# Patient Record
Sex: Female | Born: 1960 | Race: Black or African American | Hispanic: No | Marital: Single | Smoking: Former smoker
Health system: Southern US, Community
[De-identification: ages and names within clinical notes are randomized; demographics above are authoritative.]

## PROBLEM LIST (undated history)

## (undated) DIAGNOSIS — F410 Panic disorder [episodic paroxysmal anxiety] without agoraphobia: Secondary | ICD-10-CM

## (undated) DIAGNOSIS — Z923 Personal history of irradiation: Secondary | ICD-10-CM

## (undated) DIAGNOSIS — E119 Type 2 diabetes mellitus without complications: Secondary | ICD-10-CM

## (undated) DIAGNOSIS — M1990 Unspecified osteoarthritis, unspecified site: Secondary | ICD-10-CM

## (undated) DIAGNOSIS — F419 Anxiety disorder, unspecified: Secondary | ICD-10-CM

## (undated) DIAGNOSIS — D649 Anemia, unspecified: Secondary | ICD-10-CM

## (undated) DIAGNOSIS — K9089 Other intestinal malabsorption: Secondary | ICD-10-CM

## (undated) DIAGNOSIS — Z87442 Personal history of urinary calculi: Secondary | ICD-10-CM

## (undated) DIAGNOSIS — R001 Bradycardia, unspecified: Secondary | ICD-10-CM

## (undated) DIAGNOSIS — M5416 Radiculopathy, lumbar region: Secondary | ICD-10-CM

## (undated) DIAGNOSIS — M170 Bilateral primary osteoarthritis of knee: Secondary | ICD-10-CM

## (undated) DIAGNOSIS — J45909 Unspecified asthma, uncomplicated: Secondary | ICD-10-CM

## (undated) DIAGNOSIS — G5601 Carpal tunnel syndrome, right upper limb: Secondary | ICD-10-CM

## (undated) DIAGNOSIS — F32A Depression, unspecified: Secondary | ICD-10-CM

## (undated) DIAGNOSIS — K219 Gastro-esophageal reflux disease without esophagitis: Secondary | ICD-10-CM

## (undated) DIAGNOSIS — R51 Headache: Secondary | ICD-10-CM

## (undated) DIAGNOSIS — Z9221 Personal history of antineoplastic chemotherapy: Secondary | ICD-10-CM

## (undated) DIAGNOSIS — G8929 Other chronic pain: Secondary | ICD-10-CM

## (undated) DIAGNOSIS — F329 Major depressive disorder, single episode, unspecified: Secondary | ICD-10-CM

## (undated) DIAGNOSIS — K5792 Diverticulitis of intestine, part unspecified, without perforation or abscess without bleeding: Secondary | ICD-10-CM

## (undated) DIAGNOSIS — R519 Headache, unspecified: Secondary | ICD-10-CM

## (undated) DIAGNOSIS — M542 Cervicalgia: Secondary | ICD-10-CM

## (undated) DIAGNOSIS — K909 Intestinal malabsorption, unspecified: Secondary | ICD-10-CM

## (undated) DIAGNOSIS — M79643 Pain in unspecified hand: Secondary | ICD-10-CM

## (undated) HISTORY — DX: Depression, unspecified: F32.A

## (undated) HISTORY — DX: Pain in unspecified hand: M79.643

## (undated) HISTORY — DX: Major depressive disorder, single episode, unspecified: F32.9

## (undated) HISTORY — DX: Type 2 diabetes mellitus without complications: E11.9

## (undated) HISTORY — DX: Unspecified asthma, uncomplicated: J45.909

## (undated) HISTORY — DX: Diverticulitis of intestine, part unspecified, without perforation or abscess without bleeding: K57.92

## (undated) HISTORY — DX: Other intestinal malabsorption: K90.89

## (undated) HISTORY — PX: PARTIAL HYSTERECTOMY: SHX80

## (undated) HISTORY — DX: Gastro-esophageal reflux disease without esophagitis: K21.9

## (undated) HISTORY — PX: TUBAL LIGATION: SHX77

## (undated) HISTORY — DX: Intestinal malabsorption, unspecified: K90.9

---

## 2003-11-03 ENCOUNTER — Emergency Department: Payer: Self-pay | Admitting: Unknown Physician Specialty

## 2003-11-04 ENCOUNTER — Emergency Department: Payer: Self-pay | Admitting: Emergency Medicine

## 2004-03-13 ENCOUNTER — Emergency Department: Payer: Self-pay | Admitting: Internal Medicine

## 2004-04-09 ENCOUNTER — Emergency Department: Payer: Self-pay | Admitting: Emergency Medicine

## 2004-10-14 ENCOUNTER — Emergency Department: Payer: Self-pay | Admitting: Emergency Medicine

## 2004-11-18 ENCOUNTER — Emergency Department: Payer: Self-pay | Admitting: Unknown Physician Specialty

## 2004-12-06 ENCOUNTER — Emergency Department: Payer: Self-pay | Admitting: Emergency Medicine

## 2005-02-23 ENCOUNTER — Emergency Department: Payer: Self-pay | Admitting: Emergency Medicine

## 2005-04-21 ENCOUNTER — Emergency Department: Payer: Self-pay | Admitting: Emergency Medicine

## 2005-07-14 ENCOUNTER — Emergency Department: Payer: Self-pay | Admitting: Emergency Medicine

## 2005-07-20 ENCOUNTER — Ambulatory Visit: Payer: Self-pay | Admitting: Nurse Practitioner

## 2005-09-24 ENCOUNTER — Emergency Department: Payer: Self-pay

## 2005-12-14 ENCOUNTER — Emergency Department: Payer: Self-pay | Admitting: Internal Medicine

## 2005-12-14 ENCOUNTER — Other Ambulatory Visit: Payer: Self-pay

## 2005-12-16 ENCOUNTER — Ambulatory Visit: Payer: Self-pay | Admitting: Internal Medicine

## 2005-12-17 ENCOUNTER — Other Ambulatory Visit: Payer: Self-pay

## 2005-12-17 ENCOUNTER — Emergency Department: Payer: Self-pay | Admitting: Emergency Medicine

## 2006-11-26 ENCOUNTER — Ambulatory Visit: Payer: Self-pay | Admitting: Gastroenterology

## 2006-12-10 ENCOUNTER — Ambulatory Visit: Payer: Self-pay | Admitting: Unknown Physician Specialty

## 2007-01-18 ENCOUNTER — Ambulatory Visit: Payer: Self-pay | Admitting: Obstetrics and Gynecology

## 2007-01-31 ENCOUNTER — Inpatient Hospital Stay: Payer: Self-pay | Admitting: Obstetrics and Gynecology

## 2007-07-02 ENCOUNTER — Emergency Department: Payer: Self-pay | Admitting: Emergency Medicine

## 2007-08-20 ENCOUNTER — Emergency Department: Payer: Self-pay | Admitting: Emergency Medicine

## 2007-08-23 ENCOUNTER — Emergency Department: Payer: Self-pay | Admitting: Internal Medicine

## 2007-10-06 ENCOUNTER — Ambulatory Visit: Payer: Self-pay | Admitting: Internal Medicine

## 2007-10-06 ENCOUNTER — Emergency Department: Payer: Self-pay | Admitting: Emergency Medicine

## 2007-10-12 ENCOUNTER — Emergency Department: Payer: Self-pay | Admitting: Internal Medicine

## 2008-06-24 ENCOUNTER — Emergency Department: Payer: Self-pay | Admitting: Emergency Medicine

## 2008-07-19 ENCOUNTER — Emergency Department: Payer: Self-pay | Admitting: Emergency Medicine

## 2008-07-21 ENCOUNTER — Emergency Department: Payer: Self-pay | Admitting: Emergency Medicine

## 2009-01-07 ENCOUNTER — Emergency Department: Payer: Self-pay | Admitting: Emergency Medicine

## 2009-03-01 ENCOUNTER — Emergency Department: Payer: Self-pay | Admitting: Emergency Medicine

## 2009-07-27 ENCOUNTER — Emergency Department: Payer: Self-pay | Admitting: Emergency Medicine

## 2009-08-08 ENCOUNTER — Emergency Department: Payer: Self-pay | Admitting: Emergency Medicine

## 2009-11-06 ENCOUNTER — Emergency Department: Payer: Self-pay | Admitting: Emergency Medicine

## 2010-01-27 ENCOUNTER — Emergency Department: Payer: Self-pay | Admitting: Emergency Medicine

## 2010-03-26 ENCOUNTER — Emergency Department: Payer: Self-pay | Admitting: Emergency Medicine

## 2010-04-05 ENCOUNTER — Emergency Department: Payer: Self-pay | Admitting: Emergency Medicine

## 2010-04-19 ENCOUNTER — Emergency Department: Payer: Self-pay | Admitting: Unknown Physician Specialty

## 2010-08-10 ENCOUNTER — Emergency Department: Payer: Self-pay | Admitting: Unknown Physician Specialty

## 2010-10-06 ENCOUNTER — Emergency Department: Payer: Self-pay | Admitting: Unknown Physician Specialty

## 2010-11-16 ENCOUNTER — Emergency Department: Payer: Self-pay | Admitting: Unknown Physician Specialty

## 2011-04-27 ENCOUNTER — Emergency Department: Payer: Self-pay | Admitting: Emergency Medicine

## 2011-04-27 LAB — COMPREHENSIVE METABOLIC PANEL
Albumin: 3.4 g/dL (ref 3.4–5.0)
Alkaline Phosphatase: 87 U/L (ref 50–136)
Anion Gap: 9 (ref 7–16)
BUN: 11 mg/dL (ref 7–18)
Co2: 26 mmol/L (ref 21–32)
Creatinine: 0.78 mg/dL (ref 0.60–1.30)
EGFR (African American): >60
EGFR (Non-African Amer.): >60
Potassium: 4.4 mmol/L (ref 3.5–5.1)
SGPT (ALT): 18 U/L
Sodium: 142 mmol/L (ref 136–145)
Total Protein: 7.7 g/dL (ref 6.4–8.2)

## 2011-04-27 LAB — CBC
HCT: 37.5 % (ref 35.0–47.0)
HGB: 12.2 g/dL (ref 12.0–16.0)
MCH: 25.7 pg — ABNORMAL LOW (ref 26.0–34.0)
MCV: 79 fL — ABNORMAL LOW (ref 80–100)
RBC: 4.73 10*6/uL (ref 3.80–5.20)
RDW: 13.8 % (ref 11.5–14.5)
WBC: 5.5 10*3/uL (ref 3.6–11.0)

## 2011-04-27 LAB — URINALYSIS, COMPLETE
Bilirubin,UR: NEGATIVE
Nitrite: NEGATIVE
RBC,UR: 1 /HPF (ref 0–5)
Specific Gravity: 1.011 (ref 1.003–1.030)
Squamous Epithelial: 21
WBC UR: 7 /HPF (ref 0–5)

## 2011-04-27 LAB — WET PREP, GENITAL

## 2011-04-28 ENCOUNTER — Emergency Department: Payer: Self-pay | Admitting: Emergency Medicine

## 2011-04-28 LAB — URINALYSIS, COMPLETE
Bacteria: NONE SEEN
Bilirubin,UR: NEGATIVE
Blood: NEGATIVE
Glucose,UR: NEGATIVE mg/dL (ref 0–75)
Nitrite: NEGATIVE
Ph: 5 (ref 4.5–8.0)
Protein: NEGATIVE
RBC,UR: 7 /HPF (ref 0–5)
Specific Gravity: 1.026 (ref 1.003–1.030)
WBC UR: 1 /HPF (ref 0–5)

## 2011-04-28 LAB — CBC
HCT: 36.2 % (ref 35.0–47.0)
HGB: 12.1 g/dL (ref 12.0–16.0)
MCH: 26.6 pg (ref 26.0–34.0)
MCHC: 33.5 g/dL (ref 32.0–36.0)
MCV: 80 fL (ref 80–100)
Platelet: 262 10*3/uL (ref 150–440)
RBC: 4.55 10*6/uL (ref 3.80–5.20)

## 2011-04-28 LAB — COMPREHENSIVE METABOLIC PANEL
Alkaline Phosphatase: 84 U/L (ref 50–136)
Calcium, Total: 8.2 mg/dL — ABNORMAL LOW (ref 8.5–10.1)
Chloride: 108 mmol/L — ABNORMAL HIGH (ref 98–107)
Creatinine: 0.69 mg/dL (ref 0.60–1.30)
Osmolality: 282 (ref 275–301)
Potassium: 3.9 mmol/L (ref 3.5–5.1)
SGOT(AST): 37 U/L (ref 15–37)
SGPT (ALT): 27 U/L

## 2011-05-01 ENCOUNTER — Emergency Department: Payer: Self-pay | Admitting: Emergency Medicine

## 2011-05-01 LAB — CBC
HCT: 33.7 % — ABNORMAL LOW (ref 35.0–47.0)
HGB: 11.2 g/dL — ABNORMAL LOW (ref 12.0–16.0)
MCH: 26.5 pg (ref 26.0–34.0)
MCHC: 33.3 g/dL (ref 32.0–36.0)
MCV: 79 fL — ABNORMAL LOW (ref 80–100)
RBC: 4.24 10*6/uL (ref 3.80–5.20)
RDW: 14.9 % — ABNORMAL HIGH (ref 11.5–14.5)
WBC: 6.6 10*3/uL (ref 3.6–11.0)

## 2011-05-01 LAB — BASIC METABOLIC PANEL
Anion Gap: 10 (ref 7–16)
BUN: 10 mg/dL (ref 7–18)
Calcium, Total: 8.4 mg/dL — ABNORMAL LOW (ref 8.5–10.1)
Chloride: 107 mmol/L (ref 98–107)
Co2: 25 mmol/L (ref 21–32)
Creatinine: 0.82 mg/dL (ref 0.60–1.30)
EGFR (Non-African Amer.): >60
Osmolality: 282 (ref 275–301)

## 2011-05-01 LAB — CK TOTAL AND CKMB (NOT AT ARMC)
CK, Total: 242 U/L — ABNORMAL HIGH (ref 21–215)
CK-MB: 1.5 ng/mL (ref 0.5–3.6)

## 2011-05-01 LAB — PRO B NATRIURETIC PEPTIDE: B-Type Natriuretic Peptide: 34 pg/mL (ref 0–125)

## 2011-08-07 ENCOUNTER — Emergency Department: Payer: Self-pay | Admitting: Emergency Medicine

## 2012-08-13 ENCOUNTER — Emergency Department: Payer: Self-pay | Admitting: Emergency Medicine

## 2012-09-12 ENCOUNTER — Emergency Department: Payer: Self-pay | Admitting: Emergency Medicine

## 2012-09-12 LAB — URINALYSIS, COMPLETE
Blood: NEGATIVE
Glucose,UR: NEGATIVE mg/dL (ref 0–75)
Ketone: NEGATIVE
Specific Gravity: 1.035 (ref 1.003–1.030)
WBC UR: 2 /HPF (ref 0–5)

## 2012-09-12 LAB — COMPREHENSIVE METABOLIC PANEL
BUN: 9 mg/dL (ref 7–18)
Bilirubin,Total: 0.2 mg/dL (ref 0.2–1.0)
Chloride: 108 mmol/L — ABNORMAL HIGH (ref 98–107)
EGFR (African American): >60
Glucose: 112 mg/dL — ABNORMAL HIGH (ref 65–99)
Potassium: 3.3 mmol/L — ABNORMAL LOW (ref 3.5–5.1)
SGOT(AST): 23 U/L (ref 15–37)
Sodium: 140 mmol/L (ref 136–145)

## 2012-09-12 LAB — CBC
HCT: 34.6 % — ABNORMAL LOW (ref 35.0–47.0)
HGB: 11.7 g/dL — ABNORMAL LOW (ref 12.0–16.0)
MCHC: 34 g/dL (ref 32.0–36.0)
MCV: 78 fL — ABNORMAL LOW (ref 80–100)
RBC: 4.42 10*6/uL (ref 3.80–5.20)
RDW: 14.4 % (ref 11.5–14.5)

## 2012-09-12 LAB — LIPASE, BLOOD: Lipase: 208 U/L (ref 73–393)

## 2012-11-09 ENCOUNTER — Ambulatory Visit: Payer: Self-pay | Admitting: Family Medicine

## 2012-12-01 ENCOUNTER — Emergency Department: Payer: Self-pay | Admitting: Emergency Medicine

## 2012-12-01 LAB — CBC
HGB: 12.2 g/dL (ref 12.0–16.0)
Platelet: 241 10*3/uL (ref 150–440)
RBC: 4.57 10*6/uL (ref 3.80–5.20)
RDW: 14.4 % (ref 11.5–14.5)
WBC: 6.1 10*3/uL (ref 3.6–11.0)

## 2012-12-01 LAB — BASIC METABOLIC PANEL
Anion Gap: 5 — ABNORMAL LOW (ref 7–16)
BUN: 11 mg/dL (ref 7–18)
Calcium, Total: 8.9 mg/dL (ref 8.5–10.1)
Chloride: 109 mmol/L — ABNORMAL HIGH (ref 98–107)
Co2: 26 mmol/L (ref 21–32)
EGFR (African American): >60
Osmolality: 279 (ref 275–301)
Potassium: 3.8 mmol/L (ref 3.5–5.1)
Sodium: 140 mmol/L (ref 136–145)

## 2012-12-01 LAB — CK TOTAL AND CKMB (NOT AT ARMC)
CK, Total: 176 U/L (ref 21–215)
CK-MB: 1.4 ng/mL (ref 0.5–3.6)

## 2012-12-01 LAB — TROPONIN I
Troponin-I: <0.02 ng/mL
Troponin-I: <0.02 ng/mL

## 2013-03-18 ENCOUNTER — Emergency Department: Payer: Self-pay | Admitting: Emergency Medicine

## 2013-03-20 LAB — BETA STREP CULTURE(ARMC)

## 2013-04-01 ENCOUNTER — Emergency Department: Payer: Self-pay | Admitting: Emergency Medicine

## 2013-08-05 ENCOUNTER — Emergency Department: Payer: Self-pay | Admitting: Emergency Medicine

## 2013-08-05 LAB — COMPREHENSIVE METABOLIC PANEL
ALBUMIN: 3.4 g/dL (ref 3.4–5.0)
ALK PHOS: 97 U/L
ALT: 24 U/L (ref 12–78)
Anion Gap: 7 (ref 7–16)
BUN: 7 mg/dL (ref 7–18)
Bilirubin,Total: 0.4 mg/dL (ref 0.2–1.0)
CALCIUM: 9 mg/dL (ref 8.5–10.1)
CHLORIDE: 107 mmol/L (ref 98–107)
CREATININE: 0.68 mg/dL (ref 0.60–1.30)
Co2: 26 mmol/L (ref 21–32)
EGFR (African American): >60
GLUCOSE: 110 mg/dL — AB (ref 65–99)
Osmolality: 278 (ref 275–301)
Potassium: 3.9 mmol/L (ref 3.5–5.1)
SGOT(AST): <5 U/L — ABNORMAL LOW (ref 15–37)
Sodium: 140 mmol/L (ref 136–145)
Total Protein: 6.8 g/dL (ref 6.4–8.2)

## 2013-08-05 LAB — URINALYSIS, COMPLETE
Bilirubin,UR: NEGATIVE
Blood: NEGATIVE
Glucose,UR: NEGATIVE mg/dL (ref 0–75)
Ketone: NEGATIVE
Leukocyte Esterase: NEGATIVE
NITRITE: NEGATIVE
PH: 6 (ref 4.5–8.0)
Protein: NEGATIVE
Specific Gravity: 1.027 (ref 1.003–1.030)

## 2013-08-05 LAB — LIPASE, BLOOD: Lipase: 197 U/L (ref 73–393)

## 2013-08-05 LAB — CBC
HCT: 35.6 % (ref 35.0–47.0)
HGB: 11.7 g/dL — ABNORMAL LOW (ref 12.0–16.0)
MCH: 25.9 pg — AB (ref 26.0–34.0)
MCHC: 33 g/dL (ref 32.0–36.0)
MCV: 78 fL — AB (ref 80–100)
PLATELETS: 275 10*3/uL (ref 150–440)
RBC: 4.53 10*6/uL (ref 3.80–5.20)
RDW: 14.4 % (ref 11.5–14.5)
WBC: 7.3 10*3/uL (ref 3.6–11.0)

## 2013-08-06 LAB — URINE CULTURE

## 2014-01-09 ENCOUNTER — Ambulatory Visit: Payer: Self-pay | Admitting: Pain Medicine

## 2014-01-17 ENCOUNTER — Ambulatory Visit: Payer: Self-pay | Admitting: Pain Medicine

## 2014-02-20 ENCOUNTER — Ambulatory Visit: Payer: Self-pay | Admitting: Pain Medicine

## 2014-02-28 ENCOUNTER — Ambulatory Visit: Payer: Self-pay | Admitting: Pain Medicine

## 2014-03-29 ENCOUNTER — Ambulatory Visit: Payer: Self-pay | Admitting: Pain Medicine

## 2014-04-11 ENCOUNTER — Ambulatory Visit: Payer: Self-pay | Admitting: Pain Medicine

## 2014-04-23 ENCOUNTER — Emergency Department: Payer: Self-pay | Admitting: Emergency Medicine

## 2014-05-01 ENCOUNTER — Ambulatory Visit: Payer: Self-pay | Admitting: Pain Medicine

## 2014-05-07 ENCOUNTER — Ambulatory Visit
Admit: 2014-05-07 | Disposition: A | Discharge status: Home / Self Care | Payer: Self-pay | Attending: Pain Medicine | Admitting: Pain Medicine

## 2014-05-10 ENCOUNTER — Ambulatory Visit
Admit: 2014-05-10 | Disposition: A | Discharge status: Home / Self Care | Payer: Self-pay | Attending: Pain Medicine | Admitting: Pain Medicine

## 2014-05-24 ENCOUNTER — Emergency Department: Admit: 2014-05-24 | Disposition: A | Discharge status: Home / Self Care | Payer: Self-pay | Admitting: Internal Medicine

## 2014-05-27 ENCOUNTER — Emergency Department
Admit: 2014-05-27 | Disposition: A | Discharge status: Home / Self Care | Payer: Self-pay | Admitting: Emergency Medicine

## 2014-05-27 LAB — CBC WITH DIFFERENTIAL/PLATELET
BASOS ABS: 0 10*3/uL (ref 0.0–0.1)
Basophil %: 0.5 %
Eosinophil #: 0 10*3/uL (ref 0.0–0.7)
Eosinophil %: 0.4 %
HCT: 36.4 % (ref 35.0–47.0)
HGB: 11.9 g/dL — ABNORMAL LOW (ref 12.0–16.0)
LYMPHS ABS: 3 10*3/uL (ref 1.0–3.6)
Lymphocyte %: 33.2 %
MCH: 26.6 pg (ref 26.0–34.0)
MCHC: 32.8 g/dL (ref 32.0–36.0)
MCV: 81 fL (ref 80–100)
MONO ABS: 0.6 x10 3/mm (ref 0.2–0.9)
MONOS PCT: 6.5 %
NEUTROS ABS: 5.4 10*3/uL (ref 1.4–6.5)
Neutrophil %: 59.4 %
Platelet: 293 10*3/uL (ref 150–440)
RBC: 4.49 10*6/uL (ref 3.80–5.20)
RDW: 14.4 % (ref 11.5–14.5)
WBC: 9.2 10*3/uL (ref 3.6–11.0)

## 2014-05-27 LAB — BASIC METABOLIC PANEL
ANION GAP: 8 (ref 7–16)
BUN: 14 mg/dL
CALCIUM: 9 mg/dL
CHLORIDE: 105 mmol/L
Co2: 26 mmol/L
Creatinine: 1.09 mg/dL — ABNORMAL HIGH
EGFR (Non-African Amer.): 58 — ABNORMAL LOW
GLUCOSE: 104 mg/dL — AB
Potassium: 3.5 mmol/L
Sodium: 139 mmol/L

## 2014-06-06 ENCOUNTER — Other Ambulatory Visit: Payer: Self-pay | Admitting: Pain Medicine

## 2014-06-06 DIAGNOSIS — M545 Low back pain: Secondary | ICD-10-CM

## 2014-06-06 DIAGNOSIS — M5416 Radiculopathy, lumbar region: Secondary | ICD-10-CM

## 2014-06-07 ENCOUNTER — Encounter: Payer: Self-pay | Admitting: Pain Medicine

## 2014-06-13 ENCOUNTER — Ambulatory Visit: Admission: RE | Admit: 2014-06-13 | Payer: Medicare Other | Source: Ambulatory Visit

## 2015-11-05 ENCOUNTER — Encounter: Payer: Self-pay | Admitting: *Deleted

## 2015-11-05 ENCOUNTER — Emergency Department
Admission: EM | Admit: 2015-11-05 | Discharge: 2015-11-05 | Disposition: A | Discharge status: Home / Self Care | Payer: Medicare Other | Attending: Emergency Medicine | Admitting: Emergency Medicine

## 2015-11-05 DIAGNOSIS — Z7982 Long term (current) use of aspirin: Secondary | ICD-10-CM | POA: Insufficient documentation

## 2015-11-05 DIAGNOSIS — J069 Acute upper respiratory infection, unspecified: Secondary | ICD-10-CM | POA: Diagnosis not present

## 2015-11-05 DIAGNOSIS — N189 Chronic kidney disease, unspecified: Secondary | ICD-10-CM | POA: Diagnosis not present

## 2015-11-05 DIAGNOSIS — E1122 Type 2 diabetes mellitus with diabetic chronic kidney disease: Secondary | ICD-10-CM | POA: Diagnosis not present

## 2015-11-05 DIAGNOSIS — Z79899 Other long term (current) drug therapy: Secondary | ICD-10-CM | POA: Insufficient documentation

## 2015-11-05 DIAGNOSIS — I129 Hypertensive chronic kidney disease with stage 1 through stage 4 chronic kidney disease, or unspecified chronic kidney disease: Secondary | ICD-10-CM | POA: Insufficient documentation

## 2015-11-05 DIAGNOSIS — Z7984 Long term (current) use of oral hypoglycemic drugs: Secondary | ICD-10-CM | POA: Insufficient documentation

## 2015-11-05 DIAGNOSIS — J45909 Unspecified asthma, uncomplicated: Secondary | ICD-10-CM | POA: Diagnosis not present

## 2015-11-05 DIAGNOSIS — J029 Acute pharyngitis, unspecified: Secondary | ICD-10-CM | POA: Diagnosis present

## 2015-11-05 MED ORDER — PSEUDOEPH-BROMPHEN-DM 30-2-10 MG/5ML PO SYRP
5.0000 mL | ORAL_SOLUTION | Freq: Four times a day (QID) | ORAL | 0 refills | Status: DC | PRN
Start: 2015-11-05 — End: 2016-07-08

## 2015-11-05 MED ORDER — IBUPROFEN 600 MG PO TABS: 600.0000 mg | ORAL_TABLET | Freq: Three times a day (TID) | ORAL | 0 refills | Status: DC | PRN

## 2015-11-05 NOTE — ED Triage Notes (Signed)
Pt complains of sore throat, chills and aches for two days

## 2015-11-05 NOTE — ED Provider Notes (Signed)
[facility] Emergency Department Provider Note   ____________________________________________   First MD Initiated Contact with Patient 11/05/15 1243     (approximate)  I have reviewed the triage vital signs and the nursing notes.   HISTORY  Chief Complaint URI    HPI Vanessa Fisher is a 55 y.o. female patient complaining of sore throat chills and body aches for 2 days. Patient denies any fever at this complaint. Patient states she has a nonproductive cough. Patient believe for sore throat secondary to cough. Patient denies any nausea vomiting diarrhea. No palliative measures taken for this complaint.Patient rates the pain as a 5/10. Patient described her pain as "achy".   Past Medical History:  Diagnosis Date  . Asthma   . Bile acid malabsorption syndrome   . Chronic kidney disease   . Depression   . Diabetes mellitus without complication ([place])   . Diverticulitis   . Gastric reflux     There are no active problems to display for this patient.   Past Surgical History:  Procedure Laterality Date  . PARTIAL HYSTERECTOMY    . TUBAL LIGATION      Prior to Admission medications   Medication Sig Start Date End Date Taking? Authorizing Provider  furosemide (LASIX) 20 MG tablet Take 20 mg by mouth.   Yes Historical Provider, MD  albuterol (PROVENTIL HFA;VENTOLIN HFA) 108 (90 BASE) MCG/ACT inhaler Inhale 2 puffs into the lungs every 6 (six) hours as needed for wheezing or shortness of breath.    Historical Provider, MD  aspirin 81 MG tablet Take 81 mg by mouth daily.    Historical Provider, MD  brompheniramine-pseudoephedrine-DM 30-2-10 MG/5ML syrup Take 5 mLs by mouth 4 (four) times daily as needed. 11/05/15   Sable Feil, PA-C  cetirizine (ZYRTEC) 10 MG tablet Take 10 mg by mouth daily.    Historical Provider, MD  fluticasone (FLONASE) 50 MCG/ACT nasal spray Place 1 spray into both nostrils daily.    Historical Provider, MD  gabapentin  (NEURONTIN) 300 MG capsule Take 300 mg by mouth. Limit 1 tab po @@ bedtime for 3 days if tolerated then 1 tab po bid if tolerated    Historical Provider, MD  glimepiride (AMARYL) 4 MG tablet Take 4 mg by mouth daily with breakfast.    Historical Provider, MD  ibuprofen (ADVIL,MOTRIN) 600 MG tablet Take 1 tablet (600 mg total) by mouth every 8 (eight) hours as needed. 11/05/15   Sable Feil, PA-C  oxycodone (OXY-IR) 5 MG capsule Take 5 mg by mouth every 4 (four) hours as needed. Limit 1 tab po/bid-tid if tolerated    Historical Provider, MD  pantoprazole (PROTONIX) 40 MG tablet Take 40 mg by mouth daily.    Historical Provider, MD    Allergies Bc powder  [aspirin-salicylamide-caffeine]; Buspirone; Hydrocodone-acetaminophen; and Penicillin g  No family history on file.  Social History Social History  Substance Use Topics  . Smoking status: Never Smoker  . Smokeless tobacco: Not on file  . Alcohol use No    Review of Systems Constitutional: No fever/chills Eyes: No visual changes. ENT: Sore throat. Cardiovascular: Denies chest pain. Respiratory: Denies shortness of breath. Nonproductive cough Gastrointestinal: No abdominal pain.  No nausea, no vomiting.  No diarrhea.  No constipation. Genitourinary: Negative for dysuria. Musculoskeletal: Negative for back pain. Skin: Negative for rash. Neurological: Negative for headaches, focal weakness or numbness. Endocrine:Hypertension and diabetes Allergic/Immunilogical: See medication list  ____________________________________________   PHYSICAL EXAM:  VITAL SIGNS: ED  Triage Vitals [11/05/15 1236]  Enc Vitals Group     BP (!) 135/56     Pulse Rate 71     Resp 20     Temp 98.1 F (36.7 C)     Temp Source Oral     SpO2 99 %     Weight 210 lb (95.3 kg)     Height 5\' 7"  (1.702 m)     Head Circumference      Peak Flow      Pain Score      Pain Loc      Pain Edu?      Excl. in [place]?     Constitutional: Alert and oriented. Well  appearing and in no acute distress. Eyes: Conjunctivae are normal. PERRL. EOMI. Head: Atraumatic. Nose: No congestion/rhinnorhea. Mouth/Throat: Mucous membranes are moist.  Oropharynx non-erythematous. Neck: No stridor. No cervical spine tenderness to palpation. Hematological/Lymphatic/Immunilogical: No cervical lymphadenopathy. Cardiovascular: Normal rate, regular rhythm. Grossly normal heart sounds.  Good peripheral circulation. Respiratory: Normal respiratory effort.  No retractions. Lungs CTAB. Gastrointestinal: Soft and nontender. No distention. No abdominal bruits. No CVA tenderness. Musculoskeletal: No lower extremity tenderness nor edema.  No joint effusions. Neurologic:  Normal speech and language. No gross focal neurologic deficits are appreciated. No gait instability. Skin:  Skin is warm, dry and intact. No rash noted. Psychiatric: Mood and affect are normal. Speech and behavior are normal.  ____________________________________________   LABS (all labs ordered are listed, but only abnormal results are displayed)  Labs Reviewed - No data to display ____________________________________________  EKG   ____________________________________________  RADIOLOGY   ____________________________________________   PROCEDURES  Procedure(s) performed: None  Procedures  Critical Care performed: No  ____________________________________________   INITIAL IMPRESSION / ASSESSMENT AND PLAN / ED COURSE  Pertinent labs & imaging results that were available during my care of the patient were reviewed by me and considered in my medical decision making (see chart for details).  Upper respiratory infection. Patient given discharge care instructions. Patient given a prescription for [facility] felt DM and ibuprofen. Patient given a work note. Patient advised follow-up family doctor condition persists.  Clinical Course     ____________________________________________   FINAL  CLINICAL IMPRESSION(S) / ED DIAGNOSES  Final diagnoses:  Acute upper respiratory infection      NEW MEDICATIONS STARTED DURING THIS VISIT:  New Prescriptions   BROMPHENIRAMINE-PSEUDOEPHEDRINE-DM 30-2-10 MG/5ML SYRUP    Take 5 mLs by mouth 4 (four) times daily as needed.   IBUPROFEN (ADVIL,MOTRIN) 600 MG TABLET    Take 1 tablet (600 mg total) by mouth every 8 (eight) hours as needed.     Note:  This document was prepared using Dragon voice recognition software and may include unintentional dictation errors.    Sable Feil, PA-C 11/05/15 [place], MD 11/06/15 [phone]

## 2015-11-05 NOTE — ED Notes (Signed)
States she developed non prod cough  Sore throat and chills for about 2 -3 days   Afebrile on arrival

## 2016-01-07 ENCOUNTER — Emergency Department
Admission: EM | Admit: 2016-01-07 | Discharge: 2016-01-07 | Disposition: A | Discharge status: Home / Self Care | Payer: Medicare Other | Attending: Emergency Medicine | Admitting: Emergency Medicine

## 2016-01-07 ENCOUNTER — Encounter: Payer: Self-pay | Admitting: Emergency Medicine

## 2016-01-07 DIAGNOSIS — Z79899 Other long term (current) drug therapy: Secondary | ICD-10-CM | POA: Diagnosis not present

## 2016-01-07 DIAGNOSIS — Z7982 Long term (current) use of aspirin: Secondary | ICD-10-CM | POA: Insufficient documentation

## 2016-01-07 DIAGNOSIS — M545 Low back pain, unspecified: Secondary | ICD-10-CM

## 2016-01-07 DIAGNOSIS — J45909 Unspecified asthma, uncomplicated: Secondary | ICD-10-CM | POA: Insufficient documentation

## 2016-01-07 DIAGNOSIS — G8929 Other chronic pain: Secondary | ICD-10-CM | POA: Diagnosis not present

## 2016-01-07 DIAGNOSIS — N189 Chronic kidney disease, unspecified: Secondary | ICD-10-CM | POA: Diagnosis not present

## 2016-01-07 DIAGNOSIS — E1122 Type 2 diabetes mellitus with diabetic chronic kidney disease: Secondary | ICD-10-CM | POA: Insufficient documentation

## 2016-01-07 DIAGNOSIS — Z7984 Long term (current) use of oral hypoglycemic drugs: Secondary | ICD-10-CM | POA: Diagnosis not present

## 2016-01-07 MED ORDER — TRAMADOL HCL 50 MG PO TABS
50.0000 mg | ORAL_TABLET | Freq: Once | ORAL | Status: AC
  Administered 2016-01-07: 50 mg via ORAL
  Filled 2016-01-07: qty 1

## 2016-01-07 MED ORDER — CYCLOBENZAPRINE HCL 10 MG PO TABS: 10.0000 mg | ORAL_TABLET | Freq: Three times a day (TID) | ORAL | 0 refills | Status: DC | PRN

## 2016-01-07 MED ORDER — IBUPROFEN 600 MG PO TABS
600.0000 mg | ORAL_TABLET | Freq: Once | ORAL | Status: AC
  Administered 2016-01-07: 600 mg via ORAL
  Filled 2016-01-07: qty 1

## 2016-01-07 MED ORDER — OXYCODONE-ACETAMINOPHEN 7.5-325 MG PO TABS: 1.0000 | ORAL_TABLET | Freq: Four times a day (QID) | ORAL | 0 refills | Status: DC | PRN

## 2016-01-07 MED ORDER — CYCLOBENZAPRINE HCL 10 MG PO TABS
10.0000 mg | ORAL_TABLET | Freq: Once | ORAL | Status: AC
  Administered 2016-01-07: 10 mg via ORAL
  Filled 2016-01-07: qty 1

## 2016-01-07 NOTE — ED Triage Notes (Signed)
Pt with c/o low back pain and cannot get into pain clinic until January.

## 2016-01-07 NOTE — ED Provider Notes (Signed)
[facility] Emergency Department Provider Note   ____________________________________________   First MD Initiated Contact with Patient 01/07/16 1203     (approximate)  I have reviewed the triage vital signs and the nursing notes.   HISTORY  Chief Complaint Back Pain    HPI Vanessa Fisher is a 55 y.o. female patient complaining of low back pain which is chronic in nature. Patient states she was followed by Dr. Gerald Stabs at the pain clinic until violated her parole and had to spend 6 months in jail. Patient recently released but cannot see pain management clinic until 02/19/2016. Patient state her family doctor is not authorized to prescribe pain medications. Patient states she is in the process of switching family doctors.  Past Medical History:  Diagnosis Date  . Asthma   . Bile acid malabsorption syndrome   . Chronic kidney disease   . Depression   . Diabetes mellitus without complication ([place])   . Diverticulitis   . Gastric reflux     There are no active problems to display for this patient.   Past Surgical History:  Procedure Laterality Date  . PARTIAL HYSTERECTOMY    . TUBAL LIGATION      Prior to Admission medications   Medication Sig Start Date End Date Taking? Authorizing Provider  albuterol (PROVENTIL HFA;VENTOLIN HFA) 108 (90 BASE) MCG/ACT inhaler Inhale 2 puffs into the lungs every 6 (six) hours as needed for wheezing or shortness of breath.    Historical Provider, MD  aspirin 81 MG tablet Take 81 mg by mouth daily.    Historical Provider, MD  brompheniramine-pseudoephedrine-DM 30-2-10 MG/5ML syrup Take 5 mLs by mouth 4 (four) times daily as needed. 11/05/15   Sable Feil, PA-C  cetirizine (ZYRTEC) 10 MG tablet Take 10 mg by mouth daily.    Historical Provider, MD  cyclobenzaprine (FLEXERIL) 10 MG tablet Take 1 tablet (10 mg total) by mouth 3 (three) times daily as needed. 01/07/16   Sable Feil, PA-C  fluticasone (FLONASE) 50  MCG/ACT nasal spray Place 1 spray into both nostrils daily.    Historical Provider, MD  furosemide (LASIX) 20 MG tablet Take 20 mg by mouth.    Historical Provider, MD  gabapentin (NEURONTIN) 300 MG capsule Take 300 mg by mouth. Limit 1 tab po @@ bedtime for 3 days if tolerated then 1 tab po bid if tolerated    Historical Provider, MD  glimepiride (AMARYL) 4 MG tablet Take 4 mg by mouth daily with breakfast.    Historical Provider, MD  ibuprofen (ADVIL,MOTRIN) 600 MG tablet Take 1 tablet (600 mg total) by mouth every 8 (eight) hours as needed. 11/05/15   Sable Feil, PA-C  oxycodone (OXY-IR) 5 MG capsule Take 5 mg by mouth every 4 (four) hours as needed. Limit 1 tab po/bid-tid if tolerated    Historical Provider, MD  oxyCODONE-acetaminophen (PERCOCET) 7.5-325 MG tablet Take 1 tablet by mouth every 6 (six) hours as needed for severe pain. 01/07/16   Sable Feil, PA-C  pantoprazole (PROTONIX) 40 MG tablet Take 40 mg by mouth daily.    Historical Provider, MD    Allergies   No family history on file.  Social History Social History  Substance Use Topics  . Smoking status: Never Smoker  . Smokeless tobacco: Never Used  . Alcohol use No    Review of Systems Constitutional: No fever/chills Eyes: No visual changes. ENT: No sore throat. Cardiovascular: Denies chest pain. Respiratory: Denies shortness of  breath. Gastrointestinal: No abdominal pain.  No nausea, no vomiting.  No diarrhea.  No constipation. Genitourinary: Negative for dysuria. Musculoskeletal:Chronic back pain Skin: Negative for rash. Neurological: Negative for headaches, focal weakness or numbness. Psychiatric:Depression Endocrine:Diabetes ____________________________________________   PHYSICAL EXAM:  VITAL SIGNS: ED Triage Vitals [01/07/16 1126]  Enc Vitals Group     BP 99/78     Pulse Rate (!) 58     Resp 18     Temp 97.9 F (36.6 C)     Temp Source Oral     SpO2 97 %     Weight 240 lb (108.9 kg)      Height 5\' 7"  (1.702 m)     Head Circumference      Peak Flow      Pain Score 10     Pain Loc      Pain Edu?      Excl. in [place]?     Constitutional: Alert and oriented. Well appearing and in no acute distress. Eyes: Conjunctivae are normal. PERRL. EOMI. Head: Atraumatic. Nose: No congestion/rhinnorhea. Mouth/Throat: Mucous membranes are moist.  Oropharynx non-erythematous. Neck: No stridor.  No cervical spine tenderness to palpation. Hematological/Lymphatic/Immunilogical: No cervical lymphadenopathy. Cardiovascular: Normal rate, regular rhythm. Grossly normal heart sounds.  Good peripheral circulation. Respiratory: Normal respiratory effort.  No retractions. Lungs CTAB. Gastrointestinal: Soft and nontender. No distention. No abdominal bruits. No CVA tenderness. Musculoskeletal: No obvious deformity to the lumbar spine. Patient had moderate guarding with palpation L3-L5. Patient decreased range of motion with flexion. Patient has bilateral negative straight leg test. No lower extremity tenderness nor edema.  No joint effusions. Neurologic:  Normal speech and language. No gross focal neurologic deficits are appreciated. No gait instability. Skin:  Skin is warm, dry and intact. No rash noted. Psychiatric: Mood and affect are normal. Speech and behavior are normal.  ____________________________________________   LABS (all labs ordered are listed, but only abnormal results are displayed)  Labs Reviewed - No data to display ____________________________________________  EKG   ____________________________________________  RADIOLOGY  Review of patient's MRI 3 years ago showed moderate degenerative changes of the spine. ____________________________________________   PROCEDURES  Procedure(s) performed: None  Procedures  Critical Care performed: No  ____________________________________________   INITIAL IMPRESSION / ASSESSMENT AND PLAN / ED COURSE  Pertinent labs & imaging  results that were available during my care of the patient were reviewed by me and considered in my medical decision making (see chart for details).  Chronic back pain. Patient given discharge care instructions. Patient given prescription for Percocets for 3 days.  Clinical Course    Discussed with patient the emergency room will not be able to prescribe her chronic narcotic pain medication for this condition. Patient advised to seek family doctor prescribed her medication until she is evaluated by pain management clinic.  ____________________________________________   FINAL CLINICAL IMPRESSION(S) / ED DIAGNOSES  Final diagnoses:  Chronic bilateral low back pain without sciatica      NEW MEDICATIONS STARTED DURING THIS VISIT:  New Prescriptions   CYCLOBENZAPRINE (FLEXERIL) 10 MG TABLET    Take 1 tablet (10 mg total) by mouth 3 (three) times daily as needed.   OXYCODONE-ACETAMINOPHEN (PERCOCET) 7.5-325 MG TABLET    Take 1 tablet by mouth every 6 (six) hours as needed for severe pain.     Note:  This document was prepared using Dragon voice recognition software and may include unintentional dictation errors.    Sable Feil, PA-C 01/07/16 1223    Shanon Brow  Talmadge Chad, MD 01/08/16 1352

## 2016-01-07 NOTE — ED Notes (Signed)
See triage note  States she is here for some pain meds d/t chronic back pain   Scheduled to see pain clinic in [place]

## 2016-03-14 ENCOUNTER — Emergency Department
Admission: EM | Admit: 2016-03-14 | Discharge: 2016-03-14 | Disposition: A | Discharge status: Home / Self Care | Payer: Medicare Other | Attending: Emergency Medicine | Admitting: Emergency Medicine

## 2016-03-14 ENCOUNTER — Encounter: Payer: Self-pay | Admitting: Emergency Medicine

## 2016-03-14 DIAGNOSIS — N189 Chronic kidney disease, unspecified: Secondary | ICD-10-CM | POA: Diagnosis not present

## 2016-03-14 DIAGNOSIS — Z7982 Long term (current) use of aspirin: Secondary | ICD-10-CM | POA: Insufficient documentation

## 2016-03-14 DIAGNOSIS — M25561 Pain in right knee: Secondary | ICD-10-CM | POA: Diagnosis present

## 2016-03-14 DIAGNOSIS — J45909 Unspecified asthma, uncomplicated: Secondary | ICD-10-CM | POA: Insufficient documentation

## 2016-03-14 DIAGNOSIS — E1122 Type 2 diabetes mellitus with diabetic chronic kidney disease: Secondary | ICD-10-CM | POA: Insufficient documentation

## 2016-03-14 DIAGNOSIS — Z7984 Long term (current) use of oral hypoglycemic drugs: Secondary | ICD-10-CM | POA: Insufficient documentation

## 2016-03-14 MED ORDER — ETODOLAC 500 MG PO TABS: 500.0000 mg | ORAL_TABLET | Freq: Two times a day (BID) | ORAL | 0 refills | Status: DC

## 2016-03-14 NOTE — ED Notes (Signed)
Pt reports pain to right knee for 2 days; denies injury; reports chronic arthritis;

## 2016-03-14 NOTE — ED Provider Notes (Signed)
[facility] Provider Note   CSN: [id number] Arrival date & time: 03/14/16  1714     History   Chief Complaint Chief Complaint  Patient presents with  . Knee Pain    HPI Vanessa Fisher is a 56 y.o. female resents to the emergency department for evaluation of nontraumatic right knee pain. Patient has had a history of knee pain in the past, responded well to a cortisone injection one year ago. She denies any recent trauma or injury. Patient states since Friday she's had a sharp pain that is 10 out of 10 on the medial joint line of her right knee that is present at nighttime when she is trying to sleep as well as when she first gets out of the bed in the morning. Pain improves with ambulation. She took 1 Aleve tablet earlier today with no improvement. She denies any kidney disease has tolerated NSAIDs well the past. She does not have an orthopedic doctor. She denies any giving away sensation. No back pain hip pain or leg swelling.  HPI  Past Medical History:  Diagnosis Date  . Asthma   . Bile acid malabsorption syndrome   . Chronic kidney disease   . Depression   . Diabetes mellitus without complication ([place])   . Diverticulitis   . Gastric reflux     There are no active problems to display for this patient.   Past Surgical History:  Procedure Laterality Date  . PARTIAL HYSTERECTOMY    . TUBAL LIGATION      OB History    No data available       Home Medications    Prior to Admission medications   Medication Sig Start Date End Date Taking? Authorizing Provider  albuterol (PROVENTIL HFA;VENTOLIN HFA) 108 (90 BASE) MCG/ACT inhaler Inhale 2 puffs into the lungs every 6 (six) hours as needed for wheezing or shortness of breath.    Historical Provider, MD  aspirin 81 MG tablet Take 81 mg by mouth daily.    Historical Provider, MD  brompheniramine-pseudoephedrine-DM 30-2-10 MG/5ML syrup Take 5 mLs by mouth 4 (four) times daily as needed. 11/05/15   Sable Feil, PA-C  cetirizine (ZYRTEC) 10 MG tablet Take 10 mg by mouth daily.    Historical Provider, MD  cyclobenzaprine (FLEXERIL) 10 MG tablet Take 1 tablet (10 mg total) by mouth 3 (three) times daily as needed. 01/07/16   Sable Feil, PA-C  etodolac (LODINE) 500 MG tablet Take 1 tablet (500 mg total) by mouth 2 (two) times daily. 03/14/16   Duanne Guess, PA-C  fluticasone (FLONASE) 50 MCG/ACT nasal spray Place 1 spray into both nostrils daily.    Historical Provider, MD  furosemide (LASIX) 20 MG tablet Take 20 mg by mouth.    Historical Provider, MD  gabapentin (NEURONTIN) 300 MG capsule Take 300 mg by mouth. Limit 1 tab po @@ bedtime for 3 days if tolerated then 1 tab po bid if tolerated    Historical Provider, MD  glimepiride (AMARYL) 4 MG tablet Take 4 mg by mouth daily with breakfast.    Historical Provider, MD  oxycodone (OXY-IR) 5 MG capsule Take 5 mg by mouth every 4 (four) hours as needed. Limit 1 tab po/bid-tid if tolerated    Historical Provider, MD  oxyCODONE-acetaminophen (PERCOCET) 7.5-325 MG tablet Take 1 tablet by mouth every 6 (six) hours as needed for severe pain. 01/07/16   Sable Feil, PA-C  pantoprazole (PROTONIX) 40 MG tablet Take 40 mg  by mouth daily.    Historical Provider, MD    Family History History reviewed. No pertinent family history.  Social History Social History  Substance Use Topics  . Smoking status: Never Smoker  . Smokeless tobacco: Never Used  . Alcohol use No     Allergies   Bc powder  [aspirin-salicylamide-caffeine]; Buspirone; Hydrocodone-acetaminophen; and Penicillin g   Review of Systems Review of Systems  Constitutional: Negative for activity change, chills, fatigue and fever.  HENT: Negative for congestion, sinus pressure and sore throat.   Eyes: Negative for visual disturbance.  Respiratory: Negative for cough, chest tightness and shortness of breath.   Cardiovascular: Negative for chest pain and leg swelling.  Gastrointestinal:  Negative for abdominal pain, diarrhea, nausea and vomiting.  Genitourinary: Negative for dysuria.  Musculoskeletal: Positive for arthralgias. Negative for gait problem.  Skin: Negative for rash.  Neurological: Negative for weakness, numbness and headaches.  Hematological: Negative for adenopathy.  Psychiatric/Behavioral: Negative for agitation, behavioral problems and confusion.     Physical Exam Updated Vital Signs BP 134/68 (BP Location: Right Arm)   Pulse 77   Temp 97.8 F (36.6 C) (Oral)   Resp 18   Ht 5\' 7"  (1.702 m)   Wt 108.9 kg   SpO2 99%   BMI 37.59 kg/m   Physical Exam  Constitutional: She appears well-developed and well-nourished. No distress.  HENT:  Head: Normocephalic and atraumatic.  Eyes: Conjunctivae are normal.  Neck: Normal range of motion. Neck supple.  Cardiovascular: Normal rate and regular rhythm.   No murmur heard. Pulmonary/Chest: Effort normal and breath sounds normal. No respiratory distress.  Abdominal: Soft. There is no tenderness.  Musculoskeletal:  Right Lower Extremities: Examination of the right lower extremity reveals no bony abnormality, no edema, no effusion and no ecchymosis.  There is mild valgus abnormality.  The patient is non-tender along the lateral joint line, and is point along the medial joint line.  The patient has 0-95 range of motion, slightly decreased when compared to the left knee.  The patient has a painful rotational Mcmurray test.  There is no retropatellar discomfort.  The patient has a negative patella stretch test.  The patient has a negative varus stress test and a negative valgus stress test, in looking for stability.  The patient has a negative Lachman's test.  Vascular: The patient has a negative Bevelyn Buckles' test bilaterally.  The patient had a normal dorsalis pedis and posterior tibial pulse.  There is normal skin warmth.  There is normal capillary refill bilaterally.    Neurologic: The patient has a negative straight  leg raise.  The patient has normal muscle strength testing for the quadriceps, calves, ankle dorsiflexion, ankle plantarflexion, and extensor hallicus longus.  The patient has sensation that is intact to light touch.    Neurological: She is alert.  Skin: Skin is warm and dry.  Psychiatric: She has a normal mood and affect.  Nursing note and vitals reviewed.    ED Treatments / Results  Labs (all labs ordered are listed, but only abnormal results are displayed) Labs Reviewed - No data to display  EKG  EKG Interpretation None       Radiology No results found.  Procedures Procedures (including critical care time)  Medications Ordered in ED Medications - No data to display   Initial Impression / Assessment and Plan / ED Course  I have reviewed the triage vital signs and the nursing notes.  Pertinent labs & imaging results that were available during  my care of the patient were reviewed by me and considered in my medical decision making (see chart for details).     55 year old female with 24 hour history of right knee pain along the medial joint line. No trauma or injury. Pain appears to be arthritic and non-mechanical. She gets relief with ambulation. No swelling throughout both lower extremities. We'll start etodolac 500 mg twice a day and follow-up with orthopedics Monday morning via telephone call. She is educated on signs and symptoms return to the emergency department for  Final Clinical Impressions(s) / ED Diagnoses   Final diagnoses:  Acute pain of right knee    New Prescriptions New Prescriptions   ETODOLAC (LODINE) 500 MG TABLET    Take 1 tablet (500 mg total) by mouth 2 (two) times daily.     Duanne Guess, PA-C 03/14/16 1921    Earleen Newport, MD 03/14/16 2030

## 2016-03-14 NOTE — ED Triage Notes (Signed)
Pt arrived via POV with right knee pain that started yesterday. Pt states the pain feels better when she is bearing weight. Describes pain as sharp.  Denies any fall or injury to knee. Has hx of knee joint effusion and has had to be drained in the past. Was going to see Dr. Primus Bravo for drainage, but has not gone in over a year. Tried wearing a knee brace but no relief.

## 2016-03-14 NOTE — Discharge Instructions (Signed)
Please rest ice and elevate the knee. Return to the ER for any worsening pain, any swelling that develops in the leg, fevers, worsening symptoms urgent changes in her health. Call orthopedics Monday morning to schedule follow-up appointment.

## 2016-05-10 ENCOUNTER — Encounter: Payer: Self-pay | Admitting: Emergency Medicine

## 2016-05-10 ENCOUNTER — Emergency Department
Admission: EM | Admit: 2016-05-10 | Discharge: 2016-05-10 | Disposition: A | Discharge status: Home / Self Care | Payer: Medicare Other | Attending: Emergency Medicine | Admitting: Emergency Medicine

## 2016-05-10 DIAGNOSIS — N189 Chronic kidney disease, unspecified: Secondary | ICD-10-CM | POA: Diagnosis not present

## 2016-05-10 DIAGNOSIS — E1122 Type 2 diabetes mellitus with diabetic chronic kidney disease: Secondary | ICD-10-CM | POA: Insufficient documentation

## 2016-05-10 DIAGNOSIS — J45909 Unspecified asthma, uncomplicated: Secondary | ICD-10-CM | POA: Insufficient documentation

## 2016-05-10 DIAGNOSIS — G8929 Other chronic pain: Secondary | ICD-10-CM | POA: Diagnosis not present

## 2016-05-10 DIAGNOSIS — M25561 Pain in right knee: Secondary | ICD-10-CM

## 2016-05-10 DIAGNOSIS — M1711 Unilateral primary osteoarthritis, right knee: Secondary | ICD-10-CM | POA: Diagnosis not present

## 2016-05-10 MED ORDER — ETODOLAC 400 MG PO TABS: 400.0000 mg | ORAL_TABLET | Freq: Two times a day (BID) | ORAL | 0 refills | Status: DC

## 2016-05-10 NOTE — ED Triage Notes (Signed)
Pt c/o R sided knee pain, states was seen here "approx 3 months ago and they gave [her] steroids", pt states the pain has started back and would like an X-ray.

## 2016-05-10 NOTE — Discharge Instructions (Signed)
Your symptoms are consistent with arthritis in the knee. Take the prescription anti-inflammatory daily, as directed. Schedule an appointment for follow-up with Dr. Mack Guise for further treatment, including possible steroid injection.

## 2016-05-10 NOTE — ED Provider Notes (Signed)
[facility] Emergency Department Provider Note ____________________________________________  Time seen: 1205  I have reviewed the triage vital signs and the nursing notes.  HISTORY  Chief Complaint  Knee Pain  HPI Vanessa Fisher is a 56 y.o. female presents to the ED for evaluation of ongoing right knee pain. She describes she was here about 3 months prior for similar complaint in week gave her "steroids.". She describes that the medicine as prescribed and when the pills ran out, her pain returned. She admits that she did not follow up with her primary care provider related to the knee pain would she follow-up with orthopedics as referred. She denies any interim injury, trauma, or accident. 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 Brynda Greathouse, her PCP, that she has arthritis in her knees.  Past Medical History:  Diagnosis Date  . Asthma   . Bile acid malabsorption syndrome   . Chronic kidney disease   . Depression   . Diabetes mellitus without complication ([place])   . Diverticulitis   . Gastric reflux     There are no active problems to display for this patient.   Past Surgical History:  Procedure Laterality Date  . PARTIAL HYSTERECTOMY    . TUBAL LIGATION      Prior to Admission medications   Medication Sig Start Date End Date Taking? Authorizing Provider  albuterol (PROVENTIL HFA;VENTOLIN HFA) 108 (90 BASE) MCG/ACT inhaler Inhale 2 puffs into the lungs every 6 (six) hours as needed for wheezing or shortness of breath.    Historical Provider, MD  aspirin 81 MG tablet Take 81 mg by mouth daily.    Historical Provider, MD  brompheniramine-pseudoephedrine-DM 30-2-10 MG/5ML syrup Take 5 mLs by mouth 4 (four) times daily as needed. 11/05/15   Sable Feil, PA-C  cetirizine (ZYRTEC) 10 MG tablet Take 10 mg by mouth daily.    Historical Provider, MD  cyclobenzaprine (FLEXERIL) 10 MG tablet Take 1 tablet (10  mg total) by mouth 3 (three) times daily as needed. 01/07/16   Sable Feil, PA-C  etodolac (LODINE) 400 MG tablet Take 1 tablet (400 mg total) by mouth 2 (two) times daily. 05/10/16 05/10/17  Rikki Smestad V Bacon Damyra Luscher, PA-C  etodolac (LODINE) 500 MG tablet Take 1 tablet (500 mg total) by mouth 2 (two) times daily. 03/14/16   Duanne Guess, PA-C  fluticasone (FLONASE) 50 MCG/ACT nasal spray Place 1 spray into both nostrils daily.    Historical Provider, MD  furosemide (LASIX) 20 MG tablet Take 20 mg by mouth.    Historical Provider, MD  gabapentin (NEURONTIN) 300 MG capsule Take 300 mg by mouth. Limit 1 tab po @@ bedtime for 3 days if tolerated then 1 tab po bid if tolerated    Historical Provider, MD  glimepiride (AMARYL) 4 MG tablet Take 4 mg by mouth daily with breakfast.    Historical Provider, MD  oxycodone (OXY-IR) 5 MG capsule Take 5 mg by mouth every 4 (four) hours as needed. Limit 1 tab po/bid-tid if tolerated    Historical Provider, MD  oxyCODONE-acetaminophen (PERCOCET) 7.5-325 MG tablet Take 1 tablet by mouth every 6 (six) hours as needed for severe pain. 01/07/16   Sable Feil, PA-C  pantoprazole (PROTONIX) 40 MG tablet Take 40 mg by mouth daily.    Historical Provider, MD    Allergies Bc powder  [aspirin-salicylamide-caffeine]; Buspirone; Hydrocodone-acetaminophen; and Penicillin g  No family history on file.  Social History Social History  Substance Use Topics  . Smoking status: Never Smoker  . Smokeless tobacco: Never Used  . Alcohol use No    Review of Systems  Constitutional: Negative for fever. Cardiovascular: Negative for chest pain. Respiratory: Negative for shortness of breath. Musculoskeletal: Negative for back pain. Right knee pain as above. Skin: Negative for rash. Neurological: Negative for headaches, focal weakness or numbness. ____________________________________________  PHYSICAL EXAM:  VITAL SIGNS: ED Triage Vitals  Enc Vitals Group     BP 05/10/16  1116 119/77     Pulse Rate 05/10/16 1116 75     Resp 05/10/16 1116 18     Temp 05/10/16 1116 97.8 F (36.6 C)     Temp Source 05/10/16 1116 Oral     SpO2 05/10/16 1116 100 %     Weight 05/10/16 1116 240 lb (108.9 kg)     Height 05/10/16 1116 5\' 7"  (1.702 m)     Head Circumference --      Peak Flow --      Pain Score 05/10/16 1115 10     Pain Loc --      Pain Edu? --      Excl. in [place]? --     Constitutional: Alert and oriented. Well appearing and in no distress. Head: Normocephalic and atraumatic. Cardiovascular: Normal rate, regular rhythm. Normal distal pulses. Respiratory: Normal respiratory effort.  Musculoskeletal: Right knee without any obvious deformity, dislocation, or effusion. Patient with normal flexion and extension range of motion on the exam. No significant valgus or varus stress stresses appreciated. Normal patellar tracking without ballottement or laxity. No popliteal space fullness is noted. Nontender with normal range of motion in all extremities.  Neurologic:  Normal gait without ataxia. Normal speech and language. No gross focal neurologic deficits are appreciated. Skin:  Skin is warm, dry and intact. No rash noted. Psychiatric: Mood and affect are normal. Patient exhibits appropriate insight and judgment. ____________________________________________   RADIOLOGY  Deferred ____________________________________________  INITIAL IMPRESSION / ASSESSMENT AND PLAN / ED COURSE  Patient with a presentation consistent with chronic knee pain secondary to underlying primary osteoarthritis. She responded well to Lodine in the past. She will be discharged at this time with a prescription for Lodine dose at the rectum. She is further advised to follow with Dr. Brynda Greathouse for ongoing symptom management. She may also follow-up with Dr. Mack Guise, for further evaluation of any pain including potential evaluation for steroid injection. A work note is provided for 1 day as  requested. ____________________________________________  FINAL CLINICAL IMPRESSION(S) / ED DIAGNOSES  Final diagnoses:  Chronic pain of right knee  Primary osteoarthritis of right knee      Melvenia Needles, PA-C 05/10/16 1830    Harvest Dark, MD 05/11/16 2314

## 2016-05-12 DIAGNOSIS — M79643 Pain in unspecified hand: Secondary | ICD-10-CM

## 2016-05-12 DIAGNOSIS — M25561 Pain in right knee: Secondary | ICD-10-CM

## 2016-05-12 DIAGNOSIS — G8929 Other chronic pain: Secondary | ICD-10-CM | POA: Insufficient documentation

## 2016-05-12 DIAGNOSIS — M25562 Pain in left knee: Secondary | ICD-10-CM

## 2016-05-12 HISTORY — DX: Pain in unspecified hand: M79.643

## 2016-06-03 ENCOUNTER — Emergency Department
Admission: EM | Admit: 2016-06-03 | Discharge: 2016-06-03 | Disposition: A | Discharge status: Home / Self Care | Payer: Medicare Other | Attending: Emergency Medicine | Admitting: Emergency Medicine

## 2016-06-03 ENCOUNTER — Encounter: Payer: Self-pay | Admitting: Emergency Medicine

## 2016-06-03 DIAGNOSIS — J45909 Unspecified asthma, uncomplicated: Secondary | ICD-10-CM | POA: Diagnosis not present

## 2016-06-03 DIAGNOSIS — Z7984 Long term (current) use of oral hypoglycemic drugs: Secondary | ICD-10-CM | POA: Insufficient documentation

## 2016-06-03 DIAGNOSIS — G8929 Other chronic pain: Secondary | ICD-10-CM | POA: Diagnosis not present

## 2016-06-03 DIAGNOSIS — M545 Low back pain: Secondary | ICD-10-CM | POA: Diagnosis not present

## 2016-06-03 DIAGNOSIS — R300 Dysuria: Secondary | ICD-10-CM | POA: Diagnosis not present

## 2016-06-03 DIAGNOSIS — N189 Chronic kidney disease, unspecified: Secondary | ICD-10-CM | POA: Diagnosis not present

## 2016-06-03 DIAGNOSIS — Z79899 Other long term (current) drug therapy: Secondary | ICD-10-CM | POA: Insufficient documentation

## 2016-06-03 DIAGNOSIS — Z7982 Long term (current) use of aspirin: Secondary | ICD-10-CM | POA: Diagnosis not present

## 2016-06-03 DIAGNOSIS — E1122 Type 2 diabetes mellitus with diabetic chronic kidney disease: Secondary | ICD-10-CM | POA: Insufficient documentation

## 2016-06-03 LAB — URINALYSIS, COMPLETE (UACMP) WITH MICROSCOPIC
Bacteria, UA: NONE SEEN
Bilirubin Urine: NEGATIVE
GLUCOSE, UA: NEGATIVE mg/dL
Hgb urine dipstick: NEGATIVE
Ketones, ur: NEGATIVE mg/dL
Leukocytes, UA: NEGATIVE
NITRITE: NEGATIVE
PH: 7 (ref 5.0–8.0)
Protein, ur: NEGATIVE mg/dL
Specific Gravity, Urine: 1.026 (ref 1.005–1.030)

## 2016-06-03 MED ORDER — PHENAZOPYRIDINE HCL 100 MG PO TABS: 100.0000 mg | ORAL_TABLET | Freq: Three times a day (TID) | ORAL | 0 refills | Status: AC | PRN

## 2016-06-03 MED ORDER — LIDOCAINE 5 % EX PTCH
1.0000 | MEDICATED_PATCH | CUTANEOUS | Status: DC
  Administered 2016-06-03: 1 via TRANSDERMAL
  Filled 2016-06-03: qty 1

## 2016-06-03 MED ORDER — LIDOCAINE 5 % EX PTCH: 1.0000 | MEDICATED_PATCH | CUTANEOUS | 0 refills | Status: DC

## 2016-06-03 NOTE — ED Triage Notes (Signed)
Patient presents to the ED with dysuria x 1 week with urgency and frequency and chronic back pain.  Patient is in no obvious distress at this time.  Patient states she has been out of pain medication x 2 months.  Patient states, "I'm supposed to go to the pain clinic next month."  Patient reports she has been trying to drink water and cranberry juice but this has not relieved her dysuria.

## 2016-06-03 NOTE — ED Notes (Signed)
See triage note  States she developed some dysuria a few days ago  But also having increased lower back  States pain is moving into both hips  Ambulates with slight limp d/t increased pain  Scheduled to see pain clinic next month

## 2016-06-03 NOTE — ED Provider Notes (Signed)
[facility] Emergency Department Provider Note  ____________________________________________  Time seen: Approximately 8:32 AM  I have reviewed the triage vital signs and the nursing notes.   HISTORY  Chief Complaint Back Pain and Dysuria    HPI Vanessa Fisher is a 56 y.o. female that presents to the emergency department with chronic lower back pain that has worsened over the last week. Pain does not radiate from lower back. She is having some pain in the front of her thighs. Patient states that she has had a couple episodes of dysuria last couple days but this is not every time. Patient is urinating frequently and she estimates that she drinks over 1 gallon of water a day. She moved houses last week and was moving several boxes and heavy items. She thinks this is what has aggravated her back pain. She was taking oxycodone for pain but that was discontinued one month ago. She sees a doctor in [place] and gets injections in her back. She has appointment with pain management on June 6. Patient has diabetes and blood sugars are controlled. Her blood sugar this morning was 120. She has never had problems with her kidneys. She denies fever, shortness of breath, chest pain, nausea, vomiting, abdominal pain, vaginal discharge.   Past Medical History:  Diagnosis Date  . Asthma   . Bile acid malabsorption syndrome   . Chronic kidney disease   . Depression   . Diabetes mellitus without complication ([place])   . Diverticulitis   . Gastric reflux     There are no active problems to display for this patient.   Past Surgical History:  Procedure Laterality Date  . PARTIAL HYSTERECTOMY    . TUBAL LIGATION      Prior to Admission medications   Medication Sig Start Date End Date Taking? Authorizing Provider  albuterol (PROVENTIL HFA;VENTOLIN HFA) 108 (90 BASE) MCG/ACT inhaler Inhale 2 puffs into the lungs every 6 (six) hours as needed for wheezing or shortness of  breath.    Historical Provider, MD  aspirin 81 MG tablet Take 81 mg by mouth daily.    Historical Provider, MD  brompheniramine-pseudoephedrine-DM 30-2-10 MG/5ML syrup Take 5 mLs by mouth 4 (four) times daily as needed. 11/05/15   Sable Feil, PA-C  cetirizine (ZYRTEC) 10 MG tablet Take 10 mg by mouth daily.    Historical Provider, MD  cyclobenzaprine (FLEXERIL) 10 MG tablet Take 1 tablet (10 mg total) by mouth 3 (three) times daily as needed. 01/07/16   Sable Feil, PA-C  etodolac (LODINE) 400 MG tablet Take 1 tablet (400 mg total) by mouth 2 (two) times daily. 05/10/16 05/10/17  Jenise V Bacon Menshew, PA-C  etodolac (LODINE) 500 MG tablet Take 1 tablet (500 mg total) by mouth 2 (two) times daily. 03/14/16   Duanne Guess, PA-C  fluticasone (FLONASE) 50 MCG/ACT nasal spray Place 1 spray into both nostrils daily.    Historical Provider, MD  furosemide (LASIX) 20 MG tablet Take 20 mg by mouth.    Historical Provider, MD  gabapentin (NEURONTIN) 300 MG capsule Take 300 mg by mouth. Limit 1 tab po @@ bedtime for 3 days if tolerated then 1 tab po bid if tolerated    Historical Provider, MD  glimepiride (AMARYL) 4 MG tablet Take 4 mg by mouth daily with breakfast.    Historical Provider, MD  lidocaine (LIDODERM) 5 % Place 1 patch onto the skin daily. Remove & Discard patch within 12 hours or as directed  by MD 06/03/16   Laban Emperor, PA-C  oxycodone (OXY-IR) 5 MG capsule Take 5 mg by mouth every 4 (four) hours as needed. Limit 1 tab po/bid-tid if tolerated    Historical Provider, MD  oxyCODONE-acetaminophen (PERCOCET) 7.5-325 MG tablet Take 1 tablet by mouth every 6 (six) hours as needed for severe pain. 01/07/16   Sable Feil, PA-C  pantoprazole (PROTONIX) 40 MG tablet Take 40 mg by mouth daily.    Historical Provider, MD  phenazopyridine (PYRIDIUM) 100 MG tablet Take 1 tablet (100 mg total) by mouth 3 (three) times daily as needed for pain. 06/03/16 06/05/16  Laban Emperor, PA-C    Allergies Bc powder   [aspirin-salicylamide-caffeine]; Buspirone; Hydrocodone-acetaminophen; and Penicillin g  No family history on file.  Social History Social History  Substance Use Topics  . Smoking status: Never Smoker  . Smokeless tobacco: Never Used  . Alcohol use No     Review of Systems  Constitutional: No fever/chills ENT: No upper respiratory complaints. Cardiovascular: No chest pain. Respiratory: No SOB. Gastrointestinal: No abdominal pain.  No nausea, no vomiting.  Musculoskeletal: Positive for back pain. Skin: Negative for rash, abrasions, lacerations, ecchymosis. Neurological: Negative for headaches, numbness or tingling   ____________________________________________   PHYSICAL EXAM:  VITAL SIGNS: ED Triage Vitals  Enc Vitals Group     BP 06/03/16 0722 134/68     Pulse Rate 06/03/16 0722 74     Resp 06/03/16 0722 18     Temp 06/03/16 0722 98.1 F (36.7 C)     Temp Source 06/03/16 0722 Oral     SpO2 06/03/16 0722 99 %     Weight 06/03/16 0718 240 lb (108.9 kg)     Height 06/03/16 0718 5\' 7"  (1.702 m)     Head Circumference --      Peak Flow --      Pain Score 06/03/16 0718 10     Pain Loc --      Pain Edu? --      Excl. in [place]? --      Constitutional: Alert and oriented. Well appearing and in no acute distress. Eyes: Conjunctivae are normal. PERRL. EOMI. Head: Atraumatic. ENT:      Ears:      Nose: No congestion/rhinnorhea.      Mouth/Throat: Mucous membranes are moist.  Neck: No stridor.  Cardiovascular: Normal rate, regular rhythm.  Good peripheral circulation. Respiratory: Normal respiratory effort without tachypnea or retractions. Lungs CTAB. Good air entry to the bases with no decreased or absent breath sounds. Gastrointestinal: Bowel sounds 4 quadrants. Soft and nontender to palpation. No guarding or rigidity. No palpable masses. No distention. No CVA tenderness. Musculoskeletal: Full range of motion to all extremities. No gross deformities  appreciated. Neurologic:  Normal speech and language. No gross focal neurologic deficits are appreciated.  Skin:  Skin is warm, dry and intact. No rash noted.   ____________________________________________   LABS (all labs ordered are listed, but only abnormal results are displayed)  Labs Reviewed  URINALYSIS, COMPLETE (UACMP) WITH MICROSCOPIC - Abnormal; Notable for the following:       Result Value   Color, Urine YELLOW (*)    APPearance HAZY (*)    Squamous Epithelial / LPF 6-30 (*)    All other components within normal limits  URINE CULTURE   ____________________________________________  EKG   ____________________________________________  RADIOLOGY  No results found.  ____________________________________________    PROCEDURES  Procedure(s) performed:    Procedures    Medications  lidocaine (LIDODERM) 5 % 1 patch (1 patch Transdermal Patch Applied 06/03/16 0853)     ____________________________________________   INITIAL IMPRESSION / ASSESSMENT AND PLAN / ED COURSE  Pertinent labs & imaging results that were available during my care of the patient were reviewed by me and considered in my medical decision making (see chart for details).  Review of the [place] CSRS was performed in accordance of the [place] prior to dispensing any controlled drugs.   Patient's diagnosis is consistent with chronic low back pain. Vital signs and exam are reassuring. Patient has had a couple episodes of dysuria over the last couple of days but no indication of urinary tract infection on urinalysis. Urine will be sent for culture. Patient is going to continue increased fluid intake. Patient has chronic back pain and is being seen by ortho and has an appointment with pain management in one month. Back pain was aggravated by moving several heavy boxes one week ago. No indication for imaging at this time. Patient will follow up with orthopedics. Patient will be discharged home with  prescriptions for pyridium. and lidocaine patch. Patient is given ED precautions to return to the ED for any worsening or new symptoms.     ____________________________________________  FINAL CLINICAL IMPRESSION(S) / ED DIAGNOSES  Final diagnoses:  Chronic midline low back pain without sciatica  Dysuria      NEW MEDICATIONS STARTED DURING THIS VISIT:  Discharge Medication List as of 06/03/2016  8:43 AM    START taking these medications   Details  lidocaine (LIDODERM) 5 % Place 1 patch onto the skin daily. Remove & Discard patch within 12 hours or as directed by MD, Starting Wed 06/03/2016, Print    phenazopyridine (PYRIDIUM) 100 MG tablet Take 1 tablet (100 mg total) by mouth 3 (three) times daily as needed for pain., Starting Wed 06/03/2016, Until Fri 06/05/2016, Print            This chart was dictated using voice recognition software/Dragon. Despite best efforts to proofread, errors can occur which can change the meaning. Any change was purely unintentional.    Laban Emperor, PA-C 06/03/16 1006    Eula Listen, MD 06/03/16 [phone]

## 2016-06-04 LAB — URINE CULTURE

## 2016-06-11 ENCOUNTER — Emergency Department
Admission: EM | Admit: 2016-06-11 | Discharge: 2016-06-11 | Disposition: A | Discharge status: Home / Self Care | Payer: Medicare Other | Attending: Emergency Medicine | Admitting: Emergency Medicine

## 2016-06-11 ENCOUNTER — Emergency Department: Payer: Medicare Other

## 2016-06-11 DIAGNOSIS — J45909 Unspecified asthma, uncomplicated: Secondary | ICD-10-CM | POA: Insufficient documentation

## 2016-06-11 DIAGNOSIS — S161XXA Strain of muscle, fascia and tendon at neck level, initial encounter: Secondary | ICD-10-CM | POA: Insufficient documentation

## 2016-06-11 DIAGNOSIS — Z7984 Long term (current) use of oral hypoglycemic drugs: Secondary | ICD-10-CM | POA: Diagnosis not present

## 2016-06-11 DIAGNOSIS — Z79899 Other long term (current) drug therapy: Secondary | ICD-10-CM | POA: Diagnosis not present

## 2016-06-11 DIAGNOSIS — Z7982 Long term (current) use of aspirin: Secondary | ICD-10-CM | POA: Insufficient documentation

## 2016-06-11 DIAGNOSIS — Y92410 Unspecified street and highway as the place of occurrence of the external cause: Secondary | ICD-10-CM | POA: Diagnosis not present

## 2016-06-11 DIAGNOSIS — Y9389 Activity, other specified: Secondary | ICD-10-CM | POA: Insufficient documentation

## 2016-06-11 DIAGNOSIS — Y999 Unspecified external cause status: Secondary | ICD-10-CM | POA: Insufficient documentation

## 2016-06-11 DIAGNOSIS — S199XXA Unspecified injury of neck, initial encounter: Secondary | ICD-10-CM | POA: Diagnosis present

## 2016-06-11 DIAGNOSIS — S39012A Strain of muscle, fascia and tendon of lower back, initial encounter: Secondary | ICD-10-CM | POA: Insufficient documentation

## 2016-06-11 DIAGNOSIS — E1122 Type 2 diabetes mellitus with diabetic chronic kidney disease: Secondary | ICD-10-CM | POA: Diagnosis not present

## 2016-06-11 DIAGNOSIS — N189 Chronic kidney disease, unspecified: Secondary | ICD-10-CM | POA: Diagnosis not present

## 2016-06-11 MED ORDER — ETODOLAC 500 MG PO TABS: 500.0000 mg | ORAL_TABLET | Freq: Two times a day (BID) | ORAL | 0 refills | Status: DC

## 2016-06-11 MED ORDER — CYCLOBENZAPRINE HCL 10 MG PO TABS: 10.0000 mg | ORAL_TABLET | Freq: Three times a day (TID) | ORAL | 0 refills | Status: DC | PRN

## 2016-06-11 MED ORDER — ONDANSETRON 8 MG PO TBDP
8.0000 mg | ORAL_TABLET | Freq: Once | ORAL | Status: AC
  Administered 2016-06-11: 8 mg via ORAL
  Filled 2016-06-11: qty 1

## 2016-06-11 MED ORDER — ORPHENADRINE CITRATE 30 MG/ML IJ SOLN
60.0000 mg | Freq: Once | INTRAMUSCULAR | Status: AC
  Administered 2016-06-11: 60 mg via INTRAMUSCULAR
  Filled 2016-06-11: qty 2

## 2016-06-11 MED ORDER — KETOROLAC TROMETHAMINE 30 MG/ML IJ SOLN
30.0000 mg | Freq: Once | INTRAMUSCULAR | Status: AC
  Administered 2016-06-11: 30 mg via INTRAMUSCULAR
  Filled 2016-06-11: qty 1
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
## 2016-07-08 ENCOUNTER — Encounter: Payer: Self-pay | Admitting: Pain Medicine

## 2016-07-08 ENCOUNTER — Other Ambulatory Visit: Payer: Self-pay | Admitting: Pain Medicine

## 2016-07-08 ENCOUNTER — Ambulatory Visit (HOSPITAL_BASED_OUTPATIENT_CLINIC_OR_DEPARTMENT_OTHER): Payer: Medicare Other | Admitting: Pain Medicine

## 2016-07-08 ENCOUNTER — Ambulatory Visit
Admission: RE | Admit: 2016-07-08 | Discharge: 2016-07-08 | Disposition: A | Discharge status: Home / Self Care | Payer: Medicare Other | Source: Ambulatory Visit | Attending: Pain Medicine | Admitting: Pain Medicine

## 2016-07-08 VITALS — BP 112/50 | HR 72 | Temp 98.0°F | Resp 18 | Ht 67.0 in | Wt 240.0 lb

## 2016-07-08 DIAGNOSIS — M5412 Radiculopathy, cervical region: Secondary | ICD-10-CM

## 2016-07-08 DIAGNOSIS — M4722 Other spondylosis with radiculopathy, cervical region: Secondary | ICD-10-CM | POA: Insufficient documentation

## 2016-07-08 DIAGNOSIS — G894 Chronic pain syndrome: Secondary | ICD-10-CM

## 2016-07-08 DIAGNOSIS — M4692 Unspecified inflammatory spondylopathy, cervical region: Secondary | ICD-10-CM

## 2016-07-08 DIAGNOSIS — M545 Low back pain, unspecified: Secondary | ICD-10-CM | POA: Insufficient documentation

## 2016-07-08 DIAGNOSIS — M4317 Spondylolisthesis, lumbosacral region: Secondary | ICD-10-CM | POA: Insufficient documentation

## 2016-07-08 DIAGNOSIS — G8929 Other chronic pain: Secondary | ICD-10-CM | POA: Diagnosis not present

## 2016-07-08 DIAGNOSIS — M5010 Cervical disc disorder with radiculopathy, unspecified cervical region: Secondary | ICD-10-CM | POA: Diagnosis not present

## 2016-07-08 DIAGNOSIS — M4310 Spondylolisthesis, site unspecified: Secondary | ICD-10-CM

## 2016-07-08 DIAGNOSIS — M79604 Pain in right leg: Secondary | ICD-10-CM | POA: Diagnosis not present

## 2016-07-08 DIAGNOSIS — M5030 Other cervical disc degeneration, unspecified cervical region: Secondary | ICD-10-CM

## 2016-07-08 DIAGNOSIS — M47812 Spondylosis without myelopathy or radiculopathy, cervical region: Secondary | ICD-10-CM

## 2016-07-08 DIAGNOSIS — M47816 Spondylosis without myelopathy or radiculopathy, lumbar region: Secondary | ICD-10-CM | POA: Insufficient documentation

## 2016-07-08 DIAGNOSIS — M488X2 Other specified spondylopathies, cervical region: Secondary | ICD-10-CM | POA: Insufficient documentation

## 2016-07-08 DIAGNOSIS — M25561 Pain in right knee: Secondary | ICD-10-CM

## 2016-07-08 DIAGNOSIS — M170 Bilateral primary osteoarthritis of knee: Secondary | ICD-10-CM

## 2016-07-08 DIAGNOSIS — M4696 Unspecified inflammatory spondylopathy, lumbar region: Secondary | ICD-10-CM | POA: Diagnosis not present

## 2016-07-08 DIAGNOSIS — M5136 Other intervertebral disc degeneration, lumbar region: Secondary | ICD-10-CM

## 2016-07-08 DIAGNOSIS — R200 Anesthesia of skin: Secondary | ICD-10-CM

## 2016-07-08 DIAGNOSIS — M25562 Pain in left knee: Secondary | ICD-10-CM

## 2016-07-08 DIAGNOSIS — M542 Cervicalgia: Secondary | ICD-10-CM

## 2016-07-08 DIAGNOSIS — R202 Paresthesia of skin: Secondary | ICD-10-CM

## 2016-07-08 DIAGNOSIS — R209 Unspecified disturbances of skin sensation: Secondary | ICD-10-CM | POA: Insufficient documentation

## 2016-07-08 DIAGNOSIS — M51369 Other intervertebral disc degeneration, lumbar region without mention of lumbar back pain or lower extremity pain: Secondary | ICD-10-CM

## 2016-07-08 DIAGNOSIS — F1190 Opioid use, unspecified, uncomplicated: Secondary | ICD-10-CM

## 2016-07-08 DIAGNOSIS — Z79891 Long term (current) use of opiate analgesic: Secondary | ICD-10-CM

## 2016-07-08 NOTE — Progress Notes (Signed)
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 Spine Complete  . DG Lumbar Spine Complete W/Bend  . DG Knee 1-2 Views Left  . DG Knee 1-2 Views Right  . Compliance Drug Analysis, Ur  . Comprehensive metabolic panel  . C-reactive protein  . Magnesium  . Sedimentation rate  . Vitamin B12  . 25-Hydroxyvitamin D Lcms D2+D3  . Ambulatory referral to Psychology  . Ambulatory referral to Physical Therapy   Pharmacotherapy: Medications ordered:  No orders of the defined types were placed in this encounter.  Medications administered during this visit: Vanessa Fisher had no medications administered during this visit.   Pharmacotherapy under consideration:  Opioid Analgesics: The patient was informed that there is no guarantee that she would be a candidate for opioid analgesics. The decision will be made following CDC guidelines. This decision will be based on the results of diagnostic studies, as well as Vanessa Fisher risk profile.  Membrane  stabilizer: To be determined at a later time Muscle relaxant: To be determined at a later time NSAID: To be determined at a later time Other analgesic(s): To be determined at a later time   Interventional therapies under consideration: Vanessa Fisher was informed that there is no guarantee that she would be a candidate for interventional therapies. The decision will be based on the results of diagnostic studies, as well as Vanessa Fisher risk profile.  Possible procedure(s): Diagnostic bilateral lumbar facet block  Possible bilateral lumbar facet RFA  Diagnostic right L3-4 translaminar lumbar epidural steroid injection  Diagnostic right L3-4 transforaminal epidural steroid injection  Diagnostic right L4-5 transforaminal epidural steroid injection  Diagnostic right cervical epidural steroid injection  Diagnostic bilateral cervical facet block  Possible bilateral cervical facet RFA  Diagnostic bilateral intra-articular knee joint injection  Possible bilateral intra-articular Hyalgan knee injection series  Diagnostic bilateral Genicular nerve block  Possible bilateral Genicular nerve RFA    Provider-requested follow-up: Return for 2nd Visit, after [facility] eval.  No future appointments.  Primary Care Physician: Lorelee Market, MD Location: [facility] Note by: Kathlen Brunswick. Dossie Arbour, M.D, DABA, DABAPM, DABPM, DABIPP, FIPP Date: 07/08/2016; Time: 4:12 PM  Patient instructions provided during this appointment: Patient Instructions    ____________________________________________________________________________________________  Appointment Policy  It is our goal and responsibility to provide the medical community with assistance in the evaluation and management of patients with chronic pain. Unfortunately our resources are limited. Because we do not have an unlimited amount of time, or available appointments, we are required to closely monitor and  manage their use. The following rules exist to maximize their use:  Patient's responsibilities: 1. Punctuality: You are required to be physically present and registered in our facility at least 30 minutes before your appointment. 2. Tardiness: The cutoff is your appointment time. If you have an appointment scheduled for 10:00 AM and you arrive at 10:01, you will be required to reschedule your appointment.  3. Plan ahead: Always assume that you will encounter traffic on your way in. Plan for it. If you are dependent on a driver, make sure they understand these rules and the need to arrive early. 4. Other appointments and responsibilities: Avoid scheduling any other appointments before or after your pain clinic appointments.  5. Be prepared: Write down everything that you need to discuss with your healthcare provider and give this information to the admitting nurse. Write down the medications that you will need refilled. Bring your pills and bottles (even the empty ones), to all of your appointments, except for those where a procedure is scheduled. 6. No children or pets: Find someone to take care of them. It is not appropriate to bring them in. 7. Scheduling changes: We request "advanced notification" of any changes or cancellations. 8. Advanced notification: Defined as a time period of more than 24 hours prior to the originally scheduled appointment. This allows for the appointment to be offered to other patients. 9. Rescheduling: When a visit is rescheduled, it will require the cancellation of the original appointment. For this reason they both fall within the category of "Cancellations".  10. Cancellations: They require advanced notification. Any cancellation less than 24 hours before the  appointment will be recorded as a "No Show". 11. No Show: Defined as an unkept appointment where the patient failed to notify or declare to the practice their intention or inability to keep the  appointment.  Corrective process for repeat offenders:  1. Tardiness: Three (3)  episodes of rescheduling due to late arrivals will be recorded as one (1) "No Show". 2. Cancellation or reschedule: Three (3) cancellations or rescheduling will be recorded as one (1) "No Show". 3. "No Shows": Three (3) "No Shows" within a 12 month period will result in discharge from the practice.  ____________________________________________________________________________________________  ____________________________________________________________________________________________  Pain Scale  Introduction: The pain score used by this practice is the Verbal Numerical Rating Scale (VNRS-11). This is an 11-point scale. It is for adults and children 10 years or older. There are significant differences in how the pain score is reported, used, and applied. Forget everything you learned in the past and learn this scoring system.  General Information: The scale should reflect your current level of pain. Unless you are specifically asked for the level of your worst pain, or your average pain. If you are asked for one of these two, then it should be understood that it is over the past 24 hours.  Basic Activities of Daily Living (ADL): Personal hygiene, dressing, eating, transferring, and using restroom.  Instructions: Most patients tend to report their level of pain as a combination of two factors, their physical pain and their psychosocial pain. This last one is also known as "suffering" and it is reflection of how physical pain affects you socially and psychologically. From now on, report them separately. From this point on, when asked to report your pain level, report only your physical pain. Use the following table for reference.  Pain Clinic Pain Levels (0-5/10)  Pain Level Score  Description  No Pain 0   Mild pain 1 Nagging, annoying, but does not interfere with basic activities of daily living (ADL). Patients are  able to eat, bathe, get dressed, toileting (being able to get on and off the toilet and perform personal hygiene functions), transfer (move in and out of bed or a chair without assistance), and maintain continence (able to control bladder and bowel functions). Blood pressure and heart rate are unaffected. A normal heart rate for a healthy adult ranges from 60 to 100 bpm (beats per minute).   Mild to moderate pain 2 Noticeable and distracting. Impossible to hide from other people. More frequent flare-ups. Still possible to adapt and function close to normal. It can be very annoying and may have occasional stronger flare-ups. With discipline, patients may get used to it and adapt.   Moderate pain 3 Interferes significantly with activities of daily living (ADL). It becomes difficult to feed, bathe, get dressed, get on and off the toilet or to perform personal hygiene functions. Difficult to get in and out of bed or a chair without assistance. Very distracting. With effort, it can be ignored when deeply involved in activities.   Moderately severe pain 4 Impossible to ignore for more than a few minutes. With effort, patients may still be able to manage work or participate in some social activities. Very difficult to concentrate. Signs of autonomic nervous system discharge are evident: dilated pupils (mydriasis); mild sweating (diaphoresis); sleep interference. Heart rate becomes elevated (>115 bpm). Diastolic blood pressure (lower number) rises above 100 mmHg. Patients find relief in laying down and not moving.   Severe pain 5 Intense and extremely unpleasant. Associated with frowning face and frequent crying. Pain overwhelms the senses.  Ability to do any activity or maintain social relationships becomes significantly limited. Conversation becomes difficult. Pacing back and forth is common, as getting into a comfortable position is nearly impossible. Pain wakes you up from deep sleep. Physical signs  will be  obvious: pupillary dilation; increased sweating; goosebumps; brisk reflexes; cold, clammy hands and feet; nausea, vomiting or dry heaves; loss of appetite; significant sleep disturbance with inability to fall asleep or to remain asleep. When persistent, significant weight loss is observed due to the complete loss of appetite and sleep deprivation.  Blood pressure and heart rate becomes significantly elevated. Caution: If elevated blood pressure triggers a pounding headache associated with blurred vision, then the patient should immediately seek attention at an urgent or emergency care unit, as these may be signs of an impending stroke.    Emergency Department Pain Levels (6-10/10)  Emergency Room Pain 6 Severely limiting. Requires emergency care and should not be seen or managed at an outpatient pain management facility. Communication becomes difficult and requires great effort. Assistance to reach the emergency department may be required. Facial flushing and profuse sweating along with potentially dangerous increases in heart rate and blood pressure will be evident.   Distressing pain 7 Self-care is very difficult. Assistance is required to transport, or use restroom. Assistance to reach the emergency department will be required. Tasks requiring coordination, such as bathing and getting dressed become very difficult.   Disabling pain 8 Self-care is no longer possible. At this level, pain is disabling. The individual is unable to do even the most "basic" activities such as walking, eating, bathing, dressing, transferring to a bed, or toileting. Fine motor skills are lost. It is difficult to think clearly.   Incapacitating pain 9 Pain becomes incapacitating. Thought processing is no longer possible. Difficult to remember your own name. Control of movement and coordination are lost.   The worst pain imaginable 10 At this level, most patients pass out from pain. When this level is reached, collapse of the  autonomic nervous system occurs, leading to a sudden drop in blood pressure and heart rate. This in turn results in a temporary and dramatic drop in blood flow to the brain, leading to a loss of consciousness. Fainting is one of the body's self defense mechanisms. Passing out puts the brain in a calmed state and causes it to shut down for a while, in order to begin the healing process.    Summary: 1. Refer to this scale when providing [place] with your pain level. 2. Be accurate and careful when reporting your pain level. This will help with your care. 3. Over-reporting your pain level will lead to loss of credibility. 4. Even a level of 1/10 means that there is pain and will be treated at our facility. 5. High, inaccurate reporting will be documented as "Symptom Exaggeration", leading to loss of credibility and suspicions of possible secondary gains such as obtaining more narcotics, or wanting to appear disabled, for fraudulent reasons. 6. Only pain levels of 5 or below will be seen at our facility. 7. Pain levels of 6 and above will be sent to the Emergency Department and the appointment cancelled. ____________________________________________________________________________________________

## 2016-07-08 NOTE — Progress Notes (Signed)
Safety precautions to be maintained throughout the outpatient stay will include: orient to surroundings, keep bed in low position, maintain call bell within reach at all times, provide assistance with transfer out of bed and ambulation.  

## 2016-07-08 NOTE — Patient Instructions (Signed)
____________________________________________________________________________________________  Appointment Policy  It is our goal and responsibility to provide the medical community with assistance in the evaluation and management of patients with chronic pain. Unfortunately our resources are limited. Because we do not have an unlimited amount of time, or available appointments, we are required to closely monitor and manage their use. The following rules exist to maximize their use:  Patient's responsibilities: 1. Punctuality: You are required to be physically present and registered in our facility at least 30 minutes before your appointment. 2. Tardiness: The cutoff is your appointment time. If you have an appointment scheduled for 10:00 AM and you arrive at 10:01, you will be required to reschedule your appointment.  3. Plan ahead: Always assume that you will encounter traffic on your way in. Plan for it. If you are dependent on a driver, make sure they understand these rules and the need to arrive early. 4. Other appointments and responsibilities: Avoid scheduling any other appointments before or after your pain clinic appointments.  5. Be prepared: Write down everything that you need to discuss with your healthcare provider and give this information to the admitting nurse. Write down the medications that you will need refilled. Bring your pills and bottles (even the empty ones), to all of your appointments, except for those where a procedure is scheduled. 6. No children or pets: Find someone to take care of them. It is not appropriate to bring them in. 7. Scheduling changes: We request "advanced notification" of any changes or cancellations. 8. Advanced notification: Defined as a time period of more than 24 hours prior to the originally scheduled appointment. This allows for the appointment to be offered to other patients. 9. Rescheduling: When a visit is rescheduled, it will require the  cancellation of the original appointment. For this reason they both fall within the category of "Cancellations".  10. Cancellations: They require advanced notification. Any cancellation less than 24 hours before the  appointment will be recorded as a "No Show". 11. No Show: Defined as an unkept appointment where the patient failed to notify or declare to the practice their intention or inability to keep the appointment.  Corrective process for repeat offenders:  1. Tardiness: Three (3) episodes of rescheduling due to late arrivals will be recorded as one (1) "No Show". 2. Cancellation or reschedule: Three (3) cancellations or rescheduling will be recorded as one (1) "No Show". 3. "No Shows": Three (3) "No Shows" within a 12 month period will result in discharge from the practice.  ____________________________________________________________________________________________  ____________________________________________________________________________________________  Pain Scale  Introduction: The pain score used by this practice is the Verbal Numerical Rating Scale (VNRS-11). This is an 11-point scale. It is for adults and children 10 years or older. There are significant differences in how the pain score is reported, used, and applied. Forget everything you learned in the past and learn this scoring system.  General Information: The scale should reflect your current level of pain. Unless you are specifically asked for the level of your worst pain, or your average pain. If you are asked for one of these two, then it should be understood that it is over the past 24 hours.  Basic Activities of Daily Living (ADL): Personal hygiene, dressing, eating, transferring, and using restroom.  Instructions: Most patients tend to report their level of pain as a combination of two factors, their physical pain and their psychosocial pain. This last one is also known as "suffering" and it is reflection of how  physical pain   affects you socially and psychologically. From now on, report them separately. From this point on, when asked to report your pain level, report only your physical pain. Use the following table for reference.  Pain Clinic Pain Levels (0-5/10)  Pain Level Score  Description  No Pain 0   Mild pain 1 Nagging, annoying, but does not interfere with basic activities of daily living (ADL). Patients are able to eat, bathe, get dressed, toileting (being able to get on and off the toilet and perform personal hygiene functions), transfer (move in and out of bed or a chair without assistance), and maintain continence (able to control bladder and bowel functions). Blood pressure and heart rate are unaffected. A normal heart rate for a healthy adult ranges from 60 to 100 bpm (beats per minute).   Mild to moderate pain 2 Noticeable and distracting. Impossible to hide from other people. More frequent flare-ups. Still possible to adapt and function close to normal. It can be very annoying and may have occasional stronger flare-ups. With discipline, patients may get used to it and adapt.   Moderate pain 3 Interferes significantly with activities of daily living (ADL). It becomes difficult to feed, bathe, get dressed, get on and off the toilet or to perform personal hygiene functions. Difficult to get in and out of bed or a chair without assistance. Very distracting. With effort, it can be ignored when deeply involved in activities.   Moderately severe pain 4 Impossible to ignore for more than a few minutes. With effort, patients may still be able to manage work or participate in some social activities. Very difficult to concentrate. Signs of autonomic nervous system discharge are evident: dilated pupils (mydriasis); mild sweating (diaphoresis); sleep interference. Heart rate becomes elevated (>115 bpm). Diastolic blood pressure (lower number) rises above 100 mmHg. Patients find relief in laying down and not  moving.   Severe pain 5 Intense and extremely unpleasant. Associated with frowning face and frequent crying. Pain overwhelms the senses.  Ability to do any activity or maintain social relationships becomes significantly limited. Conversation becomes difficult. Pacing back and forth is common, as getting into a comfortable position is nearly impossible. Pain wakes you up from deep sleep. Physical signs will be obvious: pupillary dilation; increased sweating; goosebumps; brisk reflexes; cold, clammy hands and feet; nausea, vomiting or dry heaves; loss of appetite; significant sleep disturbance with inability to fall asleep or to remain asleep. When persistent, significant weight loss is observed due to the complete loss of appetite and sleep deprivation.  Blood pressure and heart rate becomes significantly elevated. Caution: If elevated blood pressure triggers a pounding headache associated with blurred vision, then the patient should immediately seek attention at an urgent or emergency care unit, as these may be signs of an impending stroke.    Emergency Department Pain Levels (6-10/10)  Emergency Room Pain 6 Severely limiting. Requires emergency care and should not be seen or managed at an outpatient pain management facility. Communication becomes difficult and requires great effort. Assistance to reach the emergency department may be required. Facial flushing and profuse sweating along with potentially dangerous increases in heart rate and blood pressure will be evident.   Distressing pain 7 Self-care is very difficult. Assistance is required to transport, or use restroom. Assistance to reach the emergency department will be required. Tasks requiring coordination, such as bathing and getting dressed become very difficult.   Disabling pain 8 Self-care is no longer possible. At this level, pain is disabling. The individual is   unable to do even the most "basic" activities such as walking, eating, bathing,  dressing, transferring to a bed, or toileting. Fine motor skills are lost. It is difficult to think clearly.   Incapacitating pain 9 Pain becomes incapacitating. Thought processing is no longer possible. Difficult to remember your own name. Control of movement and coordination are lost.   The worst pain imaginable 10 At this level, most patients pass out from pain. When this level is reached, collapse of the autonomic nervous system occurs, leading to a sudden drop in blood pressure and heart rate. This in turn results in a temporary and dramatic drop in blood flow to the brain, leading to a loss of consciousness. Fainting is one of the body's self defense mechanisms. Passing out puts the brain in a calmed state and causes it to shut down for a while, in order to begin the healing process.    Summary: 1. Refer to this scale when providing us with your pain level. 2. Be accurate and careful when reporting your pain level. This will help with your care. 3. Over-reporting your pain level will lead to loss of credibility. 4. Even a level of 1/10 means that there is pain and will be treated at our facility. 5. High, inaccurate reporting will be documented as "Symptom Exaggeration", leading to loss of credibility and suspicions of possible secondary gains such as obtaining more narcotics, or wanting to appear disabled, for fraudulent reasons. 6. Only pain levels of 5 or below will be seen at our facility. 7. Pain levels of 6 and above will be sent to the Emergency Department and the appointment cancelled. ____________________________________________________________________________________________   

## 2016-07-13 LAB — 25-HYDROXYVITAMIN D LCMS D2+D3: 25-HYDROXY, VITAMIN D-3: 20 ng/mL

## 2016-07-13 LAB — COMPREHENSIVE METABOLIC PANEL
A/G RATIO: 1.3 (ref 1.2–2.2)
ALBUMIN: 4 g/dL (ref 3.5–5.5)
ALT: 17 IU/L (ref 0–32)
AST: 18 IU/L (ref 0–40)
Alkaline Phosphatase: 121 IU/L — ABNORMAL HIGH (ref 39–117)
BUN / CREAT RATIO: 10 (ref 9–23)
BUN: 10 mg/dL (ref 6–24)
Bilirubin Total: 0.4 mg/dL (ref 0.0–1.2)
CALCIUM: 9.8 mg/dL (ref 8.7–10.2)
CO2: 24 mmol/L (ref 18–29)
Chloride: 101 mmol/L (ref 96–106)
Creatinine, Ser: 1 mg/dL (ref 0.57–1.00)
GFR, EST AFRICAN AMERICAN: 74 mL/min/{1.73_m2} (ref >59)
GFR, EST NON AFRICAN AMERICAN: 64 mL/min/{1.73_m2} (ref >59)
GLOBULIN, TOTAL: 3 g/dL (ref 1.5–4.5)
Glucose: 113 mg/dL — ABNORMAL HIGH (ref 65–99)
Potassium: 4.4 mmol/L (ref 3.5–5.2)
Sodium: 143 mmol/L (ref 134–144)
TOTAL PROTEIN: 7 g/dL (ref 6.0–8.5)

## 2016-07-13 LAB — SEDIMENTATION RATE: Sed Rate: 9 mm/hr (ref 0–40)

## 2016-07-13 LAB — MAGNESIUM: MAGNESIUM: 2 mg/dL (ref 1.6–2.3)

## 2016-07-13 LAB — C-REACTIVE PROTEIN: CRP: 28.3 mg/L — AB (ref 0.0–4.9)

## 2016-07-13 LAB — 25-HYDROXY VITAMIN D LCMS D2+D3
25-Hydroxy, Vitamin D-2: 1.4 ng/mL
25-Hydroxy, Vitamin D: 21 ng/mL — ABNORMAL LOW

## 2016-07-13 LAB — VITAMIN B12: Vitamin B-12: 775 pg/mL (ref 232–1245)

## 2016-07-14 LAB — COMPLIANCE DRUG ANALYSIS, UR

## 2016-07-21 ENCOUNTER — Ambulatory Visit: Payer: Medicare Other | Attending: Pain Medicine | Admitting: Physical Therapy

## 2016-07-30 ENCOUNTER — Encounter: Payer: Self-pay | Admitting: Nurse Practitioner

## 2016-07-30 DIAGNOSIS — E559 Vitamin D deficiency, unspecified: Secondary | ICD-10-CM | POA: Insufficient documentation

## 2016-07-30 NOTE — Progress Notes (Signed)
Results were reviewed and found to be: mildly abnormal  No acute injury or pathology identified  Review would suggest interventional pain management techniques may be of benefit 

## 2016-07-30 NOTE — Progress Notes (Signed)
Results were reviewed and found to be: abnormal  Further testing may be useful  Review would suggest interventional pain management techniques may be of benefit

## 2016-08-25 ENCOUNTER — Ambulatory Visit: Payer: Medicare Other | Attending: Pain Medicine | Admitting: Physical Therapy

## 2016-09-28 ENCOUNTER — Encounter: Payer: Self-pay | Admitting: Pain Medicine

## 2016-09-28 ENCOUNTER — Ambulatory Visit: Payer: Medicare Other | Attending: Pain Medicine | Admitting: Pain Medicine

## 2016-09-28 VITALS — BP 105/76 | HR 75 | Temp 98.5°F | Resp 16 | Ht 67.0 in | Wt 240.0 lb

## 2016-09-28 DIAGNOSIS — M488X6 Other specified spondylopathies, lumbar region: Secondary | ICD-10-CM | POA: Insufficient documentation

## 2016-09-28 DIAGNOSIS — Z7951 Long term (current) use of inhaled steroids: Secondary | ICD-10-CM | POA: Insufficient documentation

## 2016-09-28 DIAGNOSIS — E119 Type 2 diabetes mellitus without complications: Secondary | ICD-10-CM | POA: Diagnosis not present

## 2016-09-28 DIAGNOSIS — R209 Unspecified disturbances of skin sensation: Secondary | ICD-10-CM | POA: Diagnosis not present

## 2016-09-28 DIAGNOSIS — K909 Intestinal malabsorption, unspecified: Secondary | ICD-10-CM | POA: Diagnosis not present

## 2016-09-28 DIAGNOSIS — M542 Cervicalgia: Secondary | ICD-10-CM

## 2016-09-28 DIAGNOSIS — M4316 Spondylolisthesis, lumbar region: Secondary | ICD-10-CM | POA: Insufficient documentation

## 2016-09-28 DIAGNOSIS — M545 Low back pain, unspecified: Secondary | ICD-10-CM

## 2016-09-28 DIAGNOSIS — Z7984 Long term (current) use of oral hypoglycemic drugs: Secondary | ICD-10-CM | POA: Insufficient documentation

## 2016-09-28 DIAGNOSIS — J45909 Unspecified asthma, uncomplicated: Secondary | ICD-10-CM | POA: Diagnosis not present

## 2016-09-28 DIAGNOSIS — M5010 Cervical disc disorder with radiculopathy, unspecified cervical region: Secondary | ICD-10-CM | POA: Diagnosis not present

## 2016-09-28 DIAGNOSIS — E559 Vitamin D deficiency, unspecified: Secondary | ICD-10-CM | POA: Diagnosis not present

## 2016-09-28 DIAGNOSIS — M25561 Pain in right knee: Secondary | ICD-10-CM

## 2016-09-28 DIAGNOSIS — M79604 Pain in right leg: Secondary | ICD-10-CM

## 2016-09-28 DIAGNOSIS — Z79899 Other long term (current) drug therapy: Secondary | ICD-10-CM | POA: Insufficient documentation

## 2016-09-28 DIAGNOSIS — Z885 Allergy status to narcotic agent status: Secondary | ICD-10-CM | POA: Insufficient documentation

## 2016-09-28 DIAGNOSIS — Z79891 Long term (current) use of opiate analgesic: Secondary | ICD-10-CM | POA: Insufficient documentation

## 2016-09-28 DIAGNOSIS — M4722 Other spondylosis with radiculopathy, cervical region: Secondary | ICD-10-CM | POA: Insufficient documentation

## 2016-09-28 DIAGNOSIS — G8929 Other chronic pain: Secondary | ICD-10-CM

## 2016-09-28 DIAGNOSIS — M4310 Spondylolisthesis, site unspecified: Secondary | ICD-10-CM | POA: Diagnosis not present

## 2016-09-28 DIAGNOSIS — M5116 Intervertebral disc disorders with radiculopathy, lumbar region: Secondary | ICD-10-CM | POA: Diagnosis not present

## 2016-09-28 DIAGNOSIS — K219 Gastro-esophageal reflux disease without esophagitis: Secondary | ICD-10-CM | POA: Insufficient documentation

## 2016-09-28 DIAGNOSIS — M5416 Radiculopathy, lumbar region: Secondary | ICD-10-CM

## 2016-09-28 DIAGNOSIS — M47816 Spondylosis without myelopathy or radiculopathy, lumbar region: Secondary | ICD-10-CM

## 2016-09-28 DIAGNOSIS — G894 Chronic pain syndrome: Secondary | ICD-10-CM | POA: Diagnosis not present

## 2016-09-28 DIAGNOSIS — Z880 Allergy status to penicillin: Secondary | ICD-10-CM | POA: Insufficient documentation

## 2016-09-28 DIAGNOSIS — M4696 Unspecified inflammatory spondylopathy, lumbar region: Secondary | ICD-10-CM | POA: Diagnosis not present

## 2016-09-28 DIAGNOSIS — M170 Bilateral primary osteoarthritis of knee: Secondary | ICD-10-CM

## 2016-09-28 DIAGNOSIS — F329 Major depressive disorder, single episode, unspecified: Secondary | ICD-10-CM | POA: Diagnosis not present

## 2016-09-28 DIAGNOSIS — M25562 Pain in left knee: Secondary | ICD-10-CM

## 2016-09-28 DIAGNOSIS — Z7982 Long term (current) use of aspirin: Secondary | ICD-10-CM | POA: Insufficient documentation

## 2016-09-28 DIAGNOSIS — F1190 Opioid use, unspecified, uncomplicated: Secondary | ICD-10-CM | POA: Diagnosis not present

## 2016-09-28 MED ORDER — TRAMADOL HCL 50 MG PO TABS: 50.0000 mg | ORAL_TABLET | Freq: Four times a day (QID) | ORAL | 2 refills | Status: DC | PRN

## 2016-09-28 NOTE — Progress Notes (Signed)
Safety precautions to be maintained throughout the outpatient stay will include: orient to surroundings, keep bed in low position, maintain call bell within reach at all times, provide assistance with transfer out of bed and ambulation.  

## 2016-09-28 NOTE — Progress Notes (Signed)
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 intact. No obvious neuro-muscular anomalies detected. Sensory (Neurological): Movement-associated pain Palpation: Complains of area being tender to palpation       Provocative Tests: Lumbar Hyperextension and rotation test: Positive bilaterally for facet joint pain. Lumbar Lateral bending test: evaluation deferred today       Patrick's Maneuver: evaluation deferred today                    Gait & Posture Assessment  Ambulation: Unassisted Gait: Relatively normal for age and body habitus Posture: WNL   Lower Extremity Exam    Side: Right lower extremity  Side: Left lower extremity  Skin & Extremity Inspection: Skin color, temperature, and hair growth are WNL. No peripheral edema or cyanosis. No masses, redness, swelling, asymmetry, or associated skin lesions. No  contractures. She is using a brace for her right knee, however she is not using it appropriately. The brace is too low for the knee joint. She is pending to see the orthopedic surgeon after seeing me.   Skin & Extremity Inspection: Skin color, temperature, and hair growth are WNL. No peripheral edema or cyanosis. No masses, redness, swelling, asymmetry, or associated skin lesions. No contractures.  Functional ROM: Limited ROM for hip and knee joints  Functional ROM: Decreased ROM          Muscle Tone/Strength: Functionally intact. No obvious neuro-muscular anomalies detected.  Muscle Tone/Strength: Functionally intact. No obvious neuro-muscular anomalies detected.  Sensory (Neurological): Unimpaired  Sensory (Neurological): Unimpaired  Palpation: No palpable anomalies  Palpation: No palpable anomalies   Assessment & Plan  Primary Diagnosis & Pertinent Problem List: The primary encounter diagnosis was Chronic low back pain (Primary Source of Pain) (midline) ( (Bilateral) (R>L). Diagnoses of Chronic neck pain (Secondary source of pain) (midline) (Bilateral) (L>R), Chronic knee pain (Tertiary source of pain) (Bilateral) (R>L), Opiate use (30 MME/day), Vitamin D insufficiency, Chronic pain syndrome, Chronic lower extremity pain (Right), Lumbar radiculitis (Right) (L4), Osteoarthritis of knee (Bilateral) (R>L), Lumbar facet syndrome (Bilateral) (R>L), Grade 1 Anterolisthesis of L4 over L5 (5 mm), and Grade 1 Retrolisthesis of L5 over S1 (3 mm) were also pertinent to this visit.  Visit Diagnosis: 1. Chronic low back pain (Primary Source of Pain) (midline) ( (Bilateral) (R>L)   2. Chronic neck pain (Secondary source of pain) (midline) (Bilateral) (L>R)   3. Chronic knee pain [facility] source of pain) (Bilateral) (R>L)   4. Opiate use (30 MME/day)   5. Vitamin D insufficiency   6. Chronic pain syndrome   7. Chronic lower extremity pain (Right)   8. Lumbar radiculitis (Right) (L4)   9. Osteoarthritis of  knee (Bilateral) (R>L)   10. Lumbar facet syndrome (Bilateral) (R>L)   11. Grade 1 Anterolisthesis of L4 over L5 (5 mm)   12. Grade 1 Retrolisthesis of L5 over S1 (3 mm)    Problems updated and reviewed during this visit: Problem  Lumbar radiculitis (Right) (L4)  Chronic low back pain (Primary Source of Pain) (midline) ( (Bilateral) (R>L)  Chronic neck pain (Secondary source of pain) (midline) (Bilateral) (L>R)  Osteoarthritis of knee (Bilateral) (R>L)   Left: Medial compartment osteoarthritis Right: Patellofemoral osteoarthritis   Chronic knee pain (Tertiary source of pain) (Bilateral) (R>L)  Vitamin D Insufficiency  Opiate use (30 MME/day)    Plan  of Care  Pharmacotherapy (Medications Ordered): Meds ordered this encounter  Medications  . traMADol (ULTRAM) 50 MG tablet    Sig: Take 1 tablet (50 mg total) by mouth every 6 (six) hours as needed for severe pain.    Dispense:  120 tablet    Refill:  2    Do not place this medication, or any other prescription from our practice, on "Automatic Refill". Patient may have prescription filled one day early if pharmacy is closed on scheduled refill date. Do not fill until: 09/28/16 To last until: 12/27/16    Procedure Orders     Lumbar Epidural Injection     Lumbar Transforaminal Epidural     LUMBAR FACET(MEDIAL BRANCH NERVE BLOCK) MBNB     KNEE INJECTION Lab Orders  No laboratory test(s) ordered today   Imaging Orders  No imaging studies ordered today   Referral Orders  No referral(s) requested today    Pharmacological management options:  Opioid Analgesics: We'll take over management today. See above orders Membrane stabilizer: We have discussed the possibility of optimizing this mode of therapy, if tolerated Muscle relaxant: We have discussed the possibility of a trial NSAID: We have discussed the possibility of a trial Other analgesic(s): To be determined at a later time   Interventional management options: Planned,  scheduled, and/or pending:    Diagnostic right-sided L4-5 interlaminar lumbar epidural steroid injection + bilateral L4-5 transforaminal epidural steroid injection under fluoroscopic guidance and IV sedation    Considering:   Diagnostic bilateral lumbar facet block  Possible bilateral lumbar facet RFA  Diagnostic right L3-4 translaminar lumbar epidural steroid injection  Diagnostic right L3-4 transforaminal epidural steroid injection  Diagnostic right L4-5 transforaminal epidural steroid injection  Diagnostic right cervical epidural steroid injection  Diagnostic bilateral cervical facet block  Possible bilateral cervical facet RFA  Diagnostic bilateral intra-articular knee joint injection  Possible bilateral intra-articular Hyalgan knee injection series  Diagnostic bilateral Genicular nerve block  Possible bilateral Genicular nerve RFA    PRN Procedures:   Diagnostic bilateral intra-articular knee joint injection  Diagnostic bilateral lumbar facet block    Provider-requested follow-up: Return for Procedure (with sedation): L4-5 ESI.  Future Appointments Date Time Provider [place]  10/13/2016 12:45 PM Milinda Pointer, MD [facility] None  10/28/2016 8:15 AM Milinda Pointer, MD [facility] None  12/15/2016 9:30 AM Vevelyn Francois, NP [facility] None    Primary Care Physician: Vanessa Market, MD Location: [facility] Note by: Vanessa Cola, MD Date: 09/28/2016; Time: 9:44 AM

## 2016-09-28 NOTE — Patient Instructions (Addendum)
____________________________________________________________________________________________  Pain Scale  Introduction: The pain score used by this practice is the Verbal Numerical Rating Scale (VNRS-11). This is an 11-point scale. It is for adults and children 10 years or older. There are significant differences in how the pain score is reported, used, and applied. Forget everything you learned in the past and learn this scoring system.  General Information: The scale should reflect your current level of pain. Unless you are specifically asked for the level of your worst pain, or your average pain. If you are asked for one of these two, then it should be understood that it is over the past 24 hours.  Basic Activities of Daily Living (ADL): Personal hygiene, dressing, eating, transferring, and using restroom.  Instructions: Most patients tend to report their level of pain as a combination of two factors, their physical pain and their psychosocial pain. This last one is also known as "suffering" and it is reflection of how physical pain affects you socially and psychologically. From now on, report them separately. From this point on, when asked to report your pain level, report only your physical pain. Use the following table for reference.  Pain Clinic Pain Levels (0-5/10)  Pain Level Score  Description  No Pain 0   Mild pain 1 Nagging, annoying, but does not interfere with basic activities of daily living (ADL). Patients are able to eat, bathe, get dressed, toileting (being able to get on and off the toilet and perform personal hygiene functions), transfer (move in and out of bed or a chair without assistance), and maintain continence (able to control bladder and bowel functions). Blood pressure and heart rate are unaffected. A normal heart rate for a healthy adult ranges from 60 to 100 bpm (beats per minute).   Mild to moderate pain 2 Noticeable and distracting. Impossible to hide from other  people. More frequent flare-ups. Still possible to adapt and function close to normal. It can be very annoying and may have occasional stronger flare-ups. With discipline, patients may get used to it and adapt.   Moderate pain 3 Interferes significantly with activities of daily living (ADL). It becomes difficult to feed, bathe, get dressed, get on and off the toilet or to perform personal hygiene functions. Difficult to get in and out of bed or a chair without assistance. Very distracting. With effort, it can be ignored when deeply involved in activities.   Moderately severe pain 4 Impossible to ignore for more than a few minutes. With effort, patients may still be able to manage work or participate in some social activities. Very difficult to concentrate. Signs of autonomic nervous system discharge are evident: dilated pupils (mydriasis); mild sweating (diaphoresis); sleep interference. Heart rate becomes elevated (>115 bpm). Diastolic blood pressure (lower number) rises above 100 mmHg. Patients find relief in laying down and not moving.   Severe pain 5 Intense and extremely unpleasant. Associated with frowning face and frequent crying. Pain overwhelms the senses.  Ability to do any activity or maintain social relationships becomes significantly limited. Conversation becomes difficult. Pacing back and forth is common, as getting into a comfortable position is nearly impossible. Pain wakes you up from deep sleep. Physical signs will be obvious: pupillary dilation; increased sweating; goosebumps; brisk reflexes; cold, clammy hands and feet; nausea, vomiting or dry heaves; loss of appetite; significant sleep disturbance with inability to fall asleep or to remain asleep. When persistent, significant weight loss is observed due to the complete loss of appetite and sleep deprivation.  Blood   pressure and heart rate becomes significantly elevated. Caution: If elevated blood pressure triggers a pounding headache  associated with blurred vision, then the patient should immediately seek attention at an urgent or emergency care unit, as these may be signs of an impending stroke.    Emergency Department Pain Levels (6-10/10)  Emergency Room Pain 6 Severely limiting. Requires emergency care and should not be seen or managed at an outpatient pain management facility. Communication becomes difficult and requires great effort. Assistance to reach the emergency department may be required. Facial flushing and profuse sweating along with potentially dangerous increases in heart rate and blood pressure will be evident.   Distressing pain 7 Self-care is very difficult. Assistance is required to transport, or use restroom. Assistance to reach the emergency department will be required. Tasks requiring coordination, such as bathing and getting dressed become very difficult.   Disabling pain 8 Self-care is no longer possible. At this level, pain is disabling. The individual is unable to do even the most "basic" activities such as walking, eating, bathing, dressing, transferring to a bed, or toileting. Fine motor skills are lost. It is difficult to think clearly.   Incapacitating pain 9 Pain becomes incapacitating. Thought processing is no longer possible. Difficult to remember your own name. Control of movement and coordination are lost.   The worst pain imaginable 10 At this level, most patients pass out from pain. When this level is reached, collapse of the autonomic nervous system occurs, leading to a sudden drop in blood pressure and heart rate. This in turn results in a temporary and dramatic drop in blood flow to the brain, leading to a loss of consciousness. Fainting is one of the body's self defense mechanisms. Passing out puts the brain in a calmed state and causes it to shut down for a while, in order to begin the healing process.    Summary: 1. Refer to this scale when providing [place] with your pain level. 2. Be  accurate and careful when reporting your pain level. This will help with your care. 3. Over-reporting your pain level will lead to loss of credibility. 4. Even a level of 1/10 means that there is pain and will be treated at our facility. 5. High, inaccurate reporting will be documented as "Symptom Exaggeration", leading to loss of credibility and suspicions of possible secondary gains such as obtaining more narcotics, or wanting to appear disabled, for fraudulent reasons. 6. Only pain levels of 5 or below will be seen at our facility. 7. Pain levels of 6 and above will be sent to the Emergency Department and the appointment cancelled. ____________________________________________________________________________________________  ____________________________________________________________________________________________  Preparing for Procedure with Sedation Instructions: . Oral Intake: Do not eat or drink anything for at least 8 hours prior to your procedure. . Transportation: Public transportation is not allowed. Bring an adult driver. The driver must be physically present in our waiting room before any procedure can be started. Marland Kitchen Physical Assistance: Bring an adult physically capable of assisting you, in the event you need help. This adult should keep you company at home for at least 6 hours after the procedure. . Blood Pressure Medicine: Take your blood pressure medicine with a sip of water the morning of the procedure. . Blood thinners:  . Diabetics on insulin: Notify the staff so that you can be scheduled 1st case in the morning. If your diabetes requires high dose insulin, take only  of your normal insulin dose the morning of the procedure and notify the  staff that you have done so. . Preventing infections: Shower with an antibacterial soap the morning of your procedure. . Build-up your immune system: Take 1000 mg of Vitamin C with every meal (3 times a day) the day prior to your  procedure. Marland Kitchen Antibiotics: Inform the staff if you have a condition or reason that requires you to take antibiotics before dental procedures. . Pregnancy: If you are pregnant, call and cancel the procedure. . Sickness: If you have a cold, fever, or any active infections, call and cancel the procedure. . Arrival: You must be in the facility at least 30 minutes prior to your scheduled procedure. . Children: Do not bring children with you. . Dress appropriately: Bring dark clothing that you would not mind if they get stained. . Valuables: Do not bring any jewelry or valuables. Procedure appointments are reserved for interventional treatments only. Marland Kitchen No Prescription Refills. . No medication changes will be discussed during procedure appointments. . No disability issues will be discussed. ____________________________________________________________________________________________  GENERAL RISKS AND COMPLICATIONS  What are the risk, side effects and possible complications? Generally speaking, most procedures are safe.  However, with any procedure there are risks, side effects, and the possibility of complications.  The risks and complications are dependent upon the sites that are lesioned, or the type of nerve block to be performed.  The closer the procedure is to the spine, the more serious the risks are.  Great care is taken when placing the radio frequency needles, block needles or lesioning probes, but sometimes complications can occur. 1. Infection: Any time there is an injection through the skin, there is a risk of infection.  This is why sterile conditions are used for these blocks.  There are four possible types of infection. 1. Localized skin infection. 2. Central Nervous System Infection-This can be in the form of Meningitis, which can be deadly. 3. Epidural Infections-This can be in the form of an epidural abscess, which can cause pressure inside of the spine, causing compression of the  spinal cord with subsequent paralysis. This would require an emergency surgery to decompress, and there are no guarantees that the patient would recover from the paralysis. 4. Discitis-This is an infection of the intervertebral discs.  It occurs in about 1% of discography procedures.  It is difficult to treat and it may lead to surgery.        2. Pain: the needles have to go through skin and soft tissues, will cause soreness.       3. Damage to internal structures:  The nerves to be lesioned may be near blood vessels or    other nerves which can be potentially damaged.       4. Bleeding: Bleeding is more common if the patient is taking blood thinners such as  aspirin, Coumadin, Ticiid, Plavix, etc., or if he/she have some genetic predisposition  such as hemophilia. Bleeding into the spinal canal can cause compression of the spinal  cord with subsequent paralysis.  This would require an emergency surgery to  decompress and there are no guarantees that the patient would recover from the  paralysis.       5. Pneumothorax:  Puncturing of a lung is a possibility, every time a needle is introduced in  the area of the chest or upper back.  Pneumothorax refers to free air around the  collapsed lung(s), inside of the thoracic cavity (chest cavity).  Another two possible  complications related to a similar event would include: Hemothorax  and Chylothorax.   These are variations of the Pneumothorax, where instead of air around the collapsed  lung(s), you may have blood or chyle, respectively.       6. Spinal headaches: They may occur with any procedures in the area of the spine.       7. Persistent CSF (Cerebro-Spinal Fluid) leakage: This is a rare problem, but may occur  with prolonged intrathecal or epidural catheters either due to the formation of a fistulous  track or a dural tear.       8. Nerve damage: By working so close to the spinal cord, there is always a possibility of  nerve damage, which could be as  serious as a permanent spinal cord injury with  paralysis.       9. Death:  Although rare, severe deadly allergic reactions known as "Anaphylactic  reaction" can occur to any of the medications used.      10. Worsening of the symptoms:  We can always make thing worse.  What are the chances of something like this happening? Chances of any of this occuring are extremely low.  By statistics, you have more of a chance of getting killed in a motor vehicle accident: while driving to the hospital than any of the above occurring .  Nevertheless, you should be aware that they are possibilities.  In general, it is similar to taking a shower.  Everybody knows that you can slip, hit your head and get killed.  Does that mean that you should not shower again?  Nevertheless always keep in mind that statistics do not mean anything if you happen to be on the wrong side of them.  Even if a procedure has a 1 (one) in a 1,000,000 (million) chance of going wrong, it you happen to be that one..Also, keep in mind that by statistics, you have more of a chance of having something go wrong when taking medications.  Who should not have this procedure? If you are on a blood thinning medication (e.g. Coumadin, Plavix, see list of "Blood Thinners"), or if you have an active infection going on, you should not have the procedure.  If you are taking any blood thinners, please inform your physician.  How should I prepare for this procedure?  Do not eat or drink anything at least six hours prior to the procedure.  Bring a driver with you .  It cannot be a taxi.  Come accompanied by an adult that can drive you back, and that is strong enough to help you if your legs get weak or numb from the local anesthetic.  Take all of your medicines the morning of the procedure with just enough water to swallow them.  If you have diabetes, make sure that you are scheduled to have your procedure done first thing in the morning, whenever  possible.  If you have diabetes, take only half of your insulin dose and notify our nurse that you have done so as soon as you arrive at the clinic.  If you are diabetic, but only take blood sugar pills (oral hypoglycemic), then do not take them on the morning of your procedure.  You may take them after you have had the procedure.  Do not take aspirin or any aspirin-containing medications, at least eleven (11) days prior to the procedure.  They may prolong bleeding.  Wear loose fitting clothing that may be easy to take off and that you would not mind if it got stained with  Betadine or blood.  Do not wear any jewelry or perfume  Remove any nail coloring.  It will interfere with some of our monitoring equipment.  NOTE: Remember that this is not meant to be interpreted as a complete list of all possible complications.  Unforeseen problems may occur.  BLOOD THINNERS The following drugs contain aspirin or other products, which can cause increased bleeding during surgery and should not be taken for 2 weeks prior to and 1 week after surgery.  If you should need take something for relief of minor pain, you may take acetaminophen which is found in Tylenol,m Datril, Anacin-3 and Panadol. It is not blood thinner. The products listed below are.  Do not take any of the products listed below in addition to any listed on your instruction sheet.  A.P.C or A.P.C with Codeine Codeine Phosphate Capsules #3 Ibuprofen Ridaura  ABC compound Congesprin Imuran rimadil  Advil Cope Indocin Robaxisal  Alka-Seltzer Effervescent Pain Reliever and Antacid Coricidin or Coricidin-D  Indomethacin Rufen  Alka-Seltzer plus Cold Medicine Cosprin Ketoprofen S-A-C Tablets  Anacin Analgesic Tablets or Capsules Coumadin Korlgesic Salflex  Anacin Extra Strength Analgesic tablets or capsules CP-2 Tablets Lanoril Salicylate  Anaprox Cuprimine Capsules Levenox Salocol  Anexsia-D Dalteparin Magan Salsalate  Anodynos Darvon compound  Magnesium Salicylate Sine-off  Ansaid Dasin Capsules Magsal Sodium Salicylate  Anturane Depen Capsules Marnal Soma  APF Arthritis pain formula Dewitt's Pills Measurin Stanback  Argesic Dia-Gesic Meclofenamic Sulfinpyrazone  Arthritis Bayer Timed Release Aspirin Diclofenac Meclomen Sulindac  Arthritis pain formula Anacin Dicumarol Medipren Supac  Analgesic (Safety coated) Arthralgen Diffunasal Mefanamic Suprofen  Arthritis Strength Bufferin Dihydrocodeine Mepro Compound Suprol  Arthropan liquid Dopirydamole Methcarbomol with Aspirin Synalgos  ASA tablets/Enseals Disalcid Micrainin Tagament  Ascriptin Doan's Midol Talwin  Ascriptin A/D Dolene Mobidin Tanderil  Ascriptin Extra Strength Dolobid Moblgesic Ticlid  Ascriptin with Codeine Doloprin or Doloprin with Codeine Momentum Tolectin  Asperbuf Duoprin Mono-gesic Trendar  Aspergum Duradyne Motrin or Motrin IB Triminicin  Aspirin plain, buffered or enteric coated Durasal Myochrisine Trigesic  Aspirin Suppositories Easprin Nalfon Trillsate  Aspirin with Codeine Ecotrin Regular or Extra Strength Naprosyn Uracel  Atromid-S Efficin Naproxen Ursinus  Auranofin Capsules Elmiron Neocylate Vanquish  Axotal Emagrin Norgesic Verin  Azathioprine Empirin or Empirin with Codeine Normiflo Vitamin E  Azolid Emprazil Nuprin Voltaren  Bayer Aspirin plain, buffered or children's or timed BC Tablets or powders Encaprin Orgaran Warfarin Sodium  Buff-a-Comp Enoxaparin Orudis Zorpin  Buff-a-Comp with Codeine Equegesic Os-Cal-Gesic   Buffaprin Excedrin plain, buffered or Extra Strength Oxalid   Bufferin Arthritis Strength Feldene Oxphenbutazone   Bufferin plain or Extra Strength Feldene Capsules Oxycodone with Aspirin   Bufferin with Codeine Fenoprofen Fenoprofen Pabalate or Pabalate-SF   Buffets II Flogesic Panagesic   Buffinol plain or Extra Strength Florinal or Florinal with Codeine Panwarfarin   Buf-Tabs Flurbiprofen Penicillamine   Butalbital Compound  Four-way cold tablets Penicillin   Butazolidin Fragmin Pepto-Bismol   Carbenicillin Geminisyn Percodan   Carna Arthritis Reliever Geopen Persantine   Carprofen Gold's salt Persistin   Chloramphenicol Goody's Phenylbutazone   Chloromycetin Haltrain Piroxlcam   Clmetidine heparin Plaquenil   Cllnoril Hyco-pap Ponstel   Clofibrate Hydroxy chloroquine Propoxyphen         Before stopping any of these medications, be sure to consult the physician who ordered them.  Some, such as Coumadin (Warfarin) are ordered to prevent or treat serious conditions such as "deep thrombosis", "pumonary embolisms", and other heart problems.  The amount of time that you may  need off of the medication may also vary with the medication and the reason for which you were taking it.  If you are taking any of these medications, please make sure you notify your pain physician before you undergo any procedures.          Facet Joint Block The facet joints connect the bones of the spine (vertebrae). They make it possible for you to bend, twist, and make other movements with your spine. They also keep you from bending too far, twisting too far, and making other excessive movements. A facet joint block is a procedure where a numbing medicine (anesthetic) is injected into a facet joint. Often, a type of anti-inflammatory medicine called a steroid is also injected. A facet joint block may be done to diagnose neck or back pain. If the pain gets better after a facet joint block, it means the pain is probably coming from the facet joint. If the pain does not get better, it means the pain is probably not coming from the facet joint. A facet joint block may also be done to relieve neck or back pain caused by an inflamed facet joint. A facet joint block is only done to relieve pain if the pain does not improve with other methods, such as medicine, exercise programs, and physical therapy. Tell a health care provider about:  Any  allergies you have.  All medicines you are taking, including vitamins, herbs, eye drops, creams, and over-the-counter medicines.  Any problems you or family members have had with anesthetic medicines.  Any blood disorders you have.  Any surgeries you have had.  Any medical conditions you have.  Whether you are pregnant or may be pregnant. What are the risks? Generally, this is a safe procedure. However, problems may occur, including:  Bleeding.  Injury to a nerve near the injection site.  Pain at the injection site.  Weakness or numbness in areas controlled by nerves near the injection site.  Infection.  Temporary fluid retention.  Allergic reactions to medicines or dyes.  Injury to other structures or organs near the injection site.  What happens before the procedure?  Follow instructions from your health care provider about eating or drinking restrictions.  Ask your health care provider about: ? Changing or stopping your regular medicines. This is especially important if you are taking diabetes medicines or blood thinners. ? Taking medicines such as aspirin and ibuprofen. These medicines can thin your blood. Do not take these medicines before your procedure if your health care provider instructs you not to.  Do not take any new dietary supplements or medicines without asking your health care provider first.  Plan to have someone take you home after the procedure. What happens during the procedure?  You may need to remove your clothing and dress in an open-back gown.  The procedure will be done while you are lying on an X-ray table. You will most likely be asked to lie on your stomach, but you may be asked to lie in a different position if an injection will be made in your neck.  Machines will be used to monitor your oxygen levels, heart rate, and blood pressure.  If an injection will be made in your neck, an IV tube will be inserted into one of your veins. Fluids  and medicine will flow directly into your body through the IV tube.  The area over the facet joint where the injection will be made will be cleaned with soap. The surrounding  skin will be covered with clean drapes.  A numbing medicine (local anesthetic) will be applied to your skin. Your skin may sting or burn for a moment.  A video X-ray machine (fluoroscopy) will be used to locate the joint. In some cases, a CT scan may be used.  A contrast dye may be injected into the facet joint area to help locate the joint.  When the joint is located, an anesthetic will be injected into the joint through the needle.  Your health care provider will ask you whether you feel pain relief. If you do feel relief, a steroid may be injected to provide pain relief for a longer period of time. If you do not feel relief or feel only partial relief, additional injections of an anesthetic may be made in other facet joints.  The needle will be removed.  Your skin will be cleaned.  A bandage (dressing) will be applied over each injection site. The procedure may vary among health care providers and hospitals. What happens after the procedure?  You will be observed for 15-30 minutes before being allowed to go home. This information is not intended to replace advice given to you by your health care provider. Make sure you discuss any questions you have with your health care provider. Document Released: 06/10/2006 Document Revised: 02/20/2015 Document Reviewed: 10/15/2014 [employer] Interactive Patient Education  2018 [place]. Epidural Steroid Injection Patient Information  Description: The epidural space surrounds the nerves as they exit the spinal cord.  In some patients, the nerves can be compressed and inflamed by a bulging disc or a tight spinal canal (spinal stenosis).  By injecting steroids into the epidural space, we can bring irritated nerves into direct contact with a potentially helpful medication.   These steroids act directly on the irritated nerves and can reduce swelling and inflammation which often leads to decreased pain.  Epidural steroids may be injected anywhere along the spine and from the neck to the low back depending upon the location of your pain.   After numbing the skin with local anesthetic (like Novocaine), a small needle is passed into the epidural space slowly.  You may experience a sensation of pressure while this is being done.  The entire block usually last less than 10 minutes.  Conditions which may be treated by epidural steroids:   Low back and leg pain  Neck and arm pain  Spinal stenosis  Post-laminectomy syndrome  Herpes zoster (shingles) pain  Pain from compression fractures  Preparation for the injection:  1. Do not eat any solid food or dairy products within 8 hours of your appointment.  2. You may drink clear liquids up to 3 hours before appointment.  Clear liquids include water, black coffee, juice or soda.  No milk or cream please. 3. You may take your regular medication, including pain medications, with a sip of water before your appointment  Diabetics should hold regular insulin (if taken separately) and take 1/2 normal NPH dos the morning of the procedure.  Carry some sugar containing items with you to your appointment. 4. A driver must accompany you and be prepared to drive you home after your procedure.  5. Bring all your current medications with your. 6. An IV may be inserted and sedation may be given at the discretion of the physician.   7. A blood pressure cuff, EKG and other monitors will often be applied during the procedure.  Some patients may need to have extra oxygen administered for a  short period. 8. You will be asked to provide medical information, including your allergies, prior to the procedure.  We must know immediately if you are taking blood thinners (like Coumadin/Warfarin)  Or if you are allergic to IV iodine contrast (dye). We  must know if you could possible be pregnant.  Possible side-effects:  Bleeding from needle site  Infection (rare, may require surgery)  Nerve injury (rare)  Numbness & tingling (temporary)  Difficulty urinating (rare, temporary)  Spinal headache ( a headache worse with upright posture)  Light -headedness (temporary)  Pain at injection site (several days)  Decreased blood pressure (temporary)  Weakness in arm/leg (temporary)  Pressure sensation in back/neck (temporary)  Call if you experience:  Fever/chills associated with headache or increased back/neck pain.  Headache worsened by an upright position.  New onset weakness or numbness of an extremity below the injection site  Hives or difficulty breathing (go to the emergency room)  Inflammation or drainage at the infection site  Severe back/neck pain  Any new symptoms which are concerning to you  Please note:  Although the local anesthetic injected can often make your back or neck feel good for several hours after the injection, the pain will likely return.  It takes 3-7 days for steroids to work in the epidural space.  You may not notice any pain relief for at least that one week.  If effective, we will often do a series of three injections spaced 3-6 weeks apart to maximally decrease your pain.  After the initial series, we generally will wait several months before considering a repeat injection of the same type.  If you have any questions, please call [phone] [facility]

## 2016-10-13 ENCOUNTER — Ambulatory Visit (HOSPITAL_BASED_OUTPATIENT_CLINIC_OR_DEPARTMENT_OTHER): Payer: Medicare Other | Admitting: Pain Medicine

## 2016-10-13 ENCOUNTER — Ambulatory Visit
Admission: RE | Admit: 2016-10-13 | Discharge: 2016-10-13 | Disposition: A | Discharge status: Home / Self Care | Payer: Medicare Other | Source: Ambulatory Visit | Attending: Pain Medicine | Admitting: Pain Medicine

## 2016-10-13 ENCOUNTER — Encounter: Payer: Self-pay | Admitting: Pain Medicine

## 2016-10-13 VITALS — BP 106/56 | HR 66 | Temp 96.6°F | Resp 18 | Ht 67.0 in | Wt 240.0 lb

## 2016-10-13 DIAGNOSIS — M79643 Pain in unspecified hand: Secondary | ICD-10-CM | POA: Diagnosis not present

## 2016-10-13 DIAGNOSIS — M549 Dorsalgia, unspecified: Secondary | ICD-10-CM | POA: Diagnosis present

## 2016-10-13 DIAGNOSIS — M5416 Radiculopathy, lumbar region: Secondary | ICD-10-CM | POA: Diagnosis not present

## 2016-10-13 DIAGNOSIS — Z880 Allergy status to penicillin: Secondary | ICD-10-CM | POA: Insufficient documentation

## 2016-10-13 DIAGNOSIS — G8929 Other chronic pain: Secondary | ICD-10-CM | POA: Insufficient documentation

## 2016-10-13 DIAGNOSIS — M5136 Other intervertebral disc degeneration, lumbar region: Secondary | ICD-10-CM

## 2016-10-13 DIAGNOSIS — Z90710 Acquired absence of both cervix and uterus: Secondary | ICD-10-CM | POA: Insufficient documentation

## 2016-10-13 DIAGNOSIS — M542 Cervicalgia: Secondary | ICD-10-CM | POA: Diagnosis present

## 2016-10-13 DIAGNOSIS — M5116 Intervertebral disc disorders with radiculopathy, lumbar region: Secondary | ICD-10-CM | POA: Diagnosis not present

## 2016-10-13 DIAGNOSIS — M79604 Pain in right leg: Secondary | ICD-10-CM | POA: Diagnosis not present

## 2016-10-13 DIAGNOSIS — Z9851 Tubal ligation status: Secondary | ICD-10-CM | POA: Diagnosis not present

## 2016-10-13 MED ORDER — FENTANYL CITRATE (PF) 100 MCG/2ML IJ SOLN
25.0000 ug | INTRAMUSCULAR | Status: DC | PRN
  Administered 2016-10-13: 50 ug via INTRAVENOUS

## 2016-10-13 MED ORDER — TRIAMCINOLONE ACETONIDE 40 MG/ML IJ SUSP
INTRAMUSCULAR | Status: AC
  Filled 2016-10-13: qty 1

## 2016-10-13 MED ORDER — IOPAMIDOL (ISOVUE-M 200) INJECTION 41%
10.0000 mL | Freq: Once | INTRAMUSCULAR | Status: AC
  Administered 2016-10-13: 10 mL via EPIDURAL

## 2016-10-13 MED ORDER — MIDAZOLAM HCL 5 MG/5ML IJ SOLN
INTRAMUSCULAR | Status: AC
  Filled 2016-10-13: qty 5

## 2016-10-13 MED ORDER — FENTANYL CITRATE (PF) 100 MCG/2ML IJ SOLN
INTRAMUSCULAR | Status: AC
  Filled 2016-10-13: qty 2

## 2016-10-13 MED ORDER — SODIUM CHLORIDE 0.9 % IJ SOLN
INTRAMUSCULAR | Status: AC
  Filled 2016-10-13: qty 10

## 2016-10-13 MED ORDER — SODIUM CHLORIDE 0.9% FLUSH
2.0000 mL | Freq: Once | INTRAVENOUS | Status: AC
  Administered 2016-10-13: 2 mL

## 2016-10-13 MED ORDER — LIDOCAINE HCL 2 % IJ SOLN
10.0000 mL | Freq: Once | INTRAMUSCULAR | Status: AC
  Administered 2016-10-13: 400 mg

## 2016-10-13 MED ORDER — TRIAMCINOLONE ACETONIDE 40 MG/ML IJ SUSP
40.0000 mg | Freq: Once | INTRAMUSCULAR | Status: AC
  Administered 2016-10-13: 40 mg

## 2016-10-13 MED ORDER — ROPIVACAINE HCL 2 MG/ML IJ SOLN
2.0000 mL | Freq: Once | INTRAMUSCULAR | Status: AC
  Administered 2016-10-13: 2 mL via EPIDURAL

## 2016-10-13 MED ORDER — LACTATED RINGERS IV SOLN
1000.0000 mL | Freq: Once | INTRAVENOUS | Status: AC
  Administered 2016-10-13: 1000 mL via INTRAVENOUS

## 2016-10-13 MED ORDER — MIDAZOLAM HCL 5 MG/5ML IJ SOLN
1.0000 mg | INTRAMUSCULAR | Status: DC | PRN
  Administered 2016-10-13: 2 mg via INTRAVENOUS

## 2016-10-13 MED ORDER — LIDOCAINE HCL 2 % IJ SOLN
INTRAMUSCULAR | Status: AC
  Filled 2016-10-13: qty 20

## 2016-10-13 NOTE — Progress Notes (Signed)
Safety precautions to be maintained throughout the outpatient stay will include: orient to surroundings, keep bed in low position, maintain call bell within reach at all times, provide assistance with transfer out of bed and ambulation.  

## 2016-10-13 NOTE — Patient Instructions (Addendum)
____________________________________________________________________________________________  Post-Procedure instructions Instructions:  Apply ice: Fill a plastic sandwich bag with crushed ice. Cover it with a small towel and apply to injection site. Apply for 15 minutes then remove x 15 minutes. Repeat sequence on day of procedure, until you go to bed. The purpose is to minimize swelling and discomfort after procedure.  Apply heat: Apply heat to procedure site starting the day following the procedure. The purpose is to treat any soreness and discomfort from the procedure.  Food intake: Start with clear liquids (like water) and advance to regular food, as tolerated.   Physical activities: Keep activities to a minimum for the first 8 hours after the procedure.   Driving: If you have received any sedation, you are not allowed to drive for 24 hours after your procedure.  Blood thinner: Restart your blood thinner 6 hours after your procedure. (Only for those taking blood thinners)  Insulin: As soon as you can eat, you may resume your normal dosing schedule. (Only for those taking insulin)  Infection prevention: Keep procedure site clean and dry.  Post-procedure Pain Diary: Extremely important that this be done correctly and accurately. Recorded information will be used to determine the next step in treatment.  Pain evaluated is that of treated area only. Do not include pain from an untreated area.  Complete every hour, on the hour, for the initial 8 hours. Set an alarm to help you do this part accurately.  Do not go to sleep and have it completed later. It will not be accurate.  Follow-up appointment: Keep your follow-up appointment after the procedure. Usually 2 weeks for most procedures. (6 weeks in the case of radiofrequency.) Bring you pain diary.  Expect:  From numbing medicine (AKA: Local Anesthetics): Numbness or decrease in pain.  Onset: Full effect within 15 minutes of  injected.  Duration: It will depend on the type of local anesthetic used. On the average, 1 to 8 hours.   From steroids: Decrease in swelling or inflammation. Once inflammation is improved, relief of the pain will follow.  Onset of benefits: Depends on the amount of swelling present. The more swelling, the longer it will take for the benefits to be seen. In some cases, up to 10 days.  Duration: Steroids will stay in the system x 2 weeks. Duration of benefits will depend on multiple posibilities including persistent irritating factors.  From procedure: Some discomfort is to be expected once the numbing medicine wears off. This should be minimal if ice and heat are applied as instructed. Call if:  You experience numbness and weakness that gets worse with time, as opposed to wearing off.  New onset bowel or bladder incontinence. (Spinal procedures only)  Emergency Numbers:  Durning business hours (Monday - Thursday, 8:00 AM - 4:00 PM) (Friday, 9:00 AM - 12:00 Noon): [phone]  After hours: [phone] ____________________________________________________________________________________________  Pain Management Discharge Instructions  General Discharge Instructions :  If you need to reach your doctor call: Monday-Friday 8:00 am - 4:00 pm at [phone] or toll free [phone].  After clinic hours [phone] to have operator reach doctor.  Bring all of your medication bottles to all your appointments in the pain clinic.  To cancel or reschedule your appointment with Pain Management please remember to call 24 hours in advance to avoid a fee.  Refer to the educational materials which you have been given on: General Risks, I had my Procedure. Discharge Instructions, Post Sedation.  Post Procedure Instructions:  The drugs you   were given will stay in your system until tomorrow, so for the next 24 hours you should not drive, make any legal decisions or drink any alcoholic  beverages.  You may eat anything you prefer, but it is better to start with liquids then soups and crackers, and gradually work up to solid foods.  Please notify your doctor immediately if you have any unusual bleeding, trouble breathing or pain that is not related to your normal pain.  Depending on the type of procedure that was done, some parts of your body may feel week and/or numb.  This usually clears up by tonight or the next day.  Walk with the use of an assistive device or accompanied by an adult for the 24 hours.  You may use ice on the affected area for the first 24 hours.  Put ice in a Ziploc bag and cover with a towel and place against area 15 minutes on 15 minutes off.  You may switch to heat after 24 hours. Epidural Steroid Injection An epidural steroid injection is a shot of steroid medicine and numbing medicine that is given into the space between the spinal cord and the bones in your back (epidural space). The shot helps relieve pain caused by an irritated or swollen nerve root. The amount of pain relief you get from the injection depends on what is causing the nerve to be swollen and irritated, and how long your pain lasts. You are more likely to benefit from this injection if your pain is strong and comes on suddenly rather than if you have had pain for a long time. Tell a health care provider about:  Any allergies you have.  All medicines you are taking, including vitamins, herbs, eye drops, creams, and over-the-counter medicines.  Any problems you or family members have had with anesthetic medicines.  Any blood disorders you have.  Any surgeries you have had.  Any medical conditions you have.  Whether you are pregnant or may be pregnant. What are the risks? Generally, this is a safe procedure. However, problems may occur, including:  Headache.  Bleeding.  Infection.  Allergic reaction to medicines.  Damage to your nerves.  What happens before the  procedure? Staying hydrated Follow instructions from your health care provider about hydration, which may include:  Up to 2 hours before the procedure - you may continue to drink clear liquids, such as water, clear fruit juice, black coffee, and plain tea.  Eating and drinking restrictions Follow instructions from your health care provider about eating and drinking, which may include:  8 hours before the procedure - stop eating heavy meals or foods such as meat, fried foods, or fatty foods.  6 hours before the procedure - stop eating light meals or foods, such as toast or cereal.  6 hours before the procedure - stop drinking milk or drinks that contain milk.  2 hours before the procedure - stop drinking clear liquids.  Medicine  You may be given medicines to lower anxiety.  Ask your health care provider about: ? Changing or stopping your regular medicines. This is especially important if you are taking diabetes medicines or blood thinners. ? Taking medicines such as aspirin and ibuprofen. These medicines can thin your blood. Do not take these medicines before your procedure if your health care provider instructs you not to. General instructions  Plan to have someone take you home from the hospital or clinic. What happens during the procedure?  You may receive a medicine   to help you relax (sedative).  You will be asked to lie on your abdomen.  The injection site will be cleaned.  A numbing medicine (local anesthetic) will be used to numb the injection site.  A needle will be inserted through your skin into the epidural space. You may feel some discomfort when this happens. An X-ray machine will be used to make sure the needle is put as close as possible to the affected nerve.  A steroid medicine and a local anesthetic will be injected into the epidural space.  The needle will be removed.  A bandage (dressing) will be put over the injection site. What happens after the  procedure?  Your blood pressure, heart rate, breathing rate, and blood oxygen level will be monitored until the medicines you were given have worn off.  Your arm or leg may feel weak or numb for a few hours.  The injection site may feel sore.  Do not drive for 24 hours if you received a sedative. This information is not intended to replace advice given to you by your health care provider. Make sure you discuss any questions you have with your health care provider. Document Released: 04/28/2007 Document Revised: 07/03/2015 Document Reviewed: 05/07/2015 [employer] Interactive Patient Education  2017 [employer]  

## 2016-10-13 NOTE — Progress Notes (Signed)
Patient's Name: Vanessa Fisher  MRN: [id number]  Referring Provider: Lorelee Market, MD  DOB: [birth year]  PCP: Lorelee Market, MD  DOS: 10/13/2016  Note by: Gaspar Cola, MD  Service setting: Ambulatory outpatient  Specialty: Interventional Pain Management  Patient type: Established  Location: [facility] (AMB) Pain Management Facility  Visit type: Interventional Procedure   Primary Reason for Visit: Interventional Pain Management Treatment. CC: Back Pain (lower and upper); Neck Pain; and Hand Pain  Procedure:  Anesthesia, Analgesia, Anxiolysis:  Type: Therapeutic Inter-Laminar Epidural Steroid Injection Region: Lumbar Level: L4-5 Level. Laterality: Right-Sided Paramedial  Type: Local Anesthesia with Moderate (Conscious) Sedation Local Anesthetic: Lidocaine 1% Route: Intravenous (IV) IV Access: Secured Sedation: Meaningful verbal contact was maintained at all times during the procedure  Indication(s): Analgesia and Anxiety  Indications: 1. Lumbar radiculitis (Right) (L4)   2. Chronic lower extremity pain (Right)   3. DDD (degenerative disc disease), lumbar    Pain Score: Pre-procedure: 8 /10 Post-procedure: 0-No pain/10  Pre-op Assessment:  Vanessa Fisher is a 56 y.o. (year old), female patient, seen today for interventional treatment. She  has a past surgical history that includes Partial hysterectomy and Tubal ligation. Vanessa Fisher has a current medication list which includes the following prescription(s): albuterol, aspirin, cetirizine, fluticasone, glimepiride, lisinopril, pantoprazole, and tramadol, and the following Facility-Administered Medications: fentanyl and midazolam. Her primarily concern today is the Back Pain (lower and upper); Neck Pain; and Hand Pain  Initial Vital Signs: Blood pressure (!) 106/44, pulse 66, temperature 98.4 F (36.9 C), resp. rate 18, height 5\' 7"  (1.702 m), weight 240 lb (108.9 kg), SpO2 100 %. BMI: Estimated body mass index is  37.59 kg/m as calculated from the following:   Height as of this encounter: 5\' 7"  (1.702 m).   Weight as of this encounter: 240 lb (108.9 kg).  Risk Assessment: Allergies: Reviewed. She is allergic to bc powder  [aspirin-salicylamide-caffeine]; hydrocodone-acetaminophen; penicillin g; and penicillins.  Allergy Precautions: None required Coagulopathies: Reviewed. None identified.  Blood-thinner therapy: None at this time Active Infection(s): Reviewed. None identified. Vanessa Fisher is afebrile  Site Confirmation: [name] was asked to confirm the procedure and laterality before marking the site Procedure checklist: Completed Consent: Before the procedure and under the influence of no sedative(s), amnesic(s), or anxiolytics, the patient was informed of the treatment options, risks and possible complications. To fulfill our ethical and legal obligations, as recommended by the American Medical Association's Code of Ethics, I have informed the patient of my clinical impression; the nature and purpose of the treatment or procedure; the risks, benefits, and possible complications of the intervention; the alternatives, including doing nothing; the risk(s) and benefit(s) of the alternative treatment(s) or procedure(s); and the risk(s) and benefit(s) of doing nothing. The patient was provided information about the general risks and possible complications associated with the procedure. These may include, but are not limited to: failure to achieve desired goals, infection, bleeding, organ or nerve damage, allergic reactions, paralysis, and death. In addition, the patient was informed of those risks and complications associated to Spine-related procedures, such as failure to decrease pain; infection (i.e.: Meningitis, epidural or intraspinal abscess); bleeding (i.e.: epidural hematoma, subarachnoid hemorrhage, or any other type of intraspinal or peri-dural bleeding); organ or nerve damage (i.e.: Any type  of peripheral nerve, nerve root, or spinal cord injury) with subsequent damage to sensory, motor, and/or autonomic systems, resulting in permanent pain, numbness, and/or weakness of one or several areas of the body; allergic reactions; (i.e.: anaphylactic reaction); and/or  death. Furthermore, the patient was informed of those risks and complications associated with the medications. These include, but are not limited to: allergic reactions (i.e.: anaphylactic or anaphylactoid reaction(s)); adrenal axis suppression; blood sugar elevation that in diabetics may result in ketoacidosis or comma; water retention that in patients with history of congestive heart failure may result in shortness of breath, pulmonary edema, and decompensation with resultant heart failure; weight gain; swelling or edema; medication-induced neural toxicity; particulate matter embolism and blood vessel occlusion with resultant organ, and/or nervous system infarction; and/or aseptic necrosis of one or more joints. Finally, the patient was informed that Medicine is not an exact science; therefore, there is also the possibility of unforeseen or unpredictable risks and/or possible complications that may result in a catastrophic outcome. The patient indicated having understood very clearly. We have given the patient no guarantees and we have made no promises. Enough time was given to the patient to ask questions, all of which were answered to the patient's satisfaction. Vanessa Fisher has indicated that she wanted to continue with the procedure. Attestation: I, the ordering provider, attest that I have discussed with the patient the benefits, risks, side-effects, alternatives, likelihood of achieving goals, and potential problems during recovery for the procedure that I have provided informed consent. Date: 10/13/2016; Time: 2:12 PM  Pre-Procedure Preparation:  Monitoring: As per clinic protocol. Respiration, ETCO2, SpO2, BP, heart rate and  rhythm monitor placed and checked for adequate function Safety Precautions: Patient was assessed for positional comfort and pressure points before starting the procedure. Time-out: I initiated and conducted the "Time-out" before starting the procedure, as per protocol. The patient was asked to participate by confirming the accuracy of the "Time Out" information. Verification of the correct person, site, and procedure were performed and confirmed by me, the nursing staff, and the patient. "Time-out" conducted as per Joint Commission's Universal Protocol (UP.01.01.01). "Time-out" Date & Time: 10/13/2016; 1435 hrs.  Description of Procedure Process:   Position: Prone with head of the table was raised to facilitate breathing. Target Area: The interlaminar space, initially targeting the lower laminar border of the superior vertebral body. Approach: Paramedial approach. Area Prepped: Entire Posterior Lumbar Region Prepping solution: ChloraPrep (2% chlorhexidine gluconate and 70% isopropyl alcohol) Safety Precautions: Aspiration looking for blood return was conducted prior to all injections. At no point did we inject any substances, as a needle was being advanced. No attempts were made at seeking any paresthesias. Safe injection practices and needle disposal techniques used. Medications properly checked for expiration dates. SDV (single dose vial) medications used. Description of the Procedure: Protocol guidelines were followed. The procedure needle was introduced through the skin, ipsilateral to the reported pain, and advanced to the target area. Bone was contacted and the needle walked caudad, until the lamina was cleared. The epidural space was identified using "loss-of-resistance technique" with 2-3 ml of PF-NaCl (0.9% NSS), in a 5cc LOR glass syringe. Vitals:   10/13/16 1440 10/13/16 1450 10/13/16 1500 10/13/16 1510  BP: (!) 118/55 123/63 (!) 126/94 (!) 106/56  Pulse:      Resp: 17 13 19 18   Temp:   (!) 96.6 F (35.9 C)    SpO2: 99% 97% 99% 98%  Weight:      Height:        Start Time: 1435 hrs. End Time: 1442 hrs. Materials:  Needle(s) Type: Epidural needle Gauge: 17G Length: 3.5-in Medication(s): We administered lactated ringers, midazolam, fentaNYL, iopamidol, triamcinolone acetonide, ropivacaine (PF) 2 mg/mL (0.2%), sodium chloride flush, and  lidocaine. Please see chart orders for dosing details.  Imaging Guidance (Spinal):  Type of Imaging Technique: Fluoroscopy Guidance (Spinal) Indication(s): Assistance in needle guidance and placement for procedures requiring needle placement in or near specific anatomical locations not easily accessible without such assistance. Exposure Time: Please see nurses notes. Contrast: Before injecting any contrast, we confirmed that the patient did not have an allergy to iodine, shellfish, or radiological contrast. Once satisfactory needle placement was completed at the desired level, radiological contrast was injected. Contrast injected under live fluoroscopy. No contrast complications. See chart for type and volume of contrast used. Fluoroscopic Guidance: I was personally present during the use of fluoroscopy. "Tunnel Vision Technique" used to obtain the best possible view of the target area. Parallax error corrected before commencing the procedure. "Direction-depth-direction" technique used to introduce the needle under continuous pulsed fluoroscopy. Once target was reached, antero-posterior, oblique, and lateral fluoroscopic projection used confirm needle placement in all planes. Images permanently stored in EMR. Interpretation: I personally interpreted the imaging intraoperatively. Adequate needle placement confirmed in multiple planes. Appropriate spread of contrast into desired area was observed. No evidence of afferent or efferent intravascular uptake. No intrathecal or subarachnoid spread observed. Permanent images saved into the patient's  record.  Antibiotic Prophylaxis:  Indication(s): None identified Antibiotic given: None  Post-operative Assessment:  EBL: None Complications: No immediate post-treatment complications observed by team, or reported by patient. Note: The patient tolerated the entire procedure well. A repeat set of vitals were taken after the procedure and the patient was kept under observation following institutional policy, for this type of procedure. Post-procedural neurological assessment was performed, showing return to baseline, prior to discharge. The patient was provided with post-procedure discharge instructions, including a section on how to identify potential problems. Should any problems arise concerning this procedure, the patient was given instructions to immediately contact us, at any time, without hesitation. In any case, we plan to contact the patient by telephone for a follow-up status report regarding this interventional procedure. Comments:  No additional relevant information.  Plan of Care  Disposition: Discharge home  Discharge Date & Time: 10/13/2016; 1541 hrs.   Physician-requested Follow-up:  Return for post-procedure eval by Dr. Dossie Arbour in 2 weeks.  Future Appointments Date Time Provider [place]  10/28/2016 8:15 AM Milinda Pointer, MD [facility] None  12/15/2016 9:30 AM Vevelyn Francois, NP [facility] None    Imaging Orders     DG C-Arm 1-60 Min-No Report  Procedure Orders     Lumbar Epidural Injection  Medications ordered for procedure: Meds ordered this encounter  Medications  . lactated ringers infusion 1,000 mL  . midazolam (VERSED) 5 MG/5ML injection 1-2 mg    Make sure Flumazenil is available in the pyxis when using this medication. If oversedation occurs, administer 0.2 mg IV over 15 sec. If after 45 sec no response, administer 0.2 mg again over 1 min; may repeat at 1 min intervals; not to exceed 4 doses (1 mg)  . fentaNYL (SUBLIMAZE) injection 25-50 mcg     Make sure Narcan is available in the pyxis when using this medication. In the event of respiratory depression (RR< 8/min): Titrate NARCAN (naloxone) in increments of 0.1 to 0.2 mg IV at 2-3 minute intervals, until desired degree of reversal.  . iopamidol (ISOVUE-M) 41 % intrathecal injection 10 mL  . triamcinolone acetonide (KENALOG-40) injection 40 mg  . ropivacaine (PF) 2 mg/mL (0.2%) (NAROPIN) injection 2 mL  . sodium chloride flush (NS) 0.9 % injection 2 mL  .  lidocaine (XYLOCAINE) 2 % (with pres) injection 200 mg   Medications administered: We administered lactated ringers, midazolam, fentaNYL, iopamidol, triamcinolone acetonide, ropivacaine (PF) 2 mg/mL (0.2%), sodium chloride flush, and lidocaine.  See the medical record for exact dosing, route, and time of administration.  New Prescriptions   No medications on file   Primary Care Physician: Lorelee Market, MD Location: [facility] Note by: Gaspar Cola, MD Date: 10/13/2016; Time: 4:03 PM  Disclaimer:  Medicine is not an Chief Strategy Officer. The only guarantee in medicine is that nothing is guaranteed. It is important to note that the decision to proceed with this intervention was based on the information collected from the patient. The Data and conclusions were drawn from the patient's questionnaire, the interview, and the physical examination. Because the information was provided in large part by the patient, it cannot be guaranteed that it has not been purposely or unconsciously manipulated. Every effort has been made to obtain as much relevant data as possible for this evaluation. It is important to note that the conclusions that lead to this procedure are derived in large part from the available data. Always take into account that the treatment will also be dependent on availability of resources and existing treatment guidelines, considered by other Pain Management Practitioners as being common  knowledge and practice, at the time of the intervention. For Medico-Legal purposes, it is also important to point out that variation in procedural techniques and pharmacological choices are the acceptable norm. The indications, contraindications, technique, and results of the above procedure should only be interpreted and judged by a Board-Certified Interventional Pain Specialist with extensive familiarity and expertise in the same exact procedure and technique.

## 2016-10-14 ENCOUNTER — Telehealth: Payer: Self-pay | Admitting: *Deleted

## 2016-10-14 NOTE — Telephone Encounter (Signed)
Attempted to call for post procedure follow-up. Message left. 

## 2016-10-27 NOTE — Progress Notes (Signed)
Patient's Name: Vanessa Fisher  MRN: [id number]  Referring Provider: Lorelee Market, MD  DOB: [birth year]  PCP: Lorelee Market, MD  DOS: 10/28/2016  Note by: Gaspar Cola, MD  Service setting: Ambulatory outpatient  Specialty: Interventional Pain Management  Location: [facility] (AMB) Pain Management Facility    Patient type: Established   Primary Reason(s) for Visit: Encounter for post-procedure evaluation of chronic illness with mild to moderate exacerbation CC: Back Pain (lower)  HPI  Vanessa Fisher is a 56 y.o. year old, female patient, who comes today for a post-procedure evaluation. She has Chronic knee pain [facility] source of pain) (Bilateral) (R>L); Chronic pain syndrome; Long term (current) use of opiate analgesic; Long term prescription opiate use; Opiate use (30 MME/day); Chronic low back pain (Primary Source of Pain) (midline) ( (Bilateral) (L>R); Disturbance of skin sensation; Chronic neck pain (Secondary source of pain) (midline) (Bilateral) (L>R); DDD (degenerative disc disease), cervical; DDD (degenerative disc disease), lumbar; Cervical spondylosis; Cervical radicular pain (Right) (C7/C8); Upper extremity numbness (Right); Cervical facet syndrome (Bilateral) (L>R); Osteoarthritis of knee (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 Her primarily concern today is the Back Pain (lower)  Pain Assessment: Location: Lower Back Radiating: when reaching or bending goes to left side Onset: More than a month ago Duration: Chronic pain Quality: Cramping, Sharp Severity: 7 /10 (self-reported pain score)  Note: Reported level is inconsistent with clinical observations. Clinically the patient looks like a 2/10 Information on the proper use of the pain scale  provided to the patient today. When using our objective Pain Scale, any level above a 5/10 is said to belong in an emergency room, as it progressively worsens from a 6/10, which is described as severely limiting. Requiring emergency care not usually available at an outpatient pain management facility. Communication becomes difficult and requires great effort. Assistance to reach the emergency department may be required. Facial flushing and profuse sweating along with potentially dangerous increases in heart rate and blood pressure will be evident. Effect on ADL: bend, twisting,reaching Timing: Constant Modifying factors: ice,heat, medication  Vanessa Fisher comes in today for post-procedure evaluation after the treatment done on 10/13/2016.  Further details on both, my assessment(s), as well as the proposed treatment plan, please see below.  Post-Procedure Assessment  10/13/2016 Procedure: Diagnostic right-sided L4-5 interlaminar lumbar epidural steroid injection under fluoroscopic guidance and IV sedation  Pre-procedure pain score:  8/10 Post-procedure pain score: 0/10 (100% relief) Influential Factors: BMI: 37.59 kg/m Intra-procedural challenges: None observed.         Assessment challenges: None detected.              Reported side-effects: None.        Post-procedural adverse reactions or complications: None reported         Sedation: Sedation provided. When no sedatives are used, the analgesic levels obtained are directly associated to the effectiveness of the local anesthetics. However, when sedation is provided, the level of analgesia obtained during the initial 1 hour following the intervention, is believed to be the result of a combination of factors. These factors may include, but are not limited to: 1. The effectiveness of the local anesthetics used. 2. The effects of the analgesic(s) and/or anxiolytic(s) used. 3. The degree of discomfort experienced by the patient at the time of  the procedure. 4. The patients ability and reliability in recalling and recording the events. 5. The presence and influence of possible secondary gains and/or psychosocial factors. Reported result: Relief experienced during the 1st hour after the procedure: 100 % (Ultra-Short Term Relief) Vanessa Fisher has indicated area to have been numb during this time. Interpretative annotation: Clinically appropriate result. Analgesia during this period is likely to be Local Anesthetic and/or IV Sedative (Analgesic/Anxiolytic) related.          Effects of local anesthetic: The analgesic effects attained during this period are directly associated to the localized infiltration of local anesthetics and therefore cary significant diagnostic value as to the etiological location, or anatomical origin, of the pain. Expected duration of relief is directly dependent on the pharmacodynamics of the local anesthetic used. Long-acting (4-6 hours) anesthetics used.  Reported result: Relief during the next 4 to 6 hour after the procedure: 100 % (Short-Term Relief) Vanessa Fisher has indicated area to have been numb during this time. Interpretative annotation: Clinically appropriate result. Analgesia during this period is likely to be Local Anesthetic-related.          Long-term benefit: Defined as the period of time past the expected duration of local anesthetics (1 hour for short-acting and 4-6 hours for long-acting). With the possible exception of prolonged sympathetic blockade from the local anesthetics, benefits during this period are typically attributed to, or associated with, other factors such as analgesic sensory neuropraxia, antiinflammatory effects, or beneficial biochemical changes provided by agents other than the local anesthetics.  Reported result: Extended relief following procedure: 25 % (2 weeks) (Long-Term Relief) Vanessa Fisher reports the extremity pain improved more than the axial pain. Interpretative  annotation: Clinically appropriate result. Good relief. No permanent benefit expected. Inflammation plays a part in the etiology to the pain. Etiology is likely mechanical rather than inflammatory.  Current benefits: Defined as reported results that persistent at this point in time.   Analgesia: 0-25 % Ms. Celia reports the extremity pain improved more than the axial pain. The patient describes having no more lower extremity symptoms. Function: Somewhat improved ROM: Somewhat improved Interpretative annotation: Recurrence of symptoms. No permanent benefit expected. Effective diagnostic intervention.          Interpretation: Results would suggest a successful diagnostic intervention.                  Plan:  Physical examination today shows pain on hyperextension and rotation, suggestive of bilateral lumbar facet syndrome with the left side being worse than the right.       Laboratory Chemistry  Inflammation Markers (CRP: Acute Phase) (ESR: Chronic Phase) Lab Results  Component Value Date   CRP 28.3 (H) 07/08/2016   ESRSEDRATE 9 07/08/2016                 Renal Function Markers Lab Results  Component Value Date   BUN 10 07/08/2016   CREATININE 1.00 07/08/2016   GFRAA 74 07/08/2016   GFRNONAA 64 07/08/2016                 Hepatic Function Markers Lab Results  Component Value Date   AST 18 07/08/2016   ALT 17 07/08/2016   ALBUMIN 4.0 07/08/2016   ALKPHOS 121 (H) 07/08/2016                 Electrolytes Lab Results  Component Value Date   NA 143 07/08/2016   K 4.4 07/08/2016   CL 101 07/08/2016  CALCIUM 9.8 07/08/2016   MG 2.0 07/08/2016                 Neuropathy Markers Lab Results  Component Value Date   VITAMINB12 775 07/08/2016                 Bone Pathology Markers Lab Results  Component Value Date   ALKPHOS 121 (H) 07/08/2016   25OHVITD1 21 (L) 07/08/2016   25OHVITD2 1.4 07/08/2016   25OHVITD3 20 07/08/2016   CALCIUM 9.8 07/08/2016                  Coagulation Parameters Lab Results  Component Value Date   PLT 293 05/27/2014                 Cardiovascular Markers Lab Results  Component Value Date   BNP 34 05/01/2011   HGB 11.9 (L) 05/27/2014   HCT 36.4 05/27/2014                 Note: Lab results reviewed.  Recent Diagnostic Imaging Results  DG C-Arm 1-60 Min-No Report Fluoroscopy was utilized by the requesting physician.  No radiographic  interpretation.   Note: Imaging results reviewed.          Meds   Current Outpatient Prescriptions:  .  albuterol (PROVENTIL HFA;VENTOLIN HFA) 108 (90 BASE) MCG/ACT inhaler, Inhale 2 puffs into the lungs every 6 (six) hours as needed for wheezing or shortness of breath., Disp: , Rfl:  .  aspirin 81 MG tablet, Take 81 mg by mouth daily., Disp: , Rfl:  .  cetirizine (ZYRTEC) 10 MG tablet, Take 10 mg by mouth daily., Disp: , Rfl:  .  fluticasone (FLONASE) 50 MCG/ACT nasal spray, Place 1 spray into both nostrils daily., Disp: , Rfl:  .  glimepiride (AMARYL) 4 MG tablet, Take 4 mg by mouth daily with breakfast., Disp: , Rfl:  .  levofloxacin (LEVAQUIN) 500 MG tablet, TAKE 1 TABLET BY MOUTH DAILY FOR INFECTION, Disp: , Rfl: 0 .  lisinopril (PRINIVIL,ZESTRIL) 10 MG tablet, Take 10 mg by mouth daily., Disp: , Rfl: 3 .  pantoprazole (PROTONIX) 40 MG tablet, Take 40 mg by mouth daily., Disp: , Rfl:  .  traMADol (ULTRAM) 50 MG tablet, Take 1 tablet (50 mg total) by mouth every 6 (six) hours as needed for severe pain., Disp: 120 tablet, Rfl: 2  ROS  Constitutional: Denies any fever or chills Gastrointestinal: No reported hemesis, hematochezia, vomiting, or acute GI distress Musculoskeletal: Denies any acute onset joint swelling, redness, loss of ROM, or weakness Neurological: No reported episodes of acute onset apraxia, aphasia, dysarthria, agnosia, amnesia, paralysis, loss of coordination, or loss of consciousness  Allergies  Ms. Burkley is allergic to bc powder   [aspirin-salicylamide-caffeine]; hydrocodone-acetaminophen; penicillin g; and penicillins.  PFSH  Drug: Ms. Lepp  reports that she does not use drugs. Alcohol:  reports that she does not drink alcohol. Tobacco:  reports that she has never smoked. She has never used smokeless tobacco. Medical:  has a past medical history of Asthma; Bile acid malabsorption syndrome; Depression; Diabetes mellitus without complication ([place]); Diverticulitis; Gastric reflux; and Hand pain (05/12/2016). Surgical: Ms. Petsch  has a past surgical history that includes Partial hysterectomy and Tubal ligation. Family: family history is not on file.  Constitutional Exam  General appearance: Well nourished, well developed, and well hydrated. In no apparent acute distress Vitals:   10/28/16 0835  BP: (!) 95/45  Pulse: 80  Resp: 16  Temp: 98 F (36.7 C)  TempSrc: Oral  SpO2: 100%  Weight: 240 lb (108.9 kg)  Height: '5\' 7"'  (1.702 m)   BMI Assessment: Estimated body mass index is 37.59 kg/m as calculated from the following:   Height as of this encounter: '5\' 7"'  (1.702 m).   Weight as of this encounter: 240 lb (108.9 kg).  BMI interpretation table: BMI level Category Range association with higher incidence of chronic pain  <18 kg/m2 Underweight   18.5-24.9 kg/m2 Ideal body weight   25-29.9 kg/m2 Overweight Increased incidence by 20%  30-34.9 kg/m2 Obese (Class I) Increased incidence by 68%  35-39.9 kg/m2 Severe obesity (Class II) Increased incidence by 136%  >40 kg/m2 Extreme obesity (Class III) Increased incidence by 254%   BMI Readings from Last 4 Encounters:  10/28/16 37.59 kg/m  10/13/16 37.59 kg/m  09/28/16 37.59 kg/m  07/08/16 37.59 kg/m   Wt Readings from Last 4 Encounters:  10/28/16 240 lb (108.9 kg)  10/13/16 240 lb (108.9 kg)  09/28/16 240 lb (108.9 kg)  07/08/16 240 lb (108.9 kg)  Psych/Mental status: Alert, oriented x 3 (person, place, & time)       Eyes: PERLA Respiratory:  No evidence of acute respiratory distress  Cervical Spine Area Exam  Skin & Axial Inspection: No masses, redness, edema, swelling, or associated skin lesions Alignment: Symmetrical Functional ROM: Decreased ROM      Stability: No instability detected Muscle Tone/Strength: Functionally intact. No obvious neuro-muscular anomalies detected. Sensory (Neurological): Movement-associated discomfort Palpation: Complains of area being tender to palpation              Upper Extremity (UE) Exam    Side: Right upper extremity  Side: Left upper extremity  Skin & Extremity Inspection: Skin color, temperature, and hair growth are WNL. No peripheral edema or cyanosis. No masses, redness, swelling, asymmetry, or associated skin lesions. No contractures.  Skin & Extremity Inspection: Skin color, temperature, and hair growth are WNL. No peripheral edema or cyanosis. No masses, redness, swelling, asymmetry, or associated skin lesions. No contractures.  Functional ROM: Unrestricted ROM          Functional ROM: Unrestricted ROM          Muscle Tone/Strength: Functionally intact. No obvious neuro-muscular anomalies detected.  Muscle Tone/Strength: Functionally intact. No obvious neuro-muscular anomalies detected.  Sensory (Neurological): Unimpaired          Sensory (Neurological): Unimpaired          Palpation: No palpable anomalies              Palpation: No palpable anomalies              Specialized Test(s): Deferred         Specialized Test(s): Deferred          Thoracic Spine Area Exam  Skin & Axial Inspection: No masses, redness, or swelling Alignment: Symmetrical Functional ROM: Unrestricted ROM Stability: No instability detected Muscle Tone/Strength: Functionally intact. No obvious neuro-muscular anomalies detected. Sensory (Neurological): Unimpaired Muscle strength & Tone: No palpable anomalies  Lumbar Spine Area Exam  Skin & Axial Inspection: No masses, redness, or swelling Alignment:  Symmetrical Functional ROM: Decreased ROM      Stability: No instability detected Muscle Tone/Strength: Functionally intact. No obvious neuro-muscular anomalies detected. Sensory (Neurological): Movement-associated pain Palpation: Complains of area being tender to palpation       Provocative Tests: Lumbar Hyperextension and rotation test: Positive bilaterally for facet joint pain.  (L>R) Lumbar Lateral bending  test: evaluation deferred today       Patrick's Maneuver: evaluation deferred today                    Gait & Posture Assessment  Ambulation: Unassisted Gait: Relatively normal for age and body habitus Posture: WNL   Lower Extremity Exam    Side: Right lower extremity  Side: Left lower extremity  Skin & Extremity Inspection: Skin color, temperature, and hair growth are WNL. No peripheral edema or cyanosis. No masses, redness, swelling, asymmetry, or associated skin lesions. No contractures.  Skin & Extremity Inspection: Skin color, temperature, and hair growth are WNL. No peripheral edema or cyanosis. No masses, redness, swelling, asymmetry, or associated skin lesions. No contractures.  Functional ROM: Unrestricted ROM          Functional ROM: Unrestricted ROM          Muscle Tone/Strength: Functionally intact. No obvious neuro-muscular anomalies detected.  Muscle Tone/Strength: Functionally intact. No obvious neuro-muscular anomalies detected.  Sensory (Neurological): Unimpaired  Sensory (Neurological): Unimpaired  Palpation: No palpable anomalies  Palpation: No palpable anomalies   Assessment  Primary Diagnosis & Pertinent Problem List: The primary encounter diagnosis was Lumbar radiculitis (Right) (L4). Diagnoses of Chronic lower extremity pain (Right), Lumbar facet syndrome (Bilateral) (L>R), Chronic low back pain (Primary Source of Pain) (midline) ( (Bilateral) (L>R), Grade 1 Anterolisthesis of L4 over L5 (5 mm), DDD (degenerative disc disease), lumbar, and Chronic pain syndrome  were also pertinent to this visit.  Status Diagnosis  Controlled Controlled Controlled 1. Lumbar radiculitis (Right) (L4)   2. Chronic lower extremity pain (Right)   3. Lumbar facet syndrome (Bilateral) (L>R)   4. Chronic low back pain (Primary Source of Pain) (midline) ( (Bilateral) (L>R)   5. Grade 1 Anterolisthesis of L4 over L5 (5 mm)   6. DDD (degenerative disc disease), lumbar   7. Chronic pain syndrome     Problems updated and reviewed during this visit: Problem  Chronic low back pain (Primary Source of Pain) (midline) ( (Bilateral) (L>R)  Lumbar facet syndrome (Bilateral) (L>R)  Grade 1 Anterolisthesis of L4 over L5 (3 mm)  Grade 1 Retrolisthesis of L5 over S1 (5 mm)   Plan of Care  Pharmacotherapy (Medications Ordered): No orders of the defined types were placed in this encounter.  New Prescriptions   No medications on file   Medications administered today: Ms. Caterino had no medications administered during this visit.   Procedure Orders     LUMBAR FACET(MEDIAL BRANCH NERVE BLOCK) [facility]NB     LUMBAR FACET(MEDIAL BRANCH NERVE BLOCK) [facility]NB Lab Orders  No laboratory test(s) ordered today   Imaging Orders  No imaging studies ordered today   Referral Orders  No referral(s) requested today    Interventional management options: Planned, scheduled, and/or pending:   Diagnostic bilateral lumbar facetblock    Considering:   Diagnostic bilateral lumbar facetblock  Possible bilateral lumbar facet RFA Diagnostic right L3-4 translaminar lumbar epiduralsteroid injection  Diagnostic right L3-4 transforaminal epiduralsteroid injection  Diagnostic right L4-5 transforaminal epidural steroid injection  Diagnostic rightcervical epiduralsteroid injection  Diagnostic bilateral cervical facet block Possible bilateral cervical facet RFA Diagnostic bilateral intra-articular knee joint injection Possible bilateral intra-articular Hyalgan knee injectionseries   Diagnostic bilateral Genicular nerve block Possible bilateral Genicular nerve RFA   Palliative PRN treatment(s):   Palliative right-sided L4-5 interlaminar lumbar epidural steroid injection + bilateral L4-5 transforaminal epidural steroid injection under fluoroscopic guidance and IV sedation  Diagnostic bilateral intra-articular knee joint  injection Diagnostic bilateral lumbar facetblock    Provider-requested follow-up: Return for Procedure (with sedation): (B) L-FCT Blk.  Future Appointments Date Time Provider [place]  11/03/2016 8:00 AM Milinda Pointer, MD [facility] None  12/15/2016 9:30 AM Vevelyn Francois, NP [facility] None   Primary Care Physician: Lorelee Market, MD Location: [facility] Note by: Gaspar Cola, MD Date: 10/28/2016; Time: 9:19 AM

## 2016-10-28 ENCOUNTER — Encounter: Payer: Self-pay | Admitting: Pain Medicine

## 2016-10-28 ENCOUNTER — Ambulatory Visit: Payer: Medicare Other | Attending: Pain Medicine | Admitting: Pain Medicine

## 2016-10-28 VITALS — BP 95/45 | HR 80 | Temp 98.0°F | Resp 16 | Ht 67.0 in | Wt 240.0 lb

## 2016-10-28 DIAGNOSIS — F329 Major depressive disorder, single episode, unspecified: Secondary | ICD-10-CM | POA: Diagnosis not present

## 2016-10-28 DIAGNOSIS — G8929 Other chronic pain: Secondary | ICD-10-CM | POA: Diagnosis not present

## 2016-10-28 DIAGNOSIS — M4316 Spondylolisthesis, lumbar region: Secondary | ICD-10-CM | POA: Diagnosis not present

## 2016-10-28 DIAGNOSIS — M4310 Spondylolisthesis, site unspecified: Secondary | ICD-10-CM

## 2016-10-28 DIAGNOSIS — J45909 Unspecified asthma, uncomplicated: Secondary | ICD-10-CM | POA: Diagnosis not present

## 2016-10-28 DIAGNOSIS — M545 Low back pain: Secondary | ICD-10-CM | POA: Diagnosis present

## 2016-10-28 DIAGNOSIS — E119 Type 2 diabetes mellitus without complications: Secondary | ICD-10-CM | POA: Diagnosis not present

## 2016-10-28 DIAGNOSIS — Z7982 Long term (current) use of aspirin: Secondary | ICD-10-CM | POA: Insufficient documentation

## 2016-10-28 DIAGNOSIS — M5412 Radiculopathy, cervical region: Secondary | ICD-10-CM | POA: Diagnosis not present

## 2016-10-28 DIAGNOSIS — M170 Bilateral primary osteoarthritis of knee: Secondary | ICD-10-CM | POA: Diagnosis not present

## 2016-10-28 DIAGNOSIS — M79604 Pain in right leg: Secondary | ICD-10-CM

## 2016-10-28 DIAGNOSIS — K219 Gastro-esophageal reflux disease without esophagitis: Secondary | ICD-10-CM | POA: Insufficient documentation

## 2016-10-28 DIAGNOSIS — Z79891 Long term (current) use of opiate analgesic: Secondary | ICD-10-CM | POA: Insufficient documentation

## 2016-10-28 DIAGNOSIS — G894 Chronic pain syndrome: Secondary | ICD-10-CM | POA: Diagnosis not present

## 2016-10-28 DIAGNOSIS — M47816 Spondylosis without myelopathy or radiculopathy, lumbar region: Secondary | ICD-10-CM

## 2016-10-28 DIAGNOSIS — M5416 Radiculopathy, lumbar region: Secondary | ICD-10-CM

## 2016-10-28 DIAGNOSIS — M4696 Unspecified inflammatory spondylopathy, lumbar region: Secondary | ICD-10-CM

## 2016-10-28 DIAGNOSIS — M5136 Other intervertebral disc degeneration, lumbar region: Secondary | ICD-10-CM

## 2016-10-28 DIAGNOSIS — E559 Vitamin D deficiency, unspecified: Secondary | ICD-10-CM | POA: Insufficient documentation

## 2016-10-28 DIAGNOSIS — Z7984 Long term (current) use of oral hypoglycemic drugs: Secondary | ICD-10-CM | POA: Insufficient documentation

## 2016-10-28 NOTE — Patient Instructions (Addendum)
____________________________________________________________________________________________  Pain Scale  Introduction: The pain score used by this practice is the Verbal Numerical Rating Scale (VNRS-11). This is an 11-point scale. It is for adults and children 10 years or older. There are significant differences in how the pain score is reported, used, and applied. Forget everything you learned in the past and learn this scoring system.  General Information: The scale should reflect your current level of pain. Unless you are specifically asked for the level of your worst pain, or your average pain. If you are asked for one of these two, then it should be understood that it is over the past 24 hours.  Basic Activities of Daily Living (ADL): Personal hygiene, dressing, eating, transferring, and using restroom.  Instructions: Most patients tend to report their level of pain as a combination of two factors, their physical pain and their psychosocial pain. This last one is also known as "suffering" and it is reflection of how physical pain affects you socially and psychologically. From now on, report them separately. From this point on, when asked to report your pain level, report only your physical pain. Use the following table for reference.  Pain Clinic Pain Levels (0-5/10)  Pain Level Score  Description  No Pain 0   Mild pain 1 Nagging, annoying, but does not interfere with basic activities of daily living (ADL). Patients are able to eat, bathe, get dressed, toileting (being able to get on and off the toilet and perform personal hygiene functions), transfer (move in and out of bed or a chair without assistance), and maintain continence (able to control bladder and bowel functions). Blood pressure and heart rate are unaffected. A normal heart rate for a healthy adult ranges from 60 to 100 bpm (beats per minute).   Mild to moderate pain 2 Noticeable and distracting. Impossible to hide from other  people. More frequent flare-ups. Still possible to adapt and function close to normal. It can be very annoying and may have occasional stronger flare-ups. With discipline, patients may get used to it and adapt.   Moderate pain 3 Interferes significantly with activities of daily living (ADL). It becomes difficult to feed, bathe, get dressed, get on and off the toilet or to perform personal hygiene functions. Difficult to get in and out of bed or a chair without assistance. Very distracting. With effort, it can be ignored when deeply involved in activities.   Moderately severe pain 4 Impossible to ignore for more than a few minutes. With effort, patients may still be able to manage work or participate in some social activities. Very difficult to concentrate. Signs of autonomic nervous system discharge are evident: dilated pupils (mydriasis); mild sweating (diaphoresis); sleep interference. Heart rate becomes elevated (>115 bpm). Diastolic blood pressure (lower number) rises above 100 mmHg. Patients find relief in laying down and not moving.   Severe pain 5 Intense and extremely unpleasant. Associated with frowning face and frequent crying. Pain overwhelms the senses.  Ability to do any activity or maintain social relationships becomes significantly limited. Conversation becomes difficult. Pacing back and forth is common, as getting into a comfortable position is nearly impossible. Pain wakes you up from deep sleep. Physical signs will be obvious: pupillary dilation; increased sweating; goosebumps; brisk reflexes; cold, clammy hands and feet; nausea, vomiting or dry heaves; loss of appetite; significant sleep disturbance with inability to fall asleep or to remain asleep. When persistent, significant weight loss is observed due to the complete loss of appetite and sleep deprivation.  Blood   pressure and heart rate becomes significantly elevated. Caution: If elevated blood pressure triggers a pounding headache  associated with blurred vision, then the patient should immediately seek attention at an urgent or emergency care unit, as these may be signs of an impending stroke.    Emergency Department Pain Levels (6-10/10)  Emergency Room Pain 6 Severely limiting. Requires emergency care and should not be seen or managed at an outpatient pain management facility. Communication becomes difficult and requires great effort. Assistance to reach the emergency department may be required. Facial flushing and profuse sweating along with potentially dangerous increases in heart rate and blood pressure will be evident.   Distressing pain 7 Self-care is very difficult. Assistance is required to transport, or use restroom. Assistance to reach the emergency department will be required. Tasks requiring coordination, such as bathing and getting dressed become very difficult.   Disabling pain 8 Self-care is no longer possible. At this level, pain is disabling. The individual is unable to do even the most "basic" activities such as walking, eating, bathing, dressing, transferring to a bed, or toileting. Fine motor skills are lost. It is difficult to think clearly.   Incapacitating pain 9 Pain becomes incapacitating. Thought processing is no longer possible. Difficult to remember your own name. Control of movement and coordination are lost.   The worst pain imaginable 10 At this level, most patients pass out from pain. When this level is reached, collapse of the autonomic nervous system occurs, leading to a sudden drop in blood pressure and heart rate. This in turn results in a temporary and dramatic drop in blood flow to the brain, leading to a loss of consciousness. Fainting is one of the body's self defense mechanisms. Passing out puts the brain in a calmed state and causes it to shut down for a while, in order to begin the healing process.    Summary: 1. Refer to this scale when providing [place] with your pain level. 2. Be  accurate and careful when reporting your pain level. This will help with your care. 3. Over-reporting your pain level will lead to loss of credibility. 4. Even a level of 1/10 means that there is pain and will be treated at our facility. 5. High, inaccurate reporting will be documented as "Symptom Exaggeration", leading to loss of credibility and suspicions of possible secondary gains such as obtaining more narcotics, or wanting to appear disabled, for fraudulent reasons. 6. Only pain levels of 5 or below will be seen at our facility. 7. Pain levels of 6 and above will be sent to the Emergency Department and the appointment cancelled. ____________________________________________________________________________________________  ____________________________________________________________________________________________  Preparing for Procedure with Sedation Instructions: . Oral Intake: Do not eat or drink anything for at least 8 hours prior to your procedure. . Transportation: Public transportation is not allowed. Bring an adult driver. The driver must be physically present in our waiting room before any procedure can be started. Marland Kitchen Physical Assistance: Bring an adult physically capable of assisting you, in the event you need help. This adult should keep you company at home for at least 6 hours after the procedure. . Blood Pressure Medicine: Take your blood pressure medicine with a sip of water the morning of the procedure. . Blood thinners:  . Diabetics on insulin: Notify the staff so that you can be scheduled 1st case in the morning. If your diabetes requires high dose insulin, take only  of your normal insulin dose the morning of the procedure and notify the  staff that you have done so. . Preventing infections: Shower with an antibacterial soap the morning of your procedure. . Build-up your immune system: Take 1000 mg of Vitamin C with every meal (3 times a day) the day prior to your  procedure. Marland Kitchen Antibiotics: Inform the staff if you have a condition or reason that requires you to take antibiotics before dental procedures. . Pregnancy: If you are pregnant, call and cancel the procedure. . Sickness: If you have a cold, fever, or any active infections, call and cancel the procedure. . Arrival: You must be in the facility at least 30 minutes prior to your scheduled procedure. . Children: Do not bring children with you. . Dress appropriately: Bring dark clothing that you would not mind if they get stained. . Valuables: Do not bring any jewelry or valuables. Procedure appointments are reserved for interventional treatments only. Marland Kitchen No Prescription Refills. . No medication changes will be discussed during procedure appointments. . No disability issues will be discussed. ____________________________________________________________________________________________   Facet Joint Block The facet joints connect the bones of the spine (vertebrae). They make it possible for you to bend, twist, and make other movements with your spine. They also keep you from bending too far, twisting too far, and making other excessive movements. A facet joint block is a procedure where a numbing medicine (anesthetic) is injected into a facet joint. Often, a type of anti-inflammatory medicine called a steroid is also injected. A facet joint block may be done to diagnose neck or back pain. If the pain gets better after a facet joint block, it means the pain is probably coming from the facet joint. If the pain does not get better, it means the pain is probably not coming from the facet joint. A facet joint block may also be done to relieve neck or back pain caused by an inflamed facet joint. A facet joint block is only done to relieve pain if the pain does not improve with other methods, such as medicine, exercise programs, and physical therapy. Tell a health care provider about:  Any allergies you  have.  All medicines you are taking, including vitamins, herbs, eye drops, creams, and over-the-counter medicines.  Any problems you or family members have had with anesthetic medicines.  Any blood disorders you have.  Any surgeries you have had.  Any medical conditions you have.  Whether you are pregnant or may be pregnant. What are the risks? Generally, this is a safe procedure. However, problems may occur, including:  Bleeding.  Injury to a nerve near the injection site.  Pain at the injection site.  Weakness or numbness in areas controlled by nerves near the injection site.  Infection.  Temporary fluid retention.  Allergic reactions to medicines or dyes.  Injury to other structures or organs near the injection site.  What happens before the procedure?  Follow instructions from your health care provider about eating or drinking restrictions.  Ask your health care provider about: ? Changing or stopping your regular medicines. This is especially important if you are taking diabetes medicines or blood thinners. ? Taking medicines such as aspirin and ibuprofen. These medicines can thin your blood. Do not take these medicines before your procedure if your health care provider instructs you not to.  Do not take any new dietary supplements or medicines without asking your health care provider first.  Plan to have someone take you home after the procedure. What happens during the procedure?  You may need to remove  your clothing and dress in an open-back gown.  The procedure will be done while you are lying on an X-ray table. You will most likely be asked to lie on your stomach, but you may be asked to lie in a different position if an injection will be made in your neck.  Machines will be used to monitor your oxygen levels, heart rate, and blood pressure.  If an injection will be made in your neck, an IV tube will be inserted into one of your veins. Fluids and medicine  will flow directly into your body through the IV tube.  The area over the facet joint where the injection will be made will be cleaned with soap. The surrounding skin will be covered with clean drapes.  A numbing medicine (local anesthetic) will be applied to your skin. Your skin may sting or burn for a moment.  A video X-ray machine (fluoroscopy) will be used to locate the joint. In some cases, a CT scan may be used.  A contrast dye may be injected into the facet joint area to help locate the joint.  When the joint is located, an anesthetic will be injected into the joint through the needle.  Your health care provider will ask you whether you feel pain relief. If you do feel relief, a steroid may be injected to provide pain relief for a longer period of time. If you do not feel relief or feel only partial relief, additional injections of an anesthetic may be made in other facet joints.  The needle will be removed.  Your skin will be cleaned.  A bandage (dressing) will be applied over each injection site. The procedure may vary among health care providers and hospitals. What happens after the procedure?  You will be observed for 15-30 minutes before being allowed to go home. This information is not intended to replace advice given to you by your health care provider. Make sure you discuss any questions you have with your health care provider. Document Released: 06/10/2006 Document Revised: 02/20/2015 Document Reviewed: 10/15/2014 Elsevier Interactive Patient Education  2018 [place]  What are the risk, side effects and possible complications? Generally speaking, most procedures are safe.  However, with any procedure there are risks, side effects, and the possibility of complications.  The risks and complications are dependent upon the sites that are lesioned, or the type of nerve block to be performed.  The closer the procedure is to the spine, the  more serious the risks are.  Great care is taken when placing the radio frequency needles, block needles or lesioning probes, but sometimes complications can occur. 1. Infection: Any time there is an injection through the skin, there is a risk of infection.  This is why sterile conditions are used for these blocks.  There are four possible types of infection. 1. Localized skin infection. 2. Central Nervous System Infection-This can be in the form of Meningitis, which can be deadly. 3. Epidural Infections-This can be in the form of an epidural abscess, which can cause pressure inside of the spine, causing compression of the spinal cord with subsequent paralysis. This would require an emergency surgery to decompress, and there are no guarantees that the patient would recover from the paralysis. 4. Discitis-This is an infection of the intervertebral discs.  It occurs in about 1% of discography procedures.  It is difficult to treat and it may lead to surgery.        2. Pain:  the needles have to go through skin and soft tissues, will cause soreness.       3. Damage to internal structures:  The nerves to be lesioned may be near blood vessels or    other nerves which can be potentially damaged.       4. Bleeding: Bleeding is more common if the patient is taking blood thinners such as  aspirin, Coumadin, Ticiid, Plavix, etc., or if he/she have some genetic predisposition  such as hemophilia. Bleeding into the spinal canal can cause compression of the spinal  cord with subsequent paralysis.  This would require an emergency surgery to  decompress and there are no guarantees that the patient would recover from the  paralysis.       5. Pneumothorax:  Puncturing of a lung is a possibility, every time a needle is introduced in  the area of the chest or upper back.  Pneumothorax refers to free air around the  collapsed lung(s), inside of the thoracic cavity (chest cavity).  Another two possible  complications related to  a similar event would include: Hemothorax and Chylothorax.   These are variations of the Pneumothorax, where instead of air around the collapsed  lung(s), you may have blood or chyle, respectively.       6. Spinal headaches: They may occur with any procedures in the area of the spine.       7. Persistent CSF (Cerebro-Spinal Fluid) leakage: This is a rare problem, but may occur  with prolonged intrathecal or epidural catheters either due to the formation of a fistulous  track or a dural tear.       8. Nerve damage: By working so close to the spinal cord, there is always a possibility of  nerve damage, which could be as serious as a permanent spinal cord injury with  paralysis.       9. Death:  Although rare, severe deadly allergic reactions known as "Anaphylactic  reaction" can occur to any of the medications used.      10. Worsening of the symptoms:  We can always make thing worse.  What are the chances of something like this happening? Chances of any of this occuring are extremely low.  By statistics, you have more of a chance of getting killed in a motor vehicle accident: while driving to the hospital than any of the above occurring .  Nevertheless, you should be aware that they are possibilities.  In general, it is similar to taking a shower.  Everybody knows that you can slip, hit your head and get killed.  Does that mean that you should not shower again?  Nevertheless always keep in mind that statistics do not mean anything if you happen to be on the wrong side of them.  Even if a procedure has a 1 (one) in a 1,000,000 (million) chance of going wrong, it you happen to be that one..Also, keep in mind that by statistics, you have more of a chance of having something go wrong when taking medications.  Who should not have this procedure? If you are on a blood thinning medication (e.g. Coumadin, Plavix, see list of "Blood Thinners"), or if you have an active infection going on, you should not have the  procedure.  If you are taking any blood thinners, please inform your physician.  How should I prepare for this procedure?  Do not eat or drink anything at least six hours prior to the procedure.  Bring a driver with you .  It cannot be a taxi.  Come accompanied by an adult that can drive you back, and that is strong enough to help you if your legs get weak or numb from the local anesthetic.  Take all of your medicines the morning of the procedure with just enough water to swallow them.  If you have diabetes, make sure that you are scheduled to have your procedure done first thing in the morning, whenever possible.  If you have diabetes, take only half of your insulin dose and notify our nurse that you have done so as soon as you arrive at the clinic.  If you are diabetic, but only take blood sugar pills (oral hypoglycemic), then do not take them on the morning of your procedure.  You may take them after you have had the procedure.  Do not take aspirin or any aspirin-containing medications, at least eleven (11) days prior to the procedure.  They may prolong bleeding.  Wear loose fitting clothing that may be easy to take off and that you would not mind if it got stained with Betadine or blood.  Do not wear any jewelry or perfume  Remove any nail coloring.  It will interfere with some of our monitoring equipment.  NOTE: Remember that this is not meant to be interpreted as a complete list of all possible complications.  Unforeseen problems may occur.  BLOOD THINNERS The following drugs contain aspirin or other products, which can cause increased bleeding during surgery and should not be taken for 2 weeks prior to and 1 week after surgery.  If you should need take something for relief of minor pain, you may take acetaminophen which is found in Tylenol,m Datril, Anacin-3 and Panadol. It is not blood thinner. The products listed below are.  Do not take any of the products listed below in  addition to any listed on your instruction sheet.  A.P.C or A.P.C with Codeine Codeine Phosphate Capsules #3 Ibuprofen Ridaura  ABC compound Congesprin Imuran rimadil  Advil Cope Indocin Robaxisal  Alka-Seltzer Effervescent Pain Reliever and Antacid Coricidin or Coricidin-D  Indomethacin Rufen  Alka-Seltzer plus Cold Medicine Cosprin Ketoprofen S-A-C Tablets  Anacin Analgesic Tablets or Capsules Coumadin Korlgesic Salflex  Anacin Extra Strength Analgesic tablets or capsules CP-2 Tablets Lanoril Salicylate  Anaprox Cuprimine Capsules Levenox Salocol  Anexsia-D Dalteparin Magan Salsalate  Anodynos Darvon compound Magnesium Salicylate Sine-off  Ansaid Dasin Capsules Magsal Sodium Salicylate  Anturane Depen Capsules Marnal Soma  APF Arthritis pain formula Dewitt's Pills Measurin Stanback  Argesic Dia-Gesic Meclofenamic Sulfinpyrazone  Arthritis Bayer Timed Release Aspirin Diclofenac Meclomen Sulindac  Arthritis pain formula Anacin Dicumarol Medipren Supac  Analgesic (Safety coated) Arthralgen Diffunasal Mefanamic Suprofen  Arthritis Strength Bufferin Dihydrocodeine Mepro Compound Suprol  Arthropan liquid Dopirydamole Methcarbomol with Aspirin Synalgos  ASA tablets/Enseals Disalcid Micrainin Tagament  Ascriptin Doan's Midol Talwin  Ascriptin A/D Dolene Mobidin Tanderil  Ascriptin Extra Strength Dolobid Moblgesic Ticlid  Ascriptin with Codeine Doloprin or Doloprin with Codeine Momentum Tolectin  Asperbuf Duoprin Mono-gesic Trendar  Aspergum Duradyne Motrin or Motrin IB Triminicin  Aspirin plain, buffered or enteric coated Durasal Myochrisine Trigesic  Aspirin Suppositories Easprin Nalfon Trillsate  Aspirin with Codeine Ecotrin Regular or Extra Strength Naprosyn Uracel  Atromid-S Efficin Naproxen Ursinus  Auranofin Capsules Elmiron Neocylate Vanquish  Axotal Emagrin Norgesic Verin  Azathioprine Empirin or Empirin with Codeine Normiflo Vitamin E  Azolid Emprazil Nuprin Voltaren  Bayer  Aspirin plain, buffered or children's or timed BC Tablets or powders Encaprin Orgaran Warfarin Sodium  Buff-a-Comp Enoxaparin Orudis Zorpin  Buff-a-Comp with Codeine Equegesic Os-Cal-Gesic   Buffaprin Excedrin plain, buffered or Extra Strength Oxalid   Bufferin Arthritis Strength Feldene Oxphenbutazone   Bufferin plain or Extra Strength Feldene Capsules Oxycodone with Aspirin   Bufferin with Codeine Fenoprofen Fenoprofen Pabalate or Pabalate-SF   Buffets II Flogesic Panagesic   Buffinol plain or Extra Strength Florinal or Florinal with Codeine Panwarfarin   Buf-Tabs Flurbiprofen Penicillamine   Butalbital Compound Four-way cold tablets Penicillin   Butazolidin Fragmin Pepto-Bismol   Carbenicillin Geminisyn Percodan   Carna Arthritis Reliever Geopen Persantine   Carprofen Gold's salt Persistin   Chloramphenicol Goody's Phenylbutazone   Chloromycetin Haltrain Piroxlcam   Clmetidine heparin Plaquenil   Cllnoril Hyco-pap Ponstel   Clofibrate Hydroxy chloroquine Propoxyphen         Before stopping any of these medications, be sure to consult the physician who ordered them.  Some, such as Coumadin (Warfarin) are ordered to prevent or treat serious conditions such as "deep thrombosis", "pumonary embolisms", and other heart problems.  The amount of time that you may need off of the medication may also vary with the medication and the reason for which you were taking it.  If you are taking any of these medications, please make sure you notify your pain physician before you undergo any procedures.

## 2016-10-28 NOTE — Progress Notes (Signed)
Safety precautions to be maintained throughout the outpatient stay will include: orient to surroundings, keep bed in low position, maintain call bell within reach at all times, provide assistance with transfer out of bed and ambulation.  

## 2016-11-03 ENCOUNTER — Ambulatory Visit: Payer: Medicare Other | Admitting: Pain Medicine

## 2016-11-03 NOTE — Progress Notes (Deleted)
Patient's Name: Vanessa Fisher  MRN: [id number]  Referring Provider: Milinda Pointer, MD  DOB: Nov 21, 1960  PCP: Lorelee Market, MD  DOS: 11/03/2016  Note by: Gaspar Cola, MD  Service setting: Ambulatory outpatient  Specialty: Interventional Pain Management  Patient type: Established  Location: [facility] (AMB) Pain Management Facility  Visit type: Interventional Procedure   Primary Reason for Visit: Interventional Pain Management Treatment. CC: No chief complaint on file.  Procedure:  Anesthesia, Analgesia, Anxiolysis:  Type: Diagnostic Medial Branch Facet Block Region: Lumbar Level: L2, L3, L4, L5, & S1 Medial Branch Level(s) Laterality: Bilateral  Type: Local Anesthesia with Moderate (Conscious) Sedation Local Anesthetic: Lidocaine 1% Route: Intravenous (IV) IV Access: Secured Sedation: Meaningful verbal contact was maintained at all times during the procedure  Indication(s): Analgesia and Anxiety   Indications: 1. Lumbar facet syndrome (Bilateral) (L>R)   2. DDD (degenerative disc disease), lumbar   3. Chronic low back pain (Primary Source of Pain) (midline) ( (Bilateral) (L>R)    Pain Score: Pre-procedure:  /10 Post-procedure:  /10  Pre-op Assessment:  Vanessa Fisher is a 56 y.o. (year old), female patient, seen today for interventional treatment. She  has a past surgical history that includes Partial hysterectomy and Tubal ligation. Vanessa Fisher has a current medication list which includes the following prescription(s): albuterol, aspirin, cetirizine, fluticasone, glimepiride, levofloxacin, lisinopril, pantoprazole, and tramadol. Her primarily concern today is the No chief complaint on file.  Initial Vital Signs: There were no vitals taken for this visit. BMI: Estimated body mass index is 37.59 kg/m as calculated from the following:   Height as of 10/28/16: 5\' 7"  (1.702 m).   Weight as of 10/28/16: 240 lb (108.9 kg).  Risk Assessment: Allergies: Reviewed.  She is allergic to bc powder  [aspirin-salicylamide-caffeine]; hydrocodone-acetaminophen; penicillin g; and penicillins.  Allergy Precautions: None required Coagulopathies: Reviewed. None identified.  Blood-thinner therapy: None at this time Active Infection(s): Reviewed. None identified. Vanessa Fisher is afebrile  Site Confirmation: Vanessa Fisher was asked to confirm the procedure and laterality before marking the site Procedure checklist: Completed Consent: Before the procedure and under the influence of no sedative(s), amnesic(s), or anxiolytics, the patient was informed of the treatment options, risks and possible complications. To fulfill our ethical and legal obligations, as recommended by the American Medical Association's Code of Ethics, I have informed the patient of my clinical impression; the nature and purpose of the treatment or procedure; the risks, benefits, and possible complications of the intervention; the alternatives, including doing nothing; the risk(s) and benefit(s) of the alternative treatment(s) or procedure(s); and the risk(s) and benefit(s) of doing nothing. The patient was provided information about the general risks and possible complications associated with the procedure. These may include, but are not limited to: failure to achieve desired goals, infection, bleeding, organ or nerve damage, allergic reactions, paralysis, and death. In addition, the patient was informed of those risks and complications associated to Spine-related procedures, such as failure to decrease pain; infection (i.e.: Meningitis, epidural or intraspinal abscess); bleeding (i.e.: epidural hematoma, subarachnoid hemorrhage, or any other type of intraspinal or peri-dural bleeding); organ or nerve damage (i.e.: Any type of peripheral nerve, nerve root, or spinal cord injury) with subsequent damage to sensory, motor, and/or autonomic systems, resulting in permanent pain, numbness, and/or weakness of one  or several areas of the body; allergic reactions; (i.e.: anaphylactic reaction); and/or death. Furthermore, the patient was informed of those risks and complications associated with the medications. These include, but are not limited to: allergic  reactions (i.e.: anaphylactic or anaphylactoid reaction(s)); adrenal axis suppression; blood sugar elevation that in diabetics may result in ketoacidosis or comma; water retention that in patients with history of congestive heart failure may result in shortness of breath, pulmonary edema, and decompensation with resultant heart failure; weight gain; swelling or edema; medication-induced neural toxicity; particulate matter embolism and blood vessel occlusion with resultant organ, and/or nervous system infarction; and/or aseptic necrosis of one or more joints. Finally, the patient was informed that Medicine is not an exact science; therefore, there is also the possibility of unforeseen or unpredictable risks and/or possible complications that may result in a catastrophic outcome. The patient indicated having understood very clearly. We have given the patient no guarantees and we have made no promises. Enough time was given to the patient to ask questions, all of which were answered to the patient's satisfaction. Vanessa Fisher has indicated that she wanted to continue with the procedure. Attestation: I, the ordering provider, attest that I have discussed with the patient the benefits, risks, side-effects, alternatives, likelihood of achieving goals, and potential problems during recovery for the procedure that I have provided informed consent. Date: 11/03/2016; Time: 6:57 AM  Pre-Procedure Preparation:  Monitoring: As per clinic protocol. Respiration, ETCO2, SpO2, BP, heart rate and rhythm monitor placed and checked for adequate function Safety Precautions: Patient was assessed for positional comfort and pressure points before starting the procedure. Time-out: I  initiated and conducted the "Time-out" before starting the procedure, as per protocol. The patient was asked to participate by confirming the accuracy of the "Time Out" information. Verification of the correct person, site, and procedure were performed and confirmed by me, the nursing staff, and the patient. "Time-out" conducted as per Joint Commission's Universal Protocol (UP.01.01.01). "Time-out" Date & Time: 11/03/2016;   hrs.  Description of Procedure Process:   Position: Prone Target Area: For Lumbar Facet blocks, the target is the groove formed by the junction of the transverse process and superior articular process. For the L5 dorsal ramus, the target is the notch between superior articular process and sacral ala. For the S1 dorsal ramus, the target is the superior and lateral edge of the posterior S1 Sacral foramen. Approach: Paramedial approach. Area Prepped: Entire Posterior Lumbosacral Region Prepping solution: ChloraPrep (2% chlorhexidine gluconate and 70% isopropyl alcohol) Safety Precautions: Aspiration looking for blood return was conducted prior to all injections. At no point did we inject any substances, as a needle was being advanced. No attempts were made at seeking any paresthesias. Safe injection practices and needle disposal techniques used. Medications properly checked for expiration dates. SDV (single dose vial) medications used. Description of the Procedure: Protocol guidelines were followed. The patient was placed in position over the fluoroscopy table. The target area was identified and the area prepped in the usual manner. Skin desensitized using vapocoolant spray. Skin & deeper tissues infiltrated with local anesthetic. Appropriate amount of time allowed to pass for local anesthetics to take effect. The procedure needle was introduced through the skin, ipsilateral to the reported pain, and advanced to the target area. Employing the "Medial Branch Technique", the needles were  advanced to the angle made by the superior and medial portion of the transverse process, and the lateral and inferior portion of the superior articulating process of the targeted vertebral bodies. This area is known as "Burton's Eye" or the "Eye of the [place] Dog". A procedure needle was introduced through the skin, and this time advanced to the angle made by the superior and medial  border of the sacral ala, and the lateral border of the S1 vertebral body. This last needle was later repositioned at the superior and lateral border of the posterior S1 foramen. Negative aspiration confirmed. Solution injected in intermittent fashion, asking for systemic symptoms every 0.5cc of injectate. The needles were then removed and the area cleansed, making sure to leave some of the prepping solution back to take advantage of its long term bactericidal properties.   Illustration of the posterior view of the lumbar spine and the posterior neural structures. Laminae of L2 through S1 are labeled. DPRL5, dorsal primary ramus of L5; DPRS1, dorsal primary ramus of S1; DPR3, dorsal primary ramus of L3; FJ, facet (zygapophyseal) joint L3-L4; I, inferior articular process of L4; LB1, lateral branch of dorsal primary ramus of L1; IAB, inferior articular branches from L3 medial branch (supplies L4-L5 facet joint); IBP, intermediate branch plexus; MB3, medial branch of dorsal primary ramus of L3; NR3, third lumbar nerve root; S, superior articular process of L5; SAB, superior articular branches from L4 (supplies L4-5 facet joint also); TP3, transverse process of L3.  There were no vitals filed for this visit.  Start Time:   hrs. End Time:   hrs. Materials:  Needle(s) Type: Regular needle Gauge: 22G Length: 3.5-in Medication(s): Ms. Annett had no medications administered during this visit. Please see chart orders for dosing details.  Imaging Guidance (Spinal):  Type of Imaging Technique: Fluoroscopy Guidance  (Spinal) Indication(s): Assistance in needle guidance and placement for procedures requiring needle placement in or near specific anatomical locations not easily accessible without such assistance. Exposure Time: Please see nurses notes. Contrast: None used. Fluoroscopic Guidance: I was personally present during the use of fluoroscopy. "Tunnel Vision Technique" used to obtain the best possible view of the target area. Parallax error corrected before commencing the procedure. "Direction-depth-direction" technique used to introduce the needle under continuous pulsed fluoroscopy. Once target was reached, antero-posterior, oblique, and lateral fluoroscopic projection used confirm needle placement in all planes. Images permanently stored in EMR. Interpretation: No contrast injected. I personally interpreted the imaging intraoperatively. Adequate needle placement confirmed in multiple planes. Permanent images saved into the patient's record.  Antibiotic Prophylaxis:  Indication(s): None identified Antibiotic given: None  Post-operative Assessment:  EBL: None Complications: No immediate post-treatment complications observed by team, or reported by patient. Note: The patient tolerated the entire procedure well. A repeat set of vitals were taken after the procedure and the patient was kept under observation following institutional policy, for this type of procedure. Post-procedural neurological assessment was performed, showing return to baseline, prior to discharge. The patient was provided with post-procedure discharge instructions, including a section on how to identify potential problems. Should any problems arise concerning this procedure, the patient was given instructions to immediately contact us, at any time, without hesitation. In any case, we plan to contact the patient by telephone for a follow-up status report regarding this interventional procedure. Comments:  No additional relevant  information.  Plan of Care  Disposition: Discharge home  Discharge Date & Time: 11/03/2016;   hrs.   Physician-requested Follow-up:  No Follow-up on file.  Future Appointments Date Time Provider Earl  11/03/2016 8:00 AM Milinda Pointer, MD [facility] None  12/15/2016 9:30 AM Vevelyn Francois, NP [facility] None   Imaging Orders  No imaging studies ordered today   Procedure Orders    No procedure(s) ordered today    Medications ordered for procedure: No orders of the defined types were placed in this encounter.  Medications administered: Ms. Kushner had no medications administered during this visit.  See the medical record for exact dosing, route, and time of administration.  New Prescriptions   No medications on file   Primary Care Physician: Lorelee Market, MD Location: [facility] Note by: Gaspar Cola, MD Date: 11/03/2016; Time: 6:57 AM  Disclaimer:  Medicine is not an exact science. The only guarantee in medicine is that nothing is guaranteed. It is important to note that the decision to proceed with this intervention was based on the information collected from the patient. The Data and conclusions were drawn from the patient's questionnaire, the interview, and the physical examination. Because the information was provided in large part by the patient, it cannot be guaranteed that it has not been purposely or unconsciously manipulated. Every effort has been made to obtain as much relevant data as possible for this evaluation. It is important to note that the conclusions that lead to this procedure are derived in large part from the available data. Always take into account that the treatment will also be dependent on availability of resources and existing treatment guidelines, considered by other Pain Management Practitioners as being common knowledge and practice, at the time of the intervention. For Medico-Legal purposes,  it is also important to point out that variation in procedural techniques and pharmacological choices are the acceptable norm. The indications, contraindications, technique, and results of the above procedure should only be interpreted and judged by a Board-Certified Interventional Pain Specialist with extensive familiarity and expertise in the same exact procedure and technique.

## 2016-11-05 ENCOUNTER — Ambulatory Visit: Payer: Medicare Other | Attending: Nurse Practitioner | Admitting: Nurse Practitioner

## 2016-11-05 ENCOUNTER — Encounter: Payer: Self-pay | Admitting: Nurse Practitioner

## 2016-11-05 VITALS — BP 121/70 | HR 88 | Temp 98.1°F | Resp 18 | Ht 67.0 in | Wt 240.0 lb

## 2016-11-05 DIAGNOSIS — F329 Major depressive disorder, single episode, unspecified: Secondary | ICD-10-CM | POA: Diagnosis not present

## 2016-11-05 DIAGNOSIS — Z880 Allergy status to penicillin: Secondary | ICD-10-CM | POA: Insufficient documentation

## 2016-11-05 DIAGNOSIS — M5116 Intervertebral disc disorders with radiculopathy, lumbar region: Secondary | ICD-10-CM | POA: Diagnosis not present

## 2016-11-05 DIAGNOSIS — M5010 Cervical disc disorder with radiculopathy, unspecified cervical region: Secondary | ICD-10-CM | POA: Insufficient documentation

## 2016-11-05 DIAGNOSIS — G894 Chronic pain syndrome: Secondary | ICD-10-CM | POA: Insufficient documentation

## 2016-11-05 DIAGNOSIS — K219 Gastro-esophageal reflux disease without esophagitis: Secondary | ICD-10-CM | POA: Diagnosis not present

## 2016-11-05 DIAGNOSIS — G8929 Other chronic pain: Secondary | ICD-10-CM | POA: Diagnosis not present

## 2016-11-05 DIAGNOSIS — M545 Low back pain, unspecified: Secondary | ICD-10-CM

## 2016-11-05 DIAGNOSIS — E559 Vitamin D deficiency, unspecified: Secondary | ICD-10-CM | POA: Insufficient documentation

## 2016-11-05 DIAGNOSIS — E119 Type 2 diabetes mellitus without complications: Secondary | ICD-10-CM | POA: Insufficient documentation

## 2016-11-05 DIAGNOSIS — Z79899 Other long term (current) drug therapy: Secondary | ICD-10-CM | POA: Insufficient documentation

## 2016-11-05 DIAGNOSIS — Z888 Allergy status to other drugs, medicaments and biological substances status: Secondary | ICD-10-CM | POA: Diagnosis not present

## 2016-11-05 DIAGNOSIS — M4802 Spinal stenosis, cervical region: Secondary | ICD-10-CM | POA: Insufficient documentation

## 2016-11-05 DIAGNOSIS — Z90710 Acquired absence of both cervix and uterus: Secondary | ICD-10-CM | POA: Diagnosis not present

## 2016-11-05 DIAGNOSIS — M79604 Pain in right leg: Secondary | ICD-10-CM

## 2016-11-05 DIAGNOSIS — Z886 Allergy status to analgesic agent status: Secondary | ICD-10-CM | POA: Diagnosis not present

## 2016-11-05 DIAGNOSIS — J45909 Unspecified asthma, uncomplicated: Secondary | ICD-10-CM | POA: Insufficient documentation

## 2016-11-05 DIAGNOSIS — M7918 Myalgia, other site: Secondary | ICD-10-CM | POA: Insufficient documentation

## 2016-11-05 DIAGNOSIS — M48061 Spinal stenosis, lumbar region without neurogenic claudication: Secondary | ICD-10-CM | POA: Insufficient documentation

## 2016-11-05 DIAGNOSIS — Z7982 Long term (current) use of aspirin: Secondary | ICD-10-CM | POA: Insufficient documentation

## 2016-11-05 DIAGNOSIS — Z885 Allergy status to narcotic agent status: Secondary | ICD-10-CM | POA: Insufficient documentation

## 2016-11-05 DIAGNOSIS — M170 Bilateral primary osteoarthritis of knee: Secondary | ICD-10-CM | POA: Diagnosis not present

## 2016-11-05 MED ORDER — KETOROLAC TROMETHAMINE 60 MG/2ML IM SOLN
INTRAMUSCULAR | Status: AC
  Filled 2016-11-05: qty 2

## 2016-11-05 MED ORDER — KETOROLAC TROMETHAMINE 60 MG/2ML IM SOLN
60.0000 mg | Freq: Once | INTRAMUSCULAR | Status: AC
  Administered 2016-11-05: 60 mg via INTRAMUSCULAR

## 2016-11-05 MED ORDER — MELOXICAM 15 MG PO TABS: 15.0000 mg | ORAL_TABLET | Freq: Every day | ORAL | 2 refills | Status: DC

## 2016-11-05 MED ORDER — ORPHENADRINE CITRATE 30 MG/ML IJ SOLN
60.0000 mg | Freq: Once | INTRAMUSCULAR | Status: AC
  Administered 2016-11-05: 60 mg via INTRAMUSCULAR

## 2016-11-05 MED ORDER — TRAMADOL HCL 50 MG PO TABS: 100.0000 mg | ORAL_TABLET | Freq: Four times a day (QID) | ORAL | 0 refills | Status: DC | PRN

## 2016-11-05 MED ORDER — ORPHENADRINE CITRATE 30 MG/ML IJ SOLN
INTRAMUSCULAR | Status: AC
  Filled 2016-11-05: qty 2

## 2016-11-05 NOTE — Progress Notes (Signed)
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 and leave a message stating your name, the date and nature of the emergency, and the name  and dose of the medication prescribed. In the event that your call is answered by a member of our staff, make sure to document and save the date, time, and the name of the person that took your information.  2. Exception #2 (Planned Surgery): In the event that you are scheduled by another doctor or dentist to have any type of surgery or procedure, you are allowed (for a period no longer than 30 days), to receive additional pain medication, for the acute post-op pain. However, in this case, you are responsible for picking up a copy of our "Post-op Pain Management for Surgeons" handout, and giving it to your surgeon or  dentist. This document is available at our office, and does not require an appointment to obtain it. Simply go to our office during business hours (Monday-Thursday from 8:00 AM to 4:00 PM) (Friday 8:00 AM to 12:00 Noon) or if you have a scheduled appointment with [place], prior to your surgery, and ask for it by name. In addition, you will need to provide [place] with your name, name of your surgeon, type of surgery, and date of procedure or surgery.  *Opioid medications include: morphine, codeine, oxycodone, oxymorphone, hydrocodone, hydromorphone, meperidine, tramadol, tapentadol, buprenorphine, fentanyl, methadone. **Benzodiazepine medications include: diazepam (Valium), alprazolam (Xanax), clonazepam (Klonopine), lorazepam (Ativan), clorazepate (Tranxene), chlordiazepoxide (Librium), estazolam (Prosom), oxazepam (Serax), temazepam (Restoril), triazolam (Halcion)  ____________________________________________________________________________________________

## 2016-11-05 NOTE — Patient Instructions (Signed)

## 2016-11-05 NOTE — Progress Notes (Signed)
Nursing Pain Medication Assessment:  Safety precautions to be maintained throughout the outpatient stay will include: orient to surroundings, keep bed in low position, maintain call bell within reach at all times, provide assistance with transfer out of bed and ambulation.  Medication Inspection Compliance: Ms. Giannetti did not comply with our request to bring her pills to be counted. She was reminded that bringing the medication bottles, even when empty, is a requirement.  Medication: None brought in. Pill/Patch Count: None available to be counted. Bottle Appearance: No container available. Did not bring bottle(s) to appointment. Filled Date: N/A Last Medication intake:  Yesterday

## 2016-11-10 ENCOUNTER — Encounter: Payer: Self-pay | Admitting: Pain Medicine

## 2016-11-10 ENCOUNTER — Ambulatory Visit
Admission: RE | Admit: 2016-11-10 | Discharge: 2016-11-10 | Disposition: A | Discharge status: Home / Self Care | Payer: Medicare Other | Source: Ambulatory Visit | Attending: Pain Medicine | Admitting: Pain Medicine

## 2016-11-10 ENCOUNTER — Ambulatory Visit (HOSPITAL_BASED_OUTPATIENT_CLINIC_OR_DEPARTMENT_OTHER): Payer: Medicare Other | Admitting: Pain Medicine

## 2016-11-10 VITALS — BP 118/64 | HR 73 | Temp 97.1°F | Resp 18 | Ht 67.0 in | Wt 240.0 lb

## 2016-11-10 DIAGNOSIS — G8929 Other chronic pain: Secondary | ICD-10-CM | POA: Diagnosis present

## 2016-11-10 DIAGNOSIS — M545 Low back pain, unspecified: Secondary | ICD-10-CM

## 2016-11-10 DIAGNOSIS — M47816 Spondylosis without myelopathy or radiculopathy, lumbar region: Secondary | ICD-10-CM | POA: Diagnosis present

## 2016-11-10 DIAGNOSIS — M5136 Other intervertebral disc degeneration, lumbar region: Secondary | ICD-10-CM | POA: Diagnosis not present

## 2016-11-10 DIAGNOSIS — M4310 Spondylolisthesis, site unspecified: Secondary | ICD-10-CM

## 2016-11-10 MED ORDER — FENTANYL CITRATE (PF) 100 MCG/2ML IJ SOLN
25.0000 ug | INTRAMUSCULAR | Status: DC | PRN
  Administered 2016-11-10: 100 ug via INTRAVENOUS
  Filled 2016-11-10: qty 2

## 2016-11-10 MED ORDER — ROPIVACAINE HCL 2 MG/ML IJ SOLN
9.0000 mL | Freq: Once | INTRAMUSCULAR | Status: AC
  Administered 2016-11-10: 9 mL via PERINEURAL
  Filled 2016-11-10: qty 10

## 2016-11-10 MED ORDER — LACTATED RINGERS IV SOLN
1000.0000 mL | Freq: Once | INTRAVENOUS | Status: AC
  Administered 2016-11-10: 1000 mL via INTRAVENOUS

## 2016-11-10 MED ORDER — LIDOCAINE HCL 2 % IJ SOLN
10.0000 mL | Freq: Once | INTRAMUSCULAR | Status: AC
  Administered 2016-11-10: 400 mg
  Filled 2016-11-10: qty 20

## 2016-11-10 MED ORDER — TRIAMCINOLONE ACETONIDE 40 MG/ML IJ SUSP
40.0000 mg | Freq: Once | INTRAMUSCULAR | Status: AC
  Administered 2016-11-10: 40 mg
  Filled 2016-11-10: qty 1

## 2016-11-10 MED ORDER — MIDAZOLAM HCL 5 MG/5ML IJ SOLN
1.0000 mg | INTRAMUSCULAR | Status: DC | PRN
  Administered 2016-11-10: 2 mg via INTRAVENOUS
  Filled 2016-11-10: qty 5

## 2016-11-10 NOTE — Progress Notes (Signed)
Patient's Name: Vanessa Fisher  MRN: [id number]  Referring Provider: Milinda Pointer, MD  DOB: [birth year]  PCP: Lorelee Market, MD  DOS: 11/10/2016  Note by: Gaspar Cola, MD  Service setting: Ambulatory outpatient  Specialty: Interventional Pain Management  Patient type: Established  Location: [facility] (AMB) Pain Management Facility  Visit type: Interventional Procedure   Primary Reason for Visit: Interventional Pain Management Treatment. CC: Back Pain (lower)  Procedure:  Anesthesia, Analgesia, Anxiolysis:  Type: Diagnostic Medial Branch Facet Block Region: Lumbar Level: L2, L3, L4, L5, & S1 Medial Branch Level(s) Laterality: Bilateral  Type: Local Anesthesia with Moderate (Conscious) Sedation Local Anesthetic: Lidocaine 1% Route: Intravenous (IV) IV Access: Secured Sedation: Meaningful verbal contact was maintained at all times during the procedure  Indication(s): Analgesia and Anxiety   Indications: 1. Lumbar facet syndrome (Bilateral) (L>R)   2. DDD (degenerative disc disease), lumbar   3. Chronic low back pain (Primary Source of Pain) (midline) ( (Bilateral) (L>R)   4. Lumbar facet osteoarthritis   5. Grade 1 Anterolisthesis of L4 over L5 (5 mm)    Pain Score: Pre-procedure: 7 /10 Post-procedure: 0-No pain/10  Pre-op Assessment:  Vanessa Fisher is a 56 y.o. (year old), female patient, seen today for interventional treatment. She  has a past surgical history that includes Partial hysterectomy and Tubal ligation. Vanessa Fisher has a current medication list which includes the following prescription(s): albuterol, aspirin, cetirizine, fluticasone, glimepiride, lisinopril, meloxicam, pantoprazole, and tramadol, and the following Facility-Administered Medications: fentanyl and midazolam. Her primarily concern today is the Back Pain (lower)  Initial Vital Signs: There were no vitals taken for this visit. BMI: Estimated body mass index is 37.59 kg/m as calculated  from the following:   Height as of this encounter: 5\' 7"  (1.702 m).   Weight as of this encounter: 240 lb (108.9 kg).  Risk Assessment: Allergies: Reviewed. She is allergic to bc powder  [aspirin-salicylamide-caffeine]; hydrocodone-acetaminophen; penicillin g; and penicillins.  Allergy Precautions: None required Coagulopathies: Reviewed. None identified.  Blood-thinner therapy: None at this time Active Infection(s): Reviewed. None identified. Vanessa Fisher is afebrile  Site Confirmation: Vanessa Fisher was asked to confirm the procedure and laterality before marking the site Procedure checklist: Completed Consent: Before the procedure and under the influence of no sedative(s), amnesic(s), or anxiolytics, the patient was informed of the treatment options, risks and possible complications. To fulfill our ethical and legal obligations, as recommended by the American Medical Association's Code of Ethics, I have informed the patient of my clinical impression; the nature and purpose of the treatment or procedure; the risks, benefits, and possible complications of the intervention; the alternatives, including doing nothing; the risk(s) and benefit(s) of the alternative treatment(s) or procedure(s); and the risk(s) and benefit(s) of doing nothing. The patient was provided information about the general risks and possible complications associated with the procedure. These may include, but are not limited to: failure to achieve desired goals, infection, bleeding, organ or nerve damage, allergic reactions, paralysis, and death. In addition, the patient was informed of those risks and complications associated to Spine-related procedures, such as failure to decrease pain; infection (i.e.: Meningitis, epidural or intraspinal abscess); bleeding (i.e.: epidural hematoma, subarachnoid hemorrhage, or any other type of intraspinal or peri-dural bleeding); organ or nerve damage (i.e.: Any type of peripheral nerve,  nerve root, or spinal cord injury) with subsequent damage to sensory, motor, and/or autonomic systems, resulting in permanent pain, numbness, and/or weakness of one or several areas of the body; allergic reactions; (i.e.: anaphylactic reaction);  and/or death. Furthermore, the patient was informed of those risks and complications associated with the medications. These include, but are not limited to: allergic reactions (i.e.: anaphylactic or anaphylactoid reaction(s)); adrenal axis suppression; blood sugar elevation that in diabetics may result in ketoacidosis or comma; water retention that in patients with history of congestive heart failure may result in shortness of breath, pulmonary edema, and decompensation with resultant heart failure; weight gain; swelling or edema; medication-induced neural toxicity; particulate matter embolism and blood vessel occlusion with resultant organ, and/or nervous system infarction; and/or aseptic necrosis of one or more joints. Finally, the patient was informed that Medicine is not an exact science; therefore, there is also the possibility of unforeseen or unpredictable risks and/or possible complications that may result in a catastrophic outcome. The patient indicated having understood very clearly. We have given the patient no guarantees and we have made no promises. Enough time was given to the patient to ask questions, all of which were answered to the patient's satisfaction. Vanessa Fisher has indicated that she wanted to continue with the procedure. Attestation: I, the ordering provider, attest that I have discussed with the patient the benefits, risks, side-effects, alternatives, likelihood of achieving goals, and potential problems during recovery for the procedure that I have provided informed consent. Date: 11/10/2016; Time: 7:20 AM  Pre-Procedure Preparation:  Monitoring: As per clinic protocol. Respiration, ETCO2, SpO2, BP, heart rate and rhythm monitor placed  and checked for adequate function Safety Precautions: Patient was assessed for positional comfort and pressure points before starting the procedure. Time-out: I initiated and conducted the "Time-out" before starting the procedure, as per protocol. The patient was asked to participate by confirming the accuracy of the "Time Out" information. Verification of the correct person, site, and procedure were performed and confirmed by me, the nursing staff, and the patient. "Time-out" conducted as per Joint Commission's Universal Protocol (UP.01.01.01). "Time-out" Date & Time: 11/10/2016; 1100 hrs.  Description of Procedure Process:   Position: Prone Target Area: For Lumbar Facet blocks, the target is the groove formed by the junction of the transverse process and superior articular process. For the L5 dorsal ramus, the target is the notch between superior articular process and sacral ala. For the S1 dorsal ramus, the target is the superior and lateral edge of the posterior S1 Sacral foramen. Approach: Paramedial approach. Area Prepped: Entire Posterior Lumbosacral Region Prepping solution: ChloraPrep (2% chlorhexidine gluconate and 70% isopropyl alcohol) Safety Precautions: Aspiration looking for blood return was conducted prior to all injections. At no point did we inject any substances, as a needle was being advanced. No attempts were made at seeking any paresthesias. Safe injection practices and needle disposal techniques used. Medications properly checked for expiration dates. SDV (single dose vial) medications used. Description of the Procedure: Protocol guidelines were followed. The patient was placed in position over the fluoroscopy table. The target area was identified and the area prepped in the usual manner. Skin desensitized using vapocoolant spray. Skin & deeper tissues infiltrated with local anesthetic. Appropriate amount of time allowed to pass for local anesthetics to take effect. The procedure  needle was introduced through the skin, ipsilateral to the reported pain, and advanced to the target area. Employing the "Medial Branch Technique", the needles were advanced to the angle made by the superior and medial portion of the transverse process, and the lateral and inferior portion of the superior articulating process of the targeted vertebral bodies. This area is known as "Burton's Eye" or the "Eye of the  [place] Dog". A procedure needle was introduced through the skin, and this time advanced to the angle made by the superior and medial border of the sacral ala, and the lateral border of the S1 vertebral body. This last needle was later repositioned at the superior and lateral border of the posterior S1 foramen. Negative aspiration confirmed. Solution injected in intermittent fashion, asking for systemic symptoms every 0.5cc of injectate. The needles were then removed and the area cleansed, making sure to leave some of the prepping solution back to take advantage of its long term bactericidal properties.   Illustration of the posterior view of the lumbar spine and the posterior neural structures. Laminae of L2 through S1 are labeled. DPRL5, dorsal primary ramus of L5; DPRS1, dorsal primary ramus of S1; DPR3, dorsal primary ramus of L3; FJ, facet (zygapophyseal) joint L3-L4; I, inferior articular process of L4; LB1, lateral branch of dorsal primary ramus of L1; IAB, inferior articular branches from L3 medial branch (supplies L4-L5 facet joint); IBP, intermediate branch plexus; MB3, medial branch of dorsal primary ramus of L3; NR3, third lumbar nerve root; S, superior articular process of L5; SAB, superior articular branches from L4 (supplies L4-5 facet joint also); TP3, transverse process of L3.  Vitals:   11/10/16 1112 11/10/16 1122 11/10/16 1132 11/10/16 1142  BP: 109/63 (!) 116/44 (!) 117/56 118/64  Pulse:      Resp: 17 14 14 18   Temp:  (!) 97.1 F (36.2 C)    TempSrc:      SpO2: 97% 99% 99%  100%  Weight:      Height:        Start Time: 1100 hrs. End Time: 1111 hrs. Materials:  Needle(s) Type: Regular needle Gauge: 22G Length: 3.5-in Medication(s): We administered lactated ringers, midazolam, fentaNYL, lidocaine, triamcinolone acetonide, ropivacaine (PF) 2 mg/mL (0.2%), triamcinolone acetonide, and ropivacaine (PF) 2 mg/mL (0.2%). Please see chart orders for dosing details.  Imaging Guidance (Spinal):  Type of Imaging Technique: Fluoroscopy Guidance (Spinal) Indication(s): Assistance in needle guidance and placement for procedures requiring needle placement in or near specific anatomical locations not easily accessible without such assistance. Exposure Time: Please see nurses notes. Contrast: None used. Fluoroscopic Guidance: I was personally present during the use of fluoroscopy. "Tunnel Vision Technique" used to obtain the best possible view of the target area. Parallax error corrected before commencing the procedure. "Direction-depth-direction" technique used to introduce the needle under continuous pulsed fluoroscopy. Once target was reached, antero-posterior, oblique, and lateral fluoroscopic projection used confirm needle placement in all planes. Images permanently stored in EMR. Interpretation: No contrast injected. I personally interpreted the imaging intraoperatively. Adequate needle placement confirmed in multiple planes. Permanent images saved into the patient's record.  Antibiotic Prophylaxis:  Indication(s): None identified Antibiotic given: None  Post-operative Assessment:  EBL: None Complications: No immediate post-treatment complications observed by team, or reported by patient. Note: The patient tolerated the entire procedure well. A repeat set of vitals were taken after the procedure and the patient was kept under observation following institutional policy, for this type of procedure. Post-procedural neurological assessment was performed, showing return to  baseline, prior to discharge. The patient was provided with post-procedure discharge instructions, including a section on how to identify potential problems. Should any problems arise concerning this procedure, the patient was given instructions to immediately contact us, at any time, without hesitation. In any case, we plan to contact the patient by telephone for a follow-up status report regarding this interventional procedure. Comments:  No additional relevant  information.  Plan of Care    Imaging Orders     DG C-Arm 1-60 Min-No Report Procedure Orders    No procedure(s) ordered today    Medications ordered for procedure: Meds ordered this encounter  Medications  . lactated ringers infusion 1,000 mL  . midazolam (VERSED) 5 MG/5ML injection 1-2 mg    Make sure Flumazenil is available in the pyxis when using this medication. If oversedation occurs, administer 0.2 mg IV over 15 sec. If after 45 sec no response, administer 0.2 mg again over 1 min; may repeat at 1 min intervals; not to exceed 4 doses (1 mg)  . fentaNYL (SUBLIMAZE) injection 25-50 mcg    Make sure Narcan is available in the pyxis when using this medication. In the event of respiratory depression (RR< 8/min): Titrate NARCAN (naloxone) in increments of 0.1 to 0.2 mg IV at 2-3 minute intervals, until desired degree of reversal.  . lidocaine (XYLOCAINE) 2 % (with pres) injection 200 mg  . triamcinolone acetonide (KENALOG-40) injection 40 mg  . ropivacaine (PF) 2 mg/mL (0.2%) (NAROPIN) injection 9 mL  . triamcinolone acetonide (KENALOG-40) injection 40 mg  . ropivacaine (PF) 2 mg/mL (0.2%) (NAROPIN) injection 9 mL   Medications administered: We administered lactated ringers, midazolam, fentaNYL, lidocaine, triamcinolone acetonide, ropivacaine (PF) 2 mg/mL (0.2%), triamcinolone acetonide, and ropivacaine (PF) 2 mg/mL (0.2%).  See the medical record for exact dosing, route, and time of administration.  New Prescriptions   No  medications on file   Disposition: Discharge home  Discharge Date & Time: 11/10/2016; 1143 hrs.   Physician-requested Follow-up: Return for post-procedure eval by Dr. Dossie Arbour in 2 wks. Future Appointments Date Time Provider [place]  11/25/2016 2:00 PM Milinda Pointer, MD [facility] None  12/15/2016 9:30 AM Vevelyn Francois, NP [facility] None   Primary Care Physician: Lorelee Market, MD Location: [facility] Note by: Gaspar Cola, MD Date: 11/10/2016; Time: 12:53 PM  Disclaimer:  Medicine is not an exact science. The only guarantee in medicine is that nothing is guaranteed. It is important to note that the decision to proceed with this intervention was based on the information collected from the patient. The Data and conclusions were drawn from the patient's questionnaire, the interview, and the physical examination. Because the information was provided in large part by the patient, it cannot be guaranteed that it has not been purposely or unconsciously manipulated. Every effort has been made to obtain as much relevant data as possible for this evaluation. It is important to note that the conclusions that lead to this procedure are derived in large part from the available data. Always take into account that the treatment will also be dependent on availability of resources and existing treatment guidelines, considered by other Pain Management Practitioners as being common knowledge and practice, at the time of the intervention. For Medico-Legal purposes, it is also important to point out that variation in procedural techniques and pharmacological choices are the acceptable norm. The indications, contraindications, technique, and results of the above procedure should only be interpreted and judged by a Board-Certified Interventional Pain Specialist with extensive familiarity and expertise in the same exact procedure and technique.

## 2016-11-10 NOTE — Patient Instructions (Addendum)
____________________________________________________________________________________________  Post-Procedure instructions Instructions:  Apply ice: Fill a plastic sandwich bag with crushed ice. Cover it with a small towel and apply to injection site. Apply for 15 minutes then remove x 15 minutes. Repeat sequence on day of procedure, until you go to bed. The purpose is to minimize swelling and discomfort after procedure.  Apply heat: Apply heat to procedure site starting the day following the procedure. The purpose is to treat any soreness and discomfort from the procedure.  Food intake: Start with clear liquids (like water) and advance to regular food, as tolerated.   Physical activities: Keep activities to a minimum for the first 8 hours after the procedure.   Driving: If you have received any sedation, you are not allowed to drive for 24 hours after your procedure.  Blood thinner: Restart your blood thinner 6 hours after your procedure. (Only for those taking blood thinners)  Insulin: As soon as you can eat, you may resume your normal dosing schedule. (Only for those taking insulin)  Infection prevention: Keep procedure site clean and dry.  Post-procedure Pain Diary: Extremely important that this be done correctly and accurately. Recorded information will be used to determine the next step in treatment.  Pain evaluated is that of treated area only. Do not include pain from an untreated area.  Complete every hour, on the hour, for the initial 8 hours. Set an alarm to help you do this part accurately.  Do not go to sleep and have it completed later. It will not be accurate.  Follow-up appointment: Keep your follow-up appointment after the procedure. Usually 2 weeks for most procedures. (6 weeks in the case of radiofrequency.) Bring you pain diary.  Expect:  From numbing medicine (AKA: Local Anesthetics): Numbness or decrease in pain.  Onset: Full effect within 15 minutes of  injected.  Duration: It will depend on the type of local anesthetic used. On the average, 1 to 8 hours.   From steroids: Decrease in swelling or inflammation. Once inflammation is improved, relief of the pain will follow.  Onset of benefits: Depends on the amount of swelling present. The more swelling, the longer it will take for the benefits to be seen. In some cases, up to 10 days.  Duration: Steroids will stay in the system x 2 weeks. Duration of benefits will depend on multiple posibilities including persistent irritating factors.  From procedure: Some discomfort is to be expected once the numbing medicine wears off. This should be minimal if ice and heat are applied as instructed. Call if:  You experience numbness and weakness that gets worse with time, as opposed to wearing off.  New onset bowel or bladder incontinence. (Spinal procedures only)  Emergency Numbers:  Durning business hours (Monday - Thursday, 8:00 AM - 4:00 PM) (Friday, 9:00 AM - 12:00 Noon): [phone]  After hours: [phone] ____________________________________________________________________________________________  Pain Management Discharge Instructions  General Discharge Instructions :  If you need to reach your doctor call: Monday-Friday 8:00 am - 4:00 pm at [phone] or toll free [phone].  After clinic hours [phone] to have operator reach doctor.  Bring all of your medication bottles to all your appointments in the pain clinic.  To cancel or reschedule your appointment with Pain Management please remember to call 24 hours in advance to avoid a fee.  Refer to the educational materials which you have been given on: General Risks, I had my Procedure. Discharge Instructions, Post Sedation.  Post Procedure Instructions:  The drugs you   were given will stay in your system until tomorrow, so for the next 24 hours you should not drive, make any legal decisions or drink any alcoholic  beverages.  You may eat anything you prefer, but it is better to start with liquids then soups and crackers, and gradually work up to solid foods.  Please notify your doctor immediately if you have any unusual bleeding, trouble breathing or pain that is not related to your normal pain.  Depending on the type of procedure that was done, some parts of your body may feel week and/or numb.  This usually clears up by tonight or the next day.  Walk with the use of an assistive device or accompanied by an adult for the 24 hours.  You may use ice on the affected area for the first 24 hours.  Put ice in a Ziploc bag and cover with a towel and place against area 15 minutes on 15 minutes off.  You may switch to heat after 24 hours. Facet Joint Block The facet joints connect the bones of the spine (vertebrae). They make it possible for you to bend, twist, and make other movements with your spine. They also keep you from bending too far, twisting too far, and making other excessive movements. A facet joint block is a procedure where a numbing medicine (anesthetic) is injected into a facet joint. Often, a type of anti-inflammatory medicine called a steroid is also injected. A facet joint block may be done to diagnose neck or back pain. If the pain gets better after a facet joint block, it means the pain is probably coming from the facet joint. If the pain does not get better, it means the pain is probably not coming from the facet joint. A facet joint block may also be done to relieve neck or back pain caused by an inflamed facet joint. A facet joint block is only done to relieve pain if the pain does not improve with other methods, such as medicine, exercise programs, and physical therapy. Tell a health care provider about:  Any allergies you have.  All medicines you are taking, including vitamins, herbs, eye drops, creams, and over-the-counter medicines.  Any problems you or family members have had with  anesthetic medicines.  Any blood disorders you have.  Any surgeries you have had.  Any medical conditions you have.  Whether you are pregnant or may be pregnant. What are the risks? Generally, this is a safe procedure. However, problems may occur, including:  Bleeding.  Injury to a nerve near the injection site.  Pain at the injection site.  Weakness or numbness in areas controlled by nerves near the injection site.  Infection.  Temporary fluid retention.  Allergic reactions to medicines or dyes.  Injury to other structures or organs near the injection site.  What happens before the procedure?  Follow instructions from your health care provider about eating or drinking restrictions.  Ask your health care provider about: ? Changing or stopping your regular medicines. This is especially important if you are taking diabetes medicines or blood thinners. ? Taking medicines such as aspirin and ibuprofen. These medicines can thin your blood. Do not take these medicines before your procedure if your health care provider instructs you not to.  Do not take any new dietary supplements or medicines without asking your health care provider first.  Plan to have someone take you home after the procedure. What happens during the procedure?  You may need to remove your   clothing and dress in an open-back gown.  The procedure will be done while you are lying on an X-ray table. You will most likely be asked to lie on your stomach, but you may be asked to lie in a different position if an injection will be made in your neck.  Machines will be used to monitor your oxygen levels, heart rate, and blood pressure.  If an injection will be made in your neck, an IV tube will be inserted into one of your veins. Fluids and medicine will flow directly into your body through the IV tube.  The area over the facet joint where the injection will be made will be cleaned with soap. The surrounding skin  will be covered with clean drapes.  A numbing medicine (local anesthetic) will be applied to your skin. Your skin may sting or burn for a moment.  A video X-ray machine (fluoroscopy) will be used to locate the joint. In some cases, a CT scan may be used.  A contrast dye may be injected into the facet joint area to help locate the joint.  When the joint is located, an anesthetic will be injected into the joint through the needle.  Your health care provider will ask you whether you feel pain relief. If you do feel relief, a steroid may be injected to provide pain relief for a longer period of time. If you do not feel relief or feel only partial relief, additional injections of an anesthetic may be made in other facet joints.  The needle will be removed.  Your skin will be cleaned.  A bandage (dressing) will be applied over each injection site. The procedure may vary among health care providers and hospitals. What happens after the procedure?  You will be observed for 15-30 minutes before being allowed to go home. This information is not intended to replace advice given to you by your health care provider. Make sure you discuss any questions you have with your health care provider. Document Released: 06/10/2006 Document Revised: 02/20/2015 Document Reviewed: 10/15/2014 Elsevier Interactive Patient Education  2018 [employer]  Facet Joint Block, Care After Refer to this sheet in the next few weeks. These instructions provide you with information about caring for yourself after your procedure. Your health care provider may also give you more specific instructions. Your treatment has been planned according to current medical practices, but problems sometimes occur. Call your health care provider if you have any problems or questions after your procedure. What can I expect after the procedure? After the procedure, it is common to have:  Some tenderness over the injection sites for 2 days  after the procedure.  A temporary increase in blood sugar if you have diabetes.  Follow these instructions at home:  Keep track of the amount of pain relief you feel and how long it lasts.  Take over-the-counter and prescription medicines only as told by your health care provider. You may need to limit pain medicine within the first 4-6 hours after the procedure.  Remove your bandages (dressings) the morning after the procedure.  For the first 24 hours after the procedure: ? Do not apply heat near or over the injection sites. ? Do not take a bath or soak in water, such as in a pool or lake. ? Do not drive or operate heavy machinery unless approved by your health care provider. ? Avoid activities that require a lot of energy.  If the injection site is tender, try applying ice   to the area. To do this: ? Put ice in a plastic bag. ? Place a towel between your skin and the bag. ? Leave the ice on for 20 minutes, 2-3 times a day.  Keep all follow-up visits as told by your health care provider. This is important. Contact a health care provider if:  Fluid is coming from an injection site.  There is significant bleeding or swelling at an injection site.  You have diabetes and your blood sugar is above 180 mg/dL. Get help right away if:  You have a fever.  You have worsening pain or swelling around an injection site.  There are red streaks around an injection site.  You develop severe pain that is not controlled by your medicines.  You develop a headache, stiff neck, nausea, or vomiting.  Your eyes become very sensitive to light.  You have weakness, paralysis, or tingling in your arms or legs that was not present before the procedure.  You have difficulty urinating or breathing. This information is not intended to replace advice given to you by your health care provider. Make sure you discuss any questions you have with your health care provider. Document Released: 01/06/2012  Document Revised: 06/05/2015 Document Reviewed: 10/15/2014 Elsevier Interactive Patient Education  2018 [employer].  

## 2016-11-10 NOTE — Progress Notes (Signed)
Safety precautions to be maintained throughout the outpatient stay will include: orient to surroundings, keep bed in low position, maintain call bell within reach at all times, provide assistance with transfer out of bed and ambulation.  

## 2016-11-11 ENCOUNTER — Telehealth: Payer: Self-pay | Admitting: *Deleted

## 2016-11-11 NOTE — Telephone Encounter (Signed)
Voicemail left with patient to call our office if there are any patients or concerns re; procedure on yesterday.

## 2016-11-25 ENCOUNTER — Ambulatory Visit: Payer: Medicare Other | Attending: Pain Medicine | Admitting: Pain Medicine

## 2016-12-12 ENCOUNTER — Emergency Department: Payer: Medicare Other

## 2016-12-12 ENCOUNTER — Emergency Department
Admission: EM | Admit: 2016-12-12 | Discharge: 2016-12-12 | Disposition: A | Discharge status: Home / Self Care | Payer: Medicare Other | Attending: Emergency Medicine | Admitting: Emergency Medicine

## 2016-12-12 ENCOUNTER — Encounter: Payer: Self-pay | Admitting: Emergency Medicine

## 2016-12-12 ENCOUNTER — Other Ambulatory Visit: Payer: Self-pay

## 2016-12-12 DIAGNOSIS — Z7982 Long term (current) use of aspirin: Secondary | ICD-10-CM | POA: Diagnosis not present

## 2016-12-12 DIAGNOSIS — Z79899 Other long term (current) drug therapy: Secondary | ICD-10-CM | POA: Diagnosis not present

## 2016-12-12 DIAGNOSIS — R0602 Shortness of breath: Secondary | ICD-10-CM | POA: Insufficient documentation

## 2016-12-12 DIAGNOSIS — E119 Type 2 diabetes mellitus without complications: Secondary | ICD-10-CM | POA: Insufficient documentation

## 2016-12-12 DIAGNOSIS — R2240 Localized swelling, mass and lump, unspecified lower limb: Secondary | ICD-10-CM | POA: Diagnosis present

## 2016-12-12 DIAGNOSIS — R600 Localized edema: Secondary | ICD-10-CM | POA: Diagnosis not present

## 2016-12-12 DIAGNOSIS — J45909 Unspecified asthma, uncomplicated: Secondary | ICD-10-CM | POA: Insufficient documentation

## 2016-12-12 DIAGNOSIS — R609 Edema, unspecified: Secondary | ICD-10-CM

## 2016-12-12 LAB — COMPREHENSIVE METABOLIC PANEL
ALBUMIN: 3.3 g/dL — AB (ref 3.5–5.0)
ALT: 22 U/L (ref 14–54)
ANION GAP: 9 (ref 5–15)
AST: 24 U/L (ref 15–41)
Alkaline Phosphatase: 90 U/L (ref 38–126)
BILIRUBIN TOTAL: 0.2 mg/dL — AB (ref 0.3–1.2)
BUN: 14 mg/dL (ref 6–20)
CHLORIDE: 106 mmol/L (ref 101–111)
CO2: 26 mmol/L (ref 22–32)
Calcium: 8.7 mg/dL — ABNORMAL LOW (ref 8.9–10.3)
Creatinine, Ser: 0.88 mg/dL (ref 0.44–1.00)
GFR calc Af Amer: >60 mL/min (ref >60)
Glucose, Bld: 84 mg/dL (ref 65–99)
POTASSIUM: 3.4 mmol/L — AB (ref 3.5–5.1)
Sodium: 141 mmol/L (ref 135–145)
TOTAL PROTEIN: 6.6 g/dL (ref 6.5–8.1)

## 2016-12-12 LAB — CBC
HCT: 34.1 % — ABNORMAL LOW (ref 35.0–47.0)
Hemoglobin: 11 g/dL — ABNORMAL LOW (ref 12.0–16.0)
MCH: 25.9 pg — AB (ref 26.0–34.0)
MCHC: 32.3 g/dL (ref 32.0–36.0)
MCV: 80.4 fL (ref 80.0–100.0)
PLATELETS: 245 10*3/uL (ref 150–440)
RBC: 4.25 MIL/uL (ref 3.80–5.20)
RDW: 15.4 % — AB (ref 11.5–14.5)
WBC: 6.1 10*3/uL (ref 3.6–11.0)

## 2016-12-12 LAB — TROPONIN I

## 2016-12-12 LAB — BRAIN NATRIURETIC PEPTIDE: B NATRIURETIC PEPTIDE 5: 42 pg/mL (ref 0.0–100.0)

## 2016-12-12 MED ORDER — FUROSEMIDE 20 MG PO TABS: 20.0000 mg | ORAL_TABLET | Freq: Every day | ORAL | 11 refills | Status: DC

## 2016-12-12 NOTE — ED Notes (Signed)
Lights dimmed for patient comfort, pt denies any needs at this time.

## 2016-12-12 NOTE — ED Triage Notes (Signed)
Bilateral leg edema x 3 days. Slight SOB

## 2016-12-12 NOTE — ED Provider Notes (Signed)
[facility] Emergency Department Provider Note   ____________________________________________   First MD Initiated Contact with Patient 12/12/16 [phone]     (approximate)  I have reviewed the triage vital signs and the nursing notes.   HISTORY  Chief Complaint Leg Swelling    HPI Vanessa Fisher is a 56 y.o. female Who reports she's had some leg swelling and slight shortness of breath especially when she lays down flat 3 days. Shortness breath is very mild. She's had leg edema before and was brought Lasix for a while but then taken off of it. He is unsure if she was diagnosed with congestive heart failure or not. She has had one of her grandparents died of a heart attack.she is not having any shortness of breath or chest pain now. Her ankles are somewhat swollen.   Past Medical History:  Diagnosis Date  . Asthma   . Bile acid malabsorption syndrome   . Depression   . Diabetes mellitus without complication ([place])   . Diverticulitis   . Gastric reflux   . Hand pain 05/12/2016    Patient Active Problem List   Diagnosis Date Noted  . Lumbar facet osteoarthritis 11/10/2016  . Chronic musculoskeletal pain 11/05/2016  . Lumbar radiculitis (Right) (L4) 09/28/2016  . Vitamin D insufficiency 07/30/2016  . Chronic pain syndrome 07/08/2016  . Long term (current) use of opiate analgesic 07/08/2016  . Long term prescription opiate use 07/08/2016  . Opiate use (30 MME/day) 07/08/2016  . Chronic low back pain (Primary Source of Pain) (midline) ( (Bilateral) (L>R) 07/08/2016  . Disturbance of skin sensation 07/08/2016  . Chronic neck pain (Secondary source of pain) (midline) (Bilateral) (L>R) 07/08/2016  . DDD (degenerative disc disease), cervical 07/08/2016  . DDD (degenerative disc disease), lumbar 07/08/2016  . Cervical spondylosis 07/08/2016  . Cervical radicular pain (Right) (C7/C8) 07/08/2016  . Upper extremity numbness (Right) 07/08/2016  . Cervical  facet syndrome (Bilateral) (L>R) 07/08/2016  . Osteoarthritis of knee (Bilateral) (R>L) 07/08/2016  . Chronic lower extremity pain (Right) 07/08/2016  . Lumbar facet syndrome (Bilateral) (L>R) 07/08/2016  . Grade 1 Anterolisthesis of L4 over L5 (3 mm) 07/08/2016  . Grade 1 Retrolisthesis of L5 over S1 (5 mm) 07/08/2016  . Chronic knee pain [facility] source of pain) (Bilateral) (R>L) 05/12/2016    Past Surgical History:  Procedure Laterality Date  . PARTIAL HYSTERECTOMY    . TUBAL LIGATION      Prior to Admission medications   Medication Sig Start Date End Date Taking? Authorizing Provider  aspirin 81 MG tablet Take 81 mg by mouth daily.   Yes [provider]  cetirizine (ZYRTEC) 10 MG tablet Take 10 mg by mouth daily.   Yes [provider]  fluticasone (FLONASE) 50 MCG/ACT nasal spray Place 1 spray into both nostrils daily.   Yes [provider]  glimepiride (AMARYL) 4 MG tablet Take 4 mg by mouth daily with breakfast.   Yes [provider]  lisinopril (PRINIVIL,ZESTRIL) 10 MG tablet Take 10 mg by mouth daily. 09/29/16  Yes [provider]  nitrofurantoin, macrocrystal-monohydrate, (MACROBID) 100 MG capsule Take 1 capsule every 12 (twelve) hours by mouth. 11/24/16  Yes [provider]  pantoprazole (PROTONIX) 40 MG tablet Take 40 mg by mouth daily.   Yes [provider]  albuterol (PROVENTIL HFA;VENTOLIN HFA) 108 (90 BASE) MCG/ACT inhaler Inhale 2 puffs into the lungs every 6 (six) hours as needed for wheezing or shortness of breath.  [provider]  furosemide (LASIX) 20 MG tablet Take 1 tablet (20 mg total) daily by mouth. take one half pill every other day if needed for swelling. 12/12/16 12/12/17  Nena Polio, MD  meloxicam (MOBIC) 15 MG tablet Take 1 tablet (15 mg total) by mouth daily. Patient not taking: Reported on 12/12/2016 11/05/16 11/05/17  Vevelyn Francois, NP  traMADol (ULTRAM) 50 MG tablet Take 2  tablets (100 mg total) by mouth every 6 (six) hours as needed for severe pain. 11/05/16 12/05/16  Vevelyn Francois, NP    Allergies Bc powder  [aspirin-salicylamide-caffeine]; Hydrocodone-acetaminophen; Penicillin g; and Penicillins  No family history on file.  Social History Social History   Tobacco Use  . Smoking status: Never Smoker  . Smokeless tobacco: Never Used  Substance Use Topics  . Alcohol use: No  . Drug use: No    review of systems Constitutional: No fever/chills Eyes: No visual changes. ENT: No sore throat. Cardiovascular: Denies chest pain. Respiratory: see history of present illness. Gastrointestinal: No abdominal pain.  No nausea, no vomiting.  No diarrhea.  No constipation. Genitourinary: Negative for dysuria. Musculoskeletal: Negative for back pain. Skin: Negative for rash. Neurological: Negative for headaches, focal weakness  ____________________________________________   PHYSICAL EXAM:  VITAL SIGNS: ED Triage Vitals  Enc Vitals Group     BP 12/12/16 0755 (!) 131/55     Pulse Rate 12/12/16 0755 86     Resp 12/12/16 0755 20     Temp 12/12/16 0755 98.3 F (36.8 C)     Temp Source 12/12/16 0755 Oral     SpO2 12/12/16 0755 100 %     Weight 12/12/16 0757 240 lb (108.9 kg)     Height 12/12/16 0757 5\' 7"  (1.702 m)     Head Circumference --      Peak Flow --      Pain Score 12/12/16 0755 10     Pain Loc --      Pain Edu? --      Excl. in [place]? --     Constitutional: Alert and oriented. Well appearing and in no acute distress. Eyes: Conjunctivae are normal.  Head: Atraumatic. Nose: No congestion/rhinnorhea. Mouth/Throat: Mucous membranes are moist.  Oropharynx non-erythematous. Neck: No stridor. Cardiovascular: Normal rate, regular rhythm. Grossly normal heart sounds.  Good peripheral circulation. Respiratory: Normal respiratory effort.  No retractions. Lungs CTAB. Gastrointestinal: Soft and nontender. No distention. No abdominal bruits. No CVA  tenderness. Musculoskeletal: No lower extremity tenderness 1+ edema.  No joint effusions. Neurologic:  Normal speech and language. No gross focal neurologic deficits are appreciated.  Skin:  Skin is warm, dry and intact. No rash noted. Psychiatric: Mood and affect are normal. Speech and behavior are normal.  ____________________________________________   LABS (all labs ordered are listed, but only abnormal results are displayed)  Labs Reviewed  CBC - Abnormal; Notable for the following components:      Result Value   Hemoglobin 11.0 (*)    HCT 34.1 (*)    MCH 25.9 (*)    RDW 15.4 (*)    All other components within normal limits  COMPREHENSIVE METABOLIC PANEL - Abnormal; Notable for the following components:   Potassium 3.4 (*)    Calcium 8.7 (*)    Albumin 3.3 (*)    Total Bilirubin 0.2 (*)    All other components within normal limits  TROPONIN I  BRAIN NATRIURETIC PEPTIDE   ____________________________________________  EKG  EKG read and interpreted by me  shows normal sinus rhythm at a rate of 75 left axis no acute ST-T wave changes ____________________________________________  RADIOLOGY  Dg Chest 2 View  Result Date: 12/12/2016 CLINICAL DATA:  Patient reports bilateral leg swelling x3 days. Reports some SOB. Hx asthma, DM. Non-smoker. EXAM: CHEST  2 VIEW COMPARISON:  04/01/2013 FINDINGS: The heart size and mediastinal contours are within normal limits. Both lungs are clear. No pleural effusion or pneumothorax. The visualized skeletal structures are unremarkable. IMPRESSION: No active cardiopulmonary disease. Electronically Signed   By: Lajean Manes M.D.   On: 12/12/2016 08:21   chest x-ray reviewed by me heart is normal size there no focal infiltrates. ____________________________________________   PROCEDURES  Procedure(s) performed:  Procedures  Critical Care performed:   ____________________________________________   INITIAL IMPRESSION / ASSESSMENT AND PLAN  / ED COURSE  As part of my medical decision making, I reviewed the following data within the electronic records were reviewed there are no pertinent that we'll records in either care everywhere or chart review in the hospital here. Most of the records here deal of low back pain or osteoarthritis. discussed using some Lasix with her she will follow-up with her regular doctor. There is no evidence of congestive failure or heart injury. This may just be dependent edema as I think it is.     ____________________________________________   FINAL CLINICAL IMPRESSION(S) / ED DIAGNOSES  Final diagnoses:  Peripheral edema     ED Discharge Orders        Ordered    furosemide (LASIX) 20 MG tablet  Daily     12/12/16 1119       Note:  This document was prepared using Dragon voice recognition software and may include unintentional dictation errors.    Nena Polio, MD 12/12/16 [phone]

## 2016-12-12 NOTE — ED Notes (Signed)
Pt resting quietly with eyes closed, respirations equal and unlabored. Awaiting update from MD.

## 2016-12-12 NOTE — Discharge Instructions (Signed)
The lab work chest x-ray and the rest of the tests we did look okay. I will give you a little bit of a fluid pill he can take a half of the Lasix every other day if needed for the swelling try to exercise a little bit and keep your feet up when you're resting that should help considerably. Please return if you're worse. Please follow-up with your regular doctor in the next few days to see how you're doing.

## 2016-12-15 ENCOUNTER — Ambulatory Visit: Payer: Medicare Other | Attending: Nurse Practitioner | Admitting: Nurse Practitioner

## 2017-02-05 ENCOUNTER — Emergency Department: Payer: Medicare Other

## 2017-02-05 ENCOUNTER — Other Ambulatory Visit: Payer: Self-pay

## 2017-02-05 DIAGNOSIS — R079 Chest pain, unspecified: Secondary | ICD-10-CM | POA: Insufficient documentation

## 2017-02-05 DIAGNOSIS — J45909 Unspecified asthma, uncomplicated: Secondary | ICD-10-CM | POA: Diagnosis not present

## 2017-02-05 DIAGNOSIS — Z7982 Long term (current) use of aspirin: Secondary | ICD-10-CM | POA: Diagnosis not present

## 2017-02-05 DIAGNOSIS — R0789 Other chest pain: Secondary | ICD-10-CM | POA: Diagnosis not present

## 2017-02-05 DIAGNOSIS — E119 Type 2 diabetes mellitus without complications: Secondary | ICD-10-CM | POA: Insufficient documentation

## 2017-02-05 DIAGNOSIS — Z79899 Other long term (current) drug therapy: Secondary | ICD-10-CM | POA: Diagnosis not present

## 2017-02-05 LAB — BASIC METABOLIC PANEL
Anion gap: 9 (ref 5–15)
BUN: 15 mg/dL (ref 6–20)
CHLORIDE: 106 mmol/L (ref 101–111)
CO2: 25 mmol/L (ref 22–32)
CREATININE: 0.86 mg/dL (ref 0.44–1.00)
Calcium: 9 mg/dL (ref 8.9–10.3)
GFR calc non Af Amer: >60 mL/min (ref >60)
GLUCOSE: 112 mg/dL — AB (ref 65–99)
Potassium: 3.8 mmol/L (ref 3.5–5.1)
Sodium: 140 mmol/L (ref 135–145)

## 2017-02-05 LAB — CBC
HCT: 37.1 % (ref 35.0–47.0)
HEMOGLOBIN: 12.1 g/dL (ref 12.0–16.0)
MCH: 25.7 pg — AB (ref 26.0–34.0)
MCHC: 32.7 g/dL (ref 32.0–36.0)
MCV: 78.4 fL — AB (ref 80.0–100.0)
PLATELETS: 314 10*3/uL (ref 150–440)
RBC: 4.73 MIL/uL (ref 3.80–5.20)
RDW: 15.1 % — ABNORMAL HIGH (ref 11.5–14.5)
WBC: 7.7 10*3/uL (ref 3.6–11.0)

## 2017-02-05 LAB — TROPONIN I: Troponin I: <0.03 ng/mL (ref <0.03)

## 2017-02-05 NOTE — ED Triage Notes (Signed)
Patient with complaint of central chest pain that started 2 days ago. Patient states that the pain became worse tonight. Patient denies shortness of breath.

## 2017-02-06 ENCOUNTER — Emergency Department
Admission: EM | Admit: 2017-02-06 | Discharge: 2017-02-06 | Disposition: A | Discharge status: Home / Self Care | Payer: Medicare Other | Attending: Emergency Medicine | Admitting: Emergency Medicine

## 2017-02-06 DIAGNOSIS — R079 Chest pain, unspecified: Secondary | ICD-10-CM

## 2017-02-06 DIAGNOSIS — R0789 Other chest pain: Secondary | ICD-10-CM

## 2017-02-06 LAB — TROPONIN I: Troponin I: <0.03 ng/mL (ref <0.03)

## 2017-02-06 MED ORDER — MELOXICAM 15 MG PO TABS: 15.0000 mg | ORAL_TABLET | Freq: Every day | ORAL | 0 refills | Status: DC

## 2017-02-06 MED ORDER — KETOROLAC TROMETHAMINE 30 MG/ML IJ SOLN
60.0000 mg | Freq: Once | INTRAMUSCULAR | Status: AC
  Administered 2017-02-06: 60 mg via INTRAMUSCULAR
  Filled 2017-02-06: qty 2

## 2017-02-06 NOTE — ED Notes (Signed)
Reviewed discharge instructions, follow-up care, and prescriptions with patient. Patient verbalized understanding of all information reviewed. Patient stable, with no distress noted at this time.    

## 2017-02-06 NOTE — Discharge Instructions (Signed)
1.  You may take meloxicam daily as needed for chest wall pain. 2.  Apply moist heat to affected area several times daily. 3.  Return to the ER for worsening symptoms, persistent vomiting, difficulty breathing or other concerns.

## 2017-02-06 NOTE — ED Provider Notes (Signed)
[facility] Emergency Department Provider Note   ____________________________________________   First MD Initiated Contact with Patient 02/06/17 0245     (approximate)  I have reviewed the triage vital signs and the nursing notes.   HISTORY  Chief Complaint Chest Pain    HPI Vanessa Fisher is a 57 y.o. female who presents to the ED from home with a chief complaint of chest pain.  Patient reports a 2-day history of constant pain at her sternum which is worsened by raising her arms or moving her trunk.  Recent bronchitis 2 weeks ago.  Pain is not associated with diaphoresis, shortness of breath, nausea/vomiting, palpitations or dizziness.  Patient denies abdominal pain, dysuria, diarrhea.  Denies recent travel or trauma.   Past Medical History:  Diagnosis Date  . Asthma   . Bile acid malabsorption syndrome   . Depression   . Diabetes mellitus without complication ([place])   . Diverticulitis   . Gastric reflux   . Hand pain 05/12/2016    Patient Active Problem List   Diagnosis Date Noted  . Lumbar facet osteoarthritis 11/10/2016  . Chronic musculoskeletal pain 11/05/2016  . Lumbar radiculitis (Right) (L4) 09/28/2016  . Vitamin D insufficiency 07/30/2016  . Chronic pain syndrome 07/08/2016  . Long term (current) use of opiate analgesic 07/08/2016  . Long term prescription opiate use 07/08/2016  . Opiate use (30 MME/day) 07/08/2016  . Chronic low back pain (Primary Source of Pain) (midline) ( (Bilateral) (L>R) 07/08/2016  . Disturbance of skin sensation 07/08/2016  . Chronic neck pain (Secondary source of pain) (midline) (Bilateral) (L>R) 07/08/2016  . DDD (degenerative disc disease), cervical 07/08/2016  . DDD (degenerative disc disease), lumbar 07/08/2016  . Cervical spondylosis 07/08/2016  . Cervical radicular pain (Right) (C7/C8) 07/08/2016  . Upper extremity numbness (Right) 07/08/2016  . Cervical facet syndrome (Bilateral) (L>R)  07/08/2016  . Osteoarthritis of knee (Bilateral) (R>L) 07/08/2016  . Chronic lower extremity pain (Right) 07/08/2016  . Lumbar facet syndrome (Bilateral) (L>R) 07/08/2016  . Grade 1 Anterolisthesis of L4 over L5 (3 mm) 07/08/2016  . Grade 1 Retrolisthesis of L5 over S1 (5 mm) 07/08/2016  . Chronic knee pain [facility] source of pain) (Bilateral) (R>L) 05/12/2016    Past Surgical History:  Procedure Laterality Date  . PARTIAL HYSTERECTOMY    . TUBAL LIGATION      Prior to Admission medications   Medication Sig Start Date End Date Taking? Authorizing Provider  albuterol (PROVENTIL HFA;VENTOLIN HFA) 108 (90 BASE) MCG/ACT inhaler Inhale 2 puffs into the lungs every 6 (six) hours as needed for wheezing or shortness of breath.    [provider]  aspirin 81 MG tablet Take 81 mg by mouth daily.    [provider]  cetirizine (ZYRTEC) 10 MG tablet Take 10 mg by mouth daily.    [provider]  fluticasone (FLONASE) 50 MCG/ACT nasal spray Place 1 spray into both nostrils daily.    [provider]  furosemide (LASIX) 20 MG tablet Take 1 tablet (20 mg total) daily by mouth. take one half pill every other day if needed for swelling. 12/12/16 12/12/17  Nena Polio, MD  glimepiride (AMARYL) 4 MG tablet Take 4 mg by mouth daily with breakfast.    [provider]  lisinopril (PRINIVIL,ZESTRIL) 10 MG tablet Take 10 mg by mouth daily. 09/29/16   [provider]  meloxicam (MOBIC) 15 MG tablet Take 1 tablet (15 mg total) by mouth daily. Patient not taking:  Reported on 12/12/2016 11/05/16 11/05/17  Vevelyn Francois, NP  nitrofurantoin, macrocrystal-monohydrate, (MACROBID) 100 MG capsule Take 1 capsule every 12 (twelve) hours by mouth. 11/24/16   [provider]  pantoprazole (PROTONIX) 40 MG tablet Take 40 mg by mouth daily.    [provider]  traMADol (ULTRAM) 50 MG tablet Take 2 tablets (100 mg total) by mouth every 6 (six) hours as  needed for severe pain. 11/05/16 12/05/16  Vevelyn Francois, NP    Allergies Bc powder  [aspirin-salicylamide-caffeine]; Hydrocodone-acetaminophen; Penicillin g; and Penicillins  No family history on file.  Social History Social History   Tobacco Use  . Smoking status: Never Smoker  . Smokeless tobacco: Never Used  Substance Use Topics  . Alcohol use: No  . Drug use: No    Review of Systems  Constitutional: No fever/chills. Eyes: No visual changes. ENT: No sore throat. Cardiovascular: Positive for chest pain. Respiratory: Denies shortness of breath. Gastrointestinal: No abdominal pain.  No nausea, no vomiting.  No diarrhea.  No constipation. Genitourinary: Negative for dysuria. Musculoskeletal: Negative for back pain. Skin: Negative for rash. Neurological: Negative for headaches, focal weakness or numbness.   ____________________________________________   PHYSICAL EXAM:  VITAL SIGNS: ED Triage Vitals  Enc Vitals Group     BP 02/05/17 2326 (!) 138/58     Pulse Rate 02/05/17 2326 70     Resp 02/05/17 2326 20     Temp 02/05/17 2326 98.2 F (36.8 C)     Temp Source 02/05/17 2326 Oral     SpO2 02/05/17 2326 97 %     Weight 02/05/17 2323 240 lb (108.9 kg)     Height 02/05/17 2323 5\' 7"  (1.702 m)     Head Circumference --      Peak Flow --      Pain Score --      Pain Loc --      Pain Edu? --      Excl. in [place]? --     Constitutional: Sleeping, awakened for exam.  Alert and oriented. Well appearing and in no acute distress. Eyes: Conjunctivae are normal. PERRL. EOMI. Head: Atraumatic. Nose: No congestion/rhinnorhea. Mouth/Throat: Mucous membranes are moist.  Oropharynx non-erythematous. Neck: No stridor.   Cardiovascular: Normal rate, regular rhythm. Grossly normal heart sounds.  Good peripheral circulation. Respiratory: Normal respiratory effort.  No retractions. Lungs CTAB.  Tender to palpation anterior sternum and xiphoid process.  Pain on movement of trunk and  raising arms. Gastrointestinal: Soft and nontender. No distention. No abdominal bruits. No CVA tenderness. Musculoskeletal: No lower extremity tenderness nor edema.  No joint effusions. Neurologic:  Normal speech and language. No gross focal neurologic deficits are appreciated. No gait instability. Skin:  Skin is warm, dry and intact. No rash noted. Psychiatric: Mood and affect are normal. Speech and behavior are normal.  ____________________________________________   LABS (all labs ordered are listed, but only abnormal results are displayed)  Labs Reviewed  BASIC METABOLIC PANEL - Abnormal; Notable for the following components:      Result Value   Glucose, Bld 112 (*)    All other components within normal limits  CBC - Abnormal; Notable for the following components:   MCV 78.4 (*)    MCH 25.7 (*)    RDW 15.1 (*)    All other components within normal limits  TROPONIN I  TROPONIN I   ____________________________________________  EKG  ED ECG REPORT I, Airam Heidecker J, the attending physician, personally viewed and interpreted this  ECG.   Date: 02/06/2017  EKG Time: 2323  Rate: 66  Rhythm: normal EKG, normal sinus rhythm  Axis: Normal  Intervals:none  ST&T Change: Nonspecific  ____________________________________________  RADIOLOGY  Dg Chest 2 View  Result Date: 02/05/2017 CLINICAL DATA:  Patient with complaint of central chest pain that started 2 days ago. Patient states that the pain became worse tonight. Patient denies shortness of breath. EXAM: CHEST  2 VIEW COMPARISON:  12/12/2016 FINDINGS: The heart size and mediastinal contours are within normal limits. Both lungs are clear. The visualized skeletal structures are unremarkable. IMPRESSION: No active cardiopulmonary disease. Electronically Signed   By: Lucienne Capers M.D.   On: 02/05/2017 23:53    ____________________________________________   PROCEDURES  Procedure(s) performed: None  Procedures  Critical Care  performed: No  ____________________________________________   INITIAL IMPRESSION / ASSESSMENT AND PLAN / ED COURSE  As part of my medical decision making, I reviewed the following data within the [facility] notes reviewed and incorporated, Labs reviewed, EKG interpreted, Old chart reviewed, Radiograph reviewed and Notes from prior ED visits.   57 year old female who presents with constant chest wall pain for 2 days. Differential diagnosis includes, but is not limited to, ACS, aortic dissection, pulmonary embolism, cardiac tamponade, pneumothorax, pneumonia, pericarditis, myocarditis, GI-related causes including esophagitis/gastritis, and musculoskeletal chest wall pain.    Initial EKG and troponin are unremarkable.  Will repeat troponin.  Clinically patient's symptoms are suggestive of chest wall pain.  I see she has an active opioid contract; will administer IM Toradol.  Clinical Course as of Feb 06 533  Sat Feb 06, 2017  2841 Patient sleeping. Updated her of repeat negative troponin.  Strict return precautions given.  Patient verbalizes understanding and agrees with plan of care.  [JS]    Clinical Course User Index [JS] Paulette Blanch, MD     ____________________________________________   FINAL CLINICAL IMPRESSION(S) / ED DIAGNOSES  Final diagnoses:  Nonspecific chest pain  Chest wall pain     ED Discharge Orders    None       Note:  This document was prepared using Dragon voice recognition software and may include unintentional dictation errors.    Paulette Blanch, MD 02/06/17 [phone]

## 2017-06-18 ENCOUNTER — Ambulatory Visit
Admission: EM | Admit: 2017-06-18 | Discharge: 2017-06-18 | Disposition: A | Discharge status: Home / Self Care | Payer: Medicare Other | Attending: Family Medicine | Admitting: Family Medicine

## 2017-06-18 ENCOUNTER — Encounter: Payer: Self-pay | Admitting: Gynecology

## 2017-06-18 ENCOUNTER — Other Ambulatory Visit: Payer: Self-pay

## 2017-06-18 DIAGNOSIS — J45909 Unspecified asthma, uncomplicated: Secondary | ICD-10-CM | POA: Insufficient documentation

## 2017-06-18 DIAGNOSIS — Z886 Allergy status to analgesic agent status: Secondary | ICD-10-CM | POA: Insufficient documentation

## 2017-06-18 DIAGNOSIS — M5010 Cervical disc disorder with radiculopathy, unspecified cervical region: Secondary | ICD-10-CM | POA: Diagnosis not present

## 2017-06-18 DIAGNOSIS — B373 Candidiasis of vulva and vagina: Secondary | ICD-10-CM

## 2017-06-18 DIAGNOSIS — Z885 Allergy status to narcotic agent status: Secondary | ICD-10-CM | POA: Diagnosis not present

## 2017-06-18 DIAGNOSIS — B9689 Other specified bacterial agents as the cause of diseases classified elsewhere: Secondary | ICD-10-CM | POA: Insufficient documentation

## 2017-06-18 DIAGNOSIS — M5116 Intervertebral disc disorders with radiculopathy, lumbar region: Secondary | ICD-10-CM | POA: Diagnosis not present

## 2017-06-18 DIAGNOSIS — M25562 Pain in left knee: Secondary | ICD-10-CM | POA: Insufficient documentation

## 2017-06-18 DIAGNOSIS — M25561 Pain in right knee: Secondary | ICD-10-CM | POA: Insufficient documentation

## 2017-06-18 DIAGNOSIS — M4722 Other spondylosis with radiculopathy, cervical region: Secondary | ICD-10-CM | POA: Insufficient documentation

## 2017-06-18 DIAGNOSIS — Z79899 Other long term (current) drug therapy: Secondary | ICD-10-CM | POA: Insufficient documentation

## 2017-06-18 DIAGNOSIS — Z880 Allergy status to penicillin: Secondary | ICD-10-CM | POA: Insufficient documentation

## 2017-06-18 DIAGNOSIS — E119 Type 2 diabetes mellitus without complications: Secondary | ICD-10-CM | POA: Diagnosis not present

## 2017-06-18 DIAGNOSIS — F329 Major depressive disorder, single episode, unspecified: Secondary | ICD-10-CM | POA: Insufficient documentation

## 2017-06-18 DIAGNOSIS — Z8249 Family history of ischemic heart disease and other diseases of the circulatory system: Secondary | ICD-10-CM | POA: Diagnosis not present

## 2017-06-18 DIAGNOSIS — L237 Allergic contact dermatitis due to plants, except food: Secondary | ICD-10-CM | POA: Insufficient documentation

## 2017-06-18 DIAGNOSIS — G894 Chronic pain syndrome: Secondary | ICD-10-CM | POA: Diagnosis not present

## 2017-06-18 DIAGNOSIS — K219 Gastro-esophageal reflux disease without esophagitis: Secondary | ICD-10-CM | POA: Insufficient documentation

## 2017-06-18 DIAGNOSIS — Z7982 Long term (current) use of aspirin: Secondary | ICD-10-CM | POA: Insufficient documentation

## 2017-06-18 DIAGNOSIS — Z87440 Personal history of urinary (tract) infections: Secondary | ICD-10-CM | POA: Insufficient documentation

## 2017-06-18 DIAGNOSIS — M7918 Myalgia, other site: Secondary | ICD-10-CM | POA: Insufficient documentation

## 2017-06-18 DIAGNOSIS — E559 Vitamin D deficiency, unspecified: Secondary | ICD-10-CM | POA: Diagnosis not present

## 2017-06-18 DIAGNOSIS — N760 Acute vaginitis: Secondary | ICD-10-CM | POA: Insufficient documentation

## 2017-06-18 DIAGNOSIS — B3731 Acute candidiasis of vulva and vagina: Secondary | ICD-10-CM

## 2017-06-18 DIAGNOSIS — N390 Urinary tract infection, site not specified: Secondary | ICD-10-CM | POA: Diagnosis present

## 2017-06-18 LAB — URINALYSIS, COMPLETE (UACMP) WITH MICROSCOPIC
BILIRUBIN URINE: NEGATIVE
Glucose, UA: NEGATIVE mg/dL
KETONES UR: NEGATIVE mg/dL
NITRITE: NEGATIVE
Protein, ur: NEGATIVE mg/dL
SPECIFIC GRAVITY, URINE: 1.02 (ref 1.005–1.030)
pH: 6 (ref 5.0–8.0)

## 2017-06-18 LAB — WET PREP, GENITAL
Sperm: NONE SEEN
Trich, Wet Prep: NONE SEEN

## 2017-06-18 MED ORDER — MUPIROCIN 2 % EX OINT: 1.0000 "application " | TOPICAL_OINTMENT | Freq: Three times a day (TID) | CUTANEOUS | 0 refills | Status: DC

## 2017-06-18 MED ORDER — KETOCONAZOLE 2 % EX CREA: TOPICAL_CREAM | CUTANEOUS | 0 refills | Status: DC

## 2017-06-18 MED ORDER — PREDNISONE 10 MG (21) PO TBPK: ORAL_TABLET | Freq: Every day | ORAL | 0 refills | Status: DC

## 2017-06-18 MED ORDER — FLUCONAZOLE 150 MG PO TABS: ORAL_TABLET | ORAL | 0 refills | Status: DC

## 2017-06-18 MED ORDER — METRONIDAZOLE 500 MG PO TABS: 500.0000 mg | ORAL_TABLET | Freq: Two times a day (BID) | ORAL | 0 refills | Status: DC
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
## 2017-06-18 NOTE — ED Triage Notes (Signed)
Per patient c/o poison ivy x 3 days ago.

## 2017-06-20 LAB — URINE CULTURE: Culture: NO GROWTH

## 2017-06-27 ENCOUNTER — Other Ambulatory Visit: Payer: Self-pay

## 2017-06-27 ENCOUNTER — Ambulatory Visit
Admission: EM | Admit: 2017-06-27 | Discharge: 2017-06-27 | Disposition: A | Discharge status: Home / Self Care | Payer: Medicare Other | Attending: Family Medicine | Admitting: Family Medicine

## 2017-06-27 DIAGNOSIS — J302 Other seasonal allergic rhinitis: Secondary | ICD-10-CM | POA: Diagnosis not present

## 2017-06-27 DIAGNOSIS — M25532 Pain in left wrist: Secondary | ICD-10-CM | POA: Diagnosis not present

## 2017-06-27 MED ORDER — PREDNISONE 20 MG PO TABS: ORAL_TABLET | ORAL | 0 refills | Status: DC

## 2017-06-27 MED ORDER — TRAMADOL HCL 50 MG PO TABS: ORAL_TABLET | ORAL | 0 refills | Status: DC

## 2017-06-27 NOTE — ED Triage Notes (Signed)
Patient complains of left wrist pain that started 2 weeks ago. Patient states that she has carpal tunnel and has been trying to use a brace.  Patient states that she has been having seasonal allergies going on.

## 2017-06-27 NOTE — Discharge Instructions (Signed)
Ice to wrist

## 2017-06-27 NOTE — ED Provider Notes (Addendum)
[facility]    CSN: [id number] Arrival date & time: 06/27/17  0949     History   Chief Complaint Chief Complaint  Patient presents with  . Wrist Pain  . Nasal Congestion    HPI Vanessa Fisher is a 57 y.o. female.   57 yo female with chronic pain syndrome presents with a c/o left wrist pain and seasonal allergies. Denies any falls or direct trauma to her wrist, but had been doing some repetitive gripping and grabbing work recently. Patient states she is not currently seeing a pain specialist and the last time seen by them was about 5 months ago.   The history is provided by the patient.  Wrist Pain     Past Medical History:  Diagnosis Date  . Asthma   . Bile acid malabsorption syndrome   . Depression   . Diabetes mellitus without complication ([place])   . Diverticulitis   . Gastric reflux   . Hand pain 05/12/2016    Patient Active Problem List   Diagnosis Date Noted  . Lumbar facet osteoarthritis 11/10/2016  . Chronic musculoskeletal pain 11/05/2016  . Lumbar radiculitis (Right) (L4) 09/28/2016  . Vitamin D insufficiency 07/30/2016  . Chronic pain syndrome 07/08/2016  . Long term (current) use of opiate analgesic 07/08/2016  . Long term prescription opiate use 07/08/2016  . Opiate use (30 MME/day) 07/08/2016  . Chronic low back pain (Primary Source of Pain) (midline) ( (Bilateral) (L>R) 07/08/2016  . Disturbance of skin sensation 07/08/2016  . Chronic neck pain (Secondary source of pain) (midline) (Bilateral) (L>R) 07/08/2016  . DDD (degenerative disc disease), cervical 07/08/2016  . DDD (degenerative disc disease), lumbar 07/08/2016  . Cervical spondylosis 07/08/2016  . Cervical radicular pain (Right) (C7/C8) 07/08/2016  . Upper extremity numbness (Right) 07/08/2016  . Cervical facet syndrome (Bilateral) (L>R) 07/08/2016  . Osteoarthritis of knee (Bilateral) (R>L) 07/08/2016  . Chronic lower extremity pain (Right) 07/08/2016  . Lumbar facet  syndrome (Bilateral) (L>R) 07/08/2016  . Grade 1 Anterolisthesis of L4 over L5 (3 mm) 07/08/2016  . Grade 1 Retrolisthesis of L5 over S1 (5 mm) 07/08/2016  . Chronic knee pain [facility] source of pain) (Bilateral) (R>L) 05/12/2016    Past Surgical History:  Procedure Laterality Date  . PARTIAL HYSTERECTOMY    . TUBAL LIGATION      OB History   None      Home Medications    Prior to Admission medications   Medication Sig Start Date End Date Taking? Authorizing Provider  albuterol (PROVENTIL HFA;VENTOLIN HFA) 108 (90 BASE) MCG/ACT inhaler Inhale 2 puffs into the lungs every 6 (six) hours as needed for wheezing or shortness of breath.   Yes [provider]  aspirin 81 MG tablet Take 81 mg by mouth daily.   Yes [provider]  fluticasone (FLONASE) 50 MCG/ACT nasal spray Place 1 spray into both nostrils daily.   Yes [provider]  lisinopril (PRINIVIL,ZESTRIL) 10 MG tablet Take 10 mg by mouth daily. 09/29/16  Yes [provider]  pantoprazole (PROTONIX) 40 MG tablet Take 40 mg by mouth daily.   Yes [provider]  cetirizine (ZYRTEC) 10 MG tablet Take 10 mg by mouth daily.    [provider]  fluconazole (DIFLUCAN) 150 MG tablet Take one tab for symptoms of yeast infection. Repeat x 1 in 72 hours. 06/18/17   Lorin Picket, PA-C  furosemide (LASIX) 20 MG tablet Take 1 tablet (20 mg total) daily by mouth.  take one half pill every other day if needed for swelling. 12/12/16 12/12/17  Nena Polio, MD  ketoconazole (NIZORAL) 2 % cream Apply topically to vaginal area for itching and irritation 06/18/17   Lorin Picket, PA-C  meloxicam (MOBIC) 15 MG tablet Take 1 tablet (15 mg total) by mouth daily. 02/06/17   Paulette Blanch, MD  metroNIDAZOLE (FLAGYL) 500 MG tablet Take 1 tablet (500 mg total) by mouth 2 (two) times daily. 06/18/17   Lorin Picket, PA-C  mupirocin ointment (BACTROBAN) 2 % Apply 1 application topically 3 (three)  times daily. 06/18/17   Lorin Picket, PA-C  predniSONE (DELTASONE) 20 MG tablet 2 tabs po qd x 2 days, then 1 tab po qd x 2 days, then half a tab po qd x 2 day 06/27/17   Norval Gable, MD  traMADol Veatrice Bourbon) 50 MG tablet 1-2 tabs po bid prn 06/27/17   Norval Gable, MD    Family History Family History  Problem Relation Age of Onset  . Diabetes Mother   . Hypertension Mother   . Heart failure Mother   . Cancer Mother   . Diabetes Father   . Hypertension Father     Social History Social History   Tobacco Use  . Smoking status: Never Smoker  . Smokeless tobacco: Never Used  Substance Use Topics  . Alcohol use: No  . Drug use: No     Allergies   Bc powder  [aspirin-salicylamide-caffeine]; Hydrocodone-acetaminophen; Penicillin g; and Penicillins   Review of Systems Review of Systems   Physical Exam Triage Vital Signs ED Triage Vitals  Enc Vitals Group     BP 06/27/17 1010 (!) 159/89     Pulse Rate 06/27/17 1010 60     Resp 06/27/17 1010 17     Temp 06/27/17 1010 98 F (36.7 C)     Temp Source 06/27/17 1010 Oral     SpO2 06/27/17 1010 100 %     Weight 06/27/17 1009 240 lb (108.9 kg)     Height --      Head Circumference --      Peak Flow --      Pain Score 06/27/17 1008 9     Pain Loc --      Pain Edu? --      Excl. in [place]? --    No data found.  Updated Vital Signs BP (!) 159/89 (BP Location: Left Arm)   Pulse 60   Temp 98 F (36.7 C) (Oral)   Resp 17   Wt 240 lb (108.9 kg)   SpO2 100%   BMI 37.59 kg/m   Visual Acuity Right Eye Distance:   Left Eye Distance:   Bilateral Distance:    Right Eye Near:   Left Eye Near:    Bilateral Near:     Physical Exam  Constitutional: She appears well-developed and well-nourished. No distress.  HENT:  Nose: Mucosal edema and rhinorrhea present.  Musculoskeletal:       Left wrist: She exhibits tenderness (along thumb tendon). She exhibits normal range of motion, no bony tenderness, no swelling, no  effusion, no crepitus, no deformity and no laceration.  Skin: She is not diaphoretic.  Nursing note and vitals reviewed.    UC Treatments / Results  Labs (all labs ordered are listed, but only abnormal results are displayed) Labs Reviewed - No data to display  EKG None  Radiology No results found.  Procedures Procedures (including critical care time)  Medications Ordered in UC Medications - No data to display  Initial Impression / Assessment and Plan / UC Course  I have reviewed the triage vital signs and the nursing notes.  Pertinent labs & imaging results that were available during my care of the patient were reviewed by me and considered in my medical decision making (see chart for details).      Final Clinical Impressions(s) / UC Diagnoses   Final diagnoses:  Left wrist pain  Seasonal allergies     Discharge Instructions     Ice to wrist    ED Prescriptions    Medication Sig Dispense Auth. Provider   predniSONE (DELTASONE) 20 MG tablet 2 tabs po qd x 2 days, then 1 tab po qd x 2 days, then half a tab po qd x 2 day 7 tablet Avalene Sealy, Linward Foster, MD   traMADol (ULTRAM) 50 MG tablet  (Status: Discontinued) 1-2 tabs po bid prn 10 tablet Claire Dolores, Linward Foster, MD   traMADol (ULTRAM) 50 MG tablet 1-2 tabs po bid prn 10 tablet Nima Bamburg, Linward Foster, MD     1. diagnosis reviewed with patient 2. rx as per orders above; reviewed possible side effects, interactions, risks and benefits  3. Recommend supportive treatment with rest, ice 4. Follow-up prn if symptoms worsen or don't improve  Controlled Substance Prescriptions [place] Controlled Substance Registry consulted? Not Applicable   Norval Gable, MD 06/27/17 Derma, [place], MD 06/27/17 1131

## 2017-07-13 ENCOUNTER — Emergency Department
Admission: EM | Admit: 2017-07-13 | Discharge: 2017-07-13 | Disposition: A | Discharge status: Home / Self Care | Payer: Medicare Other | Attending: Emergency Medicine | Admitting: Emergency Medicine

## 2017-07-13 ENCOUNTER — Emergency Department: Payer: Medicare Other

## 2017-07-13 ENCOUNTER — Encounter: Payer: Self-pay | Admitting: Emergency Medicine

## 2017-07-13 DIAGNOSIS — J302 Other seasonal allergic rhinitis: Secondary | ICD-10-CM | POA: Insufficient documentation

## 2017-07-13 DIAGNOSIS — E119 Type 2 diabetes mellitus without complications: Secondary | ICD-10-CM | POA: Diagnosis not present

## 2017-07-13 DIAGNOSIS — Z7982 Long term (current) use of aspirin: Secondary | ICD-10-CM | POA: Diagnosis not present

## 2017-07-13 DIAGNOSIS — M778 Other enthesopathies, not elsewhere classified: Secondary | ICD-10-CM

## 2017-07-13 DIAGNOSIS — M779 Enthesopathy, unspecified: Secondary | ICD-10-CM

## 2017-07-13 DIAGNOSIS — Y939 Activity, unspecified: Secondary | ICD-10-CM | POA: Insufficient documentation

## 2017-07-13 DIAGNOSIS — J45909 Unspecified asthma, uncomplicated: Secondary | ICD-10-CM | POA: Diagnosis not present

## 2017-07-13 DIAGNOSIS — M25532 Pain in left wrist: Secondary | ICD-10-CM | POA: Diagnosis present

## 2017-07-13 DIAGNOSIS — M70842 Other soft tissue disorders related to use, overuse and pressure, left hand: Secondary | ICD-10-CM | POA: Diagnosis not present

## 2017-07-13 NOTE — ED Notes (Signed)
See triage note  Presents with pain to left wrist  Unsure of injury  But states she does a lot of lifting.. No deformity noted  Good pulses

## 2017-07-13 NOTE — Discharge Instructions (Addendum)
Your exam is consistent with thumb tendinitis. You also have seasonal allergies. Wear the ace bandage for comfort. Take OTC Tylenol and Naproxen as needed for pain. Follow-up with Dr. Jackqulyn Livings for further evaluation. Follow-up with your PCP in the meantime.

## 2017-07-13 NOTE — ED Provider Notes (Signed)
[facility] Emergency Department Provider Note ____________________________________________  Time seen: 1008  I have reviewed the triage vital signs and the nursing notes.  HISTORY  Chief Complaint  Wrist Pain  HPI Vanessa Fisher is a 57 y.o. female presents herself to the ED for evaluation of left wrist pain as well as some seasonal allergies and nasal drainage.  Patient denies any recent injury, accident, or trauma.  She does admit that she was recently seen by [facility] urgent care about 2 weeks prior for the same exact complaints.  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  She denies any current intervention having recently completed a course of prednisone and Ultram.  She is unaware of any plans to be referred to Ortho for this complaint.  She is denying any chest pain, shortness of breath, nosebleeds, or productive cough.  She has current prescriptions for cetirizine, Flonase, and albuterol.  Past Medical History:  Diagnosis Date  . Asthma   . Bile acid malabsorption syndrome   . Depression   . Diabetes mellitus without complication ([place])   . Diverticulitis   . Gastric reflux   . Hand pain 05/12/2016    Patient Active Problem List   Diagnosis Date Noted  . Lumbar facet osteoarthritis 11/10/2016  . Chronic musculoskeletal pain 11/05/2016  . Lumbar radiculitis (Right) (L4) 09/28/2016  . Vitamin D insufficiency 07/30/2016  . Chronic pain syndrome 07/08/2016  . Long term (current) use of opiate analgesic 07/08/2016  . Long term prescription opiate use 07/08/2016  . Opiate use (30 MME/day) 07/08/2016  . Chronic low back pain (Primary Source of Pain) (midline) ( (Bilateral) (L>R) 07/08/2016  . Disturbance of skin sensation 07/08/2016  . Chronic neck pain (Secondary source of pain) (midline) (Bilateral) (L>R) 07/08/2016  . DDD (degenerative disc disease), cervical  07/08/2016  . DDD (degenerative disc disease), lumbar 07/08/2016  . Cervical spondylosis 07/08/2016  . Cervical radicular pain (Right) (C7/C8) 07/08/2016  . Upper extremity numbness (Right) 07/08/2016  . Cervical facet syndrome (Bilateral) (L>R) 07/08/2016  . Osteoarthritis of knee (Bilateral) (R>L) 07/08/2016  . Chronic lower extremity pain (Right) 07/08/2016  . Lumbar facet syndrome (Bilateral) (L>R) 07/08/2016  . Grade 1 Anterolisthesis of L4 over L5 (3 mm) 07/08/2016  . Grade 1 Retrolisthesis of L5 over S1 (5 mm) 07/08/2016  . Chronic knee pain [facility] source of pain) (Bilateral) (R>L) 05/12/2016    Past Surgical History:  Procedure Laterality Date  . PARTIAL HYSTERECTOMY    . TUBAL LIGATION      Prior to Admission medications   Medication Sig Start Date End Date Taking? Authorizing Provider  albuterol (PROVENTIL HFA;VENTOLIN HFA) 108 (90 BASE) MCG/ACT inhaler Inhale 2 puffs into the lungs every 6 (six) hours as needed for wheezing or shortness of breath.    [provider]  aspirin 81 MG tablet Take 81 mg by mouth daily.    [provider]  cetirizine (ZYRTEC) 10 MG tablet Take 10 mg by mouth daily.    [provider]  fluticasone (FLONASE) 50 MCG/ACT nasal spray Place 1 spray into both nostrils daily.    [provider]  furosemide (LASIX) 20 MG tablet Take 1 tablet (20 mg total) daily by mouth. take one half pill every other day if needed for swelling. 12/12/16 12/12/17  Nena Polio, MD  ketoconazole (NIZORAL) 2 % cream Apply topically to vaginal area for itching and irritation 06/18/17  Crecencio Mc P, PA-C  lisinopril (PRINIVIL,ZESTRIL) 10 MG tablet Take 10 mg by mouth daily. 09/29/16   [provider]  meloxicam (MOBIC) 15 MG tablet Take 1 tablet (15 mg total) by mouth daily. 02/06/17   Paulette Blanch, MD  mupirocin ointment (BACTROBAN) 2 % Apply 1 application topically 3 (three) times daily. 06/18/17   Lorin Picket, PA-C   pantoprazole (PROTONIX) 40 MG tablet Take 40 mg by mouth daily.    [provider]    Allergies Bc powder  [aspirin-salicylamide-caffeine]; Hydrocodone-acetaminophen; Penicillin g; and Penicillins  Family History  Problem Relation Age of Onset  . Diabetes Mother   . Hypertension Mother   . Heart failure Mother   . Cancer Mother   . Diabetes Father   . Hypertension Father     Social History Social History   Tobacco Use  . Smoking status: Never Smoker  . Smokeless tobacco: Never Used  Substance Use Topics  . Alcohol use: No  . Drug use: No    Review of Systems  Constitutional: Negative for fever. Eyes: Negative for visual changes.  Eyes itching and irritation ENT: Negative for sore throat.  Nasal drainage. Cardiovascular: Negative for chest pain. Respiratory: Negative for shortness of breath. Musculoskeletal: Negative for back pain.  Wrist pain as above. Skin: Negative for rash. Neurological: Negative for headaches, focal weakness or numbness. ____________________________________________  PHYSICAL EXAM:  VITAL SIGNS: ED Triage Vitals  Enc Vitals Group     BP 07/13/17 0945 108/62     Pulse Rate 07/13/17 0945 62     Resp 07/13/17 0945 16     Temp 07/13/17 0945 97.8 F (36.6 C)     Temp Source 07/13/17 0945 Oral     SpO2 07/13/17 0945 98 %     Weight 07/13/17 0938 240 lb (108.9 kg)     Height 07/13/17 0938 5\' 7"  (1.702 m)     Head Circumference --      Peak Flow --      Pain Score 07/13/17 0937 9     Pain Loc --      Pain Edu? --      Excl. in [place]? --     Constitutional: Alert and oriented. Well appearing and in no distress. Head: Normocephalic and atraumatic. Eyes: Conjunctivae are normal. PERRL. Normal extraocular movements Ears: Canals clear. TMs intact bilaterally. Nose: No congestion/rhinorrhea/epistaxis.  Turbinates are pink and moist. Mouth/Throat: Mucous membranes are moist.  Uvula is midline and tonsils are flat. Neck: Supple. No  thyromegaly. Hematological/Lymphatic/Immunological: No cervical lymphadenopathy. Cardiovascular: Normal rate, regular rhythm. Normal distal pulses. Respiratory: Normal respiratory effort. No wheezes/rales/rhonchi. Gastrointestinal: Soft and nontender. No distention. Musculoskeletal: Left hand and wrist without obvious deformity, dislocation, or joint effusion.  Patient is mildly tender to palpation over the extensor tendon of the left thumb.  Normal composite fist is noted.  Mildly positive Finkelstein on exam.  Nontender with normal range of motion in all extremities.  Neurologic: Cranial nerves II through XII grossly intact.  Normal UE DTRs bilaterally.  Normal intrinsic and opposition testing noted.  Normal gait without ataxia. Normal speech and language. No gross focal neurologic deficits are appreciated. Skin:  Skin is warm, dry and intact. No rash noted. ____________________________________________  PROCEDURES  Procedures Ace bandage - left thumb/wrist ____________________________________________  INITIAL IMPRESSION / ASSESSMENT AND PLAN / ED COURSE  Patient with ED evaluation of chronic intermittent symptoms of left thumb and wrist pain as well as some seasonal allergies.  Patient exam  is overall benign.  Symptoms represent a likely thumb tendinitis.  She is placed in an Ace bandage for comfort and support.  She is also advised to restart her previously prescribed daily allergy medicines.  She is [facility] to [facility] for ongoing evaluation and management of her symptoms.  She was released to work duties as tolerated and will follow up with the PCP for routine management of her seasonal allergies. ____________________________________________  FINAL CLINICAL IMPRESSION(S) / ED DIAGNOSES  Final diagnoses:  Tendinitis of thumb  Seasonal allergies      Carmie End, Dannielle Karvonen, PA-C 07/13/17 Alphonzo Dublin, MD 07/14/17 [phone]

## 2017-07-13 NOTE — ED Triage Notes (Signed)
Pt reports left wrist pain for a couple of days. Pt denies injuries but states she thinks it may be fractured or her carpal tunnel syndrome is acting up. Pt also reports that she thinks she is having some allergy issues in her eyes.

## 2017-08-11 DIAGNOSIS — M654 Radial styloid tenosynovitis [de Quervain]: Secondary | ICD-10-CM | POA: Insufficient documentation

## 2017-08-25 IMAGING — CR DG KNEE 1-2V*R*
1 series · 2 of 2 positions shown · non-contrast
Comparison: None.

CLINICAL DATA: Chronic knee pain

EXAM:
RIGHT KNEE - 1-2 VIEW

[Series 1: dg knee 1-2 views right · 0.14mm/px · 2 of 2 slices shown]
[im 1/2]
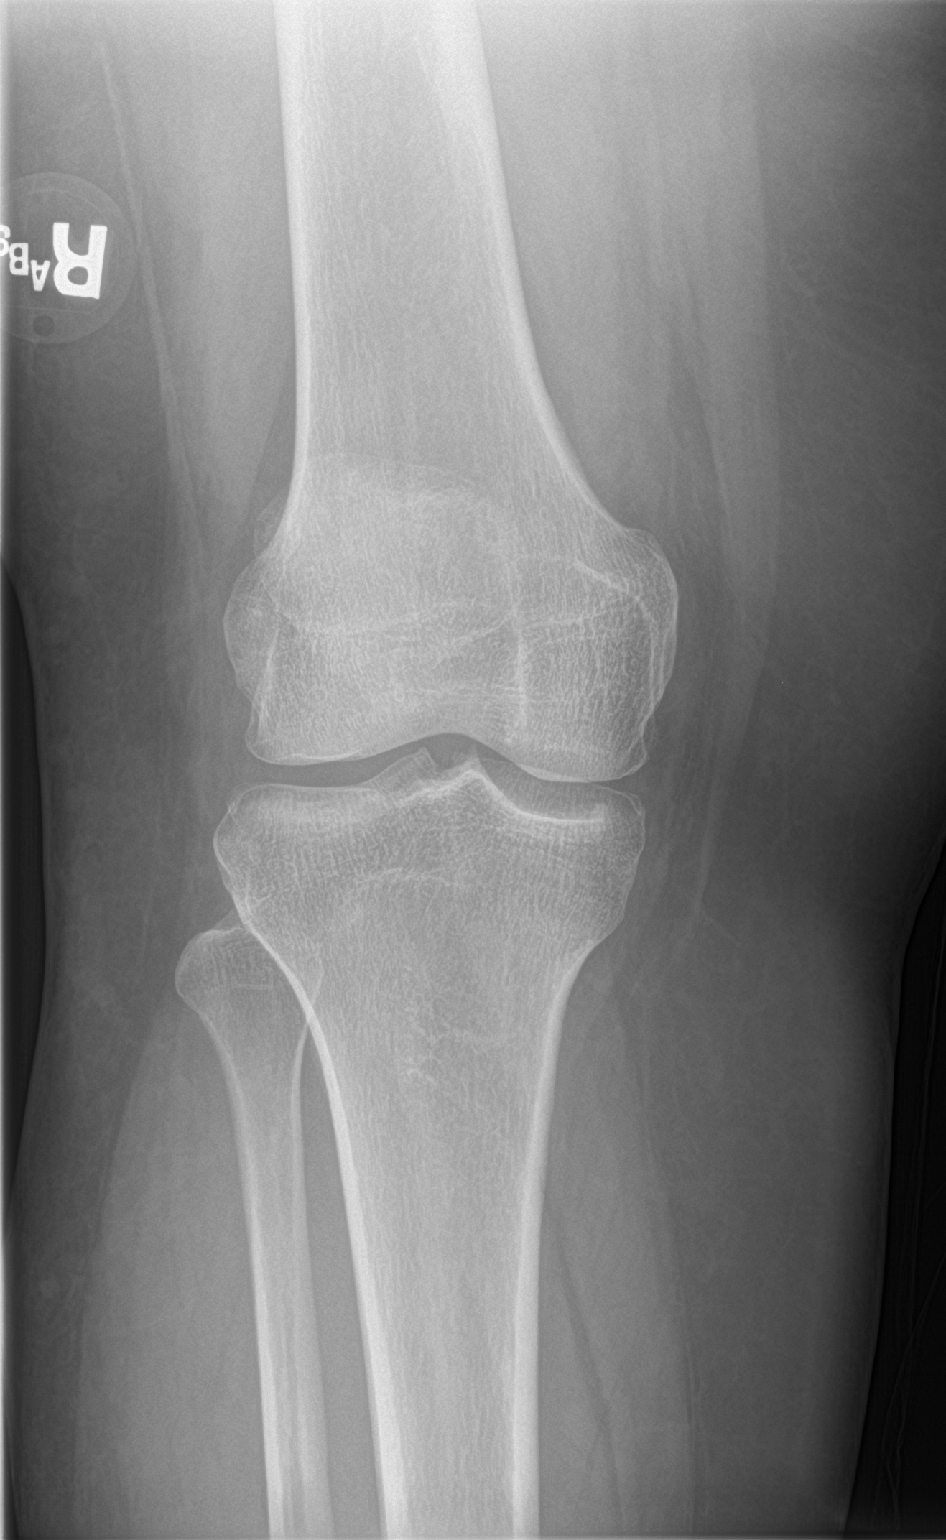
[im 2/2]
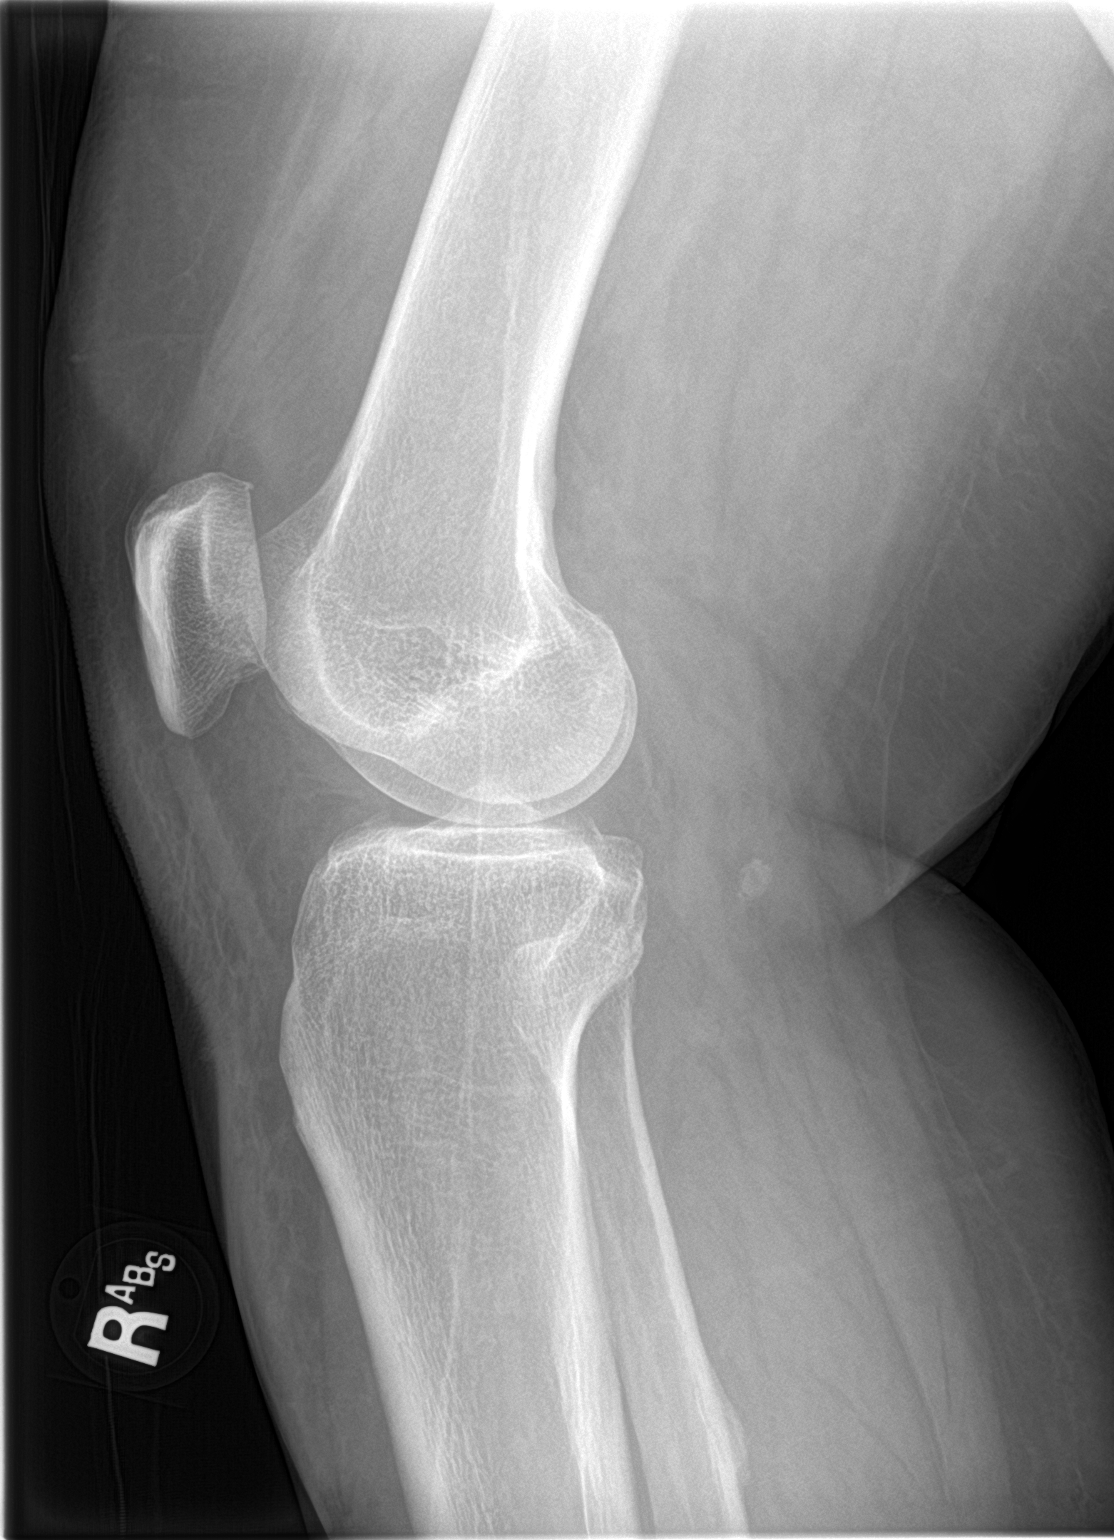

[2 of 2 positions shown; findings below may reference images not displayed]

FINDINGS: No fracture or malalignment. Minimal patellofemoral degenerative
changes with small spurring. No large effusion. Medial and lateral
joint space compartments appear maintained.
IMPRESSION: Minimal patellofemoral degenerative changes. No acute osseous
abnormality.

## 2017-10-03 ENCOUNTER — Other Ambulatory Visit: Payer: Self-pay

## 2017-10-03 ENCOUNTER — Ambulatory Visit
Admission: EM | Admit: 2017-10-03 | Discharge: 2017-10-03 | Disposition: A | Discharge status: Home / Self Care | Payer: Medicare Other | Attending: Family Medicine | Admitting: Family Medicine

## 2017-10-03 ENCOUNTER — Encounter: Payer: Self-pay | Admitting: Emergency Medicine

## 2017-10-03 DIAGNOSIS — B3731 Acute candidiasis of vulva and vagina: Secondary | ICD-10-CM

## 2017-10-03 DIAGNOSIS — J45909 Unspecified asthma, uncomplicated: Secondary | ICD-10-CM | POA: Insufficient documentation

## 2017-10-03 DIAGNOSIS — E119 Type 2 diabetes mellitus without complications: Secondary | ICD-10-CM | POA: Diagnosis not present

## 2017-10-03 DIAGNOSIS — M654 Radial styloid tenosynovitis [de Quervain]: Secondary | ICD-10-CM

## 2017-10-03 DIAGNOSIS — R350 Frequency of micturition: Secondary | ICD-10-CM | POA: Diagnosis not present

## 2017-10-03 DIAGNOSIS — G894 Chronic pain syndrome: Secondary | ICD-10-CM | POA: Diagnosis not present

## 2017-10-03 DIAGNOSIS — B373 Candidiasis of vulva and vagina: Secondary | ICD-10-CM

## 2017-10-03 DIAGNOSIS — Z880 Allergy status to penicillin: Secondary | ICD-10-CM | POA: Diagnosis not present

## 2017-10-03 DIAGNOSIS — M5010 Cervical disc disorder with radiculopathy, unspecified cervical region: Secondary | ICD-10-CM | POA: Diagnosis not present

## 2017-10-03 DIAGNOSIS — Z886 Allergy status to analgesic agent status: Secondary | ICD-10-CM | POA: Diagnosis not present

## 2017-10-03 DIAGNOSIS — M5116 Intervertebral disc disorders with radiculopathy, lumbar region: Secondary | ICD-10-CM | POA: Insufficient documentation

## 2017-10-03 DIAGNOSIS — M4316 Spondylolisthesis, lumbar region: Secondary | ICD-10-CM | POA: Diagnosis not present

## 2017-10-03 DIAGNOSIS — K219 Gastro-esophageal reflux disease without esophagitis: Secondary | ICD-10-CM | POA: Diagnosis not present

## 2017-10-03 DIAGNOSIS — Z79899 Other long term (current) drug therapy: Secondary | ICD-10-CM | POA: Diagnosis not present

## 2017-10-03 DIAGNOSIS — Z8249 Family history of ischemic heart disease and other diseases of the circulatory system: Secondary | ICD-10-CM | POA: Diagnosis not present

## 2017-10-03 DIAGNOSIS — R300 Dysuria: Secondary | ICD-10-CM | POA: Diagnosis present

## 2017-10-03 DIAGNOSIS — M25531 Pain in right wrist: Secondary | ICD-10-CM | POA: Diagnosis not present

## 2017-10-03 DIAGNOSIS — E559 Vitamin D deficiency, unspecified: Secondary | ICD-10-CM | POA: Diagnosis not present

## 2017-10-03 DIAGNOSIS — M4722 Other spondylosis with radiculopathy, cervical region: Secondary | ICD-10-CM | POA: Insufficient documentation

## 2017-10-03 DIAGNOSIS — Z833 Family history of diabetes mellitus: Secondary | ICD-10-CM | POA: Diagnosis not present

## 2017-10-03 DIAGNOSIS — M170 Bilateral primary osteoarthritis of knee: Secondary | ICD-10-CM | POA: Diagnosis not present

## 2017-10-03 DIAGNOSIS — Z7984 Long term (current) use of oral hypoglycemic drugs: Secondary | ICD-10-CM | POA: Diagnosis not present

## 2017-10-03 DIAGNOSIS — Z885 Allergy status to narcotic agent status: Secondary | ICD-10-CM | POA: Insufficient documentation

## 2017-10-03 DIAGNOSIS — Z90710 Acquired absence of both cervix and uterus: Secondary | ICD-10-CM | POA: Diagnosis not present

## 2017-10-03 DIAGNOSIS — Z7951 Long term (current) use of inhaled steroids: Secondary | ICD-10-CM | POA: Insufficient documentation

## 2017-10-03 LAB — URINALYSIS, COMPLETE (UACMP) WITH MICROSCOPIC
BILIRUBIN URINE: NEGATIVE
GLUCOSE, UA: NEGATIVE mg/dL
HGB URINE DIPSTICK: NEGATIVE
KETONES UR: NEGATIVE mg/dL
Nitrite: NEGATIVE
PROTEIN: NEGATIVE mg/dL
Specific Gravity, Urine: >1.03 — ABNORMAL HIGH (ref 1.005–1.030)
pH: 5.5 (ref 5.0–8.0)

## 2017-10-03 LAB — WET PREP, GENITAL
Clue Cells Wet Prep HPF POC: NONE SEEN
Sperm: NONE SEEN
Trich, Wet Prep: NONE SEEN

## 2017-10-03 MED ORDER — FLUCONAZOLE 150 MG PO TABS: 150.0000 mg | ORAL_TABLET | Freq: Every day | ORAL | 0 refills | Status: DC

## 2017-10-03 NOTE — Discharge Instructions (Addendum)
Take medication as prescribed. Rest. Drink plenty of fluids.  Continue to follow-up with orthopedic and wear your wrist splint.  Follow up with your primary care physician this week as needed. Return to Urgent care for new or worsening concerns.

## 2017-10-03 NOTE — ED Provider Notes (Signed)
[facility] ____________________________________________  Time seen: Approximately 9:51 AM  I have reviewed the triage vital signs and the nursing notes.   HISTORY  Chief Complaint Dysuria and Wrist Pain  HPI Vanessa Fisher is a 57 y.o. female presenting for evaluation of some vaginal itching, vaginal irritation and some burning with urination present for the last several days.  Has tried some over-the-counter Monistat cream without much change.  States that she always has urinary frequency, but denies any real change of urinary frequency.  States she recently completed an antibiotic for sinus infection, and states that she feels like this may be a trigger for possible yeast infection.  Denies concerns of STDs.  Denies abdominal pain or change in chronic back pain.  No fevers.  Continues with right wrist pain has been going on for months to years in which she is following with orthopedic.  States pain is no worse than what she has been dull with.  Reports recent steroid injection.  Denies any recent fall, trauma or injury.  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  No increase of pain and no paresthesias or loss of range of motion.  Continues to eat and drink well.  Denies other aggravating or relieving factors.  Lorelee Market, MD: PCP   Past Medical History:  Diagnosis Date  . Asthma   . Bile acid malabsorption syndrome   . Depression   . Diabetes mellitus without complication ([place])   . Diverticulitis   . Gastric reflux   . Hand pain 05/12/2016    Patient Active Problem List   Diagnosis Date Noted  . Lumbar facet osteoarthritis 11/10/2016  . Chronic musculoskeletal pain 11/05/2016  . Lumbar radiculitis (Right) (L4) 09/28/2016  . Vitamin D insufficiency 07/30/2016  . Chronic pain syndrome 07/08/2016  . Long term (current) use of opiate analgesic 07/08/2016  . Long term prescription opiate use 07/08/2016    . Opiate use (30 MME/day) 07/08/2016  . Chronic low back pain (Primary Source of Pain) (midline) ( (Bilateral) (L>R) 07/08/2016  . Disturbance of skin sensation 07/08/2016  . Chronic neck pain (Secondary source of pain) (midline) (Bilateral) (L>R) 07/08/2016  . DDD (degenerative disc disease), cervical 07/08/2016  . DDD (degenerative disc disease), lumbar 07/08/2016  . Cervical spondylosis 07/08/2016  . Cervical radicular pain (Right) (C7/C8) 07/08/2016  . Upper extremity numbness (Right) 07/08/2016  . Cervical facet syndrome (Bilateral) (L>R) 07/08/2016  . Osteoarthritis of knee (Bilateral) (R>L) 07/08/2016  . Chronic lower extremity pain (Right) 07/08/2016  . Lumbar facet syndrome (Bilateral) (L>R) 07/08/2016  . Grade 1 Anterolisthesis of L4 over L5 (3 mm) 07/08/2016  . Grade 1 Retrolisthesis of L5 over S1 (5 mm) 07/08/2016  . Chronic knee pain [facility] source of pain) (Bilateral) (R>L) 05/12/2016    Past Surgical History:  Procedure Laterality Date  . PARTIAL HYSTERECTOMY    . TUBAL LIGATION       No current facility-administered medications for this encounter.   Current Outpatient Medications:  .  albuterol (PROVENTIL HFA;VENTOLIN HFA) 108 (90 BASE) MCG/ACT inhaler, Inhale 2 puffs into the lungs every 6 (six) hours as needed for wheezing or shortness of breath., Disp: , Rfl:  .  cetirizine (ZYRTEC) 10 MG tablet, Take 10 mg by mouth daily., Disp: , Rfl:  .  fluticasone (FLONASE) 50 MCG/ACT nasal spray, Place 1 spray into both nostrils daily., Disp: , Rfl:  .  lisinopril (PRINIVIL,ZESTRIL) 10 MG tablet, Take 10  mg by mouth daily., Disp: , Rfl: 3 .  meloxicam (MOBIC) 15 MG tablet, Take 1 tablet (15 mg total) by mouth daily., Disp: 5 tablet, Rfl: 0 .  pantoprazole (PROTONIX) 40 MG tablet, Take 40 mg by mouth daily., Disp: , Rfl:  .  fluconazole (DIFLUCAN) 150 MG tablet, Take 1 tablet (150 mg total) by mouth daily. Take one pill orally, then Repeat in 72hrs as needed., Disp: 2  tablet, Rfl: 0 .  glimepiride (AMARYL) 2 MG tablet, Take 2 mg by mouth daily., Disp: , Rfl: 2  Allergies Bc powder  [aspirin-salicylamide-caffeine]; Hydrocodone-acetaminophen; Penicillin g; and Penicillins  Family History  Problem Relation Age of Onset  . Diabetes Mother   . Hypertension Mother   . Heart failure Mother   . Cancer Mother   . Diabetes Father   . Hypertension Father     Social History Social History   Tobacco Use  . Smoking status: Never Smoker  . Smokeless tobacco: Never Used  Substance Use Topics  . Alcohol use: No  . Drug use: No    Review of Systems Constitutional: No fever/chills Cardiovascular: Denies chest pain. Respiratory: Denies shortness of breath. Gastrointestinal: No abdominal pain.  No nausea, no vomiting.  No diarrhea.  No constipation. Genitourinary: positive for dysuria. Skin: Negative for rash.  ____________________________________________   PHYSICAL EXAM:  VITAL SIGNS: ED Triage Vitals  Enc Vitals Group     BP 10/03/17 0909 (!) 123/54     Pulse Rate 10/03/17 0909 (!) 52     Resp 10/03/17 0909 18     Temp 10/03/17 0909 98 F (36.7 C)     Temp Source 10/03/17 0909 Oral     SpO2 10/03/17 0909 100 %     Weight 10/03/17 0906 215 lb (97.5 kg)     Height 10/03/17 0906 5\' 7"  (1.702 m)     Head Circumference --      Peak Flow --      Pain Score 10/03/17 0906 10     Pain Loc --      Pain Edu? --      Excl. in [place]? --     Constitutional: Alert and oriented. Well appearing and in no acute distress. ENT      Head: Normocephalic and atraumatic. Cardiovascular: Normal rate, regular rhythm. Grossly normal heart sounds.  Good peripheral circulation. Respiratory: Normal respiratory effort without tachypnea nor retractions. Breath sounds are clear and equal bilaterally. No wheezes, rales, rhonchi. Gastrointestinal: Minimal midline suprapubic tenderness to palpation.  Abdomen otherwise soft and nontender.  No CVA  tenderness. Musculoskeletal: Steady gait.  Except: Right proximal thumb along radial aspect of wrist mild diffuse tenderness to palpation, no point bony tenderness, full range of motion present, normal distal sensation and capillary refill. Neurologic:  Normal speech and language. Speech is normal. No gait instability.  Skin:  Skin is warm, dry.  Psychiatric: Mood and affect are normal. Speech and behavior are normal. Patient exhibits appropriate insight and judgment   ___________________________________________   LABS (all labs ordered are listed, but only abnormal results are displayed)  Labs Reviewed  WET PREP, GENITAL - Abnormal; Notable for the following components:      Result Value   Yeast Wet Prep HPF POC PRESENT (*)    WBC, Wet Prep HPF POC MODERATE (*)    All other components within normal limits  URINALYSIS, COMPLETE (UACMP) WITH MICROSCOPIC - Abnormal; Notable for the following components:   Specific Gravity, Urine >1.030 (*)  Leukocytes, UA MODERATE (*)    Bacteria, UA RARE (*)    All other components within normal limits  URINE CULTURE    RADIOLOGY  No results found. ____________________________________________   PROCEDURES Procedures    INITIAL IMPRESSION / ASSESSMENT AND PLAN / ED COURSE  Pertinent labs & imaging results that were available during my care of the patient were reviewed by me and considered in my medical decision making (see chart for details).  Well-appearing patient.  No acute distress.  Urinalysis reviewed, not clear UTI, will culture urine.  Will evaluate wet prep, patient elected for self wet prep.  Wet prep positive for yeast.  Will treat with Diflucan.  Encourage rest, fluids, supportive care and continue follow-up with orthopedic for radial tenosynovitis and supportive care.Discussed indication, risks and benefits of medications with patient.  Discussed follow up with Primary care physician this week. Discussed follow up and return  parameters including no resolution or any worsening concerns. Patient verbalized understanding and agreed to plan.   ____________________________________________   FINAL CLINICAL IMPRESSION(S) / ED DIAGNOSES  Final diagnoses:  Yeast vaginitis  Dysuria  Radial styloid tenosynovitis     ED Discharge Orders         Ordered    fluconazole (DIFLUCAN) 150 MG tablet  Daily     10/03/17 1010           Note: This dictation was prepared with Dragon dictation along with smaller phrase technology. Any transcriptional errors that result from this process are unintentional.         Marylene Land, NP 10/03/17 1021

## 2017-10-03 NOTE — ED Triage Notes (Signed)
Patient c/o dysuria, urinary frequency and vaginal itching that started 3 days ago. Denies vaginal discharge.  Patient also c/o right wrist pain that started several years ago. Patient stated she has carpal tunnel and had an injection in her wrist 4-5 weeks ago.

## 2017-10-05 LAB — URINE CULTURE

## 2017-11-26 ENCOUNTER — Other Ambulatory Visit: Payer: Self-pay | Admitting: Specialist

## 2017-11-29 ENCOUNTER — Encounter
Admission: RE | Admit: 2017-11-29 | Discharge: 2017-11-29 | Disposition: A | Discharge status: Home / Self Care | Payer: Medicare Other | Source: Ambulatory Visit | Attending: Specialist | Admitting: Specialist

## 2017-11-29 ENCOUNTER — Other Ambulatory Visit: Payer: Self-pay

## 2017-11-29 HISTORY — DX: Headache, unspecified: R51.9

## 2017-11-29 HISTORY — DX: Cervicalgia: M54.2

## 2017-11-29 HISTORY — DX: Panic disorder (episodic paroxysmal anxiety): F41.0

## 2017-11-29 HISTORY — DX: Bradycardia, unspecified: R00.1

## 2017-11-29 HISTORY — DX: Personal history of urinary calculi: Z87.442

## 2017-11-29 HISTORY — DX: Unspecified osteoarthritis, unspecified site: M19.90

## 2017-11-29 HISTORY — DX: Anemia, unspecified: D64.9

## 2017-11-29 HISTORY — DX: Anxiety disorder, unspecified: F41.9

## 2017-11-29 HISTORY — DX: Gastro-esophageal reflux disease without esophagitis: K21.9

## 2017-11-29 HISTORY — DX: Other chronic pain: G89.29

## 2017-11-29 HISTORY — DX: Headache: R51

## 2017-11-29 NOTE — Patient Instructions (Signed)
Your procedure is scheduled on: 12-06-17 MONDAY Report to Same Day Surgery 2nd floor medical mall [facility] Entrance-take elevator on left to 2nd floor.  Check in with surgery information desk.) To find out your arrival time please call [phone] between 1PM - 3PM on 12-03-17 FRIDAY  Remember: Instructions that are not followed completely may result in serious medical risk, up to and including death, or upon the discretion of your surgeon and anesthesiologist your surgery may need to be rescheduled.    _x___ 1. Do not eat food after midnight the night before your procedure. NO GUM OR CANDY AFTER MIDNIGHT.  You may drink WATER up to 2 hours before you are scheduled to arrive at the hospital for your procedure.  Do not drink WATER within 2 hours of your scheduled arrival to the hospital.  Type 1 and type 2 diabetics should only drink water.   ____Ensure clear carbohydrate drink on the way to the hospital for bariatric patients  ____Ensure clear carbohydrate drink 3 hours before surgery for Dr Dwyane Luo patients if physician instructed.     __x__ 2. No Alcohol for 24 hours before or after surgery.   __x__3. No Smoking or e-cigarettes for 24 prior to surgery.  Do not use any chewable tobacco products for at least 6 hour prior to surgery   ____  4. Bring all medications with you on the day of surgery if instructed.    __x__ 5. Notify your doctor if there is any change in your medical condition     (cold, fever, infections).    x___6. On the morning of surgery brush your teeth with toothpaste and water.  You may rinse your mouth with mouth wash if you wish.  Do not swallow any toothpaste or mouthwash.   Do not wear jewelry, make-up, hairpins, clips or nail polish.  Do not wear lotions, powders, or perfumes. You may wear deodorant.  Do not shave 48 hours prior to surgery. Men may shave face and neck.  Do not bring valuables to the hospital.    [facility] is not responsible for any  belongings or valuables.               Contacts, dentures or bridgework may not be worn into surgery.  Leave your suitcase in the car. After surgery it may be brought to your room.  For patients admitted to the hospital, discharge time is determined by your treatment team.  _  Patients discharged the day of surgery will not be allowed to drive home.  You will need someone to drive you home and stay with you the night of your procedure.    Please read over the following fact sheets that you were given:   [facility] Preparing for Surgery and or MRSA Information   _x___ TAKE THE FOLLOWING MEDICATION THE MORNING OF SURGERY WITH A SMALL SIP OF WATER. These include:  1. PROTONIX  2. TAKE AN EXTRA PROTONIX THE NIGHT BEFORE YOUR SURGERY  3.  4.  5.  6.  ____Fleets enema or Magnesium Citrate as directed.   _x___ Use CHG Soap or sage wipes as directed on instruction sheet   _X___ Use inhalers on the day of surgery and bring to hospital day of surgery-USE YOUR ALBUTEROL INHALER DAY OF SURGERY AND BRING TO [place]  ____ Stop Metformin and Janumet 2 days prior to surgery.    ____ Take 1/2 of usual insulin dose the night before surgery and none on the morning  surgery.   ____ Follow recommendations from Cardiologist, Pulmonologist or PCP regarding stopping Aspirin, Coumadin, Plavix ,Eliquis, Effient, or Pradaxa, and Pletal.  X____Stop Anti-inflammatories such as Advil, Aleve, Ibuprofen, Motrin, Naproxen, Naprosyn, Goodies powders or aspirin products NOW-OK to take Tylenol    ____ Stop supplements until after surgery.    ____ Bring C-Pap to the hospital.

## 2017-12-02 ENCOUNTER — Encounter
Admission: RE | Admit: 2017-12-02 | Discharge: 2017-12-02 | Disposition: A | Discharge status: Home / Self Care | Payer: Medicare Other | Source: Ambulatory Visit | Attending: Specialist | Admitting: Specialist

## 2017-12-02 DIAGNOSIS — Z01818 Encounter for other preprocedural examination: Secondary | ICD-10-CM | POA: Diagnosis present

## 2017-12-02 LAB — BASIC METABOLIC PANEL
ANION GAP: 6 (ref 5–15)
BUN: 15 mg/dL (ref 6–20)
CO2: 27 mmol/L (ref 22–32)
Calcium: 8.8 mg/dL — ABNORMAL LOW (ref 8.9–10.3)
Chloride: 106 mmol/L (ref 98–111)
Creatinine, Ser: 0.83 mg/dL (ref 0.44–1.00)
GFR calc Af Amer: >60 mL/min (ref >60)
Glucose, Bld: 88 mg/dL (ref 70–99)
Potassium: 3.9 mmol/L (ref 3.5–5.1)
SODIUM: 139 mmol/L (ref 135–145)

## 2017-12-02 LAB — CBC
HCT: 38.3 % (ref 36.0–46.0)
HEMOGLOBIN: 11.8 g/dL — AB (ref 12.0–15.0)
MCH: 25.3 pg — ABNORMAL LOW (ref 26.0–34.0)
MCHC: 30.8 g/dL (ref 30.0–36.0)
MCV: 82.2 fL (ref 80.0–100.0)
PLATELETS: 301 10*3/uL (ref 150–400)
RBC: 4.66 MIL/uL (ref 3.87–5.11)
RDW: 14.3 % (ref 11.5–15.5)
WBC: 6.2 10*3/uL (ref 4.0–10.5)
nRBC: 0 % (ref 0.0–0.2)

## 2017-12-05 MED ORDER — CLINDAMYCIN PHOSPHATE 600 MG/50ML IV SOLN
600.0000 mg | INTRAVENOUS | Status: AC
  Administered 2017-12-06: 600 mg via INTRAVENOUS

## 2017-12-06 ENCOUNTER — Ambulatory Visit
Admission: RE | Admit: 2017-12-06 | Discharge: 2017-12-06 | Disposition: A | Discharge status: Home / Self Care | Payer: Medicare Other | Source: Ambulatory Visit | Attending: Specialist | Admitting: Specialist

## 2017-12-06 ENCOUNTER — Other Ambulatory Visit: Payer: Self-pay

## 2017-12-06 ENCOUNTER — Encounter
Admission: RE | Disposition: A | Discharge status: Home / Self Care | Payer: Self-pay | Source: Ambulatory Visit | Attending: Specialist

## 2017-12-06 ENCOUNTER — Ambulatory Visit: Payer: Medicare Other | Admitting: Certified Registered Nurse Anesthetist

## 2017-12-06 DIAGNOSIS — F419 Anxiety disorder, unspecified: Secondary | ICD-10-CM | POA: Diagnosis not present

## 2017-12-06 DIAGNOSIS — K219 Gastro-esophageal reflux disease without esophagitis: Secondary | ICD-10-CM | POA: Insufficient documentation

## 2017-12-06 DIAGNOSIS — E3451 Complete androgen insensitivity syndrome: Secondary | ICD-10-CM | POA: Diagnosis not present

## 2017-12-06 DIAGNOSIS — E119 Type 2 diabetes mellitus without complications: Secondary | ICD-10-CM | POA: Insufficient documentation

## 2017-12-06 DIAGNOSIS — G5601 Carpal tunnel syndrome, right upper limb: Secondary | ICD-10-CM | POA: Insufficient documentation

## 2017-12-06 DIAGNOSIS — Z7984 Long term (current) use of oral hypoglycemic drugs: Secondary | ICD-10-CM | POA: Insufficient documentation

## 2017-12-06 DIAGNOSIS — F329 Major depressive disorder, single episode, unspecified: Secondary | ICD-10-CM | POA: Diagnosis not present

## 2017-12-06 DIAGNOSIS — Z79899 Other long term (current) drug therapy: Secondary | ICD-10-CM | POA: Insufficient documentation

## 2017-12-06 HISTORY — PX: DORSAL COMPARTMENT RELEASE: SHX5039

## 2017-12-06 HISTORY — PX: CARPAL TUNNEL RELEASE: SHX101

## 2017-12-06 LAB — GLUCOSE, CAPILLARY
GLUCOSE-CAPILLARY: 104 mg/dL — AB (ref 70–99)
GLUCOSE-CAPILLARY: 86 mg/dL (ref 70–99)

## 2017-12-06 SURGERY — CARPAL TUNNEL RELEASE
Anesthesia: General | Laterality: Right

## 2017-12-06 MED ORDER — FENTANYL CITRATE (PF) 100 MCG/2ML IJ SOLN
INTRAMUSCULAR | Status: AC
  Filled 2017-12-06: qty 2

## 2017-12-06 MED ORDER — BUPIVACAINE HCL (PF) 0.5 % IJ SOLN
INTRAMUSCULAR | Status: DC | PRN
  Administered 2017-12-06: 28 mL

## 2017-12-06 MED ORDER — LIDOCAINE HCL (PF) 2 % IJ SOLN
INTRAMUSCULAR | Status: AC
  Filled 2017-12-06: qty 10

## 2017-12-06 MED ORDER — MIDAZOLAM HCL 2 MG/2ML IJ SOLN
INTRAMUSCULAR | Status: DC | PRN
  Administered 2017-12-06: 2 mg via INTRAVENOUS

## 2017-12-06 MED ORDER — SUCCINYLCHOLINE CHLORIDE 20 MG/ML IJ SOLN
INTRAMUSCULAR | Status: AC
  Filled 2017-12-06: qty 1

## 2017-12-06 MED ORDER — MELOXICAM 7.5 MG PO TABS
15.0000 mg | ORAL_TABLET | ORAL | Status: AC
  Administered 2017-12-06: 15 mg via ORAL

## 2017-12-06 MED ORDER — TRAMADOL HCL 50 MG PO TABS: 50.0000 mg | ORAL_TABLET | Freq: Four times a day (QID) | ORAL | 2 refills | Status: DC | PRN

## 2017-12-06 MED ORDER — ONDANSETRON HCL 4 MG/2ML IJ SOLN
INTRAMUSCULAR | Status: DC | PRN
  Administered 2017-12-06: 4 mg via INTRAVENOUS

## 2017-12-06 MED ORDER — PHENYLEPHRINE HCL 10 MG/ML IJ SOLN
INTRAMUSCULAR | Status: AC
  Filled 2017-12-06: qty 1

## 2017-12-06 MED ORDER — PROPOFOL 10 MG/ML IV BOLUS
INTRAVENOUS | Status: DC | PRN
  Administered 2017-12-06: 200 mg via INTRAVENOUS

## 2017-12-06 MED ORDER — GLYCOPYRROLATE 0.2 MG/ML IJ SOLN
INTRAMUSCULAR | Status: AC
  Filled 2017-12-06: qty 1

## 2017-12-06 MED ORDER — FENTANYL CITRATE (PF) 100 MCG/2ML IJ SOLN: 25.0000 ug | INTRAMUSCULAR | Status: DC | PRN

## 2017-12-06 MED ORDER — DEXAMETHASONE SODIUM PHOSPHATE 10 MG/ML IJ SOLN
INTRAMUSCULAR | Status: DC | PRN
  Administered 2017-12-06: 4 mg via INTRAVENOUS

## 2017-12-06 MED ORDER — SODIUM CHLORIDE 0.9 % IV SOLN
INTRAVENOUS | Status: DC
  Administered 2017-12-06: 1000 mL via INTRAVENOUS

## 2017-12-06 MED ORDER — PROPOFOL 10 MG/ML IV BOLUS
INTRAVENOUS | Status: AC
  Filled 2017-12-06: qty 40

## 2017-12-06 MED ORDER — GABAPENTIN 400 MG PO CAPS: 400.0000 mg | ORAL_CAPSULE | Freq: Two times a day (BID) | ORAL | 3 refills | Status: DC

## 2017-12-06 MED ORDER — DEXAMETHASONE SODIUM PHOSPHATE 10 MG/ML IJ SOLN
INTRAMUSCULAR | Status: AC
  Filled 2017-12-06: qty 1

## 2017-12-06 MED ORDER — GLYCOPYRROLATE 0.2 MG/ML IJ SOLN
INTRAMUSCULAR | Status: DC | PRN
  Administered 2017-12-06: 0.2 mg via INTRAVENOUS

## 2017-12-06 MED ORDER — FENTANYL CITRATE (PF) 100 MCG/2ML IJ SOLN
INTRAMUSCULAR | Status: DC | PRN
  Administered 2017-12-06 (×4): 25 ug via INTRAVENOUS
  Administered 2017-12-06: 50 ug via INTRAVENOUS

## 2017-12-06 MED ORDER — GABAPENTIN 300 MG PO CAPS
300.0000 mg | ORAL_CAPSULE | ORAL | Status: AC
  Administered 2017-12-06: 300 mg via ORAL

## 2017-12-06 MED ORDER — ONDANSETRON HCL 4 MG/2ML IJ SOLN
INTRAMUSCULAR | Status: AC
  Filled 2017-12-06: qty 2

## 2017-12-06 MED ORDER — ACETAMINOPHEN 10 MG/ML IV SOLN
INTRAVENOUS | Status: DC | PRN
  Administered 2017-12-06: 1000 mg via INTRAVENOUS

## 2017-12-06 MED ORDER — PHENYLEPHRINE HCL 10 MG/ML IJ SOLN
INTRAMUSCULAR | Status: DC | PRN
  Administered 2017-12-06: 100 ug via INTRAVENOUS
  Administered 2017-12-06 (×2): 150 ug via INTRAVENOUS
  Administered 2017-12-06 (×4): 100 ug via INTRAVENOUS
  Administered 2017-12-06: 150 ug via INTRAVENOUS

## 2017-12-06 MED ORDER — LIDOCAINE HCL (CARDIAC) PF 100 MG/5ML IV SOSY
PREFILLED_SYRINGE | INTRAVENOUS | Status: DC | PRN
  Administered 2017-12-06: 100 mg via INTRAVENOUS

## 2017-12-06 MED ORDER — CHLORHEXIDINE GLUCONATE CLOTH 2 % EX PADS: 6.0000 | MEDICATED_PAD | Freq: Once | CUTANEOUS | Status: DC

## 2017-12-06 MED ORDER — ACETAMINOPHEN 10 MG/ML IV SOLN
INTRAVENOUS | Status: AC
  Filled 2017-12-06: qty 100

## 2017-12-06 MED ORDER — MIDAZOLAM HCL 2 MG/2ML IJ SOLN
INTRAMUSCULAR | Status: AC
  Filled 2017-12-06: qty 2

## 2017-12-06 SURGICAL SUPPLY — 37 items
BLADE SURG MINI STRL (BLADE) ×3 IMPLANT
BNDG ESMARK 4X12 TAN STRL LF (GAUZE/BANDAGES/DRESSINGS) ×3 IMPLANT
CANISTER SUCT 1200ML W/VALVE (MISCELLANEOUS) ×3 IMPLANT
CHLORAPREP W/TINT 26ML (MISCELLANEOUS) ×3 IMPLANT
COVER WAND RF STERILE (DRAPES) ×3 IMPLANT
CUFF TOURN 18 STER (MISCELLANEOUS) ×3 IMPLANT
CUFF TOURN SGL QUICK 24 (TOURNIQUET CUFF) ×2
CUFF TRNQT CYL 24X4X40X1 (TOURNIQUET CUFF) ×1 IMPLANT
DRSG GAUZE FLUFF 36X18 (GAUZE/BANDAGES/DRESSINGS) ×3 IMPLANT
ELECT REM PT RETURN 9FT ADLT (ELECTROSURGICAL) ×3
ELECTRODE REM PT RTRN 9FT ADLT (ELECTROSURGICAL) ×1 IMPLANT
GAUZE PETRO XEROFOAM 1X8 (MISCELLANEOUS) ×3 IMPLANT
GAUZE SPONGE 4X4 12PLY STRL (GAUZE/BANDAGES/DRESSINGS) ×3 IMPLANT
GLOVE BIO SURGEON STRL SZ8 (GLOVE) ×3 IMPLANT
GLOVE SURG ORTHO 8.0 STRL STRW (GLOVE) ×3 IMPLANT
GOWN STRL REUS W/ TWL LRG LVL3 (GOWN DISPOSABLE) ×1 IMPLANT
GOWN STRL REUS W/TWL LRG LVL3 (GOWN DISPOSABLE) ×2
GOWN STRL REUS W/TWL LRG LVL4 (GOWN DISPOSABLE) ×3 IMPLANT
KIT TURNOVER KIT A (KITS) ×3 IMPLANT
NS IRRIG 500ML POUR BTL (IV SOLUTION) ×3 IMPLANT
PACK EXTREMITY ARMC (MISCELLANEOUS) ×3 IMPLANT
PAD CAST CTTN 4X4 STRL (SOFTGOODS) ×1 IMPLANT
PAD PREP 24X41 OB/GYN DISP (PERSONAL CARE ITEMS) ×3 IMPLANT
PADDING CAST 4IN STRL (MISCELLANEOUS) ×2
PADDING CAST BLEND 4X4 STRL (MISCELLANEOUS) ×1 IMPLANT
PADDING CAST COTTON 4X4 STRL (SOFTGOODS) ×2
SPLINT CAST 1 STEP 3X12 (MISCELLANEOUS) ×3 IMPLANT
STOCKINETTE BIAS CUT 4 980044 (GAUZE/BANDAGES/DRESSINGS) ×3 IMPLANT
STOCKINETTE STRL 4IN 9604848 (GAUZE/BANDAGES/DRESSINGS) ×3 IMPLANT
SUT ETHILON 4-0 (SUTURE) ×2
SUT ETHILON 4-0 FS2 18XMFL BLK (SUTURE) ×1
SUT ETHILON 5-0 (SUTURE) ×2
SUT ETHILON 5-0 C-3 18XMFL BLK (SUTURE) ×1
SUT ETHILON 5-0 FS-2 18 BLK (SUTURE) ×3 IMPLANT
SUT VIC AB 4-0 FS2 27 (SUTURE) ×3 IMPLANT
SUTURE ETHLN 4-0 FS2 18XMF BLK (SUTURE) ×1 IMPLANT
SUTURE ETHLN 5-0 C3 18XMF BLK (SUTURE) ×1 IMPLANT

## 2017-12-06 NOTE — H&P (Signed)
THE PATIENT WAS SEEN PRIOR TO SURGERY TODAY.  HISTORY, ALLERGIES, HOME MEDICATIONS AND OPERATIVE PROCEDURE WERE REVIEWED. RISKS AND BENEFITS OF SURGERY DISCUSSED WITH PATIENT AGAIN.  NO CHANGES FROM INITIAL HISTORY AND PHYSICAL NOTED.    

## 2017-12-06 NOTE — Anesthesia Preprocedure Evaluation (Signed)
Anesthesia Evaluation  Patient identified by MRN, date of birth, ID band Patient awake    Reviewed: Allergy & Precautions, H&P , NPO status , Patient's Chart, lab work & pertinent test results  History of Anesthesia Complications Negative for: history of anesthetic complications  Airway Mallampati: II  TM Distance: >3 FB Neck ROM: full    Dental  (+) Edentulous Upper, Edentulous Lower   Pulmonary neg shortness of breath, asthma ,           Cardiovascular Exercise Tolerance: Good (-) angina(-) Past MI and (-) DOE negative cardio ROS       Neuro/Psych  Headaches, PSYCHIATRIC DISORDERS  Neuromuscular disease negative psych ROS   GI/Hepatic Neg liver ROS, GERD  Medicated and Controlled,  Endo/Other  diabetes, Type 2  Renal/GU      Musculoskeletal  (+) Arthritis ,   Abdominal   Peds  Hematology negative hematology ROS (+)   Anesthesia Other Findings Past Medical History: No date: Anemia No date: Anxiety No date: Arthritis No date: Asthma     Comment:  WELL CONTROLLED No date: Bile acid malabsorption syndrome No date: Bradycardia     Comment:  HAD AN ISSUE WITH THIS IN 2016-NO PROBLEMS SINCE No date: Depression No date: Diabetes mellitus without complication (HCC) No date: Diverticulitis No date: Gastric reflux No date: GERD (gastroesophageal reflux disease) 05/12/2016: Hand pain No date: Headache     Comment:  H/O MIGRAINES No date: History of kidney stones     Comment:  H/O No date: Neck pain, chronic No date: Panic attacks  Past Surgical History: No date: PARTIAL HYSTERECTOMY No date: TUBAL LIGATION  BMI    Body Mass Index:  33.84 kg/m      Reproductive/Obstetrics negative OB ROS                             Anesthesia Physical Anesthesia Plan  ASA: III  Anesthesia Plan: General LMA   Post-op Pain Management:    Induction: Intravenous  PONV Risk Score and Plan:  Ondansetron, Dexamethasone and Midazolam  Airway Management Planned: LMA  Additional Equipment:   Intra-op Plan:   Post-operative Plan: Extubation in OR  Informed Consent: I have reviewed the patients History and Physical, chart, labs and discussed the procedure including the risks, benefits and alternatives for the proposed anesthesia with the patient or authorized representative who has indicated his/her understanding and acceptance.   Dental Advisory Given  Plan Discussed with: Anesthesiologist, CRNA and Surgeon  Anesthesia Plan Comments: (Patient consented for risks of anesthesia including but not limited to:  - adverse reactions to medications - damage to teeth, lips or other oral mucosa - sore throat or hoarseness - Damage to heart, brain, lungs or loss of life  Patient voiced understanding.)        Anesthesia Quick Evaluation

## 2017-12-06 NOTE — Anesthesia Postprocedure Evaluation (Signed)
Anesthesia Post Note  Patient: Vanessa Fisher  Procedure(s) Performed: CARPAL TUNNEL RELEASE (Right ) RELEASE DORSAL COMPARTMENT (DEQUERVAIN) (Right )  Patient location during evaluation: PACU Anesthesia Type: General Level of consciousness: awake and alert Pain management: pain level controlled Vital Signs Assessment: post-procedure vital signs reviewed and stable Respiratory status: spontaneous breathing, nonlabored ventilation, respiratory function stable and patient connected to nasal cannula oxygen Cardiovascular status: blood pressure returned to baseline and stable Postop Assessment: no apparent nausea or vomiting Anesthetic complications: no     Last Vitals:  Vitals:   12/06/17 0955 12/06/17 1013  BP: (!) 112/46 (!) 112/59  Pulse: (!) 58 (!) 52  Resp: 16 16  Temp: (!) 36 C (!) 36.1 C  SpO2: 99% 100%    Last Pain:  Vitals:   12/06/17 1013  TempSrc: Temporal  PainSc: 0-No pain                 Precious Haws Cyle Kenyon

## 2017-12-06 NOTE — Transfer of Care (Signed)
Immediate Anesthesia Transfer of Care Note  Patient: Vanessa Fisher  Procedure(s) Performed: CARPAL TUNNEL RELEASE (Right ) RELEASE DORSAL COMPARTMENT (DEQUERVAIN) (Right )  Patient Location: PACU  Anesthesia Type:General  Level of Consciousness: drowsy  Airway & Oxygen Therapy: Patient Spontanous Breathing and Patient connected to face mask oxygen  Post-op Assessment: Report given to RN and Post -op Vital signs reviewed and stable  Post vital signs: Reviewed and stable  Last Vitals:  Vitals Value Taken Time  BP    Temp    Pulse 80 12/06/2017  9:03 AM  Resp 11 12/06/2017  9:03 AM  SpO2 100 % 12/06/2017  9:03 AM  Vitals shown include unvalidated device data.  Last Pain:  Vitals:   12/06/17 0620  TempSrc: Tympanic  PainSc: 0-No pain         Complications: No apparent anesthesia complications

## 2017-12-06 NOTE — Discharge Instructions (Signed)

## 2017-12-06 NOTE — Anesthesia Procedure Notes (Signed)
Procedure Name: LMA Insertion Performed by: Carleta Woodrow, CRNA Pre-anesthesia Checklist: Patient identified, Patient being monitored, Timeout performed, Emergency Drugs available and Suction available Patient Re-evaluated:Patient Re-evaluated prior to induction Oxygen Delivery Method: Circle system utilized Preoxygenation: Pre-oxygenation with 100% oxygen Induction Type: IV induction Ventilation: Mask ventilation without difficulty LMA: LMA inserted LMA Size: 3.5 Tube type: Oral Number of attempts: 1 Placement Confirmation: positive ETCO2 and breath sounds checked- equal and bilateral Tube secured with: Tape Dental Injury: Teeth and Oropharynx as per pre-operative assessment        

## 2017-12-06 NOTE — Op Note (Signed)
12/06/2017  8:57 AM  PATIENT:  Vanessa Fisher    PRE-OPERATIVE DIAGNOSIS: 1) RIGHT CARPAL TUNNEL SYNDROME  2) RIGHT DEQUERVAINS SYNDROME  POST-OPERATIVE DIAGNOSIS: 1) RIGHT CARPAL TUNNEL SYNDROME 2) RIGHT DEQUERVAINSSYNDROME  PROCEDURE:  1) RIGHT CARPAL TUNNEL RELEASE  2) RIGHT DEQUERVAINS RELEASE  SURGEON: Park Breed, MD    ANESTHESIA:   General  TOURNIQUET TIME: 48   MIN  PREOPERATIVE INDICATIONS:  Vanessa Fisher is a  57 y.o. female with a diagnosis of right carpal tunnel syndrome and right DeQuervains syndrome who failed conservative measures and elected for surgical management.    The risks benefits and alternatives were discussed with the patient preoperatively including but not limited to the risks of infection, bleeding, nerve injury, incomplete relief of symptoms, pillar pain, cardiopulmonary complications, the need for revision surgery, among others, and the patient was willing to proceed.  OPERATIVE FINDINGS: Thickened volar ligament and nerve compression.  Thickened 1st dorsal compartment with ganlion of sheath..  OPERATIVE PROCEDURE: The patient is brought to the operating room placed in the supine position. General anesthesia was administered. The right upper extremity was prepped and draped in usual sterile fashion. Time out was performed. The arm was elevated and exsanguinated and the tourniquet was inflated.Using loupe magnification, a  longitudinal incision was made over the 1st dorsal compartment and blunt dissection carried out. The radial nerve was identified fairly distally in the incision area and was carefully protected.  A large ganglion was seen over the styloid and was excised. The sheath was then incised proximally and distally carefully avoiding the nerve.  The tendons were frayed. One small slip was released from its own sheath. The wound was irrigated and closed with 4-0 vicryl and 4-0 nylon suture.  Next, an incision was made in line with the  radial border of the ring finger. The carpal tunnel transverse fascia was identified, cleaned, and incised sharply. The common sensory branches were visualized along with the superficial palmar arch and protected.  The median nerve was protected below  A Kelly clamp was  placed underneath the transverse carpal ligament, protecting the nerve. I released the ligament completely, and then released the proximal distal volar forearm fascia. The nerve was identified, and visualized, and protected throughout the case. The motor branch was intact upon inspection.  No masses or abnormalities were identified in ulnar bursa.  The wounds were irrigated copiously, and the skin closed with nylon. The wound was injected with 1/2% marcaine followed by a sterile dressing and  volar splint .  Tourniquet was deflated with good return of blood flow to all fingers. Sponge and needle counts were correct.  The patient tolerated this well, with no complications. The patient was awakened and taken to recovery in good condition.

## 2017-12-06 NOTE — Anesthesia Post-op Follow-up Note (Signed)
Anesthesia QCDR form completed.        

## 2018-02-17 ENCOUNTER — Ambulatory Visit
Admission: RE | Admit: 2018-02-17 | Discharge: 2018-02-17 | Disposition: A | Discharge status: Home / Self Care | Payer: Medicare Other | Source: Ambulatory Visit | Attending: Family Medicine | Admitting: Family Medicine

## 2018-02-17 ENCOUNTER — Other Ambulatory Visit: Payer: Self-pay | Admitting: Family Medicine

## 2018-02-17 DIAGNOSIS — J189 Pneumonia, unspecified organism: Secondary | ICD-10-CM | POA: Insufficient documentation

## 2018-06-24 ENCOUNTER — Ambulatory Visit (INDEPENDENT_AMBULATORY_CARE_PROVIDER_SITE_OTHER): Payer: Medicare Other | Admitting: Podiatry

## 2018-06-24 ENCOUNTER — Encounter: Payer: Self-pay | Admitting: Podiatry

## 2018-06-24 ENCOUNTER — Ambulatory Visit (INDEPENDENT_AMBULATORY_CARE_PROVIDER_SITE_OTHER): Payer: Medicare Other

## 2018-06-24 ENCOUNTER — Other Ambulatory Visit: Payer: Self-pay | Admitting: Podiatry

## 2018-06-24 ENCOUNTER — Other Ambulatory Visit: Payer: Self-pay

## 2018-06-24 VITALS — Temp 96.6°F

## 2018-06-24 DIAGNOSIS — M79671 Pain in right foot: Secondary | ICD-10-CM | POA: Diagnosis not present

## 2018-06-24 DIAGNOSIS — M722 Plantar fascial fibromatosis: Secondary | ICD-10-CM

## 2018-06-24 DIAGNOSIS — M76822 Posterior tibial tendinitis, left leg: Secondary | ICD-10-CM

## 2018-06-24 MED ORDER — METHYLPREDNISOLONE 4 MG PO TBPK: ORAL_TABLET | ORAL | 0 refills | Status: DC

## 2018-06-24 MED ORDER — MELOXICAM 15 MG PO TABS: 15.0000 mg | ORAL_TABLET | Freq: Every day | ORAL | 1 refills | Status: DC

## 2018-06-28 NOTE — Progress Notes (Signed)
    HPI: 58 year old female presenting today as a new patient with a chief complaint of intermittent sharp pain of the medial ankles bilaterally, left worse than right, that began 3-4 months ago. She states the pain radiates down into the left heel. She notes the pain is worse in the morning when she first gets out of bed, when she stands from a seated position or walks for long periods of time. She has been taking Ibuprofen, icing, applying Icy Hot, massaging and stretching the area, all with no significant relief. Patient is here for further evaluation and treatment.   Past Medical History:  Diagnosis Date  . Anemia   . Anxiety   . Arthritis   . Asthma    WELL CONTROLLED  . Bile acid malabsorption syndrome   . Bradycardia    HAD AN ISSUE WITH THIS IN 2016-NO PROBLEMS SINCE  . Depression   . Diabetes mellitus without complication ([place])   . Diverticulitis   . Gastric reflux   . GERD (gastroesophageal reflux disease)   . Hand pain 05/12/2016  . Headache    H/O MIGRAINES  . History of kidney stones    H/O  . Neck pain, chronic   . Panic attacks        Physical Exam: General: The patient is alert and oriented x3 in no acute distress.  Dermatology: Skin is warm, dry and supple bilateral lower extremities. Negative for open lesions or macerations.  Vascular: Palpable pedal pulses bilaterally. No edema or erythema noted. Capillary refill within normal limits.  Neurological: Epicritic and protective threshold grossly intact bilaterally.   Musculoskeletal Exam: Pain on palpation noted to the posterior tibial tendon of the left foot. Range of motion within normal limits. Muscle strength 5/5 in all muscle groups bilateral lower extremities.  Radiographic Exam:  Normal osseous mineralization. Joint spaces preserved. No fracture or dislocation identified.    Assessment: 1. Posterior tibial tendinitis left   Plan of Care:  1. Patient was evaluated. Radiographs were reviewed today.  2. Injection of 0.5 mL Celestone Soluspan injected into the posterior tibial tendon sheath.  3. Prescription for Medrol Dose Pak provided to patient. 4. Prescription for Meloxicam provided to patient. 5. CAM boot dispensed. Weightbearing as tolerated.  6. Return to clinic in 4 weeks.    Edrick Kins, DPM [facility]  Dr. Edrick Kins, [address]                                        [place], [place] [address]                Office [phone]  Fax [phone]

## 2018-07-22 ENCOUNTER — Ambulatory Visit (INDEPENDENT_AMBULATORY_CARE_PROVIDER_SITE_OTHER): Payer: Medicare Other | Admitting: Podiatry

## 2018-07-22 ENCOUNTER — Other Ambulatory Visit: Payer: Self-pay

## 2018-07-22 ENCOUNTER — Encounter: Payer: Self-pay | Admitting: Podiatry

## 2018-07-22 VITALS — Temp 97.1°F

## 2018-07-22 DIAGNOSIS — M66872 Spontaneous rupture of other tendons, left ankle and foot: Secondary | ICD-10-CM

## 2018-07-22 DIAGNOSIS — M76822 Posterior tibial tendinitis, left leg: Secondary | ICD-10-CM | POA: Diagnosis not present

## 2018-07-24 NOTE — Progress Notes (Signed)
    HPI: 58 year old female presenting today for follow up evaluation of left PT tendonitis. She states the pain is relatively unchanged. 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 She states she heard an audible "pop" at that time. She states the CAM boot is not helping. She has been taking Meloxicam with no significant relief. There are no worsening factors noted. Patient is here for further evaluation and treatment.   Past Medical History:  Diagnosis Date  . Anemia   . Anxiety   . Arthritis   . Asthma    WELL CONTROLLED  . Bile acid malabsorption syndrome   . Bradycardia    HAD AN ISSUE WITH THIS IN 2016-NO PROBLEMS SINCE  . Depression   . Diabetes mellitus without complication ([place])   . Diverticulitis   . Gastric reflux   . GERD (gastroesophageal reflux disease)   . Hand pain 05/12/2016  . Headache    H/O MIGRAINES  . History of kidney stones    H/O  . Neck pain, chronic   . Panic attacks        Physical Exam: General: The patient is alert and oriented x3 in no acute distress.  Dermatology: Skin is warm, dry and supple bilateral lower extremities. Negative for open lesions or macerations.  Vascular: Palpable pedal pulses bilaterally. No edema or erythema noted. Capillary refill within normal limits.  Neurological: Epicritic and protective threshold grossly intact bilaterally.   Musculoskeletal Exam: Pain on palpation noted to the posterior tibial tendon of the left foot. Range of motion within normal limits. Muscle strength 5/5 in all muscle groups bilateral lower extremities.  Assessment: 1. Posterior tibial tendinitis left 2. Possible PT tendon tear left    Plan of Care:  1. Patient was evaluated.  2. Injection of 0.5 mLs Celestone Soluspan injected into the posterior tibial tendon sheath of the left lower extremity.  3. Continue taking Meloxicam 15 mg.  4. Ankle brace dispensed. Discontinue using CAM boot  due to discomfort.  5. Order for MRI of the left ankle placed today.  6. Return to clinic in 3 weeks to review MRI results.   Works as a Neurosurgeon.    Edrick Kins, DPM [facility]  Dr. Edrick Kins, [address]                                        [place], [place] [address]                Office [phone]  Fax [phone]

## 2018-07-27 ENCOUNTER — Encounter: Payer: Self-pay | Admitting: Emergency Medicine

## 2018-07-27 ENCOUNTER — Ambulatory Visit
Admission: EM | Admit: 2018-07-27 | Discharge: 2018-07-27 | Disposition: A | Discharge status: Home / Self Care | Payer: Medicare Other | Attending: Family Medicine | Admitting: Family Medicine

## 2018-07-27 ENCOUNTER — Other Ambulatory Visit: Payer: Self-pay

## 2018-07-27 DIAGNOSIS — L237 Allergic contact dermatitis due to plants, except food: Secondary | ICD-10-CM | POA: Diagnosis not present

## 2018-07-27 DIAGNOSIS — R350 Frequency of micturition: Secondary | ICD-10-CM

## 2018-07-27 LAB — URINALYSIS, COMPLETE (UACMP) WITH MICROSCOPIC
Bacteria, UA: NONE SEEN
Bilirubin Urine: NEGATIVE
Glucose, UA: NEGATIVE mg/dL
Ketones, ur: NEGATIVE mg/dL
Leukocytes,Ua: NEGATIVE
Nitrite: NEGATIVE
Protein, ur: NEGATIVE mg/dL
Specific Gravity, Urine: 1.02 (ref 1.005–1.030)
WBC, UA: NONE SEEN WBC/hpf (ref 0–5)
pH: 6 (ref 5.0–8.0)

## 2018-07-27 MED ORDER — HYDROXYZINE HCL 25 MG PO TABS: 25.0000 mg | ORAL_TABLET | Freq: Three times a day (TID) | ORAL | 0 refills | Status: DC | PRN

## 2018-07-27 MED ORDER — PREDNISONE 20 MG PO TABS: 40.0000 mg | ORAL_TABLET | Freq: Every day | ORAL | 0 refills | Status: DC

## 2018-07-27 NOTE — ED Provider Notes (Signed)
Name: Vanessa Fisher DOB: [birth year] MRN: [id number] CSN: [id number] PCP: Lorelee Market, MD  Arrival date and time:  07/27/18 1624  Chief Complaint:  Urinary Tract Infection  NOTE: Prior to seeing the patient today, I have reviewed the triage nursing documentation and vital signs. Clinical staff has updated patient's PMH/PSHx, current medication list, and drug allergies/intolerances to ensure comprehensive history available to assist in medical decision making.   History:   HPI: Vanessa Fisher is a 58 y.o. female who presents today with complaints of urinary symptoms that began with acute onset 3 days ago. She complains of dysuria, frequency, and urgency. She has not appreciated any gross hematuria, nor has she noticed her urine being malodorous. Patient denies any associated nausea, vomiting, fever, and chills. She has not experienced any pain in her lower back, flank area, or abdomen. Patient notes that she does not have a significant history of recurrent urinary tract infections.   Additionally, patient complains of a rash to her BILATERAL upper and lower extremities, ankles, and feet. Rash that initially declared 3 days ago. She notes that it started on her arms and has progressively spread. Patient advising that she was working outside and inadvertently came into contact with poison oak/ivy. Rash is erythematous and pruritic. She has appreciated some clear drainage associated with the rash. There is no facial involvement; no rash to periorbital, paranasal, or perioral areas. Patient denies that she is experiencing any shortness of breath or sensation of pharyngeal/laryngeal fullness. She is not having difficulties swallowing.   In efforts to help reduce/relieve the associated pruritis, the patient advising that she has used oral and topical diphenhydramine, as well as calamine lotion, at home. Patient notes that conservative symptomatic management at home has "helped some".    Past Medical History:  Diagnosis Date   Anemia    Anxiety    Arthritis    Asthma    WELL CONTROLLED   Bile acid malabsorption syndrome    Bradycardia    HAD AN ISSUE WITH THIS IN 2016-NO PROBLEMS SINCE   Depression    Diabetes mellitus without complication (H[facility])    Diverticulitis    Gastric reflux    GERD (gastroesophageal reflux disease)    Hand pain 05/12/2016   Headache    H/O MIGRAINES   History of kidney stones    H/O   Neck pain, chronic    Panic attacks     Past Surgical History:  Procedure Laterality Date   CARPAL TUNNEL RELEASE Right 12/06/2017   Procedure: CARPAL TUNNEL RELEASE;  Surgeon: Earnestine Leys, MD;  Location: [facility] ORS;  Service: Orthopedics;  Laterality: Right;   DORSAL COMPARTMENT RELEASE Right 12/06/2017   Procedure: RELEASE DORSAL COMPARTMENT (DEQUERVAIN);  Surgeon: Earnestine Leys, MD;  Location: [facility] ORS;  Service: Orthopedics;  Laterality: Right;   PARTIAL HYSTERECTOMY     TUBAL LIGATION      Family History  Problem Relation Age of Onset   Diabetes Mother    Hypertension Mother    Heart failure Mother    Cancer Mother    Diabetes Father    Hypertension Father     Social History   Socioeconomic History   Marital status: Single    Spouse name: Not on file   Number of children: Not on file   Years of education: Not on file   Highest education level: Not on file  Occupational History   Not on file  Social Needs   Financial resource strain: Not on  file   Food insecurity    Worry: Not on file    Inability: Not on file   Transportation needs    Medical: Not on file    Non-medical: Not on file  Tobacco Use   Smoking status: Never Smoker   Smokeless tobacco: Never Used  Substance and Sexual Activity   Alcohol use: No   Drug use: No   Sexual activity: Not on file  Lifestyle   Physical activity    Days per week: Not on file    Minutes per session: Not on file   Stress: Not on file    Relationships   Social connections    Talks on phone: Not on file    Gets together: Not on file    Attends religious service: Not on file    Active member of club or organization: Not on file    Attends meetings of clubs or organizations: Not on file    Relationship status: Not on file   Intimate partner violence    Fear of current or ex partner: Not on file    Emotionally abused: Not on file    Physically abused: Not on file    Forced sexual activity: Not on file  Other Topics Concern   Not on file  Social History Narrative   Not on file    Patient Active Problem List   Diagnosis Date Noted   Carpal tunnel syndrome of right wrist 12/06/2017   Radial styloid tenosynovitis 08/11/2017   Lumbar facet osteoarthritis 11/10/2016   Chronic musculoskeletal pain 11/05/2016   Lumbar radiculitis (Right) (L4) 09/28/2016   Vitamin D insufficiency 07/30/2016   Chronic pain syndrome 07/08/2016   Long term (current) use of opiate analgesic 07/08/2016   Long term prescription opiate use 07/08/2016   Opiate use (30 MME/day) 07/08/2016   Chronic low back pain (Primary Source of Pain) (midline) ( (Bilateral) (L>R) 07/08/2016   Disturbance of skin sensation 07/08/2016   Chronic neck pain (Secondary source of pain) (midline) (Bilateral) (L>R) 07/08/2016   DDD (degenerative disc disease), cervical 07/08/2016   DDD (degenerative disc disease), lumbar 07/08/2016   Cervical spondylosis 07/08/2016   Cervical radicular pain (Right) (C7/C8) 07/08/2016   Upper extremity numbness (Right) 07/08/2016   Cervical facet syndrome (Bilateral) (L>R) 07/08/2016   Osteoarthritis of knee (Bilateral) (R>L) 07/08/2016   Chronic lower extremity pain (Right) 07/08/2016   Lumbar facet syndrome (Bilateral) (L>R) 07/08/2016   Grade 1 Anterolisthesis of L4 over L5 (3 mm) 07/08/2016   Grade 1 Retrolisthesis of L5 over S1 (5 mm) 07/08/2016   Chronic knee pain (Tertiary source of pain)  (Bilateral) (R>L) 05/12/2016    Home Medications:    Current Meds  Medication Sig   albuterol (PROVENTIL HFA;VENTOLIN HFA) 108 (90 BASE) MCG/ACT inhaler Inhale 2 puffs into the lungs every 6 (six) hours as needed for wheezing or shortness of breath.   atorvastatin (LIPITOR) 10 MG tablet Take 10 mg by mouth at bedtime.    fluticasone (FLONASE) 50 MCG/ACT nasal spray Place 2 sprays into both nostrils daily.    glimepiride (AMARYL) 2 MG tablet Take 2 mg by mouth daily.   lisinopril (PRINIVIL,ZESTRIL) 20 MG tablet Take 20 mg by mouth daily.   meloxicam (MOBIC) 15 MG tablet Take 1 tablet (15 mg total) by mouth daily.   pantoprazole (PROTONIX) 40 MG tablet Take 40 mg by mouth every morning.     Allergies:   Bc powder [aspirin-salicylamide-caffeine], Gabapentin, Hydrocodone-acetaminophen, Latex, Penicillin g, Penicillins, and Shrimp [  shellfish allergy]  Review of Systems (ROS): Review of Systems  Constitutional: Negative for chills and fever.  HENT: Negative for drooling, facial swelling, sore throat and trouble swallowing.   Respiratory: Positive for wheezing (PMH (+) asthma; stable and at baseline). Negative for cough and shortness of breath.   Cardiovascular: Negative for chest pain and palpitations.  Gastrointestinal: Negative for abdominal pain, nausea and vomiting.  Endocrine:       PMH (+) for diabetes  Genitourinary: Positive for dysuria, frequency and urgency. Negative for flank pain, hematuria, pelvic pain and vaginal pain.  Musculoskeletal: Negative for back pain.  Skin: Positive for color change and rash.  Neurological: Negative for dizziness, weakness, light-headedness and headaches.  Hematological: Negative for adenopathy.     Physical Exam:  Triage Vital Signs ED Triage Vitals  Enc Vitals Group     BP 07/27/18 1701 (!) 130/57     Pulse Rate 07/27/18 1701 62     Resp 07/27/18 1701 16     Temp 07/27/18 1701 98.7 F (37.1 C)     Temp Source 07/27/18 1701 Oral       SpO2 07/27/18 1701 99 %     Weight 07/27/18 1705 240 lb (108.9 kg)     Height 07/27/18 1705 5\' 7"  (1.702 m)     Head Circumference --      Peak Flow --      Pain Score 07/27/18 1705 9     Pain Loc --      Pain Edu? --      Excl. in [place]? --     Physical Exam  Constitutional: She is oriented to person, place, and time and well-developed, well-nourished, and in no distress.  HENT:  Head: Normocephalic and atraumatic.  Mouth/Throat: Oropharynx is clear and moist and mucous membranes are normal. No posterior oropharyngeal edema or posterior oropharyngeal erythema.  Eyes: Pupils are equal, round, and reactive to light. EOM are normal.  Neck: Normal range of motion. Neck supple. No tracheal deviation present.  Cardiovascular: Normal rate, regular rhythm, normal heart sounds and intact distal pulses. Exam reveals no gallop and no friction rub.  No murmur heard. Pulmonary/Chest: Effort normal and breath sounds normal. No respiratory distress. She has no wheezes. She has no rales.  Abdominal: Soft. Normal appearance and bowel sounds are normal. There is no abdominal tenderness. There is no CVA tenderness.  Neurological: She is alert and oriented to person, place, and time.  Skin: Skin is warm and dry. Rash noted. Rash is maculopapular (BILATERAL arms, legs, ankles, dorsal aspect of feet).  Slight clear drainage; intermittent. (+) erythematous and pruritic.  Psychiatric: Mood, affect and judgment normal.  Nursing note and vitals reviewed.    Urgent Care Treatments / Results:   LABS: PLEASE NOTE: all labs that were ordered this encounter are listed, however only abnormal results are displayed. Labs Reviewed  URINALYSIS, COMPLETE (UACMP) WITH MICROSCOPIC - Abnormal; Notable for the following components:      Result Value   Hgb urine dipstick TRACE (*)    All other components within normal limits    EKG: -None  RADIOLOGY: No results found.  PROCEDURES: Procedures  MEDICATIONS  RECEIVED THIS VISIT: Medications - No data to display  PERTINENT CLINICAL COURSE NOTES/UPDATES:   Initial Impression / Assessment and Plan / Urgent Care Course:    Vanessa Fisher is a 58 y.o. female who presents to [facility] today with complaints of Urinary Tract Infection   Pertinent labs & imaging  results that were available during my care of the patient were personally reviewed by me and considered in my medical decision making (see lab/imaging section of note for values and interpretations).  Patient overall well appearing and in no acute distress today in clinic. Exam reveals diffuse distributed maculopapular rash secondary to known exposure to poison oak/ivy. Rash worsening over last 3 days. Patient taking oral and topical H2 blocker (diphenhydramine) in addition to calamine lotion. While this has helped "some", patient notes that her symptoms are still significant. PMH (+) for T2DM; CBGs running in the 120s. Discussed abbreviated course of systemic steroids (prednisone 40 mg x 4 days). Patient recently on steroids x 3 weeks and did not experience any issues with glycemic control. Encouraged her to monitor sugars frequently while on steroids. For the itching, I will give her some hydroxyzine for PRN use. Advised not [place] use hydroxyzine and diphenhydramine (oral) concurrently. May continue topicals and lotions for symptomatic relief.   Regarding her urinary symptoms, her UA resulted as negative for infection. Her dysuria has been minimal per her report. The most distressing symptom is the frequency. Discussed potential causes as being related to her T2DM, age, and overall fluid intake. Discussed used of OTC Azo products as needed for urinary pain, however I feel the utility in her situation is likely to produce limited benefit for her.   Current clinical condition warrants patient being out of work in order to recover from her current injury/illness. She was provided with the  appropriate documentation to provide to her place of employment that will allow for her to RTW on 07/29/2018 with no restrictions.   Discussed follow up with primary care physician in 1 week for re-evaluation. I have reviewed the follow up and strict return precautions for any new or worsening symptoms. Patient is aware of symptoms that would be deemed urgent/emergent, and would thus require further evaluation either here or in the emergency department. At the time of discharge, she verbalized understanding and consent with the discharge plan as it was reviewed with her. All questions were fielded by provider and/or clinic staff prior to patient discharge.    Final Clinical Impressions(s) / Urgent Care Diagnoses:   Final diagnoses:  Contact dermatitis due to poison oak  Increased urinary frequency    New Prescriptions:   Meds ordered this encounter  Medications   hydrOXYzine (ATARAX/VISTARIL) 25 MG tablet    Sig: Take 1 tablet (25 mg total) by mouth 3 (three) times daily as needed for itching.    Dispense:  15 tablet    Refill:  0   predniSONE (DELTASONE) 20 MG tablet    Sig: Take 2 tablets (40 mg total) by mouth daily.    Dispense:  8 tablet    Refill:  0    Controlled Substance Prescriptions:  [place] Controlled Substance Registry consulted? Not Applicable  NOTE: This note was prepared using Dragon dictation software along with smaller phrase technology. Despite my best ability to proofread, there is the potential that transcriptional errors may still occur from this process, and are completely unintentional.     Karen Kitchens, NP 07/27/18 2304

## 2018-07-27 NOTE — ED Triage Notes (Signed)
Patient states she has poison ivy and UTI.

## 2018-07-27 NOTE — Discharge Instructions (Signed)
It was very nice seeing you today in clinic. Thank you for entrusting me with your care.   As discussed, your urine was negative. Your frequency could be related to your diabetes and fluid intake. May use over the counter Azo products as needed for dysuria.   For your rash, please utilize the medications that we discussed. Your prescriptions have been called in to your pharmacy. I sent in 4 days of steroids and some medication for your itching. Continue to use topical cream and lotion. DO NOT take Atarax (new prescription) and Benadryl together as they will BOTH make you sleepy.    Make arrangements to follow up with your regular doctor in 1 week for re-evaluation if not improving. If your symptoms/condition worsens, please seek follow up care either here or in the ER. Please remember, our [place] providers are "right here with you" when you need [place].   Again, it was my pleasure to take care of you today. Thank you for choosing our clinic. I hope that you start to feel better quickly.   [name], MSN, APRN, FNP-C, CEN Advanced Practice Provider [facility]

## 2018-08-09 ENCOUNTER — Other Ambulatory Visit: Payer: Self-pay

## 2018-08-09 ENCOUNTER — Ambulatory Visit
Admission: EM | Admit: 2018-08-09 | Discharge: 2018-08-09 | Disposition: A | Discharge status: Home / Self Care | Payer: Medicare Other | Attending: Urgent Care | Admitting: Urgent Care

## 2018-08-09 ENCOUNTER — Encounter: Payer: Self-pay | Admitting: Emergency Medicine

## 2018-08-09 DIAGNOSIS — W57XXXA Bitten or stung by nonvenomous insect and other nonvenomous arthropods, initial encounter: Secondary | ICD-10-CM

## 2018-08-09 DIAGNOSIS — L03116 Cellulitis of left lower limb: Secondary | ICD-10-CM

## 2018-08-09 MED ORDER — SULFAMETHOXAZOLE-TRIMETHOPRIM 800-160 MG PO TABS: 1.0000 | ORAL_TABLET | Freq: Two times a day (BID) | ORAL | 0 refills | Status: AC

## 2018-08-09 MED ORDER — TRIAMCINOLONE ACETONIDE 0.1 % EX CREA: 1.0000 "application " | TOPICAL_CREAM | Freq: Two times a day (BID) | CUTANEOUS | 0 refills | Status: DC | PRN

## 2018-08-09 NOTE — ED Triage Notes (Signed)
Pt c/o what she thinks is a spider bite on her left ankle. She states that it is swollen and she feels like something is in it. She noticed it about 2 weeks ago. No fever.

## 2018-08-09 NOTE — Discharge Instructions (Signed)
It was very nice seeing you today in clinic. Thank you for entrusting me with your care.   Please utilize the medications that we discussed. Your prescriptions have been called in to your pharmacy. Monitor for signs and symptoms of infection, which would included increased redness, swelling, streaking, drainage, pain, and the development of a fever.   Make arrangements to follow up with your regular doctor in 1 week for re-evaluation if not improving. If your symptoms/condition worsens, please seek follow up care either here or in the ER. Please remember, our [place] providers are "right here with you" when you need [place].   Again, it was my pleasure to take care of you today. Thank you for choosing our clinic. I hope that you start to feel better quickly.   [name], MSN, APRN, FNP-C, CEN Advanced Practice Provider [facility]

## 2018-08-09 NOTE — ED Provider Notes (Signed)
[place], [place]   Name: Vanessa Fisher DOB: 1960/10/16 MRN: [id number] CSN: [id number] PCP: Lorelee Market, MD  Arrival date and time:  08/09/18 1726  Chief Complaint:  Insect Bite   NOTE: Prior to seeing the patient today, I have reviewed the triage nursing documentation and vital signs. Clinical staff has updated patient's PMH/PSHx, current medication list, and drug allergies/intolerances to ensure comprehensive history available to assist in medical decision making.   History:   HPI: Vanessa Fisher is a 58 y.o. female who presents today with complaints of pain and swelling to the distal aspect of patient's LEFT lower leg. Patient thinks that she may have been bitten by a spider. Area was first appreciated approximately 2 weeks ago, however date of inciting injury unknown. Patient has been cleaning area daily with hydrogen peroxide and applying Neosporin.  Patient denies pain. She has not appreciated any associated drainage. Patient denies fevers. Additionally, patient has multiple insect bites to her BILATERAL upper extremities. She has been working outside a lot in her garden and thinks she is being bitten by Physiological scientist.   Past Medical History:  Diagnosis Date  . Anemia   . Anxiety   . Arthritis   . Asthma    WELL CONTROLLED  . Bile acid malabsorption syndrome   . Bradycardia    HAD AN ISSUE WITH THIS IN 2016-NO PROBLEMS SINCE  . Depression   . Diabetes mellitus without complication ([place])   . Diverticulitis   . Gastric reflux   . GERD (gastroesophageal reflux disease)   . Hand pain 05/12/2016  . Headache    H/O MIGRAINES  . History of kidney stones    H/O  . Neck pain, chronic   . Panic attacks     Past Surgical History:  Procedure Laterality Date  . CARPAL TUNNEL RELEASE Right 12/06/2017   Procedure: CARPAL TUNNEL RELEASE;  Surgeon: Earnestine Leys, MD;  Location: [facility] ORS;  Service: Orthopedics;  Laterality: Right;  . DORSAL COMPARTMENT RELEASE Right  12/06/2017   Procedure: RELEASE DORSAL COMPARTMENT (DEQUERVAIN);  Surgeon: Earnestine Leys, MD;  Location: [facility] ORS;  Service: Orthopedics;  Laterality: Right;  . PARTIAL HYSTERECTOMY    . TUBAL LIGATION      Family History  Problem Relation Age of Onset  . Diabetes Mother   . Hypertension Mother   . Heart failure Mother   . Cancer Mother   . Diabetes Father   . Hypertension Father     Social History   Tobacco Use  . Smoking status: Never Smoker  . Smokeless tobacco: Never Used  Substance Use Topics  . Alcohol use: No  . Drug use: No    Patient Active Problem List   Diagnosis Date Noted  . Carpal tunnel syndrome of right wrist 12/06/2017  . Radial styloid tenosynovitis 08/11/2017  . Lumbar facet osteoarthritis 11/10/2016  . Chronic musculoskeletal pain 11/05/2016  . Lumbar radiculitis (Right) (L4) 09/28/2016  . Vitamin D insufficiency 07/30/2016  . Chronic pain syndrome 07/08/2016  . Long term (current) use of opiate analgesic 07/08/2016  . Long term prescription opiate use 07/08/2016  . Opiate use (30 MME/day) 07/08/2016  . Chronic low back pain (Primary Source of Pain) (midline) ( (Bilateral) (L>R) 07/08/2016  . Disturbance of skin sensation 07/08/2016  . Chronic neck pain (Secondary source of pain) (midline) (Bilateral) (L>R) 07/08/2016  . DDD (degenerative disc disease), cervical 07/08/2016  . DDD (degenerative disc disease), lumbar 07/08/2016  . Cervical spondylosis 07/08/2016  . Cervical radicular  pain (Right) (C7/C8) 07/08/2016  . Upper extremity numbness (Right) 07/08/2016  . Cervical facet syndrome (Bilateral) (L>R) 07/08/2016  . Osteoarthritis of knee (Bilateral) (R>L) 07/08/2016  . Chronic lower extremity pain (Right) 07/08/2016  . Lumbar facet syndrome (Bilateral) (L>R) 07/08/2016  . Grade 1 Anterolisthesis of L4 over L5 (3 mm) 07/08/2016  . Grade 1 Retrolisthesis of L5 over S1 (5 mm) 07/08/2016  . Chronic knee pain [facility] source of pain) (Bilateral)  (R>L) 05/12/2016    Home Medications:    Current Meds  Medication Sig  . albuterol (PROVENTIL HFA;VENTOLIN HFA) 108 (90 BASE) MCG/ACT inhaler Inhale 2 puffs into the lungs every 6 (six) hours as needed for wheezing or shortness of breath.  Marland Kitchen atorvastatin (LIPITOR) 10 MG tablet Take 10 mg by mouth at bedtime.   . fluticasone (FLONASE) 50 MCG/ACT nasal spray Place 2 sprays into both nostrils daily.   Marland Kitchen glimepiride (AMARYL) 2 MG tablet Take 2 mg by mouth daily.  . hydrOXYzine (ATARAX/VISTARIL) 25 MG tablet Take 1 tablet (25 mg total) by mouth 3 (three) times daily as needed for itching.  Marland Kitchen ibuprofen (ADVIL) 800 MG tablet ibuprofen 800 mg tablet  Take 1 tablet 3 times a day by oral route.  Marland Kitchen lisinopril (PRINIVIL,ZESTRIL) 20 MG tablet Take 20 mg by mouth daily.  . meloxicam (MOBIC) 15 MG tablet Take 1 tablet (15 mg total) by mouth daily.    Allergies:   Bc powder [aspirin-salicylamide-caffeine], Gabapentin, Hydrocodone-acetaminophen, Latex, Penicillin g, Penicillins, and Shrimp [shellfish allergy]  Review of Systems (ROS): Review of Systems  Constitutional: Negative for chills and fever.  Respiratory: Negative for cough and shortness of breath.   Cardiovascular: Negative for chest pain and palpitations.  Skin: Positive for color change and wound.  Neurological: Negative for dizziness, weakness, numbness and headaches.  Hematological: Negative for adenopathy.     Vital Signs: Today's Vitals   08/09/18 1739 08/09/18 1742 08/09/18 1809  BP:  (!) 118/55   Pulse:  68   Resp:  18   Temp:  98.2 F (36.8 C)   TempSrc:  Oral   SpO2:  99%   Weight: 240 lb (108.9 kg)    Height: 5\' 7"  (1.702 m)    PainSc: 8   8     Physical Exam: Physical Exam  Constitutional: She is oriented to person, place, and time and well-developed, well-nourished, and in no distress.  HENT:  Head: Normocephalic and atraumatic.  Mouth/Throat: Mucous membranes are normal.  Neck: Normal range of motion. Neck  supple. No tracheal deviation present.  Cardiovascular: Normal rate, regular rhythm, normal heart sounds and intact distal pulses. Exam reveals no gallop and no friction rub.  No murmur heard. Pulmonary/Chest: Effort normal and breath sounds normal. No respiratory distress. She has no wheezes. She has no rales.  Musculoskeletal:       Feet:  Lymphadenopathy:    She has no cervical adenopathy.  Neurological: She is alert and oriented to person, place, and time. Gait normal. GCS score is 15.  Skin: Skin is warm and dry. No rash noted.  Multiple erythematous and pruritic insect bites noted scattered about patient's dorsal hands and BILATERAL upper extremities.   Psychiatric: Mood, memory, affect and judgment normal.  Nursing note and vitals reviewed.   Urgent Care Treatments / Results:   LABS: PLEASE NOTE: all labs that were ordered this encounter are listed, however only abnormal results are displayed. Labs Reviewed - No data to display  EKG: -None  RADIOLOGY: No results  found.  PROCEDURES: Procedures  MEDICATIONS RECEIVED THIS VISIT: Medications - No data to display  PERTINENT CLINICAL COURSE NOTES/UPDATES:   Initial Impression / Assessment and Plan / Urgent Care Course:  Pertinent labs & imaging results that were available during my care of the patient were personally reviewed by me and considered in my medical decision making (see lab/imaging section of note for values and interpretations).  EMILY MASSAR is a 58 y.o. female who presents to [facility] today with complaints of Insect Bite   Patient overall well appearing and in no acute distress today in clinic. Exam reveals scattered erythematous and pruritic insect bites to hands and upper extremities. Patient has been working outside and thinks she is being bitten by Physiological scientist. Patient notes some associated pain. Will treat with topical TAC. She was advised to avoid direct sun exposure for several hours  following application of the steroid cream. May use diphenhydramine oral or cream as needed for itching. Regarding the area to LEFT lower leg, it is warmth, tender, and red. She has no associated fevers. Area concerning for cellulitis secondary to an insect bite; possible spider per patient report. Patient is diabetic, which poses increased risk for slow healing wounds and development of significant wound infection. Will cover with a 5 day course of SMZ-TMP DS. Patient encouraged to keep area clean and dry. May continue TAO application. Monitor for signs and symptoms of infection, which would included increased redness, swelling, streaking, drainage, pain, and the development of a fever. May use Tylenol and/or Ibuprofen as needed for pain.   Discussed follow up with primary care physician in 1 week for re-evaluation. I have reviewed the follow up and strict return precautions for any new or worsening symptoms. Patient is aware of symptoms that would be deemed urgent/emergent, and would thus require further evaluation either here or in the emergency department. At the time of discharge, she verbalized understanding and consent with the discharge plan as it was reviewed with her. All questions were fielded by provider and/or clinic staff prior to patient discharge.    Final Clinical Impressions / Urgent Care Diagnoses:   Final diagnoses:  Cellulitis of left lower extremity  Multiple insect bites    New Prescriptions:  [place] Controlled Substance Registry consulted? Not Applicable  Meds ordered this encounter  Medications  . sulfamethoxazole-trimethoprim (BACTRIM DS) 800-160 MG tablet    Sig: Take 1 tablet by mouth 2 (two) times daily for 5 days.    Dispense:  10 tablet    Refill:  0  . triamcinolone cream (KENALOG) 0.1 %    Sig: Apply 1 application topically 2 (two) times daily as needed.    Dispense:  30 g    Refill:  0    Recommended Follow up Care:  Patient encouraged to follow up with the  following provider within the specified time frame, or sooner as dictated by the severity of her symptoms. As always, she was instructed that for any urgent/emergent care needs, she should seek care either here or in the emergency department for more immediate evaluation.  Follow-up Information    Lorelee Market, MD In 1 week.   Specialty: Family Medicine Why: General reassessment of symptoms if not improving Contact information: [place] [address] [phone]         NOTE: This note was prepared using Dragon dictation software along with smaller phrase technology. Despite my best ability to proofread, there is the potential that transcriptional errors may  still occur from this process, and are completely unintentional.     Karen Kitchens, NP 08/11/18 [phone]

## 2018-08-15 ENCOUNTER — Telehealth: Payer: Self-pay | Admitting: Podiatry

## 2018-08-15 NOTE — Telephone Encounter (Signed)
Pt is complaining of right ankle pain, would like some medication for inflammation. Also she is needing a work note to eliminate one of the days that she is working per week due to her pain and swelling

## 2018-08-26 ENCOUNTER — Telehealth: Payer: Self-pay

## 2018-08-26 ENCOUNTER — Other Ambulatory Visit: Payer: Self-pay | Admitting: Podiatry

## 2018-08-26 ENCOUNTER — Ambulatory Visit: Payer: Medicare Other | Admitting: Podiatry

## 2018-08-26 DIAGNOSIS — M76822 Posterior tibial tendinitis, left leg: Secondary | ICD-10-CM

## 2018-08-26 MED ORDER — OXYCODONE-ACETAMINOPHEN 5-325 MG PO TABS: 1.0000 | ORAL_TABLET | Freq: Three times a day (TID) | ORAL | 0 refills | Status: DC | PRN

## 2018-08-26 NOTE — Telephone Encounter (Signed)
-----   Message from Arlyce Dice sent at 08/26/2018  9:01 AM EDT ----- Vanessa Fisher is still having Foot Pain/ And Swelling Currently, would like to have something called in for this until she is able to be seen 09/09/18. Also needs to have MRI Scheduled as well.

## 2018-08-26 NOTE — Progress Notes (Signed)
PRN foot pain

## 2018-08-26 NOTE — Telephone Encounter (Signed)
Per Dr. Amalia Hailey, pain medication has been sent to pharmacy.  Patient notified via voice mail

## 2018-08-29 NOTE — Telephone Encounter (Signed)
Order for MRI has been entered in chart.  Unable to leave patient message because voice mail has not been set up yet.

## 2018-08-29 NOTE — Addendum Note (Signed)
Addended by: Graceann Congress D on: 08/29/2018 02:21 PM   Modules accepted: Orders

## 2018-09-04 ENCOUNTER — Other Ambulatory Visit: Payer: Self-pay

## 2018-09-04 ENCOUNTER — Ambulatory Visit
Admission: RE | Admit: 2018-09-04 | Discharge: 2018-09-04 | Disposition: A | Discharge status: Home / Self Care | Payer: Medicare Other | Source: Ambulatory Visit | Attending: Podiatry | Admitting: Podiatry

## 2018-09-04 DIAGNOSIS — M76822 Posterior tibial tendinitis, left leg: Secondary | ICD-10-CM | POA: Diagnosis present

## 2018-09-09 ENCOUNTER — Other Ambulatory Visit: Payer: Self-pay

## 2018-09-09 ENCOUNTER — Encounter: Payer: Self-pay | Admitting: Podiatry

## 2018-09-09 ENCOUNTER — Ambulatory Visit (INDEPENDENT_AMBULATORY_CARE_PROVIDER_SITE_OTHER): Payer: Medicare Other | Admitting: Podiatry

## 2018-09-09 DIAGNOSIS — M76822 Posterior tibial tendinitis, left leg: Secondary | ICD-10-CM

## 2018-09-09 MED ORDER — OXYCODONE-ACETAMINOPHEN 5-325 MG PO TABS: 1.0000 | ORAL_TABLET | Freq: Three times a day (TID) | ORAL | 0 refills | Status: DC | PRN

## 2018-09-09 NOTE — Patient Instructions (Signed)
Pre-Operative Instructions  Congratulations, you have decided to take an important step towards improving your quality of life.  You can be assured that the doctors and staff at [facility] will be with you every step of the way.  Here are some important things you should know:  1. Plan to be at the surgery center/hospital at least 1 (one) hour prior to your scheduled time, unless otherwise directed by the surgical center/hospital staff.  You must have a responsible adult accompany you, remain during the surgery and drive you home.  Make sure you have directions to the surgical center/hospital to ensure you arrive on time. 2. If you are having surgery at [facility] or [facility], you will need a copy of your medical history and physical form from your family physician within one month prior to the date of surgery. We will give you a form for your primary physician to complete.  3. We make every effort to accommodate the date you request for surgery.  However, there are times where surgery dates or times have to be moved.  We will contact you as soon as possible if a change in schedule is required.   4. No aspirin/ibuprofen for one week before surgery.  If you are on aspirin, any non-steroidal anti-inflammatory medications (Mobic, Aleve, Ibuprofen) should not be taken seven (7) days prior to your surgery.  You make take Tylenol for pain prior to surgery.  5. Medications - If you are taking daily heart and blood pressure medications, seizure, reflux, allergy, asthma, anxiety, pain or diabetes medications, make sure you notify the surgery center/hospital before the day of surgery so they can tell you which medications you should take or avoid the day of surgery. 6. No food or drink after midnight the night before surgery unless directed otherwise by surgical center/hospital staff. 7. No alcoholic beverages 24-hours prior to surgery.  No smoking 24-hours prior or 24-hours after  surgery. 8. Wear loose pants or shorts. They should be loose enough to fit over bandages, boots, and casts. 9. Don't wear slip-on shoes. Sneakers are preferred. 10. Bring your boot with you to the surgery center/hospital.  Also bring crutches or a walker if your physician has prescribed it for you.  If you do not have this equipment, it will be provided for you after surgery. 11. If you have not been contacted by the surgery center/hospital by the day before your surgery, call to confirm the date and time of your surgery. 12. Leave-time from work may vary depending on the type of surgery you have.  Appropriate arrangements should be made prior to surgery with your employer. 13. Prescriptions will be provided immediately following surgery by your doctor.  Fill these as soon as possible after surgery and take the medication as directed. Pain medications will not be refilled on weekends and must be approved by the doctor. 14. Remove nail polish on the operative foot and avoid getting pedicures prior to surgery. 15. Wash the night before surgery.  The night before surgery wash the foot and leg well with water and the antibacterial soap provided. Be sure to pay special attention to beneath the toenails and in between the toes.  Wash for at least three (3) minutes. Rinse thoroughly with water and dry well with a towel.  Perform this wash unless told not to do so by your physician.  Enclosed: 1 Ice pack (please put in freezer the night before surgery)   1 Hibiclens skin cleaner     Pre-op instructions  If you have any questions regarding the instructions, please do not hesitate to call our office.  [place]: [address], [place], [place] [address] -- [phone]  [place]: [address] [place], [place] [address] -- [phone]  [place]: [address]  [place], [place] [address] -- [phone]  [place]: [address], [address], [place],  [address] -- [phone]  Website:  [email or web address] 

## 2018-09-11 NOTE — Progress Notes (Signed)
    HPI: 58 year old female presenting today for follow up evaluation of left PT tendinitis. She reports continued pain and swelling. She has been taking Meloxicam and using the ankle brace as directed. Being on the foot increases the pain. She had an MRI done on 09/04/2018 and is here for the results. Patient is here for further evaluation and treatment.   Past Medical History:  Diagnosis Date  . Anemia   . Anxiety   . Arthritis   . Asthma    WELL CONTROLLED  . Bile acid malabsorption syndrome   . Bradycardia    HAD AN ISSUE WITH THIS IN 2016-NO PROBLEMS SINCE  . Depression   . Diabetes mellitus without complication ([place])   . Diverticulitis   . Gastric reflux   . GERD (gastroesophageal reflux disease)   . Hand pain 05/12/2016  . Headache    H/O MIGRAINES  . History of kidney stones    H/O  . Neck pain, chronic   . Panic attacks       Physical Exam: General: The patient is alert and oriented x3 in no acute distress.  Dermatology: Skin is warm, dry and supple bilateral lower extremities. Negative for open lesions or macerations.  Vascular: Palpable pedal pulses bilaterally. No edema or erythema noted. Capillary refill within normal limits.  Neurological: Epicritic and protective threshold grossly intact bilaterally.   Musculoskeletal Exam: Pain on palpation noted to the posterior tibial tendon of the left foot. Range of motion within normal limits. Muscle strength 5/5 in all muscle groups bilateral lower extremities.  MRI Impression:  1. Significant tendinopathy and tenosynovitis involving the posterior tibialis tendon as detailed above. Fairly extensive longitudinal split type tear but no complete tear/rupture. 2. MR findings consistent with plantar fasciitis. 3. Moderate distal Achilles tendinopathy. 4. Intact medial and lateral ankle ligaments and lateral ankle tendons. 5. Mild tibiotalar and subtalar degenerative changes but no stress fracture or AVN.  Assessment: 1.  Posterior tibial tendon tear left    Plan of Care:  1. Patient was evaluated. MRI reviewed.  2. Today we discussed the conservative versus surgical management of the presenting pathology. The patient opts for surgical management. All possible complications and details of the procedure were explained. All patient questions were answered. No guarantees were expressed or implied. 3. Authorization for surgery was initiated today. Surgery will consist of left PT tendon repair.  4. Prescription for Percocet 5/325 mg provided to patient.  5. Ace wraps provided.  6. Return to clinic one week post op.   Works as a Neurosurgeon.    Edrick Kins, DPM [facility]  Dr. Edrick Kins, [address]                                        [place], [place] [address]                Office [phone]  Fax [phone]

## 2018-09-13 ENCOUNTER — Telehealth: Payer: Self-pay | Admitting: *Deleted

## 2018-09-13 DIAGNOSIS — M76822 Posterior tibial tendinitis, left leg: Secondary | ICD-10-CM

## 2018-09-13 NOTE — Telephone Encounter (Signed)
"  I need to schedule my surgery."  Dr. Amalia Hailey does surgeries on Thursdays.  Do you have a date that you would like?  "I'd like to do it on August 20.  "That date is available.  I will get your surgery scheduled.  "Do you have a time?"  Someone from the surgical center will give you a call a day or two prior to your surgery date and will give you your arrival time.  If you have access to a computer, go ahead and register with the surgery center.  "Okay, I'll do that."  I sent a message to Jolee Ewing with [employer], requesting her assistance in getting the patient a knee scooter.

## 2018-09-15 ENCOUNTER — Telehealth: Payer: Self-pay | Admitting: *Deleted

## 2018-09-15 NOTE — Telephone Encounter (Signed)
"  Someone had texted me about the surgery that's going to be done on the twentieth but there was no time for the surgery.  Can you call me back and give me the time of the surgery?  I'd appreciate it."

## 2018-09-16 NOTE — Telephone Encounter (Signed)
I am returning your call.  Someone from the surgical center will call you a day or two prior to your surgery date and will give you your arrival time.  "This is her son.  She's not technologically capable of doing the things on the computer.  I'm not here all the time.  What do you do about that?"  Someone from the surgical center will call her and get the information that they need.  "Oh okay, that's all we needed to know."

## 2018-09-22 ENCOUNTER — Encounter: Payer: Self-pay | Admitting: Podiatry

## 2018-09-22 ENCOUNTER — Other Ambulatory Visit: Payer: Self-pay | Admitting: Podiatry

## 2018-09-22 DIAGNOSIS — M7662 Achilles tendinitis, left leg: Secondary | ICD-10-CM

## 2018-09-22 MED ORDER — OXYCODONE-ACETAMINOPHEN 5-325 MG PO TABS: 1.0000 | ORAL_TABLET | Freq: Three times a day (TID) | ORAL | 0 refills | Status: DC | PRN

## 2018-09-22 MED ORDER — OXYCODONE-ACETAMINOPHEN 5-325 MG PO TABS: 1.0000 | ORAL_TABLET | ORAL | 0 refills | Status: DC | PRN

## 2018-09-22 NOTE — Progress Notes (Signed)
.  postop

## 2018-09-23 ENCOUNTER — Telehealth: Payer: Self-pay | Admitting: Podiatry

## 2018-09-23 NOTE — Telephone Encounter (Signed)
Pt called about her pain medication and stated [facility] [phone] stated they didn't have her pain Rx. I told her it shows they received it at 5:15 pm yesterday. Pt stated she would call them back to verify if they do or do not have it.

## 2018-09-26 ENCOUNTER — Telehealth: Payer: Self-pay | Admitting: Podiatry

## 2018-09-26 NOTE — Telephone Encounter (Signed)
Pt had surgery on 09/22/18 and has bleeding from her surgery foot and is experiencing severe pain. Pt states she unable to put pressure on her foot and would like to know if the doctor can prescribe something stronger for her to take.   Please give patient a call.

## 2018-09-26 NOTE — Telephone Encounter (Signed)
I spoke with patient, she said that there was some blood on outside of bandage but it was dry to touch.  I informed her that dry blood was normal.  She also stated that the Percocet prescribed is not helping with the pain and requested something stronger.  FYI, She was a patient of the pain clinic.  Please advise if giving a stronger pain medication.

## 2018-09-30 ENCOUNTER — Ambulatory Visit (INDEPENDENT_AMBULATORY_CARE_PROVIDER_SITE_OTHER): Payer: Self-pay | Admitting: Podiatry

## 2018-09-30 ENCOUNTER — Other Ambulatory Visit: Payer: Self-pay

## 2018-09-30 ENCOUNTER — Encounter: Payer: Self-pay | Admitting: Podiatry

## 2018-09-30 VITALS — BP 145/78 | HR 59 | Temp 99.0°F

## 2018-09-30 DIAGNOSIS — M76822 Posterior tibial tendinitis, left leg: Secondary | ICD-10-CM

## 2018-09-30 DIAGNOSIS — Z9889 Other specified postprocedural states: Secondary | ICD-10-CM

## 2018-09-30 MED ORDER — DOXYCYCLINE HYCLATE 100 MG PO TABS: 100.0000 mg | ORAL_TABLET | Freq: Two times a day (BID) | ORAL | 0 refills | Status: DC

## 2018-09-30 MED ORDER — OXYCODONE-ACETAMINOPHEN 10-325 MG PO TABS: 1.0000 | ORAL_TABLET | Freq: Four times a day (QID) | ORAL | 0 refills | Status: AC | PRN

## 2018-10-03 NOTE — Progress Notes (Signed)
   Subjective:  Patient presents today status post PT tendon repair left. DOS: 09/22/2018. She reports a significant amount of pain. She has been taking the pain medications and states they are not helping. She denies any modifying factors. Patient is here for further evaluation and treatment.   Past Medical History:  Diagnosis Date  . Anemia   . Anxiety   . Arthritis   . Asthma    WELL CONTROLLED  . Bile acid malabsorption syndrome   . Bradycardia    HAD AN ISSUE WITH THIS IN 2016-NO PROBLEMS SINCE  . Depression   . Diabetes mellitus without complication ([place])   . Diverticulitis   . Gastric reflux   . GERD (gastroesophageal reflux disease)   . Hand pain 05/12/2016  . Headache    H/O MIGRAINES  . History of kidney stones    H/O  . Neck pain, chronic   . Panic attacks       Objective/Physical Exam Neurovascular status intact.  Skin incisions appear to be well coapted with sutures and staples intact. No sign of infectious process noted. No dehiscence. No active bleeding noted. Moderate edema noted to the surgical extremity.  Assessment: 1. s/p PT tendon repair left. DOS: 09/22/2018   Plan of Care:  1. Patient was evaluated. X-rays reviewed 2. Dressing changed.  3. Continue using CAM boot.  4. Prescription for Doxycycline 100 mg provided to patient.  5. Return to clinic in one week.   Works at a home health aid.    Edrick Kins, DPM [facility]  Dr. Edrick Kins, [address]                                        [place], [place] [address]                Office [phone]  Fax [phone]

## 2018-10-04 ENCOUNTER — Encounter: Payer: Self-pay | Admitting: Podiatry

## 2018-10-04 ENCOUNTER — Ambulatory Visit (INDEPENDENT_AMBULATORY_CARE_PROVIDER_SITE_OTHER): Payer: Medicare Other | Admitting: Podiatry

## 2018-10-04 ENCOUNTER — Other Ambulatory Visit: Payer: Self-pay

## 2018-10-04 DIAGNOSIS — M76822 Posterior tibial tendinitis, left leg: Secondary | ICD-10-CM

## 2018-10-04 DIAGNOSIS — Z9889 Other specified postprocedural states: Secondary | ICD-10-CM

## 2018-10-05 ENCOUNTER — Telehealth: Payer: Self-pay

## 2018-10-05 NOTE — Telephone Encounter (Signed)
-----   Message from Simone Curia sent at 10/05/2018  9:46 AM EDT ----- Regarding: pain meds Pt called and wanted to speak to you about her pain meds. She states that it was never called in and that she is in so much pain and can not walk. She has no more medication. Please call her @ [phone]. [facility]. Patient is unable to take Tylenol because of acid reflux?

## 2018-10-05 NOTE — Telephone Encounter (Signed)
Patient states she is hurting really bad today and ran out of pain medication.  Please send her pain medication refill to [facility].   Thanks

## 2018-10-06 NOTE — Progress Notes (Signed)
   Subjective:  Patient presents today status post posterior tibial tendon repair left. DOS: 09/22/2018. She reports some soreness of the foot. She has been using the CAM boot as directed but states it has been rubbing against the incision site causing pain. There are no other modifying factors noted. Patient is here for further evaluation and treatment.   Past Medical History:  Diagnosis Date  . Anemia   . Anxiety   . Arthritis   . Asthma    WELL CONTROLLED  . Bile acid malabsorption syndrome   . Bradycardia    HAD AN ISSUE WITH THIS IN 2016-NO PROBLEMS SINCE  . Depression   . Diabetes mellitus without complication ([place])   . Diverticulitis   . Gastric reflux   . GERD (gastroesophageal reflux disease)   . Hand pain 05/12/2016  . Headache    H/O MIGRAINES  . History of kidney stones    H/O  . Neck pain, chronic   . Panic attacks       Objective/Physical Exam Neurovascular status intact.  Skin incisions appear to be well coapted with sutures and staples intact. No sign of infectious process noted. No dehiscence. No active bleeding noted. Moderate edema noted to the surgical extremity.  Assessment: 1. s/p PT tendon repair left. DOS: 09/22/2018   Plan of Care:  1. Patient was evaluated.  2. Discontinue using CAM boot due to irritation.  3. Unna boot applied.  4. Post op shoe dispensed.  5. Return to clinic in one week for staple removal.     Works at a home health aid.    Edrick Kins, DPM [facility]  Dr. Edrick Kins, [place]                                        [place], [place] [address]                Office [phone]  Fax [phone]

## 2018-10-11 ENCOUNTER — Encounter: Payer: Self-pay | Admitting: Podiatry

## 2018-10-11 ENCOUNTER — Ambulatory Visit (INDEPENDENT_AMBULATORY_CARE_PROVIDER_SITE_OTHER): Payer: Medicare Other | Admitting: Podiatry

## 2018-10-11 ENCOUNTER — Other Ambulatory Visit: Payer: Self-pay

## 2018-10-11 DIAGNOSIS — Z9889 Other specified postprocedural states: Secondary | ICD-10-CM | POA: Diagnosis not present

## 2018-10-11 DIAGNOSIS — M76822 Posterior tibial tendinitis, left leg: Secondary | ICD-10-CM | POA: Diagnosis not present

## 2018-10-11 MED ORDER — OXYCODONE-ACETAMINOPHEN 5-325 MG PO TABS: 1.0000 | ORAL_TABLET | Freq: Four times a day (QID) | ORAL | 0 refills | Status: DC | PRN

## 2018-10-13 NOTE — Progress Notes (Signed)
   Subjective:  Patient presents today status post posterior tibial tendon repair left. DOS: 09/22/2018. She reports continued soreness of the medial ankle but states it has improved since her last appointment. She has been using the post op shoe as directed. She denies any modifying factors. Patient is here for further evaluation and treatment.   Past Medical History:  Diagnosis Date  . Anemia   . Anxiety   . Arthritis   . Asthma    WELL CONTROLLED  . Bile acid malabsorption syndrome   . Bradycardia    HAD AN ISSUE WITH THIS IN 2016-NO PROBLEMS SINCE  . Depression   . Diabetes mellitus without complication ([place])   . Diverticulitis   . Gastric reflux   . GERD (gastroesophageal reflux disease)   . Hand pain 05/12/2016  . Headache    H/O MIGRAINES  . History of kidney stones    H/O  . Neck pain, chronic   . Panic attacks       Objective/Physical Exam Neurovascular status intact.  Skin incisions appear to be well coapted with sutures and staples intact. No sign of infectious process noted. No dehiscence. No active bleeding noted. Moderate edema noted to the surgical extremity.  Assessment: 1. s/p PT tendon repair left. DOS: 09/22/2018   Plan of Care:  1. Patient was evaluated.  2. Staples removed.  3. Unna boot applied.  4. Refill prescription for Percocet 5/325 mg provided to patient.  5. Return to clinic in one week.     Works at a home health aid.    Edrick Kins, DPM [facility]  Dr. Edrick Kins, [place]                                        [place],  [address]                Office [phone]  Fax [phone]

## 2018-10-18 ENCOUNTER — Other Ambulatory Visit: Payer: Self-pay

## 2018-10-18 ENCOUNTER — Ambulatory Visit (INDEPENDENT_AMBULATORY_CARE_PROVIDER_SITE_OTHER): Payer: Medicare Other | Admitting: Podiatry

## 2018-10-18 ENCOUNTER — Encounter: Payer: Self-pay | Admitting: Podiatry

## 2018-10-18 DIAGNOSIS — M76822 Posterior tibial tendinitis, left leg: Secondary | ICD-10-CM

## 2018-10-18 DIAGNOSIS — Z9889 Other specified postprocedural states: Secondary | ICD-10-CM

## 2018-10-20 ENCOUNTER — Other Ambulatory Visit: Payer: Self-pay | Admitting: Podiatry

## 2018-10-20 MED ORDER — OXYCODONE-ACETAMINOPHEN 10-325 MG PO TABS: 1.0000 | ORAL_TABLET | Freq: Three times a day (TID) | ORAL | 0 refills | Status: AC | PRN

## 2018-10-20 NOTE — Progress Notes (Signed)
.  postop

## 2018-10-21 ENCOUNTER — Telehealth: Payer: Self-pay | Admitting: Podiatry

## 2018-10-21 NOTE — Telephone Encounter (Signed)
Pt stated [facility] [phone] to pick up her pain Rx and they told her they did not have it. I told her on our end it shows they received it yesterday, 09/17 at 10:10 am. Pt states she is in a lot of pain.

## 2018-10-21 NOTE — Progress Notes (Signed)
   Subjective:  Patient presents today status post posterior tibial tendon repair left. DOS: 09/22/2018. She states she is doing better and improving although she still experiences some mild intermittent soreness. She reports a decrease in the swelling. There are no modifying factors noted. She has been using the post op shoe as directed. Patient is here for further evaluation and treatment.   Past Medical History:  Diagnosis Date  . Anemia   . Anxiety   . Arthritis   . Asthma    WELL CONTROLLED  . Bile acid malabsorption syndrome   . Bradycardia    HAD AN ISSUE WITH THIS IN 2016-NO PROBLEMS SINCE  . Depression   . Diabetes mellitus without complication ([employer])   . Diverticulitis   . Gastric reflux   . GERD (gastroesophageal reflux disease)   . Hand pain 05/12/2016  . Headache    H/O MIGRAINES  . History of kidney stones    H/O  . Neck pain, chronic   . Panic attacks       Objective/Physical Exam Neurovascular status intact.  Skin incisions appear to be well coapted. No sign of infectious process noted. No dehiscence. No active bleeding noted. Moderate edema noted to the surgical extremity.  Assessment: 1. s/p PT tendon repair left. DOS: 09/22/2018   Plan of Care:  1. Patient was evaluated.  2. Compression anklet dispensed.  3. Continue using post op shoe.  4. Return to clinic in 4 weeks.      Works at a home health aid.    Edrick Kins, DPM [facility]  Dr. Edrick Kins, [place]                                        [place], [place] [address]                Office [phone]  Fax [phone]

## 2018-10-24 NOTE — Telephone Encounter (Signed)
Pharmacy was called to confirm receipt. Pharmacist stated that the patient picked up her prescription earlier in the day.

## 2018-10-28 ENCOUNTER — Ambulatory Visit (INDEPENDENT_AMBULATORY_CARE_PROVIDER_SITE_OTHER): Payer: Medicare Other | Admitting: Podiatry

## 2018-10-28 ENCOUNTER — Other Ambulatory Visit: Payer: Self-pay

## 2018-10-28 DIAGNOSIS — Z9889 Other specified postprocedural states: Secondary | ICD-10-CM

## 2018-10-28 DIAGNOSIS — M76822 Posterior tibial tendinitis, left leg: Secondary | ICD-10-CM

## 2018-10-28 MED ORDER — DOXYCYCLINE HYCLATE 100 MG PO TABS: 100.0000 mg | ORAL_TABLET | Freq: Two times a day (BID) | ORAL | 0 refills | Status: DC

## 2018-10-28 MED ORDER — OXYCODONE-ACETAMINOPHEN 5-325 MG PO TABS: 1.0000 | ORAL_TABLET | Freq: Four times a day (QID) | ORAL | 0 refills | Status: DC | PRN

## 2018-10-30 NOTE — Progress Notes (Signed)
   Subjective:  Patient presents today status post posterior tibial tendon repair left. DOS: 09/22/2018. She states she is doing well overall but is concerned for infection. She reports some drainage from the incision site that began about one week ago. Walking and standing increases the symptoms. She has been taking the pain medication for treatment. Patient is here for further evaluation and treatment.   Past Medical History:  Diagnosis Date  . Anemia   . Anxiety   . Arthritis   . Asthma    WELL CONTROLLED  . Bile acid malabsorption syndrome   . Bradycardia    HAD AN ISSUE WITH THIS IN 2016-NO PROBLEMS SINCE  . Depression   . Diabetes mellitus without complication ([place])   . Diverticulitis   . Gastric reflux   . GERD (gastroesophageal reflux disease)   . Hand pain 05/12/2016  . Headache    H/O MIGRAINES  . History of kidney stones    H/O  . Neck pain, chronic   . Panic attacks       Objective/Physical Exam Neurovascular status intact.  Skin incisions appear to be well coapted. No sign of infectious process noted. No dehiscence. No active bleeding noted. Moderate edema noted to the surgical extremity.  Assessment: 1. s/p PT tendon repair left. DOS: 09/22/2018   Plan of Care:  1. Patient was evaluated.  2. Prescription for Doxycycline provided to patient for prophylaxis.  3. Physical therapy ordered at [facility] physical therapy for one month.  4. Continue using compression anklet.  5. Continue using post op shoe.  6. Refill prescription for Percocet 5/325 mg provided to patient.  7. Return to clinic in 4 weeks.     Works at a home health aid.    Edrick Kins, DPM [facility]  Dr. Edrick Kins, [address]                                        [place], [place] [address]                Office [phone]  Fax [phone]

## 2018-11-04 ENCOUNTER — Telehealth: Payer: Self-pay | Admitting: Podiatry

## 2018-11-04 MED ORDER — OXYCODONE-ACETAMINOPHEN 5-325 MG PO TABS: 1.0000 | ORAL_TABLET | ORAL | 0 refills | Status: AC | PRN

## 2018-11-04 NOTE — Telephone Encounter (Signed)
Refill sent to pharmacy from Dr Posey Pronto

## 2018-11-04 NOTE — Addendum Note (Signed)
Addended by: Boneta Lucks on: 11/04/2018 03:44 PM   Modules accepted: Orders

## 2018-11-04 NOTE — Telephone Encounter (Signed)
Pt is requesting pain medication/ she called this morning to the [place] office.

## 2018-11-06 ENCOUNTER — Other Ambulatory Visit: Payer: Self-pay

## 2018-11-06 ENCOUNTER — Ambulatory Visit: Payer: Medicare Other

## 2018-11-06 ENCOUNTER — Encounter: Payer: Self-pay | Admitting: Emergency Medicine

## 2018-11-06 ENCOUNTER — Ambulatory Visit
Admission: EM | Admit: 2018-11-06 | Discharge: 2018-11-06 | Disposition: A | Discharge status: Home / Self Care | Payer: Medicare Other | Attending: Emergency Medicine | Admitting: Emergency Medicine

## 2018-11-06 DIAGNOSIS — M7532 Calcific tendinitis of left shoulder: Secondary | ICD-10-CM | POA: Diagnosis present

## 2018-11-06 MED ORDER — METHYLPREDNISOLONE 4 MG PO TBPK: ORAL_TABLET | ORAL | 0 refills | Status: DC

## 2018-11-06 NOTE — ED Provider Notes (Signed)
[facility]    CSN: [id number] Arrival date & time: 11/06/18  1103      History   Chief Complaint Chief Complaint  Patient presents with  . Shoulder Pain    left    HPI Vanessa Fisher is a 58 y.o. female.   HPI  58 year old female diabetic, presents with the sudden onset of left shoulder pain that started about 2 days ago.  She has no known injury.  Having difficulty with abduction and external rotation.  It hurts worse at nighttime she is unable to find a comfortable position for any length of time.  Denies any numbness or tingling.  Pain is mostly confined to the shoulder especially laterally.  She does have some neck stiffness on the left.  She had foot surgery for ligament damage about 7 weeks ago.  She has been sitting in a recliner for prolonged periods.  She is also been using her arms more to help lift herself out of the chair.  She is unable to tolerate NSAIDs because of GERD.  She has a history of chronic neck pain and musculoskeletal pain.  She is on an active opioid contract with [facility] pain clinic       Past Medical History:  Diagnosis Date  . Anemia   . Anxiety   . Arthritis   . Asthma    WELL CONTROLLED  . Bile acid malabsorption syndrome   . Bradycardia    HAD AN ISSUE WITH THIS IN 2016-NO PROBLEMS SINCE  . Depression   . Diabetes mellitus without complication ([place])   . Diverticulitis   . Gastric reflux   . GERD (gastroesophageal reflux disease)   . Hand pain 05/12/2016  . Headache    H/O MIGRAINES  . History of kidney stones    H/O  . Neck pain, chronic   . Panic attacks     Patient Active Problem List   Diagnosis Date Noted  . Carpal tunnel syndrome of right wrist 12/06/2017  . Radial styloid tenosynovitis 08/11/2017  . Lumbar facet osteoarthritis 11/10/2016  . Chronic musculoskeletal pain 11/05/2016  . Lumbar radiculitis (Right) (L4) 09/28/2016  . Vitamin D insufficiency 07/30/2016  . Chronic pain syndrome 07/08/2016  .  Long term (current) use of opiate analgesic 07/08/2016  . Long term prescription opiate use 07/08/2016  . Opiate use (30 MME/day) 07/08/2016  . Chronic low back pain (Primary Source of Pain) (midline) ( (Bilateral) (L>R) 07/08/2016  . Disturbance of skin sensation 07/08/2016  . Chronic neck pain (Secondary source of pain) (midline) (Bilateral) (L>R) 07/08/2016  . DDD (degenerative disc disease), cervical 07/08/2016  . DDD (degenerative disc disease), lumbar 07/08/2016  . Cervical spondylosis 07/08/2016  . Cervical radicular pain (Right) (C7/C8) 07/08/2016  . Upper extremity numbness (Right) 07/08/2016  . Cervical facet syndrome (Bilateral) (L>R) 07/08/2016  . Osteoarthritis of knee (Bilateral) (R>L) 07/08/2016  . Chronic lower extremity pain (Right) 07/08/2016  . Lumbar facet syndrome (Bilateral) (L>R) 07/08/2016  . Grade 1 Anterolisthesis of L4 over L5 (3 mm) 07/08/2016  . Grade 1 Retrolisthesis of L5 over S1 (5 mm) 07/08/2016  . Chronic knee pain [facility] source of pain) (Bilateral) (R>L) 05/12/2016    Past Surgical History:  Procedure Laterality Date  . CARPAL TUNNEL RELEASE Right 12/06/2017   Procedure: CARPAL TUNNEL RELEASE;  Surgeon: Earnestine Leys, MD;  Location: [facility] ORS;  Service: Orthopedics;  Laterality: Right;  . DORSAL COMPARTMENT RELEASE Right 12/06/2017   Procedure: RELEASE DORSAL COMPARTMENT (DEQUERVAIN);  Surgeon: Sabra Heck,  Nadara Mustard, MD;  Location: [facility] ORS;  Service: Orthopedics;  Laterality: Right;  . PARTIAL HYSTERECTOMY    . TUBAL LIGATION      OB History   No obstetric history on file.      Home Medications    Prior to Admission medications   Medication Sig Start Date End Date Taking? Authorizing Provider  fluticasone (FLONASE) 50 MCG/ACT nasal spray Place 2 sprays into both nostrils daily.    Yes [provider]  glimepiride (AMARYL) 2 MG tablet Take 2 mg by mouth daily. 07/03/17  Yes [provider]  lisinopril (PRINIVIL,ZESTRIL) 20 MG  tablet Take 20 mg by mouth daily. 10/31/17  Yes [provider]  lisinopril (ZESTRIL) 10 MG tablet  10/10/18  Yes [provider]  meloxicam (MOBIC) 15 MG tablet Take 1 tablet (15 mg total) by mouth daily. 06/24/18  Yes Edrick Kins, DPM  oxyCODONE-acetaminophen (PERCOCET) 5-325 MG tablet Take 1 tablet by mouth every 6 (six) hours as needed for severe pain. 10/28/18  Yes Edrick Kins, DPM  albuterol (PROVENTIL HFA;VENTOLIN HFA) 108 (90 BASE) MCG/ACT inhaler Inhale 2 puffs into the lungs every 6 (six) hours as needed for wheezing or shortness of breath.  11/06/18 Yes [provider]  methylPREDNISolone (MEDROL DOSEPAK) 4 MG TBPK tablet Take per package instructions 11/06/18   Lorin Picket, PA-C  oxyCODONE-acetaminophen (PERCOCET) 5-325 MG tablet Take 1 tablet by mouth every 4 (four) hours as needed for up to 7 days for severe pain. 11/04/18 11/11/18  Felipa Furnace, DPM  triamcinolone cream (KENALOG) 0.1 % Apply 1 application topically 2 (two) times daily as needed. 08/09/18   Karen Kitchens, NP  atorvastatin (LIPITOR) 10 MG tablet Take 10 mg by mouth at bedtime.   11/06/18  [provider]  DULoxetine (CYMBALTA) 20 MG capsule Take by mouth. 11/12/17 07/27/18  [provider]  gabapentin (NEURONTIN) 400 MG capsule Take 1 capsule (400 mg total) by mouth 2 (two) times daily. 12/06/17 07/27/18  Earnestine Leys, MD  montelukast (SINGULAIR) 10 MG tablet Take 10 mg by mouth as needed.   07/27/18  [provider]  pantoprazole (PROTONIX) 40 MG tablet Take 40 mg by mouth every morning.   08/09/18  [provider]    Family History Family History  Problem Relation Age of Onset  . Diabetes Mother   . Hypertension Mother   . Heart failure Mother   . Cancer Mother   . Diabetes Father   . Hypertension Father     Social History Social History   Tobacco Use  . Smoking status: Never Smoker  . Smokeless tobacco: Never Used  Substance Use Topics  .  Alcohol use: No  . Drug use: No     Allergies   Bc powder [aspirin-salicylamide-caffeine], Gabapentin, Hydrocodone-acetaminophen, Latex, Penicillin g, Penicillins, and Shrimp [shellfish allergy]   Review of Systems Review of Systems  Constitutional: Positive for activity change. Negative for appetite change, chills, diaphoresis, fatigue and fever.  Musculoskeletal: Positive for myalgias.  All other systems reviewed and are negative.    Physical Exam Triage Vital Signs ED Triage Vitals  Enc Vitals Group     BP 11/06/18 1121 (!) 150/81     Pulse Rate 11/06/18 1121 78     Resp 11/06/18 1121 16     Temp 11/06/18 1121 98.4 F (36.9 C)     Temp Source 11/06/18 1121 Oral     SpO2 11/06/18 1121 100 %  Weight 11/06/18 1120 240 lb (108.9 kg)     Height 11/06/18 1120 5\' 7"  (1.702 m)     Head Circumference --      Peak Flow --      Pain Score 11/06/18 1119 10     Pain Loc --      Pain Edu? --      Excl. in [place]? --    No data found.  Updated Vital Signs BP (!) 150/81 (BP Location: Left Arm)   Pulse 78   Temp 98.4 F (36.9 C) (Oral)   Resp 16   Ht 5\' 7"  (1.702 m)   Wt 240 lb (108.9 kg)   SpO2 100%   BMI 37.59 kg/m   Visual Acuity Right Eye Distance:   Left Eye Distance:   Bilateral Distance:    Right Eye Near:   Left Eye Near:    Bilateral Near:     Physical Exam Vitals signs and nursing note reviewed.  Constitutional:      General: She is not in acute distress.    Appearance: Normal appearance. She is obese. She is not ill-appearing, toxic-appearing or diaphoretic.  HENT:     Head: Normocephalic and atraumatic.  Eyes:     Conjunctiva/sclera: Conjunctivae normal.  Neck:     Musculoskeletal: Normal range of motion and neck supple. Muscular tenderness present.  Cardiovascular:     Rate and Rhythm: Normal rate and regular rhythm.     Heart sounds: Normal heart sounds.  Pulmonary:     Effort: Pulmonary effort is normal.     Breath sounds: Normal breath  sounds.  Musculoskeletal:     Comments: Examination of the left upper extremity shows the patient to be able to extend the left shoulder to approximately 30 degrees abduction to 15 degrees actively passively to 45.  Internal rotation 90 degrees external rotation limited to 15 degrees with pain.  There is tenderness subacromial which appears to be maximal.  Does not appear to be any acromioclavicular joint tenderness.  There is no bicipital tendon tenderness.  She is unable to adequately perform 8 arm raise drop test.  She is very weak with an empty can test against gravity or resistance.  Skin:    General: Skin is warm and dry.  Neurological:     General: No focal deficit present.     Mental Status: She is alert and oriented to person, place, and time.  Psychiatric:        Mood and Affect: Mood normal.        Behavior: Behavior normal.        Thought Content: Thought content normal.        Judgment: Judgment normal.      UC Treatments / Results  Labs (all labs ordered are listed, but only abnormal results are displayed) Labs Reviewed - No data to display  EKG   Radiology Dg Shoulder Left  Result Date: 11/06/2018 CLINICAL DATA:  Shoulder pain, difficult abduction, no known injury EXAM: LEFT SHOULDER - 2+ VIEW COMPARISON:  None. FINDINGS: No fracture or dislocation of the left shoulder. The acromioclavicular and glenohumeral joint spaces are preserved. There are calcific bodies about the greater tuberosity of the humerus in the vicinity of the rotator cuff insertion. Partially imaged left chest is unremarkable. IMPRESSION: 1. No fracture or dislocation of the left shoulder. The acromioclavicular and glenohumeral joint spaces are preserved. 2. There are calcific bodies about the greater tuberosity of the humerus in the vicinity of the  rotator cuff insertion, which can be seen in calcific tendonitis and chronic rotator cuff pathology. Electronically Signed   By: Eddie Candle M.D.   On:  11/06/2018 13:32    Procedures Procedures (including critical care time)  Medications Ordered in UC Medications - No data to display  Initial Impression / Assessment and Plan / UC Course  I have reviewed the triage vital signs and the nursing notes.  Pertinent labs & imaging results that were available during my care of the patient were reviewed by me and considered in my medical decision making (see chart for details).   58 year old female presents with left shoulder pain particularly with motion.  X-rays revealed calcium about the greater tuberosity consistent with calcific tendinitis.  Is not able to tolerate NSAIDs due to GERD and is a diabetic although not brittle.  Therefore I will treat her with a Medrol Dosepak.  I recommended that she follow-up with orthopedic surgery for consideration of possible shoulder injection.  She was instructed in pendulum exercises to prevent frozen shoulder.  She will monitor her sugars closely because of the prednisone.  If she has a significant rise in her glucose she will contact her primary care physician.   Final Clinical Impressions(s) / UC Diagnoses   Final diagnoses:  Calcific tendinitis of left shoulder     Discharge Instructions     Please read the enclosed instructions regarding calcific tendinitis.  Perform the pendulum exercises that were demonstrated to you 3 times daily for 1 to 2 minutes each time.  Try to avoid symptoms as much as possible.  While taking the prednisone be sure to monitor your blood sugars carefully and if they rise significantly contact your primary care physician.  Recommend following up with orthopedic surgery next week for possible shoulder injection.    ED Prescriptions    Medication Sig Dispense Auth. Provider   methylPREDNISolone (MEDROL DOSEPAK) 4 MG TBPK tablet Take per package instructions 21 tablet Lorin Picket, PA-C     PDMP not reviewed this encounter.   Lorin Picket, PA-C 11/06/18 [phone]

## 2018-11-06 NOTE — Discharge Instructions (Signed)
Please read the enclosed instructions regarding calcific tendinitis.  Perform the pendulum exercises that were demonstrated to you 3 times daily for 1 to 2 minutes each time.  Try to avoid symptoms as much as possible.  While taking the prednisone be sure to monitor your blood sugars carefully and if they rise significantly contact your primary care physician.  Recommend following up with orthopedic surgery next week for possible shoulder injection.

## 2018-11-06 NOTE — ED Triage Notes (Signed)
Pt c/o left shoulder pain. Started about 2 days ago. She states that it is hard to lift her arm. No known injury.

## 2018-11-11 ENCOUNTER — Telehealth: Payer: Self-pay | Admitting: Podiatry

## 2018-11-11 ENCOUNTER — Telehealth: Payer: Self-pay

## 2018-11-11 NOTE — Telephone Encounter (Signed)
Please send medication to pharmacy if you approve.  Thanks

## 2018-11-11 NOTE — Telephone Encounter (Signed)
Patient is calling for a refill on her pain meds. She was asked to call early in the morning on Fridays. Please call patient

## 2018-11-11 NOTE — Telephone Encounter (Signed)
-----   Message from Arlyce Dice sent at 11/11/2018  9:43 AM EDT ----- Patient called in requesting a refill of current [facility] [place], states she has been out since 11/09/18 -

## 2018-11-15 ENCOUNTER — Other Ambulatory Visit: Payer: Self-pay

## 2018-11-15 ENCOUNTER — Ambulatory Visit (INDEPENDENT_AMBULATORY_CARE_PROVIDER_SITE_OTHER): Payer: Self-pay | Admitting: Podiatry

## 2018-11-15 ENCOUNTER — Other Ambulatory Visit: Payer: Medicare Other

## 2018-11-15 ENCOUNTER — Encounter: Payer: Self-pay | Admitting: Podiatry

## 2018-11-15 DIAGNOSIS — Z9889 Other specified postprocedural states: Secondary | ICD-10-CM

## 2018-11-15 DIAGNOSIS — M76822 Posterior tibial tendinitis, left leg: Secondary | ICD-10-CM

## 2018-11-15 MED ORDER — OXYCODONE-ACETAMINOPHEN 10-325 MG PO TABS: 1.0000 | ORAL_TABLET | Freq: Three times a day (TID) | ORAL | 0 refills | Status: AC | PRN

## 2018-11-21 ENCOUNTER — Telehealth: Payer: Self-pay

## 2018-11-21 DIAGNOSIS — Z9889 Other specified postprocedural states: Secondary | ICD-10-CM

## 2018-11-21 DIAGNOSIS — M76822 Posterior tibial tendinitis, left leg: Secondary | ICD-10-CM

## 2018-11-21 NOTE — Telephone Encounter (Signed)
-----   Message from Edrick Kins, DPM sent at 11/15/2018 10:53 AM EDT ----- Regarding: refer to [facility] Please refer to [facility] for second opinion as per patient's request.   - Dr. Amalia Hailey

## 2018-11-21 NOTE — Progress Notes (Signed)
   HPI: 58 y.o. female presenting today status post posterior tibial tendon repair left. DOS: 09/22/2018.  Patient states that after 2 months of rehab she is continuing to have severe pain and tenderness to the foot and ankle.  She continues to have swelling and is very sensitive along the incision site.  She would like to discuss being discharged from this practice and referred to another physician for second opinion.  She has not been wearing the immobilization cam boot since surgery because of irritation and tenderness.  She is only able to walk minimally due to the pain.  Past Medical History:  Diagnosis Date  . Anemia   . Anxiety   . Arthritis   . Asthma    WELL CONTROLLED  . Bile acid malabsorption syndrome   . Bradycardia    HAD AN ISSUE WITH THIS IN 2016-NO PROBLEMS SINCE  . Depression   . Diabetes mellitus without complication ([place])   . Diverticulitis   . Gastric reflux   . GERD (gastroesophageal reflux disease)   . Hand pain 05/12/2016  . Headache    H/O MIGRAINES  . History of kidney stones    H/O  . Neck pain, chronic   . Panic attacks      Physical Exam: General: The patient is alert and oriented x3 in no acute distress.  Dermatology: Skin is warm, dry and supple bilateral lower extremities. Negative for open lesions or macerations.  Skin incisions have completely healed  Vascular: Palpable pedal pulses bilaterally. Capillary refill within normal limits.  Moderate edema noted to the surgical foot and ankle medial aspect  Neurological: Epicritic and protective threshold grossly intact bilaterally.   Musculoskeletal Exam: Range of motion within normal limits to all pedal and ankle joints bilateral. Muscle strength 5/5 in all groups bilateral.  There is pain with activation of the posterior tibial tendon  Assessment: 1. S/p PT tendon repair left. DOS: 09/22/2018   Plan of Care:  1. Patient evaluated.  2.  I explained to the patient that I am more than happy to refer  her for a second opinion.  The patient would like to be discharged from this practice and referred to [facility] for second opinion.  Today we will place referral 3.  Refill prescription for Percocet 5/3 and 25 mg 4.  Return to clinic as needed      Edrick Kins, DPM [facility]  Dr. Edrick Kins, DPM    [address], [place] [address]                Office [phone]  Fax [phone]

## 2018-11-22 ENCOUNTER — Other Ambulatory Visit: Payer: Self-pay | Admitting: Podiatry

## 2018-11-22 MED ORDER — MELOXICAM 15 MG PO TABS: 15.0000 mg | ORAL_TABLET | Freq: Every day | ORAL | 1 refills | Status: DC

## 2018-11-22 MED ORDER — OXYCODONE-ACETAMINOPHEN 5-325 MG PO TABS: 1.0000 | ORAL_TABLET | Freq: Four times a day (QID) | ORAL | 0 refills | Status: DC | PRN

## 2018-11-22 NOTE — Progress Notes (Signed)
PRN pain and inflammation 

## 2018-11-23 ENCOUNTER — Emergency Department
Admission: EM | Admit: 2018-11-23 | Discharge: 2018-11-23 | Discharge status: Against Medical Advice | Payer: Medicare Other | Attending: Emergency Medicine | Admitting: Emergency Medicine

## 2018-11-23 ENCOUNTER — Emergency Department: Payer: Medicare Other

## 2018-11-23 ENCOUNTER — Encounter: Payer: Self-pay | Admitting: Emergency Medicine

## 2018-11-23 ENCOUNTER — Other Ambulatory Visit: Payer: Self-pay

## 2018-11-23 DIAGNOSIS — Z91040 Latex allergy status: Secondary | ICD-10-CM | POA: Insufficient documentation

## 2018-11-23 DIAGNOSIS — Z79899 Other long term (current) drug therapy: Secondary | ICD-10-CM | POA: Insufficient documentation

## 2018-11-23 DIAGNOSIS — E119 Type 2 diabetes mellitus without complications: Secondary | ICD-10-CM | POA: Diagnosis not present

## 2018-11-23 DIAGNOSIS — Z5320 Procedure and treatment not carried out because of patient's decision for unspecified reasons: Secondary | ICD-10-CM | POA: Insufficient documentation

## 2018-11-23 DIAGNOSIS — M25572 Pain in left ankle and joints of left foot: Secondary | ICD-10-CM | POA: Diagnosis not present

## 2018-11-23 DIAGNOSIS — Z5329 Procedure and treatment not carried out because of patient's decision for other reasons: Secondary | ICD-10-CM

## 2018-11-23 DIAGNOSIS — J45909 Unspecified asthma, uncomplicated: Secondary | ICD-10-CM | POA: Diagnosis not present

## 2018-11-23 DIAGNOSIS — Z7984 Long term (current) use of oral hypoglycemic drugs: Secondary | ICD-10-CM | POA: Diagnosis not present

## 2018-11-23 DIAGNOSIS — R2242 Localized swelling, mass and lump, left lower limb: Secondary | ICD-10-CM | POA: Insufficient documentation

## 2018-11-23 NOTE — ED Notes (Signed)
This RN witnessed patient attempting to leave the ED.  Patient states she could not wait for provider's plan of care because her sister is her ride and needed to leave immediately.  This RN apologized for patient's wait and discussed with provider, Roderic Palau who stated patient would need to leave AMA if she insisted on leaving.  Patient signed AMA.

## 2018-11-23 NOTE — ED Triage Notes (Signed)
Patient presents to the ED with increased left ankle pain x 3 days.  Patient reports surgery to her ankle 2 months ago.  Patient reports ankle has been somewhat swollen since the surgery and has had multiple infections.  Patient states the current pain she is having is new.  Patient denies known injury.

## 2018-11-23 NOTE — ED Provider Notes (Signed)
[facility] Emergency Department Provider Note  ____________________________________________  Time seen: Approximately 4:22 PM  I have reviewed the triage vital signs and the nursing notes.   HISTORY  Chief Complaint Ankle Pain    HPI Vanessa Fisher is a 58 y.o. female who presents the emergency department for evaluation of left ankle pain, swelling, warmth and fever.  Patient reports that she had surgery 2 months ago and has had problems since.  She has had 2 episodes of infection in the past 2 months.  Patient was concerned that she may have a return of infection.  Patient reports that she has had increased pain, mild increased edema but she has chronic edema status post surgery.  Patient reports that the joint has felt warm to the touch and she had a low-grade fever last week.  Patient with no other complaints at this time.  No trauma to the ankle.  Patient wears a boot for stabilization.         Past Medical History:  Diagnosis Date  . Anemia   . Anxiety   . Arthritis   . Asthma    WELL CONTROLLED  . Bile acid malabsorption syndrome   . Bradycardia    HAD AN ISSUE WITH THIS IN 2016-NO PROBLEMS SINCE  . Depression   . Diabetes mellitus without complication ([place])   . Diverticulitis   . Gastric reflux   . GERD (gastroesophageal reflux disease)   . Hand pain 05/12/2016  . Headache    H/O MIGRAINES  . History of kidney stones    H/O  . Neck pain, chronic   . Panic attacks     Patient Active Problem List   Diagnosis Date Noted  . Carpal tunnel syndrome of right wrist 12/06/2017  . Radial styloid tenosynovitis 08/11/2017  . Lumbar facet osteoarthritis 11/10/2016  . Chronic musculoskeletal pain 11/05/2016  . Lumbar radiculitis (Right) (L4) 09/28/2016  . Vitamin D insufficiency 07/30/2016  . Chronic pain syndrome 07/08/2016  . Long term (current) use of opiate analgesic 07/08/2016  . Long term prescription opiate use 07/08/2016  . Opiate  use (30 MME/day) 07/08/2016  . Chronic low back pain (Primary Source of Pain) (midline) ( (Bilateral) (L>R) 07/08/2016  . Disturbance of skin sensation 07/08/2016  . Chronic neck pain (Secondary source of pain) (midline) (Bilateral) (L>R) 07/08/2016  . DDD (degenerative disc disease), cervical 07/08/2016  . DDD (degenerative disc disease), lumbar 07/08/2016  . Cervical spondylosis 07/08/2016  . Cervical radicular pain (Right) (C7/C8) 07/08/2016  . Upper extremity numbness (Right) 07/08/2016  . Cervical facet syndrome (Bilateral) (L>R) 07/08/2016  . Osteoarthritis of knee (Bilateral) (R>L) 07/08/2016  . Chronic lower extremity pain (Right) 07/08/2016  . Lumbar facet syndrome (Bilateral) (L>R) 07/08/2016  . Grade 1 Anterolisthesis of L4 over L5 (3 mm) 07/08/2016  . Grade 1 Retrolisthesis of L5 over S1 (5 mm) 07/08/2016  . Chronic knee pain [facility] source of pain) (Bilateral) (R>L) 05/12/2016    Past Surgical History:  Procedure Laterality Date  . CARPAL TUNNEL RELEASE Right 12/06/2017   Procedure: CARPAL TUNNEL RELEASE;  Surgeon: Earnestine Leys, MD;  Location: [facility] ORS;  Service: Orthopedics;  Laterality: Right;  . DORSAL COMPARTMENT RELEASE Right 12/06/2017   Procedure: RELEASE DORSAL COMPARTMENT (DEQUERVAIN);  Surgeon: Earnestine Leys, MD;  Location: [facility] ORS;  Service: Orthopedics;  Laterality: Right;  . PARTIAL HYSTERECTOMY    . TUBAL LIGATION      Prior to Admission medications   Medication Sig Start Date End Date  Taking? Authorizing Provider  fluticasone (FLONASE) 50 MCG/ACT nasal spray Place 2 sprays into both nostrils daily.     [provider]  glimepiride (AMARYL) 2 MG tablet Take 2 mg by mouth daily. 07/03/17   [provider]  lisinopril (PRINIVIL,ZESTRIL) 20 MG tablet Take 20 mg by mouth daily. 10/31/17   [provider]  lisinopril (ZESTRIL) 10 MG tablet  10/10/18   [provider]  meloxicam (MOBIC) 15 MG tablet Take 1 tablet (15 mg total)  by mouth daily. 11/22/18   Edrick Kins, DPM  methylPREDNISolone (MEDROL DOSEPAK) 4 MG TBPK tablet Take per package instructions 11/06/18   Lorin Picket, PA-C  oxyCODONE-acetaminophen (PERCOCET) 5-325 MG tablet Take 1 tablet by mouth every 6 (six) hours as needed for severe pain. 11/22/18   Edrick Kins, DPM  triamcinolone cream (KENALOG) 0.1 % Apply 1 application topically 2 (two) times daily as needed. 08/09/18   Karen Kitchens, NP  albuterol (PROVENTIL HFA;VENTOLIN HFA) 108 (90 BASE) MCG/ACT inhaler Inhale 2 puffs into the lungs every 6 (six) hours as needed for wheezing or shortness of breath.  11/06/18  [provider]  atorvastatin (LIPITOR) 10 MG tablet Take 10 mg by mouth at bedtime.   11/06/18  [provider]  DULoxetine (CYMBALTA) 20 MG capsule Take by mouth. 11/12/17 07/27/18  [provider]  gabapentin (NEURONTIN) 400 MG capsule Take 1 capsule (400 mg total) by mouth 2 (two) times daily. 12/06/17 07/27/18  Earnestine Leys, MD  montelukast (SINGULAIR) 10 MG tablet Take 10 mg by mouth as needed.   07/27/18  [provider]  pantoprazole (PROTONIX) 40 MG tablet Take 40 mg by mouth every morning.   08/09/18  [provider]    Allergies Bc powder [aspirin-salicylamide-caffeine], Gabapentin, Hydrocodone-acetaminophen, Latex, Penicillin g, Penicillins, and Shrimp [shellfish allergy]  Family History  Problem Relation Age of Onset  . Diabetes Mother   . Hypertension Mother   . Heart failure Mother   . Cancer Mother   . Diabetes Father   . Hypertension Father     Social History Social History   Tobacco Use  . Smoking status: Never Smoker  . Smokeless tobacco: Never Used  Substance Use Topics  . Alcohol use: No  . Drug use: No     Review of Systems  Constitutional: No fever/chills Eyes: No visual changes. No discharge ENT: No upper respiratory complaints. Cardiovascular: no chest pain. Respiratory: no cough. No  SOB. Gastrointestinal: No abdominal pain.  No nausea, no vomiting.   Musculoskeletal: Ongoing left ankle pain, edema, warmth to touch Skin: Negative for rash, abrasions, lacerations, ecchymosis. Neurological: Negative for headaches, focal weakness or numbness. 10-point ROS otherwise negative.  ____________________________________________   PHYSICAL EXAM:  VITAL SIGNS: ED Triage Vitals  Enc Vitals Group     BP      Pulse      Resp      Temp      Temp src      SpO2      Weight      Height      Head Circumference      Peak Flow      Pain Score      Pain Loc      Pain Edu?      Excl. in [place]?      Constitutional: Alert and oriented. Well appearing and in no acute distress. Eyes: Conjunctivae are normal. PERRL. EOMI. Head: Atraumatic.  Neck: No stridor.  Cardiovascular: Normal rate, regular rhythm. Normal S1 and S2.  Good peripheral circulation. Respiratory: Normal respiratory effort without tachypnea or retractions. Lungs CTAB. Good air entry to the bases with no decreased or absent breath sounds. Musculoskeletal: Full range of motion to all extremities. No gross deformities appreciated.  Visualization of the left ankle reveals edema compared with right.  Limited range of motion due to pain.  Patient is tender palpation globally over the ankle but most specifically over the medial malleolus.  Patient has a well-healed surgical scar to the medial aspect of the ankle joint.  No gross erythema.  On palpation, mild palpable edema/effusion along the anterior aspect of the ankle.  No palpable fluctuance or induration.  Dorsalis pedis pulse intact.  Sensation intact all digits. Neurologic:  Normal speech and language. No gross focal neurologic deficits are appreciated.  Skin:  Skin is warm, dry and intact. No rash noted. Psychiatric: Mood and affect are normal. Speech and behavior are normal. Patient exhibits appropriate insight and  judgement.   ____________________________________________   LABS (all labs ordered are listed, but only abnormal results are displayed)  Labs Reviewed  CULTURE, BLOOD (ROUTINE X 2)  CULTURE, BLOOD (ROUTINE X 2)  COMPREHENSIVE METABOLIC PANEL  CBC WITH DIFFERENTIAL/PLATELET  LACTIC ACID, PLASMA  LACTIC ACID, PLASMA  URIC ACID   ____________________________________________  EKG   ____________________________________________  RADIOLOGY   No results found.  ____________________________________________    PROCEDURES  Procedure(s) performed:    Procedures    Medications - No data to display   ____________________________________________   INITIAL IMPRESSION / ASSESSMENT AND PLAN / ED COURSE  Pertinent labs & imaging results that were available during my care of the patient were reviewed by me and considered in my medical decision making (see chart for details).  Review of the [place] CSRS was performed in accordance of the [place] prior to dispensing any controlled drugs.  Clinical Course as of Nov 22 1733  Wed Nov 23, 2018  1719 Patient presents emergency department complaining of left ankle pain.  Patient has surgery to the posterior tibial tendon 2 months prior.  Patient has had complications from surgery including increased sensory pain to the lesion, repeat infections, decreased range of motion.  Patient has been going to physical therapy but that was stopped given complications.  Patient reports that she had swelling, warmth to the joint on palpation and a low-grade fever.  Patient was afebrile here.  Diffuse tenderness to palpation globally about the ankle joint but more so over the incision along the medial malleolus.  Findings consistent with probably chronic effusion of the ankle joint.  No evidence of cellulitis to the skin.  No evidence of superficial abscess.  Given the history of 2 recurrent infections after surgery I will evaluation of the patient with labs,  x-ray.  Differential includes ongoing joint effusion, chronic postop pain, reinjury of the ankle joint/ligament, infection, osteomyelitis, gout.   [JC]  1728 Prior to labs and x-ray, patient and verbalized that she would like to leave.  Patient understands risks.  At this time I have a low suspicion for septic arthritis or osteomyelitis.  I suspect this is likely chronic but again without labs or imaging I am unable to further assess patient's complaint.  Patient understands the risk of leaving and signed out AMA.   [JC]    Clinical Course User Index [JC] Dezmen Alcock, Charline Bills, PA-C          Patient was seen and assessed by myself.  Given patient's history, I did recommend labs, imaging.  Patient initially was agreeable to this plan, however she informed the nurse that she would like to leave.  Patient will sign out AMA.  Patient is aware she may return to emergency department for evaluation at any time.  No labs, imaging is performed at this time.    ____________________________________________  FINAL CLINICAL IMPRESSION(S) / ED DIAGNOSES  Final diagnoses:  Left against medical advice  Left ankle pain, unspecified chronicity      NEW MEDICATIONS STARTED DURING THIS VISIT:  ED Discharge Orders    None          This chart was dictated using voice recognition software/Dragon. Despite best efforts to proofread, errors can occur which can change the meaning. Any change was purely unintentional.    Darletta Moll, PA-C 11/23/18 1735    Delman Kitten, MD 11/23/18 2126

## 2018-11-23 NOTE — Telephone Encounter (Signed)
Referral has been faxed to [facility] orthopedic clinic Foot/ankle team.  They will contact her with appt

## 2018-11-24 ENCOUNTER — Emergency Department: Payer: Medicare Other

## 2018-11-24 ENCOUNTER — Encounter: Payer: Self-pay | Admitting: Emergency Medicine

## 2018-11-24 ENCOUNTER — Emergency Department
Admission: EM | Admit: 2018-11-24 | Discharge: 2018-11-24 | Disposition: A | Discharge status: Home / Self Care | Payer: Medicare Other | Attending: Emergency Medicine | Admitting: Emergency Medicine

## 2018-11-24 DIAGNOSIS — Z79899 Other long term (current) drug therapy: Secondary | ICD-10-CM | POA: Insufficient documentation

## 2018-11-24 DIAGNOSIS — J45909 Unspecified asthma, uncomplicated: Secondary | ICD-10-CM | POA: Diagnosis not present

## 2018-11-24 DIAGNOSIS — Z91040 Latex allergy status: Secondary | ICD-10-CM | POA: Insufficient documentation

## 2018-11-24 DIAGNOSIS — Z9889 Other specified postprocedural states: Secondary | ICD-10-CM | POA: Diagnosis not present

## 2018-11-24 DIAGNOSIS — E119 Type 2 diabetes mellitus without complications: Secondary | ICD-10-CM | POA: Diagnosis not present

## 2018-11-24 DIAGNOSIS — M25572 Pain in left ankle and joints of left foot: Secondary | ICD-10-CM | POA: Diagnosis present

## 2018-11-24 LAB — COMPREHENSIVE METABOLIC PANEL
ALT: 15 U/L (ref 0–44)
AST: 15 U/L (ref 15–41)
Albumin: 3.5 g/dL (ref 3.5–5.0)
Alkaline Phosphatase: 97 U/L (ref 38–126)
Anion gap: 9 (ref 5–15)
BUN: 12 mg/dL (ref 6–20)
CO2: 26 mmol/L (ref 22–32)
Calcium: 9.1 mg/dL (ref 8.9–10.3)
Chloride: 106 mmol/L (ref 98–111)
Creatinine, Ser: 0.72 mg/dL (ref 0.44–1.00)
GFR calc Af Amer: >60 mL/min (ref >60)
GFR calc non Af Amer: >60 mL/min (ref >60)
Glucose, Bld: 114 mg/dL — ABNORMAL HIGH (ref 70–99)
Potassium: 3.8 mmol/L (ref 3.5–5.1)
Sodium: 141 mmol/L (ref 135–145)
Total Bilirubin: 0.6 mg/dL (ref 0.3–1.2)
Total Protein: 7 g/dL (ref 6.5–8.1)

## 2018-11-24 LAB — CBC WITH DIFFERENTIAL/PLATELET
Abs Immature Granulocytes: 0.02 10*3/uL (ref 0.00–0.07)
Basophils Absolute: 0.1 10*3/uL (ref 0.0–0.1)
Basophils Relative: 1 %
Eosinophils Absolute: 0.1 10*3/uL (ref 0.0–0.5)
Eosinophils Relative: 1 %
HCT: 38.2 % (ref 36.0–46.0)
Hemoglobin: 11.5 g/dL — ABNORMAL LOW (ref 12.0–15.0)
Immature Granulocytes: 0 %
Lymphocytes Relative: 38 %
Lymphs Abs: 2.2 10*3/uL (ref 0.7–4.0)
MCH: 24.7 pg — ABNORMAL LOW (ref 26.0–34.0)
MCHC: 30.1 g/dL (ref 30.0–36.0)
MCV: 82 fL (ref 80.0–100.0)
Monocytes Absolute: 0.3 10*3/uL (ref 0.1–1.0)
Monocytes Relative: 6 %
Neutro Abs: 3.2 10*3/uL (ref 1.7–7.7)
Neutrophils Relative %: 54 %
Platelets: 291 10*3/uL (ref 150–400)
RBC: 4.66 MIL/uL (ref 3.87–5.11)
RDW: 14.4 % (ref 11.5–15.5)
WBC: 5.9 10*3/uL (ref 4.0–10.5)
nRBC: 0 % (ref 0.0–0.2)

## 2018-11-24 MED ORDER — KETOROLAC TROMETHAMINE 30 MG/ML IJ SOLN
30.0000 mg | Freq: Once | INTRAMUSCULAR | Status: AC
  Administered 2018-11-24: 30 mg via INTRAMUSCULAR
  Filled 2018-11-24: qty 1

## 2018-11-24 NOTE — ED Provider Notes (Signed)
[facility] Emergency Department Provider Note  ____________________________________________  Time seen: Approximately 1:01 PM  I have reviewed the triage vital signs and the nursing notes.   HISTORY  Chief Complaint Ankle Pain    HPI Vanessa Fisher is a 58 y.o. female presents emergency department for evaluation of pain and swelling to left ankle for 2 months.  Patient states that she had surgery with Dr. Amalia Hailey in [place] on the ligaments to her left ankle.  She has continued to have swelling since the surgery. Swelling is only to the inside of her ankle and not the rest of her foot. No leg swelling. She was put on doxycycline 2 weeks ago for a possible infection.  At this time she had a fever.  She followed up with Dr. Amalia Hailey last week after she had discontinued the antibiotics and was given a prescription for Mobic.  She states that Dr. Amalia Hailey said that it is not healing correctly.  She was also given a referral to [facility] for a second opinion.  They had also discussed doing an MRI.  She was going to physical therapy by Dr. Amalia Hailey discontinued this because she states that it was not helping with the swelling.  She was also wearing a sleeve to her ankle but that was not helping with the swelling either.  She has tried to call [facility] for an appointment but has yet to hear back from them.  She has not tried steroids because she is a diabetic.  No fevers, increasing swelling.   Past Medical History:  Diagnosis Date  . Anemia   . Anxiety   . Arthritis   . Asthma    WELL CONTROLLED  . Bile acid malabsorption syndrome   . Bradycardia    HAD AN ISSUE WITH THIS IN 2016-NO PROBLEMS SINCE  . Depression   . Diabetes mellitus without complication ([place])   . Diverticulitis   . Gastric reflux   . GERD (gastroesophageal reflux disease)   . Hand pain 05/12/2016  . Headache    H/O MIGRAINES  . History of kidney stones    H/O  . Neck pain, chronic   . Panic attacks      Patient Active Problem List   Diagnosis Date Noted  . Carpal tunnel syndrome of right wrist 12/06/2017  . Radial styloid tenosynovitis 08/11/2017  . Lumbar facet osteoarthritis 11/10/2016  . Chronic musculoskeletal pain 11/05/2016  . Lumbar radiculitis (Right) (L4) 09/28/2016  . Vitamin D insufficiency 07/30/2016  . Chronic pain syndrome 07/08/2016  . Long term (current) use of opiate analgesic 07/08/2016  . Long term prescription opiate use 07/08/2016  . Opiate use (30 MME/day) 07/08/2016  . Chronic low back pain (Primary Source of Pain) (midline) ( (Bilateral) (L>R) 07/08/2016  . Disturbance of skin sensation 07/08/2016  . Chronic neck pain (Secondary source of pain) (midline) (Bilateral) (L>R) 07/08/2016  . DDD (degenerative disc disease), cervical 07/08/2016  . DDD (degenerative disc disease), lumbar 07/08/2016  . Cervical spondylosis 07/08/2016  . Cervical radicular pain (Right) (C7/C8) 07/08/2016  . Upper extremity numbness (Right) 07/08/2016  . Cervical facet syndrome (Bilateral) (L>R) 07/08/2016  . Osteoarthritis of knee (Bilateral) (R>L) 07/08/2016  . Chronic lower extremity pain (Right) 07/08/2016  . Lumbar facet syndrome (Bilateral) (L>R) 07/08/2016  . Grade 1 Anterolisthesis of L4 over L5 (3 mm) 07/08/2016  . Grade 1 Retrolisthesis of L5 over S1 (5 mm) 07/08/2016  . Chronic knee pain [facility] source of pain) (Bilateral) (R>L) 05/12/2016  Past Surgical History:  Procedure Laterality Date  . CARPAL TUNNEL RELEASE Right 12/06/2017   Procedure: CARPAL TUNNEL RELEASE;  Surgeon: Earnestine Leys, MD;  Location: [facility] ORS;  Service: Orthopedics;  Laterality: Right;  . DORSAL COMPARTMENT RELEASE Right 12/06/2017   Procedure: RELEASE DORSAL COMPARTMENT (DEQUERVAIN);  Surgeon: Earnestine Leys, MD;  Location: [facility] ORS;  Service: Orthopedics;  Laterality: Right;  . PARTIAL HYSTERECTOMY    . TUBAL LIGATION      Prior to Admission medications   Medication Sig Start Date End  Date Taking? Authorizing Provider  fluticasone (FLONASE) 50 MCG/ACT nasal spray Place 2 sprays into both nostrils daily.     [provider]  glimepiride (AMARYL) 2 MG tablet Take 2 mg by mouth daily. 07/03/17   [provider]  lisinopril (PRINIVIL,ZESTRIL) 20 MG tablet Take 20 mg by mouth daily. 10/31/17   [provider]  lisinopril (ZESTRIL) 10 MG tablet  10/10/18   [provider]  meloxicam (MOBIC) 15 MG tablet Take 1 tablet (15 mg total) by mouth daily. 11/22/18   Edrick Kins, DPM  methylPREDNISolone (MEDROL DOSEPAK) 4 MG TBPK tablet Take per package instructions 11/06/18   Lorin Picket, PA-C  oxyCODONE-acetaminophen (PERCOCET) 5-325 MG tablet Take 1 tablet by mouth every 6 (six) hours as needed for severe pain. 11/22/18   Edrick Kins, DPM  triamcinolone cream (KENALOG) 0.1 % Apply 1 application topically 2 (two) times daily as needed. 08/09/18   Karen Kitchens, NP  albuterol (PROVENTIL HFA;VENTOLIN HFA) 108 (90 BASE) MCG/ACT inhaler Inhale 2 puffs into the lungs every 6 (six) hours as needed for wheezing or shortness of breath.  11/06/18  [provider]  atorvastatin (LIPITOR) 10 MG tablet Take 10 mg by mouth at bedtime.   11/06/18  [provider]  DULoxetine (CYMBALTA) 20 MG capsule Take by mouth. 11/12/17 07/27/18  [provider]  gabapentin (NEURONTIN) 400 MG capsule Take 1 capsule (400 mg total) by mouth 2 (two) times daily. 12/06/17 07/27/18  Earnestine Leys, MD  montelukast (SINGULAIR) 10 MG tablet Take 10 mg by mouth as needed.   07/27/18  [provider]  pantoprazole (PROTONIX) 40 MG tablet Take 40 mg by mouth every morning.   08/09/18  [provider]    Allergies Bc powder [aspirin-salicylamide-caffeine], Gabapentin, Hydrocodone-acetaminophen, Latex, Penicillin g, Penicillins, and Shrimp [shellfish allergy]  Family History  Problem Relation Age of Onset  . Diabetes Mother   . Hypertension Mother    . Heart failure Mother   . Cancer Mother   . Diabetes Father   . Hypertension Father     Social History Social History   Tobacco Use  . Smoking status: Never Smoker  . Smokeless tobacco: Never Used  Substance Use Topics  . Alcohol use: No  . Drug use: No     Review of Systems  Constitutional: No fever/chills Respiratory: No SOB. Gastrointestinal: No abdominal pain.  No nausea, no vomiting.  Musculoskeletal: Positive for ankle pain. Skin: Negative for rash, abrasions, lacerations, ecchymosis.  Positive for healed incision. Neurological: Negative for numbness or tingling   ____________________________________________   PHYSICAL EXAM:  VITAL SIGNS: ED Triage Vitals  Enc Vitals Group     BP 11/24/18 1107 (!) 168/73     Pulse Rate 11/24/18 1107 79     Resp 11/24/18 1107 16     Temp 11/24/18 1107 98.1 F (36.7 C)     Temp Source 11/24/18 1107 Oral     SpO2 11/24/18  1107 100 %     Weight 11/24/18 1052 239 lb 13.8 oz (108.8 kg)     Height 11/24/18 1052 5\' 7"  (1.702 m)     Head Circumference --      Peak Flow --      Pain Score 11/24/18 1059 8     Pain Loc --      Pain Edu? --      Excl. in [place]? --      Constitutional: Alert and oriented. Well appearing and in no acute distress. Eyes: Conjunctivae are normal. PERRL. EOMI. Head: Atraumatic. ENT:      Ears:      Nose: No congestion/rhinnorhea.      Mouth/Throat: Mucous membranes are moist.  Neck: No stridor.   Cardiovascular: Normal rate, regular rhythm.  Good peripheral circulation.  Symmetric pedal pulses bilaterally. Respiratory: Normal respiratory effort without tachypnea or retractions. Lungs CTAB. Good air entry to the bases with no decreased or absent breath sounds. Musculoskeletal: No gross deformities appreciated.  Mild swelling to left medial malleolus. No swelling to dorsal or lateral foot or ankle. No leg swelling. No calf pain.  Limited range of motion of left ankle due to pain.  Full range of motion  of toes.  Neurologic:  Normal speech and language. No gross focal neurologic deficits are appreciated.  Skin:  Skin is warm, dry and intact. No rash noted.  Well-healed incision to left medial malleolus with no surrounding erythema or drainage. Psychiatric: Mood and affect are normal. Speech and behavior are normal. Patient exhibits appropriate insight and judgement.   ____________________________________________   LABS (all labs ordered are listed, but only abnormal results are displayed)  Labs Reviewed  COMPREHENSIVE METABOLIC PANEL - Abnormal; Notable for the following components:      Result Value   Glucose, Bld 114 (*)    All other components within normal limits  CBC WITH DIFFERENTIAL/PLATELET - Abnormal; Notable for the following components:   Hemoglobin 11.5 (*)    MCH 24.7 (*)    All other components within normal limits   ____________________________________________  EKG   ____________________________________________  RADIOLOGY Robinette Haines, personally viewed and evaluated these images (plain radiographs) as part of my medical decision making, as well as reviewing the written report by the radiologist.  Dg Ankle Complete Left  Result Date: 11/24/2018 CLINICAL DATA:  The patient is status post left ankle surgery 2 months ago. Medial left ankle swelling. No known injury. EXAM: LEFT ANKLE COMPLETE - 3+ VIEW COMPARISON:  MRI left ankle 09/04/2018. Plain films left foot 06/24/2018. FINDINGS: No acute bony or joint abnormality is identified. Medial soft tissue swelling is seen. No soft tissue gas or radiopaque foreign body. No tibiotalar joint effusion. Plantar calcaneal spur incidentally noted. IMPRESSION: Medial soft tissue swelling without radiopaque foreign body or gas. No acute bony abnormality. Electronically Signed   By: Inge Rise M.D.   On: 11/24/2018 12:22    ____________________________________________    PROCEDURES  Procedure(s) performed:     Procedures    Medications - No data to display   ____________________________________________   INITIAL IMPRESSION / ASSESSMENT AND PLAN / ED COURSE  Pertinent labs & imaging results that were available during my care of the patient were reviewed by me and considered in my medical decision making (see chart for details).  Review of the [place] CSRS was performed in accordance of the [place] prior to dispensing any controlled drugs.   Patient presented to emergency department for evaluation of chronic  left ankle pain following surgery 2 months ago.  White blood cell count within normal limits.  I do not suspect any infection currently.  I suspect that this is chronic inflammation.  Patient was given Toradol for pain and inflammation in the emergency department.  On review of the [place], patient received a prescription 2 days ago for oxycodone from Dr. Amalia Hailey.  Patient states that she has not filled this yet.  Patient was instructed to take this oxycodone for continued pain.  Patient is to follow up with podiatry as directed. Patient is given ED precautions to return to the ED for any worsening or new symptoms.   DEVONTE SHOPE was evaluated in Emergency Department on 11/24/2018 for the symptoms described in the history of present illness. She was evaluated in the context of the global COVID-19 pandemic, which necessitated consideration that the patient might be at risk for infection with the SARS-CoV-2 virus that causes COVID-19. Institutional protocols and algorithms that pertain to the evaluation of patients at risk for COVID-19 are in a state of rapid change based on information released by regulatory bodies including the CDC and federal and state organizations. These policies and algorithms were followed during the patient's care in the ED.  ____________________________________________  FINAL CLINICAL IMPRESSION(S) / ED DIAGNOSES  Final diagnoses:  Left ankle pain, unspecified  chronicity  History of ankle surgery      NEW MEDICATIONS STARTED DURING THIS VISIT:  ED Discharge Orders    None          This chart was dictated using voice recognition software/Dragon. Despite best efforts to proofread, errors can occur which can change the meaning. Any change was purely unintentional.    Laban Emperor, PA-C 11/24/18 1818    Earleen Newport, MD 11/26/18 [id number]

## 2018-11-24 NOTE — ED Notes (Signed)
See triage note  Presents with pain and swelling to left foot  States she had surgery on foot about 2 months ago  conts to have pain with swelling  Suture line healed well good pulses  Afebrile on arrival  But states she did had fever last week

## 2018-11-24 NOTE — ED Triage Notes (Signed)
Has pain and swelling to left ankle with history of surgery on same a couple months ago.  Came here yesterday for same, but could not stay for labs and xrays.

## 2018-11-29 ENCOUNTER — Encounter: Payer: Self-pay | Admitting: Podiatry

## 2018-11-29 ENCOUNTER — Other Ambulatory Visit: Payer: Self-pay

## 2018-11-29 ENCOUNTER — Ambulatory Visit (INDEPENDENT_AMBULATORY_CARE_PROVIDER_SITE_OTHER): Payer: Medicare Other | Admitting: Podiatry

## 2018-11-29 ENCOUNTER — Other Ambulatory Visit: Payer: Medicare Other

## 2018-11-29 DIAGNOSIS — M76822 Posterior tibial tendinitis, left leg: Secondary | ICD-10-CM | POA: Diagnosis not present

## 2018-11-29 DIAGNOSIS — Z9889 Other specified postprocedural states: Secondary | ICD-10-CM

## 2018-11-29 MED ORDER — OXYCODONE-ACETAMINOPHEN 10-325 MG PO TABS: 1.0000 | ORAL_TABLET | Freq: Four times a day (QID) | ORAL | 0 refills | Status: AC | PRN

## 2018-12-01 ENCOUNTER — Telehealth: Payer: Self-pay

## 2018-12-01 DIAGNOSIS — M76822 Posterior tibial tendinitis, left leg: Secondary | ICD-10-CM

## 2018-12-01 DIAGNOSIS — Z9889 Other specified postprocedural states: Secondary | ICD-10-CM

## 2018-12-01 NOTE — Telephone Encounter (Signed)
Per [name]. With [employer], no prior authorization is needed for MRI.   Patient has been notified and will call to set up appt to her convenience

## 2018-12-01 NOTE — Progress Notes (Signed)
   HPI: 58 y.o. female presenting today status post posterior tibial tendon repair left. DOS: 09/22/2018. She reports continued pain around the incision site with associated swelling. She was referred to [facility] at her request for a second opinion but they will not see her since she is still in the post op period. She has been taking Percocet for pain relief. Being on the foot increases the pain. Patient is here for further evaluation and treatment.   Past Medical History:  Diagnosis Date  . Anemia   . Anxiety   . Arthritis   . Asthma    WELL CONTROLLED  . Bile acid malabsorption syndrome   . Bradycardia    HAD AN ISSUE WITH THIS IN 2016-NO PROBLEMS SINCE  . Depression   . Diabetes mellitus without complication ([place])   . Diverticulitis   . Gastric reflux   . GERD (gastroesophageal reflux disease)   . Hand pain 05/12/2016  . Headache    H/O MIGRAINES  . History of kidney stones    H/O  . Neck pain, chronic   . Panic attacks      Physical Exam: General: The patient is alert and oriented x3 in no acute distress.  Dermatology: Skin is warm, dry and supple bilateral lower extremities. Negative for open lesions or macerations.  Skin incisions have completely healed  Vascular: Palpable pedal pulses bilaterally. Capillary refill within normal limits.  Moderate edema noted to the surgical foot and ankle medial aspect  Neurological: Epicritic and protective threshold grossly intact bilaterally.   Musculoskeletal Exam: Pain on palpation noted to the posterior tibial tendon of the left foot as well as to the anterior, lateral and medial aspects of the left ankle. Range of motion within normal limits to all pedal and ankle joints bilateral. Muscle strength 5/5 in all groups bilateral.  There is pain with activation of the posterior tibial tendon  Assessment: 1. S/p PT tendon repair left. DOS: 09/22/2018 2. Pain and edema left ankle 3. Posterior tibial tendinitis left    Plan of Care:  1.  Patient evaluated.  2. Unna boot applied.  3. Continue using post op shoe.  4. Refill prescription for Percocet 10/325 mg provided to patient.  5. MRI of the left ankle ordered.  6. Return to clinic in one week for reapplication of unna boot.       Edrick Kins, DPM [facility]  Dr. Edrick Kins, DPM    [address], [place] [address]                Office [phone]  Fax [phone]

## 2018-12-01 NOTE — Telephone Encounter (Signed)
-----   Message from Edrick Kins, DPM sent at 11/29/2018  9:12 AM EDT ----- Regarding: MRI left ankle Please order MRI left ankle w/out contrast.   Dx: s/p PT tendon repair left. DOS 09/22/2018. Severe pain out of proportion  Thanks, dr. Amalia Hailey

## 2018-12-03 ENCOUNTER — Ambulatory Visit
Admission: RE | Admit: 2018-12-03 | Discharge: 2018-12-03 | Disposition: A | Discharge status: Home / Self Care | Payer: Medicare Other | Source: Ambulatory Visit | Attending: Podiatry | Admitting: Podiatry

## 2018-12-03 DIAGNOSIS — Z9889 Other specified postprocedural states: Secondary | ICD-10-CM | POA: Insufficient documentation

## 2018-12-03 DIAGNOSIS — M76822 Posterior tibial tendinitis, left leg: Secondary | ICD-10-CM

## 2018-12-06 ENCOUNTER — Other Ambulatory Visit: Payer: Self-pay

## 2018-12-06 ENCOUNTER — Ambulatory Visit (INDEPENDENT_AMBULATORY_CARE_PROVIDER_SITE_OTHER): Payer: Medicare Other | Admitting: Podiatry

## 2018-12-06 ENCOUNTER — Encounter: Payer: Self-pay | Admitting: Podiatry

## 2018-12-06 DIAGNOSIS — M76822 Posterior tibial tendinitis, left leg: Secondary | ICD-10-CM

## 2018-12-06 DIAGNOSIS — Z9889 Other specified postprocedural states: Secondary | ICD-10-CM

## 2018-12-06 MED ORDER — OXYCODONE-ACETAMINOPHEN 10-325 MG PO TABS: 1.0000 | ORAL_TABLET | Freq: Three times a day (TID) | ORAL | 0 refills | Status: AC | PRN

## 2018-12-06 MED ORDER — METHYLPREDNISOLONE 4 MG PO TBPK: ORAL_TABLET | ORAL | 0 refills | Status: DC

## 2018-12-09 NOTE — Progress Notes (Signed)
   HPI: 58 y.o. female presenting today status post posterior tibial tendon repair left. DOS: 09/22/2018. She states she is doing well overall. She reports a small decrease in the pain and swelling. She has been taking Percocet for treatment. Being on the foot increases the pain. She had an MRI done on 12/03/2018. Patient is here for further evaluation and treatment.   Past Medical History:  Diagnosis Date  . Anemia   . Anxiety   . Arthritis   . Asthma    WELL CONTROLLED  . Bile acid malabsorption syndrome   . Bradycardia    HAD AN ISSUE WITH THIS IN 2016-NO PROBLEMS SINCE  . Depression   . Diabetes mellitus without complication ([place])   . Diverticulitis   . Gastric reflux   . GERD (gastroesophageal reflux disease)   . Hand pain 05/12/2016  . Headache    H/O MIGRAINES  . History of kidney stones    H/O  . Neck pain, chronic   . Panic attacks      Physical Exam: General: The patient is alert and oriented x3 in no acute distress.  Dermatology: Skin is warm, dry and supple bilateral lower extremities. Negative for open lesions or macerations.  Skin incisions have completely healed  Vascular: Palpable pedal pulses bilaterally. Capillary refill within normal limits.  Moderate edema noted to the surgical foot and ankle medial aspect  Neurological: Epicritic and protective threshold grossly intact bilaterally.   Musculoskeletal Exam: Pain on palpation noted to the posterior tibial tendon of the left foot as well as to the anterior, lateral and medial aspects of the left ankle. Range of motion within normal limits to all pedal and ankle joints bilateral. Muscle strength 5/5 in all groups bilateral.  There is pain with activation of the posterior tibial tendon.  MRI Impression 12/03/2018:  Intrasubstance increased T2 signal and fluid about the tibialis posterior tendon appear worse than on the prior examination and could be due to postoperative change but are worrisome for tendinosis and  interstitial/longitudinal split tearing.  Mild to moderate insertional Achilles tendinopathy without tear.  Plantar fasciitis without rupture. There is some reactive marrow edema in the calcaneus.  Mild, patchy marrow edema in the central and lateral periphery of the calcaneus may be due to stress change.  Assessment: 1. S/p PT tendon repair left. DOS: 09/22/2018 2. Pain and edema left ankle 3. Posterior tibial tendinitis left    Plan of Care:  1. Patient evaluated. MRI reviewed.  2. Unna boot reapplied.  3. Refill prescription for Percocet 10/325 mg provided to patient.  4. Prescription for Medrol Dose Pak provided to patient. 5. Return to clinic in one week.    Edrick Kins, DPM [facility]  Dr. Edrick Kins, DPM    [address], [place] [address]                Office [phone]  Fax [phone]

## 2018-12-13 ENCOUNTER — Other Ambulatory Visit: Payer: Self-pay

## 2018-12-13 ENCOUNTER — Encounter: Payer: Self-pay | Admitting: Podiatry

## 2018-12-13 ENCOUNTER — Ambulatory Visit (INDEPENDENT_AMBULATORY_CARE_PROVIDER_SITE_OTHER): Payer: Medicare Other | Admitting: Podiatry

## 2018-12-13 DIAGNOSIS — Z9889 Other specified postprocedural states: Secondary | ICD-10-CM

## 2018-12-13 DIAGNOSIS — M76822 Posterior tibial tendinitis, left leg: Secondary | ICD-10-CM | POA: Diagnosis not present

## 2018-12-13 MED ORDER — OXYCODONE-ACETAMINOPHEN 10-325 MG PO TABS: 1.0000 | ORAL_TABLET | Freq: Four times a day (QID) | ORAL | 0 refills | Status: DC | PRN

## 2018-12-15 NOTE — Progress Notes (Signed)
   HPI: 58 y.o. female presenting today status post posterior tibial tendon repair left. DOS: 09/22/2018. She states she is doing well overall. She reports continued pain but the swelling has decreased. She has been taking Percocet for treatment. There are no worsening factors noted. Patient is here for further evaluation and treatment.   Past Medical History:  Diagnosis Date  . Anemia   . Anxiety   . Arthritis   . Asthma    WELL CONTROLLED  . Bile acid malabsorption syndrome   . Bradycardia    HAD AN ISSUE WITH THIS IN 2016-NO PROBLEMS SINCE  . Depression   . Diabetes mellitus without complication ([place])   . Diverticulitis   . Gastric reflux   . GERD (gastroesophageal reflux disease)   . Hand pain 05/12/2016  . Headache    H/O MIGRAINES  . History of kidney stones    H/O  . Neck pain, chronic   . Panic attacks      Physical Exam: General: The patient is alert and oriented x3 in no acute distress.  Dermatology: Skin is warm, dry and supple bilateral lower extremities. Negative for open lesions or macerations.  Skin incisions have completely healed  Vascular: Palpable pedal pulses bilaterally. Capillary refill within normal limits.  Moderate edema noted to the surgical foot and ankle medial aspect  Neurological: Epicritic and protective threshold grossly intact bilaterally.   Musculoskeletal Exam: Pain on palpation noted to the posterior tibial tendon of the left foot as well as to the anterior, lateral and medial aspects of the left ankle. Range of motion within normal limits to all pedal and ankle joints bilateral. Muscle strength 5/5 in all groups bilateral.  There is pain with activation of the posterior tibial tendon.   Assessment: 1. S/p PT tendon repair left. DOS: 09/22/2018 2. Pain and edema left ankle 3. Posterior tibial tendinitis left    Plan of Care:  1. Patient evaluated.  2. Unna boot reapplied.  3. Refill prescription for Percocet 10/325 mg provided to  patient.  4. Authorization for knee scooter to be nonweightbearing in fiberglass cast if needed.  5. Injection of 0.5 mLs Celestone Soluspan injected into the posterior tibial tendon sheath.  6. Return to clinic in 3 weeks.     Edrick Kins, DPM [facility]  Dr. Edrick Kins, DPM    [address], [place] [address]                Office [phone]  Fax [phone]

## 2018-12-20 ENCOUNTER — Ambulatory Visit: Payer: Medicare Other | Admitting: Podiatry

## 2018-12-20 ENCOUNTER — Other Ambulatory Visit: Payer: Self-pay | Admitting: Podiatry

## 2018-12-20 MED ORDER — OXYCODONE-ACETAMINOPHEN 10-325 MG PO TABS: 1.0000 | ORAL_TABLET | Freq: Four times a day (QID) | ORAL | 0 refills | Status: AC | PRN

## 2018-12-20 NOTE — Progress Notes (Signed)
PRN postop pain 

## 2018-12-27 MED ORDER — OXYCODONE-ACETAMINOPHEN 10-325 MG PO TABS: 1.0000 | ORAL_TABLET | Freq: Four times a day (QID) | ORAL | 0 refills | Status: DC | PRN

## 2018-12-27 NOTE — Addendum Note (Signed)
Addended by: Boneta Lucks on: 12/27/2018 01:31 PM   Modules accepted: Orders

## 2019-01-03 ENCOUNTER — Other Ambulatory Visit: Payer: Self-pay

## 2019-01-03 ENCOUNTER — Encounter: Payer: Self-pay | Admitting: Podiatry

## 2019-01-03 ENCOUNTER — Ambulatory Visit (INDEPENDENT_AMBULATORY_CARE_PROVIDER_SITE_OTHER): Payer: Medicare Other | Admitting: Podiatry

## 2019-01-03 DIAGNOSIS — Z9889 Other specified postprocedural states: Secondary | ICD-10-CM

## 2019-01-03 DIAGNOSIS — M5416 Radiculopathy, lumbar region: Secondary | ICD-10-CM | POA: Diagnosis not present

## 2019-01-03 DIAGNOSIS — M76822 Posterior tibial tendinitis, left leg: Secondary | ICD-10-CM | POA: Diagnosis not present

## 2019-01-03 MED ORDER — OXYCODONE-ACETAMINOPHEN 10-325 MG PO TABS: 1.0000 | ORAL_TABLET | Freq: Four times a day (QID) | ORAL | 0 refills | Status: DC | PRN

## 2019-01-05 NOTE — Progress Notes (Signed)
   HPI: 58 y.o. female presenting today status post posterior tibial tendon repair left. DOS: 09/22/2018. She states she is still experiencing some soreness of the foot. She states she was unable to get the knee scooter due to cost. There are no modifying factors noted. She has been taking Percocet for pain. Patient is here for further evaluation and treatment.   Past Medical History:  Diagnosis Date  . Anemia   . Anxiety   . Arthritis   . Asthma    WELL CONTROLLED  . Bile acid malabsorption syndrome   . Bradycardia    HAD AN ISSUE WITH THIS IN 2016-NO PROBLEMS SINCE  . Depression   . Diabetes mellitus without complication ([place])   . Diverticulitis   . Gastric reflux   . GERD (gastroesophageal reflux disease)   . Hand pain 05/12/2016  . Headache    H/O MIGRAINES  . History of kidney stones    H/O  . Neck pain, chronic   . Panic attacks      Physical Exam: General: The patient is alert and oriented x3 in no acute distress.  Dermatology: Skin is warm, dry and supple bilateral lower extremities. Negative for open lesions or macerations.  Skin incisions have completely healed  Vascular: Palpable pedal pulses bilaterally. Capillary refill within normal limits.  Moderate edema noted to the surgical foot and ankle medial aspect  Neurological: Epicritic and protective threshold grossly intact bilaterally.   Musculoskeletal Exam: Pain on palpation noted to the posterior tibial tendon of the left foot as well as to the anterior, lateral and medial aspects of the left ankle. Range of motion within normal limits to all pedal and ankle joints bilateral. Muscle strength 5/5 in all groups bilateral.  There is pain with activation of the posterior tibial tendon.   Assessment: 1. S/p PT tendon repair left. DOS: 09/22/2018 2. Pain and edema left ankle 3. Posterior tibial tendinitis left  4. Lumbar radiculopathy LLE   Plan of Care:  1. Patient evaluated.  2. Unna boot reapplied.  3. Refill  prescription for Percocet 10/325 mg #30 provided to patient.  4. Patient could not afford knee scooter.  5. Patient has appointment with pain management on 02/08/2019.  6. Return to clinic in 6 weeks.      Edrick Kins, DPM [facility]  Dr. Edrick Kins, DPM    [address], [place] [address]                Office [phone]  Fax [phone]

## 2019-01-09 ENCOUNTER — Telehealth: Payer: Self-pay | Admitting: Podiatry

## 2019-01-09 NOTE — Telephone Encounter (Signed)
Pt called requesting refill of pain medication

## 2019-01-10 ENCOUNTER — Other Ambulatory Visit: Payer: Self-pay | Admitting: Podiatry

## 2019-01-10 MED ORDER — OXYCODONE-ACETAMINOPHEN 10-325 MG PO TABS: 1.0000 | ORAL_TABLET | Freq: Four times a day (QID) | ORAL | 0 refills | Status: DC | PRN

## 2019-01-10 NOTE — Progress Notes (Signed)
PRN postop pain 

## 2019-01-10 NOTE — Telephone Encounter (Signed)
Please send to pharmacy  Thanks

## 2019-01-16 ENCOUNTER — Telehealth: Payer: Self-pay | Admitting: Podiatry

## 2019-01-16 NOTE — Telephone Encounter (Signed)
Pt called to say that Dr.Evans wanted her to call to have her meds refilled [facility]

## 2019-01-17 ENCOUNTER — Telehealth: Payer: Self-pay | Admitting: Podiatry

## 2019-01-17 ENCOUNTER — Other Ambulatory Visit: Payer: Self-pay | Admitting: Podiatry

## 2019-01-17 MED ORDER — OXYCODONE-ACETAMINOPHEN 10-325 MG PO TABS: 1.0000 | ORAL_TABLET | Freq: Four times a day (QID) | ORAL | 0 refills | Status: DC | PRN

## 2019-01-17 NOTE — Telephone Encounter (Signed)
[place] pt wanted to make sure Dr. Amalia Hailey calls in her med refills [facility] [place]

## 2019-01-17 NOTE — Telephone Encounter (Signed)
I spoke with pt, and asked the characteristics of the pain she was having. Pt states she is having level 8 pain and the swelling went down and she states she had an appt with pain management 02/08/2019.

## 2019-01-17 NOTE — Telephone Encounter (Signed)
Rx sent. - Dr. Arijana Narayan

## 2019-01-17 NOTE — Progress Notes (Signed)
PRN pain 

## 2019-01-24 ENCOUNTER — Other Ambulatory Visit: Payer: Self-pay | Admitting: Podiatry

## 2019-01-24 ENCOUNTER — Telehealth: Payer: Self-pay

## 2019-01-24 MED ORDER — OXYCODONE-ACETAMINOPHEN 10-325 MG PO TABS: 1.0000 | ORAL_TABLET | Freq: Four times a day (QID) | ORAL | 0 refills | Status: DC | PRN

## 2019-01-24 NOTE — Telephone Encounter (Signed)
Rx sent. - Dr. Jadrian Bulman

## 2019-01-24 NOTE — Progress Notes (Signed)
PRN severe pain

## 2019-01-24 NOTE — Telephone Encounter (Signed)
Patient called request a refill of her pain medication today.  [facility]  Thanks

## 2019-01-30 ENCOUNTER — Telehealth: Payer: Self-pay

## 2019-01-30 NOTE — Telephone Encounter (Signed)
Patient called a day ahead to request her pain medication refill for tomorrow.  Please send to pharmacy if you approve.  Thanks

## 2019-01-31 ENCOUNTER — Other Ambulatory Visit: Payer: Self-pay | Admitting: Podiatry

## 2019-01-31 MED ORDER — OXYCODONE-ACETAMINOPHEN 10-325 MG PO TABS: 1.0000 | ORAL_TABLET | Freq: Four times a day (QID) | ORAL | 0 refills | Status: DC | PRN

## 2019-02-07 ENCOUNTER — Telehealth: Payer: Self-pay

## 2019-02-07 NOTE — Telephone Encounter (Signed)
Patient is calling for a refill of her pain medication to [facility].  Please send in if you approve.  Thanks

## 2019-02-08 ENCOUNTER — Other Ambulatory Visit: Payer: Self-pay | Admitting: Podiatry

## 2019-02-08 ENCOUNTER — Telehealth: Payer: Self-pay | Admitting: *Deleted

## 2019-02-08 MED ORDER — OXYCODONE-ACETAMINOPHEN 10-325 MG PO TABS: 1.0000 | ORAL_TABLET | Freq: Four times a day (QID) | ORAL | 0 refills | Status: DC | PRN

## 2019-02-08 NOTE — Progress Notes (Signed)
PRN postop pain 

## 2019-02-08 NOTE — Telephone Encounter (Signed)
Pt states she made multiple calls for pain medication and would like a pain medication.

## 2019-02-08 NOTE — Telephone Encounter (Signed)
Rx sent 

## 2019-02-14 ENCOUNTER — Encounter: Payer: Self-pay | Admitting: Podiatry

## 2019-02-14 ENCOUNTER — Ambulatory Visit (INDEPENDENT_AMBULATORY_CARE_PROVIDER_SITE_OTHER): Payer: Medicare Other | Admitting: Podiatry

## 2019-02-14 ENCOUNTER — Other Ambulatory Visit: Payer: Self-pay

## 2019-02-14 DIAGNOSIS — Z9889 Other specified postprocedural states: Secondary | ICD-10-CM

## 2019-02-14 DIAGNOSIS — M5416 Radiculopathy, lumbar region: Secondary | ICD-10-CM | POA: Diagnosis not present

## 2019-02-14 DIAGNOSIS — M76822 Posterior tibial tendinitis, left leg: Secondary | ICD-10-CM

## 2019-02-14 MED ORDER — DOXYCYCLINE HYCLATE 100 MG PO TABS: 100.0000 mg | ORAL_TABLET | Freq: Two times a day (BID) | ORAL | 0 refills | Status: DC

## 2019-02-14 MED ORDER — OXYCODONE-ACETAMINOPHEN 10-325 MG PO TABS: 1.0000 | ORAL_TABLET | Freq: Four times a day (QID) | ORAL | 0 refills | Status: DC | PRN

## 2019-02-17 NOTE — Progress Notes (Signed)
   HPI: 59 y.o. female presenting today status post posterior tibial tendon repair left. DOS: 09/22/2018. She is here for follow up evaluation of left foot and ankle pain and swelling. She reports no change in her symptoms and reports continued pain. She has been taking Percocet for treatment and has an appointment with pain management on 02/28/2019. There are no modifying factors noted. Patient is here for further evaluation and treatment.   Past Medical History:  Diagnosis Date  . Anemia   . Anxiety   . Arthritis   . Asthma    WELL CONTROLLED  . Bile acid malabsorption syndrome   . Bradycardia    HAD AN ISSUE WITH THIS IN 2016-NO PROBLEMS SINCE  . Depression   . Diabetes mellitus without complication ([place])   . Diverticulitis   . Gastric reflux   . GERD (gastroesophageal reflux disease)   . Hand pain 05/12/2016  . Headache    H/O MIGRAINES  . History of kidney stones    H/O  . Neck pain, chronic   . Panic attacks      Physical Exam: General: The patient is alert and oriented x3 in no acute distress.  Dermatology: Skin is warm, dry and supple bilateral lower extremities. Negative for open lesions or macerations.  Skin incisions have completely healed  Vascular: Palpable pedal pulses bilaterally. Capillary refill within normal limits.  Moderate edema noted to the surgical foot and ankle medial aspect  Neurological: Epicritic and protective threshold grossly intact bilaterally.   Musculoskeletal Exam: Pain on palpation noted to the posterior tibial tendon of the left foot as well as to the anterior, lateral and medial aspects of the left ankle. Range of motion within normal limits to all pedal and ankle joints bilateral. Muscle strength 5/5 in all groups bilateral.  There is pain with activation of the posterior tibial tendon.   Assessment: 1. S/p PT tendon repair left. DOS: 09/22/2018 2. Pain and edema left ankle 3. Posterior tibial tendinitis left  4. Lumbar radiculopathy  LLE   Plan of Care:  1. Patient evaluated.  2. Appointment with pain management on 02/27/2019. We will discontinue pain medication then. Appointment is with Dr. Mohammed Kindle, MD in [place]. 3. Refill prescription for Percocet 5/325 mg provided to patient.  4. Continue wearing good shoe gear.  5. Patient states physical therapy made symptoms worse so we will not pursue it at this time. Patient concerned about "heat" and possible infection so I will prescribe Doxycycline 100 mg #20.  6. Return to clinic as needed.     Edrick Kins, DPM [facility]  Dr. Edrick Kins, DPM    [address], [place] [address]                Office [phone]  Fax [phone]

## 2019-02-20 ENCOUNTER — Telehealth: Payer: Self-pay

## 2019-02-20 NOTE — Telephone Encounter (Signed)
Patient called requesting a refill on her pain medication.  Please send to [facility] if you approve.  Thanks!

## 2019-02-21 ENCOUNTER — Other Ambulatory Visit: Payer: Self-pay | Admitting: Podiatry

## 2019-02-21 ENCOUNTER — Telehealth: Payer: Self-pay | Admitting: *Deleted

## 2019-02-21 MED ORDER — OXYCODONE-ACETAMINOPHEN 10-325 MG PO TABS: 1.0000 | ORAL_TABLET | Freq: Four times a day (QID) | ORAL | 0 refills | Status: AC | PRN

## 2019-02-21 NOTE — Telephone Encounter (Signed)
I called pt and she states the other nurse had called and refilled the pain medication.

## 2019-02-21 NOTE — Telephone Encounter (Signed)
Pt called for refill of the pain medication.

## 2019-02-21 NOTE — Telephone Encounter (Signed)
Patient notified via voice mail that medication has been sent to pharmacy and per Dr. Amalia Hailey, this would be her last refill on pain medication before seeing pain clinic on 02/28/2019

## 2019-02-21 NOTE — Progress Notes (Signed)
PRN pain 

## 2019-02-21 NOTE — Telephone Encounter (Signed)
Rx sent 

## 2019-03-03 ENCOUNTER — Ambulatory Visit (INDEPENDENT_AMBULATORY_CARE_PROVIDER_SITE_OTHER): Payer: Medicare Other

## 2019-03-03 ENCOUNTER — Ambulatory Visit (INDEPENDENT_AMBULATORY_CARE_PROVIDER_SITE_OTHER): Payer: Medicare Other | Admitting: Podiatry

## 2019-03-03 ENCOUNTER — Other Ambulatory Visit: Payer: Self-pay

## 2019-03-03 DIAGNOSIS — M76822 Posterior tibial tendinitis, left leg: Secondary | ICD-10-CM

## 2019-03-03 DIAGNOSIS — R600 Localized edema: Secondary | ICD-10-CM

## 2019-03-13 NOTE — Progress Notes (Signed)
   HPI: 59 y.o. female presenting today status post posterior tibial tendon repair left. DOS: 09/22/2018. She is here for follow up evaluation of left foot and ankle pain and swelling. She reports no change in her symptoms and reports continued pain.  She continues to have pain with ambulation with been ongoing now for approximately 5-6 months.  The pain was immediate after surgery due to early weightbearing without the cam boot.  She continues to complain of some swelling to the area as well.  Past Medical History:  Diagnosis Date  . Anemia   . Anxiety   . Arthritis   . Asthma    WELL CONTROLLED  . Bile acid malabsorption syndrome   . Bradycardia    HAD AN ISSUE WITH THIS IN 2016-NO PROBLEMS SINCE  . Depression   . Diabetes mellitus without complication ([place])   . Diverticulitis   . Gastric reflux   . GERD (gastroesophageal reflux disease)   . Hand pain 05/12/2016  . Headache    H/O MIGRAINES  . History of kidney stones    H/O  . Neck pain, chronic   . Panic attacks      Physical Exam: General: The patient is alert and oriented x3 in no acute distress.  Dermatology: Skin is warm, dry and supple bilateral lower extremities. Negative for open lesions or macerations.  Skin incisions have completely healed  Vascular: Palpable pedal pulses bilaterally. Capillary refill within normal limits.  Moderate edema noted to the surgical foot and ankle medial aspect  Neurological: Epicritic and protective threshold grossly intact bilaterally.   Musculoskeletal Exam: Pain on palpation noted to the posterior tibial tendon of the left foot as well as to the anterior, lateral and medial aspects of the left ankle. Range of motion within normal limits to all pedal and ankle joints bilateral. Muscle strength 5/5 in all groups bilateral.  There is pain with activation of the posterior tibial tendon.   Assessment: 1. S/p PT tendon repair left. DOS: 09/22/2018 2. Pain and edema left ankle 3. Posterior  tibial tendinitis left  4. Lumbar radiculopathy LLE   Plan of Care:  1. Patient evaluated.  2.  Continue pain management 3.  Multilayer Unna boot compression wrap was applied to the surgical extremity.  Leave clean dry and intact x1 week 4.  At the moment the [facility] boot is the only modality that has helped the patient alleviate some of her symptoms. 5.  Return to clinic as needed if Unna boot needs to be reapplied   Edrick Kins, DPM [facility]  Dr. Edrick Kins, DPM    [address], [place] [address]                Office [phone]  Fax [phone]

## 2019-05-18 ENCOUNTER — Other Ambulatory Visit: Payer: Self-pay

## 2019-05-18 ENCOUNTER — Ambulatory Visit
Admission: EM | Admit: 2019-05-18 | Discharge: 2019-05-18 | Disposition: A | Discharge status: Home / Self Care | Payer: Medicare Other

## 2019-05-18 DIAGNOSIS — R5383 Other fatigue: Secondary | ICD-10-CM

## 2019-05-18 DIAGNOSIS — R112 Nausea with vomiting, unspecified: Secondary | ICD-10-CM | POA: Diagnosis not present

## 2019-05-18 DIAGNOSIS — R197 Diarrhea, unspecified: Secondary | ICD-10-CM

## 2019-05-18 DIAGNOSIS — A084 Viral intestinal infection, unspecified: Secondary | ICD-10-CM

## 2019-05-18 MED ORDER — ONDANSETRON 4 MG PO TBDP: 4.0000 mg | ORAL_TABLET | Freq: Three times a day (TID) | ORAL | 0 refills | Status: DC | PRN

## 2019-05-18 NOTE — Discharge Instructions (Signed)
It was very nice seeing you today in clinic. Thank you for entrusting me with your care.   Rest and Stay HYDRATED. Water and electrolyte containing beverages (Gatorade, Pedialyte) are best to prevent dehydration and electrolyte abnormalities.  Use Zofran as needed for nausea. Avoid anti-diarrheals.   Make arrangements to follow up with your regular doctor in 1 week for re-evaluation if not improving. If your symptoms/condition worsens, please seek follow up care either here or in the ER. Please remember, our [place] providers are "right here with you" when you need [place].   Again, it was my pleasure to take care of you today. Thank you for choosing our clinic. I hope that you start to feel better quickly.   [name], MSN, APRN, FNP-C, CEN Advanced Practice Provider [facility]

## 2019-05-18 NOTE — ED Triage Notes (Addendum)
Pt presents with c/o diarrhea, stomach/abd cramps, nausea and emesis since Monday. Pt did have a normal BM this morning. Pt reports continued nausea, fatigue and weakness. Pt fever/chills, body aches, cough, nasal congestion or other symptoms. 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 Pt is tested weekly (Mondays) for COVID, test this week was NEG.

## 2019-05-19 NOTE — ED Provider Notes (Signed)
[place], [place]   Name: Vanessa Fisher DOB: 07/27/1960 MRN: [id number] CSN: [id number] PCP: Lorelee Market, MD  Arrival date and time:  05/18/19 1149  Chief Complaint:  Nausea  NOTE: Prior to seeing the patient today, I have reviewed the triage nursing documentation and vital signs. Clinical staff has updated patient's PMH/PSHx, current medication list, and drug allergies/intolerances to ensure comprehensive history available to assist in medical decision making.   History:   HPI: Vanessa Fisher is a 59 y.o. female who presents today with complaints of abdominal cramping that began on Monday (05/15/2019) of this week. Patient has had associated nausea, vomiting, and diarrhea. She denies any associated fevers, chills, or abdominal pain. No urinary symptoms. She presents today with reports of being fatigued and "just give out". 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 She is tested for SARS-CoV-2 (novel coronavirus) weekly at work on Mondays; her test on 05/15/2019 was negative and patient requests to defer retesting today. Patient reports that her appetite has been decreased overall, however she remains able to drink normally. Despite her symptoms, patient has not taken any over the counter interventions to help improve/relieve her reported symptoms at home.   Past Medical History:  Diagnosis Date  . Anemia   . Anxiety   . Arthritis   . Asthma    WELL CONTROLLED  . Bile acid malabsorption syndrome   . Bradycardia    HAD AN ISSUE WITH THIS IN 2016-NO PROBLEMS SINCE  . Depression   . Diabetes mellitus without complication ([place])   . Diverticulitis   . Gastric reflux   . GERD (gastroesophageal reflux disease)   . Hand pain 05/12/2016  . Headache    H/O MIGRAINES  . History of kidney stones    H/O  . Neck pain, chronic   . Panic attacks     Past Surgical History:  Procedure  Laterality Date  . CARPAL TUNNEL RELEASE Right 12/06/2017   Procedure: CARPAL TUNNEL RELEASE;  Surgeon: Earnestine Leys, MD;  Location: [facility] ORS;  Service: Orthopedics;  Laterality: Right;  . DORSAL COMPARTMENT RELEASE Right 12/06/2017   Procedure: RELEASE DORSAL COMPARTMENT (DEQUERVAIN);  Surgeon: Earnestine Leys, MD;  Location: [facility] ORS;  Service: Orthopedics;  Laterality: Right;  . PARTIAL HYSTERECTOMY    . TUBAL LIGATION      Family History  Problem Relation Age of Onset  . Diabetes Mother   . Hypertension Mother   . Heart failure Mother   . Cancer Mother   . Diabetes Father   . Hypertension Father     Social History   Tobacco Use  . Smoking status: Never Smoker  . Smokeless tobacco: Never Used  Substance Use Topics  . Alcohol use: No  . Drug use: No    Patient Active Problem List   Diagnosis Date Noted  . Carpal tunnel syndrome of right wrist 12/06/2017  . Radial styloid tenosynovitis 08/11/2017  . Lumbar facet osteoarthritis 11/10/2016  . Chronic musculoskeletal pain 11/05/2016  . Lumbar radiculitis (Right) (L4) 09/28/2016  . Vitamin D insufficiency 07/30/2016  . Chronic pain syndrome 07/08/2016  . Long term (current) use of opiate analgesic 07/08/2016  . Long term prescription opiate use 07/08/2016  . Opiate use (30 MME/day) 07/08/2016  . Chronic low back pain (Primary Source of Pain) (midline) ( (Bilateral) (L>R) 07/08/2016  . Disturbance of skin sensation 07/08/2016  . Chronic  neck pain (Secondary source of pain) (midline) (Bilateral) (L>R) 07/08/2016  . DDD (degenerative disc disease), cervical 07/08/2016  . DDD (degenerative disc disease), lumbar 07/08/2016  . Cervical spondylosis 07/08/2016  . Cervical radicular pain (Right) (C7/C8) 07/08/2016  . Upper extremity numbness (Right) 07/08/2016  . Cervical facet syndrome (Bilateral) (L>R) 07/08/2016  . Osteoarthritis of knee (Bilateral) (R>L) 07/08/2016  . Chronic lower extremity pain (Right) 07/08/2016  .  Lumbar facet syndrome (Bilateral) (L>R) 07/08/2016  . Grade 1 Anterolisthesis of L4 over L5 (3 mm) 07/08/2016  . Grade 1 Retrolisthesis of L5 over S1 (5 mm) 07/08/2016  . Chronic knee pain [facility] source of pain) (Bilateral) (R>L) 05/12/2016    Home Medications:    Current Meds  Medication Sig  . cyclobenzaprine (FLEXERIL) 10 MG tablet Take 10 mg by mouth 2 (two) times daily as needed.  . fluticasone (FLONASE) 50 MCG/ACT nasal spray Place 2 sprays into both nostrils daily.   Marland Kitchen glimepiride (AMARYL) 2 MG tablet Take 2 mg by mouth daily.  Marland Kitchen ibuprofen (ADVIL) 800 MG tablet Take 800 mg by mouth 2 (two) times daily as needed.  Marland Kitchen lisinopril (ZESTRIL) 10 MG tablet   . oxyCODONE-acetaminophen (PERCOCET) 10-325 MG tablet Take 1 tablet by mouth 3 (three) times daily as needed.  . pantoprazole (PROTONIX) 40 MG tablet Take 40 mg by mouth daily.  Marland Kitchen triamcinolone cream (KENALOG) 0.1 % Apply 1 application topically 2 (two) times daily as needed.    Allergies:   Bc powder [aspirin-salicylamide-caffeine], Gabapentin, Hydrocodone-acetaminophen, Latex, Penicillin g, Penicillins, and Shrimp [shellfish allergy]  Review of Systems (ROS):  Review of systems NEGATIVE unless otherwise noted in narrative H&P section.   Vital Signs: Today's Vitals   05/18/19 1209 05/18/19 1213 05/18/19 1238  BP:  (!) 123/47   Pulse:  62   Temp:  98.1 F (36.7 C)   TempSrc:  Oral   SpO2:  100%   Weight: 230 lb (104.3 kg)    Height: 5\' 7"  (1.702 m)    PainSc: 0-No pain  0-No pain    Physical Exam: Physical Exam  Constitutional: She is well-developed, well-nourished, and in no distress.  Appears fatigued. CAO x 4.   HENT:  Head: Normocephalic and atraumatic.  Eyes: Pupils are equal, round, and reactive to light.  Neck: No tracheal deviation present.  Cardiovascular: Normal rate, regular rhythm, normal heart sounds and intact distal pulses.  Pulmonary/Chest: Effort normal and breath sounds normal.  Abdominal:  Soft. Normal appearance and bowel sounds are normal. She exhibits no distension. There is no abdominal tenderness.  Musculoskeletal:     Cervical back: Normal range of motion and neck supple.  Lymphadenopathy:    She has no cervical adenopathy.  Neurological: She is alert. Gait normal.  Skin: Skin is warm and dry. No rash noted. She is not diaphoretic.  Psychiatric: Mood, memory, affect and judgment normal.  Nursing note and vitals reviewed.   Urgent Care Treatments / Results:   No orders of the defined types were placed in this encounter.   LABS: PLEASE NOTE: all labs that were ordered this encounter are listed, however only abnormal results are displayed. Labs Reviewed - No data to display  EKG: -None  RADIOLOGY: No results found.  PROCEDURES: Procedures  MEDICATIONS RECEIVED THIS VISIT: Medications - No data to display  PERTINENT CLINICAL COURSE NOTES/UPDATES:   Initial Impression / Assessment and Plan / Urgent Care Course:  Pertinent labs & imaging results that were available during my care of the patient  were personally reviewed by me and considered in my medical decision making (see lab/imaging section of note for values and interpretations).  Vanessa Fisher is a 59 y.o. female who presents to [facility] today with complaints of Nausea  Patient is fatigued appearing overall in clinic today. She does not appear to be in any acute distress. Presenting symptoms (see HPI) and exam as documented above. Exam is reassuring. No focal abdominal pain or fevers. VSS; no tachycardia or hypotension. Recent SARS-CoV-2 testing negative; wishes to defer retesting today. Able to eat and drink at home. (+) recent exposure to resident at work with GI symptoms. Patient felt to have AVGE. Discussed symptomatic care at home. Patient to rest and increase fluid intake as much as possible. Encouraged ORS to prevent dehydration and electrolyte derangements. May use Tylenol and/or  Ibuprofen as needed for pain. Will send in prescription for a supply of ondansetron for patient to use on a PRN basis for her nausea.   Current clinical condition warrants patient being out of work in order to recover from her current illness. She was provided with the appropriate documentation to provide to her place of employment that will allow for her to RTW on 05/22/2019 with no restrictions.   Discussed follow up with primary care physician in 1 week for re-evaluation. I have reviewed the follow up and strict return precautions for any new or worsening symptoms. Patient is aware of symptoms that would be deemed urgent/emergent, and would thus require further evaluation either here or in the emergency department. At the time of discharge, she verbalized understanding and consent with the discharge plan as it was reviewed with her. All questions were fielded by provider and/or clinic staff prior to patient discharge.    Final Clinical Impressions / Urgent Care Diagnoses:   Final diagnoses:  Viral gastroenteritis  Fatigue, unspecified type    New Prescriptions:  [place] Controlled Substance Registry consulted? Not Applicable  Meds ordered this encounter  Medications  . ondansetron (ZOFRAN-ODT) 4 MG disintegrating tablet    Sig: Take 1 tablet (4 mg total) by mouth every 8 (eight) hours as needed.    Dispense:  15 tablet    Refill:  0    Recommended Follow up Care:  Patient encouraged to follow up with the following provider within the specified time frame, or sooner as dictated by the severity of her symptoms. As always, she was instructed that for any urgent/emergent care needs, she should seek care either here or in the emergency department for more immediate evaluation.  Follow-up Information    Lorelee Market, MD In 1 week.   Specialty: Family Medicine Why: General reassessment of symptoms if not improving Contact information: [place]  [id number] [phone]         NOTE: This note was prepared using Dragon dictation software along with smaller phrase technology. Despite my best ability to proofread, there is the potential that transcriptional errors may still occur from this process, and are completely unintentional.    Karen Kitchens, NP 05/19/19 2132

## 2019-06-27 ENCOUNTER — Ambulatory Visit: Payer: Medicare Other | Admitting: Podiatry

## 2019-07-07 ENCOUNTER — Emergency Department
Admission: EM | Admit: 2019-07-07 | Discharge: 2019-07-07 | Disposition: A | Discharge status: Home / Self Care | Payer: Medicare Other | Attending: Emergency Medicine | Admitting: Emergency Medicine

## 2019-07-07 ENCOUNTER — Other Ambulatory Visit: Payer: Self-pay

## 2019-07-07 ENCOUNTER — Emergency Department: Payer: Medicare Other

## 2019-07-07 DIAGNOSIS — Y939 Activity, unspecified: Secondary | ICD-10-CM | POA: Diagnosis not present

## 2019-07-07 DIAGNOSIS — J45909 Unspecified asthma, uncomplicated: Secondary | ICD-10-CM | POA: Insufficient documentation

## 2019-07-07 DIAGNOSIS — Y929 Unspecified place or not applicable: Secondary | ICD-10-CM | POA: Insufficient documentation

## 2019-07-07 DIAGNOSIS — M533 Sacrococcygeal disorders, not elsewhere classified: Secondary | ICD-10-CM | POA: Diagnosis present

## 2019-07-07 DIAGNOSIS — W19XXXA Unspecified fall, initial encounter: Secondary | ICD-10-CM | POA: Insufficient documentation

## 2019-07-07 DIAGNOSIS — E119 Type 2 diabetes mellitus without complications: Secondary | ICD-10-CM | POA: Insufficient documentation

## 2019-07-07 DIAGNOSIS — S3992XA Unspecified injury of lower back, initial encounter: Secondary | ICD-10-CM | POA: Diagnosis not present

## 2019-07-07 DIAGNOSIS — Y999 Unspecified external cause status: Secondary | ICD-10-CM | POA: Insufficient documentation

## 2019-07-07 MED ORDER — KETOROLAC TROMETHAMINE 30 MG/ML IJ SOLN
30.0000 mg | Freq: Once | INTRAMUSCULAR | Status: AC
  Administered 2019-07-07: 30 mg via INTRAMUSCULAR
  Filled 2019-07-07: qty 1

## 2019-07-07 MED ORDER — KETOROLAC TROMETHAMINE 10 MG PO TABS
10.0000 mg | ORAL_TABLET | Freq: Four times a day (QID) | ORAL | 0 refills | Status: DC | PRN
Start: 2019-07-07 — End: 2019-07-07

## 2019-07-07 NOTE — ED Triage Notes (Signed)
Pt states she fell a couple of days ago and is having tailbone pain.

## 2019-07-07 NOTE — ED Provider Notes (Signed)
[facility] Emergency Department Provider Note  ____________________________________________  Time seen: Approximately 12:14 PM  I have reviewed the triage vital signs and the nursing notes.   HISTORY  Chief Complaint Tailbone Pain    HPI Vanessa Fisher is a 59 y.o. female that presents to the emergency department for evaluation of tailbone pain after a fall 2 days ago.  Patient was cleaning carpets, which caused her to fall.  She landed on her buttocks.  She did not hit her head or lose consciousness.  She has been having tailbone pain since fall.  She is having difficulty sitting and walking due to the pain.  No additional injuries.   Past Medical History:  Diagnosis Date  . Anemia   . Anxiety   . Arthritis   . Asthma    WELL CONTROLLED  . Bile acid malabsorption syndrome   . Bradycardia    HAD AN ISSUE WITH THIS IN 2016-NO PROBLEMS SINCE  . Depression   . Diabetes mellitus without complication ([place])   . Diverticulitis   . Gastric reflux   . GERD (gastroesophageal reflux disease)   . Hand pain 05/12/2016  . Headache    H/O MIGRAINES  . History of kidney stones    H/O  . Neck pain, chronic   . Panic attacks     Patient Active Problem List   Diagnosis Date Noted  . Carpal tunnel syndrome of right wrist 12/06/2017  . Radial styloid tenosynovitis 08/11/2017  . Lumbar facet osteoarthritis 11/10/2016  . Chronic musculoskeletal pain 11/05/2016  . Lumbar radiculitis (Right) (L4) 09/28/2016  . Vitamin D insufficiency 07/30/2016  . Chronic pain syndrome 07/08/2016  . Long term (current) use of opiate analgesic 07/08/2016  . Long term prescription opiate use 07/08/2016  . Opiate use (30 MME/day) 07/08/2016  . Chronic low back pain (Primary Source of Pain) (midline) ( (Bilateral) (L>R) 07/08/2016  . Disturbance of skin sensation 07/08/2016  . Chronic neck pain (Secondary source of pain) (midline) (Bilateral) (L>R) 07/08/2016  . DDD  (degenerative disc disease), cervical 07/08/2016  . DDD (degenerative disc disease), lumbar 07/08/2016  . Cervical spondylosis 07/08/2016  . Cervical radicular pain (Right) (C7/C8) 07/08/2016  . Upper extremity numbness (Right) 07/08/2016  . Cervical facet syndrome (Bilateral) (L>R) 07/08/2016  . Osteoarthritis of knee (Bilateral) (R>L) 07/08/2016  . Chronic lower extremity pain (Right) 07/08/2016  . Lumbar facet syndrome (Bilateral) (L>R) 07/08/2016  . Grade 1 Anterolisthesis of L4 over L5 (3 mm) 07/08/2016  . Grade 1 Retrolisthesis of L5 over S1 (5 mm) 07/08/2016  . Chronic knee pain [facility] source of pain) (Bilateral) (R>L) 05/12/2016    Past Surgical History:  Procedure Laterality Date  . CARPAL TUNNEL RELEASE Right 12/06/2017   Procedure: CARPAL TUNNEL RELEASE;  Surgeon: Earnestine Leys, MD;  Location: [facility] ORS;  Service: Orthopedics;  Laterality: Right;  . DORSAL COMPARTMENT RELEASE Right 12/06/2017   Procedure: RELEASE DORSAL COMPARTMENT (DEQUERVAIN);  Surgeon: Earnestine Leys, MD;  Location: [facility] ORS;  Service: Orthopedics;  Laterality: Right;  . PARTIAL HYSTERECTOMY    . TUBAL LIGATION      Prior to Admission medications   Medication Sig Start Date End Date Taking? Authorizing Provider  cyclobenzaprine (FLEXERIL) 10 MG tablet Take 10 mg by mouth 2 (two) times daily as needed. 12/26/18   [provider]  fluticasone (FLONASE) 50 MCG/ACT nasal spray Place 2 sprays into both nostrils daily.     [provider]  glimepiride (AMARYL) 2 MG tablet Take 2  mg by mouth daily. 07/03/17   [provider]  ibuprofen (ADVIL) 800 MG tablet Take 800 mg by mouth 2 (two) times daily as needed. 03/15/19   [provider]  lisinopril (ZESTRIL) 10 MG tablet  10/10/18   [provider]  ondansetron (ZOFRAN-ODT) 4 MG disintegrating tablet Take 1 tablet (4 mg total) by mouth every 8 (eight) hours as needed. 05/18/19   Karen Kitchens, NP  oxyCODONE-acetaminophen  (PERCOCET) 10-325 MG tablet Take 1 tablet by mouth 3 (three) times daily as needed. 05/12/19   [provider]  pantoprazole (PROTONIX) 40 MG tablet Take 40 mg by mouth daily. 02/21/19   [provider]  triamcinolone cream (KENALOG) 0.1 % Apply 1 application topically 2 (two) times daily as needed. 08/09/18   Karen Kitchens, NP  albuterol (PROVENTIL HFA;VENTOLIN HFA) 108 (90 BASE) MCG/ACT inhaler Inhale 2 puffs into the lungs every 6 (six) hours as needed for wheezing or shortness of breath.  11/06/18  [provider]  atorvastatin (LIPITOR) 10 MG tablet Take 10 mg by mouth at bedtime.   11/06/18  [provider]  DULoxetine (CYMBALTA) 20 MG capsule  08/10/18 05/18/19  [provider]  gabapentin (NEURONTIN) 400 MG capsule Take 1 capsule (400 mg total) by mouth 2 (two) times daily. 12/06/17 07/27/18  Earnestine Leys, MD  montelukast (SINGULAIR) 10 MG tablet Take 10 mg by mouth as needed.   07/27/18  [provider]    Allergies Bc powder [aspirin-salicylamide-caffeine], Gabapentin, Hydrocodone-acetaminophen, Latex, Penicillin g, Penicillins, and Shrimp [shellfish allergy]  Family History  Problem Relation Age of Onset  . Diabetes Mother   . Hypertension Mother   . Heart failure Mother   . Cancer Mother   . Diabetes Father   . Hypertension Father     Social History Social History   Tobacco Use  . Smoking status: Never Smoker  . Smokeless tobacco: Never Used  Substance Use Topics  . Alcohol use: No  . Drug use: No     Review of Systems  Cardiovascular: No chest pain. Respiratory: No SOB. Gastrointestinal: No abdominal pain.  No nausea, no vomiting.  Musculoskeletal: Positive for tailbone pain. Skin: Negative for rash, abrasions, lacerations, ecchymosis. Neurological: Negative for headaches   ____________________________________________   PHYSICAL EXAM:  VITAL SIGNS: ED Triage Vitals  Enc Vitals Group     BP 07/07/19 1118 (!)  111/49     Pulse Rate 07/07/19 1118 64     Resp 07/07/19 1118 16     Temp 07/07/19 1118 98 F (36.7 C)     Temp Source 07/07/19 1118 Oral     SpO2 07/07/19 1118 100 %     Weight 07/07/19 1119 230 lb (104.3 kg)     Height 07/07/19 1119 5\' 7"  (1.702 m)     Head Circumference --      Peak Flow --      Pain Score 07/07/19 1119 10     Pain Loc --      Pain Edu? --      Excl. in [place]? --      Constitutional: Alert and oriented. Well appearing and in no acute distress. Eyes: Conjunctivae are normal. PERRL. EOMI. Head: Atraumatic. ENT:      Ears:      Nose: No congestion/rhinnorhea.      Mouth/Throat: Mucous membranes are moist.  Neck: No stridor.  Cardiovascular: Normal rate, regular rhythm.  Good peripheral circulation. Respiratory: Normal respiratory effort without tachypnea or retractions.  Lungs CTAB. Good air entry to the bases with no decreased or absent breath sounds. *Gastrointestinal: Bowel sounds 4 quadrants. Soft and nontender to palpation. No guarding or rigidity. No palpable masses. No distention.  Musculoskeletal: Full range of motion to all extremities. No gross deformities appreciated.  Tenderness to palpation to tailbone.  No tenderness to palpation over lumbar spine.  Able to move herself around in bed and sit up without assistance.  Weightbearing without assistance.  Antalgic gait. Neurologic:  Normal speech and language. No gross focal neurologic deficits are appreciated.  Skin:  Skin is warm, dry and intact. No rash noted. Psychiatric: Mood and affect are normal. Speech and behavior are normal. Patient exhibits appropriate insight and judgement.   ____________________________________________   LABS (all labs ordered are listed, but only abnormal results are displayed)  Labs Reviewed - No data to display ____________________________________________  EKG   ____________________________________________  RADIOLOGY Robinette Haines, personally viewed and  evaluated these images (plain radiographs) as part of my medical decision making, as well as reviewing the written report by the radiologist.  DG Sacrum/Coccyx  Result Date: 07/07/2019 CLINICAL DATA:  Acute tail bone pain after fall several days ago. EXAM: SACRUM AND COCCYX - 2+ VIEW COMPARISON:  None. FINDINGS: There is no evidence of fracture or other focal bone lesions. IMPRESSION: Negative. Electronically Signed   By: Marijo Conception M.D.   On: 07/07/2019 13:02    ____________________________________________    PROCEDURES  Procedure(s) performed:    Procedures    Medications  ketorolac (TORADOL) 30 MG/ML injection 30 mg (30 mg Intramuscular Given 07/07/19 1349)     ____________________________________________   INITIAL IMPRESSION / ASSESSMENT AND PLAN / ED COURSE  Pertinent labs & imaging results that were available during my care of the patient were reviewed by me and considered in my medical decision making (see chart for details).  Review of the [place] CSRS was performed in accordance of the [place] prior to dispensing any controlled drugs.   Patient presented to the emergency department for evaluation of tailbone injury. Vital signs and exam are reassuring. X-ray negative for acute bony abnormalities. Patient left after receiving Toradol injection.  Vanessa Fisher was evaluated in Emergency Department on 07/07/2019 for the symptoms described in the history of present illness. She was evaluated in the context of the global COVID-19 pandemic, which necessitated consideration that the patient might be at risk for infection with the SARS-CoV-2 virus that causes COVID-19. Institutional protocols and algorithms that pertain to the evaluation of patients at risk for COVID-19 are in a state of rapid change based on information released by regulatory bodies including the CDC and federal and state organizations. These policies and algorithms were followed during the patient's care in the  ED.   ____________________________________________  FINAL CLINICAL IMPRESSION(S) / ED DIAGNOSES  Final diagnoses:  Injury of coccyx, initial encounter      NEW MEDICATIONS STARTED DURING THIS VISIT:  ED Discharge Orders         Ordered    ketorolac (TORADOL) 10 MG tablet  Every 6 hours PRN,   Status:  Discontinued     07/07/19 1406              This chart was dictated using voice recognition software/Dragon. Despite best efforts to proofread, errors can occur which can change the meaning. Any change was purely unintentional.    Laban Emperor, PA-C 07/07/19 1531    Arta Silence, MD 07/07/19 [phone]

## 2019-07-07 NOTE — ED Notes (Signed)
Patient left without discharge paperwork.  Patient told EDT she needed to leave to pick up her granddaughter.  Patient ambulatory per EDT from ED.  Provider aware.

## 2019-07-07 NOTE — ED Notes (Signed)
See triage note; pt fell 2 days ago and is having increased pain in her tailbone area. Pt denies loc or head injury. Pt alert & oriented, nad noted.

## 2019-07-08 ENCOUNTER — Emergency Department
Admission: EM | Admit: 2019-07-08 | Discharge: 2019-07-08 | Discharge status: Against Medical Advice | Payer: Medicare Other

## 2019-07-19 ENCOUNTER — Emergency Department: Payer: No Typology Code available for payment source

## 2019-07-19 ENCOUNTER — Emergency Department
Admission: EM | Admit: 2019-07-19 | Discharge: 2019-07-19 | Disposition: A | Discharge status: Home / Self Care | Payer: No Typology Code available for payment source | Attending: Emergency Medicine | Admitting: Emergency Medicine

## 2019-07-19 ENCOUNTER — Other Ambulatory Visit: Payer: Self-pay

## 2019-07-19 DIAGNOSIS — M545 Low back pain: Secondary | ICD-10-CM | POA: Insufficient documentation

## 2019-07-19 DIAGNOSIS — R0789 Other chest pain: Secondary | ICD-10-CM | POA: Insufficient documentation

## 2019-07-19 MED ORDER — KETOROLAC TROMETHAMINE 30 MG/ML IJ SOLN
30.0000 mg | Freq: Once | INTRAMUSCULAR | Status: AC
  Administered 2019-07-19: 30 mg via INTRAMUSCULAR
  Filled 2019-07-19: qty 1

## 2019-07-19 MED ORDER — CYCLOBENZAPRINE HCL 10 MG PO TABS
10.0000 mg | ORAL_TABLET | Freq: Three times a day (TID) | ORAL | 0 refills | Status: DC | PRN
Start: 2019-07-19 — End: 2020-01-18

## 2019-07-19 MED ORDER — METHYLPREDNISOLONE 4 MG PO TBPK
ORAL_TABLET | ORAL | 0 refills | Status: DC
Start: 2019-07-19 — End: 2020-01-18

## 2019-07-19 NOTE — ED Notes (Signed)
Pt transported to xray 
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
## 2019-09-02 ENCOUNTER — Encounter: Payer: Self-pay | Admitting: Emergency Medicine

## 2019-09-02 ENCOUNTER — Emergency Department
Admission: EM | Admit: 2019-09-02 | Discharge: 2019-09-02 | Disposition: A | Discharge status: Home / Self Care | Payer: Medicare Other | Attending: Emergency Medicine | Admitting: Emergency Medicine

## 2019-09-02 ENCOUNTER — Emergency Department: Payer: Medicare Other

## 2019-09-02 ENCOUNTER — Other Ambulatory Visit: Payer: Self-pay

## 2019-09-02 DIAGNOSIS — Z91040 Latex allergy status: Secondary | ICD-10-CM | POA: Insufficient documentation

## 2019-09-02 DIAGNOSIS — E119 Type 2 diabetes mellitus without complications: Secondary | ICD-10-CM | POA: Diagnosis not present

## 2019-09-02 DIAGNOSIS — Y929 Unspecified place or not applicable: Secondary | ICD-10-CM | POA: Diagnosis not present

## 2019-09-02 DIAGNOSIS — S91342A Puncture wound with foreign body, left foot, initial encounter: Secondary | ICD-10-CM | POA: Insufficient documentation

## 2019-09-02 DIAGNOSIS — M795 Residual foreign body in soft tissue: Secondary | ICD-10-CM | POA: Diagnosis not present

## 2019-09-02 DIAGNOSIS — Z23 Encounter for immunization: Secondary | ICD-10-CM | POA: Diagnosis not present

## 2019-09-02 DIAGNOSIS — Z79899 Other long term (current) drug therapy: Secondary | ICD-10-CM | POA: Insufficient documentation

## 2019-09-02 DIAGNOSIS — Y939 Activity, unspecified: Secondary | ICD-10-CM | POA: Diagnosis not present

## 2019-09-02 DIAGNOSIS — W1842XA Slipping, tripping and stumbling without falling due to stepping into hole or opening, initial encounter: Secondary | ICD-10-CM | POA: Diagnosis not present

## 2019-09-02 DIAGNOSIS — S99922A Unspecified injury of left foot, initial encounter: Secondary | ICD-10-CM | POA: Diagnosis present

## 2019-09-02 DIAGNOSIS — J45909 Unspecified asthma, uncomplicated: Secondary | ICD-10-CM | POA: Insufficient documentation

## 2019-09-02 DIAGNOSIS — T148XXA Other injury of unspecified body region, initial encounter: Secondary | ICD-10-CM

## 2019-09-02 DIAGNOSIS — Y999 Unspecified external cause status: Secondary | ICD-10-CM | POA: Insufficient documentation

## 2019-09-02 MED ORDER — ONDANSETRON 4 MG PO TBDP
4.0000 mg | ORAL_TABLET | Freq: Once | ORAL | Status: AC
  Administered 2019-09-02: 4 mg via ORAL
  Filled 2019-09-02: qty 1

## 2019-09-02 MED ORDER — TETANUS-DIPHTH-ACELL PERTUSSIS 5-2.5-18.5 LF-MCG/0.5 IM SUSP
0.5000 mL | Freq: Once | INTRAMUSCULAR | Status: AC
  Administered 2019-09-02: 0.5 mL via INTRAMUSCULAR
  Filled 2019-09-02: qty 0.5

## 2019-09-02 MED ORDER — FENTANYL CITRATE (PF) 100 MCG/2ML IJ SOLN
50.0000 ug | Freq: Once | INTRAMUSCULAR | Status: DC
  Filled 2019-09-02: qty 2

## 2019-09-02 MED ORDER — ONDANSETRON 4 MG PO TBDP
4.0000 mg | ORAL_TABLET | Freq: Once | ORAL | Status: DC
  Filled 2019-09-02: qty 1

## 2019-09-02 MED ORDER — LIDOCAINE HCL 1 % IJ SOLN
5.0000 mL | Freq: Once | INTRAMUSCULAR | Status: DC
  Filled 2019-09-02: qty 10

## 2019-09-02 MED ORDER — HYDROCODONE-ACETAMINOPHEN 5-325 MG PO TABS
1.0000 | ORAL_TABLET | Freq: Once | ORAL | Status: AC
  Administered 2019-09-02: 1 via ORAL
  Filled 2019-09-02: qty 1

## 2019-09-02 MED ORDER — LEVOFLOXACIN 750 MG PO TABS
750.0000 mg | ORAL_TABLET | Freq: Every day | ORAL | 0 refills | Status: AC
Start: 2019-09-02 — End: 2019-09-07

## 2019-09-02 MED ORDER — TETANUS-DIPHTH-ACELL PERTUSSIS 5-2.5-18.5 LF-MCG/0.5 IM SUSP: 0.5000 mL | Freq: Once | INTRAMUSCULAR | Status: DC

## 2019-09-02 NOTE — Discharge Instructions (Signed)
Take Levaquin once a day for seven days.

## 2019-09-02 NOTE — ED Triage Notes (Signed)
Pt presents to ED via POV with c/o nail to L foot. Pt states stepped nail earlier today, unknown last tetanus.

## 2019-09-02 NOTE — ED Provider Notes (Signed)
Emergency Department Provider Note  ____________________________________________  Time seen: Approximately 7:51 PM  I have reviewed the triage vital signs and the nursing notes.   HISTORY  Chief Complaint Foreign Body in [place] Patient   HPI Vanessa Fisher is a 59 y.o. female presents to the emergency department with a soft tissue foreign body.  Patient states that she stepped on a thumbtack.  Patient was able to remove some tach in the emergency department.  She states that she would still like something for pain.  No numbness or tingling of the left foot.  Past Medical History:  Diagnosis Date  . Anemia   . Anxiety   . Arthritis   . Asthma    WELL CONTROLLED  . Bile acid malabsorption syndrome   . Bradycardia    HAD AN ISSUE WITH THIS IN 2016-NO PROBLEMS SINCE  . Depression   . Diabetes mellitus without complication ([place])   . Diverticulitis   . Gastric reflux   . GERD (gastroesophageal reflux disease)   . Hand pain 05/12/2016  . Headache    H/O MIGRAINES  . History of kidney stones    H/O  . Neck pain, chronic   . Panic attacks      Immunizations up to date:  Yes.     Past Medical History:  Diagnosis Date  . Anemia   . Anxiety   . Arthritis   . Asthma    WELL CONTROLLED  . Bile acid malabsorption syndrome   . Bradycardia    HAD AN ISSUE WITH THIS IN 2016-NO PROBLEMS SINCE  . Depression   . Diabetes mellitus without complication ([place])   . Diverticulitis   . Gastric reflux   . GERD (gastroesophageal reflux disease)   . Hand pain 05/12/2016  . Headache    H/O MIGRAINES  . History of kidney stones    H/O  . Neck pain, chronic   . Panic attacks     Patient Active Problem List   Diagnosis Date Noted  . Carpal tunnel syndrome of right wrist 12/06/2017  . Radial styloid tenosynovitis 08/11/2017  . Lumbar facet osteoarthritis 11/10/2016  . Chronic musculoskeletal pain 11/05/2016  . Lumbar radiculitis (Right) (L4) 09/28/2016  .  Vitamin D insufficiency 07/30/2016  . Chronic pain syndrome 07/08/2016  . Long term (current) use of opiate analgesic 07/08/2016  . Long term prescription opiate use 07/08/2016  . Opiate use (30 MME/day) 07/08/2016  . Chronic low back pain (Primary Source of Pain) (midline) ( (Bilateral) (L>R) 07/08/2016  . Disturbance of skin sensation 07/08/2016  . Chronic neck pain (Secondary source of pain) (midline) (Bilateral) (L>R) 07/08/2016  . DDD (degenerative disc disease), cervical 07/08/2016  . DDD (degenerative disc disease), lumbar 07/08/2016  . Cervical spondylosis 07/08/2016  . Cervical radicular pain (Right) (C7/C8) 07/08/2016  . Upper extremity numbness (Right) 07/08/2016  . Cervical facet syndrome (Bilateral) (L>R) 07/08/2016  . Osteoarthritis of knee (Bilateral) (R>L) 07/08/2016  . Chronic lower extremity pain (Right) 07/08/2016  . Lumbar facet syndrome (Bilateral) (L>R) 07/08/2016  . Grade 1 Anterolisthesis of L4 over L5 (3 mm) 07/08/2016  . Grade 1 Retrolisthesis of L5 over S1 (5 mm) 07/08/2016  . Chronic knee pain [facility] source of pain) (Bilateral) (R>L) 05/12/2016    Past Surgical History:  Procedure Laterality Date  . CARPAL TUNNEL RELEASE Right 12/06/2017   Procedure: CARPAL TUNNEL RELEASE;  Surgeon: Earnestine Leys, MD;  Location: [facility] ORS;  Service: Orthopedics;  Laterality: Right;  . DORSAL  COMPARTMENT RELEASE Right 12/06/2017   Procedure: RELEASE DORSAL COMPARTMENT (DEQUERVAIN);  Surgeon: Earnestine Leys, MD;  Location: [facility] ORS;  Service: Orthopedics;  Laterality: Right;  . PARTIAL HYSTERECTOMY    . TUBAL LIGATION      Prior to Admission medications   Medication Sig Start Date End Date Taking? Authorizing Provider  cyclobenzaprine (FLEXERIL) 10 MG tablet Take 1 tablet (10 mg total) by mouth 3 (three) times daily as needed. 07/19/19   Fisher, Linden Dolin, PA-C  fluticasone (FLONASE) 50 MCG/ACT nasal spray Place 2 sprays into both nostrils daily.     [provider]   glimepiride (AMARYL) 2 MG tablet Take 2 mg by mouth daily. 07/03/17   [provider]  ibuprofen (ADVIL) 800 MG tablet Take 800 mg by mouth 2 (two) times daily as needed. 03/15/19   [provider]  levofloxacin (LEVAQUIN) 750 MG tablet Take 1 tablet (750 mg total) by mouth daily for 5 days. 09/02/19 09/07/19  Lannie Fields, PA-C  lisinopril (ZESTRIL) 10 MG tablet  10/10/18   [provider]  methylPREDNISolone (MEDROL DOSEPAK) 4 MG TBPK tablet Take 6 pills on day one then decrease by 1 pill each day 07/19/19   Versie Starks, PA-C  ondansetron (ZOFRAN-ODT) 4 MG disintegrating tablet Take 1 tablet (4 mg total) by mouth every 8 (eight) hours as needed. 05/18/19   Karen Kitchens, NP  oxyCODONE-acetaminophen (PERCOCET) 10-325 MG tablet Take 1 tablet by mouth 3 (three) times daily as needed. 05/12/19   [provider]  pantoprazole (PROTONIX) 40 MG tablet Take 40 mg by mouth daily. 02/21/19   [provider]  triamcinolone cream (KENALOG) 0.1 % Apply 1 application topically 2 (two) times daily as needed. 08/09/18   Karen Kitchens, NP  albuterol (PROVENTIL HFA;VENTOLIN HFA) 108 (90 BASE) MCG/ACT inhaler Inhale 2 puffs into the lungs every 6 (six) hours as needed for wheezing or shortness of breath.  11/06/18  [provider]  atorvastatin (LIPITOR) 10 MG tablet Take 10 mg by mouth at bedtime.   11/06/18  [provider]  DULoxetine (CYMBALTA) 20 MG capsule  08/10/18 05/18/19  [provider]  gabapentin (NEURONTIN) 400 MG capsule Take 1 capsule (400 mg total) by mouth 2 (two) times daily. 12/06/17 07/27/18  Earnestine Leys, MD  montelukast (SINGULAIR) 10 MG tablet Take 10 mg by mouth as needed.   07/27/18  [provider]    Allergies Bc powder [aspirin-salicylamide-caffeine], Gabapentin, Hydrocodone-acetaminophen, Latex, Penicillin g, Penicillins, and Shrimp [shellfish allergy]  Family History  Problem Relation Age of Onset  . Diabetes  Mother   . Hypertension Mother   . Heart failure Mother   . Cancer Mother   . Diabetes Father   . Hypertension Father     Social History Social History   Tobacco Use  . Smoking status: Never Smoker  . Smokeless tobacco: Never Used  Vaping Use  . Vaping Use: Never used  Substance Use Topics  . Alcohol use: No  . Drug use: No     Review of Systems  Constitutional: No fever/chills Eyes:  No discharge ENT: No upper respiratory complaints. Respiratory: no cough. No SOB/ use of accessory muscles to breath Gastrointestinal:   No nausea, no vomiting.  No diarrhea.  No constipation. Musculoskeletal: Negative for musculoskeletal pain. Skin: Patient has left foot foreign body.     ____________________________________________   PHYSICAL EXAM:  VITAL SIGNS: ED Triage Vitals  Enc Vitals Group     BP 09/02/19  1608 113/75     Pulse Rate 09/02/19 1608 73     Resp 09/02/19 1608 20     Temp 09/02/19 1608 98.5 F (36.9 C)     Temp Source 09/02/19 1608 Oral     SpO2 09/02/19 1608 100 %     Weight 09/02/19 1606 (!) 229 lb 15 oz (104.3 kg)     Height 09/02/19 1606 5\' 7"  (1.702 m)     Head Circumference --      Peak Flow --      Pain Score 09/02/19 1606 10     Pain Loc --      Pain Edu? --      Excl. in [place]? --      Constitutional: Alert and oriented. Well appearing and in no acute distress. Eyes: Conjunctivae are normal. PERRL. EOMI. Head: Atraumatic. Cardiovascular: Normal rate, regular rhythm. Normal S1 and S2.  Good peripheral circulation. Respiratory: Normal respiratory effort without tachypnea or retractions. Lungs CTAB. Good air entry to the bases with no decreased or absent breath sounds Gastrointestinal: Bowel sounds x 4 quadrants. Soft and nontender to palpation. No guarding or rigidity. No distention. Musculoskeletal: Full range of motion to all extremities. No obvious deformities noted Neurologic:  Normal for age. No gross focal neurologic deficits are  appreciated. an Skin: Patient has a puncture wound along the plantar aspect of the left foot. Psychiatric: Mood and affect are normal for age. Speech and behavior are normal.   ____________________________________________   LABS (all labs ordered are listed, but only abnormal results are displayed)  Labs Reviewed - No data to display ____________________________________________  EKG   ____________________________________________  RADIOLOGY Unk Pinto, personally viewed and evaluated these images (plain radiographs) as part of my medical decision making, as well as reviewing the written report by the radiologist.  DG Foot Complete Left  Result Date: 09/02/2019 CLINICAL DATA:  Evaluate foreign body. EXAM: LEFT FOOT - COMPLETE 3+ VIEW COMPARISON:  None. FINDINGS: Metallic nail-like foreign body within the soft tissues underlying the midfoot. No underlying osseous abnormality. No additional foreign body. IMPRESSION: Metallic nail-like foreign body within the soft tissues underlying the midfoot. No osseous abnormality. Electronically Signed   By: Franki Cabot M.D.   On: 09/02/2019 18:57    ____________________________________________    PROCEDURES  Procedure(s) performed:     Procedures     Medications  lidocaine (XYLOCAINE) 1 % (with pres) injection 5 mL (5 mLs Infiltration Not Given 09/02/19 1940)  Tdap (BOOSTRIX) injection 0.5 mL (0.5 mLs Intramuscular Given 09/02/19 1911)  HYDROcodone-acetaminophen (NORCO/VICODIN) 5-325 MG per tablet 1 tablet (1 tablet Oral Given 09/02/19 1951)  ondansetron (ZOFRAN-ODT) disintegrating tablet 4 mg (4 mg Oral Given 09/02/19 1950)     ____________________________________________   INITIAL IMPRESSION / ASSESSMENT AND PLAN / ED COURSE  Pertinent labs & imaging results that were available during my care of the patient were reviewed by me and considered in my medical decision making (see chart for details).      Assessment and  plan Puncture wound 59 year old female presents to the emergency department with a soft tissue foreign body.  Foreign body was removed in the emergency department by patient.  She was discharged with Levaquin once a day for the next 7 days.  Tetanus status was updated in the emergency department.  1 tablet of Norco was given in the emergency department for pain.  Patient was not discharged with any other pain medication as she receives chronic pain medication from  her primary care provider.    ____________________________________________  FINAL CLINICAL IMPRESSION(S) / ED DIAGNOSES  Final diagnoses:  Soft tissues foreign body  Puncture wound      NEW MEDICATIONS STARTED DURING THIS VISIT:  ED Discharge Orders         Ordered    levofloxacin (LEVAQUIN) 750 MG tablet  Daily     Discontinue  Reprint     09/02/19 1943              This chart was dictated using voice recognition software/Dragon. Despite best efforts to proofread, errors can occur which can change the meaning. Any change was purely unintentional.     Lannie Fields, PA-C 09/02/19 Bobetta Lime, MD 09/04/19 [phone]

## 2020-01-12 ENCOUNTER — Ambulatory Visit: Payer: Medicare Other | Admitting: Endocrinology

## 2020-01-18 ENCOUNTER — Ambulatory Visit
Admission: EM | Admit: 2020-01-18 | Discharge: 2020-01-18 | Disposition: A | Discharge status: Home / Self Care | Payer: Medicare Other | Attending: Physician Assistant | Admitting: Physician Assistant

## 2020-01-18 ENCOUNTER — Other Ambulatory Visit: Payer: Self-pay

## 2020-01-18 ENCOUNTER — Encounter: Payer: Self-pay | Admitting: Emergency Medicine

## 2020-01-18 DIAGNOSIS — R0981 Nasal congestion: Secondary | ICD-10-CM | POA: Diagnosis present

## 2020-01-18 DIAGNOSIS — B9689 Other specified bacterial agents as the cause of diseases classified elsewhere: Secondary | ICD-10-CM | POA: Diagnosis present

## 2020-01-18 DIAGNOSIS — N760 Acute vaginitis: Secondary | ICD-10-CM | POA: Diagnosis present

## 2020-01-18 LAB — URINALYSIS, COMPLETE (UACMP) WITH MICROSCOPIC
Bilirubin Urine: NEGATIVE
Glucose, UA: NEGATIVE mg/dL
Ketones, ur: NEGATIVE mg/dL
Nitrite: NEGATIVE
Protein, ur: NEGATIVE mg/dL
Specific Gravity, Urine: 1.02 (ref 1.005–1.030)
pH: 6.5 (ref 5.0–8.0)

## 2020-01-18 LAB — WET PREP, GENITAL
Sperm: NONE SEEN
Trich, Wet Prep: NONE SEEN
Yeast Wet Prep HPF POC: NONE SEEN

## 2020-01-18 MED ORDER — METRONIDAZOLE 0.75 % VA GEL: VAGINAL | 0 refills | Status: DC

## 2020-01-18 MED ORDER — FLUCONAZOLE 150 MG PO TABS: 150.0000 mg | ORAL_TABLET | Freq: Once | ORAL | 0 refills | Status: AC

## 2020-01-18 MED ORDER — CETIRIZINE-PSEUDOEPHEDRINE ER 5-120 MG PO TB12
1.0000 | ORAL_TABLET | Freq: Every day | ORAL | 0 refills | Status: DC
Start: 2020-01-18 — End: 2021-09-11

## 2020-01-18 NOTE — ED Triage Notes (Signed)
Pt c/o dysuria, vaginal irritation and itching started about 2 weeks ago. She states she was treat for a uti 2 weeks ago and was not treated for yeast. She also c/o nasal congestion. She was tested at her job for covid about 4 days ago and was negative. She states her nasal congestion started about 2 week ago.

## 2020-01-18 NOTE — Discharge Instructions (Signed)
I will have you try Zertec D for the nose congestion Your urine has few bacteria, so I wont treat this unless the culture comes back positive. May be from the vaginal infection

## 2020-01-18 NOTE — ED Provider Notes (Signed)
[facility]    CSN: [id number] Arrival date & time: 01/18/20  1030      History   Chief Complaint No chief complaint on file.   HPI Vanessa Fisher is a 59 y.o. female who presents with a couple of complaints 1- Vaginal itching since treatment for UTI.   2- Nose congestion x 2 weeks, Rx nasal spray is not helping. Denies fever.     Past Medical History:  Diagnosis Date  . Anemia   . Anxiety   . Arthritis   . Asthma    WELL CONTROLLED  . Bile acid malabsorption syndrome   . Bradycardia    HAD AN ISSUE WITH THIS IN 2016-NO PROBLEMS SINCE  . Depression   . Diabetes mellitus without complication ([place])   . Diverticulitis   . Gastric reflux   . GERD (gastroesophageal reflux disease)   . Hand pain 05/12/2016  . Headache    H/O MIGRAINES  . History of kidney stones    H/O  . Neck pain, chronic   . Panic attacks     Patient Active Problem List   Diagnosis Date Noted  . Carpal tunnel syndrome of right wrist 12/06/2017  . Radial styloid tenosynovitis 08/11/2017  . Lumbar facet osteoarthritis 11/10/2016  . Chronic musculoskeletal pain 11/05/2016  . Lumbar radiculitis (Right) (L4) 09/28/2016  . Vitamin D insufficiency 07/30/2016  . Chronic pain syndrome 07/08/2016  . Long term (current) use of opiate analgesic 07/08/2016  . Long term prescription opiate use 07/08/2016  . Opiate use (30 MME/day) 07/08/2016  . Chronic low back pain (Primary Source of Pain) (midline) ( (Bilateral) (L>R) 07/08/2016  . Disturbance of skin sensation 07/08/2016  . Chronic neck pain (Secondary source of pain) (midline) (Bilateral) (L>R) 07/08/2016  . DDD (degenerative disc disease), cervical 07/08/2016  . DDD (degenerative disc disease), lumbar 07/08/2016  . Cervical spondylosis 07/08/2016  . Cervical radicular pain (Right) (C7/C8) 07/08/2016  . Upper extremity numbness (Right) 07/08/2016  . Cervical facet syndrome (Bilateral) (L>R) 07/08/2016  . Osteoarthritis of knee  (Bilateral) (R>L) 07/08/2016  . Chronic lower extremity pain (Right) 07/08/2016  . Lumbar facet syndrome (Bilateral) (L>R) 07/08/2016  . Grade 1 Anterolisthesis of L4 over L5 (3 mm) 07/08/2016  . Grade 1 Retrolisthesis of L5 over S1 (5 mm) 07/08/2016  . Chronic knee pain [facility] source of pain) (Bilateral) (R>L) 05/12/2016    Past Surgical History:  Procedure Laterality Date  . CARPAL TUNNEL RELEASE Right 12/06/2017   Procedure: CARPAL TUNNEL RELEASE;  Surgeon: Earnestine Leys, MD;  Location: [facility] ORS;  Service: Orthopedics;  Laterality: Right;  . DORSAL COMPARTMENT RELEASE Right 12/06/2017   Procedure: RELEASE DORSAL COMPARTMENT (DEQUERVAIN);  Surgeon: Earnestine Leys, MD;  Location: [facility] OR[facility];  Service: Orthopedics;  Laterality: Right;  . PARTIAL HYSTERECTOMY    . TUBAL LIGATION      OB History   No obstetric history on file.      Home Medications    Prior to Admission medications   Medication Sig Start Date End Date Taking? Authorizing Provider  cyclobenzaprine (FLEXERIL) 10 MG tablet Take 1 tablet (10 mg total) by mouth 3 (three) times daily as needed. 07/19/19   Fisher, Linden Dolin, PA-C  fluticasone (FLONASE) 50 MCG/ACT nasal spray Place 2 sprays into both nostrils daily.     [provider]  glimepiride (AMARYL) 2 MG tablet Take 2 mg by mouth daily. 07/03/17   [provider]  ibuprofen (ADVIL) 800 MG tablet Take 800 mg by  mouth 2 (two) times daily as needed. 03/15/19   [provider]  lisinopril (ZESTRIL) 10 MG tablet  10/10/18   [provider]  methylPREDNISolone (MEDROL DOSEPAK) 4 MG TBPK tablet Take 6 pills on day one then decrease by 1 pill each day 07/19/19   Versie Starks, PA-C  ondansetron (ZOFRAN-ODT) 4 MG disintegrating tablet Take 1 tablet (4 mg total) by mouth every 8 (eight) hours as needed. 05/18/19   Karen Kitchens, NP  oxyCODONE-acetaminophen (PERCOCET) 10-325 MG tablet Take 1 tablet by mouth 3 (three) times daily as needed. 05/12/19    [provider]  pantoprazole (PROTONIX) 40 MG tablet Take 40 mg by mouth daily. 02/21/19   [provider]  triamcinolone cream (KENALOG) 0.1 % Apply 1 application topically 2 (two) times daily as needed. 08/09/18   Karen Kitchens, NP  albuterol (PROVENTIL HFA;VENTOLIN HFA) 108 (90 BASE) MCG/ACT inhaler Inhale 2 puffs into the lungs every 6 (six) hours as needed for wheezing or shortness of breath.  11/06/18  [provider]  atorvastatin (LIPITOR) 10 MG tablet Take 10 mg by mouth at bedtime.   11/06/18  [provider]  DULoxetine (CYMBALTA) 20 MG capsule  08/10/18 05/18/19  [provider]  gabapentin (NEURONTIN) 400 MG capsule Take 1 capsule (400 mg total) by mouth 2 (two) times daily. 12/06/17 07/27/18  Earnestine Leys, MD  montelukast (SINGULAIR) 10 MG tablet Take 10 mg by mouth as needed.   07/27/18  [provider]    Family History Family History  Problem Relation Age of Onset  . Diabetes Mother   . Hypertension Mother   . Heart failure Mother   . Cancer Mother   . Diabetes Father   . Hypertension Father     Social History Social History   Tobacco Use  . Smoking status: Never Smoker  . Smokeless tobacco: Never Used  Vaping Use  . Vaping Use: Never used  Substance Use Topics  . Alcohol use: No  . Drug use: No     Allergies   Bc powder [aspirin-salicylamide-caffeine], Gabapentin, Hydrocodone-acetaminophen, Latex, Penicillin g, Penicillins, and Shrimp [shellfish allergy]   Review of Systems Review of Systems  Constitutional: Positive for fatigue. Negative for activity change, appetite change, chills, diaphoresis and fever.  HENT: Positive for congestion and postnasal drip.   Eyes: Negative for discharge.  Respiratory: Positive for cough. Negative for chest tightness, shortness of breath and wheezing.   Cardiovascular: Negative for chest pain.  Gastrointestinal: Positive for abdominal pain.  Genitourinary: Positive for  pelvic pain. Negative for dysuria, genital sores, vaginal discharge and vaginal pain.  Musculoskeletal: Negative for gait problem and myalgias.  Skin: Negative for rash.  Neurological: Negative for dizziness and headaches.  Hematological: Positive for adenopathy.     Physical Exam Triage Vital Signs ED Triage Vitals  Enc Vitals Group     BP      Pulse      Resp      Temp      Temp src      SpO2      Weight      Height      Head Circumference      Peak Flow      Pain Score      Pain Loc      Pain Edu?      Excl. in [place]?    No data found.  Updated Vital Signs There were no vitals taken for this visit.  Visual Acuity Right Eye Distance:   Left Eye Distance:   Bilateral Distance:    Right Eye Near:   Left Eye Near:    Bilateral Near:     Physical Exam Physical Exam Vitals signs and nursing note reviewed.  Constitutional:      General: She is not in acute distress.    Appearance: Normal appearance. She is not ill-appearing, toxic-appearing or diaphoretic.  HENT:     Head: Normocephalic.     Right Ear: Tympanic membrane, ear canal and external ear normal.     Left Ear: Tympanic membrane, ear canal and external ear normal.     Nose: Nose has mild mucosa swelling    Mouth/Throat: clear    Mouth: Mucous membranes are moist.  Eyes:     General: No scleral icterus.       Right eye: No discharge.        Left eye: No discharge.     Conjunctiva/sclera: Conjunctivae normal.  Neck:     Musculoskeletal: Neck supple. No neck rigidity. Has a shotty node on L mid cervical chain which is tender, is mobile and soft.  Cardiovascular:     Rate and Rhythm: Normal rate and regular rhythm.     Heart sounds: No murmur.  Pulmonary:     Effort: Pulmonary effort is normal.     Breath sounds: Normal breath sounds.   Musculoskeletal: Normal range of motion.  Lymphadenopathy:     Cervical: No cervical adenopathy.  Skin:    General: Skin is warm and dry.     Coloration: Skin is  not jaundiced.     Findings: No rash.  Neurological:     Mental Status: She is alert and oriented to person, place, and time.     Gait: Gait normal.  Psychiatric:        Mood and Affect: Mood normal.        Behavior: Behavior normal.        Thought Content: Thought content normal.        Judgment: Judgment normal.     UC Treatments / Results  Labs (all labs ordered are listed, but only abnormal results are displayed) Labs Reviewed  URINALYSIS, COMPLETE (UACMP) WITH MICROSCOPIC    EKG   Radiology No results found.  Procedures Procedures (including critical care time)  Medications Ordered in UC Medications - No data to display  Initial Impression / Assessment and Plan / UC Course  I have reviewed the triage vital signs and the nursing notes. Has BV and nose congestion without signs of infection.  I placed her on Zyrtec D and metrogel as noted. I also sent Diflucan in case she gets an yest infection from the Metrogel. Urine sent out for a culture Pertinent labs results that were available during my care of the patient were reviewed by me and considered in my medical decision making (see chart for details).   Final Clinical Impressions(s) / UC Diagnoses   Final diagnoses:  None   Discharge Instructions   None    ED Prescriptions    None     PDMP not reviewed this encounter.   Shelby Mattocks, PA-C 01/18/20 1325

## 2020-01-20 LAB — URINE CULTURE: Special Requests: NORMAL

## 2020-02-06 ENCOUNTER — Other Ambulatory Visit: Payer: Self-pay

## 2020-02-06 ENCOUNTER — Ambulatory Visit
Admission: EM | Admit: 2020-02-06 | Discharge: 2020-02-06 | Disposition: A | Discharge status: Home / Self Care | Payer: Medicare Other | Attending: Family Medicine | Admitting: Family Medicine

## 2020-02-06 ENCOUNTER — Ambulatory Visit (INDEPENDENT_AMBULATORY_CARE_PROVIDER_SITE_OTHER): Payer: Medicare Other

## 2020-02-06 DIAGNOSIS — B379 Candidiasis, unspecified: Secondary | ICD-10-CM | POA: Diagnosis present

## 2020-02-06 DIAGNOSIS — R059 Cough, unspecified: Secondary | ICD-10-CM

## 2020-02-06 DIAGNOSIS — J45901 Unspecified asthma with (acute) exacerbation: Secondary | ICD-10-CM | POA: Diagnosis present

## 2020-02-06 DIAGNOSIS — U071 COVID-19: Secondary | ICD-10-CM | POA: Insufficient documentation

## 2020-02-06 DIAGNOSIS — J209 Acute bronchitis, unspecified: Secondary | ICD-10-CM | POA: Diagnosis present

## 2020-02-06 MED ORDER — PREDNISONE 20 MG PO TABS: 40.0000 mg | ORAL_TABLET | Freq: Every day | ORAL | 0 refills | Status: AC

## 2020-02-06 MED ORDER — BENZONATATE 100 MG PO CAPS
200.0000 mg | ORAL_CAPSULE | Freq: Three times a day (TID) | ORAL | 0 refills | Status: AC | PRN
Start: 2020-02-06 — End: 2020-02-16

## 2020-02-06 MED ORDER — FLUCONAZOLE 150 MG PO TABS
ORAL_TABLET | ORAL | 0 refills | Status: DC
Start: 2020-02-06 — End: 2020-10-06

## 2020-02-06 NOTE — Discharge Instructions (Signed)
Your chest x-ray is normal.  I suspect you are having a flareup of your asthma likely due to viral bronchitis.  Start the prednisone.  And continue use your inhalers.  I also sent something for cough.  Additionally, since you state you have a yeast infection from recent antibiotic use, I have sent Diflucan.  Rest and increase your fluid intake.  Go to ED for any worsening symptoms.  Talk to PCP about nebulizer machine.  You have received COVID testing today either for positive exposure, concerning symptoms that could be related to COVID infection, screening purposes, or re-testing after confirmed positive.  Your test obtained today checks for active viral infection in the last 1-2 weeks. If your test is negative now, you can still test positive later. So, if you do develop symptoms you should either get re-tested and/or isolate x 5 days and then strict mask use x 5 days (unvaccinated) or mask use x 10 days (vaccinated). Please follow CDC guidelines.  While Rapid antigen tests come back in 15-20 minutes, send out PCR/molecular test results typically come back within 1-3 days. In the mean time, if you are symptomatic, assume this could be a positive test and treat/monitor yourself as if you do have COVID.   We will call with test results if positive. Please download the MyChart app and set up a profile to access test results.   If symptomatic, go home and rest. Push fluids. Take Tylenol as needed for discomfort. Gargle warm salt water. Throat lozenges. Take Mucinex DM or Robitussin for cough. Humidifier in bedroom to ease coughing. Warm showers. Also review the COVID handout for more information.  COVID-19 INFECTION: The incubation period of COVID-19 is approximately 14 days after exposure, with most symptoms developing in roughly 4-5 days. Symptoms may range in severity from mild to critically severe. Roughly 80% of those infected will have mild symptoms. People of any age may become infected with COVID-19  and have the ability to transmit the virus. The most common symptoms include: fever, fatigue, cough, body aches, headaches, sore throat, nasal congestion, shortness of breath, nausea, vomiting, diarrhea, changes in smell and/or taste.    COURSE OF ILLNESS Some patients may begin with mild disease which can progress quickly into critical symptoms. If your symptoms are worsening please call ahead to the Emergency Department and proceed there for further treatment. Recovery time appears to be roughly 1-2 weeks for mild symptoms and 3-6 weeks for severe disease.   GO IMMEDIATELY TO ER FOR FEVER YOU ARE UNABLE TO GET DOWN WITH TYLENOL, BREATHING PROBLEMS, CHEST PAIN, FATIGUE, LETHARGY, INABILITY TO EAT OR DRINK, ETC  QUARANTINE AND ISOLATION: To help decrease the spread of COVID-19 please remain isolated if you have COVID infection or are highly suspected to have COVID infection. This means -stay home and isolate to one room in the home if you live with others. Do not share a bed or bathroom with others while ill, sanitize and wipe down all countertops and keep common areas clean and disinfected. Stay home for 5 days. If you have no symptoms or your symptoms are resolving after 5 days, you can leave your house. Continue to wear a mask around others for 5 additional days. If you have been in close contact (within 6 feet) of someone diagnosed with COVID 19, you are advised to quarantine in your home for 14 days as symptoms can develop anywhere from 2-14 days after exposure to the virus. If you develop symptoms, you  must isolate.  Most current guidelines for COVID after exposure -unvaccinated: isolate 5 days and strict mask use x 5 days. Test on day 5 is possible -vaccinated: wear mask x 10 days if symptoms do not develop -You do not necessarily need to be tested for COVID if you have + exposure and  develop symptoms. Just isolate at home x10 days from symptom onset During this global pandemic, CDC advises to  practice social distancing, try to stay at least 57ft away from others at all times. Wear a face covering. Wash and sanitize your hands regularly and avoid going anywhere that is not necessary.  KEEP IN MIND THAT THE COVID TEST IS NOT 100% ACCURATE AND YOU SHOULD STILL DO EVERYTHING TO PREVENT POTENTIAL SPREAD OF VIRUS TO OTHERS (WEAR MASK, WEAR GLOVES, WASH HANDS AND SANITIZE REGULARLY). IF INITIAL TEST IS NEGATIVE, THIS MAY NOT MEAN YOU ARE DEFINITELY NEGATIVE. MOST ACCURATE TESTING IS DONE 5-7 DAYS AFTER EXPOSURE.   It is not advised by CDC to get re-tested after receiving a positive COVID test since you can still test positive for weeks to months after you have already cleared the virus.   *If you have not been vaccinated for COVID, I strongly suggest you consider getting vaccinated as long as there are no contraindications.

## 2020-02-06 NOTE — ED Provider Notes (Signed)
[facility]    CSN: [id number] Arrival date & time: 02/06/20  0947      History   Chief Complaint Chief Complaint  Patient presents with  . Cough    [phone]    HPI Vanessa Fisher is a 60 y.o. female presenting for 2 to 3-week history of productive cough, chills, fatigue, body aches, wheezing and breathing difficulty.  She denies any associated fever, weakness, chest pain, nasal congestion, sore throat, abdominal pain, N/V/D, or changes in smell and taste.  Denies any known COVID-19 exposure and has been fully vaccinated for COVID-19.  Patient states that she was seen at a different urgent care about a week ago and prescribed an antibiotic.  She states that she finished the full course of the antibiotic and symptoms not improved.  Patient does report history of asthma and states she has been using her daily inhaler about 3 times a day and does not feel like her breathing or wheezing has improved.  Has not been taking any cough medication.  Other past medical history significant for diabetes.  No other complaints or concerns.  HPI  Past Medical History:  Diagnosis Date  . Anemia   . Anxiety   . Arthritis   . Asthma    WELL CONTROLLED  . Bile acid malabsorption syndrome   . Bradycardia    HAD AN ISSUE WITH THIS IN 2016-NO PROBLEMS SINCE  . Depression   . Diabetes mellitus without complication ([place])   . Diverticulitis   . Gastric reflux   . GERD (gastroesophageal reflux disease)   . Hand pain 05/12/2016  . Headache    H/O MIGRAINES  . History of kidney stones    H/O  . Neck pain, chronic   . Panic attacks     Patient Active Problem List   Diagnosis Date Noted  . Carpal tunnel syndrome of right wrist 12/06/2017  . Radial styloid tenosynovitis 08/11/2017  . Lumbar facet osteoarthritis 11/10/2016  . Chronic musculoskeletal pain 11/05/2016  . Lumbar radiculitis (Right) (L4) 09/28/2016  . Vitamin D insufficiency 07/30/2016  . Chronic pain syndrome  07/08/2016  . Long term (current) use of opiate analgesic 07/08/2016  . Long term prescription opiate use 07/08/2016  . Opiate use (30 MME/day) 07/08/2016  . Chronic low back pain (Primary Source of Pain) (midline) ( (Bilateral) (L>R) 07/08/2016  . Disturbance of skin sensation 07/08/2016  . Chronic neck pain (Secondary source of pain) (midline) (Bilateral) (L>R) 07/08/2016  . DDD (degenerative disc disease), cervical 07/08/2016  . DDD (degenerative disc disease), lumbar 07/08/2016  . Cervical spondylosis 07/08/2016  . Cervical radicular pain (Right) (C7/C8) 07/08/2016  . Upper extremity numbness (Right) 07/08/2016  . Cervical facet syndrome (Bilateral) (L>R) 07/08/2016  . Osteoarthritis of knee (Bilateral) (R>L) 07/08/2016  . Chronic lower extremity pain (Right) 07/08/2016  . Lumbar facet syndrome (Bilateral) (L>R) 07/08/2016  . Grade 1 Anterolisthesis of L4 over L5 (3 mm) 07/08/2016  . Grade 1 Retrolisthesis of L5 over S1 (5 mm) 07/08/2016  . Chronic knee pain [facility] source of pain) (Bilateral) (R>L) 05/12/2016    Past Surgical History:  Procedure Laterality Date  . CARPAL TUNNEL RELEASE Right 12/06/2017   Procedure: CARPAL TUNNEL RELEASE;  Surgeon: Earnestine Leys, MD;  Location: [facility] ORS;  Service: Orthopedics;  Laterality: Right;  . DORSAL COMPARTMENT RELEASE Right 12/06/2017   Procedure: RELEASE DORSAL COMPARTMENT (DEQUERVAIN);  Surgeon: Earnestine Leys, MD;  Location: [facility] ORS;  Service: Orthopedics;  Laterality: Right;  . PARTIAL HYSTERECTOMY    .  TUBAL LIGATION      OB History   No obstetric history on file.      Home Medications    Prior to Admission medications   Medication Sig Start Date End Date Taking? Authorizing Provider  benzonatate (TESSALON) 100 MG capsule Take 2 capsules (200 mg total) by mouth 3 (three) times daily as needed for up to 10 days for cough. 02/06/20 02/16/20 Yes Shirlee Latch, PA-C  fluconazole (DIFLUCAN) 150 MG tablet Take 1 tablet by mouth  every 72 hours as needed for yeast infection 02/06/20  Yes Eusebio Friendly B, PA-C  fluticasone (FLONASE) 50 MCG/ACT nasal spray Place 2 sprays into both nostrils daily.    Yes [provider]  glimepiride (AMARYL) 2 MG tablet Take 2 mg by mouth daily. 07/03/17  Yes [provider]  ibuprofen (ADVIL) 800 MG tablet Take 800 mg by mouth 2 (two) times daily as needed. 03/15/19  Yes [provider]  lisinopril (ZESTRIL) 10 MG tablet  10/10/18  Yes [provider]  oxyCODONE-acetaminophen (PERCOCET) 10-325 MG tablet Take 1 tablet by mouth 3 (three) times daily as needed. 05/12/19  Yes [provider]  pantoprazole (PROTONIX) 40 MG tablet Take 40 mg by mouth daily. 02/21/19  Yes [provider]  predniSONE (DELTASONE) 20 MG tablet Take 2 tablets (40 mg total) by mouth daily for 5 days. 02/06/20 02/11/20 Yes Eusebio Friendly B, PA-C  montelukast (SINGULAIR) 10 MG tablet Take 10 mg by mouth as needed.   07/27/18 Yes [provider]  cetirizine-pseudoephedrine (ZYRTEC-D) 5-120 MG tablet Take 1 tablet by mouth daily. 01/18/20   Rodriguez-Southworth, Nettie Elm, PA-C  metroNIDAZOLE (METROGEL VAGINAL) 0.75 % vaginal gel One applicator qhs x 5 nights 01/18/20   Rodriguez-Southworth, Nettie Elm, PA-C  ondansetron (ZOFRAN-ODT) 4 MG disintegrating tablet Take 1 tablet (4 mg total) by mouth every 8 (eight) hours as needed. 05/18/19   Verlee Monte, NP  triamcinolone cream (KENALOG) 0.1 % Apply 1 application topically 2 (two) times daily as needed. 08/09/18   Verlee Monte, NP  albuterol (PROVENTIL HFA;VENTOLIN HFA) 108 (90 BASE) MCG/ACT inhaler Inhale 2 puffs into the lungs every 6 (six) hours as needed for wheezing or shortness of breath.  11/06/18  [provider]  atorvastatin (LIPITOR) 10 MG tablet Take 10 mg by mouth at bedtime.   11/06/18  [provider]  DULoxetine (CYMBALTA) 20 MG capsule  08/10/18 05/18/19  [provider]  gabapentin (NEURONTIN) 400 MG  capsule Take 1 capsule (400 mg total) by mouth 2 (two) times daily. 12/06/17 07/27/18  Deeann Saint, MD    Family History Family History  Problem Relation Age of Onset  . Diabetes Mother   . Hypertension Mother   . Heart failure Mother   . Cancer Mother   . Diabetes Father   . Hypertension Father     Social History Social History   Tobacco Use  . Smoking status: Never Smoker  . Smokeless tobacco: Never Used  Vaping Use  . Vaping Use: Never used  Substance Use Topics  . Alcohol use: No  . Drug use: No     Allergies   Bc powder [aspirin-salicylamide-caffeine], Gabapentin, Hydrocodone-acetaminophen, Latex, Penicillin g, Penicillins, and Shrimp [shellfish allergy]   Review of Systems Review of Systems  Constitutional: Positive for chills and fatigue. Negative for diaphoresis and fever.  HENT: Positive for congestion. Negative for ear pain, rhinorrhea, sinus pressure, sinus pain and sore throat.   Respiratory: Positive for cough, shortness of breath  and wheezing.   Cardiovascular: Negative for chest pain.  Gastrointestinal: Negative for abdominal pain, nausea and vomiting.  Musculoskeletal: Positive for myalgias. Negative for arthralgias.  Skin: Negative for rash.  Neurological: Negative for weakness and headaches.  Hematological: Negative for adenopathy.     Physical Exam Triage Vital Signs ED Triage Vitals  Enc Vitals Group     BP 02/06/20 1120 (!) 174/60     Pulse Rate 02/06/20 1120 79     Resp 02/06/20 1120 16     Temp 02/06/20 1120 98.3 F (36.8 C)     Temp Source 02/06/20 1120 Oral     SpO2 02/06/20 1120 100 %     Weight 02/06/20 1031 229 lb 15 oz (104.3 kg)     Height 02/06/20 1031 5\' 7"  (1.702 m)     Head Circumference --      Peak Flow --      Pain Score 02/06/20 1031 10     Pain Loc --      Pain Edu? --      Excl. in [place]? --    No data found.  Updated Vital Signs BP (!) 142/69   Pulse 81   Temp 98.3 F (36.8 C) (Oral)   Resp 16   Ht 5\' 7"   (1.702 m)   Wt 229 lb 15 oz (104.3 kg)   SpO2 100%   BMI 36.01 kg/m     Physical Exam Vitals and nursing note reviewed.  Constitutional:      General: She is not in acute distress.    Appearance: Normal appearance. She is ill-appearing. She is not toxic-appearing or diaphoretic.  HENT:     Head: Normocephalic and atraumatic.     Nose: Nose normal.     Mouth/Throat:     Mouth: Mucous membranes are moist.     Pharynx: Oropharynx is clear.  Eyes:     General: No scleral icterus.       Right eye: No discharge.        Left eye: No discharge.     Conjunctiva/sclera: Conjunctivae normal.  Cardiovascular:     Rate and Rhythm: Normal rate and regular rhythm.     Heart sounds: Normal heart sounds.  Pulmonary:     Effort: Pulmonary effort is normal. No respiratory distress.     Breath sounds: Normal breath sounds. No wheezing, rhonchi or rales.  Musculoskeletal:     Cervical back: Neck supple.  Skin:    General: Skin is dry.  Neurological:     General: No focal deficit present.     Mental Status: She is alert. Mental status is at baseline.     Motor: No weakness.     Gait: Gait normal.  Psychiatric:        Mood and Affect: Mood normal.        Behavior: Behavior normal.        Thought Content: Thought content normal.      UC Treatments / Results  Labs (all labs ordered are listed, but only abnormal results are displayed) Labs Reviewed  SARS CORONAVIRUS 2 (TAT 6-24 HRS)    EKG   Radiology DG Chest 2 View  Result Date: 02/06/2020 CLINICAL DATA:  Cough for several weeks EXAM: CHEST - 2 VIEW COMPARISON:  07/19/2019 FINDINGS: The heart size and mediastinal contours are within normal limits. Both lungs are clear. The visualized skeletal structures are unremarkable. IMPRESSION: No active cardiopulmonary disease. Electronically Signed   By: Linus Mako.D.  On: 02/06/2020 12:20    Procedures Procedures (including critical care time)  Medications Ordered in  UC Medications - No data to display  Initial Impression / Assessment and Plan / UC Course  I have reviewed the triage vital signs and the nursing notes.  Pertinent labs & imaging results that were available during my care of the patient were reviewed by me and considered in my medical decision making (see chart for details).   All vital signs are stable.  She is afebrile.  Oxygen is 100%.  No acute distress and chest is clear to auscultation.  Chest x-ray obtained which is within normal limits.  Suspect patient likely has a viral bronchitis and asthma exacerbation.  Treating with prednisone and Tessalon Perles.  I have also sent Diflucan since she states after recent antibiotic use with the doxycycline she developed a yeast infection.  Covid testing also obtained since symptoms worsened over the past couple days.  CDC guidelines, isolation protocol, and ED precautions reviewed with patient.  Work note provided.  Advised her to follow-up with our clinic as needed.   Final Clinical Impressions(s) / UC Diagnoses   Final diagnoses:  Acute bronchitis, unspecified organism  Asthma with acute exacerbation, unspecified asthma severity, unspecified whether persistent  Cough  Yeast infection     Discharge Instructions     Your chest x-ray is normal.  I suspect you are having a flareup of your asthma likely due to viral bronchitis.  Start the prednisone.  And continue use your inhalers.  I also sent something for cough.  Additionally, since you state you have a yeast infection from recent antibiotic use, I have sent Diflucan.  Rest and increase your fluid intake.  Go to ED for any worsening symptoms.  Talk to PCP about nebulizer machine.  You have received COVID testing today either for positive exposure, concerning symptoms that could be related to COVID infection, screening purposes, or re-testing after confirmed positive.  Your test obtained today checks for active viral infection in the  last 1-2 weeks. If your test is negative now, you can still test positive later. So, if you do develop symptoms you should either get re-tested and/or isolate x 5 days and then strict mask use x 5 days (unvaccinated) or mask use x 10 days (vaccinated). Please follow CDC guidelines.  While Rapid antigen tests come back in 15-20 minutes, send out PCR/molecular test results typically come back within 1-3 days. In the mean time, if you are symptomatic, assume this could be a positive test and treat/monitor yourself as if you do have COVID.   We will call with test results if positive. Please download the MyChart app and set up a profile to access test results.   If symptomatic, go home and rest. Push fluids. Take Tylenol as needed for discomfort. Gargle warm salt water. Throat lozenges. Take Mucinex DM or Robitussin for cough. Humidifier in bedroom to ease coughing. Warm showers. Also review the COVID handout for more information.  COVID-19 INFECTION: The incubation period of COVID-19 is approximately 14 days after exposure, with most symptoms developing in roughly 4-5 days. Symptoms may range in severity from mild to critically severe. Roughly 80% of those infected will have mild symptoms. People of any age may become infected with COVID-19 and have the ability to transmit the virus. The most common symptoms include: fever, fatigue, cough, body aches, headaches, sore throat, nasal congestion, shortness of breath, nausea, vomiting, diarrhea, changes in smell and/or taste.  COURSE OF ILLNESS Some patients may begin with mild disease which can progress quickly into critical symptoms. If your symptoms are worsening please call ahead to the Emergency Department and proceed there for further treatment. Recovery time appears to be roughly 1-2 weeks for mild symptoms and 3-6 weeks for severe disease.   GO IMMEDIATELY TO ER FOR FEVER YOU ARE UNABLE TO GET DOWN WITH TYLENOL, BREATHING PROBLEMS, CHEST PAIN, FATIGUE,  LETHARGY, INABILITY TO EAT OR DRINK, ETC  QUARANTINE AND ISOLATION: To help decrease the spread of COVID-19 please remain isolated if you have COVID infection or are highly suspected to have COVID infection. This means -stay home and isolate to one room in the home if you live with others. Do not share a bed or bathroom with others while ill, sanitize and wipe down all countertops and keep common areas clean and disinfected. Stay home for 5 days. If you have no symptoms or your symptoms are resolving after 5 days, you can leave your house. Continue to wear a mask around others for 5 additional days. If you have been in close contact (within 6 feet) of someone diagnosed with COVID 19, you are advised to quarantine in your home for 14 days as symptoms can develop anywhere from 2-14 days after exposure to the virus. If you develop symptoms, you  must isolate.  Most current guidelines for COVID after exposure -unvaccinated: isolate 5 days and strict mask use x 5 days. Test on day 5 is possible -vaccinated: wear mask x 10 days if symptoms do not develop -You do not necessarily need to be tested for COVID if you have + exposure and  develop symptoms. Just isolate at home x10 days from symptom onset During this global pandemic, CDC advises to practice social distancing, try to stay at least 5ft away from others at all times. Wear a face covering. Wash and sanitize your hands regularly and avoid going anywhere that is not necessary.  KEEP IN MIND THAT THE COVID TEST IS NOT 100% ACCURATE AND YOU SHOULD STILL DO EVERYTHING TO PREVENT POTENTIAL SPREAD OF VIRUS TO OTHERS (WEAR MASK, WEAR GLOVES, [place] HANDS AND SANITIZE REGULARLY). IF INITIAL TEST IS NEGATIVE, THIS MAY NOT MEAN YOU ARE DEFINITELY NEGATIVE. MOST ACCURATE TESTING IS DONE 5-7 DAYS AFTER EXPOSURE.   It is not advised by CDC to get re-tested after receiving a positive COVID test since you can still test positive for weeks to months after you have already  cleared the virus.   *If you have not been vaccinated for COVID, I strongly suggest you consider getting vaccinated as long as there are no contraindications.      ED Prescriptions    Medication Sig Dispense Auth. Provider   predniSONE (DELTASONE) 20 MG tablet Take 2 tablets (40 mg total) by mouth daily for 5 days. 10 tablet Laurene Footman B, PA-C   fluconazole (DIFLUCAN) 150 MG tablet Take 1 tablet by mouth every 72 hours as needed for yeast infection 2 tablet Laurene Footman B, PA-C   benzonatate (TESSALON) 100 MG capsule Take 2 capsules (200 mg total) by mouth 3 (three) times daily as needed for up to 10 days for cough. 30 capsule Danton Clap, PA-C     PDMP not reviewed this encounter.   Danton Clap, PA-C 02/06/20 1249

## 2020-02-06 NOTE — ED Triage Notes (Signed)
Pt c/o cough, nasal congestion, runny nose, chills and body aches. Denies fever. She states symptoms started about 3 weeks ago. She has been tested weekly at her job and is negative but requested.

## 2020-02-07 LAB — SARS CORONAVIRUS 2 (TAT 6-24 HRS): SARS Coronavirus 2: POSITIVE — AB

## 2020-05-04 ENCOUNTER — Other Ambulatory Visit: Payer: Self-pay

## 2020-05-04 ENCOUNTER — Encounter: Payer: Self-pay | Admitting: Emergency Medicine

## 2020-05-04 ENCOUNTER — Ambulatory Visit
Admission: EM | Admit: 2020-05-04 | Discharge: 2020-05-04 | Disposition: A | Discharge status: Home / Self Care | Payer: Medicare Other | Attending: Sports Medicine | Admitting: Sports Medicine

## 2020-05-04 DIAGNOSIS — R300 Dysuria: Secondary | ICD-10-CM | POA: Insufficient documentation

## 2020-05-04 DIAGNOSIS — R3915 Urgency of urination: Secondary | ICD-10-CM | POA: Diagnosis not present

## 2020-05-04 DIAGNOSIS — R350 Frequency of micturition: Secondary | ICD-10-CM | POA: Diagnosis not present

## 2020-05-04 DIAGNOSIS — J301 Allergic rhinitis due to pollen: Secondary | ICD-10-CM | POA: Insufficient documentation

## 2020-05-04 LAB — URINALYSIS, COMPLETE (UACMP) WITH MICROSCOPIC
Bilirubin Urine: NEGATIVE
Glucose, UA: NEGATIVE mg/dL
Ketones, ur: NEGATIVE mg/dL
Leukocytes,Ua: NEGATIVE
Nitrite: NEGATIVE
Protein, ur: NEGATIVE mg/dL
Specific Gravity, Urine: 1.02 (ref 1.005–1.030)
pH: 7 (ref 5.0–8.0)

## 2020-05-04 MED ORDER — FLUTICASONE PROPIONATE 50 MCG/ACT NA SUSP: 2.0000 | Freq: Every day | NASAL | 0 refills | Status: AC

## 2020-05-04 MED ORDER — PHENAZOPYRIDINE HCL 200 MG PO TABS: 200.0000 mg | ORAL_TABLET | Freq: Three times a day (TID) | ORAL | 0 refills | Status: DC

## 2020-05-04 NOTE — ED Triage Notes (Signed)
Patient c/o burning when urinating and urinary frequency that started a week.  Patient also reports some left sided abdominal pain.  Patient reports some stuffy nose.

## 2020-05-04 NOTE — ED Provider Notes (Signed)
[facility]    CSN: [id number] Arrival date & time: 05/04/20  1145      History   Chief Complaint Chief Complaint  Patient presents with  . Dysuria    HPI Vanessa Fisher is a 60 y.o. female.   Patient is a pleasant 60 year old female who presents for evaluation of the above issue.  She reports burning and pain with urination for a week.  She also has increased frequency and urgency.  She has been using cranberry juice and Azo.  She denies any hematuria.  A little bit of left-sided flank pain.  She does have a history of renal stones as well as UTIs.  She thought things would get better with outpatient management but given that has been a week she comes in today for evaluation and management.  She denies chest pain or shortness of breath.  She is also reporting some nasal congestion.  She has a history of asthma that is well controlled.  She also has a history of seasonal allergies.  She is requesting a refill on her nasal spray.  She denies fever shakes chills.  No nausea vomiting diarrhea.  No red flag signs or symptoms elicited on history.     Past Medical History:  Diagnosis Date  . Anemia   . Anxiety   . Arthritis   . Asthma    WELL CONTROLLED  . Bile acid malabsorption syndrome   . Bradycardia    HAD AN ISSUE WITH THIS IN 2016-NO PROBLEMS SINCE  . Depression   . Diabetes mellitus without complication ([place])   . Diverticulitis   . Gastric reflux   . GERD (gastroesophageal reflux disease)   . Hand pain 05/12/2016  . Headache    H/O MIGRAINES  . History of kidney stones    H/O  . Neck pain, chronic   . Panic attacks     Patient Active Problem List   Diagnosis Date Noted  . Carpal tunnel syndrome of right wrist 12/06/2017  . Radial styloid tenosynovitis 08/11/2017  . Lumbar facet osteoarthritis 11/10/2016  . Chronic musculoskeletal pain 11/05/2016  . Lumbar radiculitis (Right) (L4) 09/28/2016  . Vitamin D insufficiency 07/30/2016  . Chronic  pain syndrome 07/08/2016  . Long term (current) use of opiate analgesic 07/08/2016  . Long term prescription opiate use 07/08/2016  . Opiate use (30 MME/day) 07/08/2016  . Chronic low back pain (Primary Source of Pain) (midline) ( (Bilateral) (L>R) 07/08/2016  . Disturbance of skin sensation 07/08/2016  . Chronic neck pain (Secondary source of pain) (midline) (Bilateral) (L>R) 07/08/2016  . DDD (degenerative disc disease), cervical 07/08/2016  . DDD (degenerative disc disease), lumbar 07/08/2016  . Cervical spondylosis 07/08/2016  . Cervical radicular pain (Right) (C7/C8) 07/08/2016  . Upper extremity numbness (Right) 07/08/2016  . Cervical facet syndrome (Bilateral) (L>R) 07/08/2016  . Osteoarthritis of knee (Bilateral) (R>L) 07/08/2016  . Chronic lower extremity pain (Right) 07/08/2016  . Lumbar facet syndrome (Bilateral) (L>R) 07/08/2016  . Grade 1 Anterolisthesis of L4 over L5 (3 mm) 07/08/2016  . Grade 1 Retrolisthesis of L5 over S1 (5 mm) 07/08/2016  . Chronic knee pain [facility] source of pain) (Bilateral) (R>L) 05/12/2016    Past Surgical History:  Procedure Laterality Date  . CARPAL TUNNEL RELEASE Right 12/06/2017   Procedure: CARPAL TUNNEL RELEASE;  Surgeon: Earnestine Leys, MD;  Location: [facility] ORS;  Service: Orthopedics;  Laterality: Right;  . DORSAL COMPARTMENT RELEASE Right 12/06/2017   Procedure: RELEASE DORSAL COMPARTMENT (DEQUERVAIN);  Surgeon:  Earnestine Leys, MD;  Location: [facility] ORS;  Service: Orthopedics;  Laterality: Right;  . PARTIAL HYSTERECTOMY    . TUBAL LIGATION      OB History   No obstetric history on file.      Home Medications    Prior to Admission medications   Medication Sig Start Date End Date Taking? Authorizing Provider  glimepiride (AMARYL) 2 MG tablet Take 2 mg by mouth daily. 07/03/17  Yes [provider]  lisinopril (ZESTRIL) 10 MG tablet  10/10/18  Yes [provider]  oxyCODONE-acetaminophen (PERCOCET) 10-325 MG tablet Take  1 tablet by mouth 3 (three) times daily as needed. 05/12/19  Yes [provider]  pantoprazole (PROTONIX) 40 MG tablet Take 40 mg by mouth daily. 02/21/19  Yes [provider]  phenazopyridine (PYRIDIUM) 200 MG tablet Take 1 tablet (200 mg total) by mouth 3 (three) times daily. 05/04/20  Yes Verda Cumins, MD  cetirizine-pseudoephedrine (ZYRTEC-D) 5-120 MG tablet Take 1 tablet by mouth daily. 01/18/20   Rodriguez-Southworth, Sunday Spillers, PA-C  fluconazole (DIFLUCAN) 150 MG tablet Take 1 tablet by mouth every 72 hours as needed for yeast infection 02/06/20   Laurene Footman B, PA-C  fluticasone [facility]) 50 MCG/ACT nasal spray Place 2 sprays into both nostrils daily. 05/04/20   Verda Cumins, MD  ibuprofen (ADVIL) 800 MG tablet Take 800 mg by mouth 2 (two) times daily as needed. 03/15/19   [provider]  metroNIDAZOLE (METROGEL VAGINAL) 0.75 % vaginal gel One applicator qhs x 5 nights 01/18/20   Rodriguez-Southworth, Sunday Spillers, PA-C  ondansetron (ZOFRAN-ODT) 4 MG disintegrating tablet Take 1 tablet (4 mg total) by mouth every 8 (eight) hours as needed. 05/18/19   Karen Kitchens, NP  triamcinolone cream (KENALOG) 0.1 % Apply 1 application topically 2 (two) times daily as needed. 08/09/18   Karen Kitchens, NP  albuterol (PROVENTIL HFA;VENTOLIN HFA) 108 (90 BASE) MCG/ACT inhaler Inhale 2 puffs into the lungs every 6 (six) hours as needed for wheezing or shortness of breath.  11/06/18  [provider]  atorvastatin (LIPITOR) 10 MG tablet Take 10 mg by mouth at bedtime.   11/06/18  [provider]  DULoxetine (CYMBALTA) 20 MG capsule  08/10/18 05/18/19  [provider]  gabapentin (NEURONTIN) 400 MG capsule Take 1 capsule (400 mg total) by mouth 2 (two) times daily. 12/06/17 07/27/18  Earnestine Leys, MD  montelukast (SINGULAIR) 10 MG tablet Take 10 mg by mouth as needed.   07/27/18  [provider]    Family History Family History  Problem Relation Age of Onset  .  Diabetes Mother   . Hypertension Mother   . Heart failure Mother   . Cancer Mother   . Diabetes Father   . Hypertension Father     Social History Social History   Tobacco Use  . Smoking status: Never Smoker  . Smokeless tobacco: Never Used  Vaping Use  . Vaping Use: Never used  Substance Use Topics  . Alcohol use: No  . Drug use: No     Allergies   Bc powder [aspirin-salicylamide-caffeine], Gabapentin, Hydrocodone-acetaminophen, Latex, Penicillin g, Penicillins, and Shrimp [shellfish allergy]   Review of Systems Review of Systems  Constitutional: Negative.  Negative for activity change, appetite change, chills, diaphoresis, fatigue and fever.  HENT: Positive for congestion. Negative for ear pain, postnasal drip, rhinorrhea, sinus pressure, sinus pain, sneezing and sore throat.   Eyes: Negative.  Negative for pain.  Respiratory: Negative.  Negative for cough, chest tightness, shortness  of breath, wheezing and stridor.   Cardiovascular: Negative.  Negative for chest pain and palpitations.  Gastrointestinal: Positive for abdominal pain. Negative for blood in stool, constipation, diarrhea, nausea and vomiting.  Genitourinary: Positive for dysuria, frequency and urgency. Negative for flank pain, hematuria, pelvic pain, vaginal bleeding, vaginal discharge and vaginal pain.  Musculoskeletal: Negative for back pain, myalgias, neck pain and neck stiffness.  Skin: Negative for color change, pallor, rash and wound.  Neurological: Negative for dizziness, seizures, syncope, light-headedness, numbness and headaches.  All other systems reviewed and are negative.    Physical Exam Triage Vital Signs ED Triage Vitals  Enc Vitals Group     BP 05/04/20 1158 (!) 115/92     Pulse Rate 05/04/20 1158 73     Resp 05/04/20 1158 14     Temp 05/04/20 1158 98 F (36.7 C)     Temp Source 05/04/20 1158 Oral     SpO2 05/04/20 1158 98 %     Weight 05/04/20 1155 220 lb (99.8 kg)     Height  05/04/20 1155 5\' 7"  (1.702 m)     Head Circumference --      Peak Flow --      Pain Score 05/04/20 1155 9     Pain Loc --      Pain Edu? --      Excl. in [place]? --    No data found.  Updated Vital Signs BP (!) 115/92 (BP Location: Left Arm)   Pulse 73   Temp 98 F (36.7 C) (Oral)   Resp 14   Ht 5\' 7"  (1.702 m)   Wt 99.8 kg   SpO2 98%   BMI 34.46 kg/m   Visual Acuity Right Eye Distance:   Left Eye Distance:   Bilateral Distance:    Right Eye Near:   Left Eye Near:    Bilateral Near:     Physical Exam Vitals and nursing note reviewed.  Constitutional:      General: She is not in acute distress.    Appearance: Normal appearance. She is not ill-appearing, toxic-appearing or diaphoretic.  HENT:     Head: Normocephalic and atraumatic.     Nose: Congestion present. No rhinorrhea.     Mouth/Throat:     Mouth: Mucous membranes are moist.  Cardiovascular:     Rate and Rhythm: Normal rate and regular rhythm.     Pulses: Normal pulses.     Heart sounds: Normal heart sounds. No murmur heard. No friction rub. No gallop.   Pulmonary:     Effort: Pulmonary effort is normal. No respiratory distress.     Breath sounds: Normal breath sounds. No stridor. No wheezing, rhonchi or rales.  Abdominal:     General: Abdomen is flat. Bowel sounds are normal. There is no distension.     Tenderness: There is no abdominal tenderness. There is no right CVA tenderness, left CVA tenderness, guarding or rebound.  Musculoskeletal:     Cervical back: Normal range of motion and neck supple.  Skin:    General: Skin is warm and dry.     Capillary Refill: Capillary refill takes less than 2 seconds.  Neurological:     General: No focal deficit present.     Mental Status: She is alert and oriented to person, place, and time.  Psychiatric:        Mood and Affect: Mood normal.      UC Treatments / Results  Labs (all labs ordered are listed, but  only abnormal results are displayed) Labs Reviewed   URINALYSIS, COMPLETE (UACMP) WITH MICROSCOPIC - Abnormal; Notable for the following components:      Result Value   Hgb urine dipstick TRACE (*)    Bacteria, UA FEW (*)    All other components within normal limits    EKG   Radiology No results found.  Procedures Procedures (including critical care time)  Medications Ordered in UC Medications - No data to display  Initial Impression / Assessment and Plan / UC Course  I have reviewed the triage vital signs and the nursing notes.  Pertinent labs & imaging results that were available during my care of the patient were reviewed by me and considered in my medical decision making (see chart for details).  Clinical impression: 1.  Dysuria with increased frequency and urgency for 1 week.  She has failed outpatient management. 2.  History of seasonal allergies and asthma requesting a refill on her nasal spray.   Treatment plan: 1.  The findings and treatment plan were discussed in detail with the patient.  Patient was in agreement. 2.  We will check a UA.  There was trace blood and a few bacteria.  Probably a contaminant.  Will not send off a culture. 3.  Continue with supportive care, plenty of fluids, cranberry juice and Azo.  I added in Pyridium to see if that helps with the burning sensation that she has. 4.  Regarding her congestion and seasonal allergies I went ahead and renewed her Flonase. 5.  Educational handouts provided. 6.  Over-the-counter meds as needed, Tylenol or Motrin for any fever or discomfort.  Plenty of rest, plenty of fluids. 7.  I offered a work note she said she did not need one. 8.  If symptoms persist she should follow up with her primary care provider.  Certainly if they worsen in any way she should go to the emergency room. 9.  Follow-up here as needed.    Final Clinical Impressions(s) / UC Diagnoses   Final diagnoses:  Dysuria  Urinary frequency  Urinary urgency  Seasonal allergic rhinitis due to  pollen     Discharge Instructions     We will check a UA.  There was trace blood and a few bacteria.  Probably a contaminant.  Will not send off a culture. Continue with supportive care, plenty of fluids, cranberry juice and Azo.  I added in Pyridium to see if that helps with the burning sensation. Regarding your congestion and seasonal allergies I went ahead and renewed your Flonase. Educational handouts provided. Over-the-counter meds as needed, Tylenol or Motrin for any fever or discomfort.  Plenty of rest, plenty of fluids. If symptoms persist you should follow up with your primary care provider.  Certainly if they worsen in any way you should go to the emergency room. Follow-up here as needed.    ED Prescriptions    Medication Sig Dispense Auth. Provider   fluticasone (FLONASE) 50 MCG/ACT nasal spray Place 2 sprays into both nostrils daily. 15.8 mL Verda Cumins, MD   phenazopyridine (PYRIDIUM) 200 MG tablet Take 1 tablet (200 mg total) by mouth 3 (three) times daily. 6 tablet Verda Cumins, MD     PDMP not reviewed this encounter.   Verda Cumins, MD 05/06/20 2031

## 2020-05-04 NOTE — Discharge Instructions (Addendum)
We will check a UA.  There was trace blood and a few bacteria.  Probably a contaminant.  Will not send off a culture. Continue with supportive care, plenty of fluids, cranberry juice and Azo.  I added in Pyridium to see if that helps with the burning sensation. Regarding your congestion and seasonal allergies I went ahead and renewed your Flonase. Educational handouts provided. Over-the-counter meds as needed, Tylenol or Motrin for any fever or discomfort.  Plenty of rest, plenty of fluids. If symptoms persist you should follow up with your primary care provider.  Certainly if they worsen in any way you should go to the emergency room. Follow-up here as needed.

## 2020-05-23 ENCOUNTER — Other Ambulatory Visit: Payer: Self-pay

## 2020-05-23 ENCOUNTER — Emergency Department: Payer: No Typology Code available for payment source

## 2020-05-23 ENCOUNTER — Emergency Department
Admission: EM | Admit: 2020-05-23 | Discharge: 2020-05-23 | Disposition: A | Discharge status: Home / Self Care | Payer: No Typology Code available for payment source | Attending: Emergency Medicine | Admitting: Emergency Medicine

## 2020-05-23 ENCOUNTER — Encounter: Payer: Self-pay | Admitting: Emergency Medicine

## 2020-05-23 DIAGNOSIS — Z91040 Latex allergy status: Secondary | ICD-10-CM | POA: Diagnosis not present

## 2020-05-23 DIAGNOSIS — J45909 Unspecified asthma, uncomplicated: Secondary | ICD-10-CM | POA: Diagnosis not present

## 2020-05-23 DIAGNOSIS — S9001XA Contusion of right ankle, initial encounter: Secondary | ICD-10-CM | POA: Insufficient documentation

## 2020-05-23 DIAGNOSIS — E119 Type 2 diabetes mellitus without complications: Secondary | ICD-10-CM | POA: Diagnosis not present

## 2020-05-23 DIAGNOSIS — Z7984 Long term (current) use of oral hypoglycemic drugs: Secondary | ICD-10-CM | POA: Insufficient documentation

## 2020-05-23 DIAGNOSIS — S99911A Unspecified injury of right ankle, initial encounter: Secondary | ICD-10-CM | POA: Diagnosis present

## 2020-05-23 DIAGNOSIS — M25512 Pain in left shoulder: Secondary | ICD-10-CM | POA: Diagnosis not present

## 2020-05-23 DIAGNOSIS — Z7951 Long term (current) use of inhaled steroids: Secondary | ICD-10-CM | POA: Insufficient documentation

## 2020-05-23 DIAGNOSIS — Y92410 Unspecified street and highway as the place of occurrence of the external cause: Secondary | ICD-10-CM | POA: Diagnosis not present

## 2020-05-23 MED ORDER — IBUPROFEN 600 MG PO TABS
600.0000 mg | ORAL_TABLET | Freq: Once | ORAL | Status: AC
  Administered 2020-05-23: 600 mg via ORAL
  Filled 2020-05-23: qty 1

## 2020-05-23 MED ORDER — IBUPROFEN 800 MG PO TABS: 800.0000 mg | ORAL_TABLET | Freq: Three times a day (TID) | ORAL | 0 refills | Status: DC | PRN

## 2020-05-23 MED ORDER — CYCLOBENZAPRINE HCL 10 MG PO TABS: 10.0000 mg | ORAL_TABLET | Freq: Three times a day (TID) | ORAL | 0 refills | Status: DC | PRN

## 2020-05-23 MED ORDER — CYCLOBENZAPRINE HCL 10 MG PO TABS
10.0000 mg | ORAL_TABLET | Freq: Once | ORAL | Status: AC
  Administered 2020-05-23: 10 mg via ORAL
  Filled 2020-05-23: qty 1
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
## 2020-05-23 NOTE — ED Notes (Signed)
Pt esignature on paper due to pad not beign connected to computer

## 2020-07-13 ENCOUNTER — Emergency Department
Admission: EM | Admit: 2020-07-13 | Discharge: 2020-07-13 | Disposition: A | Discharge status: Home / Self Care | Payer: Medicare Other | Attending: Emergency Medicine | Admitting: Emergency Medicine

## 2020-07-13 ENCOUNTER — Other Ambulatory Visit: Payer: Self-pay

## 2020-07-13 ENCOUNTER — Emergency Department: Payer: Medicare Other

## 2020-07-13 ENCOUNTER — Encounter: Payer: Self-pay | Admitting: Emergency Medicine

## 2020-07-13 DIAGNOSIS — Z91040 Latex allergy status: Secondary | ICD-10-CM | POA: Insufficient documentation

## 2020-07-13 DIAGNOSIS — K219 Gastro-esophageal reflux disease without esophagitis: Secondary | ICD-10-CM | POA: Diagnosis not present

## 2020-07-13 DIAGNOSIS — Z7984 Long term (current) use of oral hypoglycemic drugs: Secondary | ICD-10-CM | POA: Diagnosis not present

## 2020-07-13 DIAGNOSIS — J45909 Unspecified asthma, uncomplicated: Secondary | ICD-10-CM | POA: Diagnosis not present

## 2020-07-13 DIAGNOSIS — R1032 Left lower quadrant pain: Secondary | ICD-10-CM

## 2020-07-13 DIAGNOSIS — Z7952 Long term (current) use of systemic steroids: Secondary | ICD-10-CM | POA: Diagnosis not present

## 2020-07-13 DIAGNOSIS — E119 Type 2 diabetes mellitus without complications: Secondary | ICD-10-CM | POA: Insufficient documentation

## 2020-07-13 DIAGNOSIS — Z8719 Personal history of other diseases of the digestive system: Secondary | ICD-10-CM | POA: Insufficient documentation

## 2020-07-13 LAB — CBC
HCT: 36.5 % (ref 36.0–46.0)
Hemoglobin: 11.6 g/dL — ABNORMAL LOW (ref 12.0–15.0)
MCH: 25.2 pg — ABNORMAL LOW (ref 26.0–34.0)
MCHC: 31.8 g/dL (ref 30.0–36.0)
MCV: 79.2 fL — ABNORMAL LOW (ref 80.0–100.0)
Platelets: 309 10*3/uL (ref 150–400)
RBC: 4.61 MIL/uL (ref 3.87–5.11)
RDW: 14.6 % (ref 11.5–15.5)
WBC: 4.9 10*3/uL (ref 4.0–10.5)
nRBC: 0 % (ref 0.0–0.2)

## 2020-07-13 LAB — URINALYSIS, COMPLETE (UACMP) WITH MICROSCOPIC
Bacteria, UA: NONE SEEN
Bilirubin Urine: NEGATIVE
Glucose, UA: NEGATIVE mg/dL
Hgb urine dipstick: NEGATIVE
Ketones, ur: NEGATIVE mg/dL
Nitrite: NEGATIVE
Protein, ur: NEGATIVE mg/dL
Specific Gravity, Urine: 1.014 (ref 1.005–1.030)
pH: 7 (ref 5.0–8.0)

## 2020-07-13 LAB — COMPREHENSIVE METABOLIC PANEL
ALT: 27 U/L (ref 0–44)
AST: 27 U/L (ref 15–41)
Albumin: 3.7 g/dL (ref 3.5–5.0)
Alkaline Phosphatase: 104 U/L (ref 38–126)
Anion gap: 6 (ref 5–15)
BUN: 8 mg/dL (ref 6–20)
CO2: 28 mmol/L (ref 22–32)
Calcium: 9 mg/dL (ref 8.9–10.3)
Chloride: 108 mmol/L (ref 98–111)
Creatinine, Ser: 0.74 mg/dL (ref 0.44–1.00)
GFR, Estimated: >60 mL/min (ref >60)
Glucose, Bld: 119 mg/dL — ABNORMAL HIGH (ref 70–99)
Potassium: 3.3 mmol/L — ABNORMAL LOW (ref 3.5–5.1)
Sodium: 142 mmol/L (ref 135–145)
Total Bilirubin: 0.7 mg/dL (ref 0.3–1.2)
Total Protein: 7 g/dL (ref 6.5–8.1)

## 2020-07-13 LAB — LIPASE, BLOOD: Lipase: 31 U/L (ref 11–51)

## 2020-07-13 MED ORDER — PREDNISONE 10 MG PO TABS: 50.0000 mg | ORAL_TABLET | Freq: Every day | ORAL | 0 refills | Status: DC

## 2020-07-13 NOTE — ED Triage Notes (Signed)
Pt via POV from home. Pt was sent from [facility]. Pt c/o LLQ abd pain for a week. Denies NV. Pt states that she did have some diarrhea on Monday. Denies urinary symptoms. Pt has a hx of diverticulitis and Crohns Disease. Pt is A&Ox4 and NAD

## 2020-07-13 NOTE — ED Notes (Signed)
Patient reports LLQ pain intermittently for a few days. Patient reports hx of Crohns and acid reflux, and reports taking Protonix without relief. Patient reports the urgent care sent her here to be seen for evaluation for diverticulitis. Patient reports some diarrhea at the beginning of the week, but denies any recently. Patient denies urinary symptoms.

## 2020-07-13 NOTE — ED Provider Notes (Signed)
[facility] Emergency Department Provider Note ____________________________________________   Event Date/Time   First MD Initiated Contact with Patient 07/13/20 1350     (approximate)  I have reviewed the triage vital signs and the nursing notes.   HISTORY  Chief Complaint Abdominal Pain  HPI Vanessa Fisher is a 60 y.o. female with history of diabetes, presents to the emergency department for treatment and evaluation of left lower quadrant abdominal pain.  She states that she has also been told in the past that she has diverticulosis.  She states that she has some diarrhea a couple weeks ago with obvious blood but that has completely cleared.  Normal bowel movement now.  She has some nausea without vomiting.  No known fever.  No dysuria.         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/06/2017   Radial styloid tenosynovitis 08/11/2017   Lumbar facet osteoarthritis 11/10/2016   Chronic musculoskeletal pain 11/05/2016   Lumbar radiculitis (Right) (L4) 09/28/2016   Vitamin D insufficiency 07/30/2016   Chronic pain syndrome 07/08/2016   Long term (current) use of opiate analgesic 07/08/2016   Long term prescription opiate use 07/08/2016   Opiate use (30 MME/day) 07/08/2016   Chronic low back pain (Primary Source of Pain) (midline) ( (Bilateral) (L>R) 07/08/2016   Disturbance of skin sensation 07/08/2016   Chronic neck  pain (Secondary source of pain) (midline) (Bilateral) (L>R) 07/08/2016   DDD (degenerative disc disease), cervical 07/08/2016   DDD (degenerative disc disease), lumbar 07/08/2016   Cervical spondylosis 07/08/2016   Cervical radicular pain (Right) (C7/C8) 07/08/2016   Upper extremity numbness (Right) 07/08/2016   Cervical facet syndrome (Bilateral) (L>R) 07/08/2016   Osteoarthritis of knee (Bilateral) (R>L) 07/08/2016   Chronic lower extremity pain (Right) 07/08/2016   Lumbar facet syndrome (Bilateral) (L>R) 07/08/2016   Grade 1 Anterolisthesis of L4 over L5 (3 mm) 07/08/2016   Grade 1 Retrolisthesis of L5 over S1 (5 mm) 07/08/2016   Chronic knee pain (Tertiary source of pain) (Bilateral) (R>L) 05/12/2016    Past Surgical History:  Procedure Laterality Date   CARPAL TUNNEL RELEASE Right 12/06/2017   Procedure: CARPAL TUNNEL RELEASE;  Surgeon: Earnestine Leys, MD;  Location: [facility] ORS;  Service: Orthopedics;  Laterality: Right;   DORSAL COMPARTMENT RELEASE Right 12/06/2017   Procedure: RELEASE DORSAL COMPARTMENT (DEQUERVAIN);  Surgeon: Earnestine Leys, MD;  Location: [facility] ORS;  Service: Orthopedics;  Laterality: Right;   PARTIAL HYSTERECTOMY     TUBAL LIGATION      Prior to Admission medications   Medication Sig Start Date End Date Taking? Authorizing Provider  predniSONE (DELTASONE) 10 MG tablet Take 5 tablets (50 mg total) by mouth daily. 07/13/20  Yes Joelie Schou B, FNP  cetirizine-pseudoephedrine (ZYRTEC-D) 5-120 MG tablet Take  1 tablet by mouth daily. 01/18/20   Rodriguez-Southworth, Sunday Spillers, PA-C  cyclobenzaprine (FLEXERIL) 10 MG tablet Take 1 tablet (10 mg total) by mouth 3 (three) times daily as needed for muscle spasms. 05/23/20   Rudene Re, MD  fluconazole (DIFLUCAN) 150 MG tablet Take 1 tablet by mouth every 72 hours as needed for yeast infection 02/06/20   Laurene Footman B, PA-C  fluticasone [facility]) 50 MCG/ACT nasal spray Place 2 sprays into both nostrils daily. 05/04/20    Verda Cumins, MD  glimepiride (AMARYL) 2 MG tablet Take 2 mg by mouth daily. 07/03/17   [provider]  ibuprofen (ADVIL) 800 MG tablet Take 1 tablet (800 mg total) by mouth every 8 (eight) hours as needed. 05/23/20   Rudene Re, MD  lisinopril (ZESTRIL) 10 MG tablet  10/10/18   [provider]  metroNIDAZOLE (METROGEL VAGINAL) 0.75 % vaginal gel One applicator qhs x 5 nights 01/18/20   Rodriguez-Southworth, Sunday Spillers, PA-C  ondansetron (ZOFRAN-ODT) 4 MG disintegrating tablet Take 1 tablet (4 mg total) by mouth every 8 (eight) hours as needed. 05/18/19   Karen Kitchens, NP  oxyCODONE-acetaminophen (PERCOCET) 10-325 MG tablet Take 1 tablet by mouth 3 (three) times daily as needed. 05/12/19   [provider]  pantoprazole (PROTONIX) 40 MG tablet Take 40 mg by mouth daily. 02/21/19   [provider]  phenazopyridine (PYRIDIUM) 200 MG tablet Take 1 tablet (200 mg total) by mouth 3 (three) times daily. 05/04/20   Verda Cumins, MD  triamcinolone cream (KENALOG) 0.1 % Apply 1 application topically 2 (two) times daily as needed. 08/09/18   Karen Kitchens, NP  albuterol (PROVENTIL HFA;VENTOLIN HFA) 108 (90 BASE) MCG/ACT inhaler Inhale 2 puffs into the lungs every 6 (six) hours as needed for wheezing or shortness of breath.  11/06/18  [provider]  atorvastatin (LIPITOR) 10 MG tablet Take 10 mg by mouth at bedtime.   11/06/18  [provider]  DULoxetine (CYMBALTA) 20 MG capsule  08/10/18 05/18/19  [provider]  gabapentin (NEURONTIN) 400 MG capsule Take 1 capsule (400 mg total) by mouth 2 (two) times daily. 12/06/17 07/27/18  Earnestine Leys, MD  montelukast (SINGULAIR) 10 MG tablet Take 10 mg by mouth as needed.   07/27/18  [provider]    Allergies Bc powder [aspirin-salicylamide-caffeine], Gabapentin, Hydrocodone-acetaminophen, Latex, Penicillin g, Penicillins, and Shrimp [shellfish allergy]  Family History  Problem Relation Age of  Onset   Diabetes Mother    Hypertension Mother    Heart failure Mother    Cancer Mother    Diabetes Father    Hypertension Father     Social History Social History   Tobacco Use   Smoking status: Never   Smokeless tobacco: Never  Vaping Use   Vaping Use: Never used  Substance Use Topics   Alcohol use: No   Drug use: No    Review of Systems  Constitutional: No fever/chills Eyes: No visual changes. ENT: No sore throat. Cardiovascular: Denies chest pain. Respiratory: Denies shortness of breath. Gastrointestinal: Positive for abdominal pain.  Positive for nausea, no vomiting.  No diarrhea.  No constipation. Genitourinary: Negative for dysuria. Musculoskeletal: Negative for back pain. Skin: Negative for rash. Neurological: Negative for headaches, focal weakness or numbness.  ____________________________________________   PHYSICAL EXAM:  VITAL SIGNS: ED Triage Vitals  Enc Vitals Group     BP 07/13/20 1239 (!) 159/61     Pulse Rate 07/13/20 1239 81     Resp 07/13/20 1239 16  Temp 07/13/20 1239 98 F (36.7 C)     Temp Source 07/13/20 1239 Oral     SpO2 07/13/20 1239 98 %     Weight 07/13/20 1231 245 lb (111.1 kg)     Height 07/13/20 1231 5\' 6"  (1.676 m)     Head Circumference --      Peak Flow --      Pain Score 07/13/20 1231 0     Pain Loc --      Pain Edu? --      Excl. in [place]? --     Constitutional: Alert and oriented. Well appearing and in no acute distress. Eyes: Conjunctivae are normal. PERRL. EOMI. Head: Atraumatic. Nose: No congestion/rhinnorhea. Mouth/Throat: Mucous membranes are moist.  Oropharynx non-erythematous. Neck: No stridor.   Hematological/Lymphatic/Immunilogical: No cervical lymphadenopathy. Cardiovascular: Normal rate, regular rhythm. Grossly normal heart sounds.  Good peripheral circulation. Respiratory: Normal respiratory effort.  No retractions. Lungs CTAB. Gastrointestinal: Soft and nontender. No distention. No abdominal bruits.  No CVA tenderness. Genitourinary:  Musculoskeletal: No lower extremity tenderness nor edema.  No joint effusions. Neurologic:  Normal speech and language. No gross focal neurologic deficits are appreciated. No gait instability. Skin:  Skin is warm, dry and intact. No rash noted. Psychiatric: Mood and affect are normal. Speech and behavior are normal.  ____________________________________________   LABS (all labs ordered are listed, but only abnormal results are displayed)  Labs Reviewed  COMPREHENSIVE METABOLIC PANEL - Abnormal; Notable for the following components:      Result Value   Potassium 3.3 (*)    Glucose, Bld 119 (*)    All other components within normal limits  CBC - Abnormal; Notable for the following components:   Hemoglobin 11.6 (*)    MCV 79.2 (*)    MCH 25.2 (*)    All other components within normal limits  URINALYSIS, COMPLETE (UACMP) WITH MICROSCOPIC - Abnormal; Notable for the following components:   Color, Urine YELLOW (*)    APPearance CLEAR (*)    Leukocytes,Ua TRACE (*)    All other components within normal limits  LIPASE, BLOOD   ____________________________________________  EKG  Not indicated ____________________________________________  RADIOLOGY  ED MD interpretation:    CT abdomen and pelvis negative for acute concerns.  I, Sherrie George, personally viewed and evaluated these images (plain radiographs) as part of my medical decision making, as well as reviewing the written report by the radiologist.  Official radiology report(s): CT ABDOMEN PELVIS WO CONTRAST  Result Date: 07/13/2020 CLINICAL DATA:  Suspect diverticulitis due to left lower quadrant pain 1 week. Some diarrhea. History of Crohn's disease. EXAM: CT ABDOMEN AND PELVIS WITHOUT CONTRAST TECHNIQUE: Multidetector CT imaging of the abdomen and pelvis was performed following the standard protocol without IV contrast. COMPARISON:  04/05/2010 FINDINGS: Lower chest: Lung bases are clear.  Hepatobiliary: Liver, gallbladder and biliary tree are normal. Pancreas: Normal. Spleen: Normal. Adrenals/Urinary Tract: Adrenal glands are normal. Kidneys are normal in size without hydronephrosis or nephrolithiasis. Stomach/Bowel: Stomach and small bowel are normal. Appendix is normal. There is diverticulosis of the distal descending and sigmoid colon without active inflammation. Vascular/Lymphatic: Abdominal aorta is normal in caliber. No evidence of adenopathy. Reproductive: Prior hysterectomy.  Adnexal regions unremarkable. Other: No free fluid or focal inflammatory change. Musculoskeletal: Degenerative disc disease at the L5-S1 level. IMPRESSION: 1. No acute findings in the abdomen/pelvis. 2. Colonic diverticulosis without active inflammation. Electronically Signed   By: Marin Olp M.D.   On: 07/13/2020 15:44    ____________________________________________  PROCEDURES  Procedure(s) performed (including Critical Care):  Procedures  ____________________________________________   INITIAL IMPRESSION / ASSESSMENT AND PLAN     60 year old female presenting to the emergency department for treatment and evaluation of left lower quadrant abdominal pain.  See HPI for further details.  Labs drawn while awaiting ER room assignment are overall reassuring.  She does have very mild hypokalemia at 3.3, hemoglobin is 11.6 which appears to be at patient's baseline.  Does not appear that she has had any imaging of her abdomen since last year.  Plan will be to get a CT to rule out diverticulitis.  DIFFERENTIAL DIAGNOSIS  Diverticulitis, diverticulosis, gastroenteritis, small bowel obstruction, ileus  ED COURSE  CT of the abdomen and pelvis is reassuring.  There is no indication of acute findings.  No diverticulitis noted. She has also mentioned a remote finding of Chron's, but I am unable to find any official diagnosis in chart review. Will treat with prednisone for inflammatory process that may be  causing her pain. She is to follow up with GI and PCP. For symptoms of concern, if unable to schedule an appointment, she is to return to the ER.    ___________________________________________   FINAL CLINICAL IMPRESSION(S) / ED DIAGNOSES  Final diagnoses:  Acute left lower quadrant pain     ED Discharge Orders          Ordered    predniSONE (DELTASONE) 10 MG tablet  Daily        07/13/20 1600             Vanessa Fisher was evaluated in Emergency Department on 07/14/2020 for the symptoms described in the history of present illness. She was evaluated in the context of the global COVID-19 pandemic, which necessitated consideration that the patient might be at risk for infection with the SARS-CoV-2 virus that causes COVID-19. Institutional protocols and algorithms that pertain to the evaluation of patients at risk for COVID-19 are in a state of rapid change based on information released by regulatory bodies including the CDC and federal and state organizations. These policies and algorithms were followed during the patient's care in the ED.   Note:  This document was prepared using Dragon voice recognition software and may include unintentional dictation errors.    Victorino Dike, FNP 07/14/20 1354    Harvest Dark, MD 07/14/20 1357

## 2020-07-13 NOTE — Discharge Instructions (Addendum)
Your CT does not show any sign of infection in your abdomen.   We will try treating you with prednisone in hope to decrease any inflammation that may be causing your pain.  Please call and schedule a follow up with your PCP as well as GI doctor.  Return to the ER for symptoms that change or worsen if unable to schedule an appointment.

## 2020-08-12 ENCOUNTER — Emergency Department: Payer: Medicare Other

## 2020-08-12 ENCOUNTER — Emergency Department
Admission: EM | Admit: 2020-08-12 | Discharge: 2020-08-12 | Disposition: A | Discharge status: Home / Self Care | Payer: Medicare Other | Attending: Emergency Medicine | Admitting: Emergency Medicine

## 2020-08-12 ENCOUNTER — Other Ambulatory Visit: Payer: Self-pay

## 2020-08-12 DIAGNOSIS — Z91040 Latex allergy status: Secondary | ICD-10-CM | POA: Insufficient documentation

## 2020-08-12 DIAGNOSIS — N3010 Interstitial cystitis (chronic) without hematuria: Secondary | ICD-10-CM | POA: Diagnosis not present

## 2020-08-12 DIAGNOSIS — Z79899 Other long term (current) drug therapy: Secondary | ICD-10-CM | POA: Diagnosis not present

## 2020-08-12 DIAGNOSIS — E119 Type 2 diabetes mellitus without complications: Secondary | ICD-10-CM | POA: Diagnosis not present

## 2020-08-12 DIAGNOSIS — R0981 Nasal congestion: Secondary | ICD-10-CM | POA: Diagnosis present

## 2020-08-12 DIAGNOSIS — J45909 Unspecified asthma, uncomplicated: Secondary | ICD-10-CM | POA: Insufficient documentation

## 2020-08-12 DIAGNOSIS — Z7984 Long term (current) use of oral hypoglycemic drugs: Secondary | ICD-10-CM | POA: Diagnosis not present

## 2020-08-12 DIAGNOSIS — J0100 Acute maxillary sinusitis, unspecified: Secondary | ICD-10-CM

## 2020-08-12 LAB — URINALYSIS, COMPLETE (UACMP) WITH MICROSCOPIC
Bacteria, UA: NONE SEEN
Bilirubin Urine: NEGATIVE
Glucose, UA: NEGATIVE mg/dL
Ketones, ur: NEGATIVE mg/dL
Leukocytes,Ua: NEGATIVE
Nitrite: NEGATIVE
Protein, ur: NEGATIVE mg/dL
Specific Gravity, Urine: 1.02 (ref 1.005–1.030)
pH: 5 (ref 5.0–8.0)

## 2020-08-12 NOTE — ED Provider Notes (Signed)
[facility] Emergency Department Provider Note  ____________________________________________  Time seen: Approximately 6:42 PM  I have reviewed the triage vital signs and the nursing notes.   HISTORY  Chief Complaint Nasal Congestion and Urinary Frequency    HPI Vanessa Fisher is a 60 y.o. female who presents the emergency department complaining of 2 symptoms.  Patient is having sinus congestion, sinus pressure, postnasal drainage.  Symptoms have been ongoing times several days.  She does have a history of allergies and does take Claritin and Flonase.  These medicines have not been improving her symptoms.  No sore throat or cough.  Patient denies any visual changes, chest pain, shortness of breath.  Patient is also complaining of some flank pain and dysuria.  She is unsure whether she may have a urinary tract infection but does not have any suprapubic or abdominal pain.  No hematuria.  No vaginal bleeding or discharge.  Patient does have a history of nephrolithiasis.       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/06/2017   Radial styloid tenosynovitis 08/11/2017   Lumbar facet osteoarthritis 11/10/2016   Chronic musculoskeletal pain 11/05/2016   Lumbar radiculitis (Right) (L4) 09/28/2016   Vitamin D insufficiency 07/30/2016   Chronic pain syndrome 07/08/2016   Long term (current) use of opiate analgesic 07/08/2016   Long  term prescription opiate use 07/08/2016   Opiate use (30 MME/day) 07/08/2016   Chronic low back pain (Primary Source of Pain) (midline) ( (Bilateral) (L>R) 07/08/2016   Disturbance of skin sensation 07/08/2016   Chronic neck pain (Secondary source of pain) (midline) (Bilateral) (L>R) 07/08/2016   DDD (degenerative disc disease), cervical 07/08/2016   DDD (degenerative disc disease), lumbar 07/08/2016   Cervical spondylosis 07/08/2016   Cervical radicular pain (Right) (C7/C8) 07/08/2016   Upper extremity numbness (Right) 07/08/2016   Cervical facet syndrome (Bilateral) (L>R) 07/08/2016   Osteoarthritis of knee (Bilateral) (R>L) 07/08/2016   Chronic lower extremity pain (Right) 07/08/2016   Lumbar facet syndrome (Bilateral) (L>R) 07/08/2016   Grade 1 Anterolisthesis of L4 over L5 (3 mm) 07/08/2016   Grade 1 Retrolisthesis of L5 over S1 (5 mm) 07/08/2016   Chronic knee pain (Tertiary source of pain) (Bilateral) (R>L) 05/12/2016    Past Surgical History:  Procedure Laterality Date   CARPAL TUNNEL RELEASE Right 12/06/2017   Procedure: CARPAL TUNNEL RELEASE;  Surgeon: Earnestine Leys, MD;  Location: [facility] ORS;  Service: Orthopedics;  Laterality: Right;   DORSAL COMPARTMENT RELEASE Right 12/06/2017   Procedure: RELEASE DORSAL COMPARTMENT (DEQUERVAIN);  Surgeon: Earnestine Leys, MD;  Location: [facility] ORS;  Service: Orthopedics;  Laterality: Right;   PARTIAL HYSTERECTOMY     TUBAL LIGATION      Prior to Admission medications   Medication Sig Start Date End Date  Taking? Authorizing Provider  cetirizine-pseudoephedrine (ZYRTEC-D) 5-120 MG tablet Take 1 tablet by mouth daily. 01/18/20   Rodriguez-Southworth, Sunday Spillers, PA-C  fluconazole (DIFLUCAN) 150 MG tablet Take 1 tablet by mouth every 72 hours as needed for yeast infection 02/06/20   Laurene Footman B, PA-C  fluticasone [facility]) 50 MCG/ACT nasal spray Place 2 sprays into both nostrils daily. 05/04/20   Verda Cumins, MD  glimepiride (AMARYL) 2 MG  tablet Take 2 mg by mouth daily. 07/03/17   [provider]  ibuprofen (ADVIL) 800 MG tablet Take 1 tablet (800 mg total) by mouth every 8 (eight) hours as needed. 05/23/20   Rudene Re, MD  lisinopril (ZESTRIL) 10 MG tablet  10/10/18   [provider]  metroNIDAZOLE (METROGEL VAGINAL) 0.75 % vaginal gel One applicator qhs x 5 nights 01/18/20   Rodriguez-Southworth, Sunday Spillers, PA-C  ondansetron (ZOFRAN-ODT) 4 MG disintegrating tablet Take 1 tablet (4 mg total) by mouth every 8 (eight) hours as needed. 05/18/19   Karen Kitchens, NP  oxyCODONE-acetaminophen (PERCOCET) 10-325 MG tablet Take 1 tablet by mouth 3 (three) times daily as needed. 05/12/19   [provider]  pantoprazole (PROTONIX) 40 MG tablet Take 40 mg by mouth daily. 02/21/19   [provider]  phenazopyridine (PYRIDIUM) 200 MG tablet Take 1 tablet (200 mg total) by mouth 3 (three) times daily. 05/04/20   Verda Cumins, MD  predniSONE (DELTASONE) 10 MG tablet Take 5 tablets (50 mg total) by mouth daily. 07/13/20   Triplett, Johnette Abraham B, FNP  triamcinolone cream (KENALOG) 0.1 % Apply 1 application topically 2 (two) times daily as needed. 08/09/18   Karen Kitchens, NP  albuterol (PROVENTIL HFA;VENTOLIN HFA) 108 (90 BASE) MCG/ACT inhaler Inhale 2 puffs into the lungs every 6 (six) hours as needed for wheezing or shortness of breath.  11/06/18  [provider]  atorvastatin (LIPITOR) 10 MG tablet Take 10 mg by mouth at bedtime.   11/06/18  [provider]  DULoxetine (CYMBALTA) 20 MG capsule  08/10/18 05/18/19  [provider]  gabapentin (NEURONTIN) 400 MG capsule Take 1 capsule (400 mg total) by mouth 2 (two) times daily. 12/06/17 07/27/18  Earnestine Leys, MD  montelukast (SINGULAIR) 10 MG tablet Take 10 mg by mouth as needed.   07/27/18  [provider]    Allergies Bc powder [aspirin-salicylamide-caffeine], Gabapentin, Hydrocodone-acetaminophen, Latex, Penicillin g, Penicillins, and  Shrimp [shellfish allergy]  Family History  Problem Relation Age of Onset   Diabetes Mother    Hypertension Mother    Heart failure Mother    Cancer Mother    Diabetes Father    Hypertension Father     Social History Social History   Tobacco Use   Smoking status: Never   Smokeless tobacco: Never  Vaping Use   Vaping Use: Never used  Substance Use Topics   Alcohol use: No   Drug use: No     Review of Systems  Constitutional: No fever/chills Eyes: No visual changes. No discharge ENT: No upper respiratory complaints. Cardiovascular: no chest pain. Respiratory: no cough. No SOB. Gastrointestinal: No abdominal pain.  No nausea, no vomiting.  No diarrhea.  No constipation. Genitourinary: Positive for dysuria and flank pain.  No hematuria Musculoskeletal: Negative for musculoskeletal pain. Skin: Negative for rash, abrasions, lacerations, ecchymosis. Neurological: Negative for headaches, focal weakness or numbness.  10 System ROS otherwise negative.  ____________________________________________   PHYSICAL EXAM:  VITAL SIGNS: ED Triage Vitals  Enc Vitals Group     BP 08/12/20  1553 129/62     Pulse Rate 08/12/20 1553 61     Resp 08/12/20 1553 17     Temp 08/12/20 1553 98.6 F (37 C)     Temp Source 08/12/20 1553 Oral     SpO2 08/12/20 1553 98 %     Weight 08/12/20 1721 244 lb 14.9 oz (111.1 kg)     Height 08/12/20 1721 5\' 6"  (1.676 m)     Head Circumference --      Peak Flow --      Pain Score 08/12/20 1602 9     Pain Loc --      Pain Edu? --      Excl. in [place]? --      Constitutional: Alert and oriented. Well appearing and in no acute distress. Eyes: Conjunctivae are normal. PERRL. EOMI. Head: Atraumatic. ENT:      Ears:       Nose: No congestion/rhinnorhea.  Maxillary sinuses are tender to percussion.      Mouth/Throat: Mucous membranes are moist.  Neck: No stridor.  Neck is supple full range of motion Hematological/Lymphatic/Immunilogical: No cervical  lymphadenopathy. Cardiovascular: Normal rate, regular rhythm. Normal S1 and S2.  Good peripheral circulation. Respiratory: Normal respiratory effort without tachypnea or retractions. Lungs CTAB. Good air entry to the bases with no decreased or absent breath sounds. Gastrointestinal: Bowel sounds 4 quadrants. Soft and nontender to palpation. No guarding or rigidity. No palpable masses. No distention.  Left-sided CVA tenderness. Musculoskeletal: Full range of motion to all extremities. No gross deformities appreciated. Neurologic:  Normal speech and language. No gross focal neurologic deficits are appreciated.  Skin:  Skin is warm, dry and intact. No rash noted. Psychiatric: Mood and affect are normal. Speech and behavior are normal. Patient exhibits appropriate insight and judgement.   ____________________________________________   LABS (all labs ordered are listed, but only abnormal results are displayed)  Labs Reviewed  URINALYSIS, COMPLETE (UACMP) WITH MICROSCOPIC - Abnormal; Notable for the following components:      Result Value   Color, Urine YELLOW (*)    APPearance CLEAR (*)    Hgb urine dipstick SMALL (*)    All other components within normal limits   ____________________________________________  EKG   ____________________________________________  RADIOLOGY I personally viewed and evaluated these images as part of my medical decision making, as well as reviewing the written report by the radiologist.  ED Provider Interpretation: No evidence of nephrolithiasis on CT.  Evidence of diverticulosis without evidence of diverticulitis.  CT Renal Stone Study  Result Date: 08/12/2020 CLINICAL DATA:  Abdominal pain urinary frequency and dysuria EXAM: CT ABDOMEN AND PELVIS WITHOUT CONTRAST TECHNIQUE: Multidetector CT imaging of the abdomen and pelvis was performed following the standard protocol without IV contrast. COMPARISON:  Same day CT abdomen pelvis FINDINGS: Lower chest: No  acute abnormality. Hepatobiliary: Unremarkable noncontrast appearance of the hepatic parenchyma. Gallbladder is unremarkable. No biliary ductal dilation. Pancreas: Within normal limits. Spleen: Within normal limits. Adrenals/Urinary Tract: Bilateral adrenal glands unremarkable. No hydronephrosis. No renal, ureteral or bladder calculi visualized. Urinary bladder is decompressed limiting evaluation. Stomach/Bowel: Small hiatal hernia otherwise the stomach is grossly unremarkable degree of distension. No pathologic dilation of small. Appendix and terminal ileum are normal. Colonic diverticulosis without findings of acute diverticulitis. Vascular/Lymphatic: No abdominal aortic aneurysm. No pathologically enlarged abdominopelvic lymph nodes. Reproductive: Status post hysterectomy. No suspicious adnexal masses. Other: No abdominopelvic ascites. Musculoskeletal: L5-S1 discogenic disease with Modic type endplate changes. IMPRESSION: 1. No acute intra-abdominal findings. Specifically,  no evidence of obstructive uropathy. 2. Colonic diverticulosis without findings of acute diverticulitis. Electronically Signed   By: Dahlia Bailiff MD   On: 08/12/2020 20:53    ____________________________________________    PROCEDURES  Procedure(s) performed:    Procedures    Medications - No data to display   ____________________________________________   INITIAL IMPRESSION / ASSESSMENT AND PLAN / ED COURSE  Pertinent labs & imaging results that were available during my care of the patient were reviewed by me and considered in my medical decision making (see chart for details).  Review of the [place] CSRS was performed in accordance of the [place] prior to dispensing any controlled drugs.           Patient's diagnosis is consistent with sinusitis, cystitis.  Patient presented to the ED with 2 complaints.  Patient was complaining of some dysuria and frequency as well as some flank pain.  She was also complaining of  sinus congestion and sinus pressure.  Findings on exam are consistent with sinusitis.  Given the hematuria on urinalysis and pain concern for nephrolithiasis existed as patient does have a history of same.  Patient had imaging and then eloped from the ED without informing myself or staff.  Patient was not given her diagnosis of findings on CT nor has she received medication for her sinusitis.  Again patient eloped but I had seen the patient prior to her elopement.     ____________________________________________  FINAL CLINICAL IMPRESSION(S) / ED DIAGNOSES  Final diagnoses:  Interstitial cystitis  Acute maxillary sinusitis, recurrence not specified      NEW MEDICATIONS STARTED DURING THIS VISIT:  ED Discharge Orders     None           This chart was dictated using voice recognition software/Dragon. Despite best efforts to proofread, errors can occur which can change the meaning. Any change was purely unintentional.    Darletta Moll, PA-C 08/12/20 2223    Naaman Plummer, MD 08/12/20 [phone]

## 2020-08-12 NOTE — ED Notes (Signed)
See triage note  Presents with nasal congestion which started about 3 days ago  No fever  States she gets tested for COVID weekly at work was negative today  Also having some urinary freq and dysuria

## 2020-08-12 NOTE — ED Notes (Signed)
UA collected during pt's triage.

## 2020-08-12 NOTE — ED Triage Notes (Signed)
Pt reports urinary frequency for the past 3 days and sinus congestion, reports decreased energy. Last covid test at work today was negative and states her blood sugar have been goode, 120 this am

## 2020-09-07 ENCOUNTER — Other Ambulatory Visit: Payer: Self-pay

## 2020-09-07 ENCOUNTER — Ambulatory Visit
Admission: EM | Admit: 2020-09-07 | Discharge: 2020-09-07 | Discharge status: Against Medical Advice | Payer: Medicare Other

## 2020-09-07 ENCOUNTER — Ambulatory Visit
Admission: EM | Admit: 2020-09-07 | Discharge: 2020-09-07 | Disposition: A | Discharge status: Home / Self Care | Payer: Medicare Other | Attending: Sports Medicine | Admitting: Sports Medicine

## 2020-09-07 DIAGNOSIS — H6502 Acute serous otitis media, left ear: Secondary | ICD-10-CM

## 2020-09-07 DIAGNOSIS — H9202 Otalgia, left ear: Secondary | ICD-10-CM | POA: Diagnosis not present

## 2020-09-07 DIAGNOSIS — R590 Localized enlarged lymph nodes: Secondary | ICD-10-CM | POA: Diagnosis not present

## 2020-09-07 DIAGNOSIS — J029 Acute pharyngitis, unspecified: Secondary | ICD-10-CM

## 2020-09-07 DIAGNOSIS — M542 Cervicalgia: Secondary | ICD-10-CM

## 2020-09-07 MED ORDER — AZITHROMYCIN 250 MG PO TABS: 250.0000 mg | ORAL_TABLET | Freq: Every day | ORAL | 0 refills | Status: DC

## 2020-09-07 NOTE — ED Triage Notes (Signed)
Pt states she has had 2 covid test this week at work, they are negative.

## 2020-09-07 NOTE — ED Triage Notes (Signed)
Pt here with C/O with SOB and sore throat. Was taken off inhaler and put on nebulizer 1 month ago. Pt states since then she has been very tired.

## 2020-09-07 NOTE — Discharge Instructions (Addendum)
As we discussed, you do have a left ear infection that can be contributing to your left neck pain and the painful node that she has in the left side of her neck.  The remainder of your examination is within normal limits.  I will send in a Z-Pak as you have a penicillin allergy.  You have told me that you have a little bit of a sore throat I am recommending salt water gargles and throat lozenges. Also provided you some educational handouts. Contact your primary care provider on Monday if you are still symptomatic to be seen. If your symptoms worsen then you can go to the ER. Motrin Advil ibuprofen for any discomfort.  You are on a pain contract so no narcotics were prescribed.

## 2020-09-07 NOTE — ED Triage Notes (Signed)
Pt was called and stated she left. I inform pt that she would need to reregister again if she wanted to be seen.

## 2020-09-07 NOTE — ED Provider Notes (Signed)
[facility]    CSN: [id number] Arrival date & time: 09/07/20  1136      History   Chief Complaint Chief Complaint  Patient presents with   Shortness of Breath   Sore Throat    HPI Vanessa Fisher is a 60 y.o. female.   60 year old female who presents for evaluation of essentially left ear pain, left-sided throat pain, and some tenderness in the left side of her neck.  She works over in an independent living center.  She had 2 negative COVID tests at work this week.  Normally sees Dr. Brunetta Genera for her primary care needs.  She said that he has recently retired and she is waiting for another provider.  There is a question of her having some shortness of breath.  She was put on a nebulizer month ago by Dr. Brunetta Genera because she had Lambert.  She was taken off her inhaler and put on the nebulizer.  She has not really needed it much.  She is satting 100% on room air without any wheeze.  She denies any fever shakes chills.  No nausea vomiting or diarrhea.  She reports that she has a lot of fatigue.  She plans on addressing that with her primary care physician that is chronic in nature.  No chest pain or shortness of breath on my independent review of systems.  No red flag signs or symptoms elicited on history.     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/06/2017   Radial styloid tenosynovitis 08/11/2017   Lumbar facet osteoarthritis 11/10/2016    Chronic musculoskeletal pain 11/05/2016   Lumbar radiculitis (Right) (L4) 09/28/2016   Vitamin D insufficiency 07/30/2016   Chronic pain syndrome 07/08/2016   Long term (current) use of opiate analgesic 07/08/2016   Long term prescription opiate use 07/08/2016   Opiate use (30 MME/day) 07/08/2016   Chronic low back pain (Primary Source of Pain) (midline) ( (Bilateral) (L>R) 07/08/2016   Disturbance of skin sensation 07/08/2016   Chronic neck pain (Secondary source of pain) (midline) (Bilateral) (L>R) 07/08/2016   DDD (degenerative disc disease), cervical 07/08/2016   DDD (degenerative disc disease), lumbar 07/08/2016   Cervical spondylosis 07/08/2016   Cervical radicular pain (Right) (C7/C8) 07/08/2016   Upper extremity numbness (Right) 07/08/2016   Cervical facet syndrome (Bilateral) (L>R) 07/08/2016   Osteoarthritis of knee (Bilateral) (R>L) 07/08/2016   Chronic lower extremity pain (Right) 07/08/2016   Lumbar facet syndrome (Bilateral) (L>R) 07/08/2016   Grade 1 Anterolisthesis of L4 over L5 (3 mm) 07/08/2016   Grade 1 Retrolisthesis of L5 over S1 (5 mm) 07/08/2016   Chronic knee pain [facility] source of pain) (Bilateral) (R>L) 05/12/2016    Past Surgical History:  Procedure Laterality Date   CARPAL TUNNEL RELEASE Right 12/06/2017  Procedure: CARPAL TUNNEL RELEASE;  Surgeon: Earnestine Leys, MD;  Location: [facility];  Service: Orthopedics;  Laterality: Right;   DORSAL COMPARTMENT RELEASE Right 12/06/2017   Procedure: RELEASE DORSAL COMPARTMENT (DEQUERVAIN);  Surgeon: Earnestine Leys, MD;  Location: [facility];  Service: Orthopedics;  Laterality: Right;   PARTIAL HYSTERECTOMY     TUBAL LIGATION      OB History   No obstetric history on file.      Home Medications    Prior to Admission medications   Medication Sig Start Date End Date Taking? Authorizing Provider  azithromycin (ZITHROMAX) 250 MG tablet Take 1 tablet (250 mg total) by mouth daily. Take first 2 tablets together,  then 1 every day until finished. 09/07/20  Yes Verda Cumins, MD  cetirizine-pseudoephedrine (ZYRTEC-D) 5-120 MG tablet Take 1 tablet by mouth daily. 01/18/20  Yes Rodriguez-Southworth, Sunday Spillers, PA-C  fluconazole (DIFLUCAN) 150 MG tablet Take 1 tablet by mouth every 72 hours as needed for yeast infection 02/06/20  Yes Laurene Footman B, PA-C  fluticasone (FLONASE) 50 MCG/ACT nasal spray Place 2 sprays into both nostrils daily. 05/04/20  Yes Verda Cumins, MD  glimepiride (AMARYL) 2 MG tablet Take 2 mg by mouth daily. 07/03/17  Yes [provider]  ibuprofen (ADVIL) 800 MG tablet Take 1 tablet (800 mg total) by mouth every 8 (eight) hours as needed. 05/23/20  Yes Alfred Levins, [place], MD  lisinopril (ZESTRIL) 10 MG tablet  10/10/18  Yes [provider]  metroNIDAZOLE (METROGEL VAGINAL) 0.75 % vaginal gel One applicator qhs x 5 nights 01/18/20  Yes Rodriguez-Southworth, Sunday Spillers, PA-C  ondansetron (ZOFRAN-ODT) 4 MG disintegrating tablet Take 1 tablet (4 mg total) by mouth every 8 (eight) hours as needed. 05/18/19  Yes Karen Kitchens, NP  oxyCODONE-acetaminophen (PERCOCET) 10-325 MG tablet Take 1 tablet by mouth 3 (three) times daily as needed. 05/12/19  Yes [provider]  pantoprazole (PROTONIX) 40 MG tablet Take 40 mg by mouth daily. 02/21/19  Yes [provider]  phenazopyridine (PYRIDIUM) 200 MG tablet Take 1 tablet (200 mg total) by mouth 3 (three) times daily. 05/04/20  Yes Verda Cumins, MD  predniSONE (DELTASONE) 10 MG tablet Take 5 tablets (50 mg total) by mouth daily. 07/13/20  Yes Triplett, Cari B, FNP  triamcinolone cream (KENALOG) 0.1 % Apply 1 application topically 2 (two) times daily as needed. 08/09/18  Yes Karen Kitchens, NP  albuterol (PROVENTIL HFA;VENTOLIN HFA) 108 (90 BASE) MCG/ACT inhaler Inhale 2 puffs into the lungs every 6 (six) hours as needed for wheezing or shortness of breath.  11/06/18  [provider]  atorvastatin (LIPITOR) 10 MG tablet Take 10 mg by  mouth at bedtime.   11/06/18  [provider]  DULoxetine (CYMBALTA) 20 MG capsule  08/10/18 05/18/19  [provider]  gabapentin (NEURONTIN) 400 MG capsule Take 1 capsule (400 mg total) by mouth 2 (two) times daily. 12/06/17 07/27/18  Earnestine Leys, MD  montelukast (SINGULAIR) 10 MG tablet Take 10 mg by mouth as needed.   07/27/18  [provider]    Family History Family History  Problem Relation Age of Onset   Diabetes Mother    Hypertension Mother    Heart failure Mother    Cancer Mother    Diabetes Father    Hypertension Father     Social History Social History   Tobacco Use   Smoking status: Never   Smokeless tobacco: Never  Vaping Use   Vaping Use: Never used  Substance Use Topics  Alcohol use: No   Drug use: No     Allergies   Bc powder [aspirin-salicylamide-caffeine], Gabapentin, Hydrocodone-acetaminophen, Latex, Penicillin g, Penicillins, and Shrimp [shellfish allergy]   Review of Systems Review of Systems  Constitutional:  Positive for activity change and fatigue. Negative for appetite change, chills, diaphoresis and fever.  HENT:  Positive for ear pain and sore throat. Negative for congestion, ear discharge, postnasal drip, rhinorrhea, sinus pressure, sinus pain and sneezing.   Eyes:  Negative for pain.  Respiratory:  Negative for cough, chest tightness and shortness of breath.   Cardiovascular:  Negative for chest pain and palpitations.  Gastrointestinal:  Negative for abdominal pain, diarrhea, nausea and vomiting.  Genitourinary:  Negative for dysuria.  Musculoskeletal:  Positive for neck pain. Negative for back pain, myalgias and neck stiffness.  Skin:  Negative for color change, pallor, rash and wound.  Neurological:  Negative for dizziness, syncope, light-headedness, numbness and headaches.  Hematological:  Positive for adenopathy.  All other systems reviewed and are negative.   Physical Exam Triage Vital Signs ED Triage  Vitals  Enc Vitals Group     BP 09/07/20 1229 (!) 137/54     Pulse Rate 09/07/20 1229 69     Resp 09/07/20 1229 18     Temp 09/07/20 1229 98.4 F (36.9 C)     Temp Source 09/07/20 1229 Oral     SpO2 09/07/20 1229 100 %     Weight 09/07/20 1225 240 lb (108.9 kg)     Height 09/07/20 1225 '5\' 6"'$  (1.676 m)     Head Circumference --      Peak Flow --      Pain Score 09/07/20 1225 10     Pain Loc --      Pain Edu? --      Excl. in [place]? --    No data found.  Updated Vital Signs BP (!) 137/54 (BP Location: Right Arm)   Pulse 69   Temp 98.4 F (36.9 C) (Oral)   Resp 18   Ht '5\' 6"'$  (1.676 m)   Wt 108.9 kg   SpO2 100%   BMI 38.74 kg/m   Visual Acuity Right Eye Distance:   Left Eye Distance:   Bilateral Distance:    Right Eye Near:   Left Eye Near:    Bilateral Near:     Physical Exam Vitals and nursing note reviewed.  Constitutional:      General: She is not in acute distress.    Appearance: Normal appearance. She is well-developed. She is not ill-appearing.  HENT:     Head: Normocephalic and atraumatic.     Right Ear: Tympanic membrane normal.     Left Ear: Tenderness present. Tympanic membrane is erythematous.     Nose: Nose normal. No congestion or rhinorrhea.     Mouth/Throat:     Mouth: Mucous membranes are moist. No oral lesions.     Pharynx: Uvula midline. Posterior oropharyngeal erythema present. No pharyngeal swelling, oropharyngeal exudate or uvula swelling.     Tonsils: No tonsillar exudate or tonsillar abscesses. 0 on the right. 0 on the left.  Eyes:     Extraocular Movements:     Right eye: Normal extraocular motion.     Left eye: Normal extraocular motion.     Conjunctiva/sclera: Conjunctivae normal.     Pupils: Pupils are equal, round, and reactive to light.  Neck:     Thyroid: No thyromegaly.  Cardiovascular:     Rate and Rhythm:  Normal rate and regular rhythm.     Pulses: Normal pulses.     Heart sounds: Normal heart sounds. No murmur heard.   No  friction rub. No gallop.  Pulmonary:     Effort: Pulmonary effort is normal.     Breath sounds: Normal breath sounds. No stridor. No wheezing, rhonchi or rales.  Musculoskeletal:     Cervical back: Normal range of motion and neck supple.  Lymphadenopathy:     Cervical: Cervical adenopathy present.  Skin:    General: Skin is warm and dry.     Capillary Refill: Capillary refill takes less than 2 seconds.     Findings: No erythema or rash.  Neurological:     General: No focal deficit present.     Mental Status: She is alert and oriented to person, place, and time.     UC Treatments / Results  Labs (all labs ordered are listed, but only abnormal results are displayed) Labs Reviewed - No data to display  EKG   Radiology No results found.  Procedures Procedures (including critical care time)  Medications Ordered in UC Medications - No data to display  Initial Impression / Assessment and Plan / UC Course  I have reviewed the triage vital signs and the nursing notes.  Pertinent labs & imaging results that were available during my care of the patient were reviewed by me and considered in my medical decision making (see chart for details).  Clinical impression: 1.  Left ear pain 2.  Left otitis media 3.  Left-sided anterior cervical lymphadenopathy 4.  Neck pain on the left side 5.  Sore throat with a reassuring examination.  Treatment plan: 1.  The findings and treatment plan were discussed in detail with the patient.  Patient was in agreement. 2.  We will go ahead and treat her with antibiotic for her otitis media.  She has a penicillin allergy so sent a Z-Pak. 3.  Educational handouts provided. 4.  Explained to her that her painful node on the left side, her ear pain, and left-sided sore throat are all related to the infection in her ear.  The antibiotic should help all of that. 5.  Plenty of rest, plenty of fluids, Tylenol or Motrin for any fever or discomfort.  Also  recommended over-the-counter throat lozenges and salt water gargles for throat.  Hot teas as well. 6.  I asked her to contact her primary care provider on Monday if she still symptomatic. 7.  If she worsens in any way she needed to go to the ER. 8.  She is on a narcotic contract so just over-the-counter meds as needed. 9.  At the end of the encounter she was asking for a work note.  Her next scheduled shift is until Tuesday, I did take her out through Tuesday but she can return on Wednesday in which would be August 10. 10.  She was discharged in stable condition and will follow-up here as needed.    Final Clinical Impressions(s) / UC Diagnoses   Final diagnoses:  Left ear pain  Anterior cervical lymphadenopathy  Neck pain on left side  Non-recurrent acute serous otitis media of left ear  Sore throat     Discharge Instructions      As we discussed, you do have a left ear infection that can be contributing to your left neck pain and the painful node that she has in the left side of her neck.  The remainder of your examination is  within normal limits.  I will send in a Z-Pak as you have a penicillin allergy.  You have told me that you have a little bit of a sore throat I am recommending salt water gargles and throat lozenges. Also provided you some educational handouts. Contact your primary care provider on Monday if you are still symptomatic to be seen. If your symptoms worsen then you can go to the ER. Motrin Advil ibuprofen for any discomfort.  You are on a pain contract so no narcotics were prescribed.     ED Prescriptions     Medication Sig Dispense Auth. Provider   azithromycin (ZITHROMAX) 250 MG tablet Take 1 tablet (250 mg total) by mouth daily. Take first 2 tablets together, then 1 every day until finished. 6 tablet Verda Cumins, MD      PDMP not reviewed this encounter.   Verda Cumins, MD 09/07/20 1345

## 2020-10-06 ENCOUNTER — Other Ambulatory Visit: Payer: Self-pay

## 2020-10-06 ENCOUNTER — Ambulatory Visit
Admission: EM | Admit: 2020-10-06 | Discharge: 2020-10-06 | Disposition: A | Discharge status: Home / Self Care | Payer: Medicare Other | Attending: Physician Assistant | Admitting: Physician Assistant

## 2020-10-06 DIAGNOSIS — Z886 Allergy status to analgesic agent status: Secondary | ICD-10-CM | POA: Diagnosis not present

## 2020-10-06 DIAGNOSIS — J45909 Unspecified asthma, uncomplicated: Secondary | ICD-10-CM | POA: Insufficient documentation

## 2020-10-06 DIAGNOSIS — R059 Cough, unspecified: Secondary | ICD-10-CM

## 2020-10-06 DIAGNOSIS — J029 Acute pharyngitis, unspecified: Secondary | ICD-10-CM | POA: Insufficient documentation

## 2020-10-06 DIAGNOSIS — Z20822 Contact with and (suspected) exposure to covid-19: Secondary | ICD-10-CM | POA: Diagnosis not present

## 2020-10-06 DIAGNOSIS — Z880 Allergy status to penicillin: Secondary | ICD-10-CM | POA: Diagnosis not present

## 2020-10-06 DIAGNOSIS — J111 Influenza due to unidentified influenza virus with other respiratory manifestations: Secondary | ICD-10-CM | POA: Diagnosis not present

## 2020-10-06 DIAGNOSIS — Z8616 Personal history of COVID-19: Secondary | ICD-10-CM | POA: Diagnosis not present

## 2020-10-06 DIAGNOSIS — R062 Wheezing: Secondary | ICD-10-CM | POA: Diagnosis not present

## 2020-10-06 DIAGNOSIS — N3000 Acute cystitis without hematuria: Secondary | ICD-10-CM | POA: Diagnosis not present

## 2020-10-06 LAB — POCT URINALYSIS DIP (DEVICE)
Bilirubin Urine: NEGATIVE
Glucose, UA: NEGATIVE mg/dL
Ketones, ur: NEGATIVE mg/dL
Nitrite: NEGATIVE
Protein, ur: NEGATIVE mg/dL
Specific Gravity, Urine: 1.02 (ref 1.005–1.030)
Urobilinogen, UA: 1 mg/dL (ref 0.0–1.0)
pH: 6 (ref 5.0–8.0)

## 2020-10-06 MED ORDER — LEVOFLOXACIN 500 MG PO TABS: 500.0000 mg | ORAL_TABLET | Freq: Every day | ORAL | 0 refills | Status: DC

## 2020-10-06 MED ORDER — ALBUTEROL SULFATE HFA 108 (90 BASE) MCG/ACT IN AERS: 2.0000 | INHALATION_SPRAY | Freq: Four times a day (QID) | RESPIRATORY_TRACT | 0 refills | Status: DC | PRN

## 2020-10-06 MED ORDER — FLUCONAZOLE 150 MG PO TABS: ORAL_TABLET | ORAL | 0 refills | Status: DC

## 2020-10-06 MED ORDER — ALBUTEROL SULFATE HFA 108 (90 BASE) MCG/ACT IN AERS: 2.0000 | INHALATION_SPRAY | Freq: Four times a day (QID) | RESPIRATORY_TRACT | 0 refills | Status: AC | PRN

## 2020-10-06 NOTE — Discharge Instructions (Addendum)
If your Covid test ends up positive you may take the following supplements to help your immune system be stronger to fight this viral infection Take Quarcetin 500 mg three times a day x 7 days with Zinc 50 mg ones a day x 7 days. The quarcetin is an antiviral and anti-inflammatory supplement which helps open the zinc channels in the cell to absorb Zinc. Zinc helps decrease the virus load in your body. Take Melatonin 6-10 mg at bed time which also helps support your immune system.  Also make sure to take Vit D 5,000 IU per day with a fatty meal and Vit C 5000 mg a day until you are completely better. To prevent viral illnesses your vitamin D should be between 60-80. Don't lay around, keep active and walk as much as you are able to to prevent worsening of your symptoms.  Follow up with your family Dr next week.  If you get short of breath and you are able to check  your oxygen with a pulse oxygen meter, if it gets to 92% or less, you need to go to the hospital to be admitted. If you dont have one, come back here and we will assess you.

## 2020-10-06 NOTE — ED Provider Notes (Signed)
[facility]    CSN: [id number] Arrival date & time: 10/06/20  1256      History   Chief Complaint Chief Complaint  Patient presents with   Cough   Dysuria   Sore Throat   Generalized Body Aches    HPI Vanessa Fisher is a 60 y.o. female who presents due to: 1- cough and ST chills x 3 days. Had bronchitis and UTI 8/11 and her cough completely resolved til now. Gets frequent URI's. She is out of her Ventolin inhaler. Denies past hx of smoking. Works with elderly. Has hx of vitamin D and used to be on prescription vit D, but since she does not have a PCP has not been taking any. She is prone to getting secondary infections with URI's since she has asthma.  Has had covid infection twice  2- Onset of dysuria x 3 days. Denies abdominal pain, but her L back hurts. She took one Amoxicillin pill she had left over yesterday.     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/06/2017   Radial styloid tenosynovitis 08/11/2017   Lumbar facet osteoarthritis 11/10/2016   Chronic musculoskeletal pain 11/05/2016   Lumbar radiculitis (Right) (L4) 09/28/2016   Vitamin D insufficiency 07/30/2016   Chronic pain syndrome 07/08/2016   Long term (current) use of opiate analgesic 07/08/2016   Long term prescription opiate use 07/08/2016   Opiate use (30 MME/day) 07/08/2016   Chronic low back pain (Primary Source of Pain) (midline) ( (Bilateral) (L>R)  07/08/2016   Disturbance of skin sensation 07/08/2016   Chronic neck pain (Secondary source of pain) (midline) (Bilateral) (L>R) 07/08/2016   DDD (degenerative disc disease), cervical 07/08/2016   DDD (degenerative disc disease), lumbar 07/08/2016   Cervical spondylosis 07/08/2016   Cervical radicular pain (Right) (C7/C8) 07/08/2016   Upper extremity numbness (Right) 07/08/2016   Cervical facet syndrome (Bilateral) (L>R) 07/08/2016   Osteoarthritis of knee (Bilateral) (R>L) 07/08/2016   Chronic lower extremity pain (Right) 07/08/2016   Lumbar facet syndrome (Bilateral) (L>R) 07/08/2016   Grade 1 Anterolisthesis of L4 over L5 (3 mm) 07/08/2016   Grade 1 Retrolisthesis of L5 over S1 (5 mm) 07/08/2016   Chronic knee pain (Tertiary source of pain) (Bilateral) (R>L) 05/12/2016    Past Surgical History:  Procedure Laterality Date   CARPAL TUNNEL RELEASE Right 12/06/2017   Procedure: CARPAL TUNNEL RELEASE;  Surgeon: Earnestine Leys, MD;  Location: [facility];  Service: Orthopedics;  Laterality: Right;   DORSAL COMPARTMENT RELEASE Right 12/06/2017   Procedure: RELEASE DORSAL COMPARTMENT (DEQUERVAIN);  Surgeon: Earnestine Leys, MD;  Location: [facility];  Service: Orthopedics;  Laterality: Right;   PARTIAL HYSTERECTOMY     TUBAL LIGATION      OB History   No obstetric history on file.  Home Medications    Prior to Admission medications   Medication Sig Start Date End Date Taking? Authorizing Provider  albuterol (VENTOLIN HFA) 108 (90 Base) MCG/ACT inhaler Inhale 2 puffs into the lungs every 6 (six) hours as needed for wheezing or shortness of breath. 10/06/20   Rodriguez-Southworth, Sunday Spillers, PA-C  cetirizine-pseudoephedrine (ZYRTEC-D) 5-120 MG tablet Take 1 tablet by mouth daily. 01/18/20   Rodriguez-Southworth, Sunday Spillers, PA-C  fluconazole (DIFLUCAN) 150 MG tablet Take 1 tablet by mouth every 72 hours as needed for yeast infection 10/06/20   Rodriguez-Southworth, Sunday Spillers, PA-C  fluticasone  (FLONASE) 50 MCG/ACT nasal spray Place 2 sprays into both nostrils daily. 05/04/20   Verda Cumins, MD  glimepiride (AMARYL) 2 MG tablet Take 2 mg by mouth daily. 07/03/17   [provider]  ibuprofen (ADVIL) 800 MG tablet Take 1 tablet (800 mg total) by mouth every 8 (eight) hours as needed. 05/23/20   Rudene Re, MD  levofloxacin (LEVAQUIN) 500 MG tablet Take 1 tablet (500 mg total) by mouth daily. 10/06/20   Rodriguez-Southworth, Sunday Spillers, PA-C  lisinopril (ZESTRIL) 10 MG tablet  10/10/18   [provider]  metroNIDAZOLE (METROGEL VAGINAL) 0.75 % vaginal gel One applicator qhs x 5 nights 01/18/20   Rodriguez-Southworth, Sunday Spillers, PA-C  ondansetron (ZOFRAN-ODT) 4 MG disintegrating tablet Take 1 tablet (4 mg total) by mouth every 8 (eight) hours as needed. 05/18/19   Karen Kitchens, NP  oxyCODONE-acetaminophen (PERCOCET) 10-325 MG tablet Take 1 tablet by mouth 3 (three) times daily as needed. 05/12/19   [provider]  pantoprazole (PROTONIX) 40 MG tablet Take 40 mg by mouth daily. 02/21/19   [provider]  phenazopyridine (PYRIDIUM) 200 MG tablet Take 1 tablet (200 mg total) by mouth 3 (three) times daily. 05/04/20   Verda Cumins, MD  predniSONE (DELTASONE) 10 MG tablet Take 5 tablets (50 mg total) by mouth daily. 07/13/20   Triplett, Johnette Abraham B, FNP  triamcinolone cream (KENALOG) 0.1 % Apply 1 application topically 2 (two) times daily as needed. 08/09/18   Karen Kitchens, NP  atorvastatin (LIPITOR) 10 MG tablet Take 10 mg by mouth at bedtime.   11/06/18  [provider]  DULoxetine (CYMBALTA) 20 MG capsule  08/10/18 05/18/19  [provider]  gabapentin (NEURONTIN) 400 MG capsule Take 1 capsule (400 mg total) by mouth 2 (two) times daily. 12/06/17 07/27/18  Earnestine Leys, MD  montelukast (SINGULAIR) 10 MG tablet Take 10 mg by mouth as needed.   07/27/18  [provider]    Family History Family History  Problem Relation Age of Onset   Diabetes  Mother    Hypertension Mother    Heart failure Mother    Cancer Mother    Diabetes Father    Hypertension Father     Social History Social History   Tobacco Use   Smoking status: Never   Smokeless tobacco: Never  Vaping Use   Vaping Use: Never used  Substance Use Topics   Alcohol use: No   Drug use: No     Allergies   Bc powder [aspirin-salicylamide-caffeine], Gabapentin, Hydrocodone-acetaminophen, Latex, Penicillin g, Penicillins, and Shrimp [shellfish allergy]   Review of Systems Review of Systems  Constitutional:  Positive for chills, fatigue and fever. Negative for activity change and appetite change.  HENT:  Positive for congestion and rhinorrhea. Negative for ear discharge, ear pain, sore throat and trouble swallowing.   Eyes:  Negative for discharge.  Respiratory:  Positive for cough and wheezing. Negative for chest  tightness and shortness of breath.   Cardiovascular:  Negative for chest pain.  Genitourinary:  Positive for dysuria and frequency. Negative for flank pain.  Musculoskeletal:  Positive for back pain.  Skin:  Negative for rash.    Physical Exam Triage Vital Signs ED Triage Vitals  Enc Vitals Group     BP 10/06/20 1325 (!) 164/76     Pulse Rate 10/06/20 1325 83     Resp 10/06/20 1325 18     Temp 10/06/20 1325 100 F (37.8 C)     Temp Source 10/06/20 1325 Oral     SpO2 10/06/20 1325 98 %     Weight 10/06/20 1324 240 lb (108.9 kg)     Height 10/06/20 1324 '5\' 6"'$  (1.676 m)     Head Circumference --      Peak Flow --      Pain Score 10/06/20 1322 10     Pain Loc --      Pain Edu? --      Excl. in [place]? --    No data found.  Updated Vital Signs BP (!) 164/76 (BP Location: Right Arm)   Pulse 83   Temp 100 F (37.8 C) (Oral)   Resp 18   Ht '5\' 6"'$  (1.676 m)   Wt 240 lb (108.9 kg)   SpO2 98%   BMI 38.74 kg/m   Visual Acuity Right Eye Distance:   Left Eye Distance:   Bilateral Distance:    Right Eye Near:   Left Eye Near:    Bilateral  Near:     Physical Exam Physical Exam Vitals and nursing note reviewed.  Constitutional:      General: She is not in acute distress.    Appearance: She is not toxic-appearing.  HENT:     Head: Normocephalic.     Right Ear: External ear normal.     Left Ear: External ear normal.  Nose with clear rhinitis Pharynx- clear NECK- supple with tender L upper chain node Eyes:     General: No scleral icterus.    Conjunctiva/sclera: Conjunctivae normal.  Pulmonary:     Effort: Pulmonary effort is normal. Has mild wheezing Abdominal:     General: Bowel sounds are normal.     Palpations: Abdomen is soft. There is no mass.     Tenderness: There is no guarding or rebound.     Comments: - CVA tenderness   Musculoskeletal:        General: Normal range of motion.     Cervical back: Neck supple.     Comments: BACK- + muscular tenderness on area of complaint with palpation  Skin:    General: Skin is warm and dry.     Findings: No rash.  Neurological:     Mental Status: She is alert and oriented to person, place, and time.     Gait: Gait normal.  Psychiatric:        Mood and Affect: Mood normal.        Behavior: Behavior normal.        Thought Content: Thought content normal.        Judgment: Judgment normal.    UC Treatments / Results  Labs (all labs ordered are listed, but only abnormal results are displayed) Labs Reviewed  POCT URINALYSIS DIP (DEVICE) - Abnormal; Notable for the following components:      Result Value   Hgb urine dipstick TRACE (*)    Leukocytes,Ua TRACE (*)    All other  components within normal limits  SARS CORONAVIRUS 2 (TAT 6-24 HRS)  URINE CULTURE  POCT URINALYSIS DIPSTICK, ED / UC    EKG   Radiology No results found.  Procedures Procedures (including critical care time)  Medications Ordered in UC Medications - No data to display  Initial Impression / Assessment and Plan / UC Course  I have reviewed the triage vital signs and the nursing  notes.  Pertinent labs results that were available during my care of the patient were reviewed by me and considered in my medical decision making (see chart for details).  Covid suspect. Possibly early UTI and bronchitis I placed her on Levaquin, diflucan and refilled her Ventolin as noted.  Needs to have her PCP check her vit D since low levels are associated with frequent viral infections. See instructions for supportive care.    Final Clinical Impressions(s) / UC Diagnoses   Final diagnoses:  Influenza-like illness  Acute cystitis without hematuria  Cough  Wheezing     Discharge Instructions      If your Covid test ends up positive you may take the following supplements to help your immune system be stronger to fight this viral infection Take Quarcetin 500 mg three times a day x 7 days with Zinc 50 mg ones a day x 7 days. The quarcetin is an antiviral and anti-inflammatory supplement which helps open the zinc channels in the cell to absorb Zinc. Zinc helps decrease the virus load in your body. Take Melatonin 6-10 mg at bed time which also helps support your immune system.  Also make sure to take Vit D 5,000 IU per day with a fatty meal and Vit C 5000 mg a day until you are completely better. To prevent viral illnesses your vitamin D should be between 60-80. Don't lay around, keep active and walk as much as you are able to to prevent worsening of your symptoms.  Follow up with your family Dr next week.  If you get short of breath and you are able to check  your oxygen with a pulse oxygen meter, if it gets to 92% or less, you need to go to the hospital to be admitted. If you dont have one, come back here and we will assess you.       ED Prescriptions     Medication Sig Dispense Auth. Provider   fluconazole (DIFLUCAN) 150 MG tablet  (Status: Discontinued) Take 1 tablet by mouth every 72 hours as needed for yeast infection 2 tablet Rodriguez-Southworth, Sunday Spillers, PA-C   levofloxacin  (LEVAQUIN) 500 MG tablet  (Status: Discontinued) Take 1 tablet (500 mg total) by mouth daily. 5 tablet Rodriguez-Southworth, Sunday Spillers, PA-C   albuterol (VENTOLIN HFA) 108 (90 Base) MCG/ACT inhaler  (Status: Discontinued) Inhale 2 puffs into the lungs every 6 (six) hours as needed for wheezing or shortness of breath. 18 g Rodriguez-Southworth, Yashar Inclan, PA-C   albuterol (VENTOLIN HFA) 108 (90 Base) MCG/ACT inhaler Inhale 2 puffs into the lungs every 6 (six) hours as needed for wheezing or shortness of breath. 18 g Rodriguez-Southworth, Sunday Spillers, PA-C   fluconazole (DIFLUCAN) 150 MG tablet Take 1 tablet by mouth every 72 hours as needed for yeast infection 2 tablet Rodriguez-Southworth, Sunday Spillers, PA-C   levofloxacin (LEVAQUIN) 500 MG tablet Take 1 tablet (500 mg total) by mouth daily. 5 tablet Rodriguez-Southworth, Sunday Spillers, PA-C      PDMP not reviewed this encounter.   Shelby Mattocks, PA-C 10/06/20 1420

## 2020-10-06 NOTE — ED Triage Notes (Signed)
Pt here with C/O cough, ST, pain with urination for 3 days, chills.

## 2020-10-07 LAB — URINE CULTURE: Culture: <10000 — AB

## 2020-10-07 LAB — SARS CORONAVIRUS 2 (TAT 6-24 HRS): SARS Coronavirus 2: NEGATIVE

## 2020-10-09 ENCOUNTER — Other Ambulatory Visit: Payer: Self-pay

## 2020-10-09 ENCOUNTER — Emergency Department: Payer: Medicare Other

## 2020-10-09 ENCOUNTER — Emergency Department
Admission: EM | Admit: 2020-10-09 | Discharge: 2020-10-09 | Disposition: A | Discharge status: Home / Self Care | Payer: Medicare Other | Attending: Emergency Medicine | Admitting: Emergency Medicine

## 2020-10-09 ENCOUNTER — Encounter: Payer: Self-pay | Admitting: Emergency Medicine

## 2020-10-09 DIAGNOSIS — Z20822 Contact with and (suspected) exposure to covid-19: Secondary | ICD-10-CM | POA: Diagnosis not present

## 2020-10-09 DIAGNOSIS — J45909 Unspecified asthma, uncomplicated: Secondary | ICD-10-CM | POA: Insufficient documentation

## 2020-10-09 DIAGNOSIS — J209 Acute bronchitis, unspecified: Secondary | ICD-10-CM | POA: Insufficient documentation

## 2020-10-09 DIAGNOSIS — Z7952 Long term (current) use of systemic steroids: Secondary | ICD-10-CM | POA: Insufficient documentation

## 2020-10-09 DIAGNOSIS — Z91040 Latex allergy status: Secondary | ICD-10-CM | POA: Diagnosis not present

## 2020-10-09 DIAGNOSIS — Z79899 Other long term (current) drug therapy: Secondary | ICD-10-CM | POA: Insufficient documentation

## 2020-10-09 DIAGNOSIS — E119 Type 2 diabetes mellitus without complications: Secondary | ICD-10-CM | POA: Diagnosis not present

## 2020-10-09 DIAGNOSIS — J029 Acute pharyngitis, unspecified: Secondary | ICD-10-CM | POA: Diagnosis present

## 2020-10-09 LAB — RESP PANEL BY RT-PCR (FLU A&B, COVID) ARPGX2
Influenza A by PCR: NEGATIVE
Influenza B by PCR: NEGATIVE
SARS Coronavirus 2 by RT PCR: NEGATIVE

## 2020-10-09 MED ORDER — AZITHROMYCIN 250 MG PO TABS: ORAL_TABLET | ORAL | 0 refills | Status: DC

## 2020-10-09 MED ORDER — PREDNISONE 10 MG (21) PO TBPK: ORAL_TABLET | ORAL | 0 refills | Status: DC

## 2020-10-09 MED ORDER — DEXAMETHASONE SODIUM PHOSPHATE 10 MG/ML IJ SOLN
10.0000 mg | Freq: Once | INTRAMUSCULAR | Status: AC
  Administered 2020-10-09: 10 mg via INTRAMUSCULAR
  Filled 2020-10-09: qty 1

## 2020-10-09 MED ORDER — IPRATROPIUM-ALBUTEROL 0.5-2.5 (3) MG/3ML IN SOLN
3.0000 mL | Freq: Once | RESPIRATORY_TRACT | Status: AC
  Administered 2020-10-09: 3 mL via RESPIRATORY_TRACT
  Filled 2020-10-09: qty 3

## 2020-10-09 MED ORDER — ALBUTEROL SULFATE (2.5 MG/3ML) 0.083% IN NEBU: 2.5000 mg | INHALATION_SOLUTION | RESPIRATORY_TRACT | 2 refills | Status: AC | PRN

## 2020-10-09 NOTE — ED Triage Notes (Signed)
C/O sore throat and sinus congestion x 4 days.  Denies fevers.

## 2020-10-09 NOTE — ED Provider Notes (Signed)
[facility] Emergency Department Provider Note  ____________________________________________   Event Date/Time   First MD Initiated Contact with Patient 10/09/20 1302     (approximate)  I have reviewed the triage vital signs and the nursing notes.   HISTORY  Chief Complaint Sore Throat    HPI Vanessa Fisher is a 60 y.o. female presents emergency department with cough and difficulty breathing.  Patient states she has asthma and has used her inhaler without relief.  She had a negative COVID test 2 days ago.  Continues to have a harsh cough and cannot rest.  Patient denies swelling in her extremities.  No chest pain.  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/06/2017   Radial styloid tenosynovitis 08/11/2017   Lumbar facet osteoarthritis 11/10/2016   Chronic musculoskeletal pain 11/05/2016   Lumbar radiculitis (Right) (L4) 09/28/2016   Vitamin D insufficiency 07/30/2016   Chronic pain syndrome 07/08/2016   Long term (current) use of opiate analgesic 07/08/2016   Long term prescription opiate use 07/08/2016   Opiate use (30 MME/day) 07/08/2016   Chronic low back pain (Primary Source of Pain) (midline) ( (Bilateral) (L>R) 07/08/2016   Disturbance of skin sensation 07/08/2016   Chronic neck pain (Secondary source of pain) (midline) (Bilateral) (L>R) 07/08/2016   DDD (degenerative disc disease), cervical 07/08/2016   DDD  (degenerative disc disease), lumbar 07/08/2016   Cervical spondylosis 07/08/2016   Cervical radicular pain (Right) (C7/C8) 07/08/2016   Upper extremity numbness (Right) 07/08/2016   Cervical facet syndrome (Bilateral) (L>R) 07/08/2016   Osteoarthritis of knee (Bilateral) (R>L) 07/08/2016   Chronic lower extremity pain (Right) 07/08/2016   Lumbar facet syndrome (Bilateral) (L>R) 07/08/2016   Grade 1 Anterolisthesis of L4 over L5 (3 mm) 07/08/2016   Grade 1 Retrolisthesis of L5 over S1 (5 mm) 07/08/2016   Chronic knee pain (Tertiary source of pain) (Bilateral) (R>L) 05/12/2016    Past Surgical History:  Procedure Laterality Date   CARPAL TUNNEL RELEASE Right 12/06/2017   Procedure: CARPAL TUNNEL RELEASE;  Surgeon: Earnestine Leys, MD;  Location: [facility];  Service: Orthopedics;  Laterality: Right;   DORSAL COMPARTMENT RELEASE Right 12/06/2017   Procedure: RELEASE DORSAL COMPARTMENT (DEQUERVAIN);  Surgeon: Earnestine Leys, MD;  Location: [facility] OR[facility];  Service: Orthopedics;  Laterality: Right;   PARTIAL HYSTERECTOMY     TUBAL LIGATION      Prior to Admission medications   Medication Sig Start Date End Date Taking? Authorizing Provider  albuterol (PROVENTIL) (2.5 MG/3ML) 0.083% nebulizer solution Take 3 mLs (2.5 mg total) by nebulization every 4 (four) hours as needed for wheezing or shortness of breath. 10/09/20 10/09/21 Yes Caroly Purewal, Linden Dolin, PA-C  azithromycin (ZITHROMAX Z-PAK) 250 MG tablet 2 pills today then 1 pill a day for 4 days 10/09/20  Yes Morse Brueggemann, Linden Dolin, PA-C  predniSONE (STERAPRED UNI-PAK 21 TAB) 10 MG (21) TBPK tablet Take 6 pills on day one then decrease by 1 pill each day 10/09/20  Yes Yahira Timberman, Linden Dolin, PA-C  albuterol (VENTOLIN HFA) 108 (90 Base) MCG/ACT inhaler Inhale 2 puffs into the lungs every 6 (six) hours as needed for wheezing or shortness of breath. 10/06/20   Rodriguez-Southworth, Sunday Spillers, PA-C  cetirizine-pseudoephedrine (ZYRTEC-D) 5-120 MG tablet Take 1 tablet by mouth daily. 01/18/20    Rodriguez-Southworth, Sunday Spillers, PA-C  fluconazole (DIFLUCAN) 150 MG tablet Take 1 tablet by mouth every 72 hours as needed for yeast infection 10/06/20   Rodriguez-Southworth, Sunday Spillers, PA-C  fluticasone (FLONASE) 50 MCG/ACT nasal spray Place 2 sprays into both nostrils daily. 05/04/20   Verda Cumins, MD  glimepiride (AMARYL) 2 MG tablet Take 2 mg by mouth daily. 07/03/17   [provider]  lisinopril (ZESTRIL) 10 MG tablet  10/10/18   [provider]  oxyCODONE-acetaminophen (PERCOCET) 10-325 MG tablet Take 1 tablet by mouth 3 (three) times daily as needed. 05/12/19   [provider]  pantoprazole (PROTONIX) 40 MG tablet Take 40 mg by mouth daily. 02/21/19   [provider]  phenazopyridine (PYRIDIUM) 200 MG tablet Take 1 tablet (200 mg total) by mouth 3 (three) times daily. 05/04/20   Verda Cumins, MD  triamcinolone cream (KENALOG) 0.1 % Apply 1 application topically 2 (two) times daily as needed. 08/09/18   Karen Kitchens, NP  atorvastatin (LIPITOR) 10 MG tablet Take 10 mg by mouth at bedtime.   11/06/18  [provider]  DULoxetine (CYMBALTA) 20 MG capsule  08/10/18 05/18/19  [provider]  gabapentin (NEURONTIN) 400 MG capsule Take 1 capsule (400 mg total) by mouth 2 (two) times daily. 12/06/17 07/27/18  Earnestine Leys, MD  montelukast (SINGULAIR) 10 MG tablet Take 10 mg by mouth as needed.   07/27/18  [provider]    Allergies Bc powder [aspirin-salicylamide-caffeine], Gabapentin, Hydrocodone-acetaminophen, Latex, Penicillin g, Penicillins, and Shrimp [shellfish allergy]  Family History  Problem Relation Age of Onset   Diabetes Mother    Hypertension Mother    Heart failure Mother    Cancer Mother    Diabetes Father    Hypertension Father     Social History Social History   Tobacco Use   Smoking status: Never   Smokeless tobacco: Never  Vaping Use   Vaping Use: Never used  Substance Use Topics   Alcohol use: No   Drug use:  No    Review of Systems  Constitutional: No fever/chills Eyes: No visual changes. ENT: No sore throat. Respiratory: Positive cough Cardiovascular: Denies chest pain Gastrointestinal: Denies abdominal pain Genitourinary: Negative for dysuria. Musculoskeletal: Negative for back pain. Skin: Negative for rash. Psychiatric: no mood changes,     ____________________________________________   PHYSICAL EXAM:  VITAL SIGNS: ED Triage Vitals  Enc Vitals Group     BP 10/09/20 1139 (!) 151/70     Pulse Rate 10/09/20 1139 60     Resp 10/09/20 1139 18     Temp 10/09/20 1139 98.5 F (36.9 C)     Temp Source 10/09/20 1139 Oral     SpO2 10/09/20 1139 99 %     Weight 10/09/20 1126 240 lb 1.3 oz (108.9 kg)     Height 10/09/20 1126 '5\' 6"'$  (1.676 m)     Head Circumference --      Peak Flow --      Pain Score 10/09/20 1125 6  Pain Loc --      Pain Edu? --      Excl. in [place]? --     Constitutional: Alert and oriented. Well appearing and in no acute distress. Eyes: Conjunctivae are normal.  Head: Atraumatic. Nose: No congestion/rhinnorhea. Mouth/Throat: Mucous membranes are moist.   Neck:  supple no lymphadenopathy noted Cardiovascular: Normal rate, regular rhythm. Heart sounds are normal Respiratory: Normal respiratory effort.  No retractions, lungs diffuse wheezing bilaterally GU: deferred Musculoskeletal: FROM all extremities, warm and well perfused Neurologic:  Normal speech and language.  Skin:  Skin is warm, dry and intact. No rash noted. Psychiatric: Mood and affect are normal. Speech and behavior are normal.  ____________________________________________   LABS (all labs ordered are listed, but only abnormal results are displayed)  Labs Reviewed  RESP PANEL BY RT-PCR (FLU A&B, COVID) [id number]   ____________________________________________   ____________________________________________  RADIOLOGY  Chest  x-ray  ____________________________________________   PROCEDURES  Procedure(s) performed: DuoNeb x2, Decadron  Procedures    ____________________________________________   INITIAL IMPRESSION / ASSESSMENT AND PLAN / ED COURSE  Pertinent labs & imaging results that were available during my care of the patient were reviewed by me and considered in my medical decision making (see chart for details).   Patient is 60 year old female presents emergency department with difficulty breathing.  See HPI.  Physical exam shows patient have diffuse wheezing noted bilaterally  Covid test, chest x-ray DuoNeb  Chest x-ray reviewed by me confirmed by radiology to be negative for pneumonia.  First DuoNeb did give the patient a small amount of relief but she is still wheezing.  Will order second DuoNeb  Patient feels better after the Decadron and second DuoNeb.  Wheezing has decreased and she has better air movement.  Patient be discharged with a prescription for Sterapred, albuterol Nebules, and a Z-Pak.  She is to follow-up with her regular doctor if not improving in 3 days.  Return emergency department for worsening.  She is in agreement with treatment plan and was discharged stable condition.  ERMON SARVIS was evaluated in Emergency Department on 10/09/2020 for the symptoms described in the history of present illness. She was evaluated in the context of the global COVID-19 pandemic, which necessitated consideration that the patient might be at risk for infection with the SARS-CoV-2 virus that causes COVID-19. Institutional protocols and algorithms that pertain to the evaluation of patients at risk for COVID-19 are in a state of rapid change based on information released by regulatory bodies including the CDC and federal and state organizations. These policies and algorithms were followed during the patient's care in the ED.    As part of my medical decision making, I reviewed the following  data within the [place] notes reviewed and incorporated, Labs reviewed , Old chart reviewed, Radiograph reviewed , Notes from prior ED visits, and [place] Controlled Substance Database  ____________________________________________   FINAL CLINICAL IMPRESSION(S) / ED DIAGNOSES  Final diagnoses:  Acute bronchitis, unspecified organism      NEW MEDICATIONS STARTED DURING THIS VISIT:  New Prescriptions   ALBUTEROL (PROVENTIL) (2.5 MG/3ML) 0.083% NEBULIZER SOLUTION    Take 3 mLs (2.5 mg total) by nebulization every 4 (four) hours as needed for wheezing or shortness of breath.   AZITHROMYCIN (ZITHROMAX Z-PAK) 250 MG TABLET    2 pills today then 1 pill a day for 4 days   PREDNISONE (STERAPRED UNI-PAK 21 TAB) 10 MG (21) TBPK TABLET    Take 6  pills on day one then decrease by 1 pill each day     Note:  This document was prepared using Dragon voice recognition software and may include unintentional dictation errors.    Versie Starks, PA-C 10/09/20 1521    Lucrezia Starch, MD 10/09/20 [phone]

## 2020-10-09 NOTE — Discharge Instructions (Addendum)
Follow-up with your regular doctor if not improving in 2 to 3 days.  Return emergency department worsening  use medications as prescribed

## 2020-10-09 NOTE — ED Notes (Signed)
See triage note  Presents with sore throat and some nasal congestion   States sx;s started a couple of days ago  Afebrile on  arrival

## 2020-10-19 IMAGING — DX DG FOOT COMPLETE 3+V*L*
3 series · 3 of 3 positions shown · non-contrast
Comparison: None.

CLINICAL DATA: Evaluate foreign body.

EXAM:
LEFT FOOT - COMPLETE 3+ VIEW

[foot ap]
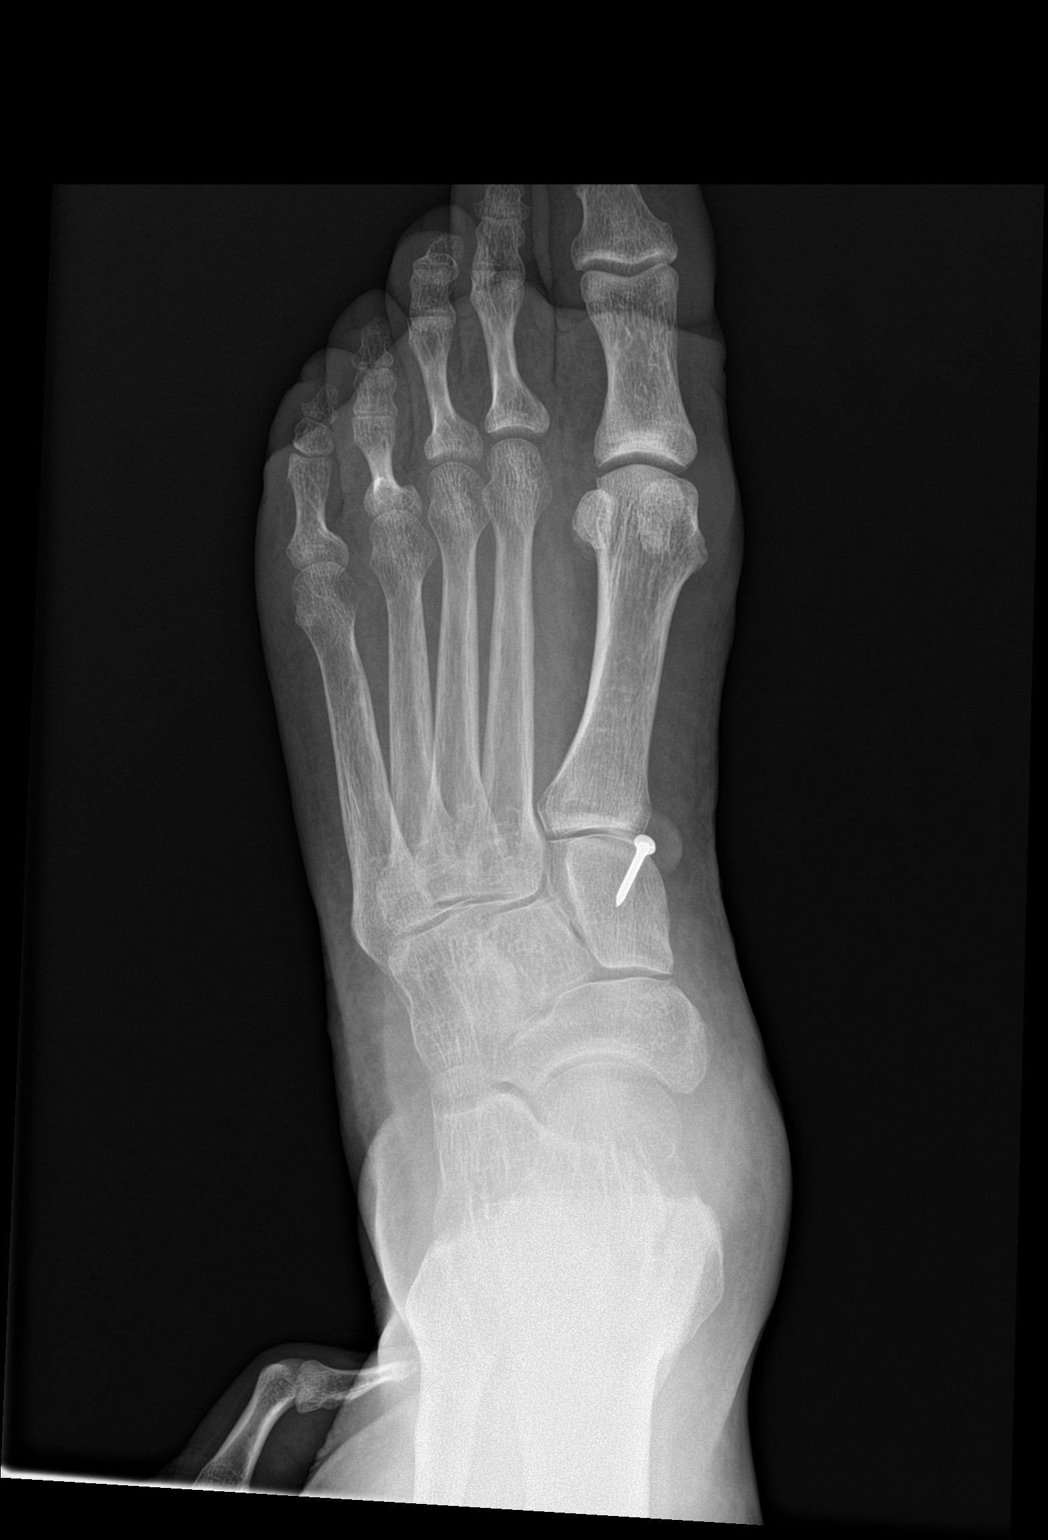

[foot obl]
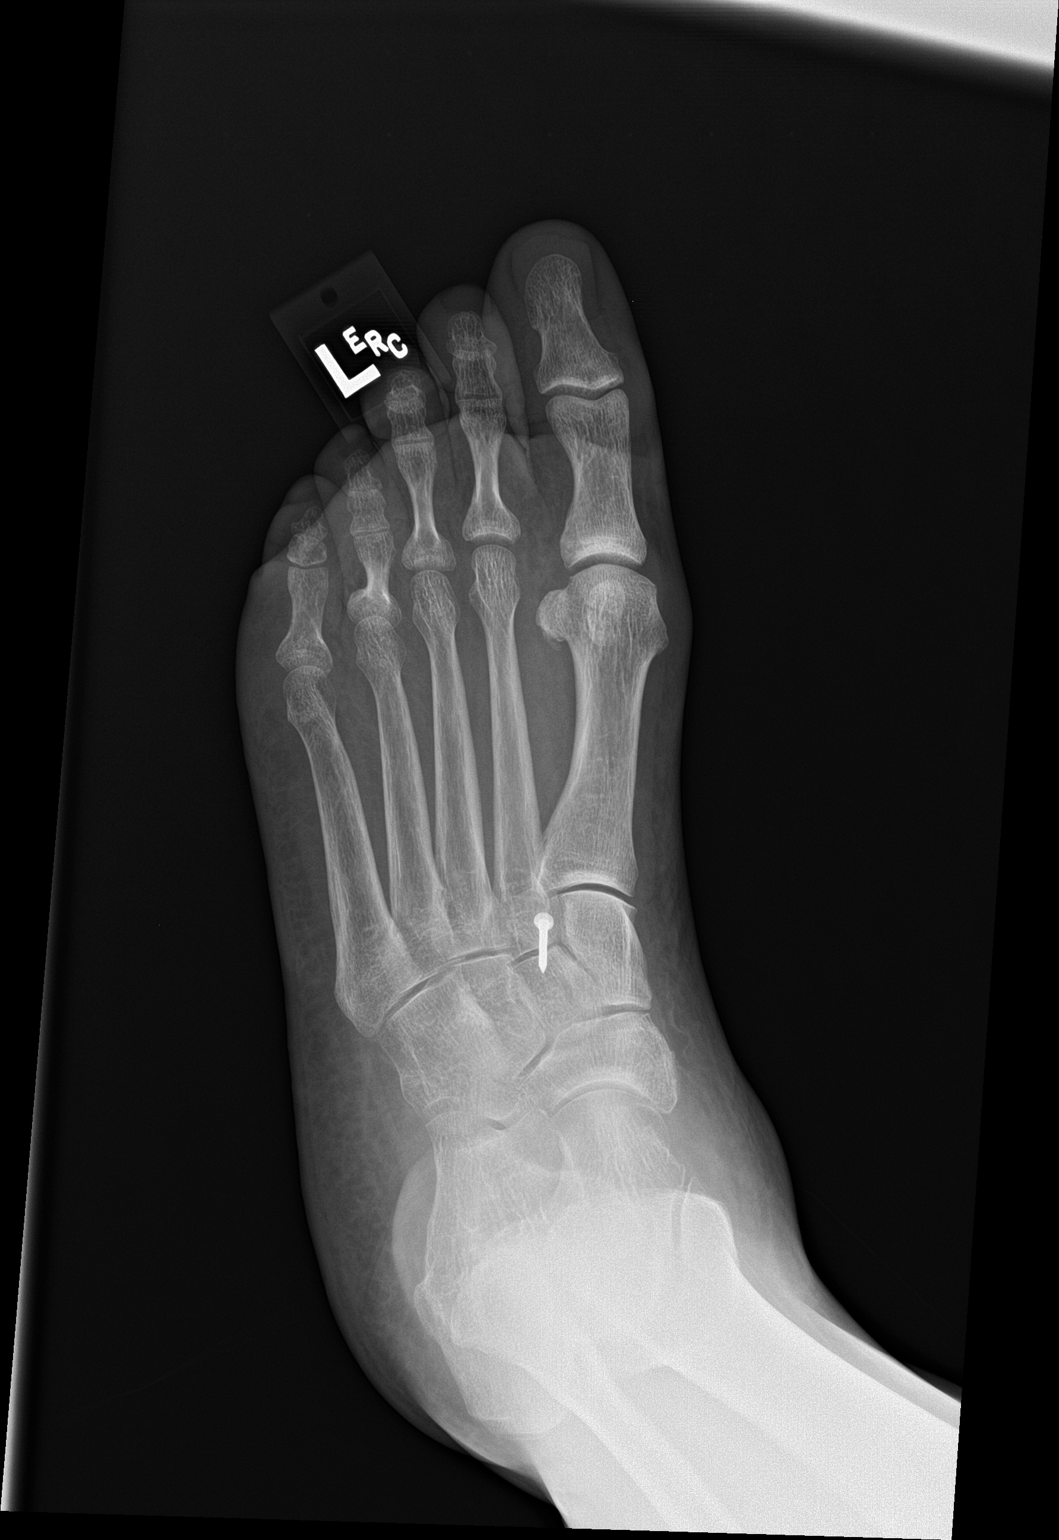

[foot lat]
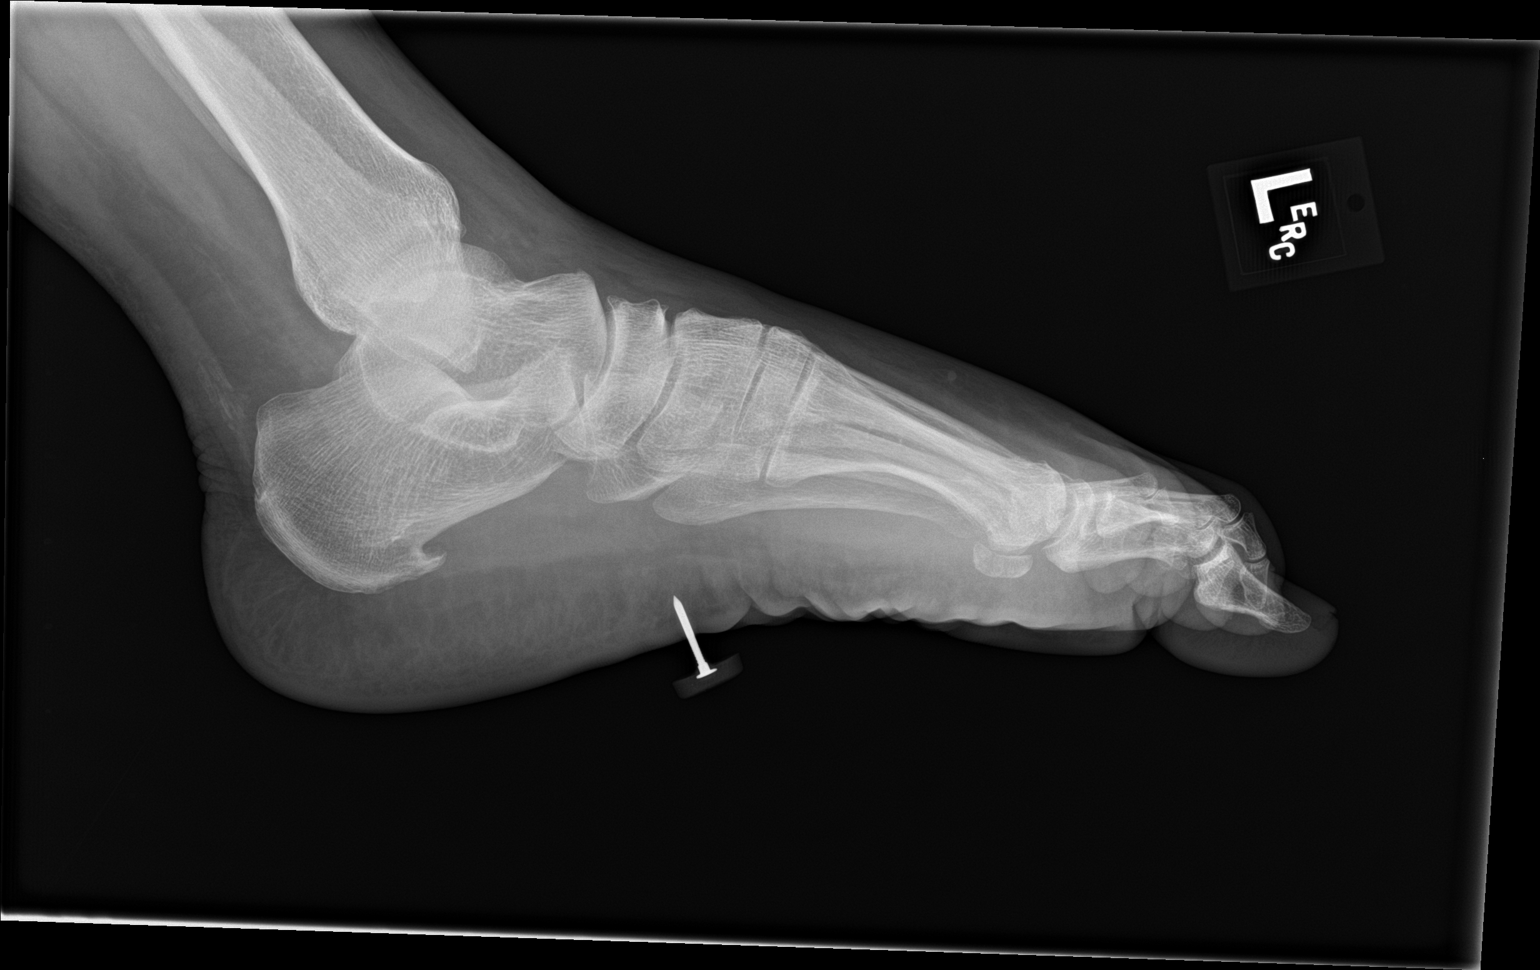

[3 of 3 positions shown; findings below may reference images not displayed]

FINDINGS: Metallic nail-like foreign body within the soft tissues underlying
the midfoot. No underlying osseous abnormality. No additional
foreign body.
IMPRESSION: Metallic nail-like foreign body within the soft tissues underlying
the midfoot. No osseous abnormality.

## 2020-11-25 ENCOUNTER — Other Ambulatory Visit: Payer: Self-pay | Admitting: Family Medicine

## 2020-11-25 DIAGNOSIS — R0600 Dyspnea, unspecified: Secondary | ICD-10-CM

## 2020-11-25 DIAGNOSIS — R609 Edema, unspecified: Secondary | ICD-10-CM

## 2021-02-08 DIAGNOSIS — Z79899 Other long term (current) drug therapy: Secondary | ICD-10-CM | POA: Diagnosis not present

## 2021-02-08 DIAGNOSIS — Z9181 History of falling: Secondary | ICD-10-CM | POA: Diagnosis not present

## 2021-02-08 DIAGNOSIS — M545 Low back pain, unspecified: Secondary | ICD-10-CM | POA: Diagnosis not present

## 2021-02-08 DIAGNOSIS — G8929 Other chronic pain: Secondary | ICD-10-CM | POA: Diagnosis not present

## 2021-02-08 DIAGNOSIS — Z6839 Body mass index (BMI) 39.0-39.9, adult: Secondary | ICD-10-CM | POA: Diagnosis not present

## 2021-02-08 DIAGNOSIS — E349 Endocrine disorder, unspecified: Secondary | ICD-10-CM | POA: Diagnosis not present

## 2021-02-08 DIAGNOSIS — E119 Type 2 diabetes mellitus without complications: Secondary | ICD-10-CM | POA: Diagnosis not present

## 2021-03-06 DIAGNOSIS — R300 Dysuria: Secondary | ICD-10-CM | POA: Diagnosis not present

## 2021-03-06 DIAGNOSIS — Z6841 Body Mass Index (BMI) 40.0 and over, adult: Secondary | ICD-10-CM | POA: Diagnosis not present

## 2021-03-06 DIAGNOSIS — E1169 Type 2 diabetes mellitus with other specified complication: Secondary | ICD-10-CM | POA: Diagnosis not present

## 2021-03-08 DIAGNOSIS — E349 Endocrine disorder, unspecified: Secondary | ICD-10-CM | POA: Diagnosis not present

## 2021-03-08 DIAGNOSIS — G8929 Other chronic pain: Secondary | ICD-10-CM | POA: Diagnosis not present

## 2021-03-08 DIAGNOSIS — M545 Low back pain, unspecified: Secondary | ICD-10-CM | POA: Diagnosis not present

## 2021-03-08 DIAGNOSIS — Z9181 History of falling: Secondary | ICD-10-CM | POA: Diagnosis not present

## 2021-03-08 DIAGNOSIS — E119 Type 2 diabetes mellitus without complications: Secondary | ICD-10-CM | POA: Diagnosis not present

## 2021-03-08 DIAGNOSIS — Z6839 Body mass index (BMI) 39.0-39.9, adult: Secondary | ICD-10-CM | POA: Diagnosis not present

## 2021-03-30 DIAGNOSIS — J019 Acute sinusitis, unspecified: Secondary | ICD-10-CM | POA: Diagnosis not present

## 2021-03-30 DIAGNOSIS — R059 Cough, unspecified: Secondary | ICD-10-CM | POA: Diagnosis not present

## 2021-04-04 DIAGNOSIS — Z1211 Encounter for screening for malignant neoplasm of colon: Secondary | ICD-10-CM | POA: Diagnosis not present

## 2021-04-04 DIAGNOSIS — E1169 Type 2 diabetes mellitus with other specified complication: Secondary | ICD-10-CM | POA: Diagnosis not present

## 2021-04-04 DIAGNOSIS — K219 Gastro-esophageal reflux disease without esophagitis: Secondary | ICD-10-CM | POA: Diagnosis not present

## 2021-04-04 DIAGNOSIS — Z6841 Body Mass Index (BMI) 40.0 and over, adult: Secondary | ICD-10-CM | POA: Diagnosis not present

## 2021-04-07 ENCOUNTER — Telehealth: Payer: Self-pay

## 2021-04-07 ENCOUNTER — Other Ambulatory Visit: Payer: Self-pay

## 2021-04-07 NOTE — Telephone Encounter (Signed)
Called no answer no voicemail

## 2021-04-08 ENCOUNTER — Telehealth: Payer: Self-pay

## 2021-04-08 NOTE — Telephone Encounter (Signed)
Called no answer no voicemail letter sent ?

## 2021-04-10 DIAGNOSIS — Z79899 Other long term (current) drug therapy: Secondary | ICD-10-CM | POA: Diagnosis not present

## 2021-04-10 DIAGNOSIS — Z9181 History of falling: Secondary | ICD-10-CM | POA: Diagnosis not present

## 2021-04-10 DIAGNOSIS — E349 Endocrine disorder, unspecified: Secondary | ICD-10-CM | POA: Diagnosis not present

## 2021-04-10 DIAGNOSIS — G8929 Other chronic pain: Secondary | ICD-10-CM | POA: Diagnosis not present

## 2021-04-10 DIAGNOSIS — E119 Type 2 diabetes mellitus without complications: Secondary | ICD-10-CM | POA: Diagnosis not present

## 2021-04-10 DIAGNOSIS — Z6839 Body mass index (BMI) 39.0-39.9, adult: Secondary | ICD-10-CM | POA: Diagnosis not present

## 2021-04-10 DIAGNOSIS — M545 Low back pain, unspecified: Secondary | ICD-10-CM | POA: Diagnosis not present

## 2021-04-17 ENCOUNTER — Emergency Department: Payer: Medicare HMO

## 2021-04-17 ENCOUNTER — Other Ambulatory Visit: Payer: Self-pay

## 2021-04-17 ENCOUNTER — Emergency Department
Admission: EM | Admit: 2021-04-17 | Discharge: 2021-04-17 | Disposition: A | Discharge status: Home / Self Care | Payer: Medicare HMO | Attending: Emergency Medicine | Admitting: Emergency Medicine

## 2021-04-17 DIAGNOSIS — R109 Unspecified abdominal pain: Secondary | ICD-10-CM | POA: Diagnosis not present

## 2021-04-17 DIAGNOSIS — R1032 Left lower quadrant pain: Secondary | ICD-10-CM | POA: Diagnosis present

## 2021-04-17 DIAGNOSIS — K5792 Diverticulitis of intestine, part unspecified, without perforation or abscess without bleeding: Secondary | ICD-10-CM | POA: Diagnosis not present

## 2021-04-17 DIAGNOSIS — I700 Atherosclerosis of aorta: Secondary | ICD-10-CM | POA: Diagnosis not present

## 2021-04-17 LAB — COMPREHENSIVE METABOLIC PANEL
ALT: 17 U/L (ref 0–44)
AST: 22 U/L (ref 15–41)
Albumin: 3.8 g/dL (ref 3.5–5.0)
Alkaline Phosphatase: 91 U/L (ref 38–126)
Anion gap: 10 (ref 5–15)
BUN: 17 mg/dL (ref 6–20)
CO2: 26 mmol/L (ref 22–32)
Calcium: 9.3 mg/dL (ref 8.9–10.3)
Chloride: 103 mmol/L (ref 98–111)
Creatinine, Ser: 1.07 mg/dL — ABNORMAL HIGH (ref 0.44–1.00)
GFR, Estimated: 60 mL/min — ABNORMAL LOW (ref >60)
Glucose, Bld: 127 mg/dL — ABNORMAL HIGH (ref 70–99)
Potassium: 3.6 mmol/L (ref 3.5–5.1)
Sodium: 139 mmol/L (ref 135–145)
Total Bilirubin: 0.4 mg/dL (ref 0.3–1.2)
Total Protein: 7.6 g/dL (ref 6.5–8.1)

## 2021-04-17 LAB — URINALYSIS, ROUTINE W REFLEX MICROSCOPIC
Bilirubin Urine: NEGATIVE
Glucose, UA: NEGATIVE mg/dL
Hgb urine dipstick: NEGATIVE
Ketones, ur: 5 mg/dL — AB
Nitrite: NEGATIVE
Protein, ur: NEGATIVE mg/dL
Specific Gravity, Urine: 1.027 (ref 1.005–1.030)
pH: 5 (ref 5.0–8.0)

## 2021-04-17 LAB — CBC
HCT: 38.2 % (ref 36.0–46.0)
Hemoglobin: 11.7 g/dL — ABNORMAL LOW (ref 12.0–15.0)
MCH: 24.7 pg — ABNORMAL LOW (ref 26.0–34.0)
MCHC: 30.6 g/dL (ref 30.0–36.0)
MCV: 80.8 fL (ref 80.0–100.0)
Platelets: 325 10*3/uL (ref 150–400)
RBC: 4.73 MIL/uL (ref 3.87–5.11)
RDW: 14.6 % (ref 11.5–15.5)
WBC: 8.1 10*3/uL (ref 4.0–10.5)
nRBC: 0 % (ref 0.0–0.2)

## 2021-04-17 LAB — LIPASE, BLOOD: Lipase: 47 U/L (ref 11–51)

## 2021-04-17 MED ORDER — CIPROFLOXACIN IN D5W 400 MG/200ML IV SOLN
400.0000 mg | Freq: Once | INTRAVENOUS | Status: AC
  Administered 2021-04-17: 400 mg via INTRAVENOUS
  Filled 2021-04-17: qty 200

## 2021-04-17 MED ORDER — ONDANSETRON HCL 4 MG/2ML IJ SOLN
4.0000 mg | Freq: Once | INTRAMUSCULAR | Status: AC
  Administered 2021-04-17: 4 mg via INTRAVENOUS
  Filled 2021-04-17: qty 2

## 2021-04-17 MED ORDER — TRAMADOL HCL 50 MG PO TABS: 50.0000 mg | ORAL_TABLET | Freq: Four times a day (QID) | ORAL | 0 refills | Status: DC | PRN

## 2021-04-17 MED ORDER — METRONIDAZOLE 500 MG/100ML IV SOLN
500.0000 mg | Freq: Once | INTRAVENOUS | Status: AC
  Administered 2021-04-17: 500 mg via INTRAVENOUS
  Filled 2021-04-17: qty 100

## 2021-04-17 MED ORDER — CIPROFLOXACIN HCL 500 MG PO TABS: 500.0000 mg | ORAL_TABLET | Freq: Two times a day (BID) | ORAL | 0 refills | Status: AC

## 2021-04-17 MED ORDER — METRONIDAZOLE 500 MG PO TABS: 500.0000 mg | ORAL_TABLET | Freq: Three times a day (TID) | ORAL | 0 refills | Status: AC

## 2021-04-17 MED ORDER — ONDANSETRON 8 MG PO TBDP: 8.0000 mg | ORAL_TABLET | Freq: Three times a day (TID) | ORAL | 0 refills | Status: DC | PRN

## 2021-04-17 MED ORDER — MORPHINE SULFATE (PF) 4 MG/ML IV SOLN
4.0000 mg | Freq: Once | INTRAVENOUS | Status: AC
  Administered 2021-04-17: 4 mg via INTRAVENOUS
  Filled 2021-04-17: qty 1

## 2021-04-17 MED ORDER — IOHEXOL 300 MG/ML  SOLN
100.0000 mL | Freq: Once | INTRAMUSCULAR | Status: AC | PRN
  Administered 2021-04-17: 100 mL via INTRAVENOUS
  Filled 2021-04-17: qty 100

## 2021-04-17 NOTE — ED Notes (Signed)
Pt denies dizziness or nausea. Reports BP is not typically this low.  ?

## 2021-04-17 NOTE — ED Notes (Signed)
Pt alert, laying calmly on stretcher, skin dry, resp reg/unlabored.  ?

## 2021-04-17 NOTE — ED Provider Notes (Signed)
? ?[facility] ?Provider Note ? ? ? Event Date/Time  ? First MD Initiated Contact with Patient 04/17/21 1638   ?  (approximate) ? ? ?History  ? ?Abdominal Pain ? ? ?HPI ? ?Vanessa Fisher is a 61 y.o. female with a history of GERD and diverticulitis who presents with left-sided abdominal pain, initially mainly in the upper abdomen, but now in the mid and lower abdomen on the left, persistent course over the last 1 to 2 weeks.  It is worsening.  She reports associated belching and a sour taste in her mouth but denies nausea or vomiting.  She has not had any diarrhea.  She denies any urinary symptoms. ? ? ?Physical Exam  ? ?Triage Vital Signs: ?ED Triage Vitals  ?Enc Vitals Group  ?   BP 04/17/21 1541 (!) 113/36  ?   Pulse Rate 04/17/21 1541 93  ?   Resp 04/17/21 1541 17  ?   Temp 04/17/21 1541 98.3 ?F (36.8 ?C)  ?   Temp Source 04/17/21 1541 Oral  ?   SpO2 04/17/21 1541 95 %  ?   Weight 04/17/21 1637 240 lb 1.3 oz (108.9 kg)  ?   Height 04/17/21 1637 '5\' 6"'$  (1.676 m)  ?   Head Circumference --   ?   Peak Flow --   ?   Pain Score 04/17/21 1531 10  ?   Pain Loc --   ?   Pain Edu? --   ?   Excl. in [place]? --   ? ? ?Most recent vital signs: ?Vitals:  ? 04/17/21 1900 04/17/21 1908  ?BP:  (!) 96/58  ?Pulse: 71 77  ?Resp:    ?Temp:    ?SpO2: 97% 99%  ? ? ? ?General: Alert and oriented, somewhat uncomfortable appearing but in no acute distress. ?CV:  Good peripheral perfusion.  ?Resp:  Normal effort.  ?Abd:  No distention.  Left lower quadrant tenderness. ?Other:  Moist mucous membranes.  No scleral icterus. ? ? ?ED Results / Procedures / Treatments  ? ?Labs ?(all labs ordered are listed, but only abnormal results are displayed) ?Labs Reviewed  ?COMPREHENSIVE METABOLIC PANEL - Abnormal; Notable for the following components:  ?    Result Value  ? Glucose, Bld 127 (*)   ? Creatinine, Ser 1.07 (*)   ? GFR, Estimated 60 (*)   ? All other components within normal limits  ?CBC - Abnormal; Notable for the  following components:  ? Hemoglobin 11.7 (*)   ? MCH 24.7 (*)   ? All other components within normal limits  ?URINALYSIS, ROUTINE W REFLEX MICROSCOPIC - Abnormal; Notable for the following components:  ? Color, Urine YELLOW (*)   ? APPearance HAZY (*)   ? Ketones, ur 5 (*)   ? Leukocytes,Ua MODERATE (*)   ? Bacteria, UA RARE (*)   ? All other components within normal limits  ?LIPASE, BLOOD  ? ? ? ?EKG ? ? ? ? ?RADIOLOGY ? ?CT abdomen/pelvis: I independently viewed and interpreted the images; there are no dilated bowel loops or free fluid.  Radiology report indicates uncomplicated diverticulitis. ? ? ?PROCEDURES: ? ?Critical Care performed: No ? ?Procedures ? ? ?MEDICATIONS ORDERED IN ED: ?Medications  ?ciprofloxacin (CIPRO) IVPB 400 mg (400 mg Intravenous New Bag/Given 04/17/21 1903)  ?metroNIDAZOLE (FLAGYL) IVPB 500 mg (500 mg Intravenous New Bag/Given 04/17/21 1904)  ?ondansetron [facility]) injection 4 mg (4 mg Intravenous Given 04/17/21 1809)  ?morphine (PF) 4 MG/ML injection 4 mg (  4 mg Intravenous Given 04/17/21 1814)  ?iohexol (OMNIPAQUE) 300 MG/ML solution 100 mL (100 mLs Intravenous Contrast Given 04/17/21 1814)  ? ? ? ?IMPRESSION / MDM / ASSESSMENT AND PLAN / ED COURSE  ?I reviewed the triage vital signs and the nursing notes. ? ?61 year old female with PMH as noted above presents with primarily left-sided abdominal pain over the last week with associated sour taste in her mouth and belching but no vomiting or diarrhea.  She is on Protonix for GERD and has been taking it with no relief. ? ?Initial lab work-up is reassuring.  There is no leukocytosis.  Electrolytes and LFTs are normal.  Lipase is normal.  Urinalysis does show some WBCs. ? ?Differential diagnosis includes, but is not limited to, gastritis, GERD flareup, diverticulitis, colitis, gastroenteritis.  I have a lower suspicion for small bowel obstruction or volvulus given the duration of the symptoms. ? ?We will give analgesia, obtain a CT, and  reassess. ? ?----------------------------------------- ?7:29 PM on 04/17/2021 ?----------------------------------------- ? ?CT shows uncomplicated diverticulitis of the descending colon.  The patient is feeling better after analgesia.  I counseled her on the results of the work-up.  I did consider whether the patient may require admission, however since she has had no nausea or vomiting, is tolerating p.o., and has uncomplicated diverticulitis and reassuring lab work-up, she is appropriate for discharge with oral antibiotics.  I ordered a dose of IV Cipro and Flagyl to be given here (the patient is allergic to penicillins), and have prescribed the same for home. ? ?Return precautions given, and the patient expresses understanding.  She is stable for discharge at this time. ? ? ?FINAL CLINICAL IMPRESSION(S) / ED DIAGNOSES  ? ?Final diagnoses:  ?Diverticulitis  ? ? ? ?Rx / DC Orders  ? ?ED Discharge Orders   ? ?      Ordered  ?  ciprofloxacin (CIPRO) 500 MG tablet  2 times daily       ? 04/17/21 1927  ?  metroNIDAZOLE (FLAGYL) 500 MG tablet  3 times daily       ? 04/17/21 1927  ?  traMADol (ULTRAM) 50 MG tablet  Every 6 hours PRN       ? 04/17/21 1927  ?  ondansetron (ZOFRAN-ODT) 8 MG disintegrating tablet  Every 8 hours PRN       ? 04/17/21 1927  ? ?  ?  ? ?  ? ? ? ?Note:  This document was prepared using Dragon voice recognition software and may include unintentional dictation errors.  ?  ?Arta Silence, MD ?04/17/21 1930 ? ?

## 2021-04-17 NOTE — Discharge Instructions (Addendum)
Take the ciprofloxacin and Flagyl (metronidazole) for the next 5 days as prescribed.  These are antibiotics.  You may take the tramadol as needed for pain, and the Zofran (ondansetron) as needed for nausea. ? ?Follow-up with your primary care doctor within 1 to 2 weeks. ? ?Return to the ER for new, worsening, or persistent severe pain, nausea or vomiting, fever, weakness, blood in the stool, or any other new or worsening symptoms that concern you. ?

## 2021-04-17 NOTE — ED Triage Notes (Signed)
Pt comes with c/o abdominal pain and burping. Pt states this started two weeks ago. Pt states she feels tender in stomach. ? ?Pt denies any nausea or vomiting. ?

## 2021-05-15 ENCOUNTER — Encounter: Payer: Self-pay | Admitting: Family Medicine

## 2021-05-16 ENCOUNTER — Telehealth: Payer: Self-pay

## 2021-05-16 NOTE — Telephone Encounter (Signed)
CALLED PATIENT NO ANSWER LEFT VOICEMAIL FOR A CALL BACK ? ?

## 2021-05-17 DIAGNOSIS — M129 Arthropathy, unspecified: Secondary | ICD-10-CM | POA: Diagnosis not present

## 2021-05-17 DIAGNOSIS — G8929 Other chronic pain: Secondary | ICD-10-CM | POA: Diagnosis not present

## 2021-05-17 DIAGNOSIS — R030 Elevated blood-pressure reading, without diagnosis of hypertension: Secondary | ICD-10-CM | POA: Diagnosis not present

## 2021-05-17 DIAGNOSIS — M545 Low back pain, unspecified: Secondary | ICD-10-CM | POA: Diagnosis not present

## 2021-05-17 DIAGNOSIS — E559 Vitamin D deficiency, unspecified: Secondary | ICD-10-CM | POA: Diagnosis not present

## 2021-05-17 DIAGNOSIS — Z9181 History of falling: Secondary | ICD-10-CM | POA: Diagnosis not present

## 2021-05-17 DIAGNOSIS — Z6839 Body mass index (BMI) 39.0-39.9, adult: Secondary | ICD-10-CM | POA: Diagnosis not present

## 2021-05-17 DIAGNOSIS — E119 Type 2 diabetes mellitus without complications: Secondary | ICD-10-CM | POA: Diagnosis not present

## 2021-05-17 DIAGNOSIS — R5383 Other fatigue: Secondary | ICD-10-CM | POA: Diagnosis not present

## 2021-05-17 DIAGNOSIS — E7800 Pure hypercholesterolemia, unspecified: Secondary | ICD-10-CM | POA: Diagnosis not present

## 2021-05-17 DIAGNOSIS — Z79899 Other long term (current) drug therapy: Secondary | ICD-10-CM | POA: Diagnosis not present

## 2021-05-20 ENCOUNTER — Encounter: Payer: Self-pay | Admitting: Emergency Medicine

## 2021-05-20 ENCOUNTER — Emergency Department
Admission: EM | Admit: 2021-05-20 | Discharge: 2021-05-20 | Disposition: A | Discharge status: Home / Self Care | Payer: Medicare HMO | Attending: Emergency Medicine | Admitting: Emergency Medicine

## 2021-05-20 ENCOUNTER — Emergency Department: Payer: Medicare HMO

## 2021-05-20 ENCOUNTER — Other Ambulatory Visit: Payer: Self-pay

## 2021-05-20 ENCOUNTER — Telehealth: Payer: Self-pay

## 2021-05-20 DIAGNOSIS — K760 Fatty (change of) liver, not elsewhere classified: Secondary | ICD-10-CM | POA: Diagnosis not present

## 2021-05-20 DIAGNOSIS — R1013 Epigastric pain: Secondary | ICD-10-CM | POA: Diagnosis not present

## 2021-05-20 DIAGNOSIS — K2950 Unspecified chronic gastritis without bleeding: Secondary | ICD-10-CM | POA: Insufficient documentation

## 2021-05-20 DIAGNOSIS — R1011 Right upper quadrant pain: Secondary | ICD-10-CM | POA: Diagnosis not present

## 2021-05-20 LAB — URINALYSIS, ROUTINE W REFLEX MICROSCOPIC
Bacteria, UA: NONE SEEN
Bilirubin Urine: NEGATIVE
Glucose, UA: NEGATIVE mg/dL
Ketones, ur: NEGATIVE mg/dL
Nitrite: NEGATIVE
Protein, ur: NEGATIVE mg/dL
Specific Gravity, Urine: 1.024 (ref 1.005–1.030)
pH: 5 (ref 5.0–8.0)

## 2021-05-20 LAB — CBC WITH DIFFERENTIAL/PLATELET
Abs Immature Granulocytes: 0.01 10*3/uL (ref 0.00–0.07)
Basophils Absolute: 0.1 10*3/uL (ref 0.0–0.1)
Basophils Relative: 1 %
Eosinophils Absolute: 0.3 10*3/uL (ref 0.0–0.5)
Eosinophils Relative: 5 %
HCT: 39.3 % (ref 36.0–46.0)
Hemoglobin: 12.3 g/dL (ref 12.0–15.0)
Immature Granulocytes: 0 %
Lymphocytes Relative: 41 %
Lymphs Abs: 2.2 10*3/uL (ref 0.7–4.0)
MCH: 25.5 pg — ABNORMAL LOW (ref 26.0–34.0)
MCHC: 31.3 g/dL (ref 30.0–36.0)
MCV: 81.4 fL (ref 80.0–100.0)
Monocytes Absolute: 0.4 10*3/uL (ref 0.1–1.0)
Monocytes Relative: 8 %
Neutro Abs: 2.4 10*3/uL (ref 1.7–7.7)
Neutrophils Relative %: 45 %
Platelets: 307 10*3/uL (ref 150–400)
RBC: 4.83 MIL/uL (ref 3.87–5.11)
RDW: 14.6 % (ref 11.5–15.5)
WBC: 5.3 10*3/uL (ref 4.0–10.5)
nRBC: 0 % (ref 0.0–0.2)

## 2021-05-20 LAB — COMPREHENSIVE METABOLIC PANEL
ALT: 14 U/L (ref 0–44)
AST: 17 U/L (ref 15–41)
Albumin: 3.9 g/dL (ref 3.5–5.0)
Alkaline Phosphatase: 99 U/L (ref 38–126)
Anion gap: 10 (ref 5–15)
BUN: 10 mg/dL (ref 6–20)
CO2: 25 mmol/L (ref 22–32)
Calcium: 9.5 mg/dL (ref 8.9–10.3)
Chloride: 104 mmol/L (ref 98–111)
Creatinine, Ser: 0.64 mg/dL (ref 0.44–1.00)
GFR, Estimated: >60 mL/min (ref >60)
Glucose, Bld: 99 mg/dL (ref 70–99)
Potassium: 4 mmol/L (ref 3.5–5.1)
Sodium: 139 mmol/L (ref 135–145)
Total Bilirubin: 0.5 mg/dL (ref 0.3–1.2)
Total Protein: 7.6 g/dL (ref 6.5–8.1)

## 2021-05-20 LAB — LIPASE, BLOOD: Lipase: 42 U/L (ref 11–51)

## 2021-05-20 MED ORDER — METOCLOPRAMIDE HCL 5 MG/ML IJ SOLN
10.0000 mg | Freq: Once | INTRAMUSCULAR | Status: AC
  Administered 2021-05-20: 10 mg via INTRAMUSCULAR
  Filled 2021-05-20: qty 2

## 2021-05-20 MED ORDER — LIDOCAINE VISCOUS HCL 2 % MT SOLN
15.0000 mL | Freq: Once | OROMUCOSAL | Status: AC
  Administered 2021-05-20: 15 mL via ORAL
  Filled 2021-05-20: qty 15

## 2021-05-20 MED ORDER — ALUM & MAG HYDROXIDE-SIMETH 200-200-20 MG/5ML PO SUSP
30.0000 mL | Freq: Once | ORAL | Status: AC
  Administered 2021-05-20: 30 mL via ORAL
  Filled 2021-05-20: qty 30

## 2021-05-20 NOTE — ED Notes (Signed)
Pt complains of abdominal pain and belching.  ?

## 2021-05-20 NOTE — ED Provider Notes (Signed)
? ?[facility] ?Provider Note ? ? ? Event Date/Time  ? First MD Initiated Contact with Patient 05/20/21 1339   ?  (approximate) ? ? ?History  ? ?Abdominal Pain ? ? ?HPI ? ?Vanessa Fisher is a 61 y.o. female who presents to the ED for evaluation of Abdominal Pain ?  ?Patient presents to the ED for admission of acute on chronic postprandial epigastric and LUQ belching and burning pain.  She reports it has been multiple months, but has seemed worse the past few weeks.  Reports that she really wanted to get checked out and is concerned about her gallbladder.  She reports compliance with her pantoprazole twice daily for her reflux, but still has a lot of reflux symptoms and belching after eating. ? ? ?Physical Exam  ? ?Triage Vital Signs: ?ED Triage Vitals  ?Enc Vitals Group  ?   BP 05/20/21 1145 (!) 147/75  ?   Pulse Rate 05/20/21 1145 (!) 59  ?   Resp 05/20/21 1145 18  ?   Temp 05/20/21 1145 98.1 ?F (36.7 ?C)  ?   Temp Source 05/20/21 1145 Oral  ?   SpO2 05/20/21 1145 98 %  ?   Weight 05/20/21 1144 220 lb (99.8 kg)  ?   Height 05/20/21 1144 '5\' 6"'$  (1.676 m)  ?   Head Circumference --   ?   Peak Flow --   ?   Pain Score 05/20/21 1142 10  ?   Pain Loc --   ?   Pain Edu? --   ?   Excl. in [place]? --   ? ? ?Most recent vital signs: ?Vitals:  ? 05/20/21 1145 05/20/21 1315  ?BP: (!) 147/75 (!) 123/44  ?Pulse: (!) 59 62  ?Resp: 18   ?Temp: 98.1 ?F (36.7 ?C)   ?SpO2: 98% 100%  ? ? ?General: Awake, no distress.  ?CV:  Good peripheral perfusion.  ?Resp:  Normal effort.  ?Abd:  No distention.  Minimal epigastric and LUQ tenderness without peritoneal features.  Benign lower abdomen.  No guarding with RUQ tenderness or peritoneal features throughout. ?MSK:  No deformity noted.  ?Neuro:  No focal deficits appreciated. ?Other:   ? ? ?ED Results / Procedures / Treatments  ? ?Labs ?(all labs ordered are listed, but only abnormal results are displayed) ?Labs Reviewed  ?CBC WITH DIFFERENTIAL/PLATELET - Abnormal;  Notable for the following components:  ?    Result Value  ? MCH 25.5 (*)   ? All other components within normal limits  ?URINALYSIS, ROUTINE W REFLEX MICROSCOPIC - Abnormal; Notable for the following components:  ? Color, Urine YELLOW (*)   ? APPearance HAZY (*)   ? Hgb urine dipstick SMALL (*)   ? Leukocytes,Ua MODERATE (*)   ? All other components within normal limits  ?COMPREHENSIVE METABOLIC PANEL  ?LIPASE, BLOOD  ? ? ?EKG ? ? ?RADIOLOGY ?RUQ ultrasound reviewed by me with normal-appearing gallbladder without evidence of stones or cholecystitis. ?Plain film of the abdomen reviewed by me without evidence of SBO. ? ?Official radiology report(s): ?DG Abd 1 View ? ?Result Date: 05/20/2021 ?CLINICAL DATA:  Right upper quadrant pain. EXAM: ABDOMEN - 1 VIEW COMPARISON:  04/17/2021 CT FINDINGS: Two supine views. No gaseous distention of bowel loops to suggest obstruction. The stomach is mildly prominent, gas and food filled. Presumed phleboliths within the pelvis. No abnormal abdominal calcifications. No appendicolith. IMPRESSION: No acute findings. Electronically Signed   By: Abigail Miyamoto M.D.   On:  05/20/2021 13:47  ? ?US Abdomen Limited RUQ (LIVER/GB) ? ?Result Date: 05/20/2021 ?CLINICAL DATA:  Right upper quadrant pain for 3 months EXAM: ULTRASOUND ABDOMEN LIMITED RIGHT UPPER QUADRANT COMPARISON:  CT abdomen pelvis 04/17/2021 FINDINGS: Gallbladder: No gallstones or wall thickening visualized. No sonographic Murphy sign noted by sonographer. Common bile duct: Diameter: 4 mm Liver: Visualization is somewhat limited by increased liver density. Within this limitation, no focal lesion identified. Increased parenchymal echogenicity. Portal vein is patent on color Doppler imaging with normal direction of blood flow towards the liver. Other: None. IMPRESSION: 1. Hepatic steatosis. 2. No cholelithiasis or evidence of cholecystitis. Electronically Signed   By: Merilyn Baba M.D.   On: 05/20/2021 14:36   ? ?PROCEDURES and  INTERVENTIONS: ? ?Procedures ? ?Medications  ?alum & mag hydroxide-simeth (MAALOX/MYLANTA) 200-200-20 MG/5ML suspension 30 mL (has no administration in time range)  ?  And  ?lidocaine (XYLOCAINE) 2 % viscous mouth solution 15 mL (has no administration in time range)  ?metoCLOPramide (REGLAN) injection 10 mg (has no administration in time range)  ? ? ? ?IMPRESSION / MDM / ASSESSMENT AND PLAN / ED COURSE  ?I reviewed the triage vital signs and the nursing notes. ? ?61 year old woman with history of GERD presents to the ED with acute on chronic epigastric postprandial discomfort and belching sensation, likely GERD or gastritis.  Reassuring vital signs and examination.  Has some minimal epigastric and LUQ tenderness without peritoneal features.  Her blood work is benign with normal CBC, metabolic panel and lipase.  No evidence of biliary obstruction.  No signs of acute cystitis.  KUB without obstructive pathology and RUQ ultrasound without evidence of gallstones.  We will provide symptomatic measures for her likely GERD and refer to GI.  I see no barriers to outpatient management.  Less likely atypical ACS. ? ?  ? ? ?FINAL CLINICAL IMPRESSION(S) / ED DIAGNOSES  ? ?Final diagnoses:  ?Chronic gastritis without bleeding, unspecified gastritis type  ?Epigastric pain  ? ? ? ?Rx / DC Orders  ? ?ED Discharge Orders   ? ? None  ? ?  ? ? ? ?Note:  This document was prepared using Dragon voice recognition software and may include unintentional dictation errors. ?  ?Vladimir Crofts, MD ?05/20/21 1458 ? ?

## 2021-05-20 NOTE — Telephone Encounter (Signed)
CALLED PATIENT NO ANSWER LEFT VOICEMAIL FOR A CALL BACK °Letter sent °

## 2021-05-20 NOTE — ED Provider Triage Note (Signed)
Emergency Medicine Provider Triage Evaluation Note ? ?Mora Appl , a 61 y.o. female  was evaluated in triage.  Pt complains of right upper quadrant and epigastric pain.  Patient is having a lot of burping and vomiting after eating. ? ?Review of Systems  ?Positive: See above ?Negative: Fever, chills ? ?Physical Exam  ?BP (!) 147/75 (BP Location: Right Arm)   Pulse (!) 59   Temp 98.1 ?F (36.7 ?C) (Oral)   Resp 18   Ht '5\' 6"'$  (1.676 m)   Wt 99.8 kg   SpO2 98%   BMI 35.51 kg/m?  ?Gen:   Awake, no distress   ?Resp:  Normal effort  ?MSK:   Moves extremities without difficulty  ?Other:  Abdomen tender in the epigastric area ? ?Medical Decision Making  ?Medically screening exam initiated at 11:46 AM.  Appropriate orders placed.  Jonisha Kindig Shaheen was informed that the remainder of the evaluation will be completed by another provider, this initial triage assessment does not replace that evaluation, and the importance of remaining in the ED until their evaluation is complete. ? ? ?  ?Versie Starks, PA-C ?05/20/21 1147 ? ?

## 2021-05-20 NOTE — Discharge Instructions (Signed)
As we discussed, please continue to take your pantoprazole/Protonix medication for your acid reflux. ? ?Please reach out to the clinic of Dr. Vicente Males to discuss being seen in the clinic.  He is one of our local gastrointestinal/GI doctors. ? ?If you develop any severely worsening pain, pain with fevers or blood in your vomit or stool, then please return to the ED. ?

## 2021-05-20 NOTE — ED Triage Notes (Signed)
Pt via POV from home. Pt c/o RUQ pain, states every time she eats it hurts. Pt said 3 months ago it started but it is getting worse. Denies any rectal bleeding. Pt has a hx of acid reflux. Pt tried OTC medication no relief. Pt is A&OX4 and NAD ?

## 2021-05-23 DIAGNOSIS — Z79899 Other long term (current) drug therapy: Secondary | ICD-10-CM | POA: Diagnosis not present

## 2021-05-28 ENCOUNTER — Other Ambulatory Visit: Payer: Self-pay

## 2021-05-28 ENCOUNTER — Ambulatory Visit (INDEPENDENT_AMBULATORY_CARE_PROVIDER_SITE_OTHER): Payer: Medicare HMO | Admitting: Gastroenterology

## 2021-05-28 ENCOUNTER — Encounter: Payer: Self-pay | Admitting: Gastroenterology

## 2021-05-28 VITALS — BP 114/75 | HR 77 | Temp 98.6°F | Ht 66.0 in | Wt 220.4 lb

## 2021-05-28 DIAGNOSIS — R1013 Epigastric pain: Secondary | ICD-10-CM

## 2021-05-28 DIAGNOSIS — G8929 Other chronic pain: Secondary | ICD-10-CM

## 2021-05-28 DIAGNOSIS — Z1211 Encounter for screening for malignant neoplasm of colon: Secondary | ICD-10-CM

## 2021-05-28 MED ORDER — CLENPIQ 10-3.5-12 MG-GM -GM/160ML PO SOLN: 320.0000 mL | Freq: Once | ORAL | 0 refills | Status: AC

## 2021-05-28 NOTE — Progress Notes (Signed)
?  ?Cephas Darby, MD ?[address]  ?[address]  ?[place], [place] [address]  ?Main: [phone]  ?Fax: [phone] ? ? ? ?Gastroenterology Consultation ? ?Referring Provider:     Lorelee Market, MD ?Primary Care Physician:  Lorelee Market, MD ?Primary Gastroenterologist:  Dr. Cephas Darby ?Reason for Consultation:   Upper abdominal pain ?      ? HPI:   ?Vanessa Fisher is a 61 y.o. female referred by Dr. Lorelee Market, MD  for consultation & management of chronic intermittent upper abdominal pain, history of chronic GERD ? ?Patient went to the ER in mid March 2023 secondary to left-sided abdominal pain associated with belching and sour taste in her mouth.  She underwent CT abdomen and pelvis with contrast, found to have acute uncomplicated descending Diverticulitis.  She was treated with Cipro and Flagyl.  Patient presented to ER again on 05/20/2021 secondary to right upper quadrant pain which is worse postprandial, however that every time she eats which has been ongoing intermittently for last 3 months and progressively worsening, history of acid reflux, underwent right upper quadrant ultrasound which revealed fatty liver and no evidence of cholelithiasis or cholecystitis.  Serum lipase, CMP, CBC were unremarkable.  Abdominal x-ray was normal.  She was treated with IV Reglan in the ER. ? ? ?NSAIDs: None ? ?Antiplts/Anticoagulants/Anti thrombotics: None ? ?GI Procedures: None ? ?Past Medical History:  ?Diagnosis Date  ? Anemia   ? Anxiety   ? Arthritis   ? Asthma   ? WELL CONTROLLED  ? Bile acid malabsorption syndrome   ? Bradycardia   ? HAD AN ISSUE WITH THIS IN 2016-NO PROBLEMS SINCE  ? Depression   ? Diabetes mellitus without complication ([place])   ? Diverticulitis   ? Gastric reflux   ? GERD (gastroesophageal reflux disease)   ? Hand pain 05/12/2016  ? Headache   ? H/O MIGRAINES  ? History of kidney stones   ? H/O  ? Neck pain, chronic   ? Panic attacks   ? ? ?Past Surgical History:   ?Procedure Laterality Date  ? CARPAL TUNNEL RELEASE Right 12/06/2017  ? Procedure: CARPAL TUNNEL RELEASE;  Surgeon: Earnestine Leys, MD;  Location: [facility] ORS;  Service: Orthopedics;  Laterality: Right;  ? DORSAL COMPARTMENT RELEASE Right 12/06/2017  ? Procedure: RELEASE DORSAL COMPARTMENT ([place]);  Surgeon: Earnestine Leys, MD;  Location: [facility] ORS;  Service: Orthopedics;  Laterality: Right;  ? PARTIAL HYSTERECTOMY    ? TUBAL LIGATION    ? ?Current Outpatient Medications:  ?  albuterol (PROVENTIL) (2.5 MG/3ML) 0.083% nebulizer solution, Take 3 mLs (2.5 mg total) by nebulization every 4 (four) hours as needed for wheezing or shortness of breath., Disp: 75 mL, Rfl: 2 ?  albuterol (VENTOLIN HFA) 108 (90 Base) MCG/ACT inhaler, Inhale 2 puffs into the lungs every 6 (six) hours as needed for wheezing or shortness of breath., Disp: 18 g, Rfl: 0 ?  aspirin 81 MG chewable tablet, Chew 81 mg by mouth daily., Disp: , Rfl:  ?  cetirizine-pseudoephedrine (ZYRTEC-D) 5-120 MG tablet, Take 1 tablet by mouth daily., Disp: 30 tablet, Rfl: 0 ?  DULoxetine (CYMBALTA) 20 MG capsule, Take 1 capsule by mouth daily., Disp: , Rfl:  ?  FLOVENT DISKUS 250 MCG/ACT AEPB, Inhale into the lungs., Disp: , Rfl:  ?  fluticasone (FLONASE) 50 MCG/ACT nasal spray, Place 2 sprays into both nostrils daily., Disp: 15.8 mL, Rfl: 0 ?  glimepiride (AMARYL) 2 MG tablet, Take 2 mg by mouth  daily., Disp: , Rfl: 2 ?  lisinopril-hydrochlorothiazide (ZESTORETIC) 10-12.5 MG tablet, Take 1 tablet by mouth daily., Disp: , Rfl:  ?  montelukast (SINGULAIR) 10 MG tablet, Take by mouth., Disp: , Rfl:  ?  naloxone (NARCAN) nasal spray 4 mg/0.1 mL, SMARTSIG:Both Nares, Disp: , Rfl:  ?  ondansetron (ZOFRAN-ODT) 8 MG disintegrating tablet, Take 1 tablet (8 mg total) by mouth every 8 (eight) hours as needed for nausea or vomiting., Disp: 12 tablet, Rfl: 0 ?  oxyCODONE-acetaminophen (PERCOCET) 10-325 MG tablet, Take 1 tablet by mouth 3 (three) times daily as needed., Disp: ,  Rfl:  ?  phenazopyridine (PYRIDIUM) 200 MG tablet, Take 1 tablet (200 mg total) by mouth 3 (three) times daily., Disp: 6 tablet, Rfl: 0 ?  triamcinolone cream (KENALOG) 0.1 %, Apply 1 application topically 2 (two) times daily as needed., Disp: 30 g, Rfl: 0 ?  TRULICITY 3 UK/0.2RK SOPN, SMARTSIG:3 Milligram(s) SUB-Q Once a Week, Disp: , Rfl:  ? ? ? ?Family History  ?Problem Relation Age of Onset  ? Diabetes Mother   ? Hypertension Mother   ? Heart failure Mother   ? Cancer Mother   ? Diabetes Father   ? Hypertension Father   ?  ? ?Social History  ? ?Tobacco Use  ? Smoking status: Never  ? Smokeless tobacco: Never  ?Vaping Use  ? Vaping Use: Never used  ?Substance Use Topics  ? Alcohol use: No  ? Drug use: No  ? ? ?Allergies as of 05/28/2021 - Review Complete 05/28/2021  ?Allergen Reaction Noted  ? Bc powder [aspirin-salicylamide-caffeine] Other (See Comments) 05/16/2014  ? Gabapentin Itching 06/24/2018  ? Hydrocodone-acetaminophen Other (See Comments) 05/16/2014  ? Latex Itching 11/29/2017  ? Penicillin g Other (See Comments) 05/16/2014  ? Penicillins Other (See Comments) 12/13/2013  ? Shrimp [shellfish allergy] Swelling 11/29/2017  ? ? ?Review of Systems:    ?All systems reviewed and negative except where noted in HPI. ? ? Physical Exam:  ?BP 114/75 (BP Location: Left Arm, Patient Position: Sitting, Cuff Size: Normal)   Pulse 77   Temp 98.6 ?F (37 ?C) (Oral)   Ht '5\' 6"'$  (1.676 m)   Wt 220 lb 6 oz (100 kg)   BMI 35.57 kg/m?  ?No LMP recorded. Patient has had a hysterectomy. ? ?General:   Alert,  Well-developed, well-nourished, pleasant and cooperative in NAD ?Head:  Normocephalic and atraumatic. ?Eyes:  Sclera clear, no icterus.   Conjunctiva pink. ?Ears:  Normal auditory acuity. ?Nose:  No deformity, discharge, or lesions. ?Mouth:  No deformity or lesions,oropharynx pink & moist. ?Neck:  Supple; no masses or thyromegaly. ?Lungs:  Respirations even and unlabored.  Clear throughout to auscultation.   No wheezes,  crackles, or rhonchi. No acute distress. ?Heart:  Regular rate and rhythm; no murmurs, clicks, rubs, or gallops. ?Abdomen:  Normal bowel sounds. Soft, non-tender and non-distended without masses, hepatosplenomegaly or hernias noted.  No guarding or rebound tenderness.   ?Rectal: Not performed ?Msk:  Symmetrical without gross deformities. Good, equal movement & strength bilaterally. ?Pulses:  Normal pulses noted. ?Extremities:  No clubbing or edema.  No cyanosis. ?Neurologic:  Alert and oriented x3;  grossly normal neurologically. ?Skin:  Intact without significant lesions or rashes. No jaundice. ?Psych:  Alert and cooperative. Normal mood and affect. ? ?Imaging Studies: ?Reviewed ? ?Assessment and Plan:  ? ?Vanessa Fisher is a 61 y.o. female with obesity, metabolic syndrome is seen in consultation for chronic symptoms of dyspepsia ? ?Recommend EGD for further  evaluation ?If unremarkable, recommend gastric emptying study ? ?Colon cancer screening ?Schedule screening colonoscopy ? ?I have discussed alternative options, risks & benefits,  which include, but are not limited to, bleeding, infection, perforation,respiratory complication & drug reaction.  The patient agrees with this plan & written consent will be obtained.   ? ? ?Follow up based on the above work-up ? ? ?Cephas Darby, MD ? ?

## 2021-05-30 ENCOUNTER — Telehealth: Payer: Self-pay | Admitting: *Deleted

## 2021-06-02 NOTE — Telephone Encounter (Signed)
Chart entered in error

## 2021-06-11 ENCOUNTER — Ambulatory Visit
Admission: RE | Admit: 2021-06-11 | Discharge: 2021-06-11 | Disposition: A | Discharge status: Home / Self Care | Payer: Medicare HMO | Attending: Gastroenterology | Admitting: Gastroenterology

## 2021-06-11 ENCOUNTER — Ambulatory Visit: Payer: Medicare HMO | Admitting: Certified Registered"

## 2021-06-11 ENCOUNTER — Encounter
Admission: RE | Disposition: A | Discharge status: Home / Self Care | Payer: Self-pay | Source: Home / Self Care | Attending: Gastroenterology

## 2021-06-11 DIAGNOSIS — Z7985 Long-term (current) use of injectable non-insulin antidiabetic drugs: Secondary | ICD-10-CM | POA: Insufficient documentation

## 2021-06-11 DIAGNOSIS — J45909 Unspecified asthma, uncomplicated: Secondary | ICD-10-CM | POA: Diagnosis not present

## 2021-06-11 DIAGNOSIS — E119 Type 2 diabetes mellitus without complications: Secondary | ICD-10-CM | POA: Diagnosis not present

## 2021-06-11 DIAGNOSIS — F32A Depression, unspecified: Secondary | ICD-10-CM | POA: Diagnosis not present

## 2021-06-11 DIAGNOSIS — Z7984 Long term (current) use of oral hypoglycemic drugs: Secondary | ICD-10-CM | POA: Insufficient documentation

## 2021-06-11 DIAGNOSIS — K449 Diaphragmatic hernia without obstruction or gangrene: Secondary | ICD-10-CM | POA: Insufficient documentation

## 2021-06-11 DIAGNOSIS — F410 Panic disorder [episodic paroxysmal anxiety] without agoraphobia: Secondary | ICD-10-CM | POA: Insufficient documentation

## 2021-06-11 DIAGNOSIS — R1013 Epigastric pain: Secondary | ICD-10-CM | POA: Diagnosis not present

## 2021-06-11 DIAGNOSIS — K5730 Diverticulosis of large intestine without perforation or abscess without bleeding: Secondary | ICD-10-CM | POA: Diagnosis not present

## 2021-06-11 DIAGNOSIS — Z1211 Encounter for screening for malignant neoplasm of colon: Secondary | ICD-10-CM | POA: Diagnosis not present

## 2021-06-11 HISTORY — PX: COLONOSCOPY WITH PROPOFOL: SHX5780

## 2021-06-11 HISTORY — PX: ESOPHAGOGASTRODUODENOSCOPY (EGD) WITH PROPOFOL: SHX5813

## 2021-06-11 LAB — GLUCOSE, CAPILLARY: Glucose-Capillary: 93 mg/dL (ref 70–99)

## 2021-06-11 SURGERY — COLONOSCOPY WITH PROPOFOL
Anesthesia: General

## 2021-06-11 MED ORDER — PROPOFOL 10 MG/ML IV BOLUS
INTRAVENOUS | Status: DC | PRN
  Administered 2021-06-11 (×2): 50 mg via INTRAVENOUS
  Administered 2021-06-11: 80 mg via INTRAVENOUS

## 2021-06-11 MED ORDER — DEXMEDETOMIDINE (PRECEDEX) IN NS 20 MCG/5ML (4 MCG/ML) IV SYRINGE
PREFILLED_SYRINGE | INTRAVENOUS | Status: DC | PRN
  Administered 2021-06-11: 12 ug via INTRAVENOUS

## 2021-06-11 MED ORDER — PROPOFOL 500 MG/50ML IV EMUL
INTRAVENOUS | Status: DC | PRN
  Administered 2021-06-11: 140 ug/kg/min via INTRAVENOUS

## 2021-06-11 MED ORDER — SODIUM CHLORIDE 0.9 % IV SOLN
INTRAVENOUS | Status: DC
  Administered 2021-06-11: 20 mL/h via INTRAVENOUS

## 2021-06-11 MED ORDER — GLYCOPYRROLATE 0.2 MG/ML IJ SOLN
INTRAMUSCULAR | Status: DC | PRN
  Administered 2021-06-11: .2 mg via INTRAVENOUS

## 2021-06-11 MED ORDER — ALBUTEROL SULFATE HFA 108 (90 BASE) MCG/ACT IN AERS
2.0000 | INHALATION_SPRAY | RESPIRATORY_TRACT | Status: AC
  Administered 2021-06-11: 2 via RESPIRATORY_TRACT

## 2021-06-11 NOTE — Anesthesia Preprocedure Evaluation (Signed)
Anesthesia Evaluation  ?Patient identified by MRN, date of birth, ID band ?Patient awake ? ? ? ?Reviewed: ?Allergy & Precautions, NPO status , Patient's Chart, lab work & pertinent test results ? ?History of Anesthesia Complications ?Negative for: history of anesthetic complications ? ?Airway ?Mallampati: II ? ?TM Distance: >3 FB ?Neck ROM: Full ? ? ? Dental ? ?(+) Upper Dentures, Lower Dentures ?  ?Pulmonary ?asthma , neg sleep apnea, neg COPD, Patient abstained from smoking.Not current smoker,  ?  ?Pulmonary exam normal ?breath sounds clear to auscultation ? ? ? ? ? ? Cardiovascular ?Exercise Tolerance: Good ?METS(-) hypertension(-) CAD and (-) Past MI negative cardio ROS ? ?(-) dysrhythmias  ?Rhythm:Regular Rate:Normal ?- Systolic murmurs ? ?  ?Neuro/Psych ? Headaches, PSYCHIATRIC DISORDERS Anxiety Depression negative neurological ROS ?   ? GI/Hepatic ?GERD  ,(+)  ?  ? (-) substance abuse ? ,   ?Endo/Other  ?diabetes ? Renal/GU ?negative Renal ROS  ? ?  ?Musculoskeletal ? ?(+) Arthritis ,  ? Abdominal ?  ?Peds ? Hematology ?  ?Anesthesia Other Findings ?Past Medical History: ?No date: Anemia ?No date: Anxiety ?No date: Arthritis ?No date: Asthma ?    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    Comment:  HAD AN ISSUE WITH THIS IN 2016-NO PROBLEMS SINCE ?No date: Depression ?No date: Diabetes mellitus without complication ([place]) ?No date: Diverticulitis ?No date: Gastric reflux ?No date: GERD (gastroesophageal reflux disease) ?05/12/2016: Hand pain ?No date: Headache ?    Comment:  H/O MIGRAINES ?No date: History of kidney stones ?    Comment:  H/O ?No date: Neck pain, chronic ?No date: Panic attacks ? Reproductive/Obstetrics ? ?  ? ? ? ? ? ? ? ? ? ? ? ? ? ?  ?  ? ? ? ? ? ? ? ? ?Anesthesia Physical ?Anesthesia Plan ? ?ASA: 2 ? ?Anesthesia Plan: General  ? ?Post-op Pain Management: Minimal or no pain anticipated  ? ?Induction:  Intravenous ? ?PONV Risk Score and Plan: 3 and Propofol infusion, TIVA and Ondansetron ? ?Airway Management Planned: Nasal Cannula ? ?Additional Equipment: None ? ?Intra-op Plan:  ? ?Post-operative Plan:  ? ?Informed Consent: I have reviewed the patients History and Physical, chart, labs and discussed the procedure including the risks, benefits and alternatives for the proposed anesthesia with the patient or authorized representative who has indicated his/her understanding and acceptance.  ? ? ? ?Dental advisory given ? ?Plan Discussed with: CRNA and Surgeon ? ?Anesthesia Plan Comments: (Discussed risks of anesthesia with patient, including possibility of difficulty with spontaneous ventilation under anesthesia necessitating airway intervention, PONV, and rare risks such as cardiac or respiratory or neurological events, and allergic reactions. Discussed the role of CRNA in patient's perioperative care. Patient understands.)  ? ? ? ? ? ? ?Anesthesia Quick Evaluation ? ?

## 2021-06-11 NOTE — Transfer of Care (Signed)
Immediate Anesthesia Transfer of Care Note ? ?Patient: Vanessa Fisher ? ?Procedure(s) Performed: COLONOSCOPY WITH PROPOFOL ?ESOPHAGOGASTRODUODENOSCOPY (EGD) WITH PROPOFOL ? ?Patient Location: PACU and Endoscopy Unit ? ?Anesthesia Type:General ? ?Level of Consciousness: drowsy ? ?Airway & Oxygen Therapy: Patient Spontanous Breathing ? ?Post-op Assessment: Report given to RN ? ?Post vital signs: stable ? ?Last Vitals:  ?Vitals Value Taken Time  ?BP 89/53 06/11/21 1108  ?Temp    ?Pulse 104 06/11/21 1109  ?Resp 19 06/11/21 1109  ?SpO2 99 % 06/11/21 1109  ?Vitals shown include unvalidated device data. ? ?Last Pain:  ?Vitals:  ? 06/11/21 0953  ?TempSrc: Temporal  ?PainSc: 0-No pain  ?   ? ?  ? ?Complications: No notable events documented. ?

## 2021-06-11 NOTE — Anesthesia Postprocedure Evaluation (Signed)
Anesthesia Post Note ? ?Patient: GAYNELLE PASTRANA ? ?Procedure(s) Performed: COLONOSCOPY WITH PROPOFOL ?ESOPHAGOGASTRODUODENOSCOPY (EGD) WITH PROPOFOL ? ?Patient location during evaluation: Endoscopy ?Anesthesia Type: General ?Level of consciousness: awake and alert ?Pain management: pain level controlled ?Vital Signs Assessment: post-procedure vital signs reviewed and stable ?Respiratory status: spontaneous breathing, nonlabored ventilation, respiratory function stable and patient connected to nasal cannula oxygen ?Cardiovascular status: blood pressure returned to baseline and stable ?Postop Assessment: no apparent nausea or vomiting ?Anesthetic complications: no ? ? ?No notable events documented. ? ? ?Last Vitals:  ?Vitals:  ? 06/11/21 1110 06/11/21 1120  ?BP: (!) 97/54 104/60  ?Pulse: (!) 108 82  ?Resp: 20 17  ?Temp: 36.5 ?C   ?SpO2: 99% 100%  ?  ?Last Pain:  ?Vitals:  ? 06/11/21 1100  ?TempSrc: Temporal  ?PainSc:   ? ? ?  ?  ?  ?  ?  ?  ? ?Arita Miss ? ? ? ? ?

## 2021-06-11 NOTE — H&P (Signed)
?Cephas Darby, MD ?[address]  ?[address]  ?[place], [place] [address]  ?Main: [phone]  ?Fax: [phone] ?Pager: [phone] ? ?Primary Care Physician:  Lorelee Market, MD ?Primary Gastroenterologist:  Dr. Cephas Darby ? ?Pre-Procedure History & Physical: ?HPI:  Vanessa Fisher is a 61 y.o. female is here for an endoscopy and colonoscopy. ?  ?Past Medical History:  ?Diagnosis Date  ? Anemia   ? Anxiety   ? Arthritis   ? Asthma   ? WELL CONTROLLED  ? Bile acid malabsorption syndrome   ? Bradycardia   ? HAD AN ISSUE WITH THIS IN 2016-NO PROBLEMS SINCE  ? Depression   ? Diabetes mellitus without complication ([place])   ? Diverticulitis   ? Gastric reflux   ? GERD (gastroesophageal reflux disease)   ? Hand pain 05/12/2016  ? Headache   ? H/O MIGRAINES  ? History of kidney stones   ? H/O  ? Neck pain, chronic   ? Panic attacks   ? ? ?Past Surgical History:  ?Procedure Laterality Date  ? CARPAL TUNNEL RELEASE Right 12/06/2017  ? Procedure: CARPAL TUNNEL RELEASE;  Surgeon: Earnestine Leys, MD;  Location: [facility] ORS;  Service: Orthopedics;  Laterality: Right;  ? DORSAL COMPARTMENT RELEASE Right 12/06/2017  ? Procedure: RELEASE DORSAL COMPARTMENT (DEQUERVAIN);  Surgeon: Earnestine Leys, MD;  Location: [facility] ORS;  Service: Orthopedics;  Laterality: Right;  ? PARTIAL HYSTERECTOMY    ? TUBAL LIGATION    ? ? ?Prior to Admission medications   ?Medication Sig Start Date End Date Taking? Authorizing Provider  ?albuterol (PROVENTIL) (2.5 MG/3ML) 0.083% nebulizer solution Take 3 mLs (2.5 mg total) by nebulization every 4 (four) hours as needed for wheezing or shortness of breath. 10/09/20 10/09/21  Fisher, Linden Dolin, PA-C  ?albuterol (VENTOLIN HFA) 108 (90 Base) MCG/ACT inhaler Inhale 2 puffs into the lungs every 6 (six) hours as needed for wheezing or shortness of breath. 10/06/20   Rodriguez-Southworth, Sunday Spillers, PA-C  ?aspirin 81 MG chewable tablet Chew 81 mg by mouth daily. 01/24/21   [provider]   ?cetirizine-pseudoephedrine (ZYRTEC-D) 5-120 MG tablet Take 1 tablet by mouth daily. 01/18/20   Rodriguez-Southworth, Sunday Spillers, PA-C  ?DULoxetine (CYMBALTA) 20 MG capsule Take 1 capsule by mouth daily. 11/12/17   [provider]  ?Verlin Fester 250 MCG/ACT AEPB Inhale into the lungs. 05/19/21   [provider]  ?fluticasone (FLONASE) 50 MCG/ACT nasal spray Place 2 sprays into both nostrils daily. 05/04/20   Verda Cumins, MD  ?glimepiride (AMARYL) 2 MG tablet Take 2 mg by mouth daily. 07/03/17   [provider]  ?lisinopril-hydrochlorothiazide (ZESTORETIC) 10-12.5 MG tablet Take 1 tablet by mouth daily. 04/04/21   [provider]  ?montelukast (SINGULAIR) 10 MG tablet Take by mouth. 02/10/21   [provider]  ?naloxone Karma Greaser) nasal spray 4 mg/0.1 mL SMARTSIG:Both Nares 02/09/21   [provider]  ?ondansetron (ZOFRAN-ODT) 8 MG disintegrating tablet Take 1 tablet (8 mg total) by mouth every 8 (eight) hours as needed for nausea or vomiting. 04/17/21   Arta Silence, MD  ?oxyCODONE-acetaminophen (PERCOCET) 10-325 MG tablet Take 1 tablet by mouth 3 (three) times daily as needed. 05/12/19   [provider]  ?phenazopyridine (PYRIDIUM) 200 MG tablet Take 1 tablet (200 mg total) by mouth 3 (three) times daily. 05/04/20   Verda Cumins, MD  ?triamcinolone cream (KENALOG) 0.1 % Apply 1 application topically 2 (two) times daily as needed. 08/09/18   Karen Kitchens, NP  ?TRULICITY 3 BU/3.8GT  SOPN SMARTSIG:3 Milligram(s) SUB-Q Once a Week 05/05/21   [provider]  ?atorvastatin (LIPITOR) 10 MG tablet Take 10 mg by mouth at bedtime.   11/06/18  [provider]  ?gabapentin (NEURONTIN) 400 MG capsule Take 1 capsule (400 mg total) by mouth 2 (two) times daily. 12/06/17 07/27/18  Earnestine Leys, MD  ? ? ?Allergies as of 05/28/2021 - Review Complete 05/28/2021  ?Allergen Reaction Noted  ? Bc powder [aspirin-salicylamide-caffeine] Other (See Comments) 05/16/2014   ? Gabapentin Itching 06/24/2018  ? Hydrocodone-acetaminophen Other (See Comments) 05/16/2014  ? Latex Itching 11/29/2017  ? Penicillin g Other (See Comments) 05/16/2014  ? Penicillins Other (See Comments) 12/13/2013  ? Shrimp [shellfish allergy] Swelling 11/29/2017  ? ? ?Family History  ?Problem Relation Age of Onset  ? Diabetes Mother   ? Hypertension Mother   ? Heart failure Mother   ? Cancer Mother   ? Diabetes Father   ? Hypertension Father   ? ? ?Social History  ? ?Socioeconomic History  ? Marital status: Single  ?  Spouse name: Not on file  ? Number of children: Not on file  ? Years of education: Not on file  ? Highest education level: Not on file  ?Occupational History  ? Not on file  ?Tobacco Use  ? Smoking status: Never  ? Smokeless tobacco: Never  ?Vaping Use  ? Vaping Use: Never used  ?Substance and Sexual Activity  ? Alcohol use: No  ? Drug use: No  ? Sexual activity: Not on file  ?Other Topics Concern  ? Not on file  ?Social History Narrative  ? Not on file  ? ?Social Determinants of Health  ? ?Financial Resource Strain: Not on file  ?Food Insecurity: Not on file  ?Transportation Needs: Not on file  ?Physical Activity: Not on file  ?Stress: Not on file  ?Social Connections: Not on file  ?Intimate Partner Violence: Not on file  ? ? ?Review of Systems: ?See HPI, otherwise negative ROS ? ?Physical Exam: ?BP 109/65   Pulse 81   Temp 97.7 ?F (36.5 ?C) (Temporal)   Resp 20   Ht '5\' 6"'$  (1.676 m)   Wt 99.8 kg   SpO2 100%   BMI 35.51 kg/m?  ?General:   Alert,  pleasant and cooperative in NAD ?Head:  Normocephalic and atraumatic. ?Neck:  Supple; no masses or thyromegaly. ?Lungs:  Clear throughout to auscultation.    ?Heart:  Regular rate and rhythm. ?Abdomen:  Soft, nontender and nondistended. Normal bowel sounds, without guarding, and without rebound.   ?Neurologic:  Alert and  oriented x4;  grossly normal neurologically. ? ?Impression/Plan: ?Vanessa Fisher is here for an endoscopy and colonoscopy  to be performed for dyspepsia, colon cancer screening ? ?Risks, benefits, limitations, and alternatives regarding  endoscopy and colonoscopy have been reviewed with the patient.  Questions have been answered.  All parties agreeable. ? ? ?Sherri Sear, MD  06/11/2021, 10:20 AM ?

## 2021-06-11 NOTE — Op Note (Signed)
[facility] ?Gastroenterology ?Patient Name: Vanessa Fisher ?Procedure Date: 06/11/2021 10:24 AM ?MRN: [id number] ?Account #: [id number] ?Date of Birth: 18-Jan-1961 ?Admit Type: Outpatient ?Age: 61 ?Room: [facility] ENDO ROOM 4 ?Gender: Female ?Note Status: Finalized ?Instrument Name: Upper Endoscope [id number] ?Procedure:             Upper GI endoscopy ?Indications:           Epigastric abdominal pain ?Providers:             Lin Landsman MD, MD ?Medicines:             General Anesthesia ?Complications:         No immediate complications. Estimated blood loss: None. ?Procedure:             Pre-Anesthesia Assessment: ?                       - Prior to the procedure, a History and Physical was  ?                       performed, and patient medications and allergies were  ?                       reviewed. The patient is competent. The risks and  ?                       benefits of the procedure and the sedation options and  ?                       risks were discussed with the patient. All questions  ?                       were answered and informed consent was obtained.  ?                       Patient identification and proposed procedure were  ?                       verified by the physician, the nurse, the  ?                       anesthesiologist, the anesthetist and the technician  ?                       in the pre-procedure area in the procedure room in the  ?                       endoscopy suite. Mental Status Examination: alert and  ?                       oriented. Airway Examination: normal oropharyngeal  ?                       airway and neck mobility. Respiratory Examination:  ?                       clear to auscultation. CV Examination: normal.  ?                       Prophylactic Antibiotics:  The patient does not require  ?                       prophylactic antibiotics. Prior Anticoagulants: The  ?                       patient has taken no previous anticoagulant or  ?                        antiplatelet agents. ASA Grade Assessment: III - A  ?                       patient with severe systemic disease. After reviewing  ?                       the risks and benefits, the patient was deemed in  ?                       satisfactory condition to undergo the procedure. The  ?                       anesthesia plan was to use general anesthesia.  ?                       Immediately prior to administration of medications,  ?                       the patient was re-assessed for adequacy to receive  ?                       sedatives. The heart rate, respiratory rate, oxygen  ?                       saturations, blood pressure, adequacy of pulmonary  ?                       ventilation, and response to care were monitored  ?                       throughout the procedure. The physical status of the  ?                       patient was re-assessed after the procedure. ?                       After obtaining informed consent, the endoscope was  ?                       passed under direct vision. Throughout the procedure,  ?                       the patient's blood pressure, pulse, and oxygen  ?                       saturations were monitored continuously. The Endoscope  ?                       was introduced through the mouth, and advanced to the  ?  second part of duodenum. The upper GI endoscopy was  ?                       accomplished without difficulty. The patient tolerated  ?                       the procedure well. ?Findings: ?     The duodenal bulb and second portion of the duodenum were normal. ?     A 2 cm hiatal hernia was present. ?     The entire examined stomach was normal. Biopsies were taken with a cold  ?     forceps for Helicobacter pylori testing. ?     The cardia and gastric fundus were normal on retroflexion. ?     Esophagogastric landmarks were identified: the gastroesophageal junction  ?     was found at 33 cm from the incisors. ?     The gastroesophageal  junction and examined esophagus were normal. ?Impression:            - Normal duodenal bulb and second portion of the  ?                       duodenum. ?                       - 2 cm hiatal hernia. ?                       - Normal stomach. Biopsied. ?                       - Esophagogastric landmarks identified. ?                       - Normal gastroesophageal junction and esophagus. ?Recommendation:        - Await pathology results. ?                       - Follow an antireflux regimen indefinitely. ?                       - Proceed with colonoscopy as scheduled ?                       See colonoscopy report ?Procedure Code(s):     --- Professional --- ?                       [id number], Esophagogastroduodenoscopy, flexible,  ?                       transoral; with biopsy, single or multiple ?Diagnosis Code(s):     --- Professional --- ?                       K44.9, Diaphragmatic hernia without obstruction or  ?                       gangrene ?                       R10.13, Epigastric pain ?CPT copyright 2019 American Medical Association. All rights reserved. ?The codes documented in this report are  preliminary and upon coder review may  ?be revised to meet current compliance requirements. ?Dr. Ulyess Mort ?Alcario Tinkey Raeanne Gathers MD, MD ?06/11/2021 10:52:06 AM ?This report has been signed electronically. ?Number of Addenda: 0 ?Note Initiated On: 06/11/2021 10:24 AM ?Estimated Blood Loss:  Estimated blood loss: none. ?     [facility] ?

## 2021-06-11 NOTE — Op Note (Signed)
[facility] ?Gastroenterology ?Patient Name: Vanessa Fisher ?Procedure Date: 06/11/2021 10:24 AM ?MRN: [id number] ?Account #: [id number] ?Date of Birth: 1960-10-15 ?Admit Type: Outpatient ?Age: 60 ?Room: [facility] ENDO ROOM 4 ?Gender: Female ?Note Status: Finalized ?Instrument Name: Colonscope [id number] ?Procedure:             Colonoscopy ?Indications:           Screening for colorectal malignant neoplasm ?Providers:             Lin Landsman MD, MD ?Medicines:             General Anesthesia ?Complications:         No immediate complications. Estimated blood loss: None. ?Procedure:             Pre-Anesthesia Assessment: ?                       - Prior to the procedure, a History and Physical was  ?                       performed, and patient medications and allergies were  ?                       reviewed. The patient is competent. The risks and  ?                       benefits of the procedure and the sedation options and  ?                       risks were discussed with the patient. All questions  ?                       were answered and informed consent was obtained.  ?                       Patient identification and proposed procedure were  ?                       verified by the physician, the nurse, the  ?                       anesthesiologist, the anesthetist and the technician  ?                       in the pre-procedure area in the procedure room in the  ?                       endoscopy suite. Mental Status Examination: alert and  ?                       oriented. Airway Examination: normal oropharyngeal  ?                       airway and neck mobility. Respiratory Examination:  ?                       clear to auscultation. CV Examination: normal.  ?                       Prophylactic Antibiotics: The  patient does not require  ?                       prophylactic antibiotics. Prior Anticoagulants: The  ?                       patient has taken no previous anticoagulant or  ?                        antiplatelet agents. ASA Grade Assessment: III - A  ?                       patient with severe systemic disease. After reviewing  ?                       the risks and benefits, the patient was deemed in  ?                       satisfactory condition to undergo the procedure. The  ?                       anesthesia plan was to use general anesthesia.  ?                       Immediately prior to administration of medications,  ?                       the patient was re-assessed for adequacy to receive  ?                       sedatives. The heart rate, respiratory rate, oxygen  ?                       saturations, blood pressure, adequacy of pulmonary  ?                       ventilation, and response to care were monitored  ?                       throughout the procedure. The physical status of the  ?                       patient was re-assessed after the procedure. ?                       After obtaining informed consent, the colonoscope was  ?                       passed under direct vision. Throughout the procedure,  ?                       the patient's blood pressure, pulse, and oxygen  ?                       saturations were monitored continuously. The  ?                       Colonoscope was introduced through the anus and  ?  advanced to the the cecum, identified by appendiceal  ?                       orifice and ileocecal valve. The colonoscopy was  ?                       performed without difficulty. The patient tolerated  ?                       the procedure well. The quality of the bowel  ?                       preparation was adequate to identify polyps 6 mm and  ?                       larger in size. ?Findings: ?     The perianal and digital rectal examinations were normal. Pertinent  ?     negatives include normal sphincter tone and no palpable rectal lesions. ?     Multiple small and large-mouthed diverticula were found in the entire  ?     colon.  There was no evidence of diverticular bleeding. ?     The retroflexed view of the distal rectum and anal verge was normal and  ?     showed no anal or rectal abnormalities. ?Impression:            - Severe diverticulosis in the entire examined colon.  ?                       There was no evidence of diverticular bleeding. ?                       - The distal rectum and anal verge are normal on  ?                       retroflexion view. ?                       - No specimens collected. ?Recommendation:        - Discharge patient to home (with escort). ?                       - Resume previous diet today. ?                       - Continue present medications. ?                       - Repeat colonoscopy in 10 years for screening  ?                       purposes. ?Procedure Code(s):     --- Professional --- ?                       Z6109, Colorectal cancer screening; colonoscopy on  ?                       individual not meeting criteria for high risk ?Diagnosis Code(s):     --- Professional --- ?  Z12.11, Encounter for screening for malignant neoplasm  ?                       of colon ?                       K57.30, Diverticulosis of large intestine without  ?                       perforation or abscess without bleeding ?CPT copyright 2019 American Medical Association. All rights reserved. ?The codes documented in this report are preliminary and upon coder review may  ?be revised to meet current compliance requirements. ?Dr. Ulyess Mort ?Carlus Stay Raeanne Gathers MD, MD ?06/11/2021 11:06:40 AM ?This report has been signed electronically. ?Number of Addenda: 0 ?Note Initiated On: 06/11/2021 10:24 AM ?Scope Withdrawal Time: 0 hours 8 minutes 39 seconds  ?Total Procedure Duration: 0 hours 10 minutes 47 seconds  ?Estimated Blood Loss:  Estimated blood loss: none. ?     [facility] ?

## 2021-06-13 ENCOUNTER — Encounter: Payer: Self-pay | Admitting: Gastroenterology

## 2021-06-14 DIAGNOSIS — Z79899 Other long term (current) drug therapy: Secondary | ICD-10-CM | POA: Diagnosis not present

## 2021-06-14 DIAGNOSIS — M545 Low back pain, unspecified: Secondary | ICD-10-CM | POA: Diagnosis not present

## 2021-06-14 DIAGNOSIS — Z9181 History of falling: Secondary | ICD-10-CM | POA: Diagnosis not present

## 2021-06-14 DIAGNOSIS — Z6839 Body mass index (BMI) 39.0-39.9, adult: Secondary | ICD-10-CM | POA: Diagnosis not present

## 2021-06-14 DIAGNOSIS — G8929 Other chronic pain: Secondary | ICD-10-CM | POA: Diagnosis not present

## 2021-06-15 LAB — SURGICAL PATHOLOGY

## 2021-06-16 ENCOUNTER — Telehealth: Payer: Self-pay

## 2021-06-16 DIAGNOSIS — Z8639 Personal history of other endocrine, nutritional and metabolic disease: Secondary | ICD-10-CM

## 2021-06-16 DIAGNOSIS — H5789 Other specified disorders of eye and adnexa: Secondary | ICD-10-CM | POA: Diagnosis not present

## 2021-06-16 DIAGNOSIS — Z0189 Encounter for other specified special examinations: Secondary | ICD-10-CM

## 2021-06-16 DIAGNOSIS — R1013 Epigastric pain: Secondary | ICD-10-CM

## 2021-06-16 NOTE — Telephone Encounter (Signed)
Patient called and states that the appointment date and time is good for the gastric emptying study.  ?

## 2021-06-16 NOTE — Telephone Encounter (Signed)
Patient verbalized understanding of understanding of instructions. It is okay to schedule gastric emptying study. Called central scheduling and got patient schedule  07/04/2021 at 9:00am. Nothing to eat or drink after midnight and no GI medication after midnight. No Zofran medication. Called patient and patient verbalized understanding of instructions  ?

## 2021-06-16 NOTE — Telephone Encounter (Signed)
-----   Message from Lin Landsman, MD sent at 06/15/2021 11:25 PM EDT ----- ?Pathology results from upper endoscopy came back normal.  Therefore, I recommend solid-phase gastric emptying study ?Dx: Chronic epigastric pain, EGD normal, history of diabetes ? ?Rohini Vanga ?

## 2021-06-18 DIAGNOSIS — Z79899 Other long term (current) drug therapy: Secondary | ICD-10-CM | POA: Diagnosis not present

## 2021-07-03 DIAGNOSIS — J0111 Acute recurrent frontal sinusitis: Secondary | ICD-10-CM | POA: Diagnosis not present

## 2021-07-04 ENCOUNTER — Ambulatory Visit
Admission: RE | Admit: 2021-07-04 | Discharge: 2021-07-04 | Disposition: A | Discharge status: Home / Self Care | Payer: Medicare HMO | Source: Ambulatory Visit | Attending: Gastroenterology | Admitting: Gastroenterology

## 2021-07-04 DIAGNOSIS — R1013 Epigastric pain: Secondary | ICD-10-CM | POA: Diagnosis not present

## 2021-07-04 DIAGNOSIS — Z8639 Personal history of other endocrine, nutritional and metabolic disease: Secondary | ICD-10-CM | POA: Diagnosis not present

## 2021-07-04 DIAGNOSIS — G8929 Other chronic pain: Secondary | ICD-10-CM | POA: Diagnosis not present

## 2021-07-04 DIAGNOSIS — K30 Functional dyspepsia: Secondary | ICD-10-CM | POA: Diagnosis not present

## 2021-07-04 DIAGNOSIS — Z0189 Encounter for other specified special examinations: Secondary | ICD-10-CM | POA: Insufficient documentation

## 2021-07-04 MED ORDER — TECHNETIUM TC 99M SULFUR COLLOID
2.0000 | Freq: Once | INTRAVENOUS | Status: AC | PRN
  Administered 2021-07-04: 2.34 via ORAL

## 2021-07-08 ENCOUNTER — Telehealth: Payer: Self-pay

## 2021-07-08 MED ORDER — METOCLOPRAMIDE HCL 5 MG PO TABS: 5.0000 mg | ORAL_TABLET | Freq: Three times a day (TID) | ORAL | 0 refills | Status: DC

## 2021-07-08 NOTE — Telephone Encounter (Signed)
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 Recommend trial of Reglan 5 mg before each meal and at bedtime for 1 month.  Please ask her to pick up gastroparesis diet information and make an appointment next available for follow-up  Thanks RV

## 2021-07-08 NOTE — Telephone Encounter (Signed)
Patient verbalized understanding of results. Sent medication to the pharmacy and made follow up appointment. Informed her appointment would be in [place] and she said that is fine  She said pharmacy is [facility]

## 2021-07-14 ENCOUNTER — Other Ambulatory Visit: Payer: Self-pay

## 2021-07-14 ENCOUNTER — Emergency Department: Payer: Medicare HMO

## 2021-07-14 ENCOUNTER — Encounter: Payer: Self-pay | Admitting: Emergency Medicine

## 2021-07-14 ENCOUNTER — Emergency Department
Admission: EM | Admit: 2021-07-14 | Discharge: 2021-07-14 | Disposition: A | Discharge status: Home / Self Care | Payer: Medicare HMO | Attending: Emergency Medicine | Admitting: Emergency Medicine

## 2021-07-14 DIAGNOSIS — J45909 Unspecified asthma, uncomplicated: Secondary | ICD-10-CM | POA: Diagnosis not present

## 2021-07-14 DIAGNOSIS — E119 Type 2 diabetes mellitus without complications: Secondary | ICD-10-CM | POA: Insufficient documentation

## 2021-07-14 DIAGNOSIS — N6324 Unspecified lump in the left breast, lower inner quadrant: Secondary | ICD-10-CM | POA: Insufficient documentation

## 2021-07-14 DIAGNOSIS — N644 Mastodynia: Secondary | ICD-10-CM | POA: Diagnosis not present

## 2021-07-14 DIAGNOSIS — N6489 Other specified disorders of breast: Secondary | ICD-10-CM | POA: Diagnosis not present

## 2021-07-14 DIAGNOSIS — N6323 Unspecified lump in the left breast, lower outer quadrant: Secondary | ICD-10-CM | POA: Diagnosis not present

## 2021-07-14 DIAGNOSIS — N6310 Unspecified lump in the right breast, unspecified quadrant: Secondary | ICD-10-CM | POA: Diagnosis not present

## 2021-07-14 DIAGNOSIS — N6322 Unspecified lump in the left breast, upper inner quadrant: Secondary | ICD-10-CM | POA: Insufficient documentation

## 2021-07-14 DIAGNOSIS — N6325 Unspecified lump in the left breast, overlapping quadrants: Secondary | ICD-10-CM

## 2021-07-14 DIAGNOSIS — N6321 Unspecified lump in the left breast, upper outer quadrant: Secondary | ICD-10-CM | POA: Insufficient documentation

## 2021-07-14 NOTE — ED Notes (Signed)
See triage note  presents with seasonal allergies  states she has tried OTC meds w/o relief  is having a lot o sinus drainage  also having a "sore area to left breast"  denies any drainage or redness

## 2021-07-14 NOTE — ED Provider Notes (Signed)
[facility] Emergency Department Provider Note     Event Date/Time   First MD Initiated Contact with Patient 07/14/21 1237     (approximate)   History   Breast Pain and Allergic Reaction   HPI  Vanessa Fisher is a 61 y.o. female with a history of chronic pain syndrome, diabetes, depression, and asthma, presents to the ED for evaluation of Pain in her left breast for the last 2 days.  Patient denies any frank chest pain, shortness of breath, cough, congestion.  She reports having had a screening colonoscopy 4 weeks ago, believes she may have had an allergic reaction.  She presents with is pain due to firm palpable breast tissue on the left breast.  Patient denies any nipple discharge, nipple puckering, or other skin changes patient also denies any fevers, chills, or sweats.  Patient believes her last screening mammogram was about 1 year ago.  Physical Exam   Triage Vital Signs: ED Triage Vitals [07/14/21 1223]  Enc Vitals Group     BP 98/82     Pulse Rate 69     Resp 18     Temp 98.7 F (37.1 C)     Temp Source Oral     SpO2 99 %     Weight 220 lb 0.3 oz (99.8 kg)     Height '5\' 6"'$  (1.676 m)     Head Circumference      Peak Flow      Pain Score 0     Pain Loc      Pain Edu?      Excl. in [place]?     Most recent vital signs: Vitals:   07/14/21 1223 07/14/21 1553  BP: 98/82 108/78  Pulse: 69 70  Resp: 18 18  Temp: 98.7 F (37.1 C)   SpO2: 99% 99%    General Awake, no distress.  CV:  Good peripheral perfusion.  RESP:  Normal effort.  ABD:  No distention.  BREAST: Right breast without nipple discharge or puckering.  Some mild fibrocystic breast changes noted in all 4 quadrants.  Left breast, without any overlying skin changes, no erythema, induration, and no peau d l'orange appearance.  Patient does have slightly larger appearing left and right breast.  Left breast also with a firm palpable mass noted underlying the nipple and areola, as  well as with extension to the upper and lower outer quadrants.  No palpable fullness or tenderness in the axilla.   ED Results / Procedures / Treatments   Labs (all labs ordered are listed, but only abnormal results are displayed) Labs Reviewed - No data to display   EKG  RADIOLOGY  I personally viewed and evaluated these images as part of my medical decision making, as well as reviewing the written report by the radiologist.  ED Provider Interpretation: left breast mass}  US BREAST LTD UNI LEFT INC AXILLA  Result Date: 07/14/2021 CLINICAL DATA:  62 year old female currently in the ER with complaints of a palpable lump in her left breast. There is no associated pain. EXAM: ULTRASOUND OF THE LEFT BREAST COMPARISON:  None Available. FINDINGS: Ultrasound targeted to the palpable lump in the left breast at 3 o'clock demonstrates a large irregular mass with internal blood flow. The mass measures 5.3 x 2.4 x 4.8 cm. IMPRESSION: Targeted ultrasound images demonstrate a 5.3 cm mass in the left breast. Given the lack of infectious symptoms related to the breast, this appearance is suspicious for malignancy  and requires further evaluation. RECOMMENDATION: 1. An outpatient diagnostic bilateral mammogram and targeted left breast ultrasound is recommended. Biopsy will likely be necessary. The patient indicates that she has an outside mammogram from last year, which if available will be helpful for comparison purposes. The findings and recommendation were discussed with Vanessa Fisher in the ER at 2:47 p.m. I have discussed the findings and recommendations with the patient. If applicable, a reminder letter will be sent to the patient regarding the next appointment. BI-RADS CATEGORY  0: Incomplete. Need additional imaging evaluation and/or prior mammograms for comparison. Electronically Signed   By: Ammie Ferrier M.D.   On: 07/14/2021 14:50     PROCEDURES:  Critical Care performed:  No  Procedures   MEDICATIONS ORDERED IN ED: Medications - No data to display   IMPRESSION / MDM / [place] / ED COURSE  I reviewed the triage vital signs and the nursing notes.                              Differential diagnosis includes, but is not limited to, breast abscess, mastitis, Paget's disease, breast mass  Patient's presentation is most consistent with acute complicated illness / injury requiring diagnostic workup.  Patient is evaluated for complaints, with screening left breast ultrasound.  The ultrasound reveals a greater than 5 cm mass in the breast laterally.  The mass is concerning for malignancy given that there are no acute infectious complaints.  Patient's diagnosis is consistent with left breast mass. Patient will be discharged home with prescriptions for Ultram. Patient is to follow up with [place] [facility] for diagnostic mammogram and ultrasound with likely biopsy as needed or otherwise directed.  I have made attempts to schedule the patient directed in ED.  I have left messages both with the breast screening center at [facility], and with Dr. Lajean Manes in medical oncology.  Both clinics were given the patient's name, phone number, and MRN to attempt to make contact.  Patient is also advised to contact clinics and schedule appointment soon as possible.  Patient is given ED precautions to return to the ED for any worsening or new symptoms.     FINAL CLINICAL IMPRESSION(S) / ED DIAGNOSES   Final diagnoses:  Mass overlapping multiple quadrants of left breast     Rx / DC Orders   ED Discharge Orders     None        Note:  This document was prepared using Dragon voice recognition software and may include unintentional dictation errors.    Melvenia Needles, PA-C 07/14/21 Janeice Robinson, MD 07/15/21 [phone]

## 2021-07-14 NOTE — Discharge Instructions (Addendum)
I have made contact with the [facility] breast center on your behalf.  Let them your name and phone number so that they may contact you for an appointment.  Please also reach out to them at the number above, for a routine diagnostic mammogram as well as a repeat left breast ultrasound with biopsy.  You should also follow-up with your primary provider so that she may help coordinate the appointment.

## 2021-07-14 NOTE — ED Triage Notes (Signed)
Pt here with pain under her left breast for 2 days. Pt denies CP. Pt also had a recent colonoscopy and believes that something may have caused an allergic reaction. Pt has bumps on her face. Pt denies N/V/D.

## 2021-07-15 DIAGNOSIS — Z9181 History of falling: Secondary | ICD-10-CM | POA: Diagnosis not present

## 2021-07-15 DIAGNOSIS — M545 Low back pain, unspecified: Secondary | ICD-10-CM | POA: Diagnosis not present

## 2021-07-15 DIAGNOSIS — Z79899 Other long term (current) drug therapy: Secondary | ICD-10-CM | POA: Diagnosis not present

## 2021-07-15 DIAGNOSIS — G8929 Other chronic pain: Secondary | ICD-10-CM | POA: Diagnosis not present

## 2021-07-15 DIAGNOSIS — Z6839 Body mass index (BMI) 39.0-39.9, adult: Secondary | ICD-10-CM | POA: Diagnosis not present

## 2021-07-17 DIAGNOSIS — Z79899 Other long term (current) drug therapy: Secondary | ICD-10-CM | POA: Diagnosis not present

## 2021-07-18 ENCOUNTER — Inpatient Hospital Stay: Payer: Medicare HMO | Attending: Oncology | Admitting: Oncology

## 2021-07-18 ENCOUNTER — Encounter: Payer: Self-pay | Admitting: *Deleted

## 2021-07-18 ENCOUNTER — Inpatient Hospital Stay: Payer: Medicare HMO

## 2021-07-18 ENCOUNTER — Encounter: Payer: Self-pay | Admitting: Oncology

## 2021-07-18 ENCOUNTER — Other Ambulatory Visit: Payer: Self-pay

## 2021-07-18 VITALS — BP 129/74 | HR 80 | Temp 97.1°F | Ht 66.0 in | Wt 221.0 lb

## 2021-07-18 DIAGNOSIS — R928 Other abnormal and inconclusive findings on diagnostic imaging of breast: Secondary | ICD-10-CM | POA: Diagnosis not present

## 2021-07-18 DIAGNOSIS — Z7189 Other specified counseling: Secondary | ICD-10-CM

## 2021-07-18 DIAGNOSIS — N6320 Unspecified lump in the left breast, unspecified quadrant: Secondary | ICD-10-CM | POA: Diagnosis not present

## 2021-07-18 DIAGNOSIS — C50919 Malignant neoplasm of unspecified site of unspecified female breast: Secondary | ICD-10-CM | POA: Insufficient documentation

## 2021-07-18 LAB — COMPREHENSIVE METABOLIC PANEL
ALT: 13 U/L (ref 0–44)
AST: 19 U/L (ref 15–41)
Albumin: 3.8 g/dL (ref 3.5–5.0)
Alkaline Phosphatase: 87 U/L (ref 38–126)
Anion gap: 8 (ref 5–15)
BUN: 11 mg/dL (ref 6–20)
CO2: 25 mmol/L (ref 22–32)
Calcium: 8.9 mg/dL (ref 8.9–10.3)
Chloride: 105 mmol/L (ref 98–111)
Creatinine, Ser: 0.78 mg/dL (ref 0.44–1.00)
GFR, Estimated: >60 mL/min (ref >60)
Glucose, Bld: 112 mg/dL — ABNORMAL HIGH (ref 70–99)
Potassium: 3.7 mmol/L (ref 3.5–5.1)
Sodium: 138 mmol/L (ref 135–145)
Total Bilirubin: 0.4 mg/dL (ref 0.3–1.2)
Total Protein: 7.7 g/dL (ref 6.5–8.1)

## 2021-07-18 LAB — CBC WITH DIFFERENTIAL/PLATELET
Abs Immature Granulocytes: 0.01 10*3/uL (ref 0.00–0.07)
Basophils Absolute: 0 10*3/uL (ref 0.0–0.1)
Basophils Relative: 1 %
Eosinophils Absolute: 0.1 10*3/uL (ref 0.0–0.5)
Eosinophils Relative: 2 %
HCT: 38.1 % (ref 36.0–46.0)
Hemoglobin: 12 g/dL (ref 12.0–15.0)
Immature Granulocytes: 0 %
Lymphocytes Relative: 42 %
Lymphs Abs: 2.2 10*3/uL (ref 0.7–4.0)
MCH: 25.9 pg — ABNORMAL LOW (ref 26.0–34.0)
MCHC: 31.5 g/dL (ref 30.0–36.0)
MCV: 82.1 fL (ref 80.0–100.0)
Monocytes Absolute: 0.4 10*3/uL (ref 0.1–1.0)
Monocytes Relative: 8 %
Neutro Abs: 2.5 10*3/uL (ref 1.7–7.7)
Neutrophils Relative %: 47 %
Platelets: 301 10*3/uL (ref 150–400)
RBC: 4.64 MIL/uL (ref 3.87–5.11)
RDW: 14.6 % (ref 11.5–15.5)
WBC: 5.3 10*3/uL (ref 4.0–10.5)
nRBC: 0 % (ref 0.0–0.2)

## 2021-07-18 NOTE — Progress Notes (Signed)
Accompanied patient to initial medical oncology appointment. Navigation initiated.   Patient is needing further workup/biopsy.   Awaiting mammogram appt.

## 2021-07-18 NOTE — Progress Notes (Signed)
Hematology/Oncology Consult Note [phone] [phone]      Patient Care Team: System, Provider Not In as PCP - General Daiva Huge, RN as Oncology Nurse Navigator  REFERRING PROVIDER: Duffy Bruce, MD   ASSESSMENT & PLAN:  Breast mass, left Obtain bilateral diagnostic mammogram and [place] for further evaluation.  Recommend biopsy of left breast mass.    Goals of care, counseling/discussion Discussed with patient    Orders Placed This Encounter  Procedures   MM DIAG BREAST TOMO BILATERAL    Pt had breast ultrasound in ER 07/2021. Prior breast imaging needed. Pt was advised to come by [place] and sign ROI.     Standing Status:   Future    Standing Expiration Date:   07/19/2022    Order Specific Question:   Reason for Exam (SYMPTOM  OR DIAGNOSIS REQUIRED)    Answer:   large left outer quadrant mass @ 3o'clock- 5cm    Order Specific Question:   Is the patient pregnant?    Answer:   No    Order Specific Question:   Preferred imaging location?    Answer:   [facility]   US BREAST LTD UNI LEFT INC AXILLA    Pt had breast ultrasound in ER 07/2021. Prior breast imaging needed. Pt was advised to come by [place] and sign ROI.     Standing Status:   Future    Standing Expiration Date:   07/19/2022    Order Specific Question:   Reason for Exam (SYMPTOM  OR DIAGNOSIS REQUIRED)    Answer:   large left outer quadrant mass @ 3o'clock- 5cm    Order Specific Question:   Preferred imaging location?    Answer:   [facility]   US BREAST LTD UNI RIGHT INC AXILLA    Pt had breast ultrasound in ER 07/2021. Prior breast imaging needed. Pt was advised to come by [place] and sign ROI.     Standing Status:   Future    Standing Expiration Date:   07/19/2022    Order Specific Question:   Reason for Exam (SYMPTOM  OR DIAGNOSIS REQUIRED)    Answer:   large left outer quadrant mass @ 3o'clock- 5cm    Order Specific Question:   Preferred imaging location?     Answer:   [facility]   CBC with Differential/Platelet    Standing Status:   Future    Number of Occurrences:   1    Standing Expiration Date:   07/19/2022   Comprehensive metabolic panel    Standing Status:   Future    Number of Occurrences:   1    Standing Expiration Date:   07/18/2022   Cancer antigen 15-3    Standing Status:   Future    Number of Occurrences:   1    Standing Expiration Date:   07/19/2022   Cancer antigen 27.29    Standing Status:   Future    Number of Occurrences:   1    Standing Expiration Date:   07/19/2022    All questions were answered. The patient knows to call the clinic with any problems questions or concerns.  Return of visit: TBD Thank you for this kind referral and the opportunity to participate in the care of this patient. A copy of today's note is routed to referring provider   Earlie Server, MD, PhD [facility] Hematology Oncology 07/18/2021     CHIEF COMPLAINTS/REASON FOR VISIT:  Evaluation of breast cancer  HISTORY OF PRESENTING ILLNESS:  Vanessa Fisher is a  61 y.o.  female with PMH listed below who was referred to me for evaluation of  left breast mass  07/14/2021, patient presented emergency room for evaluation of left breast mass.  She has noticed the left mass for some time.  07/14/2021, ultrasound breast unilateral l left breast showed 5.3 cm mass in the left breast. Patient was referred to oncology for further evaluation.  Patient was not able to give me details of how long she has noticed this mass. Denies any nipple discharge, skin changes. She is a poor historian.  Denies family history of breast cancer.  I reviewed her records extensively and collaborated the history with the patient. SUMMARY OF ONCOLOGIC HISTORY: Oncology History   No history exists.    MEDICAL HISTORY:  Past Medical History:  Diagnosis Date   Anemia    Anxiety    Arthritis    Asthma    WELL CONTROLLED   Bile acid malabsorption syndrome     Bradycardia    HAD AN ISSUE WITH THIS IN 2016-NO PROBLEMS SINCE   Depression    Diabetes mellitus without complication (HCC)    Diverticulitis    Gastric reflux    GERD (gastroesophageal reflux disease)    Hand pain 05/12/2016   Headache    H/O MIGRAINES   History of kidney stones    H/O   Neck pain, chronic    Panic attacks     SURGICAL HISTORY: Past Surgical History:  Procedure Laterality Date   CARPAL TUNNEL RELEASE Right 12/06/2017   Procedure: CARPAL TUNNEL RELEASE;  Surgeon: Earnestine Leys, MD;  Location: [facility] ORS;  Service: Orthopedics;  Laterality: Right;   COLONOSCOPY WITH PROPOFOL N/A 06/11/2021   Procedure: COLONOSCOPY WITH PROPOFOL;  Surgeon: Lin Landsman, MD;  Location: [facility] ENDOSCOPY;  Service: Gastroenterology;  Laterality: N/A;   DORSAL COMPARTMENT RELEASE Right 12/06/2017   Procedure: RELEASE DORSAL COMPARTMENT (DEQUERVAIN);  Surgeon: Earnestine Leys, MD;  Location: [facility] ORS;  Service: Orthopedics;  Laterality: Right;   ESOPHAGOGASTRODUODENOSCOPY (EGD) WITH PROPOFOL N/A 06/11/2021   Procedure: ESOPHAGOGASTRODUODENOSCOPY (EGD) WITH PROPOFOL;  Surgeon: Lin Landsman, MD;  Location: [facility] ENDOSCOPY;  Service: Gastroenterology;  Laterality: N/A;   PARTIAL HYSTERECTOMY     TUBAL LIGATION      SOCIAL HISTORY: Social History   Socioeconomic History   Marital status: Single    Spouse name: Not on file   Number of children: Not on file   Years of education: Not on file   Highest education level: Not on file  Occupational History   Not on file  Tobacco Use   Smoking status: Never   Smokeless tobacco: Never  Vaping Use   Vaping Use: Never used  Substance and Sexual Activity   Alcohol use: No   Drug use: No   Sexual activity: Not on file  Other Topics Concern   Not on file  Social History Narrative   Not on file   Social Determinants of Health   Financial Resource Strain: Not on file  Food Insecurity: Not on file  Transportation Needs: Not on  file  Physical Activity: Not on file  Stress: Not on file  Social Connections: Not on file  Intimate Partner Violence: Not on file    FAMILY HISTORY: Family History  Problem Relation Age of Onset   Diabetes Mother    Hypertension Mother    Heart failure Mother    Cancer Mother    Diabetes  Father    Hypertension Father     ALLERGIES:  is allergic to bc powder [aspirin-salicylamide-caffeine], gabapentin, hydrocodone-acetaminophen, latex, penicillin g, penicillins, and shrimp [shellfish allergy].  MEDICATIONS:  Current Outpatient Medications  Medication Sig Dispense Refill   albuterol (PROVENTIL) (2.5 MG/3ML) 0.083% nebulizer solution Take 3 mLs (2.5 mg total) by nebulization every 4 (four) hours as needed for wheezing or shortness of breath. 75 mL 2   albuterol (VENTOLIN HFA) 108 (90 Base) MCG/ACT inhaler Inhale 2 puffs into the lungs every 6 (six) hours as needed for wheezing or shortness of breath. 18 g 0   aspirin 81 MG chewable tablet Chew 81 mg by mouth daily.     cetirizine-pseudoephedrine (ZYRTEC-D) 5-120 MG tablet Take 1 tablet by mouth daily. 30 tablet 0   DULoxetine (CYMBALTA) 20 MG capsule Take 1 capsule by mouth daily.     FLOVENT DISKUS 250 MCG/ACT AEPB Inhale into the lungs.     fluticasone (FLONASE) 50 MCG/ACT nasal spray Place 2 sprays into both nostrils daily. 15.8 mL 0   glimepiride (AMARYL) 2 MG tablet Take 2 mg by mouth daily.  2   lisinopril-hydrochlorothiazide (ZESTORETIC) 10-12.5 MG tablet Take 1 tablet by mouth daily.     metoCLOPramide (REGLAN) 5 MG tablet Take 1 tablet (5 mg total) by mouth 4 (four) times daily -  before meals and at bedtime. 120 tablet 0   montelukast (SINGULAIR) 10 MG tablet Take by mouth.     naloxone (NARCAN) nasal spray 4 mg/0.1 mL SMARTSIG:Both Nares     ondansetron (ZOFRAN-ODT) 8 MG disintegrating tablet Take 1 tablet (8 mg total) by mouth every 8 (eight) hours as needed for nausea or vomiting. 12 tablet 0   oxyCODONE-acetaminophen  (PERCOCET) 10-325 MG tablet Take 1 tablet by mouth 3 (three) times daily as needed.     phenazopyridine (PYRIDIUM) 200 MG tablet Take 1 tablet (200 mg total) by mouth 3 (three) times daily. 6 tablet 0   triamcinolone cream (KENALOG) 0.1 % Apply 1 application topically 2 (two) times daily as needed. 30 g 0   TRULICITY 3 DV/7.6HY SOPN SMARTSIG:3 Milligram(s) SUB-Q Once a Week     No current facility-administered medications for this visit.    Review of Systems  Constitutional:  Negative for appetite change, chills, fatigue and fever.  HENT:   Negative for hearing loss and voice change.   Eyes:  Negative for eye problems.  Respiratory:  Negative for chest tightness and cough.   Cardiovascular:  Negative for chest pain.  Gastrointestinal:  Negative for abdominal distention, abdominal pain and blood in stool.  Endocrine: Negative for hot flashes.  Genitourinary:  Negative for difficulty urinating and frequency.   Musculoskeletal:  Negative for arthralgias.  Skin:  Negative for itching and rash.  Neurological:  Negative for extremity weakness.  Hematological:  Negative for adenopathy.  Psychiatric/Behavioral:  Negative for confusion.   Left breast mass.  PHYSICAL EXAMINATION: ECOG PERFORMANCE STATUS: 1 - Symptomatic but completely ambulatory Vitals:   07/18/21 1133  BP: 129/74  Pulse: 80  Temp: (!) 97.1 F (36.2 C)   Filed Weights   07/18/21 1133  Weight: 221 lb (100.2 kg)   Physical Exam Constitutional:      General: She is not in acute distress. HENT:     Head: Normocephalic and atraumatic.  Eyes:     General: No scleral icterus. Cardiovascular:     Rate and Rhythm: Normal rate and regular rhythm.     Heart sounds: Normal heart sounds.  Pulmonary:     Effort: Pulmonary effort is normal. No respiratory distress.     Breath sounds: No wheezing.  Abdominal:     General: Bowel sounds are normal. There is no distension.     Palpations: Abdomen is soft.  Musculoskeletal:         General: No deformity. Normal range of motion.     Cervical back: Normal range of motion and neck supple.  Skin:    General: Skin is warm and dry.     Findings: No erythema or rash.  Neurological:     Mental Status: She is alert and oriented to person, place, and time. Mental status is at baseline.     Cranial Nerves: No cranial nerve deficit.     Coordination: Coordination normal.  Psychiatric:        Mood and Affect: Mood normal.   Gross examination reviewed palpable fixed left breast firm mass, no nipple discharge, skin is intact.  No appreciable mass in the right breast.  No appreciable lymphadenopathy in the axilla bilaterally.   LABORATORY DATA:  I have reviewed the data as listed Lab Results  Component Value Date   WBC 5.3 07/18/2021   HGB 12.0 07/18/2021   HCT 38.1 07/18/2021   MCV 82.1 07/18/2021   PLT 301 07/18/2021   Lab Results  Component Value Date   NA 138 07/18/2021   K 3.7 07/18/2021   CL 105 07/18/2021   CO2 25 07/18/2021    RADIOGRAPHIC STUDIES: I have personally reviewed the radiological images as listed and agreed with the findings in the report. US BREAST LTD UNI LEFT INC AXILLA  Result Date: 07/14/2021 CLINICAL DATA:  61 year old female currently in the ER with complaints of a palpable lump in her left breast. There is no associated pain. EXAM: ULTRASOUND OF THE LEFT BREAST COMPARISON:  None Available. FINDINGS: Ultrasound targeted to the palpable lump in the left breast at 3 o'clock demonstrates a large irregular mass with internal blood flow. The mass measures 5.3 x 2.4 x 4.8 cm. IMPRESSION: Targeted ultrasound images demonstrate a 5.3 cm mass in the left breast. Given the lack of infectious symptoms related to the breast, this appearance is suspicious for malignancy and requires further evaluation. RECOMMENDATION: 1. An outpatient diagnostic bilateral mammogram and targeted left breast ultrasound is recommended. Biopsy will likely be necessary. The  patient indicates that she has an outside mammogram from last year, which if available will be helpful for comparison purposes. The findings and recommendation were discussed with Jenise Menshew in the ER at 2:47 p.m. I have discussed the findings and recommendations with the patient. If applicable, a reminder letter will be sent to the patient regarding the next appointment. BI-RADS CATEGORY  0: Incomplete. Need additional imaging evaluation and/or prior mammograms for comparison. Electronically Signed   By: Ammie Ferrier M.D.   On: 07/14/2021 14:50  NM Gastric Emptying  Result Date: 07/06/2021 CLINICAL DATA:  Chronic epigastric pain EXAM: NUCLEAR MEDICINE GASTRIC EMPTYING SCAN TECHNIQUE: After oral ingestion of radiolabeled meal, sequential abdominal images were obtained for 4 hours. Percentage of activity emptying the stomach was calculated at 1 hour, 2 hour, 3 hour, and 4 hours. RADIOPHARMACEUTICALS:  2.34 mCi Tc-40msulfur colloid in standardized meal COMPARISON:  Ultrasound 05/20/2021, CT  04/17/2021 FINDINGS: Expected location of the stomach in the left upper quadrant. Ingested meal empties the stomach poorly over the course of the study. 18% emptied at 1 hr ( normal >= 10%) 17% emptied at 2 hr (  normal >= 40%) 25% emptied at 3 hr ( normal >= 70%) 37% emptied at 4 hr ( normal >= 90%) IMPRESSION: Delayed gastric emptying study. Electronically Signed   By: Donavan Foil M.D.   On: 07/06/2021 23:50

## 2021-07-18 NOTE — Assessment & Plan Note (Addendum)
Obtain bilateral diagnostic mammogram and [place] for further evaluation.  Recommend biopsy of left breast mass.

## 2021-07-19 LAB — CANCER ANTIGEN 27.29: CA 27.29: 18.5 U/mL (ref 0.0–38.6)

## 2021-07-19 LAB — CANCER ANTIGEN 15-3: CA 15-3: 19.2 U/mL (ref 0.0–25.0)

## 2021-07-21 ENCOUNTER — Encounter: Payer: Self-pay | Admitting: *Deleted

## 2021-07-21 ENCOUNTER — Inpatient Hospital Stay: Payer: Medicare HMO

## 2021-07-21 DIAGNOSIS — N6320 Unspecified lump in the left breast, unspecified quadrant: Secondary | ICD-10-CM

## 2021-07-21 NOTE — Progress Notes (Signed)
Received a call from the patient that her power bill is behind and her power may be turned off.   She is going to come by today and fill out a financial assistance application and bring her power bill and proof of income.  Social work consult will also be placed.

## 2021-07-21 NOTE — Progress Notes (Signed)
[facility] [facility]  Clinical Social Work was referred by Art therapist for assessment of psychosocial needs.  Clinical Social Worker contacted patient by phone  to offer support and assess for needs.    Patient stated she was behind in her utility bills.  She previously had a part-time job, which provided some income.  She received assistance last year from [employer] and stated she was no longer eligible for assistance.  She reported contacting some local churches and the [facility], but they did not have funds available.  CSW gave her the phone number of the [facility] at [phone].  Her son lives near her, but he is unable to help her due to a previous accident.  He is pending disability approval.  Plan is for CSW to continue researching available assistance in [facility] and to provide patient with information.   Margaree Mackintosh, LCSW  Clinical Social Worker [facility]

## 2021-07-23 ENCOUNTER — Other Ambulatory Visit: Payer: Self-pay

## 2021-07-24 ENCOUNTER — Ambulatory Visit (INDEPENDENT_AMBULATORY_CARE_PROVIDER_SITE_OTHER): Payer: Medicare HMO | Admitting: Gastroenterology

## 2021-07-24 ENCOUNTER — Encounter: Payer: Self-pay | Admitting: Gastroenterology

## 2021-07-24 VITALS — BP 129/78 | HR 67 | Temp 97.5°F | Ht 66.0 in | Wt 217.0 lb

## 2021-07-24 DIAGNOSIS — K3184 Gastroparesis: Secondary | ICD-10-CM | POA: Diagnosis not present

## 2021-07-24 NOTE — Patient Instructions (Signed)
Gastroparesis  Gastroparesis is a condition in which food takes longer than normal to empty from the stomach. This condition is also known as delayed gastric emptying. It is usually a long-term (chronic) condition. There is no cure, but there are treatments and things that you can do at home to help relieve symptoms. Treating the underlying condition that causes gastroparesis can also help relieve symptoms. What are the causes? In many cases, the cause of this condition is not known. Possible causes include: A hormone (endocrine) disorder, such as hypothyroidism or diabetes. A nervous system disease, such as Parkinson's disease or multiple sclerosis. Cancer, infection, or surgery that affects the stomach or vagus nerve. The vagus nerve runs from your chest, through your neck, and to the lower part of your brain. A connective tissue disorder, such as scleroderma. Certain medicines. What increases the risk? You are more likely to develop this condition if: You have certain disorders or diseases. These may include: An endocrine disorder. An eating disorder. Amyloidosis. Scleroderma. Parkinson's disease. Multiple sclerosis. Cancer or infection of the stomach or the vagus nerve. You have had surgery on your stomach or vagus nerve. You take certain medicines. You are female. What are the signs or symptoms? Symptoms of this condition include: Feeling full after eating very little or a loss of appetite. Nausea, vomiting, or heartburn. Bloating of your abdomen. Inconsistent blood sugar (glucose) levels on blood tests. Unexplained weight loss. Acid from the stomach coming up into the esophagus (gastroesophageal reflux). Sudden tightening (spasm) of the stomach, which can be painful. Symptoms may come and go. Some people may not notice any symptoms. How is this diagnosed? This condition is diagnosed with tests, such as: Tests that check how long it takes food to move through the stomach and  intestines. These tests include: Upper gastrointestinal (GI) series. For this test, you drink a liquid that shows up well on X-rays, and then X-rays are taken of your intestines. Gastric emptying scintigraphy. For this test, you eat food that contains a small amount of radioactive material, and then scans are taken. Wireless capsule GI monitoring system. For this test, you swallow a pill (capsule) that records information about how foods and fluid move through your stomach. Gastric manometry. For this test, a tube is passed down your throat and into your stomach to measure electrical and muscular activity. Endoscopy. For this test, a long, thin tube with a camera and light on the end is passed down your throat and into your stomach to check for problems in your stomach lining. Ultrasound. This test uses sound waves to create images of the inside of your body. This can help rule out gallbladder disease or pancreatitis as a cause of your symptoms. How is this treated? There is no cure for this condition, but treatment and home care may relieve symptoms. Treatment may include: Treating the underlying cause. Managing your symptoms by making changes to your diet and exercise habits. Taking medicines to control nausea and vomiting and to stimulate stomach muscles. Getting food through a feeding tube in the hospital. This may be done in severe cases. Having surgery to insert a device called a gastric electrical stimulator into your body. This device helps improve stomach emptying and control nausea and vomiting. Follow these instructions at home: Take over-the-counter and prescription medicines only as told by your health care provider. Follow instructions from your health care provider about eating or drinking restrictions. Your health care provider may recommend that you: Eat smaller meals more often. Eat   low-fat foods. Eat low-fiber forms of high-fiber foods. For example, eat cooked vegetables instead  of raw vegetables. Have only liquid foods instead of solid foods. Liquid foods are easier to digest. Drink enough fluid to keep your urine pale yellow. Exercise as often as told by your health care provider. Keep all follow-up visits. This is important. Contact a health care provider if you: Notice that your symptoms do not improve with treatment. Have new symptoms. Get help right away if you: Have severe pain in your abdomen that does not improve with treatment. Have nausea that is severe or does not go away. Vomit every time you drink fluids. Summary Gastroparesis is a long-term (chronic) condition in which food takes longer than normal to empty from the stomach. Symptoms include nausea, vomiting, heartburn, bloating of your abdomen, and loss of appetite. Eating smaller portions, low-fat foods, and low-fiber forms of high-fiber foods may help you manage your symptoms. Get help right away if you have severe pain in your abdomen. This information is not intended to replace advice given to you by your health care provider. Make sure you discuss any questions you have with your health care provider. Document Revised: 05/29/2019 Document Reviewed: 05/29/2019 Elsevier Patient Education  2023 Elsevier Inc.  

## 2021-07-26 DIAGNOSIS — Z7189 Other specified counseling: Secondary | ICD-10-CM | POA: Insufficient documentation

## 2021-07-31 ENCOUNTER — Ambulatory Visit
Admission: RE | Admit: 2021-07-31 | Discharge: 2021-07-31 | Disposition: A | Discharge status: Home / Self Care | Payer: Medicare HMO | Source: Ambulatory Visit | Attending: Oncology | Admitting: Oncology

## 2021-07-31 DIAGNOSIS — R928 Other abnormal and inconclusive findings on diagnostic imaging of breast: Secondary | ICD-10-CM | POA: Diagnosis not present

## 2021-07-31 DIAGNOSIS — N6325 Unspecified lump in the left breast, overlapping quadrants: Secondary | ICD-10-CM | POA: Insufficient documentation

## 2021-07-31 DIAGNOSIS — N6320 Unspecified lump in the left breast, unspecified quadrant: Secondary | ICD-10-CM | POA: Insufficient documentation

## 2021-07-31 DIAGNOSIS — R590 Localized enlarged lymph nodes: Secondary | ICD-10-CM | POA: Diagnosis not present

## 2021-07-31 DIAGNOSIS — N6489 Other specified disorders of breast: Secondary | ICD-10-CM | POA: Diagnosis not present

## 2021-08-01 ENCOUNTER — Other Ambulatory Visit: Payer: Self-pay | Admitting: Oncology

## 2021-08-01 DIAGNOSIS — N63 Unspecified lump in unspecified breast: Secondary | ICD-10-CM

## 2021-08-01 DIAGNOSIS — R599 Enlarged lymph nodes, unspecified: Secondary | ICD-10-CM

## 2021-08-01 DIAGNOSIS — R928 Other abnormal and inconclusive findings on diagnostic imaging of breast: Secondary | ICD-10-CM

## 2021-08-02 DIAGNOSIS — C50912 Malignant neoplasm of unspecified site of left female breast: Secondary | ICD-10-CM

## 2021-08-02 HISTORY — DX: Malignant neoplasm of unspecified site of left female breast: C50.912

## 2021-08-04 ENCOUNTER — Telehealth: Payer: Self-pay

## 2021-08-04 NOTE — Telephone Encounter (Signed)
-----   Message from Earlie Server, MD sent at 08/02/2021  5:41 PM EDT ----- Please schedule patient to get US guided biopsy of left breast mass and left axillary LN.  Please schedule her to see me MD to review results. -1 week after biopsy

## 2021-08-04 NOTE — Telephone Encounter (Signed)
Not yet. Will let you know when its scheduled.

## 2021-08-04 NOTE — Telephone Encounter (Signed)
Orders have been entered by [facility] and looks like they will be contacting pt with appt.

## 2021-08-07 ENCOUNTER — Encounter: Payer: Self-pay | Admitting: *Deleted

## 2021-08-07 NOTE — Telephone Encounter (Signed)
Pt scheduled for biopsy on 7/20. Please schedule MD only approx 1 week after biopsy to review results,  and inform pt of appt. Thanks

## 2021-08-07 NOTE — Progress Notes (Signed)
Patient called asking about her financial assistance.   Reached out to Ulice Dash who said the funds had been dispersed to [employer] and to pay her rent.   The payment is still processing.   Patient also asking about biopsy appt.   Reached out to [place] at [place] who said she will be calling patient today to schedule the biopsy.   Returned call to Ms. Beegle with above info.   Will await biopsy appt and get her on Dr. Collie Siad schedule afterward.

## 2021-08-12 DIAGNOSIS — Z79899 Other long term (current) drug therapy: Secondary | ICD-10-CM | POA: Diagnosis not present

## 2021-08-12 DIAGNOSIS — Z6839 Body mass index (BMI) 39.0-39.9, adult: Secondary | ICD-10-CM | POA: Diagnosis not present

## 2021-08-12 DIAGNOSIS — Z6834 Body mass index (BMI) 34.0-34.9, adult: Secondary | ICD-10-CM | POA: Diagnosis not present

## 2021-08-12 DIAGNOSIS — G8929 Other chronic pain: Secondary | ICD-10-CM | POA: Diagnosis not present

## 2021-08-12 DIAGNOSIS — M545 Low back pain, unspecified: Secondary | ICD-10-CM | POA: Diagnosis not present

## 2021-08-12 DIAGNOSIS — Z9181 History of falling: Secondary | ICD-10-CM | POA: Diagnosis not present

## 2021-08-15 ENCOUNTER — Encounter: Payer: Self-pay | Admitting: *Deleted

## 2021-08-15 ENCOUNTER — Inpatient Hospital Stay: Payer: Medicare HMO | Attending: Oncology | Admitting: Licensed Clinical Social Worker

## 2021-08-15 ENCOUNTER — Other Ambulatory Visit: Payer: Self-pay | Admitting: *Deleted

## 2021-08-15 DIAGNOSIS — C50812 Malignant neoplasm of overlapping sites of left female breast: Secondary | ICD-10-CM | POA: Insufficient documentation

## 2021-08-15 DIAGNOSIS — N6320 Unspecified lump in the left breast, unspecified quadrant: Secondary | ICD-10-CM

## 2021-08-15 DIAGNOSIS — Z171 Estrogen receptor negative status [ER-]: Secondary | ICD-10-CM | POA: Insufficient documentation

## 2021-08-15 DIAGNOSIS — G894 Chronic pain syndrome: Secondary | ICD-10-CM | POA: Insufficient documentation

## 2021-08-15 DIAGNOSIS — N6325 Unspecified lump in the left breast, overlapping quadrants: Secondary | ICD-10-CM

## 2021-08-15 NOTE — Progress Notes (Signed)
[facility]  Initial Assessment   Vanessa Fisher is a 61 y.o. year old female contacted by phone. Clinical Social Work was referred by nurse navigator for assessment of psychosocial needs.   SDOH (Social Determinants of Health) assessments performed: Yes SDOH Interventions    Flowsheet Row Most Recent Value  SDOH Interventions   Food Insecurity Interventions Other (Comment)  [Patient was told by DSS she does not qualify for food stamps. Patietn does hae some food insecurity.]  Financial Strain Interventions Development worker, community  Housing Interventions Other (Comment)  [Patient has already applied for disability and has applied for Medicaid]  Physical Activity Interventions Intervention Not Indicated  Stress Interventions Provide Counseling  Social Connections Interventions Intervention Not Indicated  Transportation Interventions Intervention Not Indicated, [facility] [place] ([place]. Only), Other (Comment)  [Patient may need transportation assitance if she begins treatment after diagnosis]  Depression Interventions/Treatment  Counseling       SDOH Screenings   Alcohol Screen: Not on file  Depression (PHQ2-9): Low Risk  (08/15/2021)   Depression (PHQ2-9)    PHQ-2 Score: 4  Financial Resource Strain: High Risk (08/15/2021)   Overall Financial Resource Strain (CARDIA)    Difficulty of Paying Living Expenses: Very hard  Food Insecurity: Food Insecurity Present (08/15/2021)   Hunger Vital Sign    Worried About Running Out of Food in the Last Year: Sometimes true    Ran Out of Food in the Last Year: Never true  Housing: High Risk (08/15/2021)   Housing    Last Housing Risk Score: 2  Physical Activity: Inactive (08/15/2021)   Exercise Vital Sign    Days of Exercise per Week: 0 days    Minutes of Exercise per Session: 0 min  Social Connections: Socially Isolated (08/15/2021)   Social Connection and Isolation Panel [NHANES]    Frequency of Communication with Friends  and Family: Once a week    Frequency of Social Gatherings with Friends and Family: Once a week    Attends Religious Services: Never    Marine scientist or Organizations: No    Attends Music therapist: Not on file    Marital Status: Never married  Stress: Stress Concern Present (08/15/2021)   [employer] [facility] [place]    Feeling of Stress : Rather much  Tobacco Use: Low Risk  (07/31/2021)   Patient History    Smoking Tobacco Use: Never    Smokeless Tobacco Use: Never    Passive Exposure: Not on file  Transportation Needs: No Transportation Needs (08/15/2021)   PRAPARE - Transportation    Lack of Transportation (Medical): No    Lack of Transportation (Non-Medical): No     Distress Screen completed: Yes     No data to display            Family/Social Information:  Housing Arrangement: patient lives alone. Family members/support persons in your life? Family and Medical Staff Transportation concerns: no, not at the moment but may need transportation services in the near future  Employment: Unemployed .[employer]  Income source: No income Financial concerns: Yes, due to illness and/or loss of work during treatment Type of concern: Utilities, Film/video editor, Transportation, Medical bills, and [employer] access concerns: no, not at the moment but patient is not employed and does not qualify to receive food stamps Religious or spiritual practice: Not known Services Currently in place:  [facility], Patient has applied for medicaid and disability (not w/ [facility]).  Coping/ Adjustment to diagnosis: Patient understands treatment plan and what happens next? no, not yet, patient will receive information and possible diagnosis on 7/27, after biopsy Concerns about diagnosis and/or treatment: Losing my job and/or losing income, Afraid of cancer, How I will pay for the services I need, and How will I care for  myself Patient reported stressors: Housing, Insurance underwriter, Work/ school, Publishing rights manager, Transport planner, Depression, Anxiety/ nervousness, Adjusting to my illness, and Isolation/ feeling alone Hopes and/or priorities: N/A Patient enjoys  N/A Current coping skills/ strengths: Capable of independent living , Motivation for Theatre manager , and Work skills     SUMMARY: Current SDOH Barriers:  Financial constraints related to no income, Limited social support, Housing barriers, Medication procurement, Mental Health Concerns , Social Isolation, and overall financial concerns for daily living  Clinical Social Work Clinical Goal(s):  Explore community resource options for unmet needs related to:  Housing , Transportation, and Food Insecurity  Patient has applied for [employer] and Disability on her own, and has been told medicaid will take 45-60 days for approval and possibly 120 days for social security disability.  Patient was informed by DSS case worker, that she will not qualify for food stamps, sue to her past record.   Interventions: Discussed common feeling and emotions when being diagnosed with cancer, and the importance of support during treatment Informed patient of the support team roles and support services at [facility] Provided CSW contact information and encouraged patient to call with any questions or concerns Provided patient with information about CSW role inpatient care and other available resources.   Follow Up Plan: Patient will contact CSW with any support or resource needs and once patient receives diagnosis CSW will contact patient to discuss other financial assistance opportunities.  Patient states she spoke with Ulice Dash, Designer, jewellery and turned in information for rental and utility payment assistance, but she was contact by [employer] and they told her bill has not been paid.  CSW stated I would update Ulice Dash. Patient verbalizes understanding of plan: Yes    [employer],  LCSW

## 2021-08-15 NOTE — Progress Notes (Signed)
Patient called asking for someone to call her to help with "setting up her check and financial stuff"  She also verbalized anxiety and stress related to pending workup of her breast mass.  Spoke with Maurice Small who is happy to call [name].   Also clarified appointments for biopsy and follow up with Dr. Tasia Catchings.

## 2021-08-16 DIAGNOSIS — L237 Allergic contact dermatitis due to plants, except food: Secondary | ICD-10-CM | POA: Diagnosis not present

## 2021-08-18 DIAGNOSIS — Z0100 Encounter for examination of eyes and vision without abnormal findings: Secondary | ICD-10-CM | POA: Diagnosis not present

## 2021-08-18 DIAGNOSIS — H25013 Cortical age-related cataract, bilateral: Secondary | ICD-10-CM | POA: Diagnosis not present

## 2021-08-18 DIAGNOSIS — H04123 Dry eye syndrome of bilateral lacrimal glands: Secondary | ICD-10-CM | POA: Diagnosis not present

## 2021-08-18 DIAGNOSIS — E119 Type 2 diabetes mellitus without complications: Secondary | ICD-10-CM | POA: Diagnosis not present

## 2021-08-21 ENCOUNTER — Ambulatory Visit
Admission: RE | Admit: 2021-08-21 | Discharge: 2021-08-21 | Disposition: A | Discharge status: Home / Self Care | Payer: Medicare HMO | Source: Ambulatory Visit | Attending: Oncology | Admitting: Oncology

## 2021-08-21 DIAGNOSIS — C773 Secondary and unspecified malignant neoplasm of axilla and upper limb lymph nodes: Secondary | ICD-10-CM | POA: Insufficient documentation

## 2021-08-21 DIAGNOSIS — C50012 Malignant neoplasm of nipple and areola, left female breast: Secondary | ICD-10-CM | POA: Insufficient documentation

## 2021-08-21 DIAGNOSIS — C50912 Malignant neoplasm of unspecified site of left female breast: Secondary | ICD-10-CM | POA: Diagnosis present

## 2021-08-21 DIAGNOSIS — R590 Localized enlarged lymph nodes: Secondary | ICD-10-CM | POA: Diagnosis not present

## 2021-08-21 DIAGNOSIS — R599 Enlarged lymph nodes, unspecified: Secondary | ICD-10-CM

## 2021-08-21 DIAGNOSIS — R928 Other abnormal and inconclusive findings on diagnostic imaging of breast: Secondary | ICD-10-CM

## 2021-08-21 DIAGNOSIS — N63 Unspecified lump in unspecified breast: Secondary | ICD-10-CM

## 2021-08-21 DIAGNOSIS — C50512 Malignant neoplasm of lower-outer quadrant of left female breast: Secondary | ICD-10-CM | POA: Diagnosis not present

## 2021-08-21 HISTORY — PX: BREAST BIOPSY: SHX20

## 2021-08-25 ENCOUNTER — Other Ambulatory Visit: Payer: Self-pay | Admitting: Anatomic Pathology & Clinical Pathology

## 2021-08-25 ENCOUNTER — Encounter: Payer: Self-pay | Admitting: *Deleted

## 2021-08-25 LAB — SURGICAL PATHOLOGY

## 2021-08-25 NOTE — Progress Notes (Signed)
Patient called to ask about the assistance with her rent.   Vanessa Fisher said the check was mailed out on 7/19 and may be in transit still.   [name] will also email landlord to update.  Patient also received her pathology results today.   She has an appt. With Dr. Tasia Catchings on Thursday to review results and update treatment plan.   Dr. Tasia Catchings will let me know if she would like me to get a surgeon scheduled prior to her appt on Thursday.  Patient given above information.

## 2021-08-27 ENCOUNTER — Encounter: Payer: Self-pay | Admitting: *Deleted

## 2021-08-28 ENCOUNTER — Encounter: Payer: Self-pay | Admitting: Oncology

## 2021-08-28 ENCOUNTER — Inpatient Hospital Stay (HOSPITAL_BASED_OUTPATIENT_CLINIC_OR_DEPARTMENT_OTHER): Payer: Medicare HMO | Admitting: Oncology

## 2021-08-28 ENCOUNTER — Encounter: Payer: Self-pay | Admitting: *Deleted

## 2021-08-28 VITALS — BP 116/73 | HR 86 | Temp 97.3°F | Wt 215.0 lb

## 2021-08-28 DIAGNOSIS — Z7189 Other specified counseling: Secondary | ICD-10-CM

## 2021-08-28 DIAGNOSIS — Z79899 Other long term (current) drug therapy: Secondary | ICD-10-CM | POA: Diagnosis not present

## 2021-08-28 DIAGNOSIS — N6325 Unspecified lump in the left breast, overlapping quadrants: Secondary | ICD-10-CM

## 2021-08-28 DIAGNOSIS — Z5181 Encounter for therapeutic drug level monitoring: Secondary | ICD-10-CM | POA: Diagnosis not present

## 2021-08-28 DIAGNOSIS — C50812 Malignant neoplasm of overlapping sites of left female breast: Secondary | ICD-10-CM | POA: Diagnosis not present

## 2021-08-28 DIAGNOSIS — Z171 Estrogen receptor negative status [ER-]: Secondary | ICD-10-CM

## 2021-08-28 DIAGNOSIS — G894 Chronic pain syndrome: Secondary | ICD-10-CM

## 2021-08-28 NOTE — Progress Notes (Signed)
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

## 2021-08-28 NOTE — Progress Notes (Signed)
Hematology/Oncology Progress note [phone] [phone]         Patient Care Team: System, Provider Not In as PCP - General Daiva Huge, RN as Oncology Nurse Navigator  REFERRING PROVIDER: Duffy Bruce, MD   ASSESSMENT & PLAN:   Cancer Staging  Breast cancer [facility]) Staging form: Breast, AJCC 8th Edition - Clinical: Stage IIIA (cT3, cN1, cM0, G3, ER-, PR-, HER2+) - Signed by Earlie Server, MD on 08/28/2021  Breast cancer ([place]) Left breast cancer, cT3 N1 Mx, ER/PR-, HER2 + Imaging findings and pathology reports were reviewed and discussed with patient I recommend patient to proceed with PET scan and MRI brain with and without contrast to finish staging. If patient does not have any distant metastasis, I recommend neoadjuvant chemotherapy with TCHP followed by surgery. The diagnosis and care plan were discussed with patient in detail.  The goal of treatment will be curative.   Chemotherapy education was provided.  I explained to the patient the risks and benefits of chemotherapy including all but not limited to hair loss, mouth sore, nausea, vomiting, diarrhea, low blood counts, bleeding, neuropathy, heart failure and risk of life threatening infection and even death, secondary malignancy etc.  Patient voices understanding and willing to proceed chemotherapy.   If patient has developed metastatic disease, recommend trastuzumab, pertuzumab, and a taxane  # Chemotherapy education; discussed about option of Medi- port placement.  Check 2D echo.  Patient prefers to use peripheral vein access. Antiemetics-Zofran and Compazine;   Goals of care, counseling/discussion Discussed with patient  Chronic pain syndrome She follows with pain clinic.     Orders Placed This Encounter  Procedures   NM PET Image Initial (PI) Skull Base To Thigh    Standing Status:   Future    Standing Expiration Date:   08/28/2022    Order Specific Question:   If indicated for the ordered  procedure, I authorize the administration of a radiopharmaceutical per Radiology protocol    Answer:   Yes    Order Specific Question:   Is the patient pregnant?    Answer:   No    Order Specific Question:   Preferred imaging location?    Answer:   [facility]   MR Brain W Wo Contrast    Standing Status:   Future    Standing Expiration Date:   08/28/2022    Order Specific Question:   If indicated for the ordered procedure, I authorize the administration of contrast media per Radiology protocol    Answer:   Yes    Order Specific Question:   What is the patient's sedation requirement?    Answer:   No Sedation    Order Specific Question:   Does the patient have a pacemaker or implanted devices?    Answer:   No    Order Specific Question:   Use SRS Protocol?    Answer:   No    Order Specific Question:   Preferred imaging location?    Answer:   [facility] (table limit - 550lbs)   ECHOCARDIOGRAM COMPLETE    Standing Status:   Future    Standing Expiration Date:   08/29/2022    Order Specific Question:   Where should this test be performed    Answer:   [facility]    Order Specific Question:   Perflutren DEFINITY (image enhancing agent) should be administered unless hypersensitivity or allergy exist    Answer:   Administer Perflutren    Order Specific  Question:   Reason for exam-Echo    Answer:   Chemo  Z09    All questions were answered. The patient knows to call the clinic with any problems questions or concerns.  Return of visit: TBD  Earlie Server, MD, PhD [facility] Hematology Oncology 08/28/2021     CHIEF COMPLAINTS/REASON FOR VISIT:  Follow-up for breast cancer  HISTORY OF PRESENTING ILLNESS:  Vanessa Fisher is a  61 y.o.  female with PMH listed below who was referred to me for evaluation of  left breast mass SUMMARY OF ONCOLOGIC HISTORY: Oncology History  Breast cancer ([place])  07/31/2021 Imaging   Bilateral diagnostic mammogram and US showed 5  centimeter LEFT breast mass associated with pleomorphic calcifications is suspicious for invasive ductal carcinoma.At least 4 LEFT axillary lymph nodes with abnormal morphology   08/21/2021 Cancer Staging   Staging form: Breast, AJCC 8th Edition - Clinical: Stage IIIA (cT3, cN1, cM0, G3, ER-, PR-, HER2+) - Signed by Earlie Server, MD on 08/28/2021 Histologic grading system: 3 grade system  -08/21/21 left breast ultrasound-guided biopsy showed invasive mammary carcinoma, grade 3, ER/PR negative, HER2 positive.  Left axillary lymph node biopsy positive for macro metastatic mammary carcinoma, 8 mm in greatest extent.    Patient present to review pathology results and management plan. She continues to have sharp remittent left breast pain.  Patient follows up with pain clinic for chronic pain syndrome. She has oxycodone acetaminophen 10/325 mg tablets, reports not helping her pain.  She cannot tolerate tramadol, NSAIDs gabapentin. Intermittent headache, no nausea vomiting. Memory loss/confusion intermittently. Daughter was called during the encounter.   MEDICAL HISTORY:  Past Medical History:  Diagnosis Date   Anemia    Anxiety    Arthritis    Asthma    WELL CONTROLLED   Bile acid malabsorption syndrome    Bradycardia    HAD AN ISSUE WITH THIS IN 2016-NO PROBLEMS SINCE   Depression    Diabetes mellitus without complication (HCC)    Diverticulitis    Gastric reflux    GERD (gastroesophageal reflux disease)    Hand pain 05/12/2016   Headache    H/O MIGRAINES   History of kidney stones    H/O   Neck pain, chronic    Panic attacks     SURGICAL HISTORY: Past Surgical History:  Procedure Laterality Date   BREAST BIOPSY Left 08/21/2021   Axilla Bx, Hydromarker, path pending   BREAST BIOPSY Left 08/21/2021   [place] Bx, Ribbon Clip, Path Pending   CARPAL TUNNEL RELEASE Right 12/06/2017   Procedure: CARPAL TUNNEL RELEASE;  Surgeon: Earnestine Leys, MD;  Location: [facility] ORS;  Service:  Orthopedics;  Laterality: Right;   COLONOSCOPY WITH PROPOFOL N/A 06/11/2021   Procedure: COLONOSCOPY WITH PROPOFOL;  Surgeon: Lin Landsman, MD;  Location: [facility] ENDOSCOPY;  Service: Gastroenterology;  Laterality: N/A;   DORSAL COMPARTMENT RELEASE Right 12/06/2017   Procedure: RELEASE DORSAL COMPARTMENT (DEQUERVAIN);  Surgeon: Earnestine Leys, MD;  Location: [facility] ORS;  Service: Orthopedics;  Laterality: Right;   ESOPHAGOGASTRODUODENOSCOPY (EGD) WITH PROPOFOL N/A 06/11/2021   Procedure: ESOPHAGOGASTRODUODENOSCOPY (EGD) WITH PROPOFOL;  Surgeon: Lin Landsman, MD;  Location: [facility] ENDOSCOPY;  Service: Gastroenterology;  Laterality: N/A;   PARTIAL HYSTERECTOMY     TUBAL LIGATION      SOCIAL HISTORY: Social History   Socioeconomic History   Marital status: Single    Spouse name: Not on file   Number of children: Not on file   Years of education: Not on  file   Highest education level: Not on file  Occupational History   Not on file  Tobacco Use   Smoking status: Never   Smokeless tobacco: Never  Vaping Use   Vaping Use: Never used  Substance and Sexual Activity   Alcohol use: No   Drug use: No   Sexual activity: Not on file  Other Topics Concern   Not on file  Social History Narrative   Not on file   Social Determinants of Health   Financial Resource Strain: High Risk (08/15/2021)   Overall Financial Resource Strain (CARDIA)    Difficulty of Paying Living Expenses: Very hard  Food Insecurity: Food Insecurity Present (08/15/2021)   Hunger Vital Sign    Worried About Running Out of Food in the Last Year: Sometimes true    Ran Out of Food in the Last Year: Never true  Transportation Needs: No Transportation Needs (08/15/2021)   PRAPARE - Hydrologist (Medical): No    Lack of Transportation (Non-Medical): No  Physical Activity: Inactive (08/15/2021)   Exercise Vital Sign    Days of Exercise per Week: 0 days    Minutes of Exercise per Session:  0 min  Stress: Stress Concern Present (08/15/2021)   [place]    Feeling of Stress : Rather much  Social Connections: Socially Isolated (08/15/2021)   Social Connection and Isolation Panel [NHANES]    Frequency of Communication with Friends and Family: Once a week    Frequency of Social Gatherings with Friends and Family: Once a week    Attends Religious Services: Never    Marine scientist or Organizations: No    Attends Music therapist: Not on file    Marital Status: Never married  Intimate Partner Violence: Not At Risk (08/15/2021)   Humiliation, Afraid, Rape, and Kick questionnaire    Fear of Current or Ex-Partner: No    Emotionally Abused: No    Physically Abused: No    Sexually Abused: No    FAMILY HISTORY: Family History  Problem Relation Age of Onset   Diabetes Mother    Hypertension Mother    Heart failure Mother    Cancer Mother    Diabetes Father    Hypertension Father    Breast cancer Maternal Aunt     ALLERGIES:  is allergic to bc powder [aspirin-salicylamide-caffeine], gabapentin, hydrocodone-acetaminophen, latex, penicillin g, penicillins, and shrimp [shellfish allergy].  MEDICATIONS:  Current Outpatient Medications  Medication Sig Dispense Refill   albuterol (PROVENTIL) (2.5 MG/3ML) 0.083% nebulizer solution Take 3 mLs (2.5 mg total) by nebulization every 4 (four) hours as needed for wheezing or shortness of breath. 75 mL 2   albuterol (VENTOLIN HFA) 108 (90 Base) MCG/ACT inhaler Inhale 2 puffs into the lungs every 6 (six) hours as needed for wheezing or shortness of breath. 18 g 0   aspirin 81 MG chewable tablet Chew 81 mg by mouth daily.     cetirizine-pseudoephedrine (ZYRTEC-D) 5-120 MG tablet Take 1 tablet by mouth daily. 30 tablet 0   DULoxetine (CYMBALTA) 20 MG capsule Take 1 capsule by mouth daily.     FLOVENT DISKUS 250 MCG/ACT AEPB Inhale into the lungs.      fluticasone (FLONASE) 50 MCG/ACT nasal spray Place 2 sprays into both nostrils daily. 15.8 mL 0   glimepiride (AMARYL) 2 MG tablet Take 2 mg by mouth daily.  2   lisinopril-hydrochlorothiazide (ZESTORETIC) 10-12.5 MG tablet  Take 1 tablet by mouth daily.     montelukast (SINGULAIR) 10 MG tablet Take by mouth.     naloxone (NARCAN) nasal spray 4 mg/0.1 mL SMARTSIG:Both Nares     ondansetron (ZOFRAN-ODT) 8 MG disintegrating tablet Take 1 tablet (8 mg total) by mouth every 8 (eight) hours as needed for nausea or vomiting. 12 tablet 0   oxyCODONE-acetaminophen (PERCOCET) 10-325 MG tablet Take 1 tablet by mouth 3 (three) times daily as needed.     pantoprazole (PROTONIX) 40 MG tablet Take 40 mg by mouth daily.     phenazopyridine (PYRIDIUM) 200 MG tablet Take 1 tablet (200 mg total) by mouth 3 (three) times daily. 6 tablet 0   triamcinolone cream (KENALOG) 0.1 % Apply 1 application topically 2 (two) times daily as needed. 30 g 0   TRULICITY 3 RA/0.7MA SOPN SMARTSIG:3 Milligram(s) SUB-Q Once a Week     metoCLOPramide (REGLAN) 5 MG tablet Take 1 tablet (5 mg total) by mouth 4 (four) times daily -  before meals and at bedtime. 120 tablet 0   No current facility-administered medications for this visit.    Review of Systems  Constitutional:  Negative for appetite change, chills, fatigue and fever.  HENT:   Negative for hearing loss and voice change.   Eyes:  Negative for eye problems.  Respiratory:  Negative for chest tightness and cough.   Cardiovascular:  Negative for chest pain.  Gastrointestinal:  Negative for abdominal distention, abdominal pain and blood in stool.  Endocrine: Negative for hot flashes.  Genitourinary:  Negative for difficulty urinating and frequency.   Musculoskeletal:  Positive for arthralgias and back pain.  Skin:  Negative for itching and rash.  Neurological:  Negative for extremity weakness.  Hematological:  Negative for adenopathy.  Psychiatric/Behavioral:  Negative for  confusion.   Left breast pain  PHYSICAL EXAMINATION: ECOG PERFORMANCE STATUS: 1 - Symptomatic but completely ambulatory Vitals:   08/28/21 1357  BP: 116/73  Pulse: 86  Temp: (!) 97.3 F (36.3 C)   Filed Weights   08/28/21 1357  Weight: 215 lb (97.5 kg)   Physical Exam Constitutional:      General: She is not in acute distress. HENT:     Head: Normocephalic and atraumatic.  Eyes:     General: No scleral icterus. Cardiovascular:     Rate and Rhythm: Normal rate and regular rhythm.     Heart sounds: Normal heart sounds.  Pulmonary:     Effort: Pulmonary effort is normal. No respiratory distress.     Breath sounds: No wheezing.  Abdominal:     General: Bowel sounds are normal. There is no distension.     Palpations: Abdomen is soft.  Musculoskeletal:        General: No deformity. Normal range of motion.     Cervical back: Normal range of motion and neck supple.  Skin:    General: Skin is warm and dry.     Findings: No erythema or rash.  Neurological:     Mental Status: She is alert and oriented to person, place, and time. Mental status is at baseline.     Cranial Nerves: No cranial nerve deficit.     Coordination: Coordination normal.  Psychiatric:        Mood and Affect: Mood normal.   Gross examination reviewed palpable fixed left breast firm mass, no nipple discharge, skin is intact.  No appreciable mass in the right breast.  No appreciable lymphadenopathy in the axilla bilaterally.  LABORATORY DATA:  I have reviewed the data as listed Lab Results  Component Value Date   WBC 5.3 07/18/2021   HGB 12.0 07/18/2021   HCT 38.1 07/18/2021   MCV 82.1 07/18/2021   PLT 301 07/18/2021   Lab Results  Component Value Date   NA 138 07/18/2021   K 3.7 07/18/2021   CL 105 07/18/2021   CO2 25 07/18/2021    RADIOGRAPHIC STUDIES: I have personally reviewed the radiological images as listed and agreed with the findings in the report. [place] AXILLARY NODE CORE BIOPSY  LEFT  Addendum Date: 08/25/2021   ADDENDUM REPORT: 08/25/2021 14:30 ADDENDUM: PATHOLOGY revealed: Site A. BREAST, LEFT AT 5:00, RETROAREOLAR; ULTRASOUND-GUIDED CORE NEEDLE BIOPSY: - INVASIVE MAMMARY CARCINOMA, NO SPECIAL TYPE. Size of invasive carcinoma: 15 mm in this sample. Grade 3. Ductal carcinoma in situ: Present, high-grade with comedonecrosis and calcifications. Lymphovascular invasion: Not identified. Pathology results are CONCORDANT with imaging findings, per Dr. Zerita Boers. PATHOLOGY revealed: Site B. 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 Pathology results are CONCORDANT with imaging findings, per Dr. Zerita Boers. Pathology results and recommendations below were discussed with patient by telephone on 08/25/2021. Patient reported biopsy site within normal limits with slight tenderness at the site. Post biopsy care instructions were reviewed, questions were answered and my direct phone number was provided to patient. Patient was instructed to call [facility] if any concerns or questions arise related to the biopsy. RECOMMENDATIONS: Surgical and oncological consultation. Request for surgical and oncological consultation relayed to Casper Harrison RN at [facility] by Electa Sniff RN on 08/25/2021. Pathology results reported by Electa Sniff RN on 08/25/2021. Electronically Signed   By: Valentino Saxon M.D.   On: 08/25/2021 14:30   Result Date: 08/25/2021 CLINICAL DATA:  Here for ultrasound-guided two-site biopsy of a mass in the left breast and a lymph node in the left axilla. EXAM: ULTRASOUND GUIDED LEFT BREAST CORE NEEDLE BIOPSY ULTRASOUND-GUIDED LEFT AXILLARY LYMPH NODE BIOPSY COMPARISON:  Previous exam(s). PROCEDURE: I met with the patient and we discussed the procedure of ultrasound-guided biopsy, including benefits and alternatives. We discussed the high likelihood of a  successful procedure. We discussed the risks of the procedure, including infection, bleeding, tissue injury, clip migration, and inadequate sampling. Informed written consent was given. The usual time-out protocol was performed immediately prior to the procedure. Site 1- Lesion quadrant: Lower outer quadrant Using sterile technique and 1% Lidocaine as local anesthetic, under direct ultrasound visualization, a 12 gauge spring-loaded device was used to perform biopsy of an irregular hypoechoic mass in the retroareolar left breast at 5 o'clock using a lateral approach. At the conclusion of the procedure a ribbon shaped tissue marker clip was deployed into the biopsy cavity. Follow up 2 view mammogram was performed and dictated separately. Site 2- Lesion quadrant: Lower outer quadrant Using sterile technique and 1% Lidocaine as local anesthetic, under direct ultrasound visualization, a 12 gauge spring-loaded device was used to perform biopsy of an enlarged left axillary lymph node using a lateral approach. At the conclusion of the procedure an open coil shaped tissue marker clip was deployed into the biopsy cavity. Follow up 2 view mammogram was performed and dictated separately. IMPRESSION: Ultrasound guided two-site biopsy of a mass in the left breast and a lymph node in the left axilla. No apparent complications. Electronically Signed: By: Zerita Boers M.D. On: 08/21/2021 10:58   [place] LT  BREAST BX W LOC DEV 1ST LESION IMG BX SPEC US GUIDE  Addendum Date: 08/25/2021   ADDENDUM REPORT: 08/25/2021 14:30 ADDENDUM: PATHOLOGY revealed: Site A. BREAST, LEFT AT 5:00, RETROAREOLAR; ULTRASOUND-GUIDED CORE NEEDLE BIOPSY: - INVASIVE MAMMARY CARCINOMA, NO SPECIAL TYPE. Size of invasive carcinoma: 15 mm in this sample. Grade 3. Ductal carcinoma in situ: Present, high-grade with comedonecrosis and calcifications. Lymphovascular invasion: Not identified. Pathology results are CONCORDANT with imaging findings, per Dr. Zerita Boers. PATHOLOGY revealed: Site B. 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 Pathology results are CONCORDANT with imaging findings, per Dr. Zerita Boers. Pathology results and recommendations below were discussed with patient by telephone on 08/25/2021. Patient reported biopsy site within normal limits with slight tenderness at the site. Post biopsy care instructions were reviewed, questions were answered and my direct phone number was provided to patient. Patient was instructed to call [facility] if any concerns or questions arise related to the biopsy. RECOMMENDATIONS: Surgical and oncological consultation. Request for surgical and oncological consultation relayed to Casper Harrison RN at [facility] by Electa Sniff RN on 08/25/2021. Pathology results reported by Electa Sniff RN on 08/25/2021. Electronically Signed   By: Valentino Saxon M.D.   On: 08/25/2021 14:30   Result Date: 08/25/2021 CLINICAL DATA:  Here for ultrasound-guided two-site biopsy of a mass in the left breast and a lymph node in the left axilla. EXAM: ULTRASOUND GUIDED LEFT BREAST CORE NEEDLE BIOPSY ULTRASOUND-GUIDED LEFT AXILLARY LYMPH NODE BIOPSY COMPARISON:  Previous exam(s). PROCEDURE: I met with the patient and we discussed the procedure of ultrasound-guided biopsy, including benefits and alternatives. We discussed the high likelihood of a successful procedure. We discussed the risks of the procedure, including infection, bleeding, tissue injury, clip migration, and inadequate sampling. Informed written consent was given. The usual time-out protocol was performed immediately prior to the procedure. Site 1- Lesion quadrant: Lower outer quadrant Using sterile technique and 1% Lidocaine as local anesthetic, under direct ultrasound visualization, a 12 gauge spring-loaded device was used to perform biopsy of an irregular  hypoechoic mass in the retroareolar left breast at 5 o'clock using a lateral approach. At the conclusion of the procedure a ribbon shaped tissue marker clip was deployed into the biopsy cavity. Follow up 2 view mammogram was performed and dictated separately. Site 2- Lesion quadrant: Lower outer quadrant Using sterile technique and 1% Lidocaine as local anesthetic, under direct ultrasound visualization, a 12 gauge spring-loaded device was used to perform biopsy of an enlarged left axillary lymph node using a lateral approach. At the conclusion of the procedure an open coil shaped tissue marker clip was deployed into the biopsy cavity. Follow up 2 view mammogram was performed and dictated separately. IMPRESSION: Ultrasound guided two-site biopsy of a mass in the left breast and a lymph node in the left axilla. No apparent complications. Electronically Signed: By: Zerita Boers M.D. On: 08/21/2021 10:58   MM CLIP PLACEMENT LEFT  Result Date: 08/21/2021 CLINICAL DATA:  Status post ultrasound-guided biopsy of a mass in the left breast and a lymph node in the left axilla. EXAM: 3D DIAGNOSTIC LEFT MAMMOGRAM POST ULTRASOUND BIOPSY COMPARISON:  Previous exam(s). FINDINGS: 3D Mammographic images were obtained following ultrasound guided biopsy of a mass in the left breast and a lymph node in the left axilla. The biopsy marking clips are in expected position at the sites of biopsy. IMPRESSION: Appropriate positioning of the ribbon shaped  biopsy marking clip at the site of biopsy in the left breast as well as the open coil shaped biopsy marking clip at the site of biopsy in the left axilla. Final Assessment: Post Procedure Mammograms for Marker Placement Electronically Signed   By: Zerita Boers M.D.   On: 08/21/2021 12:57  MM DIAG BREAST TOMO BILATERAL  Result Date: 07/31/2021 CLINICAL DATA:  LEFT breast mass. Patient reports mass was first noted 6 weeks ago. Patient is seen in the emergency department on 07/14/2021  for evaluation of the same. Ultrasound at that time showed an irregular mass in the 3 o'clock location of the LEFT breast measuring at least 5.3 centimeters. EXAM: DIGITAL DIAGNOSTIC BILATERAL MAMMOGRAM WITH TOMOSYNTHESIS AND CAD; ULTRASOUND LEFT BREAST LIMITED TECHNIQUE: Bilateral digital diagnostic mammography and breast tomosynthesis was performed. The images were evaluated with computer-aided detection.; Targeted ultrasound examination of the left breast was performed. COMPARISON:  07/14/2021 ACR Breast Density Category b: There are scattered areas of fibroglandular density. FINDINGS: RIGHT breast is negative. There is diffuse asymmetric density throughout the central portion of the LEFT breast. Pleomorphic calcifications and significant distortion are identified within the asymmetry correlating to the area of palpable concern. Skin is thickened along the inferior MEDIAL aspects of the LEFT breast. Partially imaged enlarged LEFT axillary lymph node also noted. On physical exam, there is a firm mass in the central portion of the LEFT breast, best palpated along the LATERAL and LOWER portions of the breast. The patient is tender on physical exam. There is deformity of the nipple. The skin in the LOWER portion of the LEFT breast is thickened. However, there is no erythema. Targeted ultrasound is performed, showing an irregular hypoechoic mass in the retroareolar region of the LEFT breast, spanning at least 4.9 x 2.2 x 3.9 centimeters. Mass is vascular on Doppler evaluation. Evaluation of the LEFT axilla shows at least 4 lymph nodes with abnormal morphology. IMPRESSION: 1. RIGHT breast is negative. 2. 5 centimeter LEFT breast mass associated with pleomorphic calcifications is suspicious for invasive ductal carcinoma. 3. At least 4 LEFT axillary lymph nodes with abnormal morphology. RECOMMENDATION: Ultrasound-guided core biopsy of LEFT breast mass. Ultrasound-guided core biopsy of 1 of the enlarged LEFT axillary  lymph nodes. I have discussed the findings and recommendations with the patient. If applicable, a reminder letter will be sent to the patient regarding the next appointment. BI-RADS CATEGORY  5: Highly suggestive of malignancy. Electronically Signed   By: Nolon Nations M.D.   On: 07/31/2021 16:54  US BREAST LTD UNI LEFT INC AXILLA  Result Date: 07/31/2021 CLINICAL DATA:  LEFT breast mass. Patient reports mass was first noted 6 weeks ago. Patient is seen in the emergency department on 07/14/2021 for evaluation of the same. Ultrasound at that time showed an irregular mass in the 3 o'clock location of the LEFT breast measuring at least 5.3 centimeters. EXAM: DIGITAL DIAGNOSTIC BILATERAL MAMMOGRAM WITH TOMOSYNTHESIS AND CAD; ULTRASOUND LEFT BREAST LIMITED TECHNIQUE: Bilateral digital diagnostic mammography and breast tomosynthesis was performed. The images were evaluated with computer-aided detection.; Targeted ultrasound examination of the left breast was performed. COMPARISON:  07/14/2021 ACR Breast Density Category b: There are scattered areas of fibroglandular density. FINDINGS: RIGHT breast is negative. There is diffuse asymmetric density throughout the central portion of the LEFT breast. Pleomorphic calcifications and significant distortion are identified within the asymmetry correlating to the area of palpable concern. Skin is thickened along the inferior MEDIAL aspects of the LEFT breast. Partially imaged enlarged LEFT axillary lymph node  also noted. On physical exam, there is a firm mass in the central portion of the LEFT breast, best palpated along the LATERAL and LOWER portions of the breast. The patient is tender on physical exam. There is deformity of the nipple. The skin in the LOWER portion of the LEFT breast is thickened. However, there is no erythema. Targeted ultrasound is performed, showing an irregular hypoechoic mass in the retroareolar region of the LEFT breast, spanning at least 4.9 x 2.2 x  3.9 centimeters. Mass is vascular on Doppler evaluation. Evaluation of the LEFT axilla shows at least 4 lymph nodes with abnormal morphology. IMPRESSION: 1. RIGHT breast is negative. 2. 5 centimeter LEFT breast mass associated with pleomorphic calcifications is suspicious for invasive ductal carcinoma. 3. At least 4 LEFT axillary lymph nodes with abnormal morphology. RECOMMENDATION: Ultrasound-guided core biopsy of LEFT breast mass. Ultrasound-guided core biopsy of 1 of the enlarged LEFT axillary lymph nodes. I have discussed the findings and recommendations with the patient. If applicable, a reminder letter will be sent to the patient regarding the next appointment. BI-RADS CATEGORY  5: Highly suggestive of malignancy. Electronically Signed   By: Nolon Nations M.D.   On: 07/31/2021 16:54

## 2021-08-28 NOTE — Assessment & Plan Note (Addendum)
Left breast cancer, cT3 N1 Mx, ER/PR-, HER2 + Imaging findings and pathology reports were reviewed and discussed with patient I recommend patient to proceed with PET scan and MRI brain with and without contrast to finish staging. If patient does not have any distant metastasis, I recommend neoadjuvant chemotherapy with TCHP followed by surgery. The diagnosis and care plan were discussed with patient in detail.  The goal of treatment will be curative.   Chemotherapy education was provided.  I explained to the patient the risks and benefits of chemotherapy including all but not limited to hair loss, mouth sore, nausea, vomiting, diarrhea, low blood counts, bleeding, neuropathy, heart failure and risk of life threatening infection and even death, secondary malignancy etc.  Patient voices understanding and willing to proceed chemotherapy.   If patient has developed metastatic disease, recommend trastuzumab, pertuzumab, and a taxane  # Chemotherapy education; discussed about option of Medi- port placement.  Check 2D echo.  Patient prefers to use peripheral vein access. Antiemetics-Zofran and Compazine;

## 2021-08-28 NOTE — Progress Notes (Signed)
Accompanied patient to medical oncology visit.  Appointments pending for PET scan, MRI, chemo education.

## 2021-08-30 ENCOUNTER — Ambulatory Visit
Admission: RE | Admit: 2021-08-30 | Discharge: 2021-08-30 | Disposition: A | Discharge status: Home / Self Care | Payer: Medicare HMO | Source: Ambulatory Visit | Attending: Oncology | Admitting: Oncology

## 2021-08-30 ENCOUNTER — Encounter: Payer: Self-pay | Admitting: Oncology

## 2021-08-30 DIAGNOSIS — C50919 Malignant neoplasm of unspecified site of unspecified female breast: Secondary | ICD-10-CM | POA: Diagnosis not present

## 2021-08-30 DIAGNOSIS — J3489 Other specified disorders of nose and nasal sinuses: Secondary | ICD-10-CM | POA: Diagnosis not present

## 2021-08-30 DIAGNOSIS — N6325 Unspecified lump in the left breast, overlapping quadrants: Secondary | ICD-10-CM | POA: Insufficient documentation

## 2021-08-30 MED ORDER — ONDANSETRON HCL 8 MG PO TABS: 8.0000 mg | ORAL_TABLET | Freq: Two times a day (BID) | ORAL | 1 refills | Status: DC | PRN

## 2021-08-30 MED ORDER — GADOBUTROL 1 MMOL/ML IV SOLN
9.0000 mL | Freq: Once | INTRAVENOUS | Status: AC | PRN
  Administered 2021-08-30: 10 mL via INTRAVENOUS

## 2021-08-30 MED ORDER — DEXAMETHASONE 4 MG PO TABS: 8.0000 mg | ORAL_TABLET | Freq: Every day | ORAL | 1 refills | Status: DC

## 2021-08-30 MED ORDER — PROCHLORPERAZINE MALEATE 10 MG PO TABS: 10.0000 mg | ORAL_TABLET | Freq: Four times a day (QID) | ORAL | 1 refills | Status: DC | PRN

## 2021-08-30 NOTE — Assessment & Plan Note (Signed)
She follows with pain clinic.

## 2021-08-30 NOTE — Assessment & Plan Note (Signed)
Discussed with patient

## 2021-09-01 ENCOUNTER — Telehealth: Payer: Self-pay

## 2021-09-01 NOTE — Telephone Encounter (Signed)
Please see if patient's PET can be move up. Arrange for patient chemo class TCHP.  Lab/MD/TCHP + D3 udenyca 1-2 days after PET. Please notify patient of appt.

## 2021-09-01 NOTE — Telephone Encounter (Signed)
-----   Message from Earlie Server, MD sent at 08/30/2021  7:29 PM EDT ----- Please check if PET can be moved earlier.  Arrange her to have chemo class TCHP,  lab MD TCHP + D3 udenyca 1-2 days after PET thanks

## 2021-09-02 ENCOUNTER — Inpatient Hospital Stay: Payer: Medicare HMO | Attending: Oncology | Admitting: Hospice and Palliative Medicine

## 2021-09-02 ENCOUNTER — Telehealth: Payer: Self-pay | Admitting: *Deleted

## 2021-09-02 ENCOUNTER — Encounter: Payer: Self-pay | Admitting: Hospice and Palliative Medicine

## 2021-09-02 ENCOUNTER — Encounter: Payer: Self-pay | Admitting: Oncology

## 2021-09-02 VITALS — BP 132/69 | HR 57 | Temp 96.8°F | Resp 16 | Wt 216.0 lb

## 2021-09-02 DIAGNOSIS — I808 Phlebitis and thrombophlebitis of other sites: Secondary | ICD-10-CM | POA: Insufficient documentation

## 2021-09-02 DIAGNOSIS — Z5111 Encounter for antineoplastic chemotherapy: Secondary | ICD-10-CM | POA: Insufficient documentation

## 2021-09-02 DIAGNOSIS — R110 Nausea: Secondary | ICD-10-CM | POA: Diagnosis not present

## 2021-09-02 DIAGNOSIS — N6325 Unspecified lump in the left breast, overlapping quadrants: Secondary | ICD-10-CM

## 2021-09-02 DIAGNOSIS — C50812 Malignant neoplasm of overlapping sites of left female breast: Secondary | ICD-10-CM | POA: Diagnosis not present

## 2021-09-02 DIAGNOSIS — Z5112 Encounter for antineoplastic immunotherapy: Secondary | ICD-10-CM | POA: Diagnosis not present

## 2021-09-02 DIAGNOSIS — Z171 Estrogen receptor negative status [ER-]: Secondary | ICD-10-CM | POA: Diagnosis not present

## 2021-09-02 DIAGNOSIS — Z5189 Encounter for other specified aftercare: Secondary | ICD-10-CM | POA: Diagnosis not present

## 2021-09-02 NOTE — Telephone Encounter (Signed)
Left msg with [facility]- for Dr. Nancy Fetter to call Merrily Pew, NP regarding this patient.

## 2021-09-02 NOTE — Telephone Encounter (Signed)
Pt returned my phone call. Discussed with patient and her family member that [place] spoke with Dr. Nancy Fetter, who recommended that patient that her percocet an additional time per day. Pt should keep her pain mgmt apt as planned with Dr. Nancy Fetter. Pt stated that she was uneasy taking any additional pain medication w/o Dr. Nancy Fetter directly making the adjustment. I reiterated that the adjustment was discussed with Merrily Pew, NP and recommended by Dr. Nancy Fetter to increase her percocet. Pt stated that she would rather hear this from Dr. Nancy Fetter directly and doesn't want to "risk loosing her pain contract."

## 2021-09-02 NOTE — Telephone Encounter (Signed)
Per Merrily Pew, NP. Provider spoke with Dr. Nancy Fetter at [facility]. Dr. Nancy Fetter recommended that patient take an extra percocet daily until Dr. Nancy Fetter sees the patient at the next apt. Dr. Nancy Fetter will be managing pt's pain.  I attempted to reach patient to discuss this plan w/ the patient. No answer. Vm box not set up- unable to leave msg for patient.

## 2021-09-02 NOTE — Progress Notes (Signed)
 Symptom Management and Supportive Care [facility] [phone] [phone]  Patient Care Team: System, Provider Not In as PCP - General Moore, Ana K, RN as Oncology Nurse Navigator   NAME OF PATIENT: Vanessa Fisher  [id number]  03/22/1960   DATE OF VISIT: 09/02/21  REASON FOR CONSULT: Vanessa Fisher is a 61 y.o. 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   INTERVAL HISTORY: Patient is completing work-up for newly diagnosed breast cancer.  She is pending PET scan.  Patient has chronic back pain and has been followed years by [facility] in [place].  She was followed by Dr. Chris at [facility] prior to that.  She is on chronic Percocet but reports that she has had worsened left breast pain since her diagnosis of cancer.  She denies other symptomatic complaints.  Denies any neurologic complaints. Denies recent fevers or illnesses. Denies any easy bleeding or bruising. Reports good appetite and denies weight loss. Denies chest pain. Denies any nausea, vomiting, constipation, or diarrhea. Denies urinary complaints. Patient offers no further specific complaints today.  SOCIAL HISTORY:     reports that she has never smoked. She has never used smokeless tobacco. She reports that she does not drink alcohol and does not use drugs.  Patient is unmarried.  She lives at home alone.  Her oldest son died of cancer.  She has a son and daughter who live nearby.  She also has a sister who is involved.  Patient previously worked at [employer].  ADVANCE DIRECTIVES:  Does not have  CODE STATUS:   PAST MEDICAL HISTORY: Past Medical History:  Diagnosis Date   Anemia    Anxiety    Arthritis    Asthma    WELL CONTROLLED   Bile acid malabsorption syndrome    Bradycardia    HAD AN ISSUE WITH THIS IN 2016-NO PROBLEMS SINCE   Depression    Diabetes mellitus  without complication (HCC)    Diverticulitis    Gastric reflux    GERD (gastroesophageal reflux disease)    Hand pain 05/12/2016   Headache    H/O MIGRAINES   History of kidney stones    H/O   Neck pain, chronic    Panic attacks     PAST SURGICAL HISTORY:  Past Surgical History:  Procedure Laterality Date   BREAST BIOPSY Left 08/21/2021   Axilla Bx, Hydromarker, path pending   BREAST BIOPSY Left 08/21/2021   US Bx, Ribbon Clip, Path Pending   CARPAL TUNNEL RELEASE Right 12/06/2017   Procedure: CARPAL TUNNEL RELEASE;  Surgeon: Miller, Howard, MD;  Location: [facility] ORS;  Service: Orthopedics;  Laterality: Right;   COLONOSCOPY WITH PROPOFOL N/A 06/11/2021   Procedure: COLONOSCOPY WITH PROPOFOL;  Surgeon: Vanga, Rohini Reddy, MD;  Location: [facility] ENDOSCOPY;  Service: Gastroenterology;  Laterality: N/A;   DORSAL COMPARTMENT RELEASE Right 12/06/2017   Procedure: RELEASE DORSAL COMPARTMENT (DEQUERVAIN);  Surgeon: Miller, Howard, MD;  Location: [facility] ORS;  Service: Orthopedics;  Laterality: Right;   ESOPHAGOGASTRODUODENOSCOPY (EGD) WITH PROPOFOL N/A 06/11/2021   Procedure: ESOPHAGOGASTRODUODENOSCOPY (EGD) WITH PROPOFOL;  Surgeon: Vanga, Rohini Reddy, MD;  Location: [facility] ENDOSCOPY;  Service: Gastroenterology;  Laterality: N/A;   PARTIAL HYSTERECTOMY     TUBAL LIGATION      HEMATOLOGY/ONCOLOGY HISTORY:  Oncology History  Breast cancer (HCC)  07/31/2021 Imaging   Bilateral diagnostic mammogram and US showed 5 centimeter LEFT breast mass associated with pleomorphic   calcifications is suspicious for invasive ductal carcinoma.At least 4 LEFT axillary lymph nodes with abnormal morphology   08/21/2021 Cancer Staging   Staging form: Breast, AJCC 8th Edition - Clinical: Stage IIIA (cT3, cN1, cM0, G3, ER-, PR-, HER2+) - Signed by Yu, Zhou, MD on 08/28/2021 Histologic grading system: 3 grade system  -08/21/21 left breast ultrasound-guided biopsy showed invasive mammary carcinoma, grade 3, ER/PR  negative, HER2 positive.  Left axillary lymph node biopsy positive for macro metastatic mammary carcinoma, 8 mm in greatest extent.   08/31/2021 -  Chemotherapy   Patient is on Treatment Plan : BREAST  Docetaxel + Carboplatin + Trastuzumab + Pertuzumab  (TCHP) q21d        ALLERGIES:  is allergic to bc powder [aspirin-salicylamide-caffeine], gabapentin, hydrocodone-acetaminophen, latex, penicillin g, penicillins, and shrimp [shellfish allergy].  MEDICATIONS:  Current Outpatient Medications  Medication Sig Dispense Refill   albuterol (PROVENTIL) (2.5 MG/3ML) 0.083% nebulizer solution Take 3 mLs (2.5 mg total) by nebulization every 4 (four) hours as needed for wheezing or shortness of breath. 75 mL 2   albuterol (VENTOLIN HFA) 108 (90 Base) MCG/ACT inhaler Inhale 2 puffs into the lungs every 6 (six) hours as needed for wheezing or shortness of breath. 18 g 0   aspirin 81 MG chewable tablet Chew 81 mg by mouth daily.     cetirizine-pseudoephedrine (ZYRTEC-D) 5-120 MG tablet Take 1 tablet by mouth daily. 30 tablet 0   dexamethasone (DECADRON) 4 MG tablet Take 2 tablets (8 mg total) by mouth daily. Start the day before Taxotere. Then take daily x 2 days after chemotherapy. 30 tablet 1   DULoxetine (CYMBALTA) 20 MG capsule Take 1 capsule by mouth daily.     FLOVENT DISKUS 250 MCG/ACT AEPB Inhale into the lungs.     fluticasone (FLONASE) 50 MCG/ACT nasal spray Place 2 sprays into both nostrils daily. 15.8 mL 0   glimepiride (AMARYL) 2 MG tablet Take 2 mg by mouth daily.  2   lisinopril-hydrochlorothiazide (ZESTORETIC) 10-12.5 MG tablet Take 1 tablet by mouth daily.     metoCLOPramide (REGLAN) 5 MG tablet Take 1 tablet (5 mg total) by mouth 4 (four) times daily -  before meals and at bedtime. 120 tablet 0   montelukast (SINGULAIR) 10 MG tablet Take by mouth.     naloxone (NARCAN) nasal spray 4 mg/0.1 mL SMARTSIG:Both Nares     ondansetron (ZOFRAN) 8 MG tablet Take 1 tablet (8 mg total) by mouth 2  (two) times daily as needed (Nausea or vomiting). Start on the third day after chemotherapy. 30 tablet 1   ondansetron (ZOFRAN-ODT) 8 MG disintegrating tablet Take 1 tablet (8 mg total) by mouth every 8 (eight) hours as needed for nausea or vomiting. 12 tablet 0   oxyCODONE-acetaminophen (PERCOCET) 10-325 MG tablet Take 1 tablet by mouth 3 (three) times daily as needed.     pantoprazole (PROTONIX) 40 MG tablet Take 40 mg by mouth daily.     phenazopyridine (PYRIDIUM) 200 MG tablet Take 1 tablet (200 mg total) by mouth 3 (three) times daily. 6 tablet 0   prochlorperazine (COMPAZINE) 10 MG tablet Take 1 tablet (10 mg total) by mouth every 6 (six) hours as needed (Nausea or vomiting). 30 tablet 1   triamcinolone cream (KENALOG) 0.1 % Apply 1 application topically 2 (two) times daily as needed. 30 g 0   TRULICITY 3 MG/0.5ML SOPN SMARTSIG:3 Milligram(s) SUB-Q Once a Week     No current facility-administered medications for this visit.      VITAL SIGNS: There were no vitals taken for this visit. There were no vitals filed for this visit.  Estimated body mass index is 34.7 kg/m as calculated from the following:   Height as of 07/24/21: 5' 6" (1.676 m).   Weight as of 08/28/21: 215 lb (97.5 kg).  LABS: CBC:    Component Value Date/Time   WBC 5.3 07/18/2021 1201   HGB 12.0 07/18/2021 1201   HGB 11.9 (L) 05/27/2014 2215   HCT 38.1 07/18/2021 1201   HCT 36.4 05/27/2014 2215   PLT 301 07/18/2021 1201   PLT 293 05/27/2014 2215   MCV 82.1 07/18/2021 1201   MCV 81 05/27/2014 2215   NEUTROABS 2.5 07/18/2021 1201   NEUTROABS 5.4 05/27/2014 2215   LYMPHSABS 2.2 07/18/2021 1201   LYMPHSABS 3.0 05/27/2014 2215   MONOABS 0.4 07/18/2021 1201   MONOABS 0.6 05/27/2014 2215   EOSABS 0.1 07/18/2021 1201   EOSABS 0.0 05/27/2014 2215   BASOSABS 0.0 07/18/2021 1201   BASOSABS 0.0 05/27/2014 2215   Comprehensive Metabolic Panel:    Component Value Date/Time   NA 138 07/18/2021 1201   NA 143 07/08/2016  1259   NA 139 05/27/2014 2215   K 3.7 07/18/2021 1201   K 3.5 05/27/2014 2215   CL 105 07/18/2021 1201   CL 105 05/27/2014 2215   CO2 25 07/18/2021 1201   CO2 26 05/27/2014 2215   BUN 11 07/18/2021 1201   BUN 10 07/08/2016 1259   BUN 14 05/27/2014 2215   CREATININE 0.78 07/18/2021 1201   CREATININE 1.09 (H) 05/27/2014 2215   GLUCOSE 112 (H) 07/18/2021 1201   GLUCOSE 104 (H) 05/27/2014 2215   CALCIUM 8.9 07/18/2021 1201   CALCIUM 9.0 05/27/2014 2215   AST 19 07/18/2021 1201   AST < 5 (L) 08/05/2013 0832   ALT 13 07/18/2021 1201   ALT 24 08/05/2013 0832   ALKPHOS 87 07/18/2021 1201   ALKPHOS 97 08/05/2013 0832   BILITOT 0.4 07/18/2021 1201   BILITOT 0.4 07/08/2016 1259   BILITOT 0.4 08/05/2013 0832   PROT 7.7 07/18/2021 1201   PROT 7.0 07/08/2016 1259   PROT 6.8 08/05/2013 0832   ALBUMIN 3.8 07/18/2021 1201   ALBUMIN 4.0 07/08/2016 1259   ALBUMIN 3.4 08/05/2013 0832    RADIOGRAPHIC STUDIES: MR Brain W Wo Contrast  Result Date: 08/30/2021 CLINICAL DATA:  60-year-old female with head CT 04/01/2013. Cervical spine MRI 11/09/2012. Breast cancer diagnosed last month. Staging. EXAM: MRI HEAD WITHOUT AND WITH CONTRAST TECHNIQUE: Multiplanar, multiecho pulse sequences of the brain and surrounding structures were obtained without and with intravenous contrast. CONTRAST:  10mL GADAVIST GADOBUTROL 1 MMOL/ML IV SOLN COMPARISON:  None Available. FINDINGS: Brain: Normal cerebral volume. No midline shift, mass effect, or evidence of intracranial mass lesion. No abnormal enhancement identified. No dural thickening identified. Partially empty sella. No restricted diffusion to suggest acute infarction. No ventriculomegaly, extra-axial collection or acute intracranial hemorrhage. Cervicomedullary junction is within normal limits. Essentially normal for age gray and white matter signal throughout the brain; there is a solitary nonspecific, nonenhancing small left centrum semiovale white matter T2 and  FLAIR hyperintensity on series 15, image 35. No cortical encephalomalacia or chronic cerebral blood products identified. Vascular: Major intracranial vascular flow voids are preserved. The major dural venous sinuses are enhancing and appear to be patent. Skull and upper cervical spine: Visualized bone marrow signal is within normal limits. Negative visible cervical spine. Sinuses/Orbits: Negative; trace paranasal sinus mucosal thickening. Other: Mastoids are clear.   Visible internal auditory structures appear normal. Negative visible scalp and face. IMPRESSION: No metastatic disease or acute intracranial abnormality. Essentially normal for age MRI appearance of the brain. Electronically Signed   By: Genevie Ann M.D.   On: 08/30/2021 11:44   [place] AXILLARY NODE CORE BIOPSY LEFT  Addendum Date: 08/25/2021   ADDENDUM REPORT: 08/25/2021 14:30 ADDENDUM: PATHOLOGY revealed: Site A. BREAST, LEFT AT 5:00, RETROAREOLAR; ULTRASOUND-GUIDED CORE NEEDLE BIOPSY: - INVASIVE MAMMARY CARCINOMA, NO SPECIAL TYPE. Size of invasive carcinoma: 15 mm in this sample. Grade 3. Ductal carcinoma in situ: Present, high-grade with comedonecrosis and calcifications. Lymphovascular invasion: Not identified. Pathology results are CONCORDANT with imaging findings, per Dr. Zerita Boers. PATHOLOGY revealed: Site B. 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 Pathology results are CONCORDANT with imaging findings, per Dr. Zerita Boers. Pathology results and recommendations below were discussed with patient by telephone on 08/25/2021. Patient reported biopsy site within normal limits with slight tenderness at the site. Post biopsy care instructions were reviewed, questions were answered and my direct phone number was provided to patient. Patient was instructed to call [facility] if any concerns or questions arise related to the biopsy. RECOMMENDATIONS:  Surgical and oncological consultation. Request for surgical and oncological consultation relayed to Casper Harrison RN at [facility] by Electa Sniff RN on 08/25/2021. Pathology results reported by Electa Sniff RN on 08/25/2021. Electronically Signed   By: Valentino Saxon M.D.   On: 08/25/2021 14:30   Result Date: 08/25/2021 CLINICAL DATA:  Here for ultrasound-guided two-site biopsy of a mass in the left breast and a lymph node in the left axilla. EXAM: ULTRASOUND GUIDED LEFT BREAST CORE NEEDLE BIOPSY ULTRASOUND-GUIDED LEFT AXILLARY LYMPH NODE BIOPSY COMPARISON:  Previous exam(s). PROCEDURE: I met with the patient and we discussed the procedure of ultrasound-guided biopsy, including benefits and alternatives. We discussed the high likelihood of a successful procedure. We discussed the risks of the procedure, including infection, bleeding, tissue injury, clip migration, and inadequate sampling. Informed written consent was given. The usual time-out protocol was performed immediately prior to the procedure. Site 1- Lesion quadrant: Lower outer quadrant Using sterile technique and 1% Lidocaine as local anesthetic, under direct ultrasound visualization, a 12 gauge spring-loaded device was used to perform biopsy of an irregular hypoechoic mass in the retroareolar left breast at 5 o'clock using a lateral approach. At the conclusion of the procedure a ribbon shaped tissue marker clip was deployed into the biopsy cavity. Follow up 2 view mammogram was performed and dictated separately. Site 2- Lesion quadrant: Lower outer quadrant Using sterile technique and 1% Lidocaine as local anesthetic, under direct ultrasound visualization, a 12 gauge spring-loaded device was used to perform biopsy of an enlarged left axillary lymph node using a lateral approach. At the conclusion of the procedure an open coil shaped tissue marker clip was deployed into the biopsy cavity. Follow up 2 view mammogram was performed and  dictated separately. IMPRESSION: Ultrasound guided two-site biopsy of a mass in the left breast and a lymph node in the left axilla. No apparent complications. Electronically Signed: By: Zerita Boers M.D. On: 08/21/2021 10:58   [place] LT BREAST BX W LOC DEV 1ST LESION IMG BX SPEC US GUIDE  Addendum Date: 08/25/2021   ADDENDUM REPORT: 08/25/2021 14:30 ADDENDUM: PATHOLOGY revealed: Site A. BREAST, LEFT AT 5:00, RETROAREOLAR; ULTRASOUND-GUIDED CORE NEEDLE BIOPSY: - INVASIVE MAMMARY CARCINOMA, NO SPECIAL TYPE. Size of invasive carcinoma:  15 mm in this sample. Grade 3. Ductal carcinoma in situ: Present, high-grade with comedonecrosis and calcifications. Lymphovascular invasion: Not identified. Pathology results are CONCORDANT with imaging findings, per Dr. Tyler Litton. PATHOLOGY revealed: Site B. 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 Pathology results are CONCORDANT with imaging findings, per Dr. Tyler Litton. Pathology results and recommendations below were discussed with patient by telephone on 08/25/2021. Patient reported biopsy site within normal limits with slight tenderness at the site. Post biopsy care instructions were reviewed, questions were answered and my direct phone number was provided to patient. Patient was instructed to call [facility] if any concerns or questions arise related to the biopsy. RECOMMENDATIONS: Surgical and oncological consultation. Request for surgical and oncological consultation relayed to Ana Moore RN at  [facility] by Linda Nash RN on 08/25/2021. Pathology results reported by Linda Nash RN on 08/25/2021. Electronically Signed   By: Stephanie  Peacock M.D.   On: 08/25/2021 14:30   Result Date: 08/25/2021 CLINICAL DATA:  Here for ultrasound-guided two-site biopsy of a mass in the left breast and a lymph node in the left axilla. EXAM: ULTRASOUND GUIDED LEFT  BREAST CORE NEEDLE BIOPSY ULTRASOUND-GUIDED LEFT AXILLARY LYMPH NODE BIOPSY COMPARISON:  Previous exam(s). PROCEDURE: I met with the patient and we discussed the procedure of ultrasound-guided biopsy, including benefits and alternatives. We discussed the high likelihood of a successful procedure. We discussed the risks of the procedure, including infection, bleeding, tissue injury, clip migration, and inadequate sampling. Informed written consent was given. The usual time-out protocol was performed immediately prior to the procedure. Site 1- Lesion quadrant: Lower outer quadrant Using sterile technique and 1% Lidocaine as local anesthetic, under direct ultrasound visualization, a 12 gauge spring-loaded device was used to perform biopsy of an irregular hypoechoic mass in the retroareolar left breast at 5 o'clock using a lateral approach. At the conclusion of the procedure a ribbon shaped tissue marker clip was deployed into the biopsy cavity. Follow up 2 view mammogram was performed and dictated separately. Site 2- Lesion quadrant: Lower outer quadrant Using sterile technique and 1% Lidocaine as local anesthetic, under direct ultrasound visualization, a 12 gauge spring-loaded device was used to perform biopsy of an enlarged left axillary lymph node using a lateral approach. At the conclusion of the procedure an open coil shaped tissue marker clip was deployed into the biopsy cavity. Follow up 2 view mammogram was performed and dictated separately. IMPRESSION: Ultrasound guided two-site biopsy of a mass in the left breast and a lymph node in the left axilla. No apparent complications. Electronically Signed: By: Tyler  Litton M.D. On: 08/21/2021 10:58   MM CLIP PLACEMENT LEFT  Result Date: 08/21/2021 CLINICAL DATA:  Status post ultrasound-guided biopsy of a mass in the left breast and a lymph node in the left axilla. EXAM: 3D DIAGNOSTIC LEFT MAMMOGRAM POST ULTRASOUND BIOPSY COMPARISON:  Previous exam(s). FINDINGS:  3D Mammographic images were obtained following ultrasound guided biopsy of a mass in the left breast and a lymph node in the left axilla. The biopsy marking clips are in expected position at the sites of biopsy. IMPRESSION: Appropriate positioning of the ribbon shaped biopsy marking clip at the site of biopsy in the left breast as well as the open coil shaped biopsy marking clip at the site of biopsy in the left axilla. Final Assessment: Post Procedure Mammograms for Marker Placement Electronically Signed   By: Tyler  Litton   M.D.   On: 08/21/2021 12:57   PERFORMANCE STATUS (ECOG) : 1 - Symptomatic but completely ambulatory  Review of Systems Unless otherwise noted, a complete review of systems is negative.  Physical Exam General: NAD Cardiovascular: regular rate and rhythm Pulmonary: clear ant fields Abdomen: soft, nontender, + bowel sounds GU: no suprapubic tenderness Extremities: no edema, no joint deformities Skin: no rashes Neurological: Weakness but otherwise nonfocal  IMPRESSION: Patient is still pending completion of work-up with plan to start chemotherapy.  Symptomatically, she is doing reasonably well with exception of reported left breast pain, poorly controlled on Percocet 3-4 times daily prescribed by pain clinic.  Patient has requested if we could help manage her pain as she undergoes treatment for breast cancer.  She is currently under contract with [facility] in [place] with Dr. Nancy Fetter as attending.  We will send a message to Dr. Nancy Fetter to discuss patient's care.  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  We will involve social work.  We will also resend referrals to other supportive care services including nutrition, genetics, and OT.  I sent patient home with an ACP and MOST form to review.   PLAN: -Continue current scope of treatment -We will call pain clinic -Referrals for rehab screening, nutrition,  genetics, and social work -ACP/MOST form reviewed -Patient is pending PET scan 09/10/2021 -Follow-up telephone visit 3 to 4 weeks   Patient expressed understanding and was in agreement with this plan. She also understands that She can call clinic at any time with any questions, concerns, or complaints.   Thank you for allowing me to participate in the care of this very pleasant patient.   Time Total: 25 minutes  Visit consisted of counseling and education dealing with the complex and emotionally intense issues of symptom management in the setting of serious illness.Greater than 50%  of this time was spent counseling and coordinating care related to the above assessment and plan.  Signed by: Altha Harm, PhD, NP-C

## 2021-09-03 ENCOUNTER — Ambulatory Visit
Admission: RE | Admit: 2021-09-03 | Discharge: 2021-09-03 | Disposition: A | Discharge status: Home / Self Care | Payer: Medicare HMO | Source: Ambulatory Visit | Attending: Oncology | Admitting: Oncology

## 2021-09-03 DIAGNOSIS — R001 Bradycardia, unspecified: Secondary | ICD-10-CM | POA: Insufficient documentation

## 2021-09-03 DIAGNOSIS — Z0189 Encounter for other specified special examinations: Secondary | ICD-10-CM

## 2021-09-03 DIAGNOSIS — Z5181 Encounter for therapeutic drug level monitoring: Secondary | ICD-10-CM | POA: Insufficient documentation

## 2021-09-03 DIAGNOSIS — Z79899 Other long term (current) drug therapy: Secondary | ICD-10-CM | POA: Diagnosis not present

## 2021-09-03 DIAGNOSIS — E119 Type 2 diabetes mellitus without complications: Secondary | ICD-10-CM | POA: Diagnosis not present

## 2021-09-03 DIAGNOSIS — I517 Cardiomegaly: Secondary | ICD-10-CM | POA: Insufficient documentation

## 2021-09-03 LAB — ECHOCARDIOGRAM COMPLETE
AR max vel: 2.54 cm2
AV Area VTI: 2.42 cm2
AV Area mean vel: 2.12 cm2
AV Mean grad: 4 mmHg
AV Peak grad: 6.4 mmHg
Ao pk vel: 1.26 m/s
Area-P 1/2: 4.46 cm2
S' Lateral: 2.5 cm

## 2021-09-03 NOTE — Progress Notes (Signed)
*  PRELIMINARY RESULTS* Echocardiogram 2D Echocardiogram has been performed.  Sherrie Sport 09/03/2021, 10:40 AM

## 2021-09-08 ENCOUNTER — Inpatient Hospital Stay: Payer: Medicare HMO | Admitting: Licensed Clinical Social Worker

## 2021-09-08 ENCOUNTER — Other Ambulatory Visit: Payer: Medicare HMO

## 2021-09-08 DIAGNOSIS — Z171 Estrogen receptor negative status [ER-]: Secondary | ICD-10-CM

## 2021-09-08 NOTE — Progress Notes (Signed)
[place] CSW Progress Note  Clinical Education officer, museum contacted patient by phone to talk about financial concerns.  Patient stated she is unable to pay her rent and is requesting assistance.  Patient is pending disability approval. CSW told patient I would contact fund manager fir assistance and would request for her to call patient.  CSW will continue to find funding opportunities for the patient.    Adelene Amas, LCSW

## 2021-09-09 ENCOUNTER — Encounter: Payer: Self-pay | Admitting: Licensed Clinical Social Worker

## 2021-09-09 ENCOUNTER — Inpatient Hospital Stay: Payer: Medicare HMO

## 2021-09-09 NOTE — Progress Notes (Signed)
[place] CSW Progress Note  Clinical Social Worker  sent referral for food assistance  to [employer].    Adelene Amas, LCSW

## 2021-09-10 ENCOUNTER — Encounter
Admission: RE | Admit: 2021-09-10 | Discharge: 2021-09-10 | Disposition: A | Discharge status: Home / Self Care | Payer: Medicare HMO | Source: Ambulatory Visit | Attending: Oncology | Admitting: Oncology

## 2021-09-10 DIAGNOSIS — C50112 Malignant neoplasm of central portion of left female breast: Secondary | ICD-10-CM | POA: Diagnosis not present

## 2021-09-10 DIAGNOSIS — C773 Secondary and unspecified malignant neoplasm of axilla and upper limb lymph nodes: Secondary | ICD-10-CM | POA: Insufficient documentation

## 2021-09-10 DIAGNOSIS — N6325 Unspecified lump in the left breast, overlapping quadrants: Secondary | ICD-10-CM | POA: Insufficient documentation

## 2021-09-10 DIAGNOSIS — C50912 Malignant neoplasm of unspecified site of left female breast: Secondary | ICD-10-CM | POA: Diagnosis not present

## 2021-09-10 LAB — GLUCOSE, CAPILLARY: Glucose-Capillary: 101 mg/dL — ABNORMAL HIGH (ref 70–99)

## 2021-09-10 MED ORDER — FLUDEOXYGLUCOSE F - 18 (FDG) INJECTION
11.2000 | Freq: Once | INTRAVENOUS | Status: AC | PRN
  Administered 2021-09-10: 11.95 via INTRAVENOUS

## 2021-09-11 ENCOUNTER — Encounter: Payer: Self-pay | Admitting: Oncology

## 2021-09-11 ENCOUNTER — Inpatient Hospital Stay: Payer: Medicare HMO

## 2021-09-11 ENCOUNTER — Inpatient Hospital Stay (HOSPITAL_BASED_OUTPATIENT_CLINIC_OR_DEPARTMENT_OTHER): Payer: Medicare HMO | Admitting: Oncology

## 2021-09-11 VITALS — BP 120/63 | HR 73 | Temp 98.5°F | Ht 66.0 in | Wt 216.0 lb

## 2021-09-11 DIAGNOSIS — Z7189 Other specified counseling: Secondary | ICD-10-CM | POA: Diagnosis not present

## 2021-09-11 DIAGNOSIS — C50812 Malignant neoplasm of overlapping sites of left female breast: Secondary | ICD-10-CM | POA: Diagnosis not present

## 2021-09-11 DIAGNOSIS — Z5112 Encounter for antineoplastic immunotherapy: Secondary | ICD-10-CM | POA: Diagnosis not present

## 2021-09-11 DIAGNOSIS — Z171 Estrogen receptor negative status [ER-]: Secondary | ICD-10-CM

## 2021-09-11 DIAGNOSIS — Z5189 Encounter for other specified aftercare: Secondary | ICD-10-CM | POA: Diagnosis not present

## 2021-09-11 DIAGNOSIS — R110 Nausea: Secondary | ICD-10-CM | POA: Diagnosis not present

## 2021-09-11 DIAGNOSIS — I808 Phlebitis and thrombophlebitis of other sites: Secondary | ICD-10-CM | POA: Diagnosis not present

## 2021-09-11 DIAGNOSIS — Z5111 Encounter for antineoplastic chemotherapy: Secondary | ICD-10-CM

## 2021-09-11 LAB — COMPREHENSIVE METABOLIC PANEL
ALT: 21 U/L (ref 0–44)
AST: 24 U/L (ref 15–41)
Albumin: 3.6 g/dL (ref 3.5–5.0)
Alkaline Phosphatase: 91 U/L (ref 38–126)
Anion gap: 6 (ref 5–15)
BUN: 13 mg/dL (ref 6–20)
CO2: 25 mmol/L (ref 22–32)
Calcium: 9.1 mg/dL (ref 8.9–10.3)
Chloride: 109 mmol/L (ref 98–111)
Creatinine, Ser: 0.8 mg/dL (ref 0.44–1.00)
GFR, Estimated: >60 mL/min (ref >60)
Glucose, Bld: 106 mg/dL — ABNORMAL HIGH (ref 70–99)
Potassium: 3.9 mmol/L (ref 3.5–5.1)
Sodium: 140 mmol/L (ref 135–145)
Total Bilirubin: 0.2 mg/dL — ABNORMAL LOW (ref 0.3–1.2)
Total Protein: 6.8 g/dL (ref 6.5–8.1)

## 2021-09-11 LAB — CBC WITH DIFFERENTIAL/PLATELET
Abs Immature Granulocytes: 0.01 10*3/uL (ref 0.00–0.07)
Basophils Absolute: 0 10*3/uL (ref 0.0–0.1)
Basophils Relative: 1 %
Eosinophils Absolute: 0.1 10*3/uL (ref 0.0–0.5)
Eosinophils Relative: 2 %
HCT: 35.5 % — ABNORMAL LOW (ref 36.0–46.0)
Hemoglobin: 11.2 g/dL — ABNORMAL LOW (ref 12.0–15.0)
Immature Granulocytes: 0 %
Lymphocytes Relative: 38 %
Lymphs Abs: 2.4 10*3/uL (ref 0.7–4.0)
MCH: 26.2 pg (ref 26.0–34.0)
MCHC: 31.5 g/dL (ref 30.0–36.0)
MCV: 82.9 fL (ref 80.0–100.0)
Monocytes Absolute: 0.5 10*3/uL (ref 0.1–1.0)
Monocytes Relative: 8 %
Neutro Abs: 3.1 10*3/uL (ref 1.7–7.7)
Neutrophils Relative %: 51 %
Platelets: 276 10*3/uL (ref 150–400)
RBC: 4.28 MIL/uL (ref 3.87–5.11)
RDW: 14.6 % (ref 11.5–15.5)
WBC: 6.1 10*3/uL (ref 4.0–10.5)
nRBC: 0 % (ref 0.0–0.2)

## 2021-09-11 MED ORDER — LIDOCAINE-PRILOCAINE 2.5-2.5 % EX CREA: 1.0000 | TOPICAL_CREAM | CUTANEOUS | 12 refills | Status: DC | PRN

## 2021-09-11 MED ORDER — LORATADINE 10 MG PO TABS
10.0000 mg | ORAL_TABLET | Freq: Every day | ORAL | 2 refills | Status: DC
Start: 2021-09-11 — End: 2021-11-26

## 2021-09-11 MED FILL — Dexamethasone Sodium Phosphate Inj 100 MG/10ML: INTRAMUSCULAR | Qty: 1 | Status: AC

## 2021-09-11 MED FILL — Fosaprepitant Dimeglumine For IV Infusion 150 MG (Base Eq): INTRAVENOUS | Qty: 5 | Status: AC

## 2021-09-11 NOTE — Progress Notes (Signed)
ON PATHWAY REGIMEN - Breast  No Change  Continue With Treatment as Ordered.  Original Decision Date/Time: 08/28/2021 23:14     Cycle 1: A cycle is 21 days:     Pertuzumab      Trastuzumab-xxxx      Docetaxel      Carboplatin    Cycles 2 through 6: A cycle is every 21 days:     Pertuzumab      Trastuzumab-xxxx      Docetaxel      Carboplatin   **Always confirm dose/schedule in your pharmacy ordering system**  Patient Characteristics: Preoperative or Nonsurgical Candidate (Clinical Staging), Neoadjuvant Therapy followed by Surgery, Invasive Disease, Chemotherapy, HER2 Positive, ER Negative/Unknown Therapeutic Status: Preoperative or Nonsurgical Candidate (Clinical Staging) AJCC M Category: cM0 AJCC Grade: G3 Breast Surgical Plan: Neoadjuvant Therapy followed by Surgery ER Status: Negative (-) AJCC 8 Stage Grouping: IIIA HER2 Status: Positive (+) AJCC T Category: cT3 AJCC N Category: cN1 PR Status: Negative (-) Intent of Therapy: Curative Intent, Discussed with Patient

## 2021-09-12 ENCOUNTER — Encounter: Payer: Self-pay | Admitting: *Deleted

## 2021-09-12 ENCOUNTER — Encounter: Payer: Self-pay | Admitting: Licensed Clinical Social Worker

## 2021-09-12 ENCOUNTER — Encounter: Payer: Self-pay | Admitting: Oncology

## 2021-09-12 ENCOUNTER — Inpatient Hospital Stay: Payer: Medicare HMO

## 2021-09-12 VITALS — BP 121/66 | HR 81 | Temp 96.3°F | Resp 20

## 2021-09-12 DIAGNOSIS — R110 Nausea: Secondary | ICD-10-CM | POA: Diagnosis not present

## 2021-09-12 DIAGNOSIS — Z5112 Encounter for antineoplastic immunotherapy: Secondary | ICD-10-CM | POA: Diagnosis not present

## 2021-09-12 DIAGNOSIS — C50812 Malignant neoplasm of overlapping sites of left female breast: Secondary | ICD-10-CM | POA: Diagnosis not present

## 2021-09-12 DIAGNOSIS — Z5111 Encounter for antineoplastic chemotherapy: Secondary | ICD-10-CM | POA: Insufficient documentation

## 2021-09-12 DIAGNOSIS — Z5189 Encounter for other specified aftercare: Secondary | ICD-10-CM | POA: Diagnosis not present

## 2021-09-12 DIAGNOSIS — I808 Phlebitis and thrombophlebitis of other sites: Secondary | ICD-10-CM | POA: Diagnosis not present

## 2021-09-12 DIAGNOSIS — Z171 Estrogen receptor negative status [ER-]: Secondary | ICD-10-CM | POA: Diagnosis not present

## 2021-09-12 MED ORDER — PALONOSETRON HCL INJECTION 0.25 MG/5ML
0.2500 mg | Freq: Once | INTRAVENOUS | Status: AC
  Administered 2021-09-12: 0.25 mg via INTRAVENOUS

## 2021-09-12 MED ORDER — SODIUM CHLORIDE 0.9 % IV SOLN
840.0000 mg | Freq: Once | INTRAVENOUS | Status: AC
  Administered 2021-09-12: 840 mg via INTRAVENOUS
  Filled 2021-09-12: qty 28

## 2021-09-12 MED ORDER — ACETAMINOPHEN 325 MG PO TABS
650.0000 mg | ORAL_TABLET | Freq: Once | ORAL | Status: AC
  Administered 2021-09-12: 650 mg via ORAL

## 2021-09-12 MED ORDER — TRASTUZUMAB-ANNS CHEMO 150 MG IV SOLR
8.0000 mg/kg | Freq: Once | INTRAVENOUS | Status: AC
  Administered 2021-09-12: 777 mg via INTRAVENOUS
  Filled 2021-09-12: qty 37

## 2021-09-12 MED ORDER — DIPHENHYDRAMINE HCL 25 MG PO CAPS
50.0000 mg | ORAL_CAPSULE | Freq: Once | ORAL | Status: AC
  Administered 2021-09-12: 50 mg via ORAL

## 2021-09-12 MED ORDER — SODIUM CHLORIDE 0.9 % IV SOLN
852.6000 mg | Freq: Once | INTRAVENOUS | Status: AC
  Administered 2021-09-12: 850 mg via INTRAVENOUS
  Filled 2021-09-12: qty 85

## 2021-09-12 MED ORDER — SODIUM CHLORIDE 0.9 % IV SOLN
75.0000 mg/m2 | Freq: Once | INTRAVENOUS | Status: AC
  Administered 2021-09-12: 160 mg via INTRAVENOUS
  Filled 2021-09-12: qty 16

## 2021-09-12 MED ORDER — TRASTUZUMAB-DKST CHEMO 150 MG IV SOLR: 8.0000 mg/kg | Freq: Once | INTRAVENOUS | Status: DC

## 2021-09-12 MED ORDER — SODIUM CHLORIDE 0.9 % IV SOLN
150.0000 mg | Freq: Once | INTRAVENOUS | Status: AC
  Administered 2021-09-12: 150 mg via INTRAVENOUS
  Filled 2021-09-12: qty 5

## 2021-09-12 MED ORDER — SODIUM CHLORIDE 0.9 % IV SOLN
10.0000 mg | Freq: Once | INTRAVENOUS | Status: AC
  Administered 2021-09-12: 10 mg via INTRAVENOUS
  Filled 2021-09-12: qty 1

## 2021-09-12 MED ORDER — MONTELUKAST SODIUM 10 MG PO TABS
10.0000 mg | ORAL_TABLET | Freq: Once | ORAL | Status: AC
  Administered 2021-09-12: 10 mg via ORAL

## 2021-09-12 MED ORDER — SODIUM CHLORIDE 0.9 % IV SOLN
Freq: Once | INTRAVENOUS | Status: AC
  Filled 2021-09-12: qty 250

## 2021-09-12 NOTE — Patient Instructions (Signed)
[facility] AT [place]  Discharge Instructions: Thank you for choosing [facility] to provide your oncology and hematology care.  If you have a lab appointment with the [facility], please go directly to the [facility] and check in at the registration area.  Wear comfortable clothing and clothing appropriate for easy access to any Portacath or PICC line.   We strive to give you quality time with your provider. You may need to reschedule your appointment if you arrive late (15 or more minutes).  Arriving late affects you and other patients whose appointments are after yours.  Also, if you miss three or more appointments without notifying the office, you may be dismissed from the clinic at the provider's discretion.      For prescription refill requests, have your pharmacy contact our office and allow 72 hours for refills to be completed.    Today you received the following chemotherapy and/or immunotherapy agents: Herceptin, Perjeta, Taxotere, Carboplatin      To help prevent nausea and vomiting after your treatment, we encourage you to take your nausea medication as directed.  BELOW ARE SYMPTOMS THAT SHOULD BE REPORTED IMMEDIATELY: *FEVER GREATER THAN 100.4 F (38 C) OR HIGHER *CHILLS OR SWEATING *NAUSEA AND VOMITING THAT IS NOT CONTROLLED WITH YOUR NAUSEA MEDICATION *UNUSUAL SHORTNESS OF BREATH *UNUSUAL BRUISING OR BLEEDING *URINARY PROBLEMS (pain or burning when urinating, or frequent urination) *BOWEL PROBLEMS (unusual diarrhea, constipation, pain near the anus) TENDERNESS IN MOUTH AND THROAT WITH OR WITHOUT PRESENCE OF ULCERS (sore throat, sores in mouth, or a toothache) UNUSUAL RASH, SWELLING OR PAIN  UNUSUAL VAGINAL DISCHARGE OR ITCHING   Items with * indicate a potential emergency and should be followed up as soon as possible or go to the Emergency Department if any problems should occur.  Please show the CHEMOTHERAPY ALERT CARD or  IMMUNOTHERAPY ALERT CARD at check-in to the Emergency Department and triage nurse.  Should you have questions after your visit or need to cancel or reschedule your appointment, please contact [facility] [place] AT [place]  [phone] and follow the prompts.  Office hours are 8:00 a.m. to 4:30 p.m. Monday - Friday. Please note that voicemails left after 4:00 p.m. may not be returned until the following business day.  We are closed weekends and major holidays. You have access to a nurse at all times for urgent questions. Please call the main number to the clinic [phone] and follow the prompts.  For any non-urgent questions, you may also contact your provider using MyChart. We now offer e-Visits for anyone 18 and older to request care online for non-urgent symptoms. For details visit [email or web address].   Also download the MyChart app! Go to the app store, search "MyChart", open the app, select [facility], and log in with your MyChart username and password.  Masks are optional in the cancer centers. If you would like for your care team to wear a mask while they are taking care of you, please let them know. For doctor visits, patients may have with them one support person who is at least 59 years old. At this time, visitors are not allowed in the infusion area.

## 2021-09-12 NOTE — Assessment & Plan Note (Signed)
Discussed with patient

## 2021-09-12 NOTE — Assessment & Plan Note (Signed)
Treatment plan as listed above. 

## 2021-09-12 NOTE — Progress Notes (Signed)
Hematology/Oncology Progress note [phone] [phone]         Patient Care Team: [place] as PCP - General Daiva Huge, RN as Oncology Nurse Navigator  REFERRING PROVIDER: Duffy Bruce, MD   ASSESSMENT & PLAN:   Cancer Staging  Breast cancer [facility] Staging form: Breast, AJCC 8th Edition - Clinical stage from 08/21/2021: Stage IIIB (cT4b, cN3c, cM0, G3, ER-, PR-, HER2+) - Signed by Earlie Server, MD on 09/12/2021  Breast cancer ([place]) Left breast cancer, cT4b N3 Mx, ER/PR-, HER2 + Imaging findings and pathology reports were reviewed and discussed with patient Rationale and potential side effects of TCHP were reviewed and discussed with patient.  She agrees with the plan.  Labs are reviewed and discussed with patient. Proceed with TCHP, D4 Udenyca.  Recommend Claritin 31m daily  Encounter for antineoplastic chemotherapy Treatment plan as listed above  Goals of care, counseling/discussion Discussed with patient   Patient is interested in to have Mediport placed.  Refer to surgery to reestablish care. Orders Placed This Encounter  Procedures   CBC with Differential    Standing Status:   Future    Standing Expiration Date:   10/09/2022   Comprehensive metabolic panel    Standing Status:   Future    Standing Expiration Date:   10/09/2022   CBC with Differential    Standing Status:   Future    Standing Expiration Date:   10/30/2022   Comprehensive metabolic panel    Standing Status:   Future    Standing Expiration Date:   10/30/2022   CBC with Differential    Standing Status:   Future    Standing Expiration Date:   11/20/2022   Comprehensive metabolic panel    Standing Status:   Future    Standing Expiration Date:   11/20/2022   CBC with Differential    Standing Status:   Future    Standing Expiration Date:   12/11/2022   Comprehensive metabolic panel    Standing Status:   Future    Standing Expiration Date:   12/11/2022   CBC with  Differential    Standing Status:   Future    Standing Expiration Date:   01/01/2023   Comprehensive metabolic panel    Standing Status:   Future    Standing Expiration Date:   01/01/2023    All questions were answered. The patient knows to call the clinic with any problems questions or concerns.  Return of visit:  Treatment tmr,  Lab NP + IVF 1 week flex.  3 weeks. Lab MD TCHP w D3 uSula Rumple MD, PhD [facility] 09/11/2021     CHIEF COMPLAINTS/REASON FOR VISIT:  Follow-up for breast cancer  HISTORY OF PRESENTING ILLNESS:  SSHYIA FILLINGIMis a  61y.o.  female with PMH listed below who was referred to me for evaluation of  left breast mass SUMMARY OF ONCOLOGIC HISTORY: Oncology History  Breast cancer ([facility]essa Fisher  07/31/2021 Imaging   Bilateral diagnostic mammogram and [place]owed 5 centimeter LEFT breast mass associated with pleomorphic calcifications is suspicious for invasive ductal carcinoma.At least 4 LEFT axillary lymph nodes with abnormal morphology   08/21/2021 Cancer Staging   Staging form: Breast, AJCC 8th Edition - Clinical: Stage IIIB (cT4b, cN3c, cM0, G3, ER-, PR-, HER2+) - Signed by YEarlie Server MD on 08/28/2021 Histologic grading system: 3 grade system  -08/21/21 left breast ultrasound-guided biopsy showed invasive mammary carcinoma, grade 3, ER/PR negative, HER2  positive.  Left axillary lymph node biopsy positive for macro metastatic mammary carcinoma, 8 mm in greatest extent.   08/30/2021 Imaging   MRI brain w wo contrast -No metastatic disease or acute intracranial abnormality. Essentially normal for age MRI appearance of the brain.    09/03/2021 Echocardiogram   1. Left ventricular ejection fraction, by estimation, is 55 to 60%. Left ventricular ejection fraction by 3D volume is 58 %. The left ventricle has normal function. The left ventricle has no regional wall motion abnormalities. There is mild left  ventricular hypertrophy. Left  ventricular diastolic parameters were normal.  2. Right ventricular systolic function is normal. The right ventricular size is normal.  3. The mitral valve is normal in structure. No evidence of mitral valve regurgitation.  4. The aortic valve was not well visualized. Aortic valve regurgitation is not visualized   09/10/2021 Imaging   PET scan showed Large hypermetabolic left breast mass and diffuse skin thickening,consistent with primary breast carcinoma. Hypermetabolic lymphadenopathy in the left axilla, left subpectoral region, and left supraclavicular region, consistent with metastatic disease. No evidence of metastatic disease within the abdomen or pelvis   09/12/2021 -  Chemotherapy   Patient is on Treatment Plan : BREAST  Docetaxel + Carboplatin + Trastuzumab + Pertuzumab  (TCHP) q21d       Patient present to review pathology results and management plan. She continues to have sharp remittent left breast pain.  Patient follows up with pain clinic for chronic pain syndrome. She has oxycodone acetaminophen 10/325 mg tablets, reports not helping her pain.  She cannot tolerate tramadol, NSAIDs gabapentin. Intermittent headache, no nausea vomiting. Memory loss/confusion intermittently. Patient plans to work 2 hours daily during chemotherapy treatments.   MEDICAL HISTORY:  Past Medical History:  Diagnosis Date   Anemia    Anxiety    Arthritis    Asthma    WELL CONTROLLED   Bile acid malabsorption syndrome    Bradycardia    HAD AN ISSUE WITH THIS IN 2016-NO PROBLEMS SINCE   Depression    Diabetes mellitus without complication (HCC)    Diverticulitis    Gastric reflux    GERD (gastroesophageal reflux disease)    Hand pain 05/12/2016   Headache    H/O MIGRAINES   History of kidney stones    H/O   Neck pain, chronic    Panic attacks     SURGICAL HISTORY: Past Surgical History:  Procedure Laterality Date   BREAST BIOPSY Left 08/21/2021   Axilla Bx, Hydromarker, path pending    BREAST BIOPSY Left 08/21/2021   [place] Bx, Ribbon Clip, Path Pending   CARPAL TUNNEL RELEASE Right 12/06/2017   Procedure: CARPAL TUNNEL RELEASE;  Surgeon: Earnestine Leys, MD;  Location: [facility] ORS;  Service: Orthopedics;  Laterality: Right;   COLONOSCOPY WITH PROPOFOL N/A 06/11/2021   Procedure: COLONOSCOPY WITH PROPOFOL;  Surgeon: Lin Landsman, MD;  Location: [facility] ENDOSCOPY;  Service: Gastroenterology;  Laterality: N/A;   DORSAL COMPARTMENT RELEASE Right 12/06/2017   Procedure: RELEASE DORSAL COMPARTMENT (DEQUERVAIN);  Surgeon: Earnestine Leys, MD;  Location: [facility] ORS;  Service: Orthopedics;  Laterality: Right;   ESOPHAGOGASTRODUODENOSCOPY (EGD) WITH PROPOFOL N/A 06/11/2021   Procedure: ESOPHAGOGASTRODUODENOSCOPY (EGD) WITH PROPOFOL;  Surgeon: Lin Landsman, MD;  Location[facility]COPY;  Service: Gastroenterology;  Laterality: N/A;   PARTIAL HYSTERECTOMY     TUBAL LIGATION      SOCIAL HISTORY: Social History   Socioeconomic History   Marital status: Single    Spouse name: Not on  file   Number of children: Not on file   Years of education: Not on file   Highest education level: Not on file  Occupational History   Not on file  Tobacco Use   Smoking status: Never   Smokeless tobacco: Never  Vaping Use   Vaping Use: Never used  Substance and Sexual Activity   Alcohol use: No   Drug use: No   Sexual activity: Not on file  Other Topics Concern   Not on file  Social History Narrative   Not on file   Social Determinants of Health   Financial Resource Strain: High Risk (08/15/2021)   Overall Financial Resource Strain (CARDIA)    Difficulty of Paying Living Expenses: Very hard  Food Insecurity: Food Insecurity Present (09/09/2021)   Hunger Vital Sign    Worried About Running Out of Food in the Last Year: Often true    Ran Out of Food in the Last Year: Often true  Transportation Needs: No Transportation Needs (08/15/2021)   PRAPARE - Radiographer, therapeutic (Medical): No    Lack of Transportation (Non-Medical): No  Physical Activity: Inactive (08/15/2021)   Exercise Vital Sign    Days of Exercise per Week: 0 days    Minutes of Exercise per Session: 0 min  Stress: Stress Concern Present (08/15/2021)   [place]    Feeling of Stress : Rather much  Social Connections: Socially Isolated (08/15/2021)   Social Connection and Isolation Panel [NHANES]    Frequency of Communication with Friends and Family: Once a week    Frequency of Social Gatherings with Friends and Family: Once a week    Attends Religious Services: Never    Marine scientist or Organizations: No    Attends Music therapist: Not on file    Marital Status: Never married  Intimate Partner Violence: Not At Risk (08/15/2021)   Humiliation, Afraid, Rape, and Kick questionnaire    Fear of Current or Ex-Partner: No    Emotionally Abused: No    Physically Abused: No    Sexually Abused: No    FAMILY HISTORY: Family History  Problem Relation Age of Onset   Diabetes Mother    Hypertension Mother    Heart failure Mother    Cancer Mother    Diabetes Father    Hypertension Father    Breast cancer Maternal Aunt     ALLERGIES:  is allergic to bc powder [aspirin-salicylamide-caffeine], gabapentin, hydrocodone-acetaminophen, latex, penicillin g, penicillins, and shrimp [shellfish allergy].  MEDICATIONS:  Current Outpatient Medications  Medication Sig Dispense Refill   albuterol (PROVENTIL) (2.5 MG/3ML) 0.083% nebulizer solution Take 3 mLs (2.5 mg total) by nebulization every 4 (four) hours as needed for wheezing or shortness of breath. 75 mL 2   albuterol (VENTOLIN HFA) 108 (90 Base) MCG/ACT inhaler Inhale 2 puffs into the lungs every 6 (six) hours as needed for wheezing or shortness of breath. 18 g 0   aspirin 81 MG chewable tablet Chew 81 mg by mouth daily.     FLOVENT DISKUS 250  MCG/ACT AEPB Inhale into the lungs.     fluticasone (FLONASE) 50 MCG/ACT nasal spray Place 2 sprays into both nostrils daily. 15.8 mL 0   lidocaine-prilocaine (EMLA) cream Apply 1 Application topically as needed. 30 g 12   lisinopril-hydrochlorothiazide (ZESTORETIC) 10-12.5 MG tablet Take 1 tablet by mouth daily.     loratadine (CLARITIN) 10 MG tablet Take  1 tablet (10 mg total) by mouth daily. 30 tablet 2   montelukast (SINGULAIR) 10 MG tablet Take by mouth.     oxyCODONE-acetaminophen (PERCOCET) 10-325 MG tablet Take 1 tablet by mouth 3 (three) times daily as needed.     pantoprazole (PROTONIX) 40 MG tablet Take 40 mg by mouth daily.     TRULICITY 3 ZD/6.6YQ SOPN SMARTSIG:3 Milligram(s) SUB-Q Once a Week     dexamethasone (DECADRON) 4 MG tablet Take 2 tablets (8 mg total) by mouth daily. Start the day before Taxotere. Then take daily x 2 days after chemotherapy. (Patient not taking: Reported on 09/11/2021) 30 tablet 1   naloxone (NARCAN) nasal spray 4 mg/0.1 mL SMARTSIG:Both Nares (Patient not taking: Reported on 09/02/2021)     ondansetron (ZOFRAN) 8 MG tablet Take 1 tablet (8 mg total) by mouth 2 (two) times daily as needed (Nausea or vomiting). Start on the third day after chemotherapy. (Patient not taking: Reported on 09/02/2021) 30 tablet 1   prochlorperazine (COMPAZINE) 10 MG tablet Take 1 tablet (10 mg total) by mouth every 6 (six) hours as needed (Nausea or vomiting). (Patient not taking: Reported on 09/02/2021) 30 tablet 1   No current facility-administered medications for this visit.    Review of Systems  Constitutional:  Negative for appetite change, chills, fatigue and fever.  HENT:   Negative for hearing loss and voice change.   Eyes:  Negative for eye problems.  Respiratory:  Negative for chest tightness and cough.   Cardiovascular:  Negative for chest pain.  Gastrointestinal:  Negative for abdominal distention, abdominal pain and blood in stool.  Endocrine: Negative for hot flashes.   Genitourinary:  Negative for difficulty urinating and frequency.   Musculoskeletal:  Positive for arthralgias and back pain.  Skin:  Negative for itching and rash.  Neurological:  Negative for extremity weakness.  Hematological:  Negative for adenopathy.  Psychiatric/Behavioral:  Negative for confusion.   Left breast pain  PHYSICAL EXAMINATION: ECOG PERFORMANCE STATUS: 1 - Symptomatic but completely ambulatory Vitals:   09/11/21 1335  BP: 120/63  Pulse: 73  Temp: 98.5 F (36.9 C)   Filed Weights   09/11/21 1335  Weight: 216 lb (98 kg)   Physical Exam Constitutional:      General: She is not in acute distress. HENT:     Head: Normocephalic and atraumatic.  Eyes:     General: No scleral icterus. Cardiovascular:     Rate and Rhythm: Normal rate and regular rhythm.     Heart sounds: Normal heart sounds.  Pulmonary:     Effort: Pulmonary effort is normal. No respiratory distress.     Breath sounds: No wheezing.  Abdominal:     General: Bowel sounds are normal. There is no distension.     Palpations: Abdomen is soft.  Musculoskeletal:        General: No deformity. Normal range of motion.     Cervical back: Normal range of motion and neck supple.  Skin:    General: Skin is warm and dry.     Findings: No erythema or rash.  Neurological:     Mental Status: She is alert and oriented to person, place, and time. Mental status is at baseline.     Cranial Nerves: No cranial nerve deficit.     Coordination: Coordination normal.  Psychiatric:        Mood and Affect: Mood normal.   Gross examination reviewed palpable fixed left breast firm mass, no nipple discharge,slightly retracted nipple  with focal skin edema [peau d'orange].   No appreciable mass in the right breast.  No appreciable lymphadenopathy in the axilla bilaterally. 09/12/21    LABORATORY DATA:  I have reviewed the data as listed Lab Results  Component Value Date   WBC 6.1 09/11/2021   HGB 11.2 (L) 09/11/2021    HCT 35.5 (L) 09/11/2021   MCV 82.9 09/11/2021   PLT 276 09/11/2021   Lab Results  Component Value Date   NA 140 09/11/2021   K 3.9 09/11/2021   CL 109 09/11/2021   CO2 25 09/11/2021    RADIOGRAPHIC STUDIES: I have personally reviewed the radiological images as listed and agreed with the findings in the report. NM PET Image Initial (PI) Skull Base To Thigh  Result Date: 09/10/2021 CLINICAL DATA:  Initial treatment strategy for left breast carcinoma. EXAM: NUCLEAR MEDICINE PET SKULL BASE TO THIGH TECHNIQUE: 12.0 mCi F-18 FDG was injected intravenously. Full-ring PET imaging was performed from the skull base to thigh after the radiotracer. CT data was obtained and used for attenuation correction and anatomic localization. Fasting blood glucose: 101 mg/dl COMPARISON:  AP CT on 04/17/2021 FINDINGS: Mediastinal blood-pool activity (background): SUV max = 2.0 Liver activity (reference): SUV max = N/A NECK:  No hypermetabolic lymph nodes or masses. Incidental CT findings:  None. CHEST: Large multilobular mass and diffuse skin thickening is seen in the left breast. This has SUV max 14.9 consistent primary breast carcinoma. Hypermetabolic lymphadenopathy is seen in the left axilla measuring up to 3.1 x 2.5 cm with SUV max of 12.2. A few sub-cm left subpectoral lymph nodes are also seen with SUV max of 2.8. A few sub-cm left supraclavicular lymph nodes are also seen which has a SUV max of 3.3. No hypermetabolic mediastinal or hilar lymph nodes identified. No suspicious pulmonary nodules or masses seen on CT images. No evidence of pulmonary infiltrate or pleural effusion. Incidental CT findings:  None. ABDOMEN/PELVIS: No abnormal hypermetabolic activity within the liver, pancreas, adrenal glands, or spleen. No hypermetabolic lymph nodes in the abdomen or pelvis. Incidental CT findings:  None. SKELETON: No focal hypermetabolic bone lesions to suggest skeletal metastasis. Incidental CT findings:  None.  IMPRESSION: Large hypermetabolic left breast mass and diffuse skin thickening, consistent with primary breast carcinoma. Hypermetabolic lymphadenopathy in the left axilla, left subpectoral region, and left supraclavicular region, consistent with metastatic disease. No evidence of metastatic disease within the abdomen or pelvis. Electronically Signed   By: Marlaine Hind M.D.   On: 09/10/2021 14:41   ECHOCARDIOGRAM COMPLETE  Result Date: 09/03/2021    ECHOCARDIOGRAM REPORT   Patient Name:   Vanessa Fisher Date of Exam: 09/03/2021 Medical Rec #:  [id number]            Height:       66.0 in Accession #:    [id number]           Weight:       216.0 lb Date of Birth:  1960/07/27            BSA:          2.066 m Patient Age:    20 years             BP:           132/69 mmHg Patient Gender: F                    HR:  57 bpm. Exam Location:  [facility] Procedure: 2D Echo, Cardiac Doppler, Color Doppler and Strain Analysis Indications:     Chemo Z09  History:         Patient has no prior history of Echocardiogram examinations.                  Risk Factors:Diabetes. Bradycardia.  Sonographer:     Sherrie Sport Referring Phys:  [id number] Pantera [place]rowd Diagnosing Phys: Kate Sable MD  Sonographer Comments: Suboptimal apical window. Global longitudinal strain was attempted. IMPRESSIONS  1. Left ventricular ejection fraction, by estimation, is 55 to 60%. Left ventricular ejection fraction by 3D volume is 58 %. The left ventricle has normal function. The left ventricle has no regional wall motion abnormalities. There is mild left ventricular hypertrophy. Left ventricular diastolic parameters were normal.  2. Right ventricular systolic function is normal. The right ventricular size is normal.  3. The mitral valve is normal in structure. No evidence of mitral valve regurgitation.  4. The aortic valve was not well visualized. Aortic valve regurgitation is not visualized. FINDINGS  Left Ventricle: Left ventricular ejection fraction,  by estimation, is 55 to 60%. Left ventricular ejection fraction by 3D volume is 58 %. The left ventricle has normal function. The left ventricle has no regional wall motion abnormalities. Global longitudinal strain performed but not reported based on interpreter judgement due to suboptimal tracking. The left ventricular internal cavity size was normal in size. There is mild left ventricular hypertrophy. Left ventricular diastolic parameters were  normal. Right Ventricle: The right ventricular size is normal. No increase in right ventricular wall thickness. Right ventricular systolic function is normal. Left Atrium: Left atrial size was normal in size. Right Atrium: Right atrial size was normal in size. Pericardium: There is no evidence of pericardial effusion. Mitral Valve: The mitral valve is normal in structure. No evidence of mitral valve regurgitation. Tricuspid Valve: The tricuspid valve is normal in structure. Tricuspid valve regurgitation is trivial. Aortic Valve: The aortic valve was not well visualized. Aortic valve regurgitation is not visualized. Aortic valve mean gradient measures 4.0 mmHg. Aortic valve peak gradient measures 6.4 mmHg. Aortic valve area, by VTI measures 2.42 cm. Pulmonic Valve: The pulmonic valve was not well visualized. Pulmonic valve regurgitation is not visualized. Aorta: The aortic root is normal in size and structure. Venous: The inferior vena cava was not well visualized. IAS/Shunts: No atrial level shunt detected by color flow Doppler.  LEFT VENTRICLE PLAX 2D LVIDd:         3.70 cm         Diastology LVIDs:         2.50 cm         LV e' medial:    7.94 cm/s LV PW:         1.20 cm         LV E/e' medial:  11.6 LV IVS:        1.10 cm         LV e' lateral:   10.20 cm/s LVOT diam:     2.00 cm         LV E/e' lateral: 9.1 LV SV:         70 LV SV Index:   34 LVOT Area:     3.14 cm        3D Volume EF  LV 3D EF:    Left                                              ventricul                                             ar                                             ejection                                             fraction                                             by 3D                                             volume is                                             58 %.                                 3D Volume EF:                                3D EF:        58 %                                LV EDV:       243 ml                                LV ESV:       103 ml                                LV SV:        140 ml RIGHT VENTRICLE RV Basal diam:  3.70 cm RV S prime:     11.90 cm/s TAPSE (M-mode): 2.2 cm LEFT ATRIUM             Index        RIGHT ATRIUM           Index LA diam:        3.10 cm 1.50 cm/m   RA Area:  12.80 cm LA Vol (A2C):   60.0 ml 29.04 ml/m  RA Volume:   31.30 ml  15.15 ml/m LA Vol (A4C):   31.6 ml 15.29 ml/m LA Biplane Vol: 44.5 ml 21.54 ml/m  AORTIC VALVE AV Area (Vmax):    2.54 cm AV Area (Vmean):   2.12 cm AV Area (VTI):     2.42 cm AV Vmax:           126.00 cm/s AV Vmean:          96.100 cm/s AV VTI:            0.288 m AV Peak Grad:      6.4 mmHg AV Mean Grad:      4.0 mmHg LVOT Vmax:         102.00 cm/s LVOT Vmean:        64.800 cm/s LVOT VTI:          0.222 m LVOT/AV VTI ratio: 0.77  AORTA Ao Root diam: 2.53 cm MITRAL VALVE               TRICUSPID VALVE MV Area (PHT): 4.46 cm    TR Peak grad:   16.3 mmHg MV Decel Time: 170 msec    TR Vmax:        202.00 cm/s MV E velocity: 92.50 cm/s MV A velocity: 60.00 cm/s  SHUNTS MV E/A ratio:  1.54        Systemic VTI:  0.22 m                            Systemic Diam: 2.00 cm Kate Sable MD Electronically signed by Kate Sable MD Signature Date/Time: 09/03/2021/3:18:34 PM    Final    MR Brain W Wo Contrast  Result Date: 08/30/2021 CLINICAL DATA:  61 year old female with head CT 04/01/2013. Cervical spine MRI 11/09/2012. Breast cancer diagnosed last month. Staging. EXAM: MRI  HEAD WITHOUT AND WITH CONTRAST TECHNIQUE: Multiplanar, multiecho pulse sequences of the brain and surrounding structures were obtained without and with intravenous contrast. CONTRAST:  75m GADAVIST GADOBUTROL 1 MMOL/ML IV SOLN COMPARISON:  None Available. FINDINGS: Brain: Normal cerebral volume. No midline shift, mass effect, or evidence of intracranial mass lesion. No abnormal enhancement identified. No dural thickening identified. Partially empty sella. No restricted diffusion to suggest acute infarction. No ventriculomegaly, extra-axial collection or acute intracranial hemorrhage. Cervicomedullary junction is within normal limits. Essentially normal for age gray and white matter signal throughout the brain; there is a solitary nonspecific, nonenhancing small left centrum semiovale white matter T2 and FLAIR hyperintensity on series 15, image 35. No cortical encephalomalacia or chronic cerebral blood products identified. Vascular: Major intracranial vascular flow voids are preserved. The major dural venous sinuses are enhancing and appear to be patent. Skull and upper cervical spine: Visualized bone marrow signal is within normal limits. Negative visible cervical spine. Sinuses/Orbits: Negative; trace paranasal sinus mucosal thickening. Other: Mastoids are clear. Visible internal auditory structures appear normal. Negative visible scalp and face. IMPRESSION: No metastatic disease or acute intracranial abnormality. Essentially normal for age MRI appearance of the brain. Electronically Signed   By: HGenevie AnnM.D.   On: 08/30/2021 11:44   [place] NO[place] CORE BIOPSY LEFT  Addendum Date: 08/25/2021   ADDENDUM REPORT: 08/25/2021 14:30 ADDENDUM: PATHOLOGY revealed: Site A. BREAST, LEFT AT 5:00, RETROAREOLAR; ULTRASOUND-GUIDED CORE NEEDLE BIOPSY: - INVASIVE MAMMARY CARCINOMA, NO SPECIAL TYPE. Size of invasive carcinoma: 15 mm in this sample.  Grade 3. Ductal carcinoma in situ: Present, high-grade with comedonecrosis  and calcifications. Lymphovascular invasion: Not identified. Pathology results are CONCORDANT with imaging findings, per Dr. Zerita Boers. PATHOLOGY revealed: Site B. 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 Pathology results are CONCORDANT with imaging findings, per Dr. Zerita Boers. Pathology results and recommendations below were discussed with patient by telephone on 08/25/2021. Patient reported biopsy site within normal limits with slight tenderness at the site. Post biopsy care instructions were reviewed, questions were answered and my direct phone number was provided to patient. Patient was instructed to call [facility] if any concerns or questions arise related to the biopsy. RECOMMENDATIONS: Surgical and oncological consultation. Request for surgical and oncological consultation relayed to Casper Harrison RN at [place] [facility] by Electa Sniff RN on 08/25/2021. Pathology results reported by Electa Sniff RN on 08/25/2021. Electronically Signed   By: Valentino Saxon M.D.   On: 08/25/2021 14:30   Result Date: 08/25/2021 CLINICAL DATA:  Here for ultrasound-guided two-site biopsy of a mass in the left breast and a lymph node in the left axilla. EXAM: ULTRASOUND GUIDED LEFT BREAST CORE NEEDLE BIOPSY ULTRASOUND-GUIDED LEFT AXILLARY LYMPH NODE BIOPSY COMPARISON:  Previous exam(s). PROCEDURE: I met with the patient and we discussed the procedure of ultrasound-guided biopsy, including benefits and alternatives. We discussed the high likelihood of a successful procedure. We discussed the risks of the procedure, including infection, bleeding, tissue injury, clip migration, and inadequate sampling. Informed written consent was given. The usual time-out protocol was performed immediately prior to the procedure. Site 1- Lesion quadrant: Lower outer quadrant Using sterile technique and 1% Lidocaine as local  anesthetic, under direct ultrasound visualization, a 12 gauge spring-loaded device was used to perform biopsy of an irregular hypoechoic mass in the retroareolar left breast at 5 o'clock using a lateral approach. At the conclusion of the procedure a ribbon shaped tissue marker clip was deployed into the biopsy cavity. Follow up 2 view mammogram was performed and dictated separately. Site 2- Lesion quadrant: Lower outer quadrant Using sterile technique and 1% Lidocaine as local anesthetic, under direct ultrasound visualization, a 12 gauge spring-loaded device was used to perform biopsy of an enlarged left axillary lymph node using a lateral approach. At the conclusion of the procedure an open coil shaped tissue marker clip was deployed into the biopsy cavity. Follow up 2 view mammogram was performed and dictated separately. IMPRESSION: Ultrasound guided two-site biopsy of a mass in the left breast and a lymph node in the left axilla. No apparent complications. Electronically Signed: By: Zerita Boers M.D. On: 08/21/2021 10:58   [place] LT BREAST BX W LOC DEV 1ST LESION IMG BX SPEC US GUIDE  Addendum Date: 08/25/2021   ADDENDUM REPORT: 08/25/2021 14:30 ADDENDUM: PATHOLOGY revealed: Site A. BREAST, LEFT AT 5:00, RETROAREOLAR; ULTRASOUND-GUIDED CORE NEEDLE BIOPSY: - INVASIVE MAMMARY CARCINOMA, NO SPECIAL TYPE. Size of invasive carcinoma: 15 mm in this sample. Grade 3. Ductal carcinoma in situ: Present, high-grade with comedonecrosis and calcifications. Lymphovascular invasion: Not identified. Pathology results are CONCORDANT with imaging findings, per Dr. Zerita Boers. PATHOLOGY revealed: Site B. 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 Pathology results are CONCORDANT with imaging findings, per Dr. Zerita Boers. Pathology results and recommendations below were discussed with patient by telephone on 08/25/2021. Patient  reported biopsy site within normal limits with slight tenderness  at the site. Post biopsy care instructions were reviewed, questions were answered and my direct phone number was provided to patient. Patient was instructed to call [facility] if any concerns or questions arise related to the biopsy. RECOMMENDATIONS: Surgical and oncological consultation. Request for surgical and oncological consultation relayed to Casper Harrison RN at [facility] by Electa Sniff RN on 08/25/2021. Pathology results reported by Electa Sniff RN on 08/25/2021. Electronically Signed   By: Valentino Saxon M.D.   On: 08/25/2021 14:30   Result Date: 08/25/2021 CLINICAL DATA:  Here for ultrasound-guided two-site biopsy of a mass in the left breast and a lymph node in the left axilla. EXAM: ULTRASOUND GUIDED LEFT BREAST CORE NEEDLE BIOPSY ULTRASOUND-GUIDED LEFT AXILLARY LYMPH NODE BIOPSY COMPARISON:  Previous exam(s). PROCEDURE: I met with the patient and we discussed the procedure of ultrasound-guided biopsy, including benefits and alternatives. We discussed the high likelihood of a successful procedure. We discussed the risks of the procedure, including infection, bleeding, tissue injury, clip migration, and inadequate sampling. Informed written consent was given. The usual time-out protocol was performed immediately prior to the procedure. Site 1- Lesion quadrant: Lower outer quadrant Using sterile technique and 1% Lidocaine as local anesthetic, under direct ultrasound visualization, a 12 gauge spring-loaded device was used to perform biopsy of an irregular hypoechoic mass in the retroareolar left breast at 5 o'clock using a lateral approach. At the conclusion of the procedure a ribbon shaped tissue marker clip was deployed into the biopsy cavity. Follow up 2 view mammogram was performed and dictated separately. Site 2- Lesion quadrant: Lower outer quadrant Using sterile technique and 1% Lidocaine as local  anesthetic, under direct ultrasound visualization, a 12 gauge spring-loaded device was used to perform biopsy of an enlarged left axillary lymph node using a lateral approach. At the conclusion of the procedure an open coil shaped tissue marker clip was deployed into the biopsy cavity. Follow up 2 view mammogram was performed and dictated separately. IMPRESSION: Ultrasound guided two-site biopsy of a mass in the left breast and a lymph node in the left axilla. No apparent complications. Electronically Signed: By: Zerita Boers M.D. On: 08/21/2021 10:58   MM CLIP PLACEMENT LEFT  Result Date: 08/21/2021 CLINICAL DATA:  Status post ultrasound-guided biopsy of a mass in the left breast and a lymph node in the left axilla. EXAM: 3D DIAGNOSTIC LEFT MAMMOGRAM POST ULTRASOUND BIOPSY COMPARISON:  Previous exam(s). FINDINGS: 3D Mammographic images were obtained following ultrasound guided biopsy of a mass in the left breast and a lymph node in the left axilla. The biopsy marking clips are in expected position at the sites of biopsy. IMPRESSION: Appropriate positioning of the ribbon shaped biopsy marking clip at the site of biopsy in the left breast as well as the open coil shaped biopsy marking clip at the site of biopsy in the left axilla. Final Assessment: Post Procedure Mammograms for Marker Placement Electronically Signed   By: Zerita Boers M.D.   On: 08/21/2021 12:57

## 2021-09-12 NOTE — Progress Notes (Signed)
[place] CSW Progress Note  Clinical Education officer, museum  contacted patient's landlord mary Speight with [employer] [phone]  to confirm rent modification for patient.    Adelene Amas, LCSW

## 2021-09-12 NOTE — Addendum Note (Signed)
Addended by: Earlie Server on: 09/12/2021 11:33 PM   Modules accepted: Orders

## 2021-09-12 NOTE — Assessment & Plan Note (Signed)
Left breast cancer, cT4b N3 Mx, ER/PR-, HER2 + Imaging findings and pathology reports were reviewed and discussed with patient Rationale and potential side effects of TCHP were reviewed and discussed with patient.  She agrees with the plan.  Labs are reviewed and discussed with patient. Proceed with TCHP, D4 Udenyca.  Recommend Claritin 43m daily

## 2021-09-13 DIAGNOSIS — Z9181 History of falling: Secondary | ICD-10-CM | POA: Diagnosis not present

## 2021-09-13 DIAGNOSIS — Z79899 Other long term (current) drug therapy: Secondary | ICD-10-CM | POA: Diagnosis not present

## 2021-09-13 DIAGNOSIS — Z6839 Body mass index (BMI) 39.0-39.9, adult: Secondary | ICD-10-CM | POA: Diagnosis not present

## 2021-09-13 DIAGNOSIS — Z6834 Body mass index (BMI) 34.0-34.9, adult: Secondary | ICD-10-CM | POA: Diagnosis not present

## 2021-09-13 DIAGNOSIS — Z6833 Body mass index (BMI) 33.0-33.9, adult: Secondary | ICD-10-CM | POA: Diagnosis not present

## 2021-09-13 DIAGNOSIS — M545 Low back pain, unspecified: Secondary | ICD-10-CM | POA: Diagnosis not present

## 2021-09-13 DIAGNOSIS — G8929 Other chronic pain: Secondary | ICD-10-CM | POA: Diagnosis not present

## 2021-09-15 ENCOUNTER — Encounter: Payer: Self-pay | Admitting: *Deleted

## 2021-09-15 ENCOUNTER — Inpatient Hospital Stay: Payer: Medicare HMO

## 2021-09-15 ENCOUNTER — Telehealth: Payer: Self-pay

## 2021-09-15 ENCOUNTER — Other Ambulatory Visit: Payer: Self-pay

## 2021-09-15 DIAGNOSIS — Z171 Estrogen receptor negative status [ER-]: Secondary | ICD-10-CM

## 2021-09-15 DIAGNOSIS — Z5112 Encounter for antineoplastic immunotherapy: Secondary | ICD-10-CM | POA: Diagnosis not present

## 2021-09-15 DIAGNOSIS — Z5111 Encounter for antineoplastic chemotherapy: Secondary | ICD-10-CM | POA: Diagnosis not present

## 2021-09-15 DIAGNOSIS — C50812 Malignant neoplasm of overlapping sites of left female breast: Secondary | ICD-10-CM

## 2021-09-15 DIAGNOSIS — Z5189 Encounter for other specified aftercare: Secondary | ICD-10-CM | POA: Diagnosis not present

## 2021-09-15 DIAGNOSIS — I808 Phlebitis and thrombophlebitis of other sites: Secondary | ICD-10-CM | POA: Diagnosis not present

## 2021-09-15 DIAGNOSIS — R110 Nausea: Secondary | ICD-10-CM | POA: Diagnosis not present

## 2021-09-15 MED ORDER — PEGFILGRASTIM-CBQV 6 MG/0.6ML ~~LOC~~ SOSY
6.0000 mg | PREFILLED_SYRINGE | Freq: Once | SUBCUTANEOUS | Status: AC
  Administered 2021-09-15: 6 mg via SUBCUTANEOUS
  Filled 2021-09-15: qty 0.6

## 2021-09-15 NOTE — Telephone Encounter (Signed)
Telephone call to patient for follow up after receiving first infusion.   Patient states infusion went great.  States eating good and drinking plenty of fluids.   Denies any nausea or vomiting.  Encouraged patient to call for any concerns or questions. 

## 2021-09-15 NOTE — Telephone Encounter (Signed)
-----   Message from Daiva Huge, RN sent at 09/15/2021  9:20 AM EDT ----- Referral has been faxed to Dr. Peyton Najjar.  Thanks,   Ana  ----- Message ----- From: Earlie Server, MD Sent: 09/12/2021  11:35 PM EDT To: Evelina Dun, RN; Daiva Huge, RN; #  I don't think she has established with surgery yet. Please refer patient to [place] for evaluation and medi port placement.  Correct me if she has already has established care with surgeon. Thanks.  zy

## 2021-09-15 NOTE — Progress Notes (Signed)
Referral faxed to Dr. Peyton Najjar for port placement/breast cancer eval.   Spoke with patient and let her know they would be calling her with the appointment.   She expressed interest in the breast cancer support group, her name and phone number were emailed to [employer] to set her up with the support group.

## 2021-09-16 ENCOUNTER — Encounter: Payer: Self-pay | Admitting: Licensed Clinical Social Worker

## 2021-09-16 ENCOUNTER — Inpatient Hospital Stay: Payer: Medicare HMO

## 2021-09-16 ENCOUNTER — Encounter: Payer: Self-pay | Admitting: Hospice and Palliative Medicine

## 2021-09-16 ENCOUNTER — Inpatient Hospital Stay (HOSPITAL_BASED_OUTPATIENT_CLINIC_OR_DEPARTMENT_OTHER): Payer: Medicare HMO | Admitting: Hospice and Palliative Medicine

## 2021-09-16 ENCOUNTER — Other Ambulatory Visit: Payer: Self-pay

## 2021-09-16 VITALS — BP 117/70 | HR 69 | Temp 97.9°F | Resp 18

## 2021-09-16 VITALS — BP 131/68 | HR 70 | Resp 16

## 2021-09-16 DIAGNOSIS — E860 Dehydration: Secondary | ICD-10-CM

## 2021-09-16 DIAGNOSIS — Z5189 Encounter for other specified aftercare: Secondary | ICD-10-CM | POA: Diagnosis not present

## 2021-09-16 DIAGNOSIS — Z171 Estrogen receptor negative status [ER-]: Secondary | ICD-10-CM

## 2021-09-16 DIAGNOSIS — Z5112 Encounter for antineoplastic immunotherapy: Secondary | ICD-10-CM | POA: Diagnosis not present

## 2021-09-16 DIAGNOSIS — C50812 Malignant neoplasm of overlapping sites of left female breast: Secondary | ICD-10-CM

## 2021-09-16 DIAGNOSIS — I808 Phlebitis and thrombophlebitis of other sites: Secondary | ICD-10-CM | POA: Diagnosis not present

## 2021-09-16 DIAGNOSIS — R110 Nausea: Secondary | ICD-10-CM | POA: Diagnosis not present

## 2021-09-16 DIAGNOSIS — Z5111 Encounter for antineoplastic chemotherapy: Secondary | ICD-10-CM | POA: Diagnosis not present

## 2021-09-16 LAB — CBC WITH DIFFERENTIAL/PLATELET
Abs Immature Granulocytes: 0.86 10*3/uL — ABNORMAL HIGH (ref 0.00–0.07)
Basophils Absolute: 0.1 10*3/uL (ref 0.0–0.1)
Basophils Relative: 1 %
Eosinophils Absolute: 0.1 10*3/uL (ref 0.0–0.5)
Eosinophils Relative: 1 %
HCT: 36.9 % (ref 36.0–46.0)
Hemoglobin: 11.6 g/dL — ABNORMAL LOW (ref 12.0–15.0)
Immature Granulocytes: 6 %
Lymphocytes Relative: 11 %
Lymphs Abs: 1.6 10*3/uL (ref 0.7–4.0)
MCH: 26.2 pg (ref 26.0–34.0)
MCHC: 31.4 g/dL (ref 30.0–36.0)
MCV: 83.5 fL (ref 80.0–100.0)
Monocytes Absolute: 0.1 10*3/uL (ref 0.1–1.0)
Monocytes Relative: 1 %
Neutro Abs: 12.1 10*3/uL — ABNORMAL HIGH (ref 1.7–7.7)
Neutrophils Relative %: 80 %
Platelets: 219 10*3/uL (ref 150–400)
RBC: 4.42 MIL/uL (ref 3.87–5.11)
RDW: 14.4 % (ref 11.5–15.5)
WBC: 14.9 10*3/uL — ABNORMAL HIGH (ref 4.0–10.5)
nRBC: 0 % (ref 0.0–0.2)

## 2021-09-16 LAB — COMPREHENSIVE METABOLIC PANEL
ALT: 26 U/L (ref 0–44)
AST: 24 U/L (ref 15–41)
Albumin: 3.5 g/dL (ref 3.5–5.0)
Alkaline Phosphatase: 87 U/L (ref 38–126)
Anion gap: 6 (ref 5–15)
BUN: 11 mg/dL (ref 6–20)
CO2: 29 mmol/L (ref 22–32)
Calcium: 8.3 mg/dL — ABNORMAL LOW (ref 8.9–10.3)
Chloride: 103 mmol/L (ref 98–111)
Creatinine, Ser: 0.74 mg/dL (ref 0.44–1.00)
GFR, Estimated: >60 mL/min (ref >60)
Glucose, Bld: 105 mg/dL — ABNORMAL HIGH (ref 70–99)
Potassium: 3.5 mmol/L (ref 3.5–5.1)
Sodium: 138 mmol/L (ref 135–145)
Total Bilirubin: 0.5 mg/dL (ref 0.3–1.2)
Total Protein: 6.9 g/dL (ref 6.5–8.1)

## 2021-09-16 MED ORDER — SODIUM CHLORIDE 0.9 % IV SOLN
INTRAVENOUS | Status: DC
  Filled 2021-09-16 (×2): qty 250

## 2021-09-16 NOTE — Progress Notes (Signed)
Nutrition Assessment   Reason for Assessment:  Patient with breast cancer starting treatment   ASSESSMENT:  61 year old female with left breast cancer.  Past medical history of DM, GERD, bradycardia.  Patient receiving chemotherapy.   Met with patient during fluids in [facility].  Patient reports that she experienced nausea and diarrhea following treatment.  Was able to take nausea medication and imodium for diarrhea.  Breakfast is usually oatmeal, banana or frozen waffle or cinnamon sticks.  Lunch today able to eat grilled chicken sandwich with little mayo for lunch.  Dinner last night was roast beef, green beans and macaroni and cheese.  Has a sensitive stomach (diarrhea, reflux).  Unable to tolerate diary products. Drinks almond milk and lactaid milk.  Has not tried oral nutrition supplements. Eggs but not always hard boiled eggs cause her diarrhea.    Reports has been on trulicity for about 6 months.  Has had change in appetite and no longer desires sodas, candy, junk foods.  She has had weight loss from medication   Medications: zofran, compazine, imodium, dexamethasone, protonix, trulicity   Labs: glucose 105   Anthropometrics:   Height: 66 inches Weight: 216 lb 240 lb 3/16 BMI: 34  10% weight loss in the last 5 months months likely related to medication.   NUTRITION DIAGNOSIS: Inadequate oral intake related to cancer related treatment side effects as evidenced by poor po intake, nausea, diarrhea   INTERVENTION:  Reviewed foods to choose with nausea and diarrhea.  Handout provided Encouraged utilizing medications for nausea and diarrhea Samples of ensure complete, glucerna, ensure clear and orgain diary free shake given to patient to try.  Discussed lower sugar option shake.     MONITORING, EVALUATION, GOAL: weight trends, intake   Next Visit: Wednesday, Sept 6 during infusion  Qasim Diveley B. Zenia Resides, [place], [place] Registered Dietitian [phone]

## 2021-09-16 NOTE — Progress Notes (Signed)
[place] CSW Progress Note  Clinical Education officer, museum contacted patient by phone to discuss financial assistance.  CSW updated patient that I had left two voicemail for landlord at contact  number she gave me.  Patient gave CSW another contact number for landlord Ms. Speight, [phone], who confirmed the patient's rental is 600.00 per month. CSW will send referral request for financial assistance from [employer].    Adelene Amas, LCSW

## 2021-09-16 NOTE — Progress Notes (Signed)
Symptom Management and Golden at [facility] [phone] [phone]  Patient Care Team: [facility] as PCP - General Daiva Huge, RN as Oncology Nurse Navigator   NAME OF PATIENT: Vanessa Fisher  [id number]  26-Jun-1960   DATE OF VISIT: 09/16/21  REASON FOR CONSULT: PALMA BUSTER is a 61 y.o. 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   INTERVAL HISTORY: Patient received cycle 1 TCHP chemotherapy on 09/12/2021.  She presents to [facility] today for follow-up and supportive care.  She has had subsequent nausea since receiving chemotherapy.  No vomiting.  Denies any neurologic complaints. Denies recent fevers or illnesses. Denies any easy bleeding or bruising. Reports good appetite and denies weight loss. Denies chest pain. Denies any nausea, vomiting, constipation, or diarrhea. Denies urinary complaints. Patient offers no further specific complaints today.  SOCIAL HISTORY:     reports that she has never smoked. She has never used smokeless tobacco. She reports that she does not drink alcohol and does not use drugs.  Patient is unmarried.  She lives at home alone.  Her oldest son died of cancer.  She has a son and daughter who live nearby.  She also has a sister who is involved.  Patient previously worked at [employer].  ADVANCE DIRECTIVES:  Does not have  CODE STATUS:   PAST MEDICAL HISTORY: Past Medical History:  Diagnosis Date   Anemia    Anxiety    Arthritis    Asthma    WELL CONTROLLED   Bile acid malabsorption syndrome    Bradycardia    HAD AN ISSUE WITH THIS IN 2016-NO PROBLEMS SINCE   Depression    Diabetes mellitus without complication (HCC)    Diverticulitis    Gastric reflux    GERD (gastroesophageal reflux disease)    Hand pain 05/12/2016   Headache    H/O MIGRAINES   History of kidney stones    H/O   Neck  pain, chronic    Panic attacks     PAST SURGICAL HISTORY:  Past Surgical History:  Procedure Laterality Date   BREAST BIOPSY Left 08/21/2021   Axilla Bx, Hydromarker, path pending   BREAST BIOPSY Left 08/21/2021   [place] Bx, Ribbon Clip, Path Pending   CARPAL TUNNEL RELEASE Right 12/06/2017   Procedure: CARPAL TUNNEL RELEASE;  Surgeon: Earnestine Leys, MD;  Location: [facility] ORS;  Service: Orthopedics;  Laterality: Right;   COLONOSCOPY WITH PROPOFOL N/A 06/11/2021   Procedure: COLONOSCOPY WITH PROPOFOL;  Surgeon: Lin Landsman, MD;  Location: [facility] ENDOSCOPY;  Service: Gastroenterology;  Laterality: N/A;   DORSAL COMPARTMENT RELEASE Right 12/06/2017   Procedure: RELEASE DORSAL COMPARTMENT (DEQUERVAIN);  Surgeon: Earnestine Leys, MD;  Location: [facility] ORS;  Service: Orthopedics;  Laterality: Right;   ESOPHAGOGASTRODUODENOSCOPY (EGD) WITH PROPOFOL N/A 06/11/2021   Procedure: ESOPHAGOGASTRODUODENOSCOPY (EGD) WITH PROPOFOL;  Surgeon: Lin Landsman, MD;  Location: [facility] ENDOSCOPY;  Service: Gastroenterology;  Laterality: N/A;   PARTIAL HYSTERECTOMY     TUBAL LIGATION      HEMATOLOGY/ONCOLOGY HISTORY:  Oncology History  Breast cancer ([place])  07/31/2021 Imaging   Bilateral diagnostic mammogram and US showed 5 centimeter LEFT breast mass associated with pleomorphic calcifications is suspicious for invasive ductal carcinoma.At least 4 LEFT axillary lymph nodes with abnormal morphology   08/21/2021 Cancer Staging   Staging form: Breast, AJCC 8th Edition - Clinical: Stage IIIB (cT4b, cN3c, cM0, G3, ER-, PR-, HER2+) -  Signed by Yu, Zhou, MD on 08/28/2021 Histologic grading system: 3 grade system  -08/21/21 left breast ultrasound-guided biopsy showed invasive mammary carcinoma, grade 3, ER/PR negative, HER2 positive.  Left axillary lymph node biopsy positive for macro metastatic mammary carcinoma, 8 mm in greatest extent.   08/30/2021 Imaging   MRI brain w wo contrast -No metastatic disease or  acute intracranial abnormality. Essentially normal for age MRI appearance of the brain.    09/03/2021 Echocardiogram   1. Left ventricular ejection fraction, by estimation, is 55 to 60%. Left ventricular ejection fraction by 3D volume is 58 %. The left ventricle has normal function. The left ventricle has no regional wall motion abnormalities. There is mild left  ventricular hypertrophy. Left ventricular diastolic parameters were normal.  2. Right ventricular systolic function is normal. The right ventricular size is normal.  3. The mitral valve is normal in structure. No evidence of mitral valve regurgitation.  4. The aortic valve was not well visualized. Aortic valve regurgitation is not visualized   09/10/2021 Imaging   PET scan showed Large hypermetabolic left breast mass and diffuse skin thickening,consistent with primary breast carcinoma. Hypermetabolic lymphadenopathy in the left axilla, left subpectoral region, and left supraclavicular region, consistent with metastatic disease. No evidence of metastatic disease within the abdomen or pelvis   09/12/2021 -  Chemotherapy   Patient is on Treatment Plan : BREAST  Docetaxel + Carboplatin + Trastuzumab + Pertuzumab  (TCHP) q21d        ALLERGIES:  is allergic to bc powder [aspirin-salicylamide-caffeine], gabapentin, hydrocodone-acetaminophen, latex, penicillin g, penicillins, and shrimp [shellfish allergy].  MEDICATIONS:  Current Outpatient Medications  Medication Sig Dispense Refill   albuterol (PROVENTIL) (2.5 MG/3ML) 0.083% nebulizer solution Take 3 mLs (2.5 mg total) by nebulization every 4 (four) hours as needed for wheezing or shortness of breath. 75 mL 2   albuterol (VENTOLIN HFA) 108 (90 Base) MCG/ACT inhaler Inhale 2 puffs into the lungs every 6 (six) hours as needed for wheezing or shortness of breath. 18 g 0   aspirin 81 MG chewable tablet Chew 81 mg by mouth daily.     FLOVENT DISKUS 250 MCG/ACT AEPB Inhale into the lungs.      fluticasone (FLONASE) 50 MCG/ACT nasal spray Place 2 sprays into both nostrils daily. 15.8 mL 0   lisinopril-hydrochlorothiazide (ZESTORETIC) 10-12.5 MG tablet Take 1 tablet by mouth daily.     loperamide (IMODIUM) 2 MG capsule Take 2 mg by mouth as needed for diarrhea or loose stools.     loratadine (CLARITIN) 10 MG tablet Take 1 tablet (10 mg total) by mouth daily. 30 tablet 2   montelukast (SINGULAIR) 10 MG tablet Take by mouth.     ondansetron (ZOFRAN) 8 MG tablet Take 1 tablet (8 mg total) by mouth 2 (two) times daily as needed (Nausea or vomiting). Start on the third day after chemotherapy. 30 tablet 1   oxyCODONE-acetaminophen (PERCOCET) 10-325 MG tablet Take 1 tablet by mouth 3 (three) times daily as needed.     pantoprazole (PROTONIX) 40 MG tablet Take 40 mg by mouth daily.     prochlorperazine (COMPAZINE) 10 MG tablet Take 1 tablet (10 mg total) by mouth every 6 (six) hours as needed (Nausea or vomiting). 30 tablet 1   TRULICITY 3 MG/0.5ML SOPN SMARTSIG:3 Milligram(s) SUB-Q Once a Week     dexamethasone (DECADRON) 4 MG tablet Take 2 tablets (8 mg total) by mouth daily. Start the day before Taxotere. Then take daily x 2 days after   chemotherapy. (Patient not taking: Reported on 09/16/2021) 30 tablet 1   lidocaine-prilocaine (EMLA) cream Apply 1 Application topically as needed. (Patient not taking: Reported on 09/16/2021) 30 g 12   naloxone (NARCAN) nasal spray 4 mg/0.1 mL  (Patient not taking: Reported on 09/16/2021)     No current facility-administered medications for this visit.    VITAL SIGNS: BP 117/70   Pulse 69   Temp 97.9 F (36.6 C) (Tympanic)   Resp 18  There were no vitals filed for this visit.  Estimated body mass index is 34.86 kg/m as calculated from the following:   Height as of 09/11/21: 5' 6" (1.676 m).   Weight as of 09/11/21: 216 lb (98 kg).  LABS: CBC:    Component Value Date/Time   WBC 14.9 (H) 09/16/2021 1253   HGB 11.6 (L) 09/16/2021 1253   HGB 11.9 (L)  05/27/2014 2215   HCT 36.9 09/16/2021 1253   HCT 36.4 05/27/2014 2215   PLT 219 09/16/2021 1253   PLT 293 05/27/2014 2215   MCV 83.5 09/16/2021 1253   MCV 81 05/27/2014 2215   NEUTROABS 12.1 (H) 09/16/2021 1253   NEUTROABS 5.4 05/27/2014 2215   LYMPHSABS 1.6 09/16/2021 1253   LYMPHSABS 3.0 05/27/2014 2215   MONOABS 0.1 09/16/2021 1253   MONOABS 0.6 05/27/2014 2215   EOSABS 0.1 09/16/2021 1253   EOSABS 0.0 05/27/2014 2215   BASOSABS 0.1 09/16/2021 1253   BASOSABS 0.0 05/27/2014 2215   Comprehensive Metabolic Panel:    Component Value Date/Time   NA 140 09/11/2021 1325   NA 143 07/08/2016 1259   NA 139 05/27/2014 2215   K 3.9 09/11/2021 1325   K 3.5 05/27/2014 2215   CL 109 09/11/2021 1325   CL 105 05/27/2014 2215   CO2 25 09/11/2021 1325   CO2 26 05/27/2014 2215   BUN 13 09/11/2021 1325   BUN 10 07/08/2016 1259   BUN 14 05/27/2014 2215   CREATININE 0.80 09/11/2021 1325   CREATININE 1.09 (H) 05/27/2014 2215   GLUCOSE 106 (H) 09/11/2021 1325   GLUCOSE 104 (H) 05/27/2014 2215   CALCIUM 9.1 09/11/2021 1325   CALCIUM 9.0 05/27/2014 2215   AST 24 09/11/2021 1325   AST < 5 (L) 08/05/2013 0832   ALT 21 09/11/2021 1325   ALT 24 08/05/2013 0832   ALKPHOS 91 09/11/2021 1325   ALKPHOS 97 08/05/2013 0832   BILITOT 0.2 (L) 09/11/2021 1325   BILITOT 0.4 07/08/2016 1259   BILITOT 0.4 08/05/2013 0832   PROT 6.8 09/11/2021 1325   PROT 7.0 07/08/2016 1259   PROT 6.8 08/05/2013 0832   ALBUMIN 3.6 09/11/2021 1325   ALBUMIN 4.0 07/08/2016 1259   ALBUMIN 3.4 08/05/2013 0832    RADIOGRAPHIC STUDIES: NM PET Image Initial (PI) Skull Base To Thigh  Result Date: 09/10/2021 CLINICAL DATA:  Initial treatment strategy for left breast carcinoma. EXAM: NUCLEAR MEDICINE PET SKULL BASE TO THIGH TECHNIQUE: 12.0 mCi F-18 FDG was injected intravenously. Full-ring PET imaging was performed from the skull base to thigh after the radiotracer. CT data was obtained and used for attenuation correction  and anatomic localization. Fasting blood glucose: 101 mg/dl COMPARISON:  AP CT on 04/17/2021 FINDINGS: Mediastinal blood-pool activity (background): SUV max = 2.0 Liver activity (reference): SUV max = N/A NECK:  No hypermetabolic lymph nodes or masses. Incidental CT findings:  None. CHEST: Large multilobular mass and diffuse skin thickening is seen in the left breast. This has SUV max 14.9 consistent primary breast carcinoma. Hypermetabolic   lymphadenopathy is seen in the left axilla measuring up to 3.1 x 2.5 cm with SUV max of 12.2. A few sub-cm left subpectoral lymph nodes are also seen with SUV max of 2.8. A few sub-cm left supraclavicular lymph nodes are also seen which has a SUV max of 3.3. No hypermetabolic mediastinal or hilar lymph nodes identified. No suspicious pulmonary nodules or masses seen on CT images. No evidence of pulmonary infiltrate or pleural effusion. Incidental CT findings:  None. ABDOMEN/PELVIS: No abnormal hypermetabolic activity within the liver, pancreas, adrenal glands, or spleen. No hypermetabolic lymph nodes in the abdomen or pelvis. Incidental CT findings:  None. SKELETON: No focal hypermetabolic bone lesions to suggest skeletal metastasis. Incidental CT findings:  None. IMPRESSION: Large hypermetabolic left breast mass and diffuse skin thickening, consistent with primary breast carcinoma. Hypermetabolic lymphadenopathy in the left axilla, left subpectoral region, and left supraclavicular region, consistent with metastatic disease. No evidence of metastatic disease within the abdomen or pelvis. Electronically Signed   By: John A Stahl M.D.   On: 09/10/2021 14:41   ECHOCARDIOGRAM COMPLETE  Result Date: 09/03/2021    ECHOCARDIOGRAM REPORT   Patient Name:   Bambi D Clyatt Date of Exam: 09/03/2021 Medical Rec #:  [id number]            Height:       66.0 in Accession #:    [id number]           Weight:       216.0 lb Date of Birth:  10/09/1960            BSA:          2.066 m Patient  Age:    61 years             BP:           132/69 mmHg Patient Gender: F                    HR:           57 bpm. Exam Location:  [facility] Procedure: 2D Echo, Cardiac Doppler, Color Doppler and Strain Analysis Indications:     Chemo Z09  History:         Patient has no prior history of Echocardiogram examinations.                  Risk Factors:Diabetes. Bradycardia.  Sonographer:     Jerry Hege Referring Phys:  [id number] ZHOU YU Diagnosing Phys: Brian Agbor-Etang MD  Sonographer Comments: Suboptimal apical window. Global longitudinal strain was attempted. IMPRESSIONS  1. Left ventricular ejection fraction, by estimation, is 55 to 60%. Left ventricular ejection fraction by 3D volume is 58 %. The left ventricle has normal function. The left ventricle has no regional wall motion abnormalities. There is mild left ventricular hypertrophy. Left ventricular diastolic parameters were normal.  2. Right ventricular systolic function is normal. The right ventricular size is normal.  3. The mitral valve is normal in structure. No evidence of mitral valve regurgitation.  4. The aortic valve was not well visualized. Aortic valve regurgitation is not visualized. FINDINGS  Left Ventricle: Left ventricular ejection fraction, by estimation, is 55 to 60%. Left ventricular ejection fraction by 3D volume is 58 %. The left ventricle has normal function. The left ventricle has no regional wall motion abnormalities. Global longitudinal strain performed but not reported based on interpreter judgement due to suboptimal tracking. The left ventricular internal cavity size was normal in size. There   is mild left ventricular hypertrophy. Left ventricular diastolic parameters were  normal. Right Ventricle: The right ventricular size is normal. No increase in right ventricular wall thickness. Right ventricular systolic function is normal. Left Atrium: Left atrial size was normal in size. Right Atrium: Right atrial size was normal in size. Pericardium:  There is no evidence of pericardial effusion. Mitral Valve: The mitral valve is normal in structure. No evidence of mitral valve regurgitation. Tricuspid Valve: The tricuspid valve is normal in structure. Tricuspid valve regurgitation is trivial. Aortic Valve: The aortic valve was not well visualized. Aortic valve regurgitation is not visualized. Aortic valve mean gradient measures 4.0 mmHg. Aortic valve peak gradient measures 6.4 mmHg. Aortic valve area, by VTI measures 2.42 cm. Pulmonic Valve: The pulmonic valve was not well visualized. Pulmonic valve regurgitation is not visualized. Aorta: The aortic root is normal in size and structure. Venous: The inferior vena cava was not well visualized. IAS/Shunts: No atrial level shunt detected by color flow Doppler.  LEFT VENTRICLE PLAX 2D LVIDd:         3.70 cm         Diastology LVIDs:         2.50 cm         LV e' medial:    7.94 cm/s LV PW:         1.20 cm         LV E/e' medial:  11.6 LV IVS:        1.10 cm         LV e' lateral:   10.20 cm/s LVOT diam:     2.00 cm         LV E/e' lateral: 9.1 LV SV:         70 LV SV Index:   34 LVOT Area:     3.14 cm        3D Volume EF                                LV 3D EF:    Left                                             ventricul                                             ar                                             ejection                                             fraction                                             by 3D  volume is                                             58 %.                                 3D Volume EF:                                3D EF:        58 %                                LV EDV:       243 ml                                LV ESV:       103 ml                                LV SV:        140 ml RIGHT VENTRICLE RV Basal diam:  3.70 cm RV S prime:     11.90 cm/s TAPSE (M-mode): 2.2 cm LEFT ATRIUM             Index        RIGHT ATRIUM            Index LA diam:        3.10 cm 1.50 cm/m   RA Area:     12.80 cm LA Vol (A2C):   60.0 ml 29.04 ml/m  RA Volume:   31.30 ml  15.15 ml/m LA Vol (A4C):   31.6 ml 15.29 ml/m LA Biplane Vol: 44.5 ml 21.54 ml/m  AORTIC VALVE AV Area (Vmax):    2.54 cm AV Area (Vmean):   2.12 cm AV Area (VTI):     2.42 cm AV Vmax:           126.00 cm/s AV Vmean:          96.100 cm/s AV VTI:            0.288 m AV Peak Grad:      6.4 mmHg AV Mean Grad:      4.0 mmHg LVOT Vmax:         102.00 cm/s LVOT Vmean:        64.800 cm/s LVOT VTI:          0.222 m LVOT/AV VTI ratio: 0.77  AORTA Ao Root diam: 2.53 cm MITRAL VALVE               TRICUSPID VALVE MV Area (PHT): 4.46 cm    TR Peak grad:   16.3 mmHg MV Decel Time: 170 msec    TR Vmax:        202.00 cm/s MV E velocity: 92.50 cm/s MV A velocity: 60.00 cm/s  SHUNTS MV E/A ratio:  1.54        Systemic VTI:  0.22 m                              Systemic Diam: 2.00 cm Brian Agbor-Etang MD Electronically signed by Brian Agbor-Etang MD Signature Date/Time: 09/03/2021/3:18:34 PM    Final    MR Brain W Wo Contrast  Result Date: 08/30/2021 CLINICAL DATA:  60-year-old female with head CT 04/01/2013. Cervical spine MRI 11/09/2012. Breast cancer diagnosed last month. Staging. EXAM: MRI HEAD WITHOUT AND WITH CONTRAST TECHNIQUE: Multiplanar, multiecho pulse sequences of the brain and surrounding structures were obtained without and with intravenous contrast. CONTRAST:  10mL GADAVIST GADOBUTROL 1 MMOL/ML IV SOLN COMPARISON:  None Available. FINDINGS: Brain: Normal cerebral volume. No midline shift, mass effect, or evidence of intracranial mass lesion. No abnormal enhancement identified. No dural thickening identified. Partially empty sella. No restricted diffusion to suggest acute infarction. No ventriculomegaly, extra-axial collection or acute intracranial hemorrhage. Cervicomedullary junction is within normal limits. Essentially normal for age gray and white matter signal throughout the brain;  there is a solitary nonspecific, nonenhancing small left centrum semiovale white matter T2 and FLAIR hyperintensity on series 15, image 35. No cortical encephalomalacia or chronic cerebral blood products identified. Vascular: Major intracranial vascular flow voids are preserved. The major dural venous sinuses are enhancing and appear to be patent. Skull and upper cervical spine: Visualized bone marrow signal is within normal limits. Negative visible cervical spine. Sinuses/Orbits: Negative; trace paranasal sinus mucosal thickening. Other: Mastoids are clear. Visible internal auditory structures appear normal. Negative visible scalp and face. IMPRESSION: No metastatic disease or acute intracranial abnormality. Essentially normal for age MRI appearance of the brain. Electronically Signed   By: H  Hall M.D.   On: 08/30/2021 11:44   US AXILLARY NODE CORE BIOPSY LEFT  Addendum Date: 08/25/2021   ADDENDUM REPORT: 08/25/2021 14:30 ADDENDUM: PATHOLOGY revealed: Site A. BREAST, LEFT AT 5:00, RETROAREOLAR; ULTRASOUND-GUIDED CORE NEEDLE BIOPSY: - INVASIVE MAMMARY CARCINOMA, NO SPECIAL TYPE. Size of invasive carcinoma: 15 mm in this sample. Grade 3. Ductal carcinoma in situ: Present, high-grade with comedonecrosis and calcifications. Lymphovascular invasion: Not identified. Pathology results are CONCORDANT with imaging findings, per Dr. Tyler Litton. PATHOLOGY revealed: Site B. 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 Pathology results are CONCORDANT with imaging findings, per Dr. Tyler Litton. Pathology results and recommendations below were discussed with patient by telephone on 08/25/2021. Patient reported biopsy site within normal limits with slight tenderness at the site. Post biopsy care instructions were reviewed, questions were answered and my direct phone number was provided to patient. Patient was instructed to call  [facility] if any concerns or questions arise related to the biopsy. RECOMMENDATIONS: Surgical and oncological consultation. Request for surgical and oncological consultation relayed to Ana Moore RN at [facility] by Linda Nash RN on 08/25/2021. Pathology results reported by Linda Nash RN on 08/25/2021. Electronically Signed   By: Stephanie  Peacock M.D.   On: 08/25/2021 14:30   Result Date: 08/25/2021 CLINICAL DATA:  Here for ultrasound-guided two-site biopsy of a mass in the left breast and a lymph node in the left axilla. EXAM: ULTRASOUND GUIDED LEFT BREAST CORE NEEDLE BIOPSY ULTRASOUND-GUIDED LEFT AXILLARY LYMPH NODE BIOPSY COMPARISON:  Previous exam(s). PROCEDURE: I met with the patient and we discussed the procedure of ultrasound-guided biopsy, including benefits and alternatives. We discussed the high likelihood of a successful procedure. We discussed the risks of the procedure, including infection, bleeding, tissue injury, clip migration, and inadequate sampling. Informed written consent was given. The usual time-out protocol was performed immediately prior to the procedure. Site   1- Lesion quadrant: Lower outer quadrant Using sterile technique and 1% Lidocaine as local anesthetic, under direct ultrasound visualization, a 12 gauge spring-loaded device was used to perform biopsy of an irregular hypoechoic mass in the retroareolar left breast at 5 o'clock using a lateral approach. At the conclusion of the procedure a ribbon shaped tissue marker clip was deployed into the biopsy cavity. Follow up 2 view mammogram was performed and dictated separately. Site 2- Lesion quadrant: Lower outer quadrant Using sterile technique and 1% Lidocaine as local anesthetic, under direct ultrasound visualization, a 12 gauge spring-loaded device was used to perform biopsy of an enlarged left axillary lymph node using a lateral approach. At the conclusion of the procedure an open coil shaped tissue  marker clip was deployed into the biopsy cavity. Follow up 2 view mammogram was performed and dictated separately. IMPRESSION: Ultrasound guided two-site biopsy of a mass in the left breast and a lymph node in the left axilla. No apparent complications. Electronically Signed: By: Zerita Boers M.D. On: 08/21/2021 10:58   [place] LT BREAST BX W LOC DEV 1ST LESION IMG BX SPEC US GUIDE  Addendum Date: 08/25/2021   ADDENDUM REPORT: 08/25/2021 14:30 ADDENDUM: PATHOLOGY revealed: Site A. BREAST, LEFT AT 5:00, RETROAREOLAR; ULTRASOUND-GUIDED CORE NEEDLE BIOPSY: - INVASIVE MAMMARY CARCINOMA, NO SPECIAL TYPE. Size of invasive carcinoma: 15 mm in this sample. Grade 3. Ductal carcinoma in situ: Present, high-grade with comedonecrosis and calcifications. Lymphovascular invasion: Not identified. Pathology results are CONCORDANT with imaging findings, per Dr. Zerita Boers. PATHOLOGY revealed: Site B. 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 Pathology results are CONCORDANT with imaging findings, per Dr. Zerita Boers. Pathology results and recommendations below were discussed with patient by telephone on 08/25/2021. Patient reported biopsy site within normal limits with slight tenderness at the site. Post biopsy care instructions were reviewed, questions were answered and my direct phone number was provided to patient. Patient was instructed to call [facility] if any concerns or questions arise related to the biopsy. RECOMMENDATIONS: Surgical and oncological consultation. Request for surgical and oncological consultation relayed to Casper Harrison RN at [facility] by Electa Sniff RN on 08/25/2021. Pathology results reported by Electa Sniff RN on 08/25/2021. Electronically Signed   By: Valentino Saxon M.D.   On: 08/25/2021 14:30   Result Date: 08/25/2021 CLINICAL DATA:  Here for ultrasound-guided two-site biopsy  of a mass in the left breast and a lymph node in the left axilla. EXAM: ULTRASOUND GUIDED LEFT BREAST CORE NEEDLE BIOPSY ULTRASOUND-GUIDED LEFT AXILLARY LYMPH NODE BIOPSY COMPARISON:  Previous exam(s). PROCEDURE: I met with the patient and we discussed the procedure of ultrasound-guided biopsy, including benefits and alternatives. We discussed the high likelihood of a successful procedure. We discussed the risks of the procedure, including infection, bleeding, tissue injury, clip migration, and inadequate sampling. Informed written consent was given. The usual time-out protocol was performed immediately prior to the procedure. Site 1- Lesion quadrant: Lower outer quadrant Using sterile technique and 1% Lidocaine as local anesthetic, under direct ultrasound visualization, a 12 gauge spring-loaded device was used to perform biopsy of an irregular hypoechoic mass in the retroareolar left breast at 5 o'clock using a lateral approach. At the conclusion of the procedure a ribbon shaped tissue marker clip was deployed into the biopsy cavity. Follow up 2 view mammogram was performed and dictated separately. Site 2- Lesion quadrant: Lower outer quadrant Using sterile technique and  1% Lidocaine as local anesthetic, under direct ultrasound visualization, a 12 gauge spring-loaded device was used to perform biopsy of an enlarged left axillary lymph node using a lateral approach. At the conclusion of the procedure an open coil shaped tissue marker clip was deployed into the biopsy cavity. Follow up 2 view mammogram was performed and dictated separately. IMPRESSION: Ultrasound guided two-site biopsy of a mass in the left breast and a lymph node in the left axilla. No apparent complications. Electronically Signed: By: Tyler  Litton M.D. On: 08/21/2021 10:58   MM CLIP PLACEMENT LEFT  Result Date: 08/21/2021 CLINICAL DATA:  Status post ultrasound-guided biopsy of a mass in the left breast and a lymph node in the left axilla.  EXAM: 3D DIAGNOSTIC LEFT MAMMOGRAM POST ULTRASOUND BIOPSY COMPARISON:  Previous exam(s). FINDINGS: 3D Mammographic images were obtained following ultrasound guided biopsy of a mass in the left breast and a lymph node in the left axilla. The biopsy marking clips are in expected position at the sites of biopsy. IMPRESSION: Appropriate positioning of the ribbon shaped biopsy marking clip at the site of biopsy in the left breast as well as the open coil shaped biopsy marking clip at the site of biopsy in the left axilla. Final Assessment: Post Procedure Mammograms for Marker Placement Electronically Signed   By: Tyler  Litton M.D.   On: 08/21/2021 12:57   PERFORMANCE STATUS (ECOG) : 1 - Symptomatic but completely ambulatory  Review of Systems Unless otherwise noted, a complete review of systems is negative.  Physical Exam General: NAD Cardiovascular: regular rate and rhythm Pulmonary: clear ant fields Abdomen: soft, nontender, + bowel sounds GU: no suprapubic tenderness Extremities: no edema, no joint deformities Skin: no rashes Neurological: Weakness but otherwise nonfocal  IMPRESSION: Patient had some nausea without vomiting since she received her last chemotherapy.  Otherwise, she appears to be tolerating well.  Labs grossly benign as is her exam today.  Discussed role of taking scheduled antiemetics for nausea.  She is currently taking these sporadically.  We will proceed with IV fluids today.  She may require intermittent lab/IV fluids/supportive care following chemotherapy treatments.   PLAN: -Continue current scope of treatment -IV fluids today -Continue home antiemetics -Follow-up telephone visit 2 weeks   Patient expressed understanding and was in agreement with this plan. She also understands that She can call clinic at any time with any questions, concerns, or complaints.   Thank you for allowing me to participate in the care of this very pleasant patient.   Time Total: 15  minutes  Visit consisted of counseling and education dealing with the complex and emotionally intense issues of symptom management in the setting of serious illness.Greater than 50%  of this time was spent counseling and coordinating care related to the above assessment and plan.  Signed by: Joshua Borders, PhD, NP-C     

## 2021-09-17 ENCOUNTER — Inpatient Hospital Stay: Payer: Medicare HMO

## 2021-09-17 ENCOUNTER — Telehealth: Payer: Self-pay | Admitting: *Deleted

## 2021-09-17 ENCOUNTER — Inpatient Hospital Stay: Payer: Medicare HMO | Admitting: Occupational Therapy

## 2021-09-17 DIAGNOSIS — T451X5A Adverse effect of antineoplastic and immunosuppressive drugs, initial encounter: Secondary | ICD-10-CM

## 2021-09-17 MED ORDER — MAGIC MOUTHWASH W/LIDOCAINE: 5.0000 mL | Freq: Four times a day (QID) | ORAL | 1 refills | Status: DC | PRN

## 2021-09-17 NOTE — Telephone Encounter (Signed)
Patient called reporting that she is having sore irritated throat today worse when swallowing. Please advise

## 2021-09-17 NOTE — Telephone Encounter (Signed)
Patient saw Gwenette Greet this morning and voiced same concerns. Will send magic mouthwash to pharmacy per MD request.

## 2021-09-17 NOTE — Therapy (Signed)
[facility] [place], [place], [place], [address] Phone: [phone]   Fax:  [phone]  Occupational Therapy Screen:  Patient Details  Name: Vanessa Fisher MRN: [id number] Date of Birth: 1960/03/04 No data recorded  Encounter Date: 09/17/2021   OT End of Session - 09/17/21 1145     Visit Number 0             Past Medical History:  Diagnosis Date   Anemia    Anxiety    Arthritis    Asthma    WELL CONTROLLED   Bile acid malabsorption syndrome    Bradycardia    HAD AN ISSUE WITH THIS IN 2016-NO PROBLEMS SINCE   Depression    Diabetes mellitus without complication ([place])    Diverticulitis    Gastric reflux    GERD (gastroesophageal reflux disease)    Hand pain 05/12/2016   Headache    H/O MIGRAINES   History of kidney stones    H/O   Neck pain, chronic    Panic attacks     Past Surgical History:  Procedure Laterality Date   BREAST BIOPSY Left 08/21/2021   Axilla Bx, Hydromarker, path pending   BREAST BIOPSY Left 08/21/2021   [place] Bx, Ribbon Clip, Path Pending   CARPAL TUNNEL RELEASE Right 12/06/2017   Procedure: CARPAL TUNNEL RELEASE;  Surgeon: Earnestine Leys, MD;  Location: [facility] ORS;  Service: Orthopedics;  Laterality: Right;   COLONOSCOPY WITH PROPOFOL N/A 06/11/2021   Procedure: COLONOSCOPY WITH PROPOFOL;  Surgeon: Lin Landsman, MD;  Location: [facility] ENDOSCOPY;  Service: Gastroenterology;  Laterality: N/A;   DORSAL COMPARTMENT RELEASE Right 12/06/2017   Procedure: RELEASE DORSAL COMPARTMENT (DEQUERVAIN);  Surgeon: Earnestine Leys, MD;  Location: [facility] ORS;  Service: Orthopedics;  Laterality: Right;   ESOPHAGOGASTRODUODENOSCOPY (EGD) WITH PROPOFOL N/A 06/11/2021   Procedure: ESOPHAGOGASTRODUODENOSCOPY (EGD) WITH PROPOFOL;  Surgeon: Lin Landsman, MD;  Location: [facility] ENDOSCOPY;  Service: Gastroenterology;  Laterality: N/A;   PARTIAL HYSTERECTOMY     TUBAL LIGATION      There were  no vitals filed for this visit.   Subjective Assessment - 09/17/21 1144     Subjective  I am doing okay I gave him some fluids yesterday.  Just feel little tired today and my throat hurts.  I did give him a call this morning and left a message.  I am still going to work about 2 hours a day until the end of the month    Currently in Pain? Yes    Pain Score --   No number provided   Pain Location Throat    Pain Descriptors / Indicators Sore              IMPRESSION Josh Borders visit 09/16/21: Patient had some nausea without vomiting since she received her last chemotherapy.  Otherwise, she appears to be tolerating well.  Labs grossly benign as is her exam today.   Discussed role of taking scheduled antiemetics for nausea.  She is currently taking these sporadically.  We will proceed with IV fluids today.  She may require intermittent lab/IV fluids/supportive care following chemotherapy treatments.   PLAN: -Continue current scope of treatment -IV fluids today -Continue home antiemetics -Follow-up telephone visit 2 weeks  OT SCREEN 09/17/21: Patient presented Ultra-Screen post referral from [facility] meeting. Patient had first chemo treatment 2 days ago.  Patient had fluids yesterday and this date report increased fatigue and sore throat.  Dr. Tasia Catchings will  send in prescription for magical mouthwash. BERG balance test was done.  Patient scored 56/56. Did provide patient and education about keeping her activity level up even through chemo. Want to be active about 150 minutes a week.  Divide that in 22-24 minutes plus minus a day Or can divide that into sessions of 12 to 14 minutes a day or 4 x 6 to 8 minutes today.  HEP :  Practice sit to stand 10 reps without hands Sidestepping at kitchen counter 1-2 minutes Heel raises 10 reps Walking even surface. Handout provided.  RECOMMEND CARE program as option to keep strength and endurance up Patient to follow-up with me as needed otherwise patient  to follow-up if she is going to have surgery post chemo.                                    :           Visit Diagnosis: Chemotherapy-induced fatigue    Problem List Patient Active Problem List   Diagnosis Date Noted   Encounter for antineoplastic chemotherapy 09/12/2021   Encounter for monitoring cardiotoxic drug therapy 08/28/2021   Goals of care, counseling/discussion 07/26/2021   Breast cancer ([place]) 07/18/2021   Carpal tunnel syndrome of right wrist 12/06/2017   Radial styloid tenosynovitis 08/11/2017   Lumbar facet osteoarthritis 11/10/2016   Chronic musculoskeletal pain 11/05/2016   Lumbar radiculitis (Right) (L4) 09/28/2016   Vitamin D insufficiency 07/30/2016   Chronic pain syndrome 07/08/2016   Long term (current) use of opiate analgesic 07/08/2016   Long term prescription opiate use 07/08/2016   Opiate use (30 MME/day) 07/08/2016   Chronic low back pain (Primary Source of Pain) (midline) ( (Bilateral) (L>R) 07/08/2016   Disturbance of skin sensation 07/08/2016   Chronic neck pain (Secondary source of pain) (midline) (Bilateral) (L>R) 07/08/2016   DDD (degenerative disc disease), cervical 07/08/2016   DDD (degenerative disc disease), lumbar 07/08/2016   Cervical spondylosis 07/08/2016   Cervical radicular pain (Right) (C7/C8) 07/08/2016   Upper extremity numbness (Right) 07/08/2016   Cervical facet syndrome (Bilateral) (L>R) 07/08/2016   Osteoarthritis of knee (Bilateral) (R>L) 07/08/2016   Chronic lower extremity pain (Right) 07/08/2016   Lumbar facet syndrome (Bilateral) (L>R) 07/08/2016   Grade 1 Anterolisthesis of L4 over L5 (3 mm) 07/08/2016   Grade 1 Retrolisthesis of L5 over S1 (5 mm) 07/08/2016   Chronic knee pain [facility] source of pain) (Bilateral) (R>L) 05/12/2016    Rosalyn Gess, OTR/L,CLT 09/17/2021, 11:45 AM  [facility] [address] [place], [place], [address] Phone: [phone]   Fax:  [phone]  Name: PAIZLEE KINDER MRN: [id number] Date of Birth: 11/08/1960

## 2021-09-18 ENCOUNTER — Encounter: Payer: Self-pay | Admitting: Licensed Clinical Social Worker

## 2021-09-18 ENCOUNTER — Ambulatory Visit: Payer: Self-pay | Admitting: General Surgery

## 2021-09-18 ENCOUNTER — Inpatient Hospital Stay: Payer: Medicare HMO

## 2021-09-18 ENCOUNTER — Inpatient Hospital Stay (HOSPITAL_BASED_OUTPATIENT_CLINIC_OR_DEPARTMENT_OTHER): Payer: Medicare HMO | Admitting: Licensed Clinical Social Worker

## 2021-09-18 DIAGNOSIS — Z171 Estrogen receptor negative status [ER-]: Secondary | ICD-10-CM | POA: Diagnosis not present

## 2021-09-18 DIAGNOSIS — Z801 Family history of malignant neoplasm of trachea, bronchus and lung: Secondary | ICD-10-CM | POA: Diagnosis not present

## 2021-09-18 DIAGNOSIS — Z803 Family history of malignant neoplasm of breast: Secondary | ICD-10-CM | POA: Diagnosis not present

## 2021-09-18 DIAGNOSIS — C50812 Malignant neoplasm of overlapping sites of left female breast: Secondary | ICD-10-CM

## 2021-09-18 DIAGNOSIS — Z800 Family history of malignant neoplasm of digestive organs: Secondary | ICD-10-CM | POA: Diagnosis not present

## 2021-09-18 DIAGNOSIS — C50919 Malignant neoplasm of unspecified site of unspecified female breast: Secondary | ICD-10-CM | POA: Diagnosis not present

## 2021-09-18 NOTE — Progress Notes (Signed)
REFERRING PROVIDER: Borders, Kirt Boys, NP [place],  [place] [address]  PRIMARY PROVIDER:  [place], [place]  PRIMARY REASON FOR VISIT:  1. Malignant neoplasm of overlapping sites of left breast in female, estrogen receptor negative ([place])   2. Family history of breast cancer   3. Family history of pancreatic cancer   4. Family history of lung cancer      HISTORY OF PRESENT ILLNESS:   [name], a 61 y.o. female, was seen for a [place] cancer genetics consultation due to a personal and family history of cancer.  [name] presents to clinic today to discuss the possibility of a hereditary predisposition to cancer, genetic testing, and to further clarify her future cancer risks, as well as potential cancer risks for family members.   In 2023, at the age of 53, [name] was diagnosed with invasive mammary carcinoma of the left breast, ER/PR-, Her2+. The treatment plan includes neoadjuvant chemotherapy.   CANCER HISTORY:  Oncology History  Breast cancer ([place])  07/31/2021 Imaging   Bilateral diagnostic mammogram and US showed 5 centimeter LEFT breast mass associated with pleomorphic calcifications is suspicious for invasive ductal carcinoma.At least 4 LEFT axillary lymph nodes with abnormal morphology   08/21/2021 Cancer Staging   Staging form: Breast, AJCC 8th Edition - Clinical: Stage IIIB (cT4b, cN3c, cM0, G3, ER-, PR-, HER2+) - Signed by Earlie Server, MD on 08/28/2021 Histologic grading system: 3 grade system  -08/21/21 left breast ultrasound-guided biopsy showed invasive mammary carcinoma, grade 3, ER/PR negative, HER2 positive.  Left axillary lymph node biopsy positive for macro metastatic mammary carcinoma, 8 mm in greatest extent.   08/30/2021 Imaging   MRI brain w wo contrast -No metastatic disease or acute intracranial abnormality. Essentially normal for age MRI appearance of the brain.    09/03/2021 Echocardiogram   1. Left ventricular  ejection fraction, by estimation, is 55 to 60%. Left ventricular ejection fraction by 3D volume is 58 %. The left ventricle has normal function. The left ventricle has no regional wall motion abnormalities. There is mild left  ventricular hypertrophy. Left ventricular diastolic parameters were normal.  2. Right ventricular systolic function is normal. The right ventricular size is normal.  3. The mitral valve is normal in structure. No evidence of mitral valve regurgitation.  4. The aortic valve was not well visualized. Aortic valve regurgitation is not visualized   09/10/2021 Imaging   PET scan showed Large hypermetabolic left breast mass and diffuse skin thickening,consistent with primary breast carcinoma. Hypermetabolic lymphadenopathy in the left axilla, left subpectoral region, and left supraclavicular region, consistent with metastatic disease. No evidence of metastatic disease within the abdomen or pelvis   09/12/2021 -  Chemotherapy   Patient is on Treatment Plan : BREAST  Docetaxel + Carboplatin + Trastuzumab + Pertuzumab  (TCHP) q21d        RISK FACTORS:  Menarche was at age 60.  First live birth at age 32.  OCP use for approximately 10+ years.  Ovaries intact: has right ovary.  Hysterectomy: yes.  Menopausal status: postmenopausal.  HRT use: 0 years. Colonoscopy: yes; normal.  Past Medical History:  Diagnosis Date   Anemia    Anxiety    Arthritis    Asthma    WELL CONTROLLED   Bile acid malabsorption syndrome    Bradycardia    HAD AN ISSUE WITH THIS IN 2016-NO PROBLEMS SINCE   Depression    Diabetes mellitus without complication ([facility])    Diverticulitis  Gastric reflux    GERD (gastroesophageal reflux disease)    Hand pain 05/12/2016   Headache    H/O MIGRAINES   History of kidney stones    H/O   Neck pain, chronic    Panic attacks     Past Surgical History:  Procedure Laterality Date   BREAST BIOPSY Left 08/21/2021   Axilla Bx, Hydromarker, path pending    BREAST BIOPSY Left 08/21/2021   [place] Bx, Ribbon Clip, Path Pending   CARPAL TUNNEL RELEASE Right 12/06/2017   Procedure: CARPAL TUNNEL RELEASE;  Surgeon: Earnestine Leys, MD;  Location: [facility] ORS;  Service: Orthopedics;  Laterality: Right;   COLONOSCOPY WITH PROPOFOL N/A 06/11/2021   Procedure: COLONOSCOPY WITH PROPOFOL;  Surgeon: Lin Landsman, MD;  Location: [facility] ENDOSCOPY;  Service: Gastroenterology;  Laterality: N/A;   DORSAL COMPARTMENT RELEASE Right 12/06/2017   Procedure: RELEASE DORSAL COMPARTMENT (DEQUERVAIN);  Surgeon: Earnestine Leys, MD;  Location: [facility] ORS;  Service: Orthopedics;  Laterality: Right;   ESOPHAGOGASTRODUODENOSCOPY (EGD) WITH PROPOFOL N/A 06/11/2021   Procedure: ESOPHAGOGASTRODUODENOSCOPY (EGD) WITH PROPOFOL;  Surgeon: Lin Landsman, MD;  Location: [facility] ENDOSCOPY;  Service: Gastroenterology;  Laterality: N/A;   PARTIAL HYSTERECTOMY     TUBAL LIGATION      FAMILY HISTORY:  We obtained a detailed, 4-generation family history.  Significant diagnoses are listed below: Family History  Problem Relation Age of Onset   Diabetes Mother    Hypertension Mother    Heart failure Mother    Pancreatic cancer Mother 50   Diabetes Father    Hypertension Father    Breast cancer Maternal Aunt    Cervical cancer Maternal Aunt    Lung cancer Son 69   Ms. Jann had 2 sons and 1 daughter. One of her sons passed at 50 and had lung cancer on his autopsy. Patient has 2 sisters, no cancers.  Ms. Hewson mother passed of pancreatic cancer at 72. Patient had 3 maternal aunts, 4 uncles. One aunt had breast cancer and another aunt had cervical cancer.   Ms. Harlan father is living at 37, patient has limited information about this side of the family.  Ms. Kivi is unaware of previous family history of genetic testing for hereditary cancer risks. There is no reported Ashkenazi Jewish ancestry. There is no known consanguinity.    GENETIC COUNSELING  ASSESSMENT: Ms. Hoeger is a 61 y.o. female with a personal and family history of cancer which is somewhat suggestive of a hereditary cancer syndrome and predisposition to cancer. We, therefore, discussed and recommended the following at today's visit.   DISCUSSION: We discussed that approximately 10% of breast cancer is hereditary. Most cases of hereditary breast cancer are associated with BRCA1/BRCA2 genes, which also increase risk for pancreatic cancer. There are other genes associated with hereditary cancer as well. Cancers and risks are gene specific. We discussed that testing is beneficial for several reasons including knowing about cancer risks, identifying potential screening and risk-reduction options that may be appropriate, and to understand if other family members could be at risk for cancer and allow them to undergo genetic testing.   We reviewed the characteristics, features and inheritance patterns of hereditary cancer syndromes. We also discussed genetic testing, including the appropriate family members to test, the process of testing, insurance coverage and turn-around-time for results. We discussed the implications of a negative, positive and/or variant of uncertain significant result. We recommended Ms. Seel pursue genetic testing for the [facility] Common Hereditary Cancers+RNA gene panel.   Based on  Ms. Cullen personal and family history of cancer, she meets medical criteria for genetic testing. Despite that she meets criteria, she may still have an out of pocket cost. We discussed that if her out of pocket cost for testing is over $100, the laboratory will call and confirm whether she wants to proceed with testing.  If the out of pocket cost of testing is less than $100 she will be billed by the genetic testing laboratory.   PLAN: After considering the risks, benefits, and limitations, Ms. Dicker provided informed consent to pursue genetic testing and the blood  sample was sent to [facility] for analysis of the Common Hereditary Cancers+RNA panel. Results should be available within approximately 2-3 weeks' time, at which point they will be disclosed by telephone to Ms. Hanline, as will any additional recommendations warranted by these results. Ms. Schewe will receive a summary of her genetic counseling visit and a copy of her results once available. This information will also be available in Epic.   Ms. Vanblarcom questions were answered to her satisfaction today. Our contact information was provided should additional questions or concerns arise. Thank you for the referral and allowing [place] to share in the care of your patient.   Faith Rogue, MS, [facility] Genetic Counselor [email or web address] Phone: [phone]  The patient was seen for a total of 25 minutes in face-to-face genetic counseling.  Dr. Grayland Ormond was available for discussion regarding this case.   _______________________________________________________________________ For Office Staff:  Number of people involved in session: 1 Was an Intern/ student involved with case: no

## 2021-09-18 NOTE — H&P (Signed)
PATIENT PROFILE: Vanessa Fisher is a 61 y.o. female who presents to the Clinic for consultation at the request of Dr. Yu for evaluation of insertion of Port-A-Cath.  PCP: Provider  HISTORY OF PRESENT ILLNESS: Vanessa Fisher reports patient presented to the medical oncology clinic with concern of palpable mass of the left breast. She had complete work-up that shows a large 5 cm mass of the left breast with involvement of the axillary lymph nodes. She had complete work-up for distant metastatic disease without any finding. She had MRI of the brain without any sign of metastasis. She also had PET scan without any sign of distant metastatic disease. She started on chemotherapy and patient decided to proceed with insertion of Port-A-Cath. Today she denies any chest pain, shortness of breath.  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 Anxiety Diabetes mellitus GERD Migraines  GENERAL REVIEW OF SYSTEMS:   General ROS: negative for - chills, fatigue, fever, weight gain or weight loss Allergy and Immunology ROS: negative for - hives  Hematological and Lymphatic ROS: negative for - bleeding problems or bruising, negative for palpable nodes Endocrine ROS: negative for - heat or cold intolerance, hair changes Respiratory ROS: negative for - cough, shortness of breath or wheezing Cardiovascular ROS: no chest pain or palpitations GI ROS: negative for nausea, vomiting, abdominal pain, diarrhea, constipation Musculoskeletal ROS: negative for - joint swelling or muscle pain Neurological ROS: negative for - confusion, syncope Dermatological ROS: negative for pruritus and rash Psychiatric: negative for anxiety, depression, difficulty sleeping and memory loss  MEDICATIONS: Current Outpatient Medications  Medication Sig Dispense Refill  albuterol 90 mcg/actuation inhaler Inhale into the lungs  aspirin 81 MG EC tablet Take 81 mg by mouth once daily  lisinopriL (ZESTRIL)  10 MG tablet Take 10 mg by mouth once daily  pantoprazole (PROTONIX) 40 MG DR tablet Take by mouth  VENTOLIN HFA 90 mcg/actuation inhaler  aspirin-calcium carbonate 81 mg-300 mg calcium(777 mg) Tab Take by mouth (Patient not taking: Reported on 09/18/2021)  fluconazole (DIFLUCAN) 150 MG tablet (Patient not taking: Reported on 09/18/2021)  gabapentin (NEURONTIN) 400 MG capsule Take by mouth (Patient not taking: Reported on 09/18/2021)  predniSONE (DELTASONE) 5 MG tablet Take 6 tabs today, then decrease by one tab daily - 5 tabs on day 2, 4 tabs on day 3, 3 tabs on day 4, 2 tabs on day 5, 1 tab on day 6. (Patient not taking: Reported on 09/18/2021) 21 tablet 0   No current facility-administered medications for this visit.   ALLERGIES: Patient has no known allergies.  PAST MEDICAL HISTORY: Past Medical History:  Diagnosis Date  Asthma  Breast cancer (CMS-HCC) 2023  Dr Yu  Diabetes mellitus without complication (CMS-HCC)  High blood pressure   PAST SURGICAL HISTORY: No past surgical history on file.   FAMILY HISTORY: No family history on file.   SOCIAL HISTORY: Social History   Socioeconomic History  Marital status: Single  Tobacco Use  Smoking status: Former  Smokeless tobacco: Never  Vaping Use  Vaping Use: Never used  Substance and Sexual Activity  Alcohol use: Not Currently   PHYSICAL EXAM: There were no vitals filed for this visit. Body mass index is 34.38 kg/m. Weight: 96.6 kg (213 lb)   GENERAL: Alert, active, oriented x3  HEENT: Pupils equal reactive to light. Extraocular movements are intact. Sclera clear. Palpebral conjunctiva normal red color.Pharynx clear.  NECK: Supple with no palpable mass and no adenopathy.  LUNGS: Sound clear with no   rales rhonchi or wheezes.  HEART: Regular rhythm S1 and S2 without murmur.  ABDOMEN: Soft and depressible, nontender with no palpable mass, no hepatomegaly.   EXTREMITIES: Well-developed well-nourished symmetrical with  no dependent edema.  NEUROLOGICAL: Awake alert oriented, facial expression symmetrical, moving all extremities.  REVIEW OF DATA: I have reviewed the following data today: No visits with results within 3 Month(s) from this visit.  Latest known visit with results is:  Office Visit on 04/01/2018  Component Date Value  Sodium 04/01/2018 139  Potassium 04/01/2018 3.8  Chloride 04/01/2018 107  Carbon Dioxide (CO2) 04/01/2018 25  Urea Nitrogen (BUN) 04/01/2018 10  Creatinine 04/01/2018 0.8  Glucose 04/01/2018 84  Calcium 04/01/2018 9.4  Anion Gap 04/01/2018 7  BUN/CREA Ratio 04/01/2018 13  Glomerular Filtration Ra* 04/01/2018 96  Magnesium 04/01/2018 1.8  Glucose-Whole Blood 04/01/2018 87    ASSESSMENT: Vanessa Fisher is a 61 y.o. female presenting for consultation for insertion of Port-A-Cath.  Patient with clinical stage IIIb (cT4b, cN3c, cM0, G3, ER-, PR-, HER2+) left breast cancer.  Originally she wanted to avoid the Port-A-Cath but after first chemotherapy she has decided to proceed with insertion of Port-A-Cath.  Patient was oriented about the benefits of the Port-A-Cath. She was oriented about the surgical procedure. She was oriented about the risk of the procedure that includes infection, bleeding, pain, deep venous thrombosis, arteriovenous deformation, pneumothorax, hemothorax, among others. The patient reports she understood and agreed to proceed with insertion of Port-A-Cath.  Malignant neoplasm of overlapping sites of left breast in female, estrogen receptor negative (CMS-HCC) [C50.812, Z17.1]  PLAN: 1. Insertion of Port a Cath (36561, [id number], [id number]) 2. CBC, CMP (done) 3. Contact us if has any question or concern.   Patient verbalized understanding, all questions were answered, and were agreeable with the plan outlined above.   Marielena Harvell Cintron-Diaz, MD  

## 2021-09-18 NOTE — H&P (View-Only) (Signed)
PATIENT PROFILE: Vanessa Fisher is a 61 y.o. female who presents to the Clinic for consultation at the request of Dr. Tasia Catchings for evaluation of insertion of Port-A-Cath.  PCP: Provider  HISTORY OF PRESENT ILLNESS: [name] reports patient presented to the medical oncology clinic with concern of palpable mass of the left breast. She had complete work-up that shows a large 5 cm mass of the left breast with involvement of the axillary lymph nodes. She had complete work-up for distant metastatic disease without any finding. She had MRI of the brain without any sign of metastasis. She also had PET scan without any sign of distant metastatic disease. She started on chemotherapy and patient decided to proceed with insertion of Port-A-Cath. Today she denies any chest pain, shortness of breath.  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 Anxiety Diabetes mellitus GERD Migraines  GENERAL REVIEW OF SYSTEMS:   General ROS: negative for - chills, fatigue, fever, weight gain or weight loss Allergy and Immunology ROS: negative for - hives  Hematological and Lymphatic ROS: negative for - bleeding problems or bruising, negative for palpable nodes Endocrine ROS: negative for - heat or cold intolerance, hair changes Respiratory ROS: negative for - cough, shortness of breath or wheezing Cardiovascular ROS: no chest pain or palpitations GI ROS: negative for nausea, vomiting, abdominal pain, diarrhea, constipation Musculoskeletal ROS: negative for - joint swelling or muscle pain Neurological ROS: negative for - confusion, syncope Dermatological ROS: negative for pruritus and rash Psychiatric: negative for anxiety, depression, difficulty sleeping and memory loss  MEDICATIONS: Current Outpatient Medications  Medication Sig Dispense Refill  albuterol 90 mcg/actuation inhaler Inhale into the lungs  aspirin 81 MG EC tablet Take 81 mg by mouth once daily  lisinopriL (ZESTRIL)  10 MG tablet Take 10 mg by mouth once daily  pantoprazole (PROTONIX) 40 MG DR tablet Take by mouth  VENTOLIN HFA 90 mcg/actuation inhaler  aspirin-calcium carbonate 81 mg-300 mg calcium(777 mg) Tab Take by mouth (Patient not taking: Reported on 09/18/2021)  fluconazole (DIFLUCAN) 150 MG tablet (Patient not taking: Reported on 09/18/2021)  gabapentin (NEURONTIN) 400 MG capsule Take by mouth (Patient not taking: Reported on 09/18/2021)  predniSONE (DELTASONE) 5 MG tablet Take 6 tabs today, then decrease by one tab daily - 5 tabs on day 2, 4 tabs on day 3, 3 tabs on day 4, 2 tabs on day 5, 1 tab on day 6. (Patient not taking: Reported on 09/18/2021) 21 tablet 0   No current facility-administered medications for this visit.   ALLERGIES: Patient has no known allergies.  PAST MEDICAL HISTORY: Past Medical History:  Diagnosis Date  Asthma  Breast cancer (CMS-HCC) 2023  Dr Tasia Catchings  Diabetes mellitus without complication (CMS-HCC)  High blood pressure   PAST SURGICAL HISTORY: No past surgical history on file.   FAMILY HISTORY: No family history on file.   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 Body mass index is 34.38 kg/m. Weight: 96.6 kg (213 lb)   GENERAL: Alert, active, oriented x3  HEENT: Pupils equal reactive to light. Extraocular movements are intact. Sclera clear. Palpebral conjunctiva normal red color.Pharynx clear.  NECK: Supple with no palpable mass and no adenopathy.  LUNGS: Sound clear with no  rales rhonchi or wheezes.  HEART: Regular rhythm S1 and S2 without murmur.  ABDOMEN: Soft and depressible, nontender with no palpable mass, no hepatomegaly.   EXTREMITIES: Well-developed well-nourished symmetrical with  no dependent edema.  NEUROLOGICAL: Awake alert oriented, facial expression symmetrical, moving all extremities.  REVIEW OF DATA: I have reviewed the following data today: No visits with results within 3 Month(s) from this visit.  Latest known visit with results is:  Office Visit on 04/01/2018  Component Date Value  Sodium 04/01/2018 139  Potassium 04/01/2018 3.8  Chloride 04/01/2018 107  Carbon Dioxide (CO2) 04/01/2018 25  Urea Nitrogen (BUN) 04/01/2018 10  Creatinine 04/01/2018 0.8  Glucose 04/01/2018 84  Calcium 04/01/2018 9.4  Anion Gap 04/01/2018 7  BUN/CREA Ratio 04/01/2018 13  Glomerular Filtration Ra* 04/01/2018 96  Magnesium 04/01/2018 1.8  Glucose-Whole Blood 04/01/2018 87    ASSESSMENT: Ms. [name] is a 61 y.o. female presenting for consultation for insertion of Port-A-Cath.  Patient with clinical stage IIIb (cT4b, cN3c, cM0, G3, ER-, PR-, HER2+) left breast cancer.  Originally she wanted to avoid the Port-A-Cath but after first chemotherapy she has decided to proceed with insertion of Port-A-Cath.  Patient was oriented about the benefits of the Port-A-Cath. She was oriented about the surgical procedure. She was oriented about the risk of the procedure that includes infection, bleeding, pain, deep venous thrombosis, arteriovenous deformation, pneumothorax, hemothorax, among others. The patient reports she understood and agreed to proceed with insertion of Port-A-Cath.  Malignant neoplasm of overlapping sites of left breast in female, estrogen receptor negative (CMS-HCC) [C50.812, Z17.1]  PLAN: 1. Insertion of Port a Cath [phone], [id number], [id number]) 2. CBC, CMP (done) 3. Contact us if has any question or concern.   Patient verbalized understanding, all questions were answered, and were agreeable with the plan outlined above.   Herbert Pun, MD

## 2021-09-22 ENCOUNTER — Inpatient Hospital Stay (HOSPITAL_BASED_OUTPATIENT_CLINIC_OR_DEPARTMENT_OTHER): Payer: Medicare HMO | Admitting: Hospice and Palliative Medicine

## 2021-09-22 ENCOUNTER — Other Ambulatory Visit: Payer: Self-pay

## 2021-09-22 ENCOUNTER — Encounter: Payer: Self-pay | Admitting: *Deleted

## 2021-09-22 ENCOUNTER — Encounter: Payer: Self-pay | Admitting: Hospice and Palliative Medicine

## 2021-09-22 ENCOUNTER — Telehealth: Payer: Self-pay | Admitting: *Deleted

## 2021-09-22 ENCOUNTER — Other Ambulatory Visit: Payer: Self-pay | Admitting: Oncology

## 2021-09-22 ENCOUNTER — Ambulatory Visit
Admission: RE | Admit: 2021-09-22 | Discharge: 2021-09-22 | Disposition: A | Discharge status: Home / Self Care | Payer: Medicare HMO | Source: Ambulatory Visit | Attending: Hospice and Palliative Medicine | Admitting: Hospice and Palliative Medicine

## 2021-09-22 VITALS — BP 114/72 | HR 75 | Temp 96.3°F | Resp 18 | Ht 66.0 in | Wt 216.0 lb

## 2021-09-22 DIAGNOSIS — T801XXA Vascular complications following infusion, transfusion and therapeutic injection, initial encounter: Secondary | ICD-10-CM

## 2021-09-22 DIAGNOSIS — R110 Nausea: Secondary | ICD-10-CM | POA: Diagnosis not present

## 2021-09-22 DIAGNOSIS — Z5111 Encounter for antineoplastic chemotherapy: Secondary | ICD-10-CM | POA: Diagnosis not present

## 2021-09-22 DIAGNOSIS — I808 Phlebitis and thrombophlebitis of other sites: Secondary | ICD-10-CM | POA: Diagnosis not present

## 2021-09-22 DIAGNOSIS — C50812 Malignant neoplasm of overlapping sites of left female breast: Secondary | ICD-10-CM

## 2021-09-22 DIAGNOSIS — Z5112 Encounter for antineoplastic immunotherapy: Secondary | ICD-10-CM | POA: Diagnosis not present

## 2021-09-22 DIAGNOSIS — I82612 Acute embolism and thrombosis of superficial veins of left upper extremity: Secondary | ICD-10-CM | POA: Diagnosis not present

## 2021-09-22 DIAGNOSIS — I809 Phlebitis and thrombophlebitis of unspecified site: Secondary | ICD-10-CM | POA: Diagnosis not present

## 2021-09-22 DIAGNOSIS — R600 Localized edema: Secondary | ICD-10-CM | POA: Diagnosis not present

## 2021-09-22 DIAGNOSIS — Z171 Estrogen receptor negative status [ER-]: Secondary | ICD-10-CM

## 2021-09-22 DIAGNOSIS — Z5189 Encounter for other specified aftercare: Secondary | ICD-10-CM | POA: Diagnosis not present

## 2021-09-22 MED ORDER — DEXAMETHASONE 4 MG PO TABS
4.0000 mg | ORAL_TABLET | Freq: Two times a day (BID) | ORAL | 0 refills | Status: DC
Start: 2021-09-22 — End: 2021-10-28

## 2021-09-22 MED ORDER — APIXABAN 2.5 MG PO TABS
2.5000 mg | ORAL_TABLET | Freq: Two times a day (BID) | ORAL | 0 refills | Status: DC
Start: 2021-09-24 — End: 2021-09-23

## 2021-09-22 NOTE — Progress Notes (Addendum)
Symptom Management and [place] at [facility] [phone] [phone]  Patient Care Team: [place] as PCP - General Daiva Huge, RN as Oncology Nurse Navigator   NAME OF PATIENT: Vanessa Fisher  [id number]  03-10-1960   DATE OF VISIT: 09/22/21  REASON FOR CONSULT: Vanessa Fisher is a 61 y.o. 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   INTERVAL HISTORY: Patient received cycle 1 TCHP chemotherapy on 09/12/2021.  Patient had pain at the site of her peripheral IV on the left wrist during chemoinfusion.  IV site was rotated to her right arm to complete chemotherapy infusion.  Since infusion, patient reports that she has had redness/swelling/pain to the left wrist despite using frequent ice packs.  She denies any purulence or drainage.  She has noted some blisters that formed over the weekend.  Denies any neurologic complaints. Denies recent fevers or illnesses. Denies any easy bleeding or bruising. Reports good appetite and denies weight loss. Denies chest pain. Denies any nausea, vomiting, constipation, or diarrhea. Denies urinary complaints. Patient offers no further specific complaints today.  SOCIAL HISTORY:     reports that she has never smoked. She has never used smokeless tobacco. She reports that she does not drink alcohol and does not use drugs.  Patient is unmarried.  She lives at home alone.  Her oldest son died of cancer.  She has a son and daughter who live nearby.  She also has a sister who is involved.  Patient previously worked at [employer].  ADVANCE DIRECTIVES:  Does not have  CODE STATUS:   PAST MEDICAL HISTORY: Past Medical History:  Diagnosis Date   Anemia    Anxiety    Arthritis    Asthma    WELL CONTROLLED   Bile acid malabsorption syndrome    Bradycardia    HAD AN ISSUE WITH THIS IN 2016-NO PROBLEMS  SINCE   Depression    Diabetes mellitus without complication (HCC)    Diverticulitis    Gastric reflux    GERD (gastroesophageal reflux disease)    Hand pain 05/12/2016   Headache    H/O MIGRAINES   History of kidney stones    H/O   Neck pain, chronic    Panic attacks     PAST SURGICAL HISTORY:  Past Surgical History:  Procedure Laterality Date   BREAST BIOPSY Left 08/21/2021   Axilla Bx, Hydromarker, path pending   BREAST BIOPSY Left 08/21/2021   [place] Bx, Ribbon Clip, Path Pending   CARPAL TUNNEL RELEASE Right 12/06/2017   Procedure: CARPAL TUNNEL RELEASE;  Surgeon: Earnestine Leys, MD;  Location: [facility] ORS;  Service: Orthopedics;  Laterality: Right;   COLONOSCOPY WITH PROPOFOL N/A 06/11/2021   Procedure: COLONOSCOPY WITH PROPOFOL;  Surgeon: Lin Landsman, MD;  Location: [facility] ENDOSCOPY;  Service: Gastroenterology;  Laterality: N/A;   DORSAL COMPARTMENT RELEASE Right 12/06/2017   Procedure: RELEASE DORSAL COMPARTMENT (DEQUERVAIN);  Surgeon: Earnestine Leys, MD;  Location: [facility] ORS;  Service: Orthopedics;  Laterality: Right;   ESOPHAGOGASTRODUODENOSCOPY (EGD) WITH PROPOFOL N/A 06/11/2021   Procedure: ESOPHAGOGASTRODUODENOSCOPY (EGD) WITH PROPOFOL;  Surgeon: Lin Landsman, MD;  Location: [facility] ENDOSCOPY;  Service: Gastroenterology;  Laterality: N/A;   PARTIAL HYSTERECTOMY     TUBAL LIGATION      HEMATOLOGY/ONCOLOGY HISTORY:  Oncology History  Breast cancer ([place])  07/31/2021 Imaging   Bilateral diagnostic mammogram and US showed 5 centimeter LEFT breast mass  associated with pleomorphic calcifications is suspicious for invasive ductal carcinoma.At least 4 LEFT axillary lymph nodes with abnormal morphology   08/21/2021 Cancer Staging   Staging form: Breast, AJCC 8th Edition - Clinical: Stage IIIB (cT4b, cN3c, cM0, G3, ER-, PR-, HER2+) - Signed by Earlie Server, MD on 08/28/2021 Histologic grading system: 3 grade system  -08/21/21 left breast ultrasound-guided biopsy showed  invasive mammary carcinoma, grade 3, ER/PR negative, HER2 positive.  Left axillary lymph node biopsy positive for macro metastatic mammary carcinoma, 8 mm in greatest extent.   08/30/2021 Imaging   MRI brain w wo contrast -No metastatic disease or acute intracranial abnormality. Essentially normal for age MRI appearance of the brain.    09/03/2021 Echocardiogram   1. Left ventricular ejection fraction, by estimation, is 55 to 60%. Left ventricular ejection fraction by 3D volume is 58 %. The left ventricle has normal function. The left ventricle has no regional wall motion abnormalities. There is mild left  ventricular hypertrophy. Left ventricular diastolic parameters were normal.  2. Right ventricular systolic function is normal. The right ventricular size is normal.  3. The mitral valve is normal in structure. No evidence of mitral valve regurgitation.  4. The aortic valve was not well visualized. Aortic valve regurgitation is not visualized   09/10/2021 Imaging   PET scan showed Large hypermetabolic left breast mass and diffuse skin thickening,consistent with primary breast carcinoma. Hypermetabolic lymphadenopathy in the left axilla, left subpectoral region, and left supraclavicular region, consistent with metastatic disease. No evidence of metastatic disease within the abdomen or pelvis   09/12/2021 -  Chemotherapy   Patient is on Treatment Plan : BREAST  Docetaxel + Carboplatin + Trastuzumab + Pertuzumab  (TCHP) q21d        ALLERGIES:  is allergic to bc powder [aspirin-salicylamide-caffeine], gabapentin, hydrocodone-acetaminophen, latex, penicillin g, penicillins, and shrimp [shellfish allergy].  MEDICATIONS:  Current Outpatient Medications  Medication Sig Dispense Refill   albuterol (PROVENTIL) (2.5 MG/3ML) 0.083% nebulizer solution Take 3 mLs (2.5 mg total) by nebulization every 4 (four) hours as needed for wheezing or shortness of breath. 75 mL 2   albuterol (VENTOLIN HFA) 108 (90  Base) MCG/ACT inhaler Inhale 2 puffs into the lungs every 6 (six) hours as needed for wheezing or shortness of breath. 18 g 0   aspirin 81 MG chewable tablet Chew 81 mg by mouth daily.     dexamethasone (DECADRON) 4 MG tablet Take 2 tablets (8 mg total) by mouth daily. Start the day before Taxotere. Then take daily x 2 days after chemotherapy. 30 tablet 1   dexamethasone (DECADRON) 4 MG tablet Take 1 tablet (4 mg total) by mouth 2 (two) times daily. 10 tablet 0   FLOVENT DISKUS 250 MCG/ACT AEPB Inhale into the lungs.     fluticasone (FLONASE) 50 MCG/ACT nasal spray Place 2 sprays into both nostrils daily. 15.8 mL 0   lidocaine-prilocaine (EMLA) cream Apply 1 Application topically as needed. 30 g 12   lisinopril-hydrochlorothiazide (ZESTORETIC) 10-12.5 MG tablet Take 1 tablet by mouth daily.     loperamide (IMODIUM) 2 MG capsule Take 2 mg by mouth as needed for diarrhea or loose stools.     loratadine (CLARITIN) 10 MG tablet Take 1 tablet (10 mg total) by mouth daily. 30 tablet 2   magic mouthwash w/lidocaine SOLN Take 5 mLs by mouth 4 (four) times daily as needed for mouth pain. Sig: Swish/Swallow 5-10 ml four times a day as needed. Dispense 480 ml. 1RF 480 mL 1  montelukast (SINGULAIR) 10 MG tablet Take by mouth.     naloxone (NARCAN) nasal spray 4 mg/0.1 mL      ondansetron (ZOFRAN) 8 MG tablet Take 1 tablet (8 mg total) by mouth 2 (two) times daily as needed (Nausea or vomiting). Start on the third day after chemotherapy. 30 tablet 1   oxyCODONE-acetaminophen (PERCOCET) 10-325 MG tablet Take 1 tablet by mouth 3 (three) times daily as needed.     pantoprazole (PROTONIX) 40 MG tablet Take 40 mg by mouth daily.     prochlorperazine (COMPAZINE) 10 MG tablet Take 1 tablet (10 mg total) by mouth every 6 (six) hours as needed (Nausea or vomiting). 30 tablet 1   TRULICITY 3 LN/9.8XQ SOPN SMARTSIG:3 Milligram(s) SUB-Q Once a Week     No current facility-administered medications for this visit.     VITAL SIGNS: BP 114/72   Pulse 75   Temp (!) 96.3 F (35.7 C) (Tympanic)   Resp 18   Ht _0  (1.676 m)   Wt 216 lb (98 kg)   BMI 34.86 kg/m  Filed Weights   09/22/21 1121  Weight: 216 lb (98 kg)    Estimated body mass index is 34.86 kg/m as calculated from the following:   Height as of this encounter: _1  (1.676 m).   Weight as of this encounter: 216 lb (98 kg).  LABS: CBC:    Component Value Date/Time   WBC 14.9 (H) 09/16/2021 1253   HGB 11.6 (L) 09/16/2021 1253   HGB 11.9 (L) 05/27/2014 2215   HCT 36.9 09/16/2021 1253   HCT 36.4 05/27/2014 2215   PLT 219 09/16/2021 1253   PLT 293 05/27/2014 2215   MCV 83.5 09/16/2021 1253   MCV 81 05/27/2014 2215   NEUTROABS 12.1 (H) 09/16/2021 1253   NEUTROABS 5.4 05/27/2014 2215   LYMPHSABS 1.6 09/16/2021 1253   LYMPHSABS 3.0 05/27/2014 2215   MONOABS 0.1 09/16/2021 1253   MONOABS 0.6 05/27/2014 2215   EOSABS 0.1 09/16/2021 1253   EOSABS 0.0 05/27/2014 2215   BASOSABS 0.1 09/16/2021 1253   BASOSABS 0.0 05/27/2014 2215   Comprehensive Metabolic Panel:    Component Value Date/Time   NA 138 09/16/2021 1253   NA 143 07/08/2016 1259   NA 139 05/27/2014 2215   K 3.5 09/16/2021 1253   K 3.5 05/27/2014 2215   CL 103 09/16/2021 1253   CL 105 05/27/2014 2215   CO2 29 09/16/2021 1253   CO2 26 05/27/2014 2215   BUN 11 09/16/2021 1253   BUN 10 07/08/2016 1259   BUN 14 05/27/2014 2215   CREATININE 0.74 09/16/2021 1253   CREATININE 1.09 (H) 05/27/2014 2215   GLUCOSE 105 (H) 09/16/2021 1253   GLUCOSE 104 (H) 05/27/2014 2215   CALCIUM 8.3 (L) 09/16/2021 1253   CALCIUM 9.0 05/27/2014 2215   AST 24 09/16/2021 1253   AST < 5 (L) 08/05/2013 0832   ALT 26 09/16/2021 1253   ALT 24 08/05/2013 0832   ALKPHOS 87 09/16/2021 1253   ALKPHOS 97 08/05/2013 0832   BILITOT 0.5 09/16/2021 1253   BILITOT 0.4 07/08/2016 1259   BILITOT 0.4 08/05/2013 0832   PROT 6.9 09/16/2021 1253   PROT 7.0 07/08/2016 1259   PROT 6.8 08/05/2013  0832   ALBUMIN 3.5 09/16/2021 1253   ALBUMIN 4.0 07/08/2016 1259   ALBUMIN 3.4 08/05/2013 0832    RADIOGRAPHIC STUDIES: NM PET Image Initial (PI) Skull Base To Thigh  Result Date: 09/10/2021 CLINICAL DATA:  Initial  treatment strategy for left breast carcinoma. EXAM: NUCLEAR MEDICINE PET SKULL BASE TO THIGH TECHNIQUE: 12.0 mCi F-18 FDG was injected intravenously. Full-ring PET imaging was performed from the skull base to thigh after the radiotracer. CT data was obtained and used for attenuation correction and anatomic localization. Fasting blood glucose: 101 mg/dl COMPARISON:  AP CT on 04/17/2021 FINDINGS: Mediastinal blood-pool activity (background): SUV max = 2.0 Liver activity (reference): SUV max = N/A NECK:  No hypermetabolic lymph nodes or masses. Incidental CT findings:  None. CHEST: Large multilobular mass and diffuse skin thickening is seen in the left breast. This has SUV max 14.9 consistent primary breast carcinoma. Hypermetabolic lymphadenopathy is seen in the left axilla measuring up to 3.1 x 2.5 cm with SUV max of 12.2. A few sub-cm left subpectoral lymph nodes are also seen with SUV max of 2.8. A few sub-cm left supraclavicular lymph nodes are also seen which has a SUV max of 3.3. No hypermetabolic mediastinal or hilar lymph nodes identified. No suspicious pulmonary nodules or masses seen on CT images. No evidence of pulmonary infiltrate or pleural effusion. Incidental CT findings:  None. ABDOMEN/PELVIS: No abnormal hypermetabolic activity within the liver, pancreas, adrenal glands, or spleen. No hypermetabolic lymph nodes in the abdomen or pelvis. Incidental CT findings:  None. SKELETON: No focal hypermetabolic bone lesions to suggest skeletal metastasis. Incidental CT findings:  None. IMPRESSION: Large hypermetabolic left breast mass and diffuse skin thickening, consistent with primary breast carcinoma. Hypermetabolic lymphadenopathy in the left axilla, left subpectoral region, and left  supraclavicular region, consistent with metastatic disease. No evidence of metastatic disease within the abdomen or pelvis. Electronically Signed   By: Marlaine Hind M.D.   On: 09/10/2021 14:41   ECHOCARDIOGRAM COMPLETE  Result Date: 09/03/2021    ECHOCARDIOGRAM REPORT   Patient Name:   YAMARI VENTOLA Date of Exam: 09/03/2021 Medical Rec #:  [id number]            Height:       66.0 in Accession #:    [id number]           Weight:       216.0 lb Date of Birth:  Jul 17, 1960            BSA:          2.066 m Patient Age:    84 years             BP:           132/69 mmHg Patient Gender: F                    HR:           57 bpm. Exam Location:  [facility] Procedure: 2D Echo, Cardiac Doppler, Color Doppler and Strain Analysis Indications:     Chemo Z09  History:         Patient has no prior history of Echocardiogram examinations.                  Risk Factors:Diabetes. Bradycardia.  Sonographer:     Sherrie Sport Referring Phys:  [id number] ZHOU YU Diagnosing Phys: Kate Sable MD  Sonographer Comments: Suboptimal apical window. Global longitudinal strain was attempted. IMPRESSIONS  1. Left ventricular ejection fraction, by estimation, is 55 to 60%. Left ventricular ejection fraction by 3D volume is 58 %. The left ventricle has normal function. The left ventricle has no regional wall motion abnormalities. There is mild left ventricular hypertrophy. Left ventricular diastolic  parameters were normal.  2. Right ventricular systolic function is normal. The right ventricular size is normal.  3. The mitral valve is normal in structure. No evidence of mitral valve regurgitation.  4. The aortic valve was not well visualized. Aortic valve regurgitation is not visualized. FINDINGS  Left Ventricle: Left ventricular ejection fraction, by estimation, is 55 to 60%. Left ventricular ejection fraction by 3D volume is 58 %. The left ventricle has normal function. The left ventricle has no regional wall motion abnormalities. Global  longitudinal strain performed but not reported based on interpreter judgement due to suboptimal tracking. The left ventricular internal cavity size was normal in size. There is mild left ventricular hypertrophy. Left ventricular diastolic parameters were  normal. Right Ventricle: The right ventricular size is normal. No increase in right ventricular wall thickness. Right ventricular systolic function is normal. Left Atrium: Left atrial size was normal in size. Right Atrium: Right atrial size was normal in size. Pericardium: There is no evidence of pericardial effusion. Mitral Valve: The mitral valve is normal in structure. No evidence of mitral valve regurgitation. Tricuspid Valve: The tricuspid valve is normal in structure. Tricuspid valve regurgitation is trivial. Aortic Valve: The aortic valve was not well visualized. Aortic valve regurgitation is not visualized. Aortic valve mean gradient measures 4.0 mmHg. Aortic valve peak gradient measures 6.4 mmHg. Aortic valve area, by VTI measures 2.42 cm. Pulmonic Valve: The pulmonic valve was not well visualized. Pulmonic valve regurgitation is not visualized. Aorta: The aortic root is normal in size and structure. Venous: The inferior vena cava was not well visualized. IAS/Shunts: No atrial level shunt detected by color flow Doppler.  LEFT VENTRICLE PLAX 2D LVIDd:         3.70 cm         Diastology LVIDs:         2.50 cm         LV e' medial:    7.94 cm/s LV PW:         1.20 cm         LV E/e' medial:  11.6 LV IVS:        1.10 cm         LV e' lateral:   10.20 cm/s LVOT diam:     2.00 cm         LV E/e' lateral: 9.1 LV SV:         70 LV SV Index:   34 LVOT Area:     3.14 cm        3D Volume EF                                LV 3D EF:    Left                                             ventricul                                             ar  ejection                                             fraction                                              by 3D                                             volume is                                             58 %.                                 3D Volume EF:                                3D EF:        58 %                                LV EDV:       243 ml                                LV ESV:       103 ml                                LV SV:        140 ml RIGHT VENTRICLE RV Basal diam:  3.70 cm RV S prime:     11.90 cm/s TAPSE (M-mode): 2.2 cm LEFT ATRIUM             Index        RIGHT ATRIUM           Index LA diam:        3.10 cm 1.50 cm/m   RA Area:     12.80 cm LA Vol (A2C):   60.0 ml 29.04 ml/m  RA Volume:   31.30 ml  15.15 ml/m LA Vol (A4C):   31.6 ml 15.29 ml/m LA Biplane Vol: 44.5 ml 21.54 ml/m  AORTIC VALVE AV Area (Vmax):    2.54 cm AV Area (Vmean):   2.12 cm AV Area (VTI):     2.42 cm AV Vmax:           126.00 cm/s AV Vmean:          96.100 cm/s AV VTI:            0.288 m AV Peak Grad:      6.4 mmHg AV Mean Grad:      4.0 mmHg LVOT Vmax:         102.00  cm/s LVOT Vmean:        64.800 cm/s LVOT VTI:          0.222 m LVOT/AV VTI ratio: 0.77  AORTA Ao Root diam: 2.53 cm MITRAL VALVE               TRICUSPID VALVE MV Area (PHT): 4.46 cm    TR Peak grad:   16.3 mmHg MV Decel Time: 170 msec    TR Vmax:        202.00 cm/s MV E velocity: 92.50 cm/s MV A velocity: 60.00 cm/s  SHUNTS MV E/A ratio:  1.54        Systemic VTI:  0.22 m                            Systemic Diam: 2.00 cm Kate Sable MD Electronically signed by Kate Sable MD Signature Date/Time: 09/03/2021/3:18:34 PM    Final    MR Brain W Wo Contrast  Result Date: 08/30/2021 CLINICAL DATA:  61 year old female with head CT 04/01/2013. Cervical spine MRI 11/09/2012. Breast cancer diagnosed last month. Staging. EXAM: MRI HEAD WITHOUT AND WITH CONTRAST TECHNIQUE: Multiplanar, multiecho pulse sequences of the brain and surrounding structures were obtained without and with intravenous contrast. CONTRAST:  51m  GADAVIST GADOBUTROL 1 MMOL/ML IV SOLN COMPARISON:  None Available. FINDINGS: Brain: Normal cerebral volume. No midline shift, mass effect, or evidence of intracranial mass lesion. No abnormal enhancement identified. No dural thickening identified. Partially empty sella. No restricted diffusion to suggest acute infarction. No ventriculomegaly, extra-axial collection or acute intracranial hemorrhage. Cervicomedullary junction is within normal limits. Essentially normal for age gray and white matter signal throughout the brain; there is a solitary nonspecific, nonenhancing small left centrum semiovale white matter T2 and FLAIR hyperintensity on series 15, image 35. No cortical encephalomalacia or chronic cerebral blood products identified. Vascular: Major intracranial vascular flow voids are preserved. The major dural venous sinuses are enhancing and appear to be patent. Skull and upper cervical spine: Visualized bone marrow signal is within normal limits. Negative visible cervical spine. Sinuses/Orbits: Negative; trace paranasal sinus mucosal thickening. Other: Mastoids are clear. Visible internal auditory structures appear normal. Negative visible scalp and face. IMPRESSION: No metastatic disease or acute intracranial abnormality. Essentially normal for age MRI appearance of the brain. Electronically Signed   By: HGenevie AnnM.D.   On: 08/30/2021 11:44    PERFORMANCE STATUS (ECOG) : 1 - Symptomatic but completely ambulatory  Review of Systems Unless otherwise noted, a complete review of systems is negative.  Physical Exam General: NAD Cardiovascular: regular rate and rhythm Pulmonary: clear ant fields Abdomen: soft, nontender, + bowel sounds GU: no suprapubic tenderness Extremities: no edema, no joint deformities Skin: no rashes Neurological: Weakness but otherwise nonfocal  IMPRESSION: I spoke with nursing team.  Reportedly, peripheral IV had good blood return prior to rotating sites.  Site was rotated  due to localized discomfort.  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  Case discussed with Dr. YTasia Catchings  We will start patient on systemic steroids and send for Doppler study to rule out DVT/SVT.  Continue cold compresses and symptomatic treatment.  Patient to call in if she notes any purulence and would consider antibiotics at that time.  We will also consider referral to dermatology if rash worsens.  PLAN: -Continue current scope of treatment -Doppler left upper extremity -Dexamethasone 4 mg twice daily x5 days -Cool compresses -RTC  1 week  Addendum: Doppler showed superficial thrombophlebitis cephalic vein.  Discussed with Dr. Marquita Palms start patient on Eliquis 2.5 mg twice daily x2 weeks.  Patient called and given instructions for initiation of anticoagulation.  She understands bleeding risk.  Discussed fall precautions.  Patient instructed to hold starting anticoagulation until after her port is placed on Wednesday.  Patient will start Eliquis on Thursday.  Case and plan discussed with Dr. Tasia Catchings  Patient expressed understanding and was in agreement with this plan. She also understands that She can call clinic at any time with any questions, concerns, or complaints.   Thank you for allowing me to participate in the care of this very pleasant patient.   Time Total: 15 minutes  Visit consisted of counseling and education dealing with the complex and emotionally intense issues of symptom management in the setting of serious illness.Greater than 50%  of this time was spent counseling and coordinating care related to the above assessment and plan.  Signed by: Altha Harm, PhD, NP-C

## 2021-09-22 NOTE — Telephone Encounter (Signed)
I reached out to patient per request of [place], [place] navigator. Pt reports that her left hand phlebitis is worse and painful. Pt requested to be seen. She has placed ice packs on her arm, but she stated that she has had minimal relief. Apt given for 1050 to see josh, nP

## 2021-09-22 NOTE — Progress Notes (Signed)
Vanessa Fisher called this morning with blisters, redness, and irritation to her left hand.   Message sent to [facility] and she will be seen this morning.   She also had questions about seeing social work, I spoke to [place] and she doesn't have any availability today but did leave her some applications and a note at the front desk for when she comes in today.

## 2021-09-23 ENCOUNTER — Encounter
Admission: RE | Admit: 2021-09-23 | Discharge: 2021-09-23 | Disposition: A | Discharge status: Home / Self Care | Payer: Medicare HMO | Source: Ambulatory Visit | Attending: General Surgery | Admitting: General Surgery

## 2021-09-23 ENCOUNTER — Inpatient Hospital Stay: Payer: Medicare HMO | Admitting: Hospice and Palliative Medicine

## 2021-09-23 ENCOUNTER — Inpatient Hospital Stay: Payer: Medicare HMO | Admitting: Licensed Clinical Social Worker

## 2021-09-23 VITALS — Ht 66.0 in | Wt 215.0 lb

## 2021-09-23 DIAGNOSIS — Z01812 Encounter for preprocedural laboratory examination: Secondary | ICD-10-CM

## 2021-09-23 DIAGNOSIS — C50812 Malignant neoplasm of overlapping sites of left female breast: Secondary | ICD-10-CM

## 2021-09-23 DIAGNOSIS — E119 Type 2 diabetes mellitus without complications: Secondary | ICD-10-CM

## 2021-09-23 HISTORY — DX: Bilateral primary osteoarthritis of knee: M17.0

## 2021-09-23 HISTORY — DX: Carpal tunnel syndrome, right upper limb: G56.01

## 2021-09-23 HISTORY — DX: Radiculopathy, lumbar region: M54.16

## 2021-09-23 NOTE — Patient Instructions (Addendum)
Your procedure is scheduled on: Wednesday, August 23 Report to the Registration Desk on the 1st floor of the [facility]. To find out your arrival time, please call [phone] between 1PM - 3PM on: Tuesday, August 22 If your arrival time is 6:00 am, do not arrive prior to that time as the [place] entrance doors do not open until 6:00 am.  REMEMBER: Instructions that are not followed completely may result in serious medical risk, up to and including death; or upon the discretion of your surgeon and anesthesiologist your surgery may need to be rescheduled.  Do not eat or drink after midnight the night before surgery.  No gum chewing, lozengers or hard candies.  TAKE THESE MEDICATIONS THE MORNING OF SURGERY WITH A SIP OF WATER:  Albuterol nebulizer Pantoprazole - (take one the night before and one on the morning of surgery - helps to prevent nausea after surgery.)  Use inhalers on the day of surgery and bring to the hospital.  Trulicity - hold 7 days prior to surgery.   One week prior to surgery: Stop aspirin and Anti-inflammatories (NSAIDS) such as Advil, Aleve, Ibuprofen, Motrin, Naproxen, Naprosyn and Aspirin based products such as Excedrin, Goodys Powder, BC Powder. Stop ANY OVER THE COUNTER supplements until after surgery. You may however, continue to take Tylenol if needed for pain up until the day of surgery.  No Alcohol for 24 hours before or after surgery.  No Smoking including e-cigarettes for 24 hours prior to surgery.  No chewable tobacco products for at least 6 hours prior to surgery.  No nicotine patches on the day of surgery.  Do not use any "recreational" drugs for at least a week prior to your surgery.  Please be advised that the combination of cocaine and anesthesia may have negative outcomes, up to and including death. If you test positive for cocaine, your surgery will be cancelled.  On the morning of surgery brush your teeth with toothpaste and water,  you may rinse your mouth with mouthwash if you wish. Do not swallow any toothpaste or mouthwash.  Use CHG Soap as directed on instruction sheet.  Do not wear jewelry, make-up, hairpins, clips or nail polish.  Do not wear lotions, powders, or perfumes.   Do not shave body from the neck down 48 hours prior to surgery just in case you cut yourself which could leave a site for infection.  Also, freshly shaved skin may become irritated if using the CHG soap.  Contact lenses, hearing aids and dentures may not be worn into surgery.  Do not bring valuables to the hospital. [facility] is not responsible for any missing/lost belongings or valuables.   Notify your doctor if there is any change in your medical condition (cold, fever, infection).  Wear comfortable clothing (specific to your surgery type) to the hospital.  After surgery, you can help prevent lung complications by doing breathing exercises.  Take deep breaths and cough every 1-2 hours. Your doctor may order a device called an Incentive Spirometer to help you take deep breaths.  If you are being discharged the day of surgery, you will not be allowed to drive home. You will need a responsible adult (18 years or older) to drive you home and stay with you that night.   If you are taking public transportation, you will need to have a responsible adult (18 years or older) with you. Please confirm with your physician that it is acceptable to use public transportation.  Please call the [facility] Dept. at [phone] if you have any questions about these instructions.  Surgery Visitation Policy:  Patients undergoing a surgery or procedure may have two family members or support persons with them as long as the person is not COVID-19 positive or experiencing its symptoms.   Preparing for Surgery with [place] (CHG) Soap    Before surgery, you can play an important role by reducing the number of germs on  your skin.  CHG (Chlorhexidine gluconate) soap is an antiseptic cleanser which kills germs and bonds with the skin to continue killing germs even after washing.  Please do not use if you have an allergy to CHG or antibacterial soaps. If your skin becomes reddened/irritated stop using the CHG.  1. Shower the NIGHT BEFORE SURGERY and the MORNING OF SURGERY with CHG soap.  2. If you choose to wash your hair, wash your hair first as usual with your normal shampoo.  3. After shampooing, rinse your hair and body thoroughly to remove the shampoo.  4. Use CHG as you would any other liquid soap. You can apply CHG directly to the skin and wash gently with a scrungie or a clean washcloth.  5. Apply the CHG soap to your body only from the neck down. Do not use on open wounds or open sores. Avoid contact with your eyes, ears, mouth, and genitals (private parts). Wash face and genitals (private parts) with your normal soap.  6. Wash thoroughly, paying special attention to the area where your surgery will be performed.  7. Thoroughly rinse your body with warm water.  8. Do not shower/wash with your normal soap after using and rinsing off the CHG soap.  9. Pat yourself dry with a clean towel.  10. Wear clean pajamas to bed the night before surgery.  12. Place clean sheets on your bed the night of your first shower and do not sleep with pets.  13. Shower again with the CHG soap on the day of surgery prior to arriving at the hospital.  14. Do not apply any deodorants/lotions/powders.  15. Please wear clean clothes to the hospital.

## 2021-09-23 NOTE — Progress Notes (Addendum)
  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 Herbert Pun, MD.  Preparation for procedure, patient was scheduled for a preoperative interview with PAT RN today (09/23/2021).  During the course of the interview, it was discovered that patient took GLP-1 (dulaglutide) dose yesterday.  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    Reached out to the cancer center to obtained further information. Spoke with Frederico Hamman, RN who confirmed that patient has already started treatment via peripheral infusion. D1C2 is scheduled for 10/08/2021.  Reached out to attending anesthesiologist on call Wynetta Emery MD) to discuss case.  MD made aware of procedure, the fact that patient took GLP-1 dose yesterday, and the fact that her next antineoplastic chemotherapy treatment is not until 10/08/2021.  I discussed whether or not MAC anesthetic course will be appropriate for patient's procedure.  Per Dr. Wynetta Emery, patient still may require endotracheal intubation based on provider preference.    Impression and Plan:  Vanessa Fisher is scheduled for port placement tomorrow with Dr. Windell Moment, MD.  Given the fact that patient took GLP-1 medication dose yesterday (09/22/2021), anesthesia is asking that  patient's case to be rescheduled to allow for appropriate washout period.  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 Of note, patient's next antineoplastic chemotherapy treatment is not until 10/08/2021, therefore delaying port placement is not preventing patient from proceeding with definitive treatment for her malignancy.    Communicated with Dr. Darrol Poke office to make them aware of recommendations from anesthesia regarding procedure being rescheduled. Will also send copy of this note to Dr. Earlie Server (oncologist), Billey Chang, NP-C (palliative care), and Laurance Flatten, RN (oncology nurse navigator) to make them aware of the change in plan of care for this patient.   Patient will also need a pre-operative ECG for anesthesia clearance, as the last one that she had was in 2021. PAT staff has scheduled the patient to have this done. She has been advised that her procedure will need to be postponed. She was instructed to contact surgeon's office to discuss an alternative date for her procedure.    [name], MSN, APRN, FNP-C, CEN [facility]  Peri-operative Services Nurse Practitioner Phone: [phone] 09/23/21 11:45 AM  NOTE: This note has been prepared using Dragon dictation software. Despite my best ability to proofread, there is always the potential that unintentional transcriptional errors may still occur from this process.  CC'd providers: Dr. Herbert Pun, MD (General Surgeon) Dr. Earlie Server, MD (Medical Oncologist) Dr. Altha Harm, PhD, NP-C (Palliative Care) Casper Harrison, RN (Oncology Nurse Navigator)

## 2021-09-23 NOTE — Progress Notes (Signed)
[facility] CSW Progress Note  Clinical Education officer, museum contacted patient by phone to discuss financial assistance.  CSW went over the different financial resources available and their limitations.  CSW and patient discussed the two paper applications she took with her yesterday and the information and paperwork she would need to include for the applications. CSW and patient discussed scheduling meeting when she was ready to complete applications.  Patient stated she would not be able to meet next week and will possibly be able to meet, the following week. Patient stated she would ask her daughter to assist with the application process and contact CSW to schedule meeting.    Adelene Amas, LCSW    Patient is participating in a Managed Medicaid Plan:  Yes

## 2021-09-23 NOTE — Pre-Procedure Instructions (Signed)
Pre-admission testing phone interview today for surgery tomorrow. Patient stated that she was not told by anyone to stop taking the Trulicity 7 days prior to surgery. Patient has taken it yesterday (Monday). [name], NP (pre-admission testing) notified and is contacting the anesthesia doctor on call (Dr. Wynetta Emery) to see if surgery can be done without general anesthesia or may have to be post-poned.

## 2021-09-24 ENCOUNTER — Ambulatory Visit: Payer: Medicare HMO

## 2021-09-24 ENCOUNTER — Other Ambulatory Visit: Payer: Self-pay

## 2021-09-24 ENCOUNTER — Other Ambulatory Visit: Payer: Self-pay | Admitting: Oncology

## 2021-09-24 ENCOUNTER — Ambulatory Visit: Payer: Medicare HMO | Admitting: Urgent Care

## 2021-09-24 ENCOUNTER — Encounter: Payer: Self-pay | Admitting: General Surgery

## 2021-09-24 ENCOUNTER — Encounter: Payer: Self-pay | Admitting: *Deleted

## 2021-09-24 ENCOUNTER — Encounter
Admission: RE | Disposition: A | Discharge status: Home / Self Care | Payer: Self-pay | Source: Home / Self Care | Attending: General Surgery

## 2021-09-24 ENCOUNTER — Ambulatory Visit
Admission: RE | Admit: 2021-09-24 | Discharge: 2021-09-24 | Disposition: A | Discharge status: Home / Self Care | Payer: Medicare HMO | Attending: General Surgery | Admitting: General Surgery

## 2021-09-24 ENCOUNTER — Encounter: Payer: Self-pay | Admitting: Oncology

## 2021-09-24 DIAGNOSIS — K219 Gastro-esophageal reflux disease without esophagitis: Secondary | ICD-10-CM | POA: Diagnosis not present

## 2021-09-24 DIAGNOSIS — Z171 Estrogen receptor negative status [ER-]: Secondary | ICD-10-CM | POA: Diagnosis not present

## 2021-09-24 DIAGNOSIS — C773 Secondary and unspecified malignant neoplasm of axilla and upper limb lymph nodes: Secondary | ICD-10-CM | POA: Insufficient documentation

## 2021-09-24 DIAGNOSIS — C50812 Malignant neoplasm of overlapping sites of left female breast: Secondary | ICD-10-CM | POA: Insufficient documentation

## 2021-09-24 DIAGNOSIS — Z6834 Body mass index (BMI) 34.0-34.9, adult: Secondary | ICD-10-CM | POA: Diagnosis not present

## 2021-09-24 DIAGNOSIS — E669 Obesity, unspecified: Secondary | ICD-10-CM | POA: Insufficient documentation

## 2021-09-24 DIAGNOSIS — I10 Essential (primary) hypertension: Secondary | ICD-10-CM | POA: Diagnosis not present

## 2021-09-24 DIAGNOSIS — E119 Type 2 diabetes mellitus without complications: Secondary | ICD-10-CM | POA: Insufficient documentation

## 2021-09-24 DIAGNOSIS — Z452 Encounter for adjustment and management of vascular access device: Secondary | ICD-10-CM | POA: Diagnosis not present

## 2021-09-24 DIAGNOSIS — Z01818 Encounter for other preprocedural examination: Secondary | ICD-10-CM | POA: Diagnosis not present

## 2021-09-24 DIAGNOSIS — E1165 Type 2 diabetes mellitus with hyperglycemia: Secondary | ICD-10-CM | POA: Diagnosis not present

## 2021-09-24 DIAGNOSIS — J45909 Unspecified asthma, uncomplicated: Secondary | ICD-10-CM | POA: Diagnosis not present

## 2021-09-24 DIAGNOSIS — Z01812 Encounter for preprocedural laboratory examination: Secondary | ICD-10-CM

## 2021-09-24 HISTORY — PX: PORTACATH PLACEMENT: SHX2246

## 2021-09-24 LAB — GLUCOSE, CAPILLARY
Glucose-Capillary: 101 mg/dL — ABNORMAL HIGH (ref 70–99)
Glucose-Capillary: 103 mg/dL — ABNORMAL HIGH (ref 70–99)

## 2021-09-24 SURGERY — INSERTION, TUNNELED CENTRAL VENOUS DEVICE, WITH PORT
Anesthesia: General | Site: Chest

## 2021-09-24 MED ORDER — PROPOFOL 10 MG/ML IV BOLUS
INTRAVENOUS | Status: AC
  Filled 2021-09-24: qty 20

## 2021-09-24 MED ORDER — MAGIC MOUTHWASH W/LIDOCAINE: 5.0000 mL | Freq: Four times a day (QID) | ORAL | 1 refills | Status: DC | PRN

## 2021-09-24 MED ORDER — ORAL CARE MOUTH RINSE: 15.0000 mL | Freq: Once | OROMUCOSAL | Status: AC

## 2021-09-24 MED ORDER — 0.9 % SODIUM CHLORIDE (POUR BTL) OPTIME
TOPICAL | Status: DC | PRN
  Administered 2021-09-24: 100 mL

## 2021-09-24 MED ORDER — PROPOFOL 500 MG/50ML IV EMUL
INTRAVENOUS | Status: DC | PRN
  Administered 2021-09-24: 60 ug/kg/min via INTRAVENOUS

## 2021-09-24 MED ORDER — BUPIVACAINE-EPINEPHRINE (PF) 0.25% -1:200000 IJ SOLN
INTRAMUSCULAR | Status: AC
Start: 2021-09-24 — End: ?
  Filled 2021-09-24: qty 30

## 2021-09-24 MED ORDER — TRAMADOL HCL 50 MG PO TABS: 50.0000 mg | ORAL_TABLET | Freq: Four times a day (QID) | ORAL | 0 refills | Status: DC | PRN

## 2021-09-24 MED ORDER — LACTATED RINGERS IV SOLN: INTRAVENOUS | Status: DC

## 2021-09-24 MED ORDER — DIPHENOXYLATE-ATROPINE 2.5-0.025 MG PO TABS: 1.0000 | ORAL_TABLET | Freq: Four times a day (QID) | ORAL | 1 refills | Status: DC

## 2021-09-24 MED ORDER — VANCOMYCIN HCL IN DEXTROSE 1-5 GM/200ML-% IV SOLN: 1000.0000 mg | INTRAVENOUS | Status: DC

## 2021-09-24 MED ORDER — KETAMINE HCL 10 MG/ML IJ SOLN
INTRAMUSCULAR | Status: DC | PRN
  Administered 2021-09-24 (×2): 5 mg via INTRAVENOUS
  Administered 2021-09-24: 15 mg via INTRAVENOUS

## 2021-09-24 MED ORDER — OXYCODONE HCL 5 MG/5ML PO SOLN: 5.0000 mg | Freq: Once | ORAL | Status: DC | PRN

## 2021-09-24 MED ORDER — KETAMINE HCL 50 MG/5ML IJ SOSY
PREFILLED_SYRINGE | INTRAMUSCULAR | Status: AC
  Filled 2021-09-24: qty 5

## 2021-09-24 MED ORDER — FENTANYL CITRATE (PF) 100 MCG/2ML IJ SOLN
INTRAMUSCULAR | Status: AC
  Filled 2021-09-24: qty 2

## 2021-09-24 MED ORDER — FENTANYL CITRATE (PF) 100 MCG/2ML IJ SOLN: 25.0000 ug | INTRAMUSCULAR | Status: DC | PRN

## 2021-09-24 MED ORDER — FENTANYL CITRATE (PF) 100 MCG/2ML IJ SOLN
INTRAMUSCULAR | Status: DC | PRN
  Administered 2021-09-24 (×2): 25 ug via INTRAVENOUS

## 2021-09-24 MED ORDER — BUPIVACAINE-EPINEPHRINE (PF) 0.25% -1:200000 IJ SOLN
INTRAMUSCULAR | Status: DC | PRN
  Administered 2021-09-24: 18 mL

## 2021-09-24 MED ORDER — OXYCODONE HCL 5 MG PO TABS: 5.0000 mg | ORAL_TABLET | Freq: Once | ORAL | Status: DC | PRN

## 2021-09-24 MED ORDER — STERILE WATER FOR INJECTION IJ SOLN
INTRAMUSCULAR | 1 refills | Status: DC
Start: 2021-09-24 — End: 2022-02-06
  Filled 2021-09-24: qty 400, 10d supply, fill #0

## 2021-09-24 MED ORDER — ACETAMINOPHEN 10 MG/ML IV SOLN: 1000.0000 mg | Freq: Once | INTRAVENOUS | Status: DC | PRN

## 2021-09-24 MED ORDER — SODIUM CHLORIDE 0.9 % IV SOLN
INTRAVENOUS | Status: DC | PRN
  Administered 2021-09-24: 9 mL via INTRAMUSCULAR

## 2021-09-24 MED ORDER — CHLORHEXIDINE GLUCONATE 0.12 % MT SOLN: 15.0000 mL | Freq: Once | OROMUCOSAL | Status: AC

## 2021-09-24 MED ORDER — CEFAZOLIN SODIUM-DEXTROSE 2-4 GM/100ML-% IV SOLN
2.0000 g | Freq: Once | INTRAVENOUS | Status: AC
  Administered 2021-09-24: 2 g via INTRAVENOUS

## 2021-09-24 MED ORDER — HEPARIN SODIUM (PORCINE) 5000 UNIT/ML IJ SOLN
INTRAMUSCULAR | Status: AC
  Filled 2021-09-24: qty 1

## 2021-09-24 MED ORDER — LACTATED RINGERS IV SOLN: INTRAVENOUS | Status: DC | PRN

## 2021-09-24 MED ORDER — SODIUM CHLORIDE 0.9 % IV SOLN: INTRAVENOUS | Status: DC

## 2021-09-24 MED ORDER — CEFAZOLIN SODIUM-DEXTROSE 2-4 GM/100ML-% IV SOLN
INTRAVENOUS | Status: AC
  Filled 2021-09-24: qty 100

## 2021-09-24 MED ORDER — SODIUM CHLORIDE (PF) 0.9 % IJ SOLN
INTRAMUSCULAR | Status: AC
  Filled 2021-09-24: qty 50

## 2021-09-24 MED ORDER — ONDANSETRON HCL 4 MG/2ML IJ SOLN: 4.0000 mg | Freq: Once | INTRAMUSCULAR | Status: DC | PRN

## 2021-09-24 MED ORDER — MIDAZOLAM HCL 2 MG/2ML IJ SOLN
INTRAMUSCULAR | Status: AC
  Filled 2021-09-24: qty 2

## 2021-09-24 MED ORDER — PROPOFOL 10 MG/ML IV BOLUS
INTRAVENOUS | Status: DC | PRN
  Administered 2021-09-24: 20 mg via INTRAVENOUS
  Administered 2021-09-24: 30 mg via INTRAVENOUS

## 2021-09-24 MED ORDER — MIDAZOLAM HCL 2 MG/2ML IJ SOLN
INTRAMUSCULAR | Status: DC | PRN
  Administered 2021-09-24: 2 mg via INTRAVENOUS

## 2021-09-24 MED ORDER — CHLORHEXIDINE GLUCONATE 0.12 % MT SOLN
OROMUCOSAL | Status: AC
  Administered 2021-09-24: 15 mL via OROMUCOSAL
  Filled 2021-09-24: qty 15

## 2021-09-24 MED ORDER — BUPIVACAINE-EPINEPHRINE (PF) 0.25% -1:200000 IJ SOLN
INTRAMUSCULAR | Status: AC
  Filled 2021-09-24: qty 30

## 2021-09-24 SURGICAL SUPPLY — 37 items
BAG DECANTER FOR FLEXI CONT (MISCELLANEOUS) ×1 IMPLANT
BLADE SURG 11 STRL SS SAFETY (MISCELLANEOUS) ×1 IMPLANT
BLADE SURG SZ11 CARB STEEL (BLADE) ×1 IMPLANT
BOOT SUTURE AID YELLOW STND (SUTURE) ×1 IMPLANT
CHLORAPREP W/TINT 26 (MISCELLANEOUS) ×1 IMPLANT
COVER LIGHT HANDLE STERIS (MISCELLANEOUS) ×2 IMPLANT
DERMABOND ADVANCED (GAUZE/BANDAGES/DRESSINGS) ×1
DERMABOND ADVANCED .7 DNX12 (GAUZE/BANDAGES/DRESSINGS) ×1 IMPLANT
DRAPE C-ARM XRAY 36X54 (DRAPES) ×1 IMPLANT
ELECT REM PT RETURN 9FT ADLT (ELECTROSURGICAL) ×1
ELECTRODE REM PT RTRN 9FT ADLT (ELECTROSURGICAL) ×1 IMPLANT
GAUZE 4X4 16PLY ~~LOC~~+RFID DBL (SPONGE) ×1 IMPLANT
GLOVE BIO SURGEON STRL SZ 6.5 (GLOVE) ×1 IMPLANT
GLOVE BIOGEL PI IND STRL 6.5 (GLOVE) ×1 IMPLANT
GLOVE BIOGEL PI INDICATOR 6.5 (GLOVE) ×2
GOWN STRL REUS W/ TWL LRG LVL3 (GOWN DISPOSABLE) ×3 IMPLANT
GOWN STRL REUS W/TWL LRG LVL3 (GOWN DISPOSABLE) ×2
IV NS 500ML (IV SOLUTION) ×1
IV NS 500ML BAXH (IV SOLUTION) ×1 IMPLANT
KIT PORT POWER 8FR ISP CVUE (Port) ×1 IMPLANT
KIT TURNOVER KIT A (KITS) ×1 IMPLANT
LABEL OR SOLS (LABEL) ×1 IMPLANT
MANIFOLD NEPTUNE II (INSTRUMENTS) ×1 IMPLANT
NDL FILTER BLUNT 18X1 1/2 (NEEDLE) ×1 IMPLANT
NEEDLE FILTER BLUNT 18X 1/2SAF (NEEDLE) ×1
NEEDLE FILTER BLUNT 18X1 1/2 (NEEDLE) ×1 IMPLANT
PACK PORT-A-CATH (MISCELLANEOUS) ×1 IMPLANT
SUT MNCRL AB 4-0 PS2 18 (SUTURE) ×1 IMPLANT
SUT PROLENE 2 0 FS (SUTURE) ×1 IMPLANT
SUT VIC AB 2-0 SH 27 (SUTURE) ×1
SUT VIC AB 2-0 SH 27XBRD (SUTURE) ×1 IMPLANT
SUT VIC AB 3-0 SH 27 (SUTURE) ×1
SUT VIC AB 3-0 SH 27X BRD (SUTURE) ×1 IMPLANT
SYR 10ML LL (SYRINGE) ×2 IMPLANT
SYR 3ML LL SCALE MARK (SYRINGE) ×1 IMPLANT
TRAP FLUID SMOKE EVACUATOR (MISCELLANEOUS) ×1 IMPLANT
WATER STERILE IRR 500ML POUR (IV SOLUTION) ×1 IMPLANT

## 2021-09-24 NOTE — Transfer of Care (Addendum)
Immediate Anesthesia Transfer of Care Note  Patient: Vanessa Fisher  Procedure(s) Performed: INSERTION PORT-A-CATH (Chest)  Patient Location: PACU  Anesthesia Type:MAC  Level of Consciousness: drowsy  Airway & Oxygen Therapy: Patient Spontanous Breathing and Patient connected to face mask oxygen  Post-op Assessment: Report given to RN and Post -op Vital signs reviewed and stable  Post vital signs: Reviewed and stable  Last Vitals:  Vitals Value Taken Time  BP    Temp    Pulse    Resp    SpO2      Last Pain:  Vitals:   09/24/21 1328  TempSrc: Temporal  PainSc: 0-No pain      Patients Stated Pain Goal: 0 (48/88/91 6945)  Complications: No notable events documented.

## 2021-09-24 NOTE — Anesthesia Preprocedure Evaluation (Addendum)
Anesthesia Evaluation  Patient identified by MRN, date of birth, ID band Patient awake    Reviewed: Allergy & Precautions, NPO status , Patient's Chart, lab work & pertinent test results  History of Anesthesia Complications Negative for: history of anesthetic complications  Airway Mallampati: IV   Neck ROM: Full    Dental  (+) Lower Dentures, Upper Dentures   Pulmonary asthma ,    Pulmonary exam normal breath sounds clear to auscultation       Cardiovascular Exercise Tolerance: Good negative cardio ROS Normal cardiovascular exam Rhythm:Regular Rate:Normal     Neuro/Psych  Headaches, PSYCHIATRIC DISORDERS Anxiety Depression    GI/Hepatic GERD  ,  Endo/Other  diabetes, Type 2Obesity   Renal/GU Renal disease (nephrolithiasis)     Musculoskeletal  (+) Arthritis ,   Abdominal   Peds  Hematology negative hematology ROS (+)   Anesthesia Other Findings Last trulicity dose 7/40/81  Reproductive/Obstetrics                            Anesthesia Physical Anesthesia Plan  ASA: 2  Anesthesia Plan: General   Post-op Pain Management:    Induction: Intravenous  PONV Risk Score and Plan: 3 and Ondansetron, Dexamethasone, Treatment may vary due to age or medical condition, Propofol infusion and TIVA  Airway Management Planned: Natural Airway  Additional Equipment:   Intra-op Plan:   Post-operative Plan:   Informed Consent: I have reviewed the patients History and Physical, chart, labs and discussed the procedure including the risks, benefits and alternatives for the proposed anesthesia with the patient or authorized representative who has indicated his/her understanding and acceptance.       Plan Discussed with: CRNA  Anesthesia Plan Comments: (LMA/GETA backup discussed.  Patient consented for risks of anesthesia including but not limited to:  - adverse reactions to medications -  damage to eyes, teeth, lips or other oral mucosa - nerve damage due to positioning  - sore throat or hoarseness - damage to heart, brain, nerves, lungs, other parts of body or loss of life  Informed patient about role of CRNA in peri- and intra-operative care.  Patient voiced understanding.)       Anesthesia Quick Evaluation

## 2021-09-24 NOTE — Progress Notes (Addendum)
  Perioperative Services Pre-Admission/Anesthesia Testing    Date: 09/24/21  Name: Vanessa Fisher MRN:   [id number]  Re: Rx for mouthwash  Planned Surgical Procedure(s):    Case: [id number] Anesthesia Start Date/Time: 09/24/21 1429   Procedure: INSERTION PORT-A-CATH (Chest)   Anesthesia type: General   Pre-op diagnosis: C50.812, Z17.1 Malignant neoplasm of overlapping sites of lt breast in female, estrogen receptor negative   Location: [facility] OR ROOM 06 / [place] [facility]S FOR ANESTHESIA GROUP   Surgeons: Herbert Pun, MD   Clinical Notes:  Patient status post Port-A-Cath insertion.  Patient called cancer center providers earlier today to report severe diarrhea and thrush symptoms.  Rx for diarrhea called into patient's pharmacy by cancer center provider. Additionally, mouthwash Rx also sent, however she was unable to afford the prescribed medication.  Appreciate nurse navigator Laurance Flatten, RN) working with cancer center leadership to arrange for Rx to paid for via the [employer].  Rx was sent to the [facility] employee pharmacy to be filled.  In efforts to prevent patient from having to go to pharmacy after surgical procedure today, I presented to the [facility] pharmacy to pick up prescription for patient.  Rx was obtained and hand-delivered to patient by [facility] staff.  Rx is labeled with dosing information.  Patient was advised to return call to PAT and/or the cancer center should she have any questions or concerns related to the prescribed medications.  [name], MSN, APRN, FNP-C, CEN [facility]  Peri-operative Services Nurse Practitioner Phone: [phone] 09/24/21 4:56 PM  NOTE: This note has been prepared using Dragon dictation software. Despite my best ability to proofread, there is always the potential that unintentional transcriptional errors may still occur from this process.

## 2021-09-24 NOTE — Op Note (Signed)
SURGICAL PROCEDURE REPORT  DATE OF PROCEDURE: 09/24/2021   SURGEON: Dr. Windell Moment   ANESTHESIA: Local with light IV sedation   PRE-OPERATIVE DIAGNOSIS: Advanced breast cancer requiring durable central venous access for chemotherapy   POST-OPERATIVE DIAGNOSIS: Same  PROCEDURE(S): (cpt: 64403) 1.) Percutaneous access of Right internal jugular vein under ultrasound guidance 2.) Insertion of tunneled Right internal jugular central venous catheter with subcutaneous port  INTRAOPERATIVE FINDINGS: Patent easily compressible Right internal jugular vein with appropriate respiratory variations and well-secured tunneled central venous catheter with subcutaneous port at completion of the procedure  ESTIMATED BLOOD LOSS: Minimal (<20 mL)   SPECIMENS: None   IMPLANTS: 34F tunneled Bard PowerPort central venous catheter with subcutaneous port  DRAINS: None   COMPLICATIONS: None apparent   CONDITION AT COMPLETION: Hemodynamically stable, awake   DISPOSITION: PACU   INDICATION(S) FOR PROCEDURE:  Patient is a 61 y.o. female who presented with advanced breast cancer requiring durable central venous access for chemotherapy. All risks, benefits, and alternatives to above elective procedures were discussed with the patient, who elected to proceed, and informed consent was accordingly obtained at that time.  DETAILS OF PROCEDURE:  Patient was brought to the operative suite and appropriately identified. In Trendelenburg position, Right IJ venous access site was prepped and draped in the usual sterile fashion, and following a brief timeout, percutaneous Right IJ venous access was obtained under ultrasound guidance using Seldinger technique, by which local anesthetic was injected over the Right IJ vein, and access needle was inserted under direct ultrasound visualization into the Right IJ vein, through which soft guidewire was advanced, over which access needle was withdrawn. Guidewire was secured,  attention was directed to injection of local anesthetic along the planned tunnel site, 2-3 cm transverse Right chest incision was made and confirmed to accommodate the subcutaneous port, and flushed catheter was tunneled retrograde from the port site over the Right chest to the Right IJ access site with the attached port well-secured to the catheter and within the subcutaneous pocket. Insertion sheath was advanced over the guidewire, which was withdrawn along with the insertion sheath dilator. The catheter was introduced through the sheath and left on the [place] junction under fluoro guidance and catheter cut to desire lenght. Catheter connected to port and fixed to the pocket on two side to avoid twisting. Port was confirmed to withdraw blood and flush easily, after which concentrated heparin was instilled into the port and catheter. Dermis at the subcutaneous pocket was re-approximated using buried interrupted 3-0 Vicryl suture, and 4-0 Monocryl suture was used to re-approximate skin at the insertion and subcutaneous port sites in running subcuticular fashion for the subcutaneous port and buried interrupted fashion for the insertion site. Skin was cleaned, dried, and sterile skin glue was applied. Patient was then safely transferred to PACU for a chest x-ray. Ultrasound images are available on paper chart and Fluoroscopy guidance images are available in Epic.

## 2021-09-24 NOTE — Interval H&P Note (Signed)
History and Physical Interval Note:  09/24/2021 2:06 PM  Vanessa Fisher  has presented today for surgery, with the diagnosis of C50.812, Z17.1 Malignant neoplasm of overlapping sites of lt breast in female, estrogen receptor negative.  The various methods of treatment have been discussed with the patient and family. After consideration of risks, benefits and other options for treatment, the patient has consented to  Procedure(s): INSERTION PORT-A-CATH (N/A) as a surgical intervention.  The patient's history has been reviewed, patient examined, no change in status, stable for surgery.  I have reviewed the patient's chart and labs.  Questions were answered to the patient's satisfaction.     Herbert Pun

## 2021-09-24 NOTE — Progress Notes (Signed)
Patient having severe diarrhea, states imodium is not helping.   Dr. Tasia Catchings notified and sent in Rx for Lomotil.   Patient also stated she is not able to afford magic mouthwash, I have reached out to Ulice Dash to see if this can be covered by [employer], will let patient know when I hear back.

## 2021-09-24 NOTE — Progress Notes (Signed)
Magic mouthwash rx faxed to [facility] employee pharmacy to be covered under [employer] fund, approved by Ulice Dash

## 2021-09-24 NOTE — Discharge Instructions (Addendum)
  Diet: Resume home heart healthy regular diet.   Activity: Increase activity as tolerated. Light activity and walking are encouraged. Do not drive or drink alcohol if taking narcotic pain medications.  Wound care: May shower with soapy water and pat dry (do not rub incisions), but no baths or submerging incision underwater until follow-up. (no swimming)   Medications: Resume all home medications. For mild to moderate pain: acetaminophen (Tylenol) or ibuprofen (if no kidney disease). Combining Tylenol with alcohol can substantially increase your risk of causing liver disease. Narcotic pain medications, if prescribed, can be used for severe pain, though may cause nausea, constipation, and drowsiness. Do not combine Tylenol and Norco within a 6 hour period as Norco contains Tylenol. If you do not need the narcotic pain medication, you do not need to fill the prescription.  Call office ([phone]) at any time if any questions, worsening pain, fevers/chills, bleeding, drainage from incision site, or other concerns.   AMBULATORY SURGERY  DISCHARGE INSTRUCTIONS   The drugs that you were given will stay in your system until tomorrow so for the next 24 hours you should not:  Drive an automobile Make any legal decisions Drink any alcoholic beverage   You may resume regular meals tomorrow.  Today it is better to start with liquids and gradually work up to solid foods.  You may eat anything you prefer, but it is better to start with liquids, then soup and crackers, and gradually work up to solid foods.   Please notify your doctor immediately if you have any unusual bleeding, trouble breathing, redness and pain at the surgery site, drainage, fever, or pain not relieved by medication.    Additional Instructions:        Please contact your physician with any problems or Same Day Surgery at [phone], Monday through Friday 6 am to 4 pm, or [facility] [place] Main number at  [phone]. 

## 2021-09-25 ENCOUNTER — Encounter: Payer: Self-pay | Admitting: General Surgery

## 2021-09-25 NOTE — Anesthesia Postprocedure Evaluation (Signed)
Anesthesia Post Note  Patient: Vanessa Fisher  Procedure(s) Performed: INSERTION PORT-A-CATH (Chest)  Patient location during evaluation: PACU Anesthesia Type: General Level of consciousness: awake and alert Pain management: pain level controlled Vital Signs Assessment: post-procedure vital signs reviewed and stable Respiratory status: spontaneous breathing, nonlabored ventilation, respiratory function stable and patient connected to nasal cannula oxygen Cardiovascular status: blood pressure returned to baseline and stable Postop Assessment: no apparent nausea or vomiting Anesthetic complications: no   No notable events documented.   Last Vitals:  Vitals:   09/24/21 1556 09/24/21 1600  BP: (!) 106/40 114/73  Pulse: 72 65  Resp: 20 18  Temp: (!) 36.2 C   SpO2: 97% 100%    Last Pain:  Vitals:   09/24/21 1600  TempSrc:   PainSc: 0-No pain                 Molli Barrows

## 2021-09-29 ENCOUNTER — Inpatient Hospital Stay (HOSPITAL_BASED_OUTPATIENT_CLINIC_OR_DEPARTMENT_OTHER): Payer: Medicare HMO | Admitting: Hospice and Palliative Medicine

## 2021-09-29 ENCOUNTER — Other Ambulatory Visit: Payer: Self-pay

## 2021-09-29 VITALS — BP 106/55 | HR 76 | Temp 97.2°F | Resp 20 | Ht 66.0 in | Wt 215.0 lb

## 2021-09-29 DIAGNOSIS — I808 Phlebitis and thrombophlebitis of other sites: Secondary | ICD-10-CM | POA: Diagnosis not present

## 2021-09-29 DIAGNOSIS — C50812 Malignant neoplasm of overlapping sites of left female breast: Secondary | ICD-10-CM | POA: Diagnosis not present

## 2021-09-29 DIAGNOSIS — R110 Nausea: Secondary | ICD-10-CM | POA: Diagnosis not present

## 2021-09-29 DIAGNOSIS — Z171 Estrogen receptor negative status [ER-]: Secondary | ICD-10-CM | POA: Diagnosis not present

## 2021-09-29 DIAGNOSIS — Z5112 Encounter for antineoplastic immunotherapy: Secondary | ICD-10-CM | POA: Diagnosis not present

## 2021-09-29 DIAGNOSIS — I809 Phlebitis and thrombophlebitis of unspecified site: Secondary | ICD-10-CM | POA: Diagnosis not present

## 2021-09-29 DIAGNOSIS — Z5111 Encounter for antineoplastic chemotherapy: Secondary | ICD-10-CM | POA: Diagnosis not present

## 2021-09-29 DIAGNOSIS — Z5189 Encounter for other specified aftercare: Secondary | ICD-10-CM | POA: Diagnosis not present

## 2021-09-29 NOTE — Progress Notes (Signed)
Symptom Management and [place] at [facility] Telephone:([phone]) [phone] [phone]  Patient Care Team: [place] as PCP - General Daiva Huge, RN as Oncology Nurse Navigator   NAME OF PATIENT: Vanessa Fisher  [id number]  12/10/1960   DATE OF VISIT: 09/29/21  REASON FOR CONSULT: Vanessa Fisher is a 61 y.o. 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   INTERVAL HISTORY: Patient received cycle 1 TCHP chemotherapy on 09/12/2021.  Patient had pain at the site of her peripheral IV on the left wrist during chemoinfusion.  IV site was rotated to her right arm to complete chemotherapy infusion.   Doppler showed superficial thrombophlebitis. Patient was started on Eliquis.   She is now s/p port placement.   Patient presents for follow up.  Today, she reports that her arm is slowly improving.  There persistent discoloration of the blisters have resolved.  She still has some residual pain in the arm but states that this is improving.  She is taking as needed Percocet.  Patient reports resolution of diarrhea and nausea.  Denies any neurologic complaints. Denies recent fevers or illnesses. Denies any easy bleeding or bruising. Reports good appetite and denies weight loss. Denies chest pain. Denies any nausea, vomiting, constipation, or diarrhea. Denies urinary complaints. Patient offers no further specific complaints today.  SOCIAL HISTORY:     reports that she has quit smoking. Her smoking use included cigarettes. She has never used smokeless tobacco. She reports that she does not drink alcohol and does not use drugs.  Patient is unmarried.  She lives at home alone.  Her oldest son died of cancer.  She has a son and daughter who live nearby.  She also has a sister who is involved.  Patient previously worked at [employer].  ADVANCE DIRECTIVES:  Does not  have  CODE STATUS:   PAST MEDICAL HISTORY: Past Medical History:  Diagnosis Date   Anemia    Anxiety    Arthritis    Asthma    WELL CONTROLLED   Bile acid malabsorption syndrome    Bradycardia    HAD AN ISSUE WITH THIS IN 2016-NO PROBLEMS SINCE   Breast cancer, left breast ([place]) 08/2021   Carpal tunnel syndrome on right    Depression    Diabetes mellitus without complication (HCC)    Diverticulitis    Gastric reflux    GERD (gastroesophageal reflux disease)    Hand pain 05/12/2016   Headache    H/O MIGRAINES   History of kidney stones    H/O   Lumbar radiculitis    Neck pain, chronic    Osteoarthritis of both knees    Panic attacks     PAST SURGICAL HISTORY:  Past Surgical History:  Procedure Laterality Date   BREAST BIOPSY Left 08/21/2021   Axilla Bx, Hydromarker, path pending   BREAST BIOPSY Left 08/21/2021   [place] Bx, Ribbon Clip, Path Pending   CARPAL TUNNEL RELEASE Right 12/06/2017   Procedure: CARPAL TUNNEL RELEASE;  Surgeon: Earnestine Leys, MD;  Location: [facility] ORS;  Service: Orthopedics;  Laterality: Right;   COLONOSCOPY WITH PROPOFOL N/A 06/11/2021   Procedure: COLONOSCOPY WITH PROPOFOL;  Surgeon: Lin Landsman, MD;  Location: [facility] ENDOSCOPY;  Service: Gastroenterology;  Laterality: N/A;   DORSAL COMPARTMENT RELEASE Right 12/06/2017   Procedure: RELEASE DORSAL COMPARTMENT (DEQUERVAIN);  Surgeon: Earnestine Leys, MD;  Location: [facility] ORS;  Service: Orthopedics;  Laterality:  Right;   ESOPHAGOGASTRODUODENOSCOPY (EGD) WITH PROPOFOL N/A 06/11/2021   Procedure: ESOPHAGOGASTRODUODENOSCOPY (EGD) WITH PROPOFOL;  Surgeon: Lin Landsman, MD;  Location: [facility];  Service: Gastroenterology;  Laterality: N/A;   PARTIAL HYSTERECTOMY     PORTACATH PLACEMENT N/A 09/24/2021   Procedure: INSERTION PORT-A-CATH;  Surgeon: Herbert Pun, MD;  Location: [facility] ORS;  Service: General;  Laterality: N/A;   TUBAL LIGATION      HEMATOLOGY/ONCOLOGY HISTORY:   Oncology History  Breast cancer ([place])  07/31/2021 Imaging   Bilateral diagnostic mammogram and US showed 5 centimeter LEFT breast mass associated with pleomorphic calcifications is suspicious for invasive ductal carcinoma.At least 4 LEFT axillary lymph nodes with abnormal morphology   08/21/2021 Cancer Staging   Staging form: Breast, AJCC 8th Edition - Clinical: Stage IIIB (cT4b, cN3c, cM0, G3, ER-, PR-, HER2+) - Signed by Earlie Server, MD on 08/28/2021 Histologic grading system: 3 grade system  -08/21/21 left breast ultrasound-guided biopsy showed invasive mammary carcinoma, grade 3, ER/PR negative, HER2 positive.  Left axillary lymph node biopsy positive for macro metastatic mammary carcinoma, 8 mm in greatest extent.   08/30/2021 Imaging   MRI brain w wo contrast -No metastatic disease or acute intracranial abnormality. Essentially normal for age MRI appearance of the brain.    09/03/2021 Echocardiogram   1. Left ventricular ejection fraction, by estimation, is 55 to 60%. Left ventricular ejection fraction by 3D volume is 58 %. The left ventricle has normal function. The left ventricle has no regional wall motion abnormalities. There is mild left  ventricular hypertrophy. Left ventricular diastolic parameters were normal.  2. Right ventricular systolic function is normal. The right ventricular size is normal.  3. The mitral valve is normal in structure. No evidence of mitral valve regurgitation.  4. The aortic valve was not well visualized. Aortic valve regurgitation is not visualized   09/10/2021 Imaging   PET scan showed Large hypermetabolic left breast mass and diffuse skin thickening,consistent with primary breast carcinoma. Hypermetabolic lymphadenopathy in the left axilla, left subpectoral region, and left supraclavicular region, consistent with metastatic disease. No evidence of metastatic disease within the abdomen or pelvis   09/12/2021 -  Chemotherapy   Patient is on Treatment Plan  : BREAST  Docetaxel + Carboplatin + Trastuzumab + Pertuzumab  (TCHP) q21d        ALLERGIES:  is allergic to bc powder [aspirin-salicylamide-caffeine], gabapentin, hydrocodone-acetaminophen, latex, penicillin g, penicillins, and shrimp [shellfish allergy].  MEDICATIONS:  Current Outpatient Medications  Medication Sig Dispense Refill   albuterol (PROVENTIL) (2.5 MG/3ML) 0.083% nebulizer solution Take 3 mLs (2.5 mg total) by nebulization every 4 (four) hours as needed for wheezing or shortness of breath. 75 mL 2   albuterol (VENTOLIN HFA) 108 (90 Base) MCG/ACT inhaler Inhale 2 puffs into the lungs every 6 (six) hours as needed for wheezing or shortness of breath. 18 g 0   apixaban (ELIQUIS) 2.5 MG TABS tablet Take 2.5 mg by mouth 2 (two) times daily.     aspirin 81 MG chewable tablet Chew 81 mg by mouth daily.     dexamethasone (DECADRON) 4 MG tablet Take 1 tablet (4 mg total) by mouth 2 (two) times daily. 10 tablet 0   diphenoxylate-atropine (LOMOTIL) 2.5-0.025 MG tablet Take 1 tablet by mouth 4 (four) times daily. 60 tablet 1   FLOVENT DISKUS 250 MCG/ACT AEPB Inhale 1 puff into the lungs daily.     fluticasone (FLONASE) 50 MCG/ACT nasal spray Place 2 sprays into both nostrils daily. 15.8 mL 0  lidocaine-prilocaine (EMLA) cream Apply 1 Application topically as needed. 30 g 12   lisinopril-hydrochlorothiazide (ZESTORETIC) 10-12.5 MG tablet Take 1 tablet by mouth daily.     loperamide (IMODIUM) 2 MG capsule Take 2 mg by mouth as needed for diarrhea or loose stools.     loratadine (CLARITIN) 10 MG tablet Take 1 tablet (10 mg total) by mouth daily. 30 tablet 2   magic mouthwash (multi-ingredient) oral suspension Swish and spit 5-10 ml by  mouth 4 times a day as needed 400 mL 1   montelukast (SINGULAIR) 10 MG tablet Take by mouth.     ondansetron (ZOFRAN) 8 MG tablet Take 1 tablet (8 mg total) by mouth 2 (two) times daily as needed (Nausea or vomiting). Start on the third day after chemotherapy. 30  tablet 1   oxyCODONE-acetaminophen (PERCOCET/ROXICET) 5-325 MG tablet Take 1 tablet by mouth every 6 (six) hours as needed for pain.     pantoprazole (PROTONIX) 40 MG tablet Take 40 mg by mouth daily.     traMADol (ULTRAM) 50 MG tablet Take 1 tablet (50 mg total) by mouth every 6 (six) hours as needed. 10 tablet 0   TRULICITY 3 PO/2.4MP SOPN Inject 0.75 mg into the skin once a week. Monday     dexamethasone (DECADRON) 4 MG tablet Take 2 tablets (8 mg total) by mouth daily. Start the day before Taxotere. Then take daily x 2 days after chemotherapy. (Patient not taking: Reported on 09/29/2021) 30 tablet 1   magic mouthwash w/lidocaine SOLN Take 5 mLs by mouth 4 (four) times daily as needed for mouth pain. Sig: Swish/Swallow 5-10 ml four times a day as needed. Dispense 480 ml. 1RF (Patient not taking: Reported on 09/29/2021) 480 mL 1   naloxone (NARCAN) nasal spray 4 mg/0.1 mL  (Patient not taking: Reported on 09/29/2021)     prochlorperazine (COMPAZINE) 10 MG tablet Take 1 tablet (10 mg total) by mouth every 6 (six) hours as needed (Nausea or vomiting). (Patient not taking: Reported on 09/29/2021) 30 tablet 1   No current facility-administered medications for this visit.    VITAL SIGNS: BP (!) 106/55   Pulse 76   Temp (!) 97.2 F (36.2 C) (Tympanic)   Resp 20   Ht '5\' 6"'  (1.676 m)   Wt 215 lb (97.5 kg)   BMI 34.70 kg/m  Filed Weights   09/29/21 1108  Weight: 215 lb (97.5 kg)    Estimated body mass index is 34.7 kg/m as calculated from the following:   Height as of this encounter: '5\' 6"'  (1.676 m).   Weight as of this encounter: 215 lb (97.5 kg).  LABS: CBC:    Component Value Date/Time   WBC 14.9 (H) 09/16/2021 1253   HGB 11.6 (L) 09/16/2021 1253   HGB 11.9 (L) 05/27/2014 2215   HCT 36.9 09/16/2021 1253   HCT 36.4 05/27/2014 2215   PLT 219 09/16/2021 1253   PLT 293 05/27/2014 2215   MCV 83.5 09/16/2021 1253   MCV 81 05/27/2014 2215   NEUTROABS 12.1 (H) 09/16/2021 1253    NEUTROABS 5.4 05/27/2014 2215   LYMPHSABS 1.6 09/16/2021 1253   LYMPHSABS 3.0 05/27/2014 2215   MONOABS 0.1 09/16/2021 1253   MONOABS 0.6 05/27/2014 2215   EOSABS 0.1 09/16/2021 1253   EOSABS 0.0 05/27/2014 2215   BASOSABS 0.1 09/16/2021 1253   BASOSABS 0.0 05/27/2014 2215   Comprehensive Metabolic Panel:    Component Value Date/Time   NA 138 09/16/2021 1253   NA  143 07/08/2016 1259   NA 139 05/27/2014 2215   K 3.5 09/16/2021 1253   K 3.5 05/27/2014 2215   CL 103 09/16/2021 1253   CL 105 05/27/2014 2215   CO2 29 09/16/2021 1253   CO2 26 05/27/2014 2215   BUN 11 09/16/2021 1253   BUN 10 07/08/2016 1259   BUN 14 05/27/2014 2215   CREATININE 0.74 09/16/2021 1253   CREATININE 1.09 (H) 05/27/2014 2215   GLUCOSE 105 (H) 09/16/2021 1253   GLUCOSE 104 (H) 05/27/2014 2215   CALCIUM 8.3 (L) 09/16/2021 1253   CALCIUM 9.0 05/27/2014 2215   AST 24 09/16/2021 1253   AST < 5 (L) 08/05/2013 0832   ALT 26 09/16/2021 1253   ALT 24 08/05/2013 0832   ALKPHOS 87 09/16/2021 1253   ALKPHOS 97 08/05/2013 0832   BILITOT 0.5 09/16/2021 1253   BILITOT 0.4 07/08/2016 1259   BILITOT 0.4 08/05/2013 0832   PROT 6.9 09/16/2021 1253   PROT 7.0 07/08/2016 1259   PROT 6.8 08/05/2013 0832   ALBUMIN 3.5 09/16/2021 1253   ALBUMIN 4.0 07/08/2016 1259   ALBUMIN 3.4 08/05/2013 0832    RADIOGRAPHIC STUDIES: DG Chest Port 1 View  Result Date: 09/24/2021 CLINICAL DATA:  Port placement EXAM: PORTABLE CHEST 1 VIEW COMPARISON:  10/09/2020 FINDINGS: Interval placement of right IJ approach chest port with distal tip terminating at the level of the distal SVC. Heart size within normal limits. No focal airspace consolidation, pleural effusion, or pneumothorax. IMPRESSION: Satisfactory placement of right IJ approach chest port. No pneumothorax. Electronically Signed   By: Davina Poke D.O.   On: 09/24/2021 15:41   DG C-Arm 1-60 Min-No Report  Result Date: 09/24/2021 Fluoroscopy was utilized by the requesting  physician.  No radiographic interpretation.   US Venous Img Upper Uni Left  Result Date: 09/22/2021 CLINICAL DATA:  Edema at injection site left wrist EXAM: Left UPPER EXTREMITY VENOUS DOPPLER ULTRASOUND TECHNIQUE: Gray-scale sonography with graded compression, as well as color Doppler and duplex ultrasound were performed to evaluate the upper extremity deep venous system from the level of the subclavian vein and including the jugular, axillary, basilic, radial, ulnar and upper cephalic vein. Spectral Doppler was utilized to evaluate flow at rest and with distal augmentation maneuvers. COMPARISON:  None Available. FINDINGS: Contralateral Subclavian Vein: Respiratory phasicity is normal and symmetric with the symptomatic side. No evidence of thrombus. Normal compressibility. Internal Jugular Vein: No evidence of thrombus. Normal compressibility, respiratory phasicity and response to augmentation. Subclavian Vein: No evidence of thrombus. Normal compressibility, respiratory phasicity and response to augmentation. Axillary Vein: No evidence of thrombus. Normal compressibility, respiratory phasicity and response to augmentation. Cephalic Vein: Positive for occlusive thrombus within the distal cephalic vein from the wrist to the elbow. Noncompressible. Basilic Vein: Positive for nonocclusive thrombus within the proximal to mid basilic vein. Noncompressible. Brachial Veins: No evidence of thrombus.  Normal compressibility, Radial Veins: No evidence of thrombus.  Normal compressibility,. Ulnar Veins: No evidence of thrombus.  Normal compressibility, IMPRESSION: 1. Negative for acute DVT within the left upper extremity. 2. Positive for acute thrombus/superficial thrombophlebitis of the left cephalic and basilic veins. These results will be called to the ordering clinician or representative by the Radiologist Assistant, and communication documented in the PACS or [employer]. Electronically Signed   By: Donavan Foil M.D.   On: 09/22/2021 16:23   NM PET Image Initial (PI) Skull Base To Thigh  Result Date: 09/10/2021 CLINICAL DATA:  Initial treatment strategy for left  breast carcinoma. EXAM: NUCLEAR MEDICINE PET SKULL BASE TO THIGH TECHNIQUE: 12.0 mCi F-18 FDG was injected intravenously. Full-ring PET imaging was performed from the skull base to thigh after the radiotracer. CT data was obtained and used for attenuation correction and anatomic localization. Fasting blood glucose: 101 mg/dl COMPARISON:  AP CT on 04/17/2021 FINDINGS: Mediastinal blood-pool activity (background): SUV max = 2.0 Liver activity (reference): SUV max = N/A NECK:  No hypermetabolic lymph nodes or masses. Incidental CT findings:  None. CHEST: Large multilobular mass and diffuse skin thickening is seen in the left breast. This has SUV max 14.9 consistent primary breast carcinoma. Hypermetabolic lymphadenopathy is seen in the left axilla measuring up to 3.1 x 2.5 cm with SUV max of 12.2. A few sub-cm left subpectoral lymph nodes are also seen with SUV max of 2.8. A few sub-cm left supraclavicular lymph nodes are also seen which has a SUV max of 3.3. No hypermetabolic mediastinal or hilar lymph nodes identified. No suspicious pulmonary nodules or masses seen on CT images. No evidence of pulmonary infiltrate or pleural effusion. Incidental CT findings:  None. ABDOMEN/PELVIS: No abnormal hypermetabolic activity within the liver, pancreas, adrenal glands, or spleen. No hypermetabolic lymph nodes in the abdomen or pelvis. Incidental CT findings:  None. SKELETON: No focal hypermetabolic bone lesions to suggest skeletal metastasis. Incidental CT findings:  None. IMPRESSION: Large hypermetabolic left breast mass and diffuse skin thickening, consistent with primary breast carcinoma. Hypermetabolic lymphadenopathy in the left axilla, left subpectoral region, and left supraclavicular region, consistent with metastatic disease. No evidence of metastatic  disease within the abdomen or pelvis. Electronically Signed   By: Marlaine Hind M.D.   On: 09/10/2021 14:41   ECHOCARDIOGRAM COMPLETE  Result Date: 09/03/2021    ECHOCARDIOGRAM REPORT   Patient Name:   MERITA HAWKS Date of Exam: 09/03/2021 Medical Rec #:  [id number]            Height:       66.0 in Accession #:    [id number]           Weight:       216.0 lb Date of Birth:  Jan 31, 1961            BSA:          2.066 m Patient Age:    7 years             BP:           132/69 mmHg Patient Gender: F                    HR:           57 bpm. Exam Location:  [facility] Procedure: 2D Echo, Cardiac Doppler, Color Doppler and Strain Analysis Indications:     Chemo Z09  History:         Patient has no prior history of Echocardiogram examinations.                  Risk Factors:Diabetes. Bradycardia.  Sonographer:     Sherrie Sport Referring Phys:  [id number] ZHOU YU Diagnosing Phys: Kate Sable MD  Sonographer Comments: Suboptimal apical window. Global longitudinal strain was attempted. IMPRESSIONS  1. Left ventricular ejection fraction, by estimation, is 55 to 60%. Left ventricular ejection fraction by 3D volume is 58 %. The left ventricle has normal function. The left ventricle has no regional wall motion abnormalities. There is mild left ventricular hypertrophy. Left ventricular diastolic parameters were normal.  2. Right ventricular systolic function is normal. The right ventricular size is normal.  3. The mitral valve is normal in structure. No evidence of mitral valve regurgitation.  4. The aortic valve was not well visualized. Aortic valve regurgitation is not visualized. FINDINGS  Left Ventricle: Left ventricular ejection fraction, by estimation, is 55 to 60%. Left ventricular ejection fraction by 3D volume is 58 %. The left ventricle has normal function. The left ventricle has no regional wall motion abnormalities. Global longitudinal strain performed but not reported based on interpreter judgement due to suboptimal  tracking. The left ventricular internal cavity size was normal in size. There is mild left ventricular hypertrophy. Left ventricular diastolic parameters were  normal. Right Ventricle: The right ventricular size is normal. No increase in right ventricular wall thickness. Right ventricular systolic function is normal. Left Atrium: Left atrial size was normal in size. Right Atrium: Right atrial size was normal in size. Pericardium: There is no evidence of pericardial effusion. Mitral Valve: The mitral valve is normal in structure. No evidence of mitral valve regurgitation. Tricuspid Valve: The tricuspid valve is normal in structure. Tricuspid valve regurgitation is trivial. Aortic Valve: The aortic valve was not well visualized. Aortic valve regurgitation is not visualized. Aortic valve mean gradient measures 4.0 mmHg. Aortic valve peak gradient measures 6.4 mmHg. Aortic valve area, by VTI measures 2.42 cm. Pulmonic Valve: The pulmonic valve was not well visualized. Pulmonic valve regurgitation is not visualized. Aorta: The aortic root is normal in size and structure. Venous: The inferior vena cava was not well visualized. IAS/Shunts: No atrial level shunt detected by color flow Doppler.  LEFT VENTRICLE PLAX 2D LVIDd:         3.70 cm         Diastology LVIDs:         2.50 cm         LV e' medial:    7.94 cm/s LV PW:         1.20 cm         LV E/e' medial:  11.6 LV IVS:        1.10 cm         LV e' lateral:   10.20 cm/s LVOT diam:     2.00 cm         LV E/e' lateral: 9.1 LV SV:         70 LV SV Index:   34 LVOT Area:     3.14 cm        3D Volume EF                                LV 3D EF:    Left                                             ventricul                                             ar  ejection                                             fraction                                             by 3D                                             volume is                                              58 %.                                 3D Volume EF:                                3D EF:        58 %                                LV EDV:       243 ml                                LV ESV:       103 ml                                LV SV:        140 ml RIGHT VENTRICLE RV Basal diam:  3.70 cm RV S prime:     11.90 cm/s TAPSE (M-mode): 2.2 cm LEFT ATRIUM             Index        RIGHT ATRIUM           Index LA diam:        3.10 cm 1.50 cm/m   RA Area:     12.80 cm LA Vol (A2C):   60.0 ml 29.04 ml/m  RA Volume:   31.30 ml  15.15 ml/m LA Vol (A4C):   31.6 ml 15.29 ml/m LA Biplane Vol: 44.5 ml 21.54 ml/m  AORTIC VALVE AV Area (Vmax):    2.54 cm AV Area (Vmean):   2.12 cm AV Area (VTI):     2.42 cm AV Vmax:           126.00 cm/s AV Vmean:          96.100 cm/s AV VTI:            0.288 m AV Peak Grad:      6.4 mmHg AV Mean Grad:      4.0 mmHg LVOT Vmax:         102.00  cm/s LVOT Vmean:        64.800 cm/s LVOT VTI:          0.222 m LVOT/AV VTI ratio: 0.77  AORTA Ao Root diam: 2.53 cm MITRAL VALVE               TRICUSPID VALVE MV Area (PHT): 4.46 cm    TR Peak grad:   16.3 mmHg MV Decel Time: 170 msec    TR Vmax:        202.00 cm/s MV E velocity: 92.50 cm/s MV A velocity: 60.00 cm/s  SHUNTS MV E/A ratio:  1.54        Systemic VTI:  0.22 m                            Systemic Diam: 2.00 cm Kate Sable MD Electronically signed by Kate Sable MD Signature Date/Time: 09/03/2021/3:18:34 PM    Final     PERFORMANCE STATUS (ECOG) : 1 - Symptomatic but completely ambulatory  Review of Systems Unless otherwise noted, a complete review of systems is negative.  Physical Exam General: NAD Cardiovascular: regular rate and rhythm Pulmonary: clear ant fields Abdomen: soft, nontender, + bowel sounds GU: no suprapubic tenderness Extremities: no edema, no joint deformities Skin: no rashes Neurological: Weakness but otherwise nonfocal     IMPRESSION/PLAN: Superficial  thrombophlebitis -continue supportive care.  Continue Eliquis for 2 weeks.  Patient to follow-up next week with Dr. Tasia Catchings.  Case, plan, and pictures discussed with Dr. Tasia Catchings.    Patient expressed understanding and was in agreement with this plan. She also understands that She can call clinic at any time with any questions, concerns, or complaints.   Thank you for allowing me to participate in the care of this very pleasant patient.   Time Total: 15 minutes  Visit consisted of counseling and education dealing with the complex and emotionally intense issues of symptom management in the setting of serious illness.Greater than 50%  of this time was spent counseling and coordinating care related to the above assessment and plan.  Signed by: Altha Harm, PhD, NP-C

## 2021-10-07 ENCOUNTER — Inpatient Hospital Stay: Payer: Medicare HMO | Attending: Oncology

## 2021-10-07 ENCOUNTER — Encounter: Payer: Self-pay | Admitting: Oncology

## 2021-10-07 ENCOUNTER — Inpatient Hospital Stay (HOSPITAL_BASED_OUTPATIENT_CLINIC_OR_DEPARTMENT_OTHER): Payer: Medicare HMO | Admitting: Oncology

## 2021-10-07 DIAGNOSIS — Z5189 Encounter for other specified aftercare: Secondary | ICD-10-CM | POA: Diagnosis not present

## 2021-10-07 DIAGNOSIS — I808 Phlebitis and thrombophlebitis of other sites: Secondary | ICD-10-CM | POA: Diagnosis not present

## 2021-10-07 DIAGNOSIS — N390 Urinary tract infection, site not specified: Secondary | ICD-10-CM | POA: Insufficient documentation

## 2021-10-07 DIAGNOSIS — Z171 Estrogen receptor negative status [ER-]: Secondary | ICD-10-CM

## 2021-10-07 DIAGNOSIS — T451X5A Adverse effect of antineoplastic and immunosuppressive drugs, initial encounter: Secondary | ICD-10-CM | POA: Insufficient documentation

## 2021-10-07 DIAGNOSIS — R310 Gross hematuria: Secondary | ICD-10-CM | POA: Diagnosis not present

## 2021-10-07 DIAGNOSIS — Z5112 Encounter for antineoplastic immunotherapy: Secondary | ICD-10-CM | POA: Insufficient documentation

## 2021-10-07 DIAGNOSIS — K521 Toxic gastroenteritis and colitis: Secondary | ICD-10-CM | POA: Insufficient documentation

## 2021-10-07 DIAGNOSIS — Z452 Encounter for adjustment and management of vascular access device: Secondary | ICD-10-CM | POA: Diagnosis not present

## 2021-10-07 DIAGNOSIS — I809 Phlebitis and thrombophlebitis of unspecified site: Secondary | ICD-10-CM | POA: Insufficient documentation

## 2021-10-07 DIAGNOSIS — D6481 Anemia due to antineoplastic chemotherapy: Secondary | ICD-10-CM | POA: Insufficient documentation

## 2021-10-07 DIAGNOSIS — Z5111 Encounter for antineoplastic chemotherapy: Secondary | ICD-10-CM

## 2021-10-07 DIAGNOSIS — C50812 Malignant neoplasm of overlapping sites of left female breast: Secondary | ICD-10-CM

## 2021-10-07 DIAGNOSIS — Z87440 Personal history of urinary (tract) infections: Secondary | ICD-10-CM | POA: Diagnosis not present

## 2021-10-07 DIAGNOSIS — Z7901 Long term (current) use of anticoagulants: Secondary | ICD-10-CM | POA: Insufficient documentation

## 2021-10-07 DIAGNOSIS — E860 Dehydration: Secondary | ICD-10-CM | POA: Insufficient documentation

## 2021-10-07 DIAGNOSIS — D649 Anemia, unspecified: Secondary | ICD-10-CM | POA: Insufficient documentation

## 2021-10-07 LAB — COMPREHENSIVE METABOLIC PANEL
ALT: 20 U/L (ref 0–44)
AST: 21 U/L (ref 15–41)
Albumin: 3.7 g/dL (ref 3.5–5.0)
Alkaline Phosphatase: 91 U/L (ref 38–126)
Anion gap: 11 (ref 5–15)
BUN: 13 mg/dL (ref 6–20)
CO2: 27 mmol/L (ref 22–32)
Calcium: 9 mg/dL (ref 8.9–10.3)
Chloride: 102 mmol/L (ref 98–111)
Creatinine, Ser: 0.84 mg/dL (ref 0.44–1.00)
GFR, Estimated: >60 mL/min (ref >60)
Glucose, Bld: 141 mg/dL — ABNORMAL HIGH (ref 70–99)
Potassium: 3.5 mmol/L (ref 3.5–5.1)
Sodium: 140 mmol/L (ref 135–145)
Total Bilirubin: 0.5 mg/dL (ref 0.3–1.2)
Total Protein: 7.2 g/dL (ref 6.5–8.1)

## 2021-10-07 LAB — CBC WITH DIFFERENTIAL/PLATELET
Abs Immature Granulocytes: 0.03 10*3/uL (ref 0.00–0.07)
Basophils Absolute: 0 10*3/uL (ref 0.0–0.1)
Basophils Relative: 0 %
Eosinophils Absolute: 0 10*3/uL (ref 0.0–0.5)
Eosinophils Relative: 0 %
HCT: 33.3 % — ABNORMAL LOW (ref 36.0–46.0)
Hemoglobin: 10.5 g/dL — ABNORMAL LOW (ref 12.0–15.0)
Immature Granulocytes: 0 %
Lymphocytes Relative: 12 %
Lymphs Abs: 1 10*3/uL (ref 0.7–4.0)
MCH: 26.1 pg (ref 26.0–34.0)
MCHC: 31.5 g/dL (ref 30.0–36.0)
MCV: 82.8 fL (ref 80.0–100.0)
Monocytes Absolute: 0.2 10*3/uL (ref 0.1–1.0)
Monocytes Relative: 3 %
Neutro Abs: 6.6 10*3/uL (ref 1.7–7.7)
Neutrophils Relative %: 85 %
Platelets: 376 10*3/uL (ref 150–400)
RBC: 4.02 MIL/uL (ref 3.87–5.11)
RDW: 15.6 % — ABNORMAL HIGH (ref 11.5–15.5)
WBC: 7.8 10*3/uL (ref 4.0–10.5)
nRBC: 0 % (ref 0.0–0.2)

## 2021-10-07 MED ORDER — APIXABAN 2.5 MG PO TABS: 2.5000 mg | ORAL_TABLET | Freq: Two times a day (BID) | ORAL | 0 refills | Status: DC

## 2021-10-07 MED FILL — Dexamethasone Sodium Phosphate Inj 100 MG/10ML: INTRAMUSCULAR | Qty: 1 | Status: AC

## 2021-10-07 MED FILL — Fosaprepitant Dimeglumine For IV Infusion 150 MG (Base Eq): INTRAVENOUS | Qty: 5 | Status: AC

## 2021-10-07 NOTE — Assessment & Plan Note (Addendum)
Left breast cancer, cT4b N3 Mx, ER/PR-, HER2 + On Neoadjuvant chemotherapy TCHP She agrees with the plan.  Labs are reviewed and discussed with patient. Proceed with TCHP, D3 Uden[place].

## 2021-10-07 NOTE — Progress Notes (Signed)
Hematology/Oncology Progress note [phone] [phone]         Patient Care Team: [place] as PCP - General Daiva Huge, RN as Oncology Nurse Navigator  REFERRING PROVIDER: Earlie Server, MD   ASSESSMENT & PLAN:   Cancer Staging  Breast cancer [facility]) Staging form: Breast, AJCC 8th Edition - Clinical stage from 08/21/2021: Stage IIIB (cT4b, cN3c, cM0, G3, ER-, PR-, HER2+) - Signed by Earlie Server, MD on 09/12/2021  Breast cancer ([place]) Left breast cancer, cT4b N3 Mx, ER/PR-, HER2 + On Neoadjuvant chemotherapy TCHP She agrees with the plan.  Labs are reviewed and discussed with patient. Proceed with TCHP, D3 Udenyca.    Encounter for antineoplastic chemotherapy Treatment plan as listed above  Anemia due to antineoplastic chemotherapy hemoglobin has decreased as expected.  Monitor counts.   Chemotherapy induced diarrhea Continue lomotil PRN  Thrombophlebitis Improved, still some residual swelling Continue Eliquis 2.57m BID    Orders Placed This Encounter  Procedures   CBC with Differential/Platelet    Standing Status:   Future    Standing Expiration Date:   10/08/2022   Comprehensive metabolic panel    Standing Status:   Future    Standing Expiration Date:   10/07/2022    All questions were answered. The patient knows to call the clinic with any problems questions or concerns.  Return of visit:  Treatment tmr,  Lab NP + IVF 1 week flex.  3 weeks. Lab MD TCHP w D3 uSula Rumple MD, [facility] 10/07/2021     CHIEF COMPLAINTS/REASON FOR VISIT:  Follow-up for breast cancer  HISTORY OF PRESENTING ILLNESS:  SLAMEES GABLEis a  61y.o.  female with PMH listed below who was referred to me for evaluation of  left breast mass SUMMARY OF ONCOLOGIC HISTORY: Oncology History  Breast cancer ([facility]  07/31/2021 Imaging   Bilateral diagnostic mammogram and [place]owed 5 centimeter LEFT breast mass associated  with pleomorphic calcifications is suspicious for invasive ductal carcinoma.At least 4 LEFT axillary lymph nodes with abnormal morphology   08/21/2021 Cancer Staging   Staging form: Breast, AJCC 8th Edition - Clinical: Stage IIIB (cT4b, cN3c, cM0, G3, ER-, PR-, HER2+) - Signed by YEarlie Server MD on 08/28/2021 Histologic grading system: 3 grade system  -08/21/21 left breast ultrasound-guided biopsy showed invasive mammary carcinoma, grade 3, ER/PR negative, HER2 positive.  Left axillary lymph node biopsy positive for macro metastatic mammary carcinoma, 8 mm in greatest extent.   08/30/2021 Imaging   MRI brain w wo contrast -No metastatic disease or acute intracranial abnormality. Essentially normal for age MRI appearance of the brain.    09/03/2021 Echocardiogram   1. Left ventricular ejection fraction, by estimation, is 55 to 60%. Left ventricular ejection fraction by 3D volume is 58 %. The left ventricle has normal function. The left ventricle has no regional wall motion abnormalities. There is mild left  ventricular hypertrophy. Left ventricular diastolic parameters were normal.  2. Right ventricular systolic function is normal. The right ventricular size is normal.  3. The mitral valve is normal in structure. No evidence of mitral valve regurgitation.  4. The aortic valve was not well visualized. Aortic valve regurgitation is not visualized   09/10/2021 Imaging   PET scan showed Large hypermetabolic left breast mass and diffuse skin thickening,consistent with primary breast carcinoma. Hypermetabolic lymphadenopathy in the left axilla, left subpectoral region, and left supraclavicular region, consistent with metastatic disease. No evidence of metastatic disease  within the abdomen or pelvis   09/12/2021 -  Chemotherapy   Patient is on Treatment Plan : BREAST  Docetaxel + Carboplatin + Trastuzumab + Pertuzumab  (TCHP) q21d        INTERVAL HISTORY Elfrida D Zadrozny is a 61 y.o. female who has  above history reviewed by me today presents for follow up visit for Triple positive breast cancer treatment.  Tolerated cycle 1 neoadjuvant TCHP with GCSF support.  She feels well today.  Intermittent diarrhea, lomotil helps.  S/p port placement Left hand superficial thrombophlebitis, improved  MEDICAL HISTORY:  Past Medical History:  Diagnosis Date   Anemia    Anxiety    Arthritis    Asthma    WELL CONTROLLED   Bile acid malabsorption syndrome    Bradycardia    HAD AN ISSUE WITH THIS IN 2016-NO PROBLEMS SINCE   Breast cancer, left breast (HCC) 08/2021   Carpal tunnel syndrome on right    Depression    Diabetes mellitus without complication (HCC)    Diverticulitis    Gastric reflux    GERD (gastroesophageal reflux disease)    Hand pain 05/12/2016   Headache    H/O MIGRAINES   History of kidney stones    H/O   Lumbar radiculitis    Neck pain, chronic    Osteoarthritis of both knees    Panic attacks     SURGICAL HISTORY: Past Surgical History:  Procedure Laterality Date   BREAST BIOPSY Left 08/21/2021   Axilla Bx, Hydromarker, path pending   BREAST BIOPSY Left 08/21/2021   US Bx, Ribbon Clip, Path Pending   CARPAL TUNNEL RELEASE Right 12/06/2017   Procedure: CARPAL TUNNEL RELEASE;  Surgeon: Miller, Howard, MD;  Location: [facility] ORS;  Service: Orthopedics;  Laterality: Right;   COLONOSCOPY WITH PROPOFOL N/A 06/11/2021   Procedure: COLONOSCOPY WITH PROPOFOL;  Surgeon: Vanga, Rohini Reddy, MD;  Location: [facility] ENDOSCOPY;  Service: Gastroenterology;  Laterality: N/A;   DORSAL COMPARTMENT RELEASE Right 12/06/2017   Procedure: RELEASE DORSAL COMPARTMENT (DEQUERVAIN);  Surgeon: Miller, Howard, MD;  Location: [facility] ORS;  Service: Orthopedics;  Laterality: Right;   ESOPHAGOGASTRODUODENOSCOPY (EGD) WITH PROPOFOL N/A 06/11/2021   Procedure: ESOPHAGOGASTRODUODENOSCOPY (EGD) WITH PROPOFOL;  Surgeon: Vanga, Rohini Reddy, MD;  Location: [facility] ENDOSCOPY;  Service: Gastroenterology;   Laterality: N/A;   PARTIAL HYSTERECTOMY     PORTACATH PLACEMENT N/A 09/24/2021   Procedure: INSERTION PORT-A-CATH;  Surgeon: Cintron-Diaz, Edgardo, MD;  Location: [facility] ORS;  Service: General;  Laterality: N/A;   TUBAL LIGATION      SOCIAL HISTORY: Social History   Socioeconomic History   Marital status: Single    Spouse name: Not on file   Number of children: Not on file   Years of education: Not on file   Highest education level: Not on file  Occupational History   Not on file  Tobacco Use   Smoking status: Former    Types: Cigarettes   Smokeless tobacco: Never  Vaping Use   Vaping Use: Never used  Substance and Sexual Activity   Alcohol use: No   Drug use: No   Sexual activity: Not on file  Other Topics Concern   Not on file  Social History Narrative   Lives alone   Social Determinants of Health   Financial Resource Strain: High Risk (08/15/2021)   Overall Financial Resource Strain (CARDIA)    Difficulty of Paying Living Expenses: Very hard  Food Insecurity: Food Insecurity Present (09/09/2021)   Hunger Vital Sign      Worried About Running Out of Food in the Last Year: Often true    Ran Out of Food in the Last Year: Often true  Transportation Needs: No Transportation Needs (08/15/2021)   PRAPARE - Transportation    Lack of Transportation (Medical): No    Lack of Transportation (Non-Medical): No  Physical Activity: Inactive (08/15/2021)   Exercise Vital Sign    Days of Exercise per Week: 0 days    Minutes of Exercise per Session: 0 min  Stress: Stress Concern Present (08/15/2021)   [employer] - Occupational Stress Questionnaire    Feeling of Stress : Rather much  Social Connections: Socially Isolated (08/15/2021)   Social Connection and Isolation Panel [NHANES]    Frequency of Communication with Friends and Family: Once a week    Frequency of Social Gatherings with Friends and Family: Once a week    Attends Religious Services: Never     Active Member of Clubs or Organizations: No    Attends Club or Organization Meetings: Not on file    Marital Status: Never married  Intimate Partner Violence: Not At Risk (08/15/2021)   Humiliation, Afraid, Rape, and Kick questionnaire    Fear of Current or Ex-Partner: No    Emotionally Abused: No    Physically Abused: No    Sexually Abused: No    FAMILY HISTORY: Family History  Problem Relation Age of Onset   Diabetes Mother    Hypertension Mother    Heart failure Mother    Pancreatic cancer Mother 65   Diabetes Father    Hypertension Father    Breast cancer Maternal Aunt    Cervical cancer Maternal Aunt    Lung cancer Son 27    ALLERGIES:  is allergic to bc powder [aspirin-salicylamide-caffeine], gabapentin, hydrocodone-acetaminophen, latex, penicillin g, penicillins, and shrimp [shellfish allergy].  MEDICATIONS:  Current Outpatient Medications  Medication Sig Dispense Refill   albuterol (PROVENTIL) (2.5 MG/3ML) 0.083% nebulizer solution Take 3 mLs (2.5 mg total) by nebulization every 4 (four) hours as needed for wheezing or shortness of breath. 75 mL 2   albuterol (VENTOLIN HFA) 108 (90 Base) MCG/ACT inhaler Inhale 2 puffs into the lungs every 6 (six) hours as needed for wheezing or shortness of breath. 18 g 0   dexamethasone (DECADRON) 4 MG tablet Take 2 tablets (8 mg total) by mouth daily. Start the day before Taxotere. Then take daily x 2 days after chemotherapy. 30 tablet 1   dexamethasone (DECADRON) 4 MG tablet Take 1 tablet (4 mg total) by mouth 2 (two) times daily. 10 tablet 0   diphenoxylate-atropine (LOMOTIL) 2.5-0.025 MG tablet Take 1 tablet by mouth 4 (four) times daily. 60 tablet 1   FLOVENT DISKUS 250 MCG/ACT AEPB Inhale 1 puff into the lungs daily.     fluticasone (FLONASE) 50 MCG/ACT nasal spray Place 2 sprays into both nostrils daily. 15.8 mL 0   lidocaine-prilocaine (EMLA) cream Apply 1 Application topically as needed. 30 g 12   lisinopril-hydrochlorothiazide  (ZESTORETIC) 10-12.5 MG tablet Take 1 tablet by mouth daily.     loperamide (IMODIUM) 2 MG capsule Take 2 mg by mouth as needed for diarrhea or loose stools.     loratadine (CLARITIN) 10 MG tablet Take 1 tablet (10 mg total) by mouth daily. 30 tablet 2   magic mouthwash (multi-ingredient) oral suspension Swish and spit 5-10 ml by  mouth 4 times a day as needed 400 mL 1   montelukast (SINGULAIR) 10 MG tablet Take by mouth.       ondansetron (ZOFRAN) 8 MG tablet Take 1 tablet (8 mg total) by mouth 2 (two) times daily as needed (Nausea or vomiting). Start on the third day after chemotherapy. 30 tablet 1   oxyCODONE-acetaminophen (PERCOCET/ROXICET) 5-325 MG tablet Take 1 tablet by mouth every 6 (six) hours as needed for pain.     pantoprazole (PROTONIX) 40 MG tablet Take 40 mg by mouth daily.     traMADol (ULTRAM) 50 MG tablet Take 1 tablet (50 mg total) by mouth every 6 (six) hours as needed. 10 tablet 0   TRULICITY 3 MG/0.5ML SOPN Inject 0.75 mg into the skin once a week. Monday     apixaban (ELIQUIS) 2.5 MG TABS tablet Take 1 tablet (2.5 mg total) by mouth 2 (two) times daily. 60 tablet 0   magic mouthwash w/lidocaine SOLN Take 5 mLs by mouth 4 (four) times daily as needed for mouth pain. Sig: Swish/Swallow 5-10 ml four times a day as needed. Dispense 480 ml. 1RF (Patient not taking: Reported on 09/29/2021) 480 mL 1   naloxone (NARCAN) nasal spray 4 mg/0.1 mL  (Patient not taking: Reported on 09/29/2021)     prochlorperazine (COMPAZINE) 10 MG tablet Take 1 tablet (10 mg total) by mouth every 6 (six) hours as needed (Nausea or vomiting). (Patient not taking: Reported on 09/29/2021) 30 tablet 1   No current facility-administered medications for this visit.    Review of Systems  Constitutional:  Negative for appetite change, chills, fatigue and fever.  HENT:   Negative for hearing loss and voice change.   Eyes:  Negative for eye problems.  Respiratory:  Negative for chest tightness and cough.    Cardiovascular:  Negative for chest pain.  Gastrointestinal:  Negative for abdominal distention, abdominal pain and blood in stool.  Endocrine: Negative for hot flashes.  Genitourinary:  Negative for difficulty urinating and frequency.   Musculoskeletal:  Positive for arthralgias and back pain.  Skin:  Negative for itching and rash.  Neurological:  Negative for extremity weakness.  Hematological:  Negative for adenopathy.  Psychiatric/Behavioral:  Negative for confusion.   Left breast pain  PHYSICAL EXAMINATION: ECOG PERFORMANCE STATUS: 1 - Symptomatic but completely ambulatory Vitals:   10/07/21 1502  BP: (!) 127/58  Pulse: 89  Resp: 18  Temp: (!) 96.7 F (35.9 C)  SpO2: 100%   Filed Weights   10/07/21 1502  Weight: 216 lb 1.6 oz (98 kg)   Physical Exam Constitutional:      General: She is not in acute distress. HENT:     Head: Normocephalic and atraumatic.  Eyes:     General: No scleral icterus. Cardiovascular:     Rate and Rhythm: Normal rate and regular rhythm.     Heart sounds: Normal heart sounds.  Pulmonary:     Effort: Pulmonary effort is normal. No respiratory distress.     Breath sounds: No wheezing.  Abdominal:     General: Bowel sounds are normal. There is no distension.     Palpations: Abdomen is soft.  Musculoskeletal:        General: No deformity. Normal range of motion.     Cervical back: Normal range of motion and neck supple.  Skin:    General: Skin is warm and dry.     Findings: No erythema or rash.  Neurological:     Mental Status: She is alert and oriented to person, place, and time. Mental status is at baseline.     Cranial Nerves: No cranial nerve deficit.       Coordination: Coordination normal.  Psychiatric:        Mood and Affect: Mood normal.   Gross examination reviewed palpable fixed left breast firm mass, no nipple discharge,slightly retracted nipple with focal skin edema [peau d'orange].   No appreciable mass in the right breast.   No appreciable lymphadenopathy in the axilla bilaterally. 09/12/21    LABORATORY DATA:  I have reviewed the data as listed Lab Results  Component Value Date   WBC 7.8 10/07/2021   HGB 10.5 (L) 10/07/2021   HCT 33.3 (L) 10/07/2021   MCV 82.8 10/07/2021   PLT 376 10/07/2021   Lab Results  Component Value Date   NA 140 10/07/2021   K 3.5 10/07/2021   CL 102 10/07/2021   CO2 27 10/07/2021    RADIOGRAPHIC STUDIES: I have personally reviewed the radiological images as listed and agreed with the findings in the report. DG Chest Port 1 View  Result Date: 09/24/2021 CLINICAL DATA:  Port placement EXAM: PORTABLE CHEST 1 VIEW COMPARISON:  10/09/2020 FINDINGS: Interval placement of right IJ approach chest port with distal tip terminating at the level of the distal SVC. Heart size within normal limits. No focal airspace consolidation, pleural effusion, or pneumothorax. IMPRESSION: Satisfactory placement of right IJ approach chest port. No pneumothorax. Electronically Signed   By: Nicholas  Plundo D.O.   On: 09/24/2021 15:41   DG C-Arm 1-60 Min-No Report  Result Date: 09/24/2021 Fluoroscopy was utilized by the requesting physician.  No radiographic interpretation.   US Venous Img Upper Uni Left  Result Date: 09/22/2021 CLINICAL DATA:  Edema at injection site left wrist EXAM: Left UPPER EXTREMITY VENOUS DOPPLER ULTRASOUND TECHNIQUE: Gray-scale sonography with graded compression, as well as color Doppler and duplex ultrasound were performed to evaluate the upper extremity deep venous system from the level of the subclavian vein and including the jugular, axillary, basilic, radial, ulnar and upper cephalic vein. Spectral Doppler was utilized to evaluate flow at rest and with distal augmentation maneuvers. COMPARISON:  None Available. FINDINGS: Contralateral Subclavian Vein: Respiratory phasicity is normal and symmetric with the symptomatic side. No evidence of thrombus. Normal compressibility.  Internal Jugular Vein: No evidence of thrombus. Normal compressibility, respiratory phasicity and response to augmentation. Subclavian Vein: No evidence of thrombus. Normal compressibility, respiratory phasicity and response to augmentation. Axillary Vein: No evidence of thrombus. Normal compressibility, respiratory phasicity and response to augmentation. Cephalic Vein: Positive for occlusive thrombus within the distal cephalic vein from the wrist to the elbow. Noncompressible. Basilic Vein: Positive for nonocclusive thrombus within the proximal to mid basilic vein. Noncompressible. Brachial Veins: No evidence of thrombus.  Normal compressibility, Radial Veins: No evidence of thrombus.  Normal compressibility,. Ulnar Veins: No evidence of thrombus.  Normal compressibility, IMPRESSION: 1. Negative for acute DVT within the left upper extremity. 2. Positive for acute thrombus/superficial thrombophlebitis of the left cephalic and basilic veins. These results will be called to the ordering clinician or representative by the Radiologist Assistant, and communication documented in the PACS or [facility] Dashboard. Electronically Signed   By: Kim  Fujinaga M.D.   On: 09/22/2021 16:23   NM PET Image Initial (PI) Skull Base To Thigh  Result Date: 09/10/2021 CLINICAL DATA:  Initial treatment strategy for left breast carcinoma. EXAM: NUCLEAR MEDICINE PET SKULL BASE TO THIGH TECHNIQUE: 12.0 mCi F-18 FDG was injected intravenously. Full-ring PET imaging was performed from the skull base to thigh after the radiotracer. CT data was obtained and used for attenuation correction and anatomic localization. Fasting blood glucose:   101 mg/dl COMPARISON:  AP CT on 04/17/2021 FINDINGS: Mediastinal blood-pool activity (background): SUV max = 2.0 Liver activity (reference): SUV max = N/A NECK:  No hypermetabolic lymph nodes or masses. Incidental CT findings:  None. CHEST: Large multilobular mass and diffuse skin thickening is seen in the left  breast. This has SUV max 14.9 consistent primary breast carcinoma. Hypermetabolic lymphadenopathy is seen in the left axilla measuring up to 3.1 x 2.5 cm with SUV max of 12.2. A few sub-cm left subpectoral lymph nodes are also seen with SUV max of 2.8. A few sub-cm left supraclavicular lymph nodes are also seen which has a SUV max of 3.3. No hypermetabolic mediastinal or hilar lymph nodes identified. No suspicious pulmonary nodules or masses seen on CT images. No evidence of pulmonary infiltrate or pleural effusion. Incidental CT findings:  None. ABDOMEN/PELVIS: No abnormal hypermetabolic activity within the liver, pancreas, adrenal glands, or spleen. No hypermetabolic lymph nodes in the abdomen or pelvis. Incidental CT findings:  None. SKELETON: No focal hypermetabolic bone lesions to suggest skeletal metastasis. Incidental CT findings:  None. IMPRESSION: Large hypermetabolic left breast mass and diffuse skin thickening, consistent with primary breast carcinoma. Hypermetabolic lymphadenopathy in the left axilla, left subpectoral region, and left supraclavicular region, consistent with metastatic disease. No evidence of metastatic disease within the abdomen or pelvis. Electronically Signed   By: Marlaine Hind M.D.   On: 09/10/2021 14:41

## 2021-10-07 NOTE — Assessment & Plan Note (Signed)
hemoglobin has decreased as expected.  Monitor counts.  

## 2021-10-07 NOTE — Assessment & Plan Note (Signed)
Improved, still some residual swelling Continue Eliquis 2.'5mg'$  BID

## 2021-10-07 NOTE — Assessment & Plan Note (Signed)
Continue lomotil PRN

## 2021-10-07 NOTE — Assessment & Plan Note (Signed)
Treatment plan as listed above. 

## 2021-10-08 ENCOUNTER — Ambulatory Visit: Payer: Medicare HMO

## 2021-10-08 ENCOUNTER — Ambulatory Visit: Payer: Medicare HMO | Admitting: Oncology

## 2021-10-08 ENCOUNTER — Other Ambulatory Visit: Payer: Medicare HMO

## 2021-10-08 ENCOUNTER — Inpatient Hospital Stay: Payer: Medicare HMO

## 2021-10-08 DIAGNOSIS — C50812 Malignant neoplasm of overlapping sites of left female breast: Secondary | ICD-10-CM

## 2021-10-08 DIAGNOSIS — Z5111 Encounter for antineoplastic chemotherapy: Secondary | ICD-10-CM | POA: Diagnosis not present

## 2021-10-08 DIAGNOSIS — I808 Phlebitis and thrombophlebitis of other sites: Secondary | ICD-10-CM | POA: Diagnosis not present

## 2021-10-08 DIAGNOSIS — D6481 Anemia due to antineoplastic chemotherapy: Secondary | ICD-10-CM | POA: Diagnosis not present

## 2021-10-08 DIAGNOSIS — Z171 Estrogen receptor negative status [ER-]: Secondary | ICD-10-CM | POA: Diagnosis not present

## 2021-10-08 DIAGNOSIS — K521 Toxic gastroenteritis and colitis: Secondary | ICD-10-CM | POA: Diagnosis not present

## 2021-10-08 DIAGNOSIS — Z5112 Encounter for antineoplastic immunotherapy: Secondary | ICD-10-CM | POA: Diagnosis not present

## 2021-10-08 DIAGNOSIS — E860 Dehydration: Secondary | ICD-10-CM | POA: Diagnosis not present

## 2021-10-08 MED ORDER — MONTELUKAST SODIUM 10 MG PO TABS
10.0000 mg | ORAL_TABLET | Freq: Once | ORAL | Status: AC
  Administered 2021-10-08: 10 mg via ORAL
  Filled 2021-10-08: qty 1

## 2021-10-08 MED ORDER — SODIUM CHLORIDE 0.9 % IV SOLN
150.0000 mg | Freq: Once | INTRAVENOUS | Status: AC
  Administered 2021-10-08: 150 mg via INTRAVENOUS
  Filled 2021-10-08: qty 150

## 2021-10-08 MED ORDER — SODIUM CHLORIDE 0.9 % IV SOLN
Freq: Once | INTRAVENOUS | Status: AC
  Filled 2021-10-08: qty 250

## 2021-10-08 MED ORDER — HEPARIN SOD (PORK) LOCK FLUSH 100 UNIT/ML IV SOLN
500.0000 [IU] | Freq: Once | INTRAVENOUS | Status: AC | PRN
  Administered 2021-10-08: 500 [IU]
  Filled 2021-10-08: qty 5

## 2021-10-08 MED ORDER — ACETAMINOPHEN 325 MG PO TABS
650.0000 mg | ORAL_TABLET | Freq: Once | ORAL | Status: AC
  Administered 2021-10-08: 650 mg via ORAL
  Filled 2021-10-08: qty 2

## 2021-10-08 MED ORDER — SODIUM CHLORIDE 0.9 % IV SOLN
10.0000 mg | Freq: Once | INTRAVENOUS | Status: AC
  Administered 2021-10-08: 10 mg via INTRAVENOUS
  Filled 2021-10-08: qty 10

## 2021-10-08 MED ORDER — SODIUM CHLORIDE 0.9 % IV SOLN
850.0000 mg | Freq: Once | INTRAVENOUS | Status: AC
  Administered 2021-10-08: 850 mg via INTRAVENOUS
  Filled 2021-10-08: qty 85

## 2021-10-08 MED ORDER — DIPHENHYDRAMINE HCL 25 MG PO CAPS
50.0000 mg | ORAL_CAPSULE | Freq: Once | ORAL | Status: AC
  Administered 2021-10-08: 50 mg via ORAL
  Filled 2021-10-08: qty 2

## 2021-10-08 MED ORDER — SODIUM CHLORIDE 0.9 % IV SOLN
75.0000 mg/m2 | Freq: Once | INTRAVENOUS | Status: AC
  Administered 2021-10-08: 160 mg via INTRAVENOUS
  Filled 2021-10-08: qty 16

## 2021-10-08 MED ORDER — TRASTUZUMAB-ANNS CHEMO 150 MG IV SOLR
6.0000 mg/kg | Freq: Once | INTRAVENOUS | Status: AC
  Administered 2021-10-08: 588 mg via INTRAVENOUS
  Filled 2021-10-08: qty 28

## 2021-10-08 MED ORDER — SODIUM CHLORIDE 0.9 % IV SOLN
420.0000 mg | Freq: Once | INTRAVENOUS | Status: AC
  Administered 2021-10-08: 420 mg via INTRAVENOUS
  Filled 2021-10-08: qty 14

## 2021-10-08 MED ORDER — PALONOSETRON HCL INJECTION 0.25 MG/5ML
0.2500 mg | Freq: Once | INTRAVENOUS | Status: AC
  Administered 2021-10-08: 0.25 mg via INTRAVENOUS
  Filled 2021-10-08: qty 5

## 2021-10-08 NOTE — Progress Notes (Signed)
Nutrition Follow-up:  Patient with left breast cancer.  Patient receiving chemotherapy.  Met with patient during infusion.  Reports that appetite has been good due to being on steroids.  She is now off of steroids.  Says that she is eating fruits, meats, vegetables, jello, peanut butter crackers, celery and peanut butter.  Tries not to eat past 8 pm.  Drinking protein shake.   Patient still having diarrhea.       Medications: reviewed  Labs: reviewed  Anthropometrics:   Weight 216 lb 1.6 oz, stable  240 lb on 3/16   NUTRITION DIAGNOSIS: Inadequate oral intake improved    INTERVENTION:  Discussed foods to choose with diarrhea. Handout provided    MONITORING, EVALUATION, GOAL: weight trends, intake   NEXT VISIT: Wednesday, October 18th in infusion  Shandon Burlingame B. Zenia Resides, [place], [place] Registered Dietitian [phone]

## 2021-10-08 NOTE — Patient Instructions (Addendum)
[facility] CANCER CTR AT [place]  Discharge Instructions: Thank you for choosing [facility] to provide your oncology and hematology care.  If you have a lab appointment with the [place], please go directly to the [facility] [place] and check in at the registration area.  Wear comfortable clothing and clothing appropriate for easy access to any Portacath or PICC line.   We strive to give you quality time with your provider. You may need to reschedule your appointment if you arrive late (15 or more minutes).  Arriving late affects you and other patients whose appointments are after yours.  Also, if you miss three or more appointments without notifying the office, you may be dismissed from the clinic at the provider's discretion.      For prescription refill requests, have your pharmacy contact our office and allow 72 hours for refills to be completed.    Today you received the following chemotherapy and/or immunotherapy agents Perjeta, Kanjinti, Taxotere, Carboplatin     To help prevent nausea and vomiting after your treatment, we encourage you to take your nausea medication as directed.  BELOW ARE SYMPTOMS THAT SHOULD BE REPORTED IMMEDIATELY: *FEVER GREATER THAN 100.4 F (38 C) OR HIGHER *CHILLS OR SWEATING *NAUSEA AND VOMITING THAT IS NOT CONTROLLED WITH YOUR NAUSEA MEDICATION *UNUSUAL SHORTNESS OF BREATH *UNUSUAL BRUISING OR BLEEDING *URINARY PROBLEMS (pain or burning when urinating, or frequent urination) *BOWEL PROBLEMS (unusual diarrhea, constipation, pain near the anus) TENDERNESS IN MOUTH AND THROAT WITH OR WITHOUT PRESENCE OF ULCERS (sore throat, sores in mouth, or a toothache) UNUSUAL RASH, SWELLING OR PAIN  UNUSUAL VAGINAL DISCHARGE OR ITCHING   Items with * indicate a potential emergency and should be followed up as soon as possible or go to the Emergency Department if any problems should occur.  Please show the CHEMOTHERAPY ALERT CARD or  IMMUNOTHERAPY ALERT CARD at check-in to the Emergency Department and triage nurse.  Should you have questions after your visit or need to cancel or reschedule your appointment, please contact [facility] CANCER [place] AT [place]  [phone] and follow the prompts.  Office hours are 8:00 a.m. to 4:30 p.m. Monday - Friday. Please note that voicemails left after 4:00 p.m. may not be returned until the following business day.  We are closed weekends and major holidays. You have access to a nurse at all times for urgent questions. Please call the main number to the clinic [phone] and follow the prompts.  For any non-urgent questions, you may also contact your provider using MyChart. We now offer e-Visits for anyone 29 and older to request care online for non-urgent symptoms. For details visit [email or web address].   Also download the MyChart app! Go to the app store, search "MyChart", open the app, select [facility], and log in with your MyChart username and password.  Masks are optional in the cancer centers. If you would like for your care team to wear a mask while they are taking care of you, please let them know. For doctor visits, patients may have with them one support person who is at least 59 years old. At this time, visitors are not allowed in the infusion area.

## 2021-10-10 ENCOUNTER — Inpatient Hospital Stay: Payer: Medicare HMO

## 2021-10-10 DIAGNOSIS — D6481 Anemia due to antineoplastic chemotherapy: Secondary | ICD-10-CM | POA: Diagnosis not present

## 2021-10-10 DIAGNOSIS — E860 Dehydration: Secondary | ICD-10-CM | POA: Diagnosis not present

## 2021-10-10 DIAGNOSIS — Z5111 Encounter for antineoplastic chemotherapy: Secondary | ICD-10-CM | POA: Diagnosis not present

## 2021-10-10 DIAGNOSIS — C50812 Malignant neoplasm of overlapping sites of left female breast: Secondary | ICD-10-CM

## 2021-10-10 DIAGNOSIS — Z171 Estrogen receptor negative status [ER-]: Secondary | ICD-10-CM | POA: Diagnosis not present

## 2021-10-10 DIAGNOSIS — I808 Phlebitis and thrombophlebitis of other sites: Secondary | ICD-10-CM | POA: Diagnosis not present

## 2021-10-10 DIAGNOSIS — K521 Toxic gastroenteritis and colitis: Secondary | ICD-10-CM | POA: Diagnosis not present

## 2021-10-10 DIAGNOSIS — Z5112 Encounter for antineoplastic immunotherapy: Secondary | ICD-10-CM | POA: Diagnosis not present

## 2021-10-10 MED ORDER — PEGFILGRASTIM-CBQV 6 MG/0.6ML ~~LOC~~ SOSY
6.0000 mg | PREFILLED_SYRINGE | Freq: Once | SUBCUTANEOUS | Status: AC
  Administered 2021-10-10: 6 mg via SUBCUTANEOUS
  Filled 2021-10-10: qty 0.6

## 2021-10-11 DIAGNOSIS — Z6833 Body mass index (BMI) 33.0-33.9, adult: Secondary | ICD-10-CM | POA: Diagnosis not present

## 2021-10-11 DIAGNOSIS — Z6834 Body mass index (BMI) 34.0-34.9, adult: Secondary | ICD-10-CM | POA: Diagnosis not present

## 2021-10-11 DIAGNOSIS — Z9181 History of falling: Secondary | ICD-10-CM | POA: Diagnosis not present

## 2021-10-11 DIAGNOSIS — Z6839 Body mass index (BMI) 39.0-39.9, adult: Secondary | ICD-10-CM | POA: Diagnosis not present

## 2021-10-11 DIAGNOSIS — G8929 Other chronic pain: Secondary | ICD-10-CM | POA: Diagnosis not present

## 2021-10-11 DIAGNOSIS — M545 Low back pain, unspecified: Secondary | ICD-10-CM | POA: Diagnosis not present

## 2021-10-13 ENCOUNTER — Encounter: Payer: Self-pay | Admitting: Medical Oncology

## 2021-10-13 ENCOUNTER — Inpatient Hospital Stay: Payer: Medicare HMO

## 2021-10-13 ENCOUNTER — Encounter: Payer: Self-pay | Admitting: *Deleted

## 2021-10-13 ENCOUNTER — Inpatient Hospital Stay (HOSPITAL_BASED_OUTPATIENT_CLINIC_OR_DEPARTMENT_OTHER): Payer: Medicare HMO | Admitting: Medical Oncology

## 2021-10-13 ENCOUNTER — Encounter: Payer: Self-pay | Admitting: Licensed Clinical Social Worker

## 2021-10-13 ENCOUNTER — Other Ambulatory Visit: Payer: Self-pay

## 2021-10-13 VITALS — BP 116/72 | HR 102 | Temp 97.5°F

## 2021-10-13 VITALS — BP 116/72 | HR 102 | Temp 97.5°F | Resp 18

## 2021-10-13 DIAGNOSIS — Z171 Estrogen receptor negative status [ER-]: Secondary | ICD-10-CM | POA: Diagnosis not present

## 2021-10-13 DIAGNOSIS — T451X5A Adverse effect of antineoplastic and immunosuppressive drugs, initial encounter: Secondary | ICD-10-CM

## 2021-10-13 DIAGNOSIS — E860 Dehydration: Secondary | ICD-10-CM

## 2021-10-13 DIAGNOSIS — D6481 Anemia due to antineoplastic chemotherapy: Secondary | ICD-10-CM | POA: Diagnosis not present

## 2021-10-13 DIAGNOSIS — Z95828 Presence of other vascular implants and grafts: Secondary | ICD-10-CM

## 2021-10-13 DIAGNOSIS — Z5112 Encounter for antineoplastic immunotherapy: Secondary | ICD-10-CM | POA: Diagnosis not present

## 2021-10-13 DIAGNOSIS — C50812 Malignant neoplasm of overlapping sites of left female breast: Secondary | ICD-10-CM

## 2021-10-13 DIAGNOSIS — R310 Gross hematuria: Secondary | ICD-10-CM | POA: Diagnosis not present

## 2021-10-13 DIAGNOSIS — Z5111 Encounter for antineoplastic chemotherapy: Secondary | ICD-10-CM | POA: Diagnosis not present

## 2021-10-13 DIAGNOSIS — R197 Diarrhea, unspecified: Secondary | ICD-10-CM

## 2021-10-13 DIAGNOSIS — K521 Toxic gastroenteritis and colitis: Secondary | ICD-10-CM

## 2021-10-13 DIAGNOSIS — I808 Phlebitis and thrombophlebitis of other sites: Secondary | ICD-10-CM | POA: Diagnosis not present

## 2021-10-13 DIAGNOSIS — R319 Hematuria, unspecified: Secondary | ICD-10-CM

## 2021-10-13 DIAGNOSIS — R110 Nausea: Secondary | ICD-10-CM

## 2021-10-13 LAB — CBC WITH DIFFERENTIAL/PLATELET
Abs Immature Granulocytes: 0.4 10*3/uL — ABNORMAL HIGH (ref 0.00–0.07)
Band Neutrophils: 2 %
Basophils Absolute: 0 10*3/uL (ref 0.0–0.1)
Basophils Relative: 0 %
Eosinophils Absolute: 0 10*3/uL (ref 0.0–0.5)
Eosinophils Relative: 0 %
HCT: 37.2 % (ref 36.0–46.0)
Hemoglobin: 11.9 g/dL — ABNORMAL LOW (ref 12.0–15.0)
Lymphocytes Relative: 3 %
Lymphs Abs: 0.3 10*3/uL — ABNORMAL LOW (ref 0.7–4.0)
MCH: 26.4 pg (ref 26.0–34.0)
MCHC: 32 g/dL (ref 30.0–36.0)
MCV: 82.7 fL (ref 80.0–100.0)
Metamyelocytes Relative: 2 %
Monocytes Absolute: 0.2 10*3/uL (ref 0.1–1.0)
Monocytes Relative: 2 %
Myelocytes: 2 %
Neutro Abs: 8.7 10*3/uL — ABNORMAL HIGH (ref 1.7–7.7)
Neutrophils Relative %: 89 %
Platelets: 223 10*3/uL (ref 150–400)
RBC: 4.5 MIL/uL (ref 3.87–5.11)
RDW: 15.5 % (ref 11.5–15.5)
Smear Review: NORMAL
WBC: 9.6 10*3/uL (ref 4.0–10.5)
nRBC: 0 % (ref 0.0–0.2)

## 2021-10-13 LAB — COMPREHENSIVE METABOLIC PANEL
ALT: 20 U/L (ref 0–44)
AST: 23 U/L (ref 15–41)
Albumin: 3.8 g/dL (ref 3.5–5.0)
Alkaline Phosphatase: 117 U/L (ref 38–126)
Anion gap: 8 (ref 5–15)
BUN: 14 mg/dL (ref 6–20)
CO2: 28 mmol/L (ref 22–32)
Calcium: 9.2 mg/dL (ref 8.9–10.3)
Chloride: 100 mmol/L (ref 98–111)
Creatinine, Ser: 0.68 mg/dL (ref 0.44–1.00)
GFR, Estimated: >60 mL/min (ref >60)
Glucose, Bld: 121 mg/dL — ABNORMAL HIGH (ref 70–99)
Potassium: 3.7 mmol/L (ref 3.5–5.1)
Sodium: 136 mmol/L (ref 135–145)
Total Bilirubin: 0.7 mg/dL (ref 0.3–1.2)
Total Protein: 7.2 g/dL (ref 6.5–8.1)

## 2021-10-13 LAB — URINALYSIS, COMPLETE (UACMP) WITH MICROSCOPIC
Bacteria, UA: NONE SEEN
Bilirubin Urine: NEGATIVE
Glucose, UA: NEGATIVE mg/dL
Ketones, ur: NEGATIVE mg/dL
Nitrite: NEGATIVE
Protein, ur: 30 mg/dL — AB
RBC / HPF: >50 RBC/hpf — ABNORMAL HIGH (ref 0–5)
Specific Gravity, Urine: 1.021 (ref 1.005–1.030)
pH: 6 (ref 5.0–8.0)

## 2021-10-13 LAB — MAGNESIUM: Magnesium: 1.9 mg/dL (ref 1.7–2.4)

## 2021-10-13 MED ORDER — SODIUM CHLORIDE 0.9% FLUSH
10.0000 mL | Freq: Once | INTRAVENOUS | Status: AC
  Administered 2021-10-13: 10 mL via INTRAVENOUS
  Filled 2021-10-13: qty 10

## 2021-10-13 MED ORDER — SULFAMETHOXAZOLE-TRIMETHOPRIM 800-160 MG PO TABS: 1.0000 | ORAL_TABLET | Freq: Two times a day (BID) | ORAL | 0 refills | Status: AC

## 2021-10-13 MED ORDER — SODIUM CHLORIDE 0.9 % IV SOLN
INTRAVENOUS | Status: DC
  Filled 2021-10-13 (×2): qty 250

## 2021-10-13 MED ORDER — HEPARIN SOD (PORK) LOCK FLUSH 100 UNIT/ML IV SOLN
500.0000 [IU] | Freq: Once | INTRAVENOUS | Status: AC
  Administered 2021-10-13: 500 [IU] via INTRAVENOUS
  Filled 2021-10-13: qty 5

## 2021-10-13 NOTE — Progress Notes (Addendum)
Symptom Management [place] at [facility] [phone] [phone]  Patient Care Team: [place] as PCP - General Daiva Huge, RN as Oncology Nurse Navigator   Name of the patient: Vanessa Fisher  [id number]  [birth year]   Date of visit: 10/13/21  Reason for Consult: Vanessa Fisher is a 61 y.o. 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 Her last TCHP chemotherapy was on 10/10/2021. She reports that it is not abnormal for her to have diarrhea after her chemotherapy for a few days but this time it has been more than normal. Diarrhea is watery in color. Also having some nausea, belching, urinary frequency, hematuria. She has tried lomotil, imodium, crackers, Gatorade, water for symptoms with relief. Gets UTIs somewhat frequently- is on trulicity- last UTI was over 3 months ago. No known significant ABX resistance.   Denies any neurologic complaints. Denies recent fevers or illnesses. Denies any easy bleeding or bruising. Reports fair appetite and denies weight loss. Denies chest pain. Patient offers no further specific complaints today.    PAST MEDICAL HISTORY: Past Medical History:  Diagnosis Date   Anemia    Anxiety    Arthritis    Asthma    WELL CONTROLLED   Bile acid malabsorption syndrome    Bradycardia    HAD AN ISSUE WITH THIS IN 2016-NO PROBLEMS SINCE   Breast cancer, left breast ([place]) 08/2021   Carpal tunnel syndrome on right    Depression    Diabetes mellitus without complication (HCC)    Diverticulitis    Gastric reflux    GERD (gastroesophageal reflux disease)    Hand pain 05/12/2016   Headache    H/O MIGRAINES   History of kidney stones    H/O   Lumbar radiculitis    Neck pain, chronic    Osteoarthritis of both knees    Panic attacks      PAST SURGICAL HISTORY:  Past Surgical History:  Procedure Laterality Date   BREAST BIOPSY Left 08/21/2021   Axilla Bx, Hydromarker, path pending   BREAST BIOPSY Left 08/21/2021   [place] Bx, Ribbon Clip, Path Pending   CARPAL TUNNEL RELEASE Right 12/06/2017   Procedure: CARPAL TUNNEL RELEASE;  Surgeon: Earnestine Leys, MD;  Location: [facility] ORS;  Service: Orthopedics;  Laterality: Right;   COLONOSCOPY WITH PROPOFOL N/A 06/11/2021   Procedure: COLONOSCOPY WITH PROPOFOL;  Surgeon: Lin Landsman, MD;  Location: [facility] ENDOSCOPY;  Service: Gastroenterology;  Laterality: N/A;   DORSAL COMPARTMENT RELEASE Right 12/06/2017   Procedure: RELEASE DORSAL COMPARTMENT (DEQUERVAIN);  Surgeon: Earnestine Leys, MD;  Location: [facility] ORS;  Service: Orthopedics;  Laterality: Right;   ESOPHAGOGASTRODUODENOSCOPY (EGD) WITH PROPOFOL N/A 06/11/2021   Procedure: ESOPHAGOGASTRODUODENOSCOPY (EGD) WITH PROPOFOL;  Surgeon: Lin Landsman, MD;  Location: [facility] ENDOSCOPY;  Service: Gastroenterology;  Laterality: N/A;   PARTIAL HYSTERECTOMY     PORTACATH PLACEMENT N/A 09/24/2021   Procedure: INSERTION PORT-A-CATH;  Surgeon: Herbert Pun, MD;  Location: [facility] ORS;  Service: General;  Laterality: N/A;   TUBAL LIGATION      HEMATOLOGY/ONCOLOGY HISTORY:  Oncology History  Breast cancer ([place])  07/31/2021 Imaging   Bilateral diagnostic mammogram and US showed 5 centimeter LEFT breast mass associated with pleomorphic calcifications is suspicious for invasive ductal carcinoma.At least 4 LEFT axillary lymph nodes with abnormal morphology   08/21/2021 Cancer Staging  Staging form: Breast, AJCC 8th Edition - Clinical: Stage IIIB (cT4b, cN3c, cM0, G3, ER-, PR-, HER2+) - Signed by Earlie Server, MD on 08/28/2021 Histologic grading system: 3 grade system  -08/21/21 left breast ultrasound-guided biopsy showed invasive mammary carcinoma, grade 3, ER/PR negative, HER2 positive.  Left axillary lymph node biopsy positive for  macro metastatic mammary carcinoma, 8 mm in greatest extent.   08/30/2021 Imaging   MRI brain w wo contrast -No metastatic disease or acute intracranial abnormality. Essentially normal for age MRI appearance of the brain.    09/03/2021 Echocardiogram   1. Left ventricular ejection fraction, by estimation, is 55 to 60%. Left ventricular ejection fraction by 3D volume is 58 %. The left ventricle has normal function. The left ventricle has no regional wall motion abnormalities. There is mild left  ventricular hypertrophy. Left ventricular diastolic parameters were normal.  2. Right ventricular systolic function is normal. The right ventricular size is normal.  3. The mitral valve is normal in structure. No evidence of mitral valve regurgitation.  4. The aortic valve was not well visualized. Aortic valve regurgitation is not visualized   09/10/2021 Imaging   PET scan showed Large hypermetabolic left breast mass and diffuse skin thickening,consistent with primary breast carcinoma. Hypermetabolic lymphadenopathy in the left axilla, left subpectoral region, and left supraclavicular region, consistent with metastatic disease. No evidence of metastatic disease within the abdomen or pelvis   09/12/2021 -  Chemotherapy   Patient is on Treatment Plan : BREAST  Docetaxel + Carboplatin + Trastuzumab + Pertuzumab  (TCHP) q21d        ALLERGIES:  is allergic to bc powder [aspirin-salicylamide-caffeine], gabapentin, hydrocodone-acetaminophen, latex, penicillin g, penicillins, and shrimp [shellfish allergy].  MEDICATIONS:  Current Outpatient Medications  Medication Sig Dispense Refill   albuterol (PROVENTIL) (2.5 MG/3ML) 0.083% nebulizer solution Take 3 mLs (2.5 mg total) by nebulization every 4 (four) hours as needed for wheezing or shortness of breath. 75 mL 2   albuterol (VENTOLIN HFA) 108 (90 Base) MCG/ACT inhaler Inhale 2 puffs into the lungs every 6 (six) hours as needed for wheezing or shortness of  breath. 18 g 0   apixaban (ELIQUIS) 2.5 MG TABS tablet Take 1 tablet (2.5 mg total) by mouth 2 (two) times daily. 60 tablet 0   dexamethasone (DECADRON) 4 MG tablet Take 2 tablets (8 mg total) by mouth daily. Start the day before Taxotere. Then take daily x 2 days after chemotherapy. 30 tablet 1   dexamethasone (DECADRON) 4 MG tablet Take 1 tablet (4 mg total) by mouth 2 (two) times daily. 10 tablet 0   diphenoxylate-atropine (LOMOTIL) 2.5-0.025 MG tablet Take 1 tablet by mouth 4 (four) times daily. 60 tablet 1   FLOVENT DISKUS 250 MCG/ACT AEPB Inhale 1 puff into the lungs daily.     fluticasone (FLONASE) 50 MCG/ACT nasal spray Place 2 sprays into both nostrils daily. 15.8 mL 0   lidocaine-prilocaine (EMLA) cream Apply 1 Application topically as needed. 30 g 12   lisinopril-hydrochlorothiazide (ZESTORETIC) 10-12.5 MG tablet Take 1 tablet by mouth daily.     loperamide (IMODIUM) 2 MG capsule Take 2 mg by mouth as needed for diarrhea or loose stools.     loratadine (CLARITIN) 10 MG tablet Take 1 tablet (10 mg total) by mouth daily. 30 tablet 2   magic mouthwash (multi-ingredient) oral suspension Swish and spit 5-10 ml by  mouth 4 times a day as needed 400 mL 1   magic mouthwash w/lidocaine SOLN Take 5 mLs by  mouth 4 (four) times daily as needed for mouth pain. Sig: Swish/Swallow 5-10 ml four times a day as needed. Dispense 480 ml. 1RF 480 mL 1   montelukast (SINGULAIR) 10 MG tablet Take by mouth.     ondansetron (ZOFRAN) 8 MG tablet Take 1 tablet (8 mg total) by mouth 2 (two) times daily as needed (Nausea or vomiting). Start on the third day after chemotherapy. 30 tablet 1   oxyCODONE-acetaminophen (PERCOCET/ROXICET) 5-325 MG tablet Take 1 tablet by mouth every 6 (six) hours as needed for pain.     pantoprazole (PROTONIX) 40 MG tablet Take 40 mg by mouth daily.     prochlorperazine (COMPAZINE) 10 MG tablet Take 1 tablet (10 mg total) by mouth every 6 (six) hours as needed (Nausea or vomiting). 30  tablet 1   traMADol (ULTRAM) 50 MG tablet Take 1 tablet (50 mg total) by mouth every 6 (six) hours as needed. 10 tablet 0   TRULICITY 3 VF/6.4PP SOPN Inject 0.75 mg into the skin once a week. Monday     naloxone (NARCAN) nasal spray 4 mg/0.1 mL  (Patient not taking: Reported on 10/13/2021)     No current facility-administered medications for this visit.   Facility-Administered Medications Ordered in Other Visits  Medication Dose Route Frequency Provider Last Rate Last Admin   0.9 %  sodium chloride infusion   Intravenous Continuous Hughie Closs, [place] 999 mL/hr at 10/13/21 1058 New Bag at 10/13/21 1058    VITAL SIGNS: BP 116/72   Pulse (!) 102   Temp (!) 97.5 F (36.4 C) (Tympanic)   Resp 18  There were no vitals filed for this visit.  Estimated body mass index is 34.88 kg/m as calculated from the following:   Height as of 09/29/21: '5\' 6"'  (1.676 m).   Weight as of 10/07/21: 216 lb 1.6 oz (98 kg).  LABS: CBC:    Component Value Date/Time   WBC 7.8 10/07/2021 1414   HGB 10.5 (L) 10/07/2021 1414   HGB 11.9 (L) 05/27/2014 2215   HCT 33.3 (L) 10/07/2021 1414   HCT 36.4 05/27/2014 2215   PLT 376 10/07/2021 1414   PLT 293 05/27/2014 2215   MCV 82.8 10/07/2021 1414   MCV 81 05/27/2014 2215   NEUTROABS 6.6 10/07/2021 1414   NEUTROABS 5.4 05/27/2014 2215   LYMPHSABS 1.0 10/07/2021 1414   LYMPHSABS 3.0 05/27/2014 2215   MONOABS 0.2 10/07/2021 1414   MONOABS 0.6 05/27/2014 2215   EOSABS 0.0 10/07/2021 1414   EOSABS 0.0 05/27/2014 2215   BASOSABS 0.0 10/07/2021 1414   BASOSABS 0.0 05/27/2014 2215   Comprehensive Metabolic Panel:    Component Value Date/Time   NA 140 10/07/2021 1414   NA 143 07/08/2016 1259   NA 139 05/27/2014 2215   K 3.5 10/07/2021 1414   K 3.5 05/27/2014 2215   CL 102 10/07/2021 1414   CL 105 05/27/2014 2215   CO2 27 10/07/2021 1414   CO2 26 05/27/2014 2215   BUN 13 10/07/2021 1414   BUN 10 07/08/2016 1259   BUN 14 05/27/2014 2215   CREATININE  0.84 10/07/2021 1414   CREATININE 1.09 (H) 05/27/2014 2215   GLUCOSE 141 (H) 10/07/2021 1414   GLUCOSE 104 (H) 05/27/2014 2215   CALCIUM 9.0 10/07/2021 1414   CALCIUM 9.0 05/27/2014 2215   AST 21 10/07/2021 1414   AST < 5 (L) 08/05/2013 0832   ALT 20 10/07/2021 1414   ALT 24 08/05/2013 0832   ALKPHOS 91 10/07/2021  1414   ALKPHOS 97 08/05/2013 0832   BILITOT 0.5 10/07/2021 1414   BILITOT 0.4 07/08/2016 1259   BILITOT 0.4 08/05/2013 0832   PROT 7.2 10/07/2021 1414   PROT 7.0 07/08/2016 1259   PROT 6.8 08/05/2013 0832   ALBUMIN 3.7 10/07/2021 1414   ALBUMIN 4.0 07/08/2016 1259   ALBUMIN 3.4 08/05/2013 3716    RADIOGRAPHIC STUDIES: DG Chest Port 1 View  Result Date: 09/24/2021 CLINICAL DATA:  Port placement EXAM: PORTABLE CHEST 1 VIEW COMPARISON:  10/09/2020 FINDINGS: Interval placement of right IJ approach chest port with distal tip terminating at the level of the distal SVC. Heart size within normal limits. No focal airspace consolidation, pleural effusion, or pneumothorax. IMPRESSION: Satisfactory placement of right IJ approach chest port. No pneumothorax. Electronically Signed   By: Davina Poke D.O.   On: 09/24/2021 15:41   DG C-Arm 1-60 Min-No Report  Result Date: 09/24/2021 Fluoroscopy was utilized by the requesting physician.  No radiographic interpretation.   US Venous Img Upper Uni Left  Result Date: 09/22/2021 CLINICAL DATA:  Edema at injection site left wrist EXAM: Left UPPER EXTREMITY VENOUS DOPPLER ULTRASOUND TECHNIQUE: Gray-scale sonography with graded compression, as well as color Doppler and duplex ultrasound were performed to evaluate the upper extremity deep venous system from the level of the subclavian vein and including the jugular, axillary, basilic, radial, ulnar and upper cephalic vein. Spectral Doppler was utilized to evaluate flow at rest and with distal augmentation maneuvers. COMPARISON:  None Available. FINDINGS: Contralateral Subclavian Vein:  Respiratory phasicity is normal and symmetric with the symptomatic side. No evidence of thrombus. Normal compressibility. Internal Jugular Vein: No evidence of thrombus. Normal compressibility, respiratory phasicity and response to augmentation. Subclavian Vein: No evidence of thrombus. Normal compressibility, respiratory phasicity and response to augmentation. Axillary Vein: No evidence of thrombus. Normal compressibility, respiratory phasicity and response to augmentation. Cephalic Vein: Positive for occlusive thrombus within the distal cephalic vein from the wrist to the elbow. Noncompressible. Basilic Vein: Positive for nonocclusive thrombus within the proximal to mid basilic vein. Noncompressible. Brachial Veins: No evidence of thrombus.  Normal compressibility, Radial Veins: No evidence of thrombus.  Normal compressibility,. Ulnar Veins: No evidence of thrombus.  Normal compressibility, IMPRESSION: 1. Negative for acute DVT within the left upper extremity. 2. Positive for acute thrombus/superficial thrombophlebitis of the left cephalic and basilic veins. These results will be called to the ordering clinician or representative by the Radiologist Assistant, and communication documented in the PACS or [employer]. Electronically Signed   By: Donavan Foil M.D.   On: 09/22/2021 16:23    PERFORMANCE STATUS (ECOG) : 1 - Symptomatic but completely ambulatory  Review of Systems Unless otherwise noted, a complete review of systems is negative.  Physical Exam General: NAD Cardiovascular: regular rate and rhythm Pulmonary: clear ant fields Abdomen: soft, nontender, + bowel sounds, no CVA tenderness GU: no suprapubic tenderness Extremities: no edema, no joint deformities Skin: no rashes Neurological: Weakness but otherwise nonfocal  Assessment and Plan- Patient is a 61 y.o. female    Encounter Diagnoses  Name Primary?   Malignant neoplasm of overlapping sites of left breast in female, estrogen  receptor negative ([place]) Yes   Dehydration    Gross hematuria    Chemotherapy induced diarrhea    New. Appears to be acute cystitis with hematuria complicated by chemotherapy induced diarrhea- urine culture pending. Treating with Bactrim DS. Reviewed with patient and discussed how to take along with common potential side effects. She will stay  hydrated- 1L IVF given today. CBC/CMP reviewed today- she does not require IV electrolyte supplementation at this time. Platelets and hemoglobin are stable. She will continue her anti-diarrheals and oral intake of fluids at home. Plan for follow up later this week in [facility]. She has also agreed to schedule an OV with her PCP to discuss her Trulicity as she reports frequent UTIs which may be secondary to this medication especially now that she is on chemotherapy and is immunocompromised.   Disposition 1L IVF today RTC Thursday/Friday [facility] with labs (CBC, CMP, magnesium) +- fluids    Patient expressed understanding and was in agreement with this plan. She also understands that She can call clinic at any time with any questions, concerns, or complaints.   Thank you for allowing me to participate in the care of this very pleasant patient.   Time Total: 25  Visit consisted of counseling and education dealing with the complex and emotionally intense issues of symptom management in the setting of serious illness.Greater than 50%  of this time was spent counseling and coordinating care related to the above assessment and plan.  Signed by: Nelwyn Salisbury, PA-C

## 2021-10-13 NOTE — Progress Notes (Unsigned)
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  Patient stated she is unable to get tax statements, CSW stated her landlord mentioned she had not received payment for rent.  CSW reminded patient the application information has been turned in, I do not have control of when the payment is issued or if the payment approved. CSW stated I would send email to the [facility] and request an update, and I would update the patient once I had a reply.  Patient verbalized understanding.    Adelene Amas, LCSW

## 2021-10-13 NOTE — Progress Notes (Signed)
Vanessa Fisher called having diarrhea over the weekend with some blood noted, also feeling very weak.  She will see [facility] today with possible IVF's.

## 2021-10-14 LAB — URINE CULTURE: Culture: 30000 — AB

## 2021-10-15 ENCOUNTER — Ambulatory Visit: Payer: Medicare HMO

## 2021-10-15 ENCOUNTER — Ambulatory Visit: Payer: Medicare HMO | Admitting: Medical Oncology

## 2021-10-15 ENCOUNTER — Other Ambulatory Visit: Payer: Medicare HMO

## 2021-10-16 ENCOUNTER — Inpatient Hospital Stay: Payer: Medicare HMO

## 2021-10-16 ENCOUNTER — Encounter: Payer: Self-pay | Admitting: Hospice and Palliative Medicine

## 2021-10-16 ENCOUNTER — Inpatient Hospital Stay (HOSPITAL_BASED_OUTPATIENT_CLINIC_OR_DEPARTMENT_OTHER): Payer: Medicare HMO | Admitting: Hospice and Palliative Medicine

## 2021-10-16 ENCOUNTER — Other Ambulatory Visit: Payer: Self-pay

## 2021-10-16 VITALS — BP 107/55 | HR 79 | Temp 97.5°F | Resp 18 | Ht 66.0 in | Wt 215.8 lb

## 2021-10-16 DIAGNOSIS — E860 Dehydration: Secondary | ICD-10-CM | POA: Diagnosis not present

## 2021-10-16 DIAGNOSIS — D6481 Anemia due to antineoplastic chemotherapy: Secondary | ICD-10-CM | POA: Diagnosis not present

## 2021-10-16 DIAGNOSIS — Z171 Estrogen receptor negative status [ER-]: Secondary | ICD-10-CM

## 2021-10-16 DIAGNOSIS — Z95828 Presence of other vascular implants and grafts: Secondary | ICD-10-CM

## 2021-10-16 DIAGNOSIS — R197 Diarrhea, unspecified: Secondary | ICD-10-CM

## 2021-10-16 DIAGNOSIS — C50812 Malignant neoplasm of overlapping sites of left female breast: Secondary | ICD-10-CM | POA: Diagnosis not present

## 2021-10-16 DIAGNOSIS — Z5112 Encounter for antineoplastic immunotherapy: Secondary | ICD-10-CM | POA: Diagnosis not present

## 2021-10-16 DIAGNOSIS — I808 Phlebitis and thrombophlebitis of other sites: Secondary | ICD-10-CM | POA: Diagnosis not present

## 2021-10-16 DIAGNOSIS — Z5111 Encounter for antineoplastic chemotherapy: Secondary | ICD-10-CM | POA: Diagnosis not present

## 2021-10-16 DIAGNOSIS — K521 Toxic gastroenteritis and colitis: Secondary | ICD-10-CM | POA: Diagnosis not present

## 2021-10-16 LAB — CBC WITH DIFFERENTIAL/PLATELET
Abs Immature Granulocytes: 0.47 10*3/uL — ABNORMAL HIGH (ref 0.00–0.07)
Basophils Absolute: 0 10*3/uL (ref 0.0–0.1)
Basophils Relative: 0 %
Eosinophils Absolute: 0 10*3/uL (ref 0.0–0.5)
Eosinophils Relative: 0 %
HCT: 34.8 % — ABNORMAL LOW (ref 36.0–46.0)
Hemoglobin: 11.3 g/dL — ABNORMAL LOW (ref 12.0–15.0)
Immature Granulocytes: 4 %
Lymphocytes Relative: 29 %
Lymphs Abs: 3.1 10*3/uL (ref 0.7–4.0)
MCH: 26.5 pg (ref 26.0–34.0)
MCHC: 32.5 g/dL (ref 30.0–36.0)
MCV: 81.5 fL (ref 80.0–100.0)
Monocytes Absolute: 1.5 10*3/uL — ABNORMAL HIGH (ref 0.1–1.0)
Monocytes Relative: 14 %
Neutro Abs: 5.6 10*3/uL (ref 1.7–7.7)
Neutrophils Relative %: 53 %
Platelets: 239 10*3/uL (ref 150–400)
RBC: 4.27 MIL/uL (ref 3.87–5.11)
RDW: 15.2 % (ref 11.5–15.5)
Smear Review: NORMAL
WBC: 10.7 10*3/uL — ABNORMAL HIGH (ref 4.0–10.5)
nRBC: 0 % (ref 0.0–0.2)

## 2021-10-16 LAB — COMPREHENSIVE METABOLIC PANEL
ALT: 20 U/L (ref 0–44)
AST: 20 U/L (ref 15–41)
Albumin: 4.1 g/dL (ref 3.5–5.0)
Alkaline Phosphatase: 111 U/L (ref 38–126)
Anion gap: 7 (ref 5–15)
BUN: 20 mg/dL (ref 6–20)
CO2: 25 mmol/L (ref 22–32)
Calcium: 9.1 mg/dL (ref 8.9–10.3)
Chloride: 104 mmol/L (ref 98–111)
Creatinine, Ser: 1.04 mg/dL — ABNORMAL HIGH (ref 0.44–1.00)
GFR, Estimated: >60 mL/min (ref >60)
Glucose, Bld: 106 mg/dL — ABNORMAL HIGH (ref 70–99)
Potassium: 3.4 mmol/L — ABNORMAL LOW (ref 3.5–5.1)
Sodium: 136 mmol/L (ref 135–145)
Total Bilirubin: 0.3 mg/dL (ref 0.3–1.2)
Total Protein: 7.2 g/dL (ref 6.5–8.1)

## 2021-10-16 LAB — MAGNESIUM: Magnesium: 1.8 mg/dL (ref 1.7–2.4)

## 2021-10-16 MED ORDER — HEPARIN SOD (PORK) LOCK FLUSH 100 UNIT/ML IV SOLN
500.0000 [IU] | Freq: Once | INTRAVENOUS | Status: AC
  Administered 2021-10-16: 500 [IU]
  Filled 2021-10-16: qty 5

## 2021-10-16 MED ORDER — POTASSIUM CHLORIDE CRYS ER 10 MEQ PO TBCR: 10.0000 meq | EXTENDED_RELEASE_TABLET | Freq: Every day | ORAL | 0 refills | Status: DC

## 2021-10-16 MED ORDER — SODIUM CHLORIDE 0.9 % IV SOLN
INTRAVENOUS | Status: DC
  Filled 2021-10-16 (×2): qty 250

## 2021-10-16 MED ORDER — SODIUM CHLORIDE 0.9% FLUSH
10.0000 mL | Freq: Once | INTRAVENOUS | Status: AC
  Administered 2021-10-16: 10 mL via INTRAVENOUS
  Filled 2021-10-16: qty 10

## 2021-10-16 NOTE — Progress Notes (Signed)
Symptom Management [place] at [facility] [phone] [phone]  Patient Care Team: [place] as PCP - General Daiva Huge, RN as Oncology Nurse Navigator   NAME OF PATIENT: Vanessa Fisher  [id number]  Jan 27, 1961   DATE OF VISIT: 10/16/21  REASON FOR CONSULT: DANETTA PROM is a 61 y.o. 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   INTERVAL HISTORY: Patient developed superficial thrombophlebitis with cycle 1 TCHP chemotherapy.  She started on short course of Eliquis and is now status post port placement.  Patient was seen by Nelwyn Salisbury, PA-C on 10/13/2021 for diarrhea and hematuria.  Patient was started on Bactrim DS for presumed acute cystitis.  Patient returns [facility] today for follow-up.  Today, patient reports that she is still having diarrhea unrelieved with use of Imodium and Lomotil.  She describes 3-4 watery stools a day.  Stools are nonbloody without mucus.  No abdominal pain.  No fever or chills.  She is on Bactrim for UTI and reports resolution of urinary symptoms.  Denies any neurologic complaints. Denies recent fevers or illnesses. Denies any easy bleeding or bruising. Reports fair appetite and denies weight loss. Denies chest pain. Denies any nausea, vomiting, constipation. Denies urinary complaints. Patient offers no further specific complaints today.   PAST MEDICAL HISTORY: Past Medical History:  Diagnosis Date   Anemia    Anxiety    Arthritis    Asthma    WELL CONTROLLED   Bile acid malabsorption syndrome    Bradycardia    HAD AN ISSUE WITH THIS IN 2016-NO PROBLEMS SINCE   Breast cancer, left breast ([place]) 08/2021   Carpal tunnel syndrome on right    Depression    Diabetes mellitus without complication (HCC)    Diverticulitis    Gastric reflux    GERD (gastroesophageal reflux disease)    Hand pain 05/12/2016    Headache    H/O MIGRAINES   History of kidney stones    H/O   Lumbar radiculitis    Neck pain, chronic    Osteoarthritis of both knees    Panic attacks     PAST SURGICAL HISTORY:  Past Surgical History:  Procedure Laterality Date   BREAST BIOPSY Left 08/21/2021   Axilla Bx, Hydromarker, path pending   BREAST BIOPSY Left 08/21/2021   [place] Bx, Ribbon Clip, Path Pending   CARPAL TUNNEL RELEASE Right 12/06/2017   Procedure: CARPAL TUNNEL RELEASE;  Surgeon: Earnestine Leys, MD;  Location: [facility] ORS;  Service: Orthopedics;  Laterality: Right;   COLONOSCOPY WITH PROPOFOL N/A 06/11/2021   Procedure: COLONOSCOPY WITH PROPOFOL;  Surgeon: Lin Landsman, MD;  Location: [facility] ENDOSCOPY;  Service: Gastroenterology;  Laterality: N/A;   DORSAL COMPARTMENT RELEASE Right 12/06/2017   Procedure: RELEASE DORSAL COMPARTMENT (DEQUERVAIN);  Surgeon: Earnestine Leys, MD;  Location: [facility] ORS;  Service: Orthopedics;  Laterality: Right;   ESOPHAGOGASTRODUODENOSCOPY (EGD) WITH PROPOFOL N/A 06/11/2021   Procedure: ESOPHAGOGASTRODUODENOSCOPY (EGD) WITH PROPOFOL;  Surgeon: Lin Landsman, MD;  Location: [facility] ENDOSCOPY;  Service: Gastroenterology;  Laterality: N/A;   PARTIAL HYSTERECTOMY     PORTACATH PLACEMENT N/A 09/24/2021   Procedure: INSERTION PORT-A-CATH;  Surgeon: Herbert Pun, MD;  Location: [facility] ORS;  Service: General;  Laterality: N/A;   TUBAL LIGATION      HEMATOLOGY/ONCOLOGY HISTORY:  Oncology History  Breast cancer ([place])  07/31/2021 Imaging   Bilateral diagnostic mammogram and US showed 5 centimeter LEFT breast mass associated  with pleomorphic calcifications is suspicious for invasive ductal carcinoma.At least 4 LEFT axillary lymph nodes with abnormal morphology   08/21/2021 Cancer Staging   Staging form: Breast, AJCC 8th Edition - Clinical: Stage IIIB (cT4b, cN3c, cM0, G3, ER-, PR-, HER2+) - Signed by Earlie Server, MD on 08/28/2021 Histologic grading system: 3 grade system  -08/21/21  left breast ultrasound-guided biopsy showed invasive mammary carcinoma, grade 3, ER/PR negative, HER2 positive.  Left axillary lymph node biopsy positive for macro metastatic mammary carcinoma, 8 mm in greatest extent.   08/30/2021 Imaging   MRI brain w wo contrast -No metastatic disease or acute intracranial abnormality. Essentially normal for age MRI appearance of the brain.    09/03/2021 Echocardiogram   1. Left ventricular ejection fraction, by estimation, is 55 to 60%. Left ventricular ejection fraction by 3D volume is 58 %. The left ventricle has normal function. The left ventricle has no regional wall motion abnormalities. There is mild left  ventricular hypertrophy. Left ventricular diastolic parameters were normal.  2. Right ventricular systolic function is normal. The right ventricular size is normal.  3. The mitral valve is normal in structure. No evidence of mitral valve regurgitation.  4. The aortic valve was not well visualized. Aortic valve regurgitation is not visualized   09/10/2021 Imaging   PET scan showed Large hypermetabolic left breast mass and diffuse skin thickening,consistent with primary breast carcinoma. Hypermetabolic lymphadenopathy in the left axilla, left subpectoral region, and left supraclavicular region, consistent with metastatic disease. No evidence of metastatic disease within the abdomen or pelvis   09/12/2021 -  Chemotherapy   Patient is on Treatment Plan : BREAST  Docetaxel + Carboplatin + Trastuzumab + Pertuzumab  (TCHP) q21d        ALLERGIES:  is allergic to bc powder [aspirin-salicylamide-caffeine], gabapentin, hydrocodone-acetaminophen, latex, penicillin g, penicillins, and shrimp [shellfish allergy].  MEDICATIONS:  Current Outpatient Medications  Medication Sig Dispense Refill   albuterol (PROVENTIL) (2.5 MG/3ML) 0.083% nebulizer solution Take 3 mLs (2.5 mg total) by nebulization every 4 (four) hours as needed for wheezing or shortness of breath.  75 mL 2   albuterol (VENTOLIN HFA) 108 (90 Base) MCG/ACT inhaler Inhale 2 puffs into the lungs every 6 (six) hours as needed for wheezing or shortness of breath. 18 g 0   apixaban (ELIQUIS) 2.5 MG TABS tablet Take 1 tablet (2.5 mg total) by mouth 2 (two) times daily. 60 tablet 0   dexamethasone (DECADRON) 4 MG tablet Take 2 tablets (8 mg total) by mouth daily. Start the day before Taxotere. Then take daily x 2 days after chemotherapy. 30 tablet 1   dexamethasone (DECADRON) 4 MG tablet Take 1 tablet (4 mg total) by mouth 2 (two) times daily. 10 tablet 0   diphenoxylate-atropine (LOMOTIL) 2.5-0.025 MG tablet Take 1 tablet by mouth 4 (four) times daily. 60 tablet 1   FLOVENT DISKUS 250 MCG/ACT AEPB Inhale 1 puff into the lungs daily.     fluticasone (FLONASE) 50 MCG/ACT nasal spray Place 2 sprays into both nostrils daily. 15.8 mL 0   lidocaine-prilocaine (EMLA) cream Apply 1 Application topically as needed. 30 g 12   lisinopril-hydrochlorothiazide (ZESTORETIC) 10-12.5 MG tablet Take 1 tablet by mouth daily.     loperamide (IMODIUM) 2 MG capsule Take 2 mg by mouth as needed for diarrhea or loose stools.     loratadine (CLARITIN) 10 MG tablet Take 1 tablet (10 mg total) by mouth daily. 30 tablet 2   magic mouthwash (multi-ingredient) oral suspension Swish and  spit 5-10 ml by  mouth 4 times a day as needed 400 mL 1   magic mouthwash w/lidocaine SOLN Take 5 mLs by mouth 4 (four) times daily as needed for mouth pain. Sig: Swish/Swallow 5-10 ml four times a day as needed. Dispense 480 ml. 1RF 480 mL 1   montelukast (SINGULAIR) 10 MG tablet Take by mouth.     naloxone (NARCAN) nasal spray 4 mg/0.1 mL  (Patient not taking: Reported on 10/13/2021)     ondansetron (ZOFRAN) 8 MG tablet Take 1 tablet (8 mg total) by mouth 2 (two) times daily as needed (Nausea or vomiting). Start on the third day after chemotherapy. 30 tablet 1   oxyCODONE-acetaminophen (PERCOCET/ROXICET) 5-325 MG tablet Take 1 tablet by mouth every  6 (six) hours as needed for pain.     pantoprazole (PROTONIX) 40 MG tablet Take 40 mg by mouth daily.     prochlorperazine (COMPAZINE) 10 MG tablet Take 1 tablet (10 mg total) by mouth every 6 (six) hours as needed (Nausea or vomiting). 30 tablet 1   sulfamethoxazole-trimethoprim (BACTRIM DS) 800-160 MG tablet Take 1 tablet by mouth 2 (two) times daily for 5 days. 10 tablet 0   traMADol (ULTRAM) 50 MG tablet Take 1 tablet (50 mg total) by mouth every 6 (six) hours as needed. 10 tablet 0   TRULICITY 3 TR/7.1HA SOPN Inject 0.75 mg into the skin once a week. Monday     No current facility-administered medications for this visit.    VITAL SIGNS: There were no vitals taken for this visit. There were no vitals filed for this visit.   Estimated body mass index is 34.88 kg/m as calculated from the following:   Height as of 09/29/21: _0  (1.676 m).   Weight as of 10/07/21: 216 lb 1.6 oz (98 kg).  LABS: CBC:    Component Value Date/Time   WBC 9.6 10/13/2021 1049   HGB 11.9 (L) 10/13/2021 1049   HGB 11.9 (L) 05/27/2014 2215   HCT 37.2 10/13/2021 1049   HCT 36.4 05/27/2014 2215   PLT 223 10/13/2021 1049   PLT 293 05/27/2014 2215   MCV 82.7 10/13/2021 1049   MCV 81 05/27/2014 2215   NEUTROABS 8.7 (H) 10/13/2021 1049   NEUTROABS 5.4 05/27/2014 2215   LYMPHSABS 0.3 (L) 10/13/2021 1049   LYMPHSABS 3.0 05/27/2014 2215   MONOABS 0.2 10/13/2021 1049   MONOABS 0.6 05/27/2014 2215   EOSABS 0.0 10/13/2021 1049   EOSABS 0.0 05/27/2014 2215   BASOSABS 0.0 10/13/2021 1049   BASOSABS 0.0 05/27/2014 2215   Comprehensive Metabolic Panel:    Component Value Date/Time   NA 136 10/13/2021 1049   NA 143 07/08/2016 1259   NA 139 05/27/2014 2215   K 3.7 10/13/2021 1049   K 3.5 05/27/2014 2215   CL 100 10/13/2021 1049   CL 105 05/27/2014 2215   CO2 28 10/13/2021 1049   CO2 26 05/27/2014 2215   BUN 14 10/13/2021 1049   BUN 10 07/08/2016 1259   BUN 14 05/27/2014 2215   CREATININE 0.68 10/13/2021  1049   CREATININE 1.09 (H) 05/27/2014 2215   GLUCOSE 121 (H) 10/13/2021 1049   GLUCOSE 104 (H) 05/27/2014 2215   CALCIUM 9.2 10/13/2021 1049   CALCIUM 9.0 05/27/2014 2215   AST 23 10/13/2021 1049   AST < 5 (L) 08/05/2013 0832   ALT 20 10/13/2021 1049   ALT 24 08/05/2013 0832   ALKPHOS 117 10/13/2021 1049   ALKPHOS 97 08/05/2013 0832  BILITOT 0.7 10/13/2021 1049   BILITOT 0.4 07/08/2016 1259   BILITOT 0.4 08/05/2013 0832   PROT 7.2 10/13/2021 1049   PROT 7.0 07/08/2016 1259   PROT 6.8 08/05/2013 0832   ALBUMIN 3.8 10/13/2021 1049   ALBUMIN 4.0 07/08/2016 1259   ALBUMIN 3.4 08/05/2013 4944    RADIOGRAPHIC STUDIES: DG Chest Port 1 View  Result Date: 09/24/2021 CLINICAL DATA:  Port placement EXAM: PORTABLE CHEST 1 VIEW COMPARISON:  10/09/2020 FINDINGS: Interval placement of right IJ approach chest port with distal tip terminating at the level of the distal SVC. Heart size within normal limits. No focal airspace consolidation, pleural effusion, or pneumothorax. IMPRESSION: Satisfactory placement of right IJ approach chest port. No pneumothorax. Electronically Signed   By: Davina Poke D.O.   On: 09/24/2021 15:41   DG C-Arm 1-60 Min-No Report  Result Date: 09/24/2021 Fluoroscopy was utilized by the requesting physician.  No radiographic interpretation.   US Venous Img Upper Uni Left  Result Date: 09/22/2021 CLINICAL DATA:  Edema at injection site left wrist EXAM: Left UPPER EXTREMITY VENOUS DOPPLER ULTRASOUND TECHNIQUE: Gray-scale sonography with graded compression, as well as color Doppler and duplex ultrasound were performed to evaluate the upper extremity deep venous system from the level of the subclavian vein and including the jugular, axillary, basilic, radial, ulnar and upper cephalic vein. Spectral Doppler was utilized to evaluate flow at rest and with distal augmentation maneuvers. COMPARISON:  None Available. FINDINGS: Contralateral Subclavian Vein: Respiratory phasicity is  normal and symmetric with the symptomatic side. No evidence of thrombus. Normal compressibility. Internal Jugular Vein: No evidence of thrombus. Normal compressibility, respiratory phasicity and response to augmentation. Subclavian Vein: No evidence of thrombus. Normal compressibility, respiratory phasicity and response to augmentation. Axillary Vein: No evidence of thrombus. Normal compressibility, respiratory phasicity and response to augmentation. Cephalic Vein: Positive for occlusive thrombus within the distal cephalic vein from the wrist to the elbow. Noncompressible. Basilic Vein: Positive for nonocclusive thrombus within the proximal to mid basilic vein. Noncompressible. Brachial Veins: No evidence of thrombus.  Normal compressibility, Radial Veins: No evidence of thrombus.  Normal compressibility,. Ulnar Veins: No evidence of thrombus.  Normal compressibility, IMPRESSION: 1. Negative for acute DVT within the left upper extremity. 2. Positive for acute thrombus/superficial thrombophlebitis of the left cephalic and basilic veins. These results will be called to the ordering clinician or representative by the Radiologist Assistant, and communication documented in the PACS or [employer]. Electronically Signed   By: Donavan Foil M.D.   On: 09/22/2021 16:23    PERFORMANCE STATUS (ECOG) : 1 - Symptomatic but completely ambulatory  Review of Systems Unless otherwise noted, a complete review of systems is negative.  Physical Exam General: NAD Cardiovascular: regular rate and rhythm Pulmonary: clear ant fields Abdomen: soft, nontender, + bowel sounds GU: no suprapubic tenderness Extremities: no edema, no joint deformities Skin: no rashes Neurological: Weakness but otherwise nonfocal     IMPRESSION/PLAN: Diarrhea -Labs suggestive of mild dehydration/hypokalemia.  Likely diarrhea secondary to chemotherapy but will send stool studies to rule out infectious etiology.  Will proceed with IV  fluids.  Start conservative oral KCl replacement in light of AKI.  Continue antidiarrheals.   UTI -resolution of urinary symptoms.  Complete course of Bactrim.  Thrombophlebitis -wrist appears to be improving.  Continue apixaban.  We will plan to bring patient back early next week for repeat labs/fluids.   Patient expressed understanding and was in agreement with this plan. She also understands that She can call  clinic at any time with any questions, concerns, or complaints.   Thank you for allowing me to participate in the care of this very pleasant patient.   Time Total: 15 minutes  Visit consisted of counseling and education dealing with the complex and emotionally intense issues of symptom management in the setting of serious illness.Greater than 50%  of this time was spent counseling and coordinating care related to the above assessment and plan.  Signed by: Altha Harm, PhD, NP-C

## 2021-10-17 ENCOUNTER — Other Ambulatory Visit: Payer: Self-pay | Admitting: Lab

## 2021-10-17 ENCOUNTER — Other Ambulatory Visit: Payer: Self-pay

## 2021-10-17 DIAGNOSIS — R197 Diarrhea, unspecified: Secondary | ICD-10-CM

## 2021-10-17 DIAGNOSIS — Z171 Estrogen receptor negative status [ER-]: Secondary | ICD-10-CM

## 2021-10-17 DIAGNOSIS — Z5111 Encounter for antineoplastic chemotherapy: Secondary | ICD-10-CM | POA: Diagnosis not present

## 2021-10-17 DIAGNOSIS — I808 Phlebitis and thrombophlebitis of other sites: Secondary | ICD-10-CM | POA: Diagnosis not present

## 2021-10-17 DIAGNOSIS — K521 Toxic gastroenteritis and colitis: Secondary | ICD-10-CM | POA: Diagnosis not present

## 2021-10-17 DIAGNOSIS — C50812 Malignant neoplasm of overlapping sites of left female breast: Secondary | ICD-10-CM | POA: Diagnosis not present

## 2021-10-17 DIAGNOSIS — E860 Dehydration: Secondary | ICD-10-CM | POA: Diagnosis not present

## 2021-10-17 DIAGNOSIS — D6481 Anemia due to antineoplastic chemotherapy: Secondary | ICD-10-CM | POA: Diagnosis not present

## 2021-10-17 DIAGNOSIS — Z5112 Encounter for antineoplastic immunotherapy: Secondary | ICD-10-CM | POA: Diagnosis not present

## 2021-10-17 LAB — C DIFFICILE QUICK SCREEN W PCR REFLEX
C Diff antigen: NEGATIVE
C Diff interpretation: NOT DETECTED
C Diff toxin: NEGATIVE

## 2021-10-20 ENCOUNTER — Inpatient Hospital Stay: Payer: Medicare HMO

## 2021-10-20 ENCOUNTER — Other Ambulatory Visit: Payer: Self-pay

## 2021-10-20 ENCOUNTER — Encounter: Payer: Self-pay | Admitting: Licensed Clinical Social Worker

## 2021-10-20 VITALS — BP 109/67 | HR 82 | Temp 98.3°F | Resp 16

## 2021-10-20 DIAGNOSIS — R197 Diarrhea, unspecified: Secondary | ICD-10-CM

## 2021-10-20 DIAGNOSIS — Z5111 Encounter for antineoplastic chemotherapy: Secondary | ICD-10-CM | POA: Diagnosis not present

## 2021-10-20 DIAGNOSIS — E876 Hypokalemia: Secondary | ICD-10-CM

## 2021-10-20 DIAGNOSIS — Z5112 Encounter for antineoplastic immunotherapy: Secondary | ICD-10-CM | POA: Diagnosis not present

## 2021-10-20 DIAGNOSIS — E860 Dehydration: Secondary | ICD-10-CM | POA: Diagnosis not present

## 2021-10-20 DIAGNOSIS — Z95828 Presence of other vascular implants and grafts: Secondary | ICD-10-CM

## 2021-10-20 DIAGNOSIS — C50011 Malignant neoplasm of nipple and areola, right female breast: Secondary | ICD-10-CM

## 2021-10-20 DIAGNOSIS — Z171 Estrogen receptor negative status [ER-]: Secondary | ICD-10-CM | POA: Diagnosis not present

## 2021-10-20 DIAGNOSIS — C50812 Malignant neoplasm of overlapping sites of left female breast: Secondary | ICD-10-CM | POA: Diagnosis not present

## 2021-10-20 DIAGNOSIS — D6481 Anemia due to antineoplastic chemotherapy: Secondary | ICD-10-CM | POA: Diagnosis not present

## 2021-10-20 DIAGNOSIS — K521 Toxic gastroenteritis and colitis: Secondary | ICD-10-CM | POA: Diagnosis not present

## 2021-10-20 DIAGNOSIS — I808 Phlebitis and thrombophlebitis of other sites: Secondary | ICD-10-CM | POA: Diagnosis not present

## 2021-10-20 LAB — GI PATHOGEN PANEL BY PCR, STOOL

## 2021-10-20 LAB — CBC WITH DIFFERENTIAL/PLATELET
Abs Immature Granulocytes: 0.28 10*3/uL — ABNORMAL HIGH (ref 0.00–0.07)
Basophils Absolute: 0 10*3/uL (ref 0.0–0.1)
Basophils Relative: 0 %
Eosinophils Absolute: 0 10*3/uL (ref 0.0–0.5)
Eosinophils Relative: 0 %
HCT: 31.9 % — ABNORMAL LOW (ref 36.0–46.0)
Hemoglobin: 10.3 g/dL — ABNORMAL LOW (ref 12.0–15.0)
Immature Granulocytes: 3 %
Lymphocytes Relative: 30 %
Lymphs Abs: 2.9 10*3/uL (ref 0.7–4.0)
MCH: 26.4 pg (ref 26.0–34.0)
MCHC: 32.3 g/dL (ref 30.0–36.0)
MCV: 81.8 fL (ref 80.0–100.0)
Monocytes Absolute: 0.6 10*3/uL (ref 0.1–1.0)
Monocytes Relative: 6 %
Neutro Abs: 5.8 10*3/uL (ref 1.7–7.7)
Neutrophils Relative %: 61 %
Platelets: 170 10*3/uL (ref 150–400)
RBC: 3.9 MIL/uL (ref 3.87–5.11)
RDW: 15.9 % — ABNORMAL HIGH (ref 11.5–15.5)
WBC: 9.6 10*3/uL (ref 4.0–10.5)
nRBC: 0 % (ref 0.0–0.2)

## 2021-10-20 LAB — COMPREHENSIVE METABOLIC PANEL
ALT: 22 U/L (ref 0–44)
AST: 23 U/L (ref 15–41)
Albumin: 3.7 g/dL (ref 3.5–5.0)
Alkaline Phosphatase: 97 U/L (ref 38–126)
Anion gap: 6 (ref 5–15)
BUN: 16 mg/dL (ref 6–20)
CO2: 25 mmol/L (ref 22–32)
Calcium: 8.7 mg/dL — ABNORMAL LOW (ref 8.9–10.3)
Chloride: 106 mmol/L (ref 98–111)
Creatinine, Ser: 0.86 mg/dL (ref 0.44–1.00)
GFR, Estimated: >60 mL/min (ref >60)
Glucose, Bld: 114 mg/dL — ABNORMAL HIGH (ref 70–99)
Potassium: 2.9 mmol/L — ABNORMAL LOW (ref 3.5–5.1)
Sodium: 137 mmol/L (ref 135–145)
Total Bilirubin: 0.3 mg/dL (ref 0.3–1.2)
Total Protein: 6.6 g/dL (ref 6.5–8.1)

## 2021-10-20 LAB — MAGNESIUM: Magnesium: 1.6 mg/dL — ABNORMAL LOW (ref 1.7–2.4)

## 2021-10-20 MED ORDER — POTASSIUM CHLORIDE 20 MEQ/100ML IV SOLN
20.0000 meq | Freq: Once | INTRAVENOUS | Status: AC
  Administered 2021-10-20: 20 meq via INTRAVENOUS

## 2021-10-20 MED ORDER — SODIUM CHLORIDE 0.9% FLUSH
10.0000 mL | Freq: Once | INTRAVENOUS | Status: AC
  Administered 2021-10-20: 10 mL via INTRAVENOUS
  Filled 2021-10-20: qty 10

## 2021-10-20 MED ORDER — POTASSIUM CHLORIDE CRYS ER 10 MEQ PO TBCR: 20.0000 meq | EXTENDED_RELEASE_TABLET | Freq: Every day | ORAL | 0 refills | Status: DC

## 2021-10-20 MED ORDER — SODIUM CHLORIDE 0.9 % IV SOLN
INTRAVENOUS | Status: DC
  Filled 2021-10-20 (×2): qty 250

## 2021-10-20 MED ORDER — HEPARIN SOD (PORK) LOCK FLUSH 100 UNIT/ML IV SOLN
500.0000 [IU] | Freq: Once | INTRAVENOUS | Status: AC
  Administered 2021-10-20: 500 [IU] via INTRAVENOUS
  Filled 2021-10-20: qty 5

## 2021-10-20 MED ORDER — MAGNESIUM SULFATE 2 GM/50ML IV SOLN
2.0000 g | Freq: Once | INTRAVENOUS | Status: AC
  Administered 2021-10-20: 2 g via INTRAVENOUS
  Filled 2021-10-20: qty 50

## 2021-10-20 NOTE — Progress Notes (Addendum)
Vanessa Fisher Progress Note  3:58 PM - Fisher spoke with patient, patient would like for her son Vanessa, Fisher [phone] to be the primary contact for medical questions, appointments and reminders. SW also updated the patient on the status if the financial aid, and told her I was told it would be mailed to her landlord some time this week, four weeks from request date. Patient verbalized understanding.  Clinical Social Worker contacted patient by phone to update on financial aid information.  No answer or voicemail available to leave message.Vanessa Amas, LCSW

## 2021-10-22 ENCOUNTER — Inpatient Hospital Stay: Payer: Medicare HMO

## 2021-10-22 ENCOUNTER — Telehealth: Payer: Self-pay

## 2021-10-22 VITALS — BP 101/63 | HR 80 | Temp 98.0°F

## 2021-10-22 DIAGNOSIS — C50011 Malignant neoplasm of nipple and areola, right female breast: Secondary | ICD-10-CM

## 2021-10-22 DIAGNOSIS — R197 Diarrhea, unspecified: Secondary | ICD-10-CM

## 2021-10-22 DIAGNOSIS — E876 Hypokalemia: Secondary | ICD-10-CM

## 2021-10-22 DIAGNOSIS — Z5112 Encounter for antineoplastic immunotherapy: Secondary | ICD-10-CM | POA: Diagnosis not present

## 2021-10-22 DIAGNOSIS — C50812 Malignant neoplasm of overlapping sites of left female breast: Secondary | ICD-10-CM | POA: Diagnosis not present

## 2021-10-22 DIAGNOSIS — I808 Phlebitis and thrombophlebitis of other sites: Secondary | ICD-10-CM | POA: Diagnosis not present

## 2021-10-22 DIAGNOSIS — D6481 Anemia due to antineoplastic chemotherapy: Secondary | ICD-10-CM | POA: Diagnosis not present

## 2021-10-22 DIAGNOSIS — K521 Toxic gastroenteritis and colitis: Secondary | ICD-10-CM | POA: Diagnosis not present

## 2021-10-22 DIAGNOSIS — Z5111 Encounter for antineoplastic chemotherapy: Secondary | ICD-10-CM | POA: Diagnosis not present

## 2021-10-22 DIAGNOSIS — E860 Dehydration: Secondary | ICD-10-CM | POA: Diagnosis not present

## 2021-10-22 DIAGNOSIS — Z171 Estrogen receptor negative status [ER-]: Secondary | ICD-10-CM | POA: Diagnosis not present

## 2021-10-22 LAB — CBC WITH DIFFERENTIAL/PLATELET
Abs Immature Granulocytes: 0.06 10*3/uL (ref 0.00–0.07)
Basophils Absolute: 0 10*3/uL (ref 0.0–0.1)
Basophils Relative: 0 %
Eosinophils Absolute: 0 10*3/uL (ref 0.0–0.5)
Eosinophils Relative: 0 %
HCT: 30.4 % — ABNORMAL LOW (ref 36.0–46.0)
Hemoglobin: 10 g/dL — ABNORMAL LOW (ref 12.0–15.0)
Immature Granulocytes: 1 %
Lymphocytes Relative: 36 %
Lymphs Abs: 2.8 10*3/uL (ref 0.7–4.0)
MCH: 26.7 pg (ref 26.0–34.0)
MCHC: 32.9 g/dL (ref 30.0–36.0)
MCV: 81.3 fL (ref 80.0–100.0)
Monocytes Absolute: 0.4 10*3/uL (ref 0.1–1.0)
Monocytes Relative: 6 %
Neutro Abs: 4.4 10*3/uL (ref 1.7–7.7)
Neutrophils Relative %: 57 %
Platelets: 112 10*3/uL — ABNORMAL LOW (ref 150–400)
RBC: 3.74 MIL/uL — ABNORMAL LOW (ref 3.87–5.11)
RDW: 16.1 % — ABNORMAL HIGH (ref 11.5–15.5)
WBC: 7.6 10*3/uL (ref 4.0–10.5)
nRBC: 0 % (ref 0.0–0.2)

## 2021-10-22 LAB — MAGNESIUM: Magnesium: 1.6 mg/dL — ABNORMAL LOW (ref 1.7–2.4)

## 2021-10-22 LAB — COMPREHENSIVE METABOLIC PANEL
ALT: 24 U/L (ref 0–44)
AST: 23 U/L (ref 15–41)
Albumin: 3.6 g/dL (ref 3.5–5.0)
Alkaline Phosphatase: 102 U/L (ref 38–126)
Anion gap: 5 (ref 5–15)
BUN: 11 mg/dL (ref 6–20)
CO2: 25 mmol/L (ref 22–32)
Calcium: 8.4 mg/dL — ABNORMAL LOW (ref 8.9–10.3)
Chloride: 106 mmol/L (ref 98–111)
Creatinine, Ser: 0.72 mg/dL (ref 0.44–1.00)
GFR, Estimated: >60 mL/min (ref >60)
Glucose, Bld: 122 mg/dL — ABNORMAL HIGH (ref 70–99)
Potassium: 2.7 mmol/L — CL (ref 3.5–5.1)
Sodium: 136 mmol/L (ref 135–145)
Total Bilirubin: 0.4 mg/dL (ref 0.3–1.2)
Total Protein: 6.5 g/dL (ref 6.5–8.1)

## 2021-10-22 MED ORDER — HEPARIN SOD (PORK) LOCK FLUSH 100 UNIT/ML IV SOLN
500.0000 [IU] | Freq: Once | INTRAVENOUS | Status: AC
  Administered 2021-10-22: 500 [IU] via INTRAVENOUS
  Filled 2021-10-22: qty 5

## 2021-10-22 MED ORDER — MAGNESIUM SULFATE 2 GM/50ML IV SOLN
2.0000 g | Freq: Once | INTRAVENOUS | Status: AC
  Administered 2021-10-22: 2 g via INTRAVENOUS
  Filled 2021-10-22: qty 50

## 2021-10-22 MED ORDER — POTASSIUM CHLORIDE CRYS ER 20 MEQ PO TBCR: 20.0000 meq | EXTENDED_RELEASE_TABLET | Freq: Two times a day (BID) | ORAL | 0 refills | Status: DC

## 2021-10-22 MED ORDER — SODIUM CHLORIDE 0.9% FLUSH
10.0000 mL | Freq: Once | INTRAVENOUS | Status: AC
  Administered 2021-10-22: 10 mL via INTRAVENOUS
  Filled 2021-10-22: qty 10

## 2021-10-22 MED ORDER — SODIUM CHLORIDE 0.9 % IV SOLN
Freq: Once | INTRAVENOUS | Status: AC
  Filled 2021-10-22: qty 250

## 2021-10-22 MED ORDER — POTASSIUM CHLORIDE CRYS ER 10 MEQ PO TBCR: 20.0000 meq | EXTENDED_RELEASE_TABLET | Freq: Two times a day (BID) | ORAL | Status: DC

## 2021-10-22 MED ORDER — POTASSIUM CHLORIDE 20 MEQ/100ML IV SOLN
20.0000 meq | Freq: Once | INTRAVENOUS | Status: AC
  Administered 2021-10-22: 20 meq via INTRAVENOUS

## 2021-10-22 NOTE — Patient Instructions (Addendum)
You received IV potassium 20 meq and IV 2 grams of magnesium infusion today.  A new prescription for potassium 20 meq twice daily was sent to your pharmacy. Please take only 20 meq potassium tonight when you come home. Then starting tomorrow, take potassium 20 meq 1 tablet twice daily.

## 2021-10-22 NOTE — Telephone Encounter (Signed)
Informed pharmacy patient will have another Rx sent today 20 meQ K per Lauren and asked permission for patient to pickup due to critical K. Informed pharmacist patient stated she had issues picking this Rx up before. Per pharmacist this is a different Rx & there should be no trouble picking it up today. Patient informed and verbalizes understanding.

## 2021-10-22 NOTE — Progress Notes (Signed)
Critical value received from Collene Mares in lab-cancer center. Potassium 2.7. read back process performed with lab tech and Lauren, NP [phone]-  Order received for 2 grams of magnesium IV and 20 meq of potassium IV via port a cath over 1 hour.

## 2021-10-24 ENCOUNTER — Inpatient Hospital Stay: Payer: Medicare HMO

## 2021-10-24 DIAGNOSIS — Z5111 Encounter for antineoplastic chemotherapy: Secondary | ICD-10-CM | POA: Diagnosis not present

## 2021-10-24 DIAGNOSIS — E876 Hypokalemia: Secondary | ICD-10-CM

## 2021-10-24 DIAGNOSIS — K521 Toxic gastroenteritis and colitis: Secondary | ICD-10-CM | POA: Diagnosis not present

## 2021-10-24 DIAGNOSIS — E860 Dehydration: Secondary | ICD-10-CM

## 2021-10-24 DIAGNOSIS — Z5112 Encounter for antineoplastic immunotherapy: Secondary | ICD-10-CM | POA: Diagnosis not present

## 2021-10-24 DIAGNOSIS — C50011 Malignant neoplasm of nipple and areola, right female breast: Secondary | ICD-10-CM

## 2021-10-24 DIAGNOSIS — D6481 Anemia due to antineoplastic chemotherapy: Secondary | ICD-10-CM | POA: Diagnosis not present

## 2021-10-24 DIAGNOSIS — Z171 Estrogen receptor negative status [ER-]: Secondary | ICD-10-CM | POA: Diagnosis not present

## 2021-10-24 DIAGNOSIS — Z95828 Presence of other vascular implants and grafts: Secondary | ICD-10-CM

## 2021-10-24 DIAGNOSIS — C50812 Malignant neoplasm of overlapping sites of left female breast: Secondary | ICD-10-CM | POA: Diagnosis not present

## 2021-10-24 DIAGNOSIS — I808 Phlebitis and thrombophlebitis of other sites: Secondary | ICD-10-CM | POA: Diagnosis not present

## 2021-10-24 LAB — BASIC METABOLIC PANEL
Anion gap: 3 — ABNORMAL LOW (ref 5–15)
BUN: 13 mg/dL (ref 6–20)
CO2: 26 mmol/L (ref 22–32)
Calcium: 8.6 mg/dL — ABNORMAL LOW (ref 8.9–10.3)
Chloride: 111 mmol/L (ref 98–111)
Creatinine, Ser: 0.73 mg/dL (ref 0.44–1.00)
GFR, Estimated: >60 mL/min (ref >60)
Glucose, Bld: 102 mg/dL — ABNORMAL HIGH (ref 70–99)
Potassium: 3.4 mmol/L — ABNORMAL LOW (ref 3.5–5.1)
Sodium: 140 mmol/L (ref 135–145)

## 2021-10-24 LAB — MAGNESIUM: Magnesium: 1.7 mg/dL (ref 1.7–2.4)

## 2021-10-24 MED ORDER — HEPARIN SOD (PORK) LOCK FLUSH 100 UNIT/ML IV SOLN
500.0000 [IU] | Freq: Once | INTRAVENOUS | Status: AC
  Administered 2021-10-24: 500 [IU] via INTRAVENOUS
  Filled 2021-10-24: qty 5

## 2021-10-24 MED ORDER — SODIUM CHLORIDE 0.9% FLUSH
10.0000 mL | Freq: Once | INTRAVENOUS | Status: AC
  Administered 2021-10-24: 10 mL via INTRAVENOUS
  Filled 2021-10-24: qty 10

## 2021-10-24 MED ORDER — POTASSIUM CHLORIDE 20 MEQ/100ML IV SOLN
20.0000 meq | Freq: Once | INTRAVENOUS | Status: AC
  Administered 2021-10-24: 20 meq via INTRAVENOUS

## 2021-10-24 MED ORDER — SODIUM CHLORIDE 0.9 % IV SOLN
INTRAVENOUS | Status: DC
  Filled 2021-10-24: qty 250

## 2021-10-28 ENCOUNTER — Inpatient Hospital Stay: Payer: Medicare HMO

## 2021-10-28 ENCOUNTER — Telehealth: Payer: Self-pay | Admitting: Licensed Clinical Social Worker

## 2021-10-28 ENCOUNTER — Inpatient Hospital Stay (HOSPITAL_BASED_OUTPATIENT_CLINIC_OR_DEPARTMENT_OTHER): Payer: Medicare HMO | Admitting: Oncology

## 2021-10-28 ENCOUNTER — Encounter: Payer: Self-pay | Admitting: Oncology

## 2021-10-28 VITALS — BP 99/84 | HR 67 | Resp 18 | Ht 66.0 in | Wt 211.0 lb

## 2021-10-28 DIAGNOSIS — T451X5A Adverse effect of antineoplastic and immunosuppressive drugs, initial encounter: Secondary | ICD-10-CM | POA: Diagnosis not present

## 2021-10-28 DIAGNOSIS — K521 Toxic gastroenteritis and colitis: Secondary | ICD-10-CM

## 2021-10-28 DIAGNOSIS — Z5112 Encounter for antineoplastic immunotherapy: Secondary | ICD-10-CM | POA: Diagnosis not present

## 2021-10-28 DIAGNOSIS — C50812 Malignant neoplasm of overlapping sites of left female breast: Secondary | ICD-10-CM

## 2021-10-28 DIAGNOSIS — I809 Phlebitis and thrombophlebitis of unspecified site: Secondary | ICD-10-CM

## 2021-10-28 DIAGNOSIS — Z95828 Presence of other vascular implants and grafts: Secondary | ICD-10-CM

## 2021-10-28 DIAGNOSIS — D6481 Anemia due to antineoplastic chemotherapy: Secondary | ICD-10-CM

## 2021-10-28 DIAGNOSIS — E860 Dehydration: Secondary | ICD-10-CM | POA: Diagnosis not present

## 2021-10-28 DIAGNOSIS — Z5111 Encounter for antineoplastic chemotherapy: Secondary | ICD-10-CM | POA: Diagnosis not present

## 2021-10-28 DIAGNOSIS — I808 Phlebitis and thrombophlebitis of other sites: Secondary | ICD-10-CM | POA: Diagnosis not present

## 2021-10-28 DIAGNOSIS — Z171 Estrogen receptor negative status [ER-]: Secondary | ICD-10-CM

## 2021-10-28 LAB — CBC WITH DIFFERENTIAL/PLATELET
Abs Immature Granulocytes: 0.02 10*3/uL (ref 0.00–0.07)
Basophils Absolute: 0 10*3/uL (ref 0.0–0.1)
Basophils Relative: 1 %
Eosinophils Absolute: 0 10*3/uL (ref 0.0–0.5)
Eosinophils Relative: 0 %
HCT: 27.6 % — ABNORMAL LOW (ref 36.0–46.0)
Hemoglobin: 9.1 g/dL — ABNORMAL LOW (ref 12.0–15.0)
Immature Granulocytes: 1 %
Lymphocytes Relative: 39 %
Lymphs Abs: 1.7 10*3/uL (ref 0.7–4.0)
MCH: 27.3 pg (ref 26.0–34.0)
MCHC: 33 g/dL (ref 30.0–36.0)
MCV: 82.9 fL (ref 80.0–100.0)
Monocytes Absolute: 0.5 10*3/uL (ref 0.1–1.0)
Monocytes Relative: 12 %
Neutro Abs: 2 10*3/uL (ref 1.7–7.7)
Neutrophils Relative %: 47 %
Platelets: 139 10*3/uL — ABNORMAL LOW (ref 150–400)
RBC: 3.33 MIL/uL — ABNORMAL LOW (ref 3.87–5.11)
RDW: 17.2 % — ABNORMAL HIGH (ref 11.5–15.5)
WBC: 4.2 10*3/uL (ref 4.0–10.5)
nRBC: 0 % (ref 0.0–0.2)

## 2021-10-28 LAB — COMPREHENSIVE METABOLIC PANEL
ALT: 19 U/L (ref 0–44)
AST: 18 U/L (ref 15–41)
Albumin: 3.3 g/dL — ABNORMAL LOW (ref 3.5–5.0)
Alkaline Phosphatase: 79 U/L (ref 38–126)
Anion gap: 3 — ABNORMAL LOW (ref 5–15)
BUN: 13 mg/dL (ref 6–20)
CO2: 27 mmol/L (ref 22–32)
Calcium: 8.4 mg/dL — ABNORMAL LOW (ref 8.9–10.3)
Chloride: 108 mmol/L (ref 98–111)
Creatinine, Ser: 0.67 mg/dL (ref 0.44–1.00)
GFR, Estimated: >60 mL/min (ref >60)
Glucose, Bld: 111 mg/dL — ABNORMAL HIGH (ref 70–99)
Potassium: 3.4 mmol/L — ABNORMAL LOW (ref 3.5–5.1)
Sodium: 138 mmol/L (ref 135–145)
Total Bilirubin: 0.4 mg/dL (ref 0.3–1.2)
Total Protein: 6.2 g/dL — ABNORMAL LOW (ref 6.5–8.1)

## 2021-10-28 LAB — MAGNESIUM: Magnesium: 1.5 mg/dL — ABNORMAL LOW (ref 1.7–2.4)

## 2021-10-28 MED ORDER — HEPARIN SOD (PORK) LOCK FLUSH 100 UNIT/ML IV SOLN
500.0000 [IU] | Freq: Once | INTRAVENOUS | Status: AC
  Administered 2021-10-28: 500 [IU] via INTRAVENOUS
  Filled 2021-10-28: qty 5

## 2021-10-28 MED FILL — Dexamethasone Sodium Phosphate Inj 100 MG/10ML: INTRAMUSCULAR | Qty: 1 | Status: AC

## 2021-10-28 MED FILL — Fosaprepitant Dimeglumine For IV Infusion 150 MG (Base Eq): INTRAVENOUS | Qty: 5 | Status: AC

## 2021-10-28 NOTE — Assessment & Plan Note (Signed)
Recommend patient to continue utilize Imodium and Lomotil. Continue regular supportive care with hydration session twice a per week.

## 2021-10-28 NOTE — Assessment & Plan Note (Signed)
hemoglobin has decreased as expected.  Monitor counts.  

## 2021-10-28 NOTE — Assessment & Plan Note (Signed)
IV magnesium 2 g times daily. Continue supportive care check magnesium twice per week and IV magnesium as needed.

## 2021-10-28 NOTE — Progress Notes (Signed)
Hematology/Oncology Progress note [phone] [phone]         Patient Care Team: Vanessa Fisher as PCP - General Vanessa Huge, RN as Oncology Nurse Navigator  REFERRING PROVIDER: Earlie Server, MD   ASSESSMENT & PLAN:   Cancer Staging  Breast cancer [facility]) Staging form: Breast, AJCC 8th Edition - Clinical stage from 08/21/2021: Stage IIIB (cT4b, cN3c, cM0, G3, Fisher-, PR-, HER2+) - Signed by Vanessa Server, MD on 09/12/2021  Breast cancer ([place]) Left breast cancer, cT4b N3 Mx, Fisher/PR-, HER2 + Overall she tolerates chemotherapy except diarrhea. Clinically left breast mass has responded to treatment. Shared decision was made to stay on current regimen [facility] and continue rigorous supportive care.Labs are reviewed and discussed with patient. Proceed with TCHP, D3 Udenyca.  Recommend Claritin 21m daily  Encounter for antineoplastic chemotherapy Treatment plan as listed above  Anemia due to antineoplastic chemotherapy hemoglobin has decreased as expected.  Monitor counts.   Thrombophlebitis Improved I recommend patient to finish current supply of Eliquis 2.597mBID  No refill needed.   Chemotherapy induced diarrhea Recommend patient to continue utilize Imodium and Lomotil. Continue regular supportive care with hydration session twice a per week.   Hypomagnesemia IV magnesium 2 g times daily. Continue supportive care check magnesium twice per week and IV magnesium as needed.    No orders of the defined types were placed in this encounter.   All questions were answered. The patient knows to call the clinic with any problems questions or concerns.  Return of visit:  Treatment tmr,  Lab NP + IVF 1 week flex.  3 weeks. Lab MD TCHP w D3 udSula RumpleMD, PhD [facility] 10/28/2021     CHIEF COMPLAINTS/REASON FOR VISIT:  Follow-up for breast cancer  HISTORY OF PRESENTING ILLNESS:  ShMAKINZIE CONSIDINEs a  [age].o.   female with PMH listed below who was referred to me for evaluation of  left breast mass SUMMARY OF ONCOLOGIC HISTORY: Oncology History  [place]ast cancer [place] 07/[place] Imaging   Bilateral diagnostic mammogram and [place] 5 centimeter LEFT breast mass associated with pleomorphic calcifications is suspicious for invasive ductal carcinoma.At least 4 LEFT axillary lymph nodes with abnormal morphology   08/21/2021 Cancer Staging   Staging form: Breast, AJCC 8th Edition - Clinical: Stage IIIB (cT4b, cN3c, cM0, G3, Fisher-, PR-, HER2+) - Signed by YuEarlie ServerMD on 08/28/2021 Histologic grading system: 3 grade system  -08/21/21 left breast ultrasound-guided biopsy showed invasive mammary carcinoma, grade 3, Fisher/PR negative, HER2 positive.  Left axillary lymph node biopsy positive for macro metastatic mammary carcinoma, 8 mm in greatest extent.   08/30/2021 Imaging   MRI brain w wo contrast -No metastatic disease or acute intracranial abnormality. Essentially normal for age MRI appearance of the brain.    09/03/2021 Echocardiogram   1. Left ventricular ejection fraction, by estimation, is 55 to 60%. Left ventricular ejection fraction by 3D volume is 58 %. The left ventricle has normal function. The left ventricle has no regional wall motion abnormalities. There is mild left  ventricular hypertrophy. Left ventricular diastolic parameters were normal.  2. Right ventricular systolic function is normal. The right ventricular size is normal.  3. The mitral valve is normal in structure. No evidence of mitral valve regurgitation.  4. The aortic valve was not well visualized. Aortic valve regurgitation is not visualized   09/10/2021 Imaging   PET scan showed Large hypermetabolic left breast mass and diffuse  skin thickening,consistent with primary breast carcinoma. Hypermetabolic lymphadenopathy in the left axilla, left subpectoral region, and left supraclavicular region, consistent with metastatic disease. No  evidence of metastatic disease within the abdomen or pelvis   09/12/2021 -  Chemotherapy   Patient is on Treatment Plan : BREAST  Docetaxel + Carboplatin + Trastuzumab + Pertuzumab  (TCHP) q21d        INTERVAL HISTORY Vanessa Fisher is a 61 y.o. female who has above history reviewed by me today presents for follow up visit for Triple positive breast cancer treatment.  Tolerated cycle 1 neoadjuvant TCHP with GCSF support.  She feels well today.  Intermittent diarrhea, lomotil helps.  S/p port placement Left hand superficial thrombophlebitis, improved  MEDICAL HISTORY:  Past Medical History:  Diagnosis Date   Anemia    Anxiety    Arthritis    Asthma    WELL CONTROLLED   Bile acid malabsorption syndrome    Bradycardia    HAD AN ISSUE WITH THIS IN 2016-NO PROBLEMS SINCE   Breast cancer, left breast ([place]) 08/2021   Carpal tunnel syndrome on right    Depression    Diabetes mellitus without complication (HCC)    Diverticulitis    Gastric reflux    GERD (gastroesophageal reflux disease)    Hand pain 05/12/2016   Headache    H/O MIGRAINES   History of kidney stones    H/O   Lumbar radiculitis    Neck pain, chronic    Osteoarthritis of both knees    Panic attacks     SURGICAL HISTORY: Past Surgical History:  Procedure Laterality Date   BREAST BIOPSY Left 08/21/2021   Axilla Bx, Hydromarker, path pending   BREAST BIOPSY Left 08/21/2021   [place] Bx, Ribbon Clip, Path Pending   CARPAL TUNNEL RELEASE Right 12/06/2017   Procedure: CARPAL TUNNEL RELEASE;  Surgeon: Earnestine Leys, MD;  Location: [facility] ORS;  Service: Orthopedics;  Laterality: Right;   COLONOSCOPY WITH PROPOFOL N/A 06/11/2021   Procedure: COLONOSCOPY WITH PROPOFOL;  Surgeon: Lin Landsman, MD;  Location: [facility] ENDOSCOPY;  Service: Gastroenterology;  Laterality: N/A;   DORSAL COMPARTMENT RELEASE Right 12/06/2017   Procedure: RELEASE DORSAL COMPARTMENT (DEQUERVAIN);  Surgeon: Earnestine Leys, MD;  Location:  [facility] ORS;  Service: Orthopedics;  Laterality: Right;   ESOPHAGOGASTRODUODENOSCOPY (EGD) WITH PROPOFOL N/A 06/11/2021   Procedure: ESOPHAGOGASTRODUODENOSCOPY (EGD) WITH PROPOFOL;  Surgeon: Lin Landsman, MD;  Location: [facility] ENDOSCOPY;  Service: Gastroenterology;  Laterality: N/A;   PARTIAL HYSTERECTOMY     PORTACATH PLACEMENT N/A 09/24/2021   Procedure: INSERTION PORT-A-CATH;  Surgeon: Herbert Pun, MD;  Location: [facility] ORS;  Service: General;  Laterality: N/A;   TUBAL LIGATION      SOCIAL HISTORY: Social History   Socioeconomic History   Marital status: Single    Spouse name: Not on file   Number of children: Not on file   Years of education: Not on file   Highest education level: Not on file  Occupational History   Not on file  Tobacco Use   Smoking status: Former    Types: Cigarettes   Smokeless tobacco: Never  Vaping Use   Vaping Use: Never used  Substance and Sexual Activity   Alcohol use: No   Drug use: No   Sexual activity: Not on file  Other Topics Concern   Not on file  Social History Narrative   Lives alone   Social Determinants of Health   Financial Resource Strain: High Risk (08/15/2021)   Overall  Financial Resource Strain (CARDIA)    Difficulty of Paying Living Expenses: Very hard  Food Insecurity: Food Insecurity Present (09/09/2021)   Hunger Vital Sign    Worried About Running Out of Food in the Last Year: Often true    Ran Out of Food in the Last Year: Often true  Transportation Needs: No Transportation Needs (08/15/2021)   PRAPARE - Hydrologist (Medical): No    Lack of Transportation (Non-Medical): No  Physical Activity: Inactive (08/15/2021)   Exercise Vital Sign    Days of Exercise per Week: 0 days    Minutes of Exercise per Session: 0 min  Stress: Stress Concern Present (08/15/2021)   [place]    Feeling of Stress : Rather much  Social  Connections: Socially Isolated (08/15/2021)   Social Connection and Isolation Panel [NHANES]    Frequency of Communication with Friends and Family: Once a week    Frequency of Social Gatherings with Friends and Family: Once a week    Attends Religious Services: Never    Marine scientist or Organizations: No    Attends Music therapist: Not on file    Marital Status: Never married  Intimate Partner Violence: Not At Risk (08/15/2021)   Humiliation, Afraid, Rape, and Kick questionnaire    Fear of Current or Ex-Partner: No    Emotionally Abused: No    Physically Abused: No    Sexually Abused: No    FAMILY HISTORY: Family History  Problem Relation Age of Onset   Diabetes Mother    Hypertension Mother    Heart failure Mother    Pancreatic cancer Mother 39   Diabetes Father    Hypertension Father    Breast cancer Maternal Aunt    Cervical cancer Maternal Aunt    Lung cancer Son 60    ALLERGIES:  is allergic to bc powder [aspirin-salicylamide-caffeine], gabapentin, hydrocodone-acetaminophen, latex, penicillin g, penicillins, and shellfish allergy.  MEDICATIONS:  Current Outpatient Medications  Medication Sig Dispense Refill   albuterol (VENTOLIN HFA) 108 (90 Base) MCG/ACT inhaler Inhale 2 puffs into the lungs every 6 (six) hours as needed for wheezing or shortness of breath. 18 g 0   apixaban (ELIQUIS) 2.5 MG TABS tablet Take 1 tablet (2.5 mg total) by mouth 2 (two) times daily. 60 tablet 0   FLOVENT DISKUS 250 MCG/ACT AEPB Inhale 1 puff into the lungs daily.     fluticasone (FLONASE) 50 MCG/ACT nasal spray Place 2 sprays into both nostrils daily. 15.8 mL 0   lidocaine-prilocaine (EMLA) cream Apply 1 Application topically as needed. 30 g 12   lisinopril-hydrochlorothiazide (ZESTORETIC) 10-12.5 MG tablet Take 1 tablet by mouth daily.     loperamide (IMODIUM) 2 MG capsule Take 2 mg by mouth as needed for diarrhea or loose stools.     loratadine (CLARITIN) 10 MG tablet  Take 1 tablet (10 mg total) by mouth daily. 30 tablet 2   magic mouthwash (multi-ingredient) oral suspension Swish and spit 5-10 ml by  mouth 4 times a day as needed 400 mL 1   montelukast (SINGULAIR) 10 MG tablet Take by mouth.     naloxone (NARCAN) nasal spray 4 mg/0.1 mL      oxyCODONE-acetaminophen (PERCOCET/ROXICET) 5-325 MG tablet Take 1 tablet by mouth every 6 (six) hours as needed for pain.     pantoprazole (PROTONIX) 40 MG tablet Take 40 mg by mouth daily.     potassium  chloride (KLOR-CON M) 10 MEQ tablet Take 2 tablets (20 mEq total) by mouth daily. 14 tablet 0   potassium chloride SA (KLOR-CON M) 20 MEQ tablet Take 1 tablet (20 mEq total) by mouth 2 (two) times daily. 60 tablet 0   traMADol (ULTRAM) 50 MG tablet Take 1 tablet (50 mg total) by mouth every 6 (six) hours as needed. 10 tablet 0   TRULICITY 3 HU/7.6LY SOPN Inject 0.75 mg into the skin once a week. Monday     albuterol (PROVENTIL) (2.5 MG/3ML) 0.083% nebulizer solution Take 3 mLs (2.5 mg total) by nebulization every 4 (four) hours as needed for wheezing or shortness of breath. 75 mL 2   dexamethasone (DECADRON) 4 MG tablet Take 2 tablets (8 mg total) by mouth daily. Start the day before Taxotere. Then take daily x 2 days after chemotherapy. (Patient not taking: Reported on 10/16/2021) 30 tablet 1   diphenoxylate-atropine (LOMOTIL) 2.5-0.025 MG tablet Take 1 tablet by mouth 4 (four) times daily. (Patient not taking: Reported on 10/28/2021) 60 tablet 1   magic mouthwash w/lidocaine SOLN Take 5 mLs by mouth 4 (four) times daily as needed for mouth pain. Sig: Swish/Swallow 5-10 ml four times a day as needed. Dispense 480 ml. 1RF (Patient not taking: Reported on 10/28/2021) 480 mL 1   ondansetron (ZOFRAN) 8 MG tablet Take 1 tablet (8 mg total) by mouth 2 (two) times daily as needed (Nausea or vomiting). Start on the third day after chemotherapy. (Patient not taking: Reported on 10/16/2021) 30 tablet 1   prochlorperazine (COMPAZINE) 10 MG  tablet Take 1 tablet (10 mg total) by mouth every 6 (six) hours as needed (Nausea or vomiting). (Patient not taking: Reported on 10/16/2021) 30 tablet 1   No current facility-administered medications for this visit.    Review of Systems  Constitutional:  Negative for appetite change, chills, fatigue and fever.  HENT:   Negative for hearing loss and voice change.   Eyes:  Negative for eye problems.  Respiratory:  Negative for chest tightness and cough.   Cardiovascular:  Negative for chest pain.  Gastrointestinal:  Negative for abdominal distention, abdominal pain and blood in stool.  Endocrine: Negative for hot flashes.  Genitourinary:  Negative for difficulty urinating and frequency.   Musculoskeletal:  Positive for arthralgias and back pain.  Skin:  Negative for itching and rash.  Neurological:  Negative for extremity weakness.  Hematological:  Negative for adenopathy.  Psychiatric/Behavioral:  Negative for confusion.   Left breast pain  PHYSICAL EXAMINATION: ECOG PERFORMANCE STATUS: 1 - Symptomatic but completely ambulatory Vitals:   10/28/21 1103  BP: 99/84  Pulse: 67  Resp: 18  SpO2: 100%   Filed Weights   10/28/21 1057  Weight: 211 lb (95.7 kg)   Physical Exam Constitutional:      General: She is not in acute distress. HENT:     Head: Normocephalic and atraumatic.  Eyes:     General: No scleral icterus. Cardiovascular:     Rate and Rhythm: Normal rate and regular rhythm.     Heart sounds: Normal heart sounds.  Pulmonary:     Effort: Pulmonary effort is normal. No respiratory distress.     Breath sounds: No wheezing.  Abdominal:     General: Bowel sounds are normal. There is no distension.     Palpations: Abdomen is soft.  Musculoskeletal:        General: No deformity. Normal range of motion.     Cervical back: Normal range of  motion and neck supple.  Skin:    General: Skin is warm and dry.     Findings: No erythema or rash.  Neurological:     Mental  Status: She is alert and oriented to person, place, and time. Mental status is at baseline.     Cranial Nerves: No cranial nerve deficit.     Coordination: Coordination normal.  Psychiatric:        Mood and Affect: Mood normal.   Gross examination reviewed palpable fixed left breast firm mass, no nipple discharge,slightly retracted nipple with focal skin edema [peau d'orange].   No appreciable mass in the right breast.  No appreciable lymphadenopathy in the axilla bilaterally. 09/12/21    LABORATORY DATA:  I have reviewed the data as listed Lab Results  Component Value Date   WBC 4.2 10/28/2021   HGB 9.1 (L) 10/28/2021   HCT 27.6 (L) 10/28/2021   MCV 82.9 10/28/2021   PLT 139 (L) 10/28/2021   Lab Results  Component Value Date   NA 138 10/28/2021   K 3.4 (L) 10/28/2021   CL 108 10/28/2021   CO2 27 10/28/2021    RADIOGRAPHIC STUDIES: I have personally reviewed the radiological images as listed and agreed with the findings in the report. No results found.

## 2021-10-28 NOTE — Assessment & Plan Note (Signed)
Improved I recommend patient to finish current supply of Eliquis 2.'5mg'$  BID  No refill needed.

## 2021-10-28 NOTE — Assessment & Plan Note (Addendum)
Left breast cancer, cT4b N3 Mx, ER/PR-, HER2 + Overall she tolerates chemotherapy except diarrhea. Clinically left breast mass has responded to treatment. Shared decision was made to stay on current regimen with [facility] and continue rigorous supportive care.Labs are reviewed and discussed with patient. Proceed with TCHP, D3 Udenyca.  Recommend Claritin 23m daily

## 2021-10-28 NOTE — Assessment & Plan Note (Signed)
Treatment plan as listed above. 

## 2021-10-29 ENCOUNTER — Ambulatory Visit: Payer: Medicare HMO | Admitting: Oncology

## 2021-10-29 ENCOUNTER — Inpatient Hospital Stay: Payer: Medicare HMO

## 2021-10-29 ENCOUNTER — Other Ambulatory Visit: Payer: Medicare HMO

## 2021-10-29 ENCOUNTER — Ambulatory Visit: Payer: Medicare HMO

## 2021-10-29 VITALS — BP 130/69 | HR 80 | Temp 98.2°F | Resp 18 | Wt 211.0 lb

## 2021-10-29 DIAGNOSIS — I808 Phlebitis and thrombophlebitis of other sites: Secondary | ICD-10-CM | POA: Diagnosis not present

## 2021-10-29 DIAGNOSIS — Z5112 Encounter for antineoplastic immunotherapy: Secondary | ICD-10-CM | POA: Diagnosis not present

## 2021-10-29 DIAGNOSIS — C50812 Malignant neoplasm of overlapping sites of left female breast: Secondary | ICD-10-CM | POA: Diagnosis not present

## 2021-10-29 DIAGNOSIS — Z171 Estrogen receptor negative status [ER-]: Secondary | ICD-10-CM

## 2021-10-29 DIAGNOSIS — C50011 Malignant neoplasm of nipple and areola, right female breast: Secondary | ICD-10-CM

## 2021-10-29 DIAGNOSIS — D6481 Anemia due to antineoplastic chemotherapy: Secondary | ICD-10-CM | POA: Diagnosis not present

## 2021-10-29 DIAGNOSIS — Z5111 Encounter for antineoplastic chemotherapy: Secondary | ICD-10-CM | POA: Diagnosis not present

## 2021-10-29 DIAGNOSIS — K521 Toxic gastroenteritis and colitis: Secondary | ICD-10-CM

## 2021-10-29 DIAGNOSIS — E860 Dehydration: Secondary | ICD-10-CM | POA: Diagnosis not present

## 2021-10-29 MED ORDER — MONTELUKAST SODIUM 10 MG PO TABS
10.0000 mg | ORAL_TABLET | Freq: Once | ORAL | Status: AC
  Administered 2021-10-29: 10 mg via ORAL
  Filled 2021-10-29: qty 1

## 2021-10-29 MED ORDER — SODIUM CHLORIDE 0.9% FLUSH
10.0000 mL | INTRAVENOUS | Status: DC | PRN
  Administered 2021-10-29: 10 mL
  Filled 2021-10-29: qty 10

## 2021-10-29 MED ORDER — SODIUM CHLORIDE 0.9 % IV SOLN
Freq: Once | INTRAVENOUS | Status: AC
  Filled 2021-10-29: qty 250

## 2021-10-29 MED ORDER — ACETAMINOPHEN 325 MG PO TABS
650.0000 mg | ORAL_TABLET | Freq: Once | ORAL | Status: AC
  Administered 2021-10-29: 650 mg via ORAL
  Filled 2021-10-29: qty 2

## 2021-10-29 MED ORDER — SODIUM CHLORIDE 0.9 % IV SOLN
852.6000 mg | Freq: Once | INTRAVENOUS | Status: AC
  Administered 2021-10-29: 850 mg via INTRAVENOUS
  Filled 2021-10-29: qty 85

## 2021-10-29 MED ORDER — DIPHENHYDRAMINE HCL 25 MG PO CAPS
50.0000 mg | ORAL_CAPSULE | Freq: Once | ORAL | Status: AC
  Administered 2021-10-29: 50 mg via ORAL
  Filled 2021-10-29: qty 2

## 2021-10-29 MED ORDER — PALONOSETRON HCL INJECTION 0.25 MG/5ML
0.2500 mg | Freq: Once | INTRAVENOUS | Status: AC
  Administered 2021-10-29: 0.25 mg via INTRAVENOUS
  Filled 2021-10-29: qty 5

## 2021-10-29 MED ORDER — SODIUM CHLORIDE 0.9 % IV SOLN
420.0000 mg | Freq: Once | INTRAVENOUS | Status: AC
  Administered 2021-10-29: 420 mg via INTRAVENOUS
  Filled 2021-10-29: qty 14

## 2021-10-29 MED ORDER — SODIUM CHLORIDE 0.9 % IV SOLN
10.0000 mg | Freq: Once | INTRAVENOUS | Status: AC
  Administered 2021-10-29: 10 mg via INTRAVENOUS
  Filled 2021-10-29: qty 10

## 2021-10-29 MED ORDER — TRASTUZUMAB-ANNS CHEMO 150 MG IV SOLR
6.0000 mg/kg | Freq: Once | INTRAVENOUS | Status: AC
  Administered 2021-10-29: 588 mg via INTRAVENOUS
  Filled 2021-10-29: qty 28

## 2021-10-29 MED ORDER — HEPARIN SOD (PORK) LOCK FLUSH 100 UNIT/ML IV SOLN
500.0000 [IU] | Freq: Once | INTRAVENOUS | Status: AC | PRN
  Administered 2021-10-29: 500 [IU]
  Filled 2021-10-29: qty 5

## 2021-10-29 MED ORDER — MAGNESIUM SULFATE 2 GM/50ML IV SOLN
2.0000 g | Freq: Once | INTRAVENOUS | Status: AC
  Administered 2021-10-29: 2 g via INTRAVENOUS
  Filled 2021-10-29: qty 50

## 2021-10-29 MED ORDER — SODIUM CHLORIDE 0.9 % IV SOLN
75.0000 mg/m2 | Freq: Once | INTRAVENOUS | Status: AC
  Administered 2021-10-29: 160 mg via INTRAVENOUS
  Filled 2021-10-29: qty 16

## 2021-10-29 MED ORDER — SODIUM CHLORIDE 0.9 % IV SOLN
150.0000 mg | Freq: Once | INTRAVENOUS | Status: AC
  Administered 2021-10-29: 150 mg via INTRAVENOUS
  Filled 2021-10-29: qty 150

## 2021-10-29 NOTE — Patient Instructions (Signed)
[facility]  Discharge Instructions: Thank you for choosing [facility] to provide your oncology and hematology care.  If you have a lab appointment with the Cancer Center, please go directly to the Cancer Center and check in at the registration area.  Wear comfortable clothing and clothing appropriate for easy access to any Portacath or PICC line.   We strive to give you quality time with your provider. You may need to reschedule your appointment if you arrive late (15 or more minutes).  Arriving late affects you and other patients whose appointments are after yours.  Also, if you miss three or more appointments without notifying the office, you may be dismissed from the clinic at the provider's discretion.      For prescription refill requests, have your pharmacy contact our office and allow 72 hours for refills to be completed.       To help prevent nausea and vomiting after your treatment, we encourage you to take your nausea medication as directed.  BELOW ARE SYMPTOMS THAT SHOULD BE REPORTED IMMEDIATELY: *FEVER GREATER THAN 100.4 F (38 C) OR HIGHER *CHILLS OR SWEATING *NAUSEA AND VOMITING THAT IS NOT CONTROLLED WITH YOUR NAUSEA MEDICATION *UNUSUAL SHORTNESS OF BREATH *UNUSUAL BRUISING OR BLEEDING *URINARY PROBLEMS (pain or burning when urinating, or frequent urination) *BOWEL PROBLEMS (unusual diarrhea, constipation, pain near the anus) TENDERNESS IN MOUTH AND THROAT WITH OR WITHOUT PRESENCE OF ULCERS (sore throat, sores in mouth, or a toothache) UNUSUAL RASH, SWELLING OR PAIN  UNUSUAL VAGINAL DISCHARGE OR ITCHING   Items with * indicate a potential emergency and should be followed up as soon as possible or go to the Emergency Department if any problems should occur.  Please show the CHEMOTHERAPY ALERT CARD or IMMUNOTHERAPY ALERT CARD at check-in to the Emergency Department and triage nurse.  Should you have questions after your  visit or need to cancel or reschedule your appointment, please contact [facility]  [phone] and follow the prompts.  Office hours are 8:00 a.m. to 4:30 p.m. Monday - Friday. Please note that voicemails left after 4:00 p.m. may not be returned until the following business day.  We are closed weekends and major holidays. You have access to a nurse at all times for urgent questions. Please call the main number to the clinic [phone] and follow the prompts.  For any non-urgent questions, you may also contact your provider using MyChart. We now offer e-Visits for anyone 18 and older to request care online for non-urgent symptoms. For details visit [email or web address].   Also download the MyChart app! Go to the app store, search "MyChart", open the app, select [facility], and log in with your MyChart username and password.  Masks are optional in the cancer centers. If you would like for your care team to wear a mask while they are taking care of you, please let them know. For doctor visits, patients may have with them one support person who is at least 59 years old. At this time, visitors are not allowed in the infusion area.   

## 2021-10-30 ENCOUNTER — Ambulatory Visit: Payer: Self-pay | Admitting: Licensed Clinical Social Worker

## 2021-10-30 ENCOUNTER — Encounter: Payer: Self-pay | Admitting: Licensed Clinical Social Worker

## 2021-10-30 ENCOUNTER — Encounter: Payer: Self-pay | Admitting: Oncology

## 2021-10-30 DIAGNOSIS — Z1379 Encounter for other screening for genetic and chromosomal anomalies: Secondary | ICD-10-CM | POA: Insufficient documentation

## 2021-10-30 NOTE — Progress Notes (Signed)
HPI:   Ms. Maclaren was previously seen in the [facility] due to a personal and family history of cancer and concerns regarding a hereditary predisposition to cancer. Please refer to our prior cancer genetics clinic note for more information regarding our discussion, assessment and recommendations, at the time. Ms. Jobin recent genetic test results were disclosed to her, as were recommendations warranted by these results. These results and recommendations are discussed in more detail below.  CANCER HISTORY:  Oncology History  Breast cancer ([place])  07/31/2021 Imaging   Bilateral diagnostic mammogram and US showed 5 centimeter LEFT breast mass associated with pleomorphic calcifications is suspicious for invasive ductal carcinoma.At least 4 LEFT axillary lymph nodes with abnormal morphology   08/21/2021 Cancer Staging   Staging form: Breast, AJCC 8th Edition - Clinical: Stage IIIB (cT4b, cN3c, cM0, G3, ER-, PR-, HER2+) - Signed by Earlie Server, MD on 08/28/2021 Histologic grading system: 3 grade system  -08/21/21 left breast ultrasound-guided biopsy showed invasive mammary carcinoma, grade 3, ER/PR negative, HER2 positive.  Left axillary lymph node biopsy positive for macro metastatic mammary carcinoma, 8 mm in greatest extent.   08/30/2021 Imaging   MRI brain w wo contrast -No metastatic disease or acute intracranial abnormality. Essentially normal for age MRI appearance of the brain.    09/03/2021 Echocardiogram   1. Left ventricular ejection fraction, by estimation, is 55 to 60%. Left ventricular ejection fraction by 3D volume is 58 %. The left ventricle has normal function. The left ventricle has no regional wall motion abnormalities. There is mild left  ventricular hypertrophy. Left ventricular diastolic parameters were normal.  2. Right ventricular systolic function is normal. The right ventricular size is normal.  3. The mitral valve is normal in structure. No  evidence of mitral valve regurgitation.  4. The aortic valve was not well visualized. Aortic valve regurgitation is not visualized   09/10/2021 Imaging   PET scan showed Large hypermetabolic left breast mass and diffuse skin thickening,consistent with primary breast carcinoma. Hypermetabolic lymphadenopathy in the left axilla, left subpectoral region, and left supraclavicular region, consistent with metastatic disease. No evidence of metastatic disease within the abdomen or pelvis   09/12/2021 -  Chemotherapy   Patient is on Treatment Plan : BREAST  Docetaxel + Carboplatin + Trastuzumab + Pertuzumab  (TCHP) q21d       Genetic Testing   Negative genetic testing. No pathogenic variants identified on the [facility] Common Hereditary Cancers+RNA panel. The report date is 10/26/2021.  The Common Hereditary Cancers Panel + RNA offered by [facility] includes sequencing and/or deletion duplication testing of the following 47 genes: APC, ATM, AXIN2, BARD1, BMPR1A, BRCA1, BRCA2, BRIP1, CDH1, CDKN2A (p14ARF), CDKN2A (p16INK4a), CKD4, CHEK2, CTNNA1, DICER1, EPCAM (Deletion/duplication testing only), GREM1 (promoter region deletion/duplication testing only), KIT, MEN1, MLH1, MSH2, MSH3, MSH6, MUTYH, NBN, NF1, NHTL1, PALB2, PDGFRA, PMS2, POLD1, POLE, PTEN, RAD50, RAD51C, RAD51D, SDHB, SDHC, SDHD, SMAD4, SMARCA4. STK11, TP53, TSC1, TSC2, and VHL.  The following genes were evaluated for sequence changes only: SDHA and HOXB13 c.251G>A variant only.     FAMILY HISTORY:  We obtained a detailed, 4-generation family history.  Significant diagnoses are listed below: Family History  Problem Relation Age of Onset   Diabetes Mother    Hypertension Mother    Heart failure Mother    Pancreatic cancer Mother 39   Diabetes Father    Hypertension Father    Breast cancer Maternal Aunt    Cervical cancer Maternal Aunt    Lung  cancer Son 57   Ms. Peet had 2 sons and 1 daughter. One of her sons passed at 71 and had lung  cancer on his autopsy. Patient has 2 sisters, no cancers.   Ms. Waddle mother passed of pancreatic cancer at 36. Patient had 3 maternal aunts, 4 uncles. One aunt had breast cancer and another aunt had cervical cancer.    Ms. Fritze father is living at 80, patient has limited information about this side of the family.   Ms. Kathan is unaware of previous family history of genetic testing for hereditary cancer risks. There is no reported Ashkenazi Jewish ancestry. There is no known consanguinity.      GENETIC TEST RESULTS:  The [facility] Common Hereditary Cancers+RNA Panel found no pathogenic mutations.   The Common Hereditary Cancers Panel + RNA offered by [facility] includes sequencing and/or deletion duplication testing of the following 47 genes: APC, ATM, AXIN2, BARD1, BMPR1A, BRCA1, BRCA2, BRIP1, CDH1, CDKN2A (p14ARF), CDKN2A (p16INK4a), CKD4, CHEK2, CTNNA1, DICER1, EPCAM (Deletion/duplication testing only), GREM1 (promoter region deletion/duplication testing only), KIT, MEN1, MLH1, MSH2, MSH3, MSH6, MUTYH, NBN, NF1, NHTL1, PALB2, PDGFRA, PMS2, POLD1, POLE, PTEN, RAD50, RAD51C, RAD51D, SDHB, SDHC, SDHD, SMAD4, SMARCA4. STK11, TP53, TSC1, TSC2, and VHL.  The following genes were evaluated for sequence changes only: SDHA and HOXB13 c.251G>A variant only.   The test report has been scanned into EPIC and is located under the Molecular Pathology section of the Results Review tab.  A portion of the result report is included below for reference. Genetic testing reported out on 9.24.2023.      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  At this time, it is unknown if this variant is associated with an increased risk for cancer or if it is benign, but most uncertain variants are reclassified to benign. It should not be used to make medical management decisions. With time, we suspect the laboratory will determine  the significance of this variant, if any. If the laboratory reclassifies this variant, we will attempt to contact Ms. Muralles to discuss it further.   Even though a pathogenic variant was not identified, possible explanations for the cancer in the family may include: There may be no hereditary risk for cancer in the family. The cancers in Ms. Eisenhuth and/or her family may be sporadic/familial or due to other genetic and environmental factors. There may be a gene mutation in one of these genes that current testing methods cannot detect but that chance is small. There could be another gene that has not yet been discovered, or that we have not yet tested, that is responsible for the cancer diagnoses in the family.  It is also possible there is a hereditary cause for the cancer in the family that Ms. Luster did not inherit. The variant of uncertain significance detected in the PMS2 gene may be reclassified as a pathogenic variant in the future. At this time, we do not know if this variant increases the risk for cancer.  Therefore, it is important to remain in touch with cancer genetics in the future so that we can continue to offer Ms. Hulet the most up to date genetic testing.   ADDITIONAL GENETIC TESTING:  We discussed with Ms. Szafran that her genetic testing was fairly extensive.  If there are additional relevant genes identified to increase cancer risk that can be analyzed in the future, we would be happy to discuss and coordinate this testing at that  time.     CANCER SCREENING RECOMMENDATIONS:  Ms. Gharibian test result is considered negative (normal).  This means that we have not identified a hereditary cause for her personal and family history of cancer at this time.   An individual's cancer risk and medical management are not determined by genetic test results alone. Overall cancer risk assessment incorporates additional factors, including personal medical history,  family history, and any available genetic information that may result in a personalized plan for cancer prevention and surveillance. Therefore, it is recommended she continue to follow the cancer management and screening guidelines provided by her oncology and primary healthcare provider.  RECOMMENDATIONS FOR FAMILY MEMBERS:   Since she did not inherit a identifiable mutation in a cancer predisposition gene included on this panel, her children could not have inherited a known mutation from her in one of these genes. Individuals in this family might be at some increased risk of developing cancer, over the general population risk, due to the family history of cancer.  Individuals in the family should notify their providers of the family history of cancer. We recommend women in this family have a yearly mammogram beginning at age 10, or 60 years younger than the earliest onset of cancer, an annual clinical breast exam, and perform monthly breast self-exams.  Family members should have colonoscopies by at age 28, or earlier, as recommended by their providers. Other members of the family may still carry a pathogenic variant in one of these genes that Ms. Peed did not inherit. Based on the family history, we recommend her sisters/maternal relatives have genetic counseling and testing given her mother's pancreatic cancer. Ms. Cormany will let us know if we can be of any assistance in coordinating genetic counseling and/or testing for this family member.   We do not recommend familial testing for the PMS2 variant of uncertain significance (VUS).  FOLLOW-UP:  Lastly, we discussed with Ms. Bonneau that cancer genetics is a rapidly advancing field and it is possible that new genetic tests will be appropriate for her and/or her family members in the future. We encouraged her to remain in contact with cancer genetics on an annual basis so we can update her personal and family histories and let her know of  advances in cancer genetics that may benefit this family.   Our contact number was provided. Ms. Daughdrill questions were answered to her satisfaction, and she knows she is welcome to call us at anytime with additional questions or concerns.    Faith Rogue, MS, [facility] Genetic Counselor [email or web address] Phone: [phone]

## 2021-10-30 NOTE — Telephone Encounter (Signed)
I contacted Ms. Drotar to discuss her genetic testing results. No pathogenic variants were identified in the 48 genes analyzed. Detailed clinic note to follow.   The test report has been scanned into EPIC and is located under the Molecular Pathology section of the Results Review tab.  A portion of the result report is included below for reference.      Faith Rogue, MS, [facility] Genetic Counselor [email or web address] Phone: [phone]

## 2021-10-31 ENCOUNTER — Inpatient Hospital Stay: Payer: Medicare HMO

## 2021-10-31 DIAGNOSIS — C50812 Malignant neoplasm of overlapping sites of left female breast: Secondary | ICD-10-CM | POA: Diagnosis not present

## 2021-10-31 DIAGNOSIS — Z171 Estrogen receptor negative status [ER-]: Secondary | ICD-10-CM

## 2021-10-31 DIAGNOSIS — T451X5A Adverse effect of antineoplastic and immunosuppressive drugs, initial encounter: Secondary | ICD-10-CM

## 2021-10-31 DIAGNOSIS — D6481 Anemia due to antineoplastic chemotherapy: Secondary | ICD-10-CM | POA: Diagnosis not present

## 2021-10-31 DIAGNOSIS — K521 Toxic gastroenteritis and colitis: Secondary | ICD-10-CM | POA: Diagnosis not present

## 2021-10-31 DIAGNOSIS — C50011 Malignant neoplasm of nipple and areola, right female breast: Secondary | ICD-10-CM

## 2021-10-31 DIAGNOSIS — Z5111 Encounter for antineoplastic chemotherapy: Secondary | ICD-10-CM | POA: Diagnosis not present

## 2021-10-31 DIAGNOSIS — E860 Dehydration: Secondary | ICD-10-CM | POA: Diagnosis not present

## 2021-10-31 DIAGNOSIS — R519 Headache, unspecified: Secondary | ICD-10-CM

## 2021-10-31 DIAGNOSIS — I808 Phlebitis and thrombophlebitis of other sites: Secondary | ICD-10-CM | POA: Diagnosis not present

## 2021-10-31 DIAGNOSIS — Z5112 Encounter for antineoplastic immunotherapy: Secondary | ICD-10-CM | POA: Diagnosis not present

## 2021-10-31 LAB — BASIC METABOLIC PANEL
Anion gap: 4 — ABNORMAL LOW (ref 5–15)
BUN: 15 mg/dL (ref 6–20)
CO2: 26 mmol/L (ref 22–32)
Calcium: 8 mg/dL — ABNORMAL LOW (ref 8.9–10.3)
Chloride: 106 mmol/L (ref 98–111)
Creatinine, Ser: 0.74 mg/dL (ref 0.44–1.00)
GFR, Estimated: >60 mL/min (ref >60)
Glucose, Bld: 97 mg/dL (ref 70–99)
Potassium: 3.5 mmol/L (ref 3.5–5.1)
Sodium: 136 mmol/L (ref 135–145)

## 2021-10-31 LAB — MAGNESIUM: Magnesium: 2.2 mg/dL (ref 1.7–2.4)

## 2021-10-31 MED ORDER — SODIUM CHLORIDE 0.9% FLUSH
10.0000 mL | Freq: Once | INTRAVENOUS | Status: AC | PRN
  Administered 2021-10-31: 10 mL
  Filled 2021-10-31: qty 10

## 2021-10-31 MED ORDER — SODIUM CHLORIDE 0.9 % IV SOLN
Freq: Once | INTRAVENOUS | Status: AC
  Filled 2021-10-31: qty 250

## 2021-10-31 MED ORDER — HEPARIN SOD (PORK) LOCK FLUSH 100 UNIT/ML IV SOLN
500.0000 [IU] | Freq: Once | INTRAVENOUS | Status: AC | PRN
  Administered 2021-10-31: 500 [IU]
  Filled 2021-10-31: qty 5

## 2021-10-31 MED ORDER — ACETAMINOPHEN 325 MG PO TABS
650.0000 mg | ORAL_TABLET | Freq: Once | ORAL | Status: AC
  Administered 2021-10-31: 650 mg via ORAL

## 2021-10-31 NOTE — Progress Notes (Signed)
Pt complaining on headache, feels like allergies to her. She took a claritin prior to coming to clinic. Administered 650 mg acetaminophen. At time of discharge pt reports headache as much better. Received 1 L NS. Electrolytes WNL's today.

## 2021-11-04 ENCOUNTER — Other Ambulatory Visit: Payer: Self-pay

## 2021-11-04 ENCOUNTER — Encounter: Payer: Self-pay | Admitting: Nurse Practitioner

## 2021-11-04 ENCOUNTER — Other Ambulatory Visit: Payer: Self-pay | Admitting: Oncology

## 2021-11-04 ENCOUNTER — Inpatient Hospital Stay (HOSPITAL_BASED_OUTPATIENT_CLINIC_OR_DEPARTMENT_OTHER): Payer: Medicare HMO | Admitting: Nurse Practitioner

## 2021-11-04 ENCOUNTER — Inpatient Hospital Stay: Payer: Medicare HMO

## 2021-11-04 ENCOUNTER — Telehealth: Payer: Self-pay | Admitting: *Deleted

## 2021-11-04 ENCOUNTER — Inpatient Hospital Stay: Payer: Medicare HMO | Attending: Oncology

## 2021-11-04 VITALS — BP 103/69 | HR 98 | Temp 98.1°F | Wt 204.5 lb

## 2021-11-04 DIAGNOSIS — T451X5A Adverse effect of antineoplastic and immunosuppressive drugs, initial encounter: Secondary | ICD-10-CM | POA: Diagnosis not present

## 2021-11-04 DIAGNOSIS — C50812 Malignant neoplasm of overlapping sites of left female breast: Secondary | ICD-10-CM | POA: Insufficient documentation

## 2021-11-04 DIAGNOSIS — Z5111 Encounter for antineoplastic chemotherapy: Secondary | ICD-10-CM | POA: Diagnosis not present

## 2021-11-04 DIAGNOSIS — D6481 Anemia due to antineoplastic chemotherapy: Secondary | ICD-10-CM | POA: Diagnosis not present

## 2021-11-04 DIAGNOSIS — Z7901 Long term (current) use of anticoagulants: Secondary | ICD-10-CM | POA: Insufficient documentation

## 2021-11-04 DIAGNOSIS — Z5112 Encounter for antineoplastic immunotherapy: Secondary | ICD-10-CM | POA: Insufficient documentation

## 2021-11-04 DIAGNOSIS — Z79899 Other long term (current) drug therapy: Secondary | ICD-10-CM | POA: Diagnosis not present

## 2021-11-04 DIAGNOSIS — C50011 Malignant neoplasm of nipple and areola, right female breast: Secondary | ICD-10-CM

## 2021-11-04 DIAGNOSIS — R110 Nausea: Secondary | ICD-10-CM | POA: Diagnosis not present

## 2021-11-04 DIAGNOSIS — Z171 Estrogen receptor negative status [ER-]: Secondary | ICD-10-CM

## 2021-11-04 DIAGNOSIS — E876 Hypokalemia: Secondary | ICD-10-CM | POA: Insufficient documentation

## 2021-11-04 DIAGNOSIS — Z7952 Long term (current) use of systemic steroids: Secondary | ICD-10-CM | POA: Insufficient documentation

## 2021-11-04 DIAGNOSIS — D701 Agranulocytosis secondary to cancer chemotherapy: Secondary | ICD-10-CM | POA: Insufficient documentation

## 2021-11-04 DIAGNOSIS — E119 Type 2 diabetes mellitus without complications: Secondary | ICD-10-CM | POA: Insufficient documentation

## 2021-11-04 DIAGNOSIS — K521 Toxic gastroenteritis and colitis: Secondary | ICD-10-CM

## 2021-11-04 DIAGNOSIS — Z5189 Encounter for other specified aftercare: Secondary | ICD-10-CM | POA: Diagnosis not present

## 2021-11-04 LAB — CBC WITH DIFFERENTIAL/PLATELET
Abs Immature Granulocytes: 0.01 10*3/uL (ref 0.00–0.07)
Basophils Absolute: 0 10*3/uL (ref 0.0–0.1)
Basophils Relative: 1 %
Eosinophils Absolute: 0 10*3/uL (ref 0.0–0.5)
Eosinophils Relative: 1 %
HCT: 28.3 % — ABNORMAL LOW (ref 36.0–46.0)
Hemoglobin: 9.2 g/dL — ABNORMAL LOW (ref 12.0–15.0)
Immature Granulocytes: 1 %
Lymphocytes Relative: 65 %
Lymphs Abs: 0.8 10*3/uL (ref 0.7–4.0)
MCH: 27.3 pg (ref 26.0–34.0)
MCHC: 32.5 g/dL (ref 30.0–36.0)
MCV: 84 fL (ref 80.0–100.0)
Monocytes Absolute: 0.1 10*3/uL (ref 0.1–1.0)
Monocytes Relative: 4 %
Neutro Abs: 0.4 10*3/uL — CL (ref 1.7–7.7)
Neutrophils Relative %: 28 %
Platelets: 251 10*3/uL (ref 150–400)
RBC: 3.37 MIL/uL — ABNORMAL LOW (ref 3.87–5.11)
RDW: 17.1 % — ABNORMAL HIGH (ref 11.5–15.5)
Smear Review: NORMAL
WBC: 1.3 10*3/uL — CL (ref 4.0–10.5)
nRBC: 0 % (ref 0.0–0.2)

## 2021-11-04 LAB — BASIC METABOLIC PANEL
Anion gap: 4 — ABNORMAL LOW (ref 5–15)
BUN: 14 mg/dL (ref 6–20)
CO2: 27 mmol/L (ref 22–32)
Calcium: 9 mg/dL (ref 8.9–10.3)
Chloride: 104 mmol/L (ref 98–111)
Creatinine, Ser: 0.64 mg/dL (ref 0.44–1.00)
GFR, Estimated: >60 mL/min (ref >60)
Glucose, Bld: 107 mg/dL — ABNORMAL HIGH (ref 70–99)
Potassium: 3.9 mmol/L (ref 3.5–5.1)
Sodium: 135 mmol/L (ref 135–145)

## 2021-11-04 LAB — MAGNESIUM: Magnesium: 2 mg/dL (ref 1.7–2.4)

## 2021-11-04 MED ORDER — ALTEPLASE 2 MG IJ SOLR
2.0000 mg | Freq: Once | INTRAMUSCULAR | Status: DC | PRN
  Filled 2021-11-04: qty 2

## 2021-11-04 MED ORDER — HEPARIN SOD (PORK) LOCK FLUSH 100 UNIT/ML IV SOLN
250.0000 [IU] | Freq: Once | INTRAVENOUS | Status: AC | PRN
  Administered 2021-11-04: 250 [IU]
  Filled 2021-11-04: qty 5

## 2021-11-04 MED ORDER — HEPARIN SOD (PORK) LOCK FLUSH 100 UNIT/ML IV SOLN
500.0000 [IU] | Freq: Once | INTRAVENOUS | Status: DC | PRN
  Filled 2021-11-04: qty 5

## 2021-11-04 MED ORDER — SODIUM CHLORIDE 0.9 % IV SOLN
Freq: Once | INTRAVENOUS | Status: AC
  Filled 2021-11-04: qty 250

## 2021-11-04 MED ORDER — SODIUM CHLORIDE 0.9% FLUSH
3.0000 mL | Freq: Once | INTRAVENOUS | Status: DC | PRN
  Filled 2021-11-04: qty 3

## 2021-11-04 MED ORDER — SODIUM CHLORIDE 0.9 % IV SOLN: 8.0000 mg | Freq: Once | INTRAVENOUS | Status: DC

## 2021-11-04 MED ORDER — FILGRASTIM-SNDZ 480 MCG/0.8ML IJ SOSY
480.0000 ug | PREFILLED_SYRINGE | Freq: Once | INTRAMUSCULAR | Status: AC
  Administered 2021-11-04: 480 ug via SUBCUTANEOUS
  Filled 2021-11-04: qty 0.8

## 2021-11-04 MED ORDER — SODIUM CHLORIDE 0.9% FLUSH
10.0000 mL | Freq: Once | INTRAVENOUS | Status: AC | PRN
  Administered 2021-11-04: 10 mL
  Filled 2021-11-04: qty 10

## 2021-11-04 MED ORDER — ONDANSETRON HCL 4 MG/2ML IJ SOLN
8.0000 mg | Freq: Once | INTRAMUSCULAR | Status: AC
  Administered 2021-11-04: 8 mg via INTRAVENOUS

## 2021-11-04 MED ORDER — CIPROFLOXACIN HCL 500 MG PO TABS: 500.0000 mg | ORAL_TABLET | Freq: Two times a day (BID) | ORAL | 0 refills | Status: AC

## 2021-11-04 NOTE — Telephone Encounter (Signed)
Called patient per request of Beckey Rutter, NP.  Reviewed that her counts were low and she had been called in a prescription for an antibiotic to [facility] in [place].  Patient encouraged to call back if her sore throat worsens or she feels worse.  She is agreeable.

## 2021-11-04 NOTE — Progress Notes (Signed)
Given 1 L NS. Electrolytes were WNL's. Complaining of sore throat. Afebrile. Eating ice chips in clinic. Has been eating pedialyte popsicles.Pt was given Zarxio injection. Educated on neutropenic precautions. Instructed to call clinic for fever or any s/s of infection. Will return Weds and Thurs for zarxio injections. Fri for poss IVf's repeat labs and possible zarxio.

## 2021-11-04 NOTE — Progress Notes (Signed)
Hematology/Oncology Progress Note [phone] [phone]   Patient Care Team: [place], [place] as PCP - General Daiva Huge, RN as Oncology Nurse Navigator  REFERRING PROVIDER: [place], [place]   CHIEF COMPLAINTS/REASON FOR VISIT:  Follow-up for breast cancer  HISTORY OF PRESENTING ILLNESS:  Vanessa Fisher is a  61 y.o.  female with PMH listed below who was referred to me for evaluation of  left breast mass  SUMMARY OF ONCOLOGIC HISTORY: Oncology History  Breast cancer ([place])  07/31/2021 Imaging   Bilateral diagnostic mammogram and US showed 5 centimeter LEFT breast mass associated with pleomorphic calcifications is suspicious for invasive ductal carcinoma.At least 4 LEFT axillary lymph nodes with abnormal morphology   08/21/2021 Cancer Staging   Staging form: Breast, AJCC 8th Edition - Clinical: Stage IIIB (cT4b, cN3c, cM0, G3, ER-, PR-, HER2+) - Signed by Earlie Server, MD on 08/28/2021 Histologic grading system: 3 grade system  -08/21/21 left breast ultrasound-guided biopsy showed invasive mammary carcinoma, grade 3, ER/PR negative, HER2 positive.  Left axillary lymph node biopsy positive for macro metastatic mammary carcinoma, 8 mm in greatest extent.   08/30/2021 Imaging   MRI brain w wo contrast -No metastatic disease or acute intracranial abnormality. Essentially normal for age MRI appearance of the brain.    09/03/2021 Echocardiogram   1. Left ventricular ejection fraction, by estimation, is 55 to 60%. Left ventricular ejection fraction by 3D volume is 58 %. The left ventricle has normal function. The left ventricle has no regional wall motion abnormalities. There is mild left  ventricular hypertrophy. Left ventricular diastolic parameters were normal.  2. Right ventricular systolic function is normal. The right ventricular size is normal.  3. The mitral valve is normal in structure. No evidence of mitral valve regurgitation.  4. The aortic valve  was not well visualized. Aortic valve regurgitation is not visualized   09/10/2021 Imaging   PET scan showed Large hypermetabolic left breast mass and diffuse skin thickening,consistent with primary breast carcinoma. Hypermetabolic lymphadenopathy in the left axilla, left subpectoral region, and left supraclavicular region, consistent with metastatic disease. No evidence of metastatic disease within the abdomen or pelvis   09/12/2021 -  Chemotherapy   Patient is on Treatment Plan : BREAST  Docetaxel + Carboplatin + Trastuzumab + Pertuzumab  (TCHP) q21d       Genetic Testing   Negative genetic testing. No pathogenic variants identified on the [facility] Common Hereditary Cancers+RNA panel. The report date is 10/26/2021.  The Common Hereditary Cancers Panel + RNA offered by [facility] includes sequencing and/or deletion duplication testing of the following 47 genes: APC, ATM, AXIN2, BARD1, BMPR1A, BRCA1, BRCA2, BRIP1, CDH1, CDKN2A (p14ARF), CDKN2A (p16INK4a), CKD4, CHEK2, CTNNA1, DICER1, EPCAM (Deletion/duplication testing only), GREM1 (promoter region deletion/duplication testing only), KIT, MEN1, MLH1, MSH2, MSH3, MSH6, MUTYH, NBN, NF1, NHTL1, PALB2, PDGFRA, PMS2, POLD1, POLE, PTEN, RAD50, RAD51C, RAD51D, SDHB, SDHC, SDHD, SMAD4, SMARCA4. STK11, TP53, TSC1, TSC2, and VHL.  The following genes were evaluated for sequence changes only: SDHA and HOXB13 c.251G>A variant only.     INTERVAL HISTORY Vanessa Fisher is a 61 y.o. 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 She denies diarrhea. Complains of sore throat. Has not been drinking well d/t symptoms. Describes acid reflux. Has not taken antiemetics. No fevers or chills.    MEDICAL HISTORY:  Past Medical History:  Diagnosis Date   Anemia  Anxiety    Arthritis    Asthma    WELL CONTROLLED   Bile acid malabsorption syndrome     Bradycardia    HAD AN ISSUE WITH THIS IN 2016-NO PROBLEMS SINCE   Breast cancer, left breast ([place]) 08/2021   Carpal tunnel syndrome on right    Depression    Diabetes mellitus without complication (HCC)    Diverticulitis    Gastric reflux    GERD (gastroesophageal reflux disease)    Hand pain 05/12/2016   Headache    H/O MIGRAINES   History of kidney stones    H/O   Lumbar radiculitis    Neck pain, chronic    Osteoarthritis of both knees    Panic attacks     SURGICAL HISTORY: Past Surgical History:  Procedure Laterality Date   BREAST BIOPSY Left 08/21/2021   Axilla Bx, Hydromarker, path pending   BREAST BIOPSY Left 08/21/2021   [place] Bx, Ribbon Clip, Path Pending   CARPAL TUNNEL RELEASE Right 12/06/2017   Procedure: CARPAL TUNNEL RELEASE;  Surgeon: Earnestine Leys, MD;  Location: [facility] ORS;  Service: Orthopedics;  Laterality: Right;   COLONOSCOPY WITH PROPOFOL N/A 06/11/2021   Procedure: COLONOSCOPY WITH PROPOFOL;  Surgeon: Lin Landsman, MD;  Location: [facility] ENDOSCOPY;  Service: Gastroenterology;  Laterality: N/A;   DORSAL COMPARTMENT RELEASE Right 12/06/2017   Procedure: RELEASE DORSAL COMPARTMENT (DEQUERVAIN);  Surgeon: Earnestine Leys, MD;  Location: [facility] ORS;  Service: Orthopedics;  Laterality: Right;   ESOPHAGOGASTRODUODENOSCOPY (EGD) WITH PROPOFOL N/A 06/11/2021   Procedure: ESOPHAGOGASTRODUODENOSCOPY (EGD) WITH PROPOFOL;  Surgeon: Lin Landsman, MD;  Location: [facility] ENDOSCOPY;  Service: Gastroenterology;  Laterality: N/A;   PARTIAL HYSTERECTOMY     PORTACATH PLACEMENT N/A 09/24/2021   Procedure: INSERTION PORT-A-CATH;  Surgeon: Herbert Pun, MD;  Location: [facility] ORS;  Service: General;  Laterality: N/A;   TUBAL LIGATION      SOCIAL HISTORY: Social History   Socioeconomic History   Marital status: Single    Spouse name: Not on file   Number of children: Not on file   Years of education: Not on file   Highest education level: Not on file   Occupational History   Not on file  Tobacco Use   Smoking status: Former    Types: Cigarettes   Smokeless tobacco: Never  Vaping Use   Vaping Use: Never used  Substance and Sexual Activity   Alcohol use: No   Drug use: No   Sexual activity: Not on file  Other Topics Concern   Not on file  Social History Narrative   Lives alone   Social Determinants of Health   Financial Resource Strain: High Risk (08/15/2021)   Overall Financial Resource Strain (CARDIA)    Difficulty of Paying Living Expenses: Very hard  Food Insecurity: Food Insecurity Present (09/09/2021)   Hunger Vital Sign    Worried About Running Out of Food in the Last Year: Often true    Ran Out of Food in the Last Year: Often true  Transportation Needs: No Transportation Needs (08/15/2021)   PRAPARE - Hydrologist (Medical): No    Lack of Transportation (Non-Medical): No  Physical Activity: Inactive (08/15/2021)   Exercise Vital Sign    Days of Exercise per Week: 0 days    Minutes of Exercise per Session: 0 min  Stress: Stress Concern Present (08/15/2021)   [place]    Feeling of Stress : Rather much  Social Connections: Socially Isolated (08/15/2021)   Social Connection and Isolation Panel [NHANES]    Frequency of Communication with Friends and Family: Once a week    Frequency of Social Gatherings with Friends and Family: Once a week    Attends Religious Services: Never    Marine scientist or Organizations: No    Attends Music therapist: Not on file    Marital Status: Never married  Intimate Partner Violence: Not At Risk (08/15/2021)   Humiliation, Afraid, Rape, and Kick questionnaire    Fear of Current or Ex-Partner: No    Emotionally Abused: No    Physically Abused: No    Sexually Abused: No    FAMILY HISTORY: Family History  Problem Relation Age of Onset   Diabetes Mother    Hypertension  Mother    Heart failure Mother    Pancreatic cancer Mother 17   Diabetes Father    Hypertension Father    Breast cancer Maternal Aunt    Cervical cancer Maternal Aunt    Lung cancer Son 25    ALLERGIES:  is allergic to bc powder [aspirin-salicylamide-caffeine], gabapentin, hydrocodone-acetaminophen, latex, penicillin g, penicillins, and shellfish allergy.  MEDICATIONS:  Current Outpatient Medications  Medication Sig Dispense Refill   albuterol (PROVENTIL) (2.5 MG/3ML) 0.083% nebulizer solution Take 3 mLs (2.5 mg total) by nebulization every 4 (four) hours as needed for wheezing or shortness of breath. 75 mL 2   albuterol (VENTOLIN HFA) 108 (90 Base) MCG/ACT inhaler Inhale 2 puffs into the lungs every 6 (six) hours as needed for wheezing or shortness of breath. 18 g 0   apixaban (ELIQUIS) 2.5 MG TABS tablet Take 1 tablet (2.5 mg total) by mouth 2 (two) times daily. 60 tablet 0   dexamethasone (DECADRON) 4 MG tablet Take 2 tablets (8 mg total) by mouth daily. Start the day before Taxotere. Then take daily x 2 days after chemotherapy. (Patient not taking: Reported on 10/16/2021) 30 tablet 1   diphenoxylate-atropine (LOMOTIL) 2.5-0.025 MG tablet Take 1 tablet by mouth 4 (four) times daily. (Patient not taking: Reported on 10/28/2021) 60 tablet 1   FLOVENT DISKUS 250 MCG/ACT AEPB Inhale 1 puff into the lungs daily.     fluticasone (FLONASE) 50 MCG/ACT nasal spray Place 2 sprays into both nostrils daily. 15.8 mL 0   lidocaine-prilocaine (EMLA) cream Apply 1 Application topically as needed. 30 g 12   lisinopril-hydrochlorothiazide (ZESTORETIC) 10-12.5 MG tablet Take 1 tablet by mouth daily.     loperamide (IMODIUM) 2 MG capsule Take 2 mg by mouth as needed for diarrhea or loose stools.     loratadine (CLARITIN) 10 MG tablet Take 1 tablet (10 mg total) by mouth daily. 30 tablet 2   magic mouthwash (multi-ingredient) oral suspension Swish and spit 5-10 ml by  mouth 4 times a day as needed 400 mL 1    magic mouthwash w/lidocaine SOLN Take 5 mLs by mouth 4 (four) times daily as needed for mouth pain. Sig: Swish/Swallow 5-10 ml four times a day as needed. Dispense 480 ml. 1RF (Patient not taking: Reported on 10/28/2021) 480 mL 1   montelukast (SINGULAIR) 10 MG tablet Take by mouth.     naloxone (NARCAN) nasal spray 4 mg/0.1 mL      ondansetron (ZOFRAN) 8 MG tablet Take 1 tablet (8 mg total) by mouth 2 (two) times daily as needed (Nausea or vomiting). Start on the third day after chemotherapy. (Patient not taking: Reported on 10/16/2021) 30 tablet 1  oxyCODONE-acetaminophen (PERCOCET/ROXICET) 5-325 MG tablet Take 1 tablet by mouth every 6 (six) hours as needed for pain.     pantoprazole (PROTONIX) 40 MG tablet Take 40 mg by mouth daily.     potassium chloride (KLOR-CON M) 10 MEQ tablet Take 2 tablets (20 mEq total) by mouth daily. 14 tablet 0   potassium chloride SA (KLOR-CON M) 20 MEQ tablet Take 1 tablet (20 mEq total) by mouth 2 (two) times daily. 60 tablet 0   prochlorperazine (COMPAZINE) 10 MG tablet Take 1 tablet (10 mg total) by mouth every 6 (six) hours as needed (Nausea or vomiting). (Patient not taking: Reported on 10/16/2021) 30 tablet 1   traMADol (ULTRAM) 50 MG tablet Take 1 tablet (50 mg total) by mouth every 6 (six) hours as needed. 10 tablet 0   TRULICITY 3 YY/5.0PT SOPN Inject 0.75 mg into the skin once a week. Monday     No current facility-administered medications for this visit.   Facility-Administered Medications Ordered in Other Visits  Medication Dose Route Frequency Provider Last Rate Last Admin   heparin lock flush 100 unit/mL  500 Units Intracatheter Once PRN Earlie Server, MD        Review of Systems  Constitutional:  Positive for fatigue. Negative for appetite change and unexpected weight change.  HENT:   Positive for sore throat. Negative for mouth sores and trouble swallowing.        Rhinorrhea- mild  Respiratory:  Negative for chest tightness, cough and shortness of  breath.   Cardiovascular:  Negative for leg swelling.  Gastrointestinal:  Negative for abdominal pain, constipation, diarrhea, nausea and vomiting.  Genitourinary:  Negative for bladder incontinence and dysuria.   Musculoskeletal:  Negative for flank pain and neck stiffness.  Skin:  Negative for itching, rash and wound.  Neurological:  Negative for dizziness, headaches, light-headedness and numbness.  Hematological:  Negative for adenopathy. Does not bruise/bleed easily.  Psychiatric/Behavioral:  Negative for confusion, depression and sleep disturbance. The patient is not nervous/anxious.     PHYSICAL EXAMINATION: ECOG PERFORMANCE STATUS: 2 - Symptomatic, <50% confined to bed Vitals:   11/04/21 1111  BP: 103/69  Pulse: 98  Temp: 98.1 F (36.7 C)   Filed Weights   11/04/21 1111  Weight: 204 lb 8 oz (92.8 kg)   Physical Exam Constitutional:      General: She is not in acute distress.    Appearance: She is well-developed. She is not ill-appearing.     Comments: Fatigued appearing  HENT:     Nose: Rhinorrhea present.     Mouth/Throat:     Mouth: Mucous membranes are moist.     Pharynx: No oropharyngeal exudate or posterior oropharyngeal erythema.  Eyes:     General: No scleral icterus. Cardiovascular:     Rate and Rhythm: Normal rate and regular rhythm.  Pulmonary:     Effort: Pulmonary effort is normal.     Breath sounds: Normal breath sounds.  Abdominal:     General: There is no distension.     Palpations: Abdomen is soft.     Tenderness: There is no abdominal tenderness. There is no rebound.  Musculoskeletal:        General: No tenderness or deformity.     Right lower leg: No edema.     Left lower leg: No edema.  Skin:    General: Skin is warm and dry.     Coloration: Skin is not pale.  Neurological:     General: No focal  deficit present.     Mental Status: She is alert and oriented to person, place, and time.     Motor: No weakness.  Psychiatric:        Mood  and Affect: Mood normal.        Behavior: Behavior normal.    LABORATORY DATA:  I have reviewed the data as listed Lab Results  Component Value Date   WBC 4.2 10/28/2021   HGB 9.1 (L) 10/28/2021   HCT 27.6 (L) 10/28/2021   MCV 82.9 10/28/2021   PLT 139 (L) 10/28/2021   Lab Results  Component Value Date   NA 136 10/31/2021   K 3.5 10/31/2021   CL 106 10/31/2021   CO2 26 10/31/2021    RADIOGRAPHIC STUDIES: I have personally reviewed the radiological images as listed and agreed with the findings in the report. No results found.  ASSESSMENT & PLAN:   Cancer Staging  Breast cancer [facility]) Staging form: Breast, AJCC 8th Edition - Clinical stage from 08/21/2021: Stage IIIB (cT4b, cN3c, cM0, G3, ER-, PR-, HER2+) - Signed by Earlie Server, MD on 09/12/2021  Breast cancer [facility])- Left breast cancer, cT4b N3 Mx, ER/PR-, HER2 +, currently receiving neoadjuvant TCHP chemotherapy with udenyca support. Tolerating moderately - diarrhea (see below) Chemotherapy induced neutropenia- Patient did not receive udenyca on D3 with cycle 3. Plan for labs today, cbc. WBC 1.3, ANC 0.4. Will authorize short acting gcsf 5 mcg/kg/day x 3 days. Recheck counts later this week +/- zarxio and again Monday. Plan to switch back to long acting GCSF for cycle 4. Dr Tasia Catchings recommends empiric coverage with cipro 500 mg BID x 7 days. May need to extend coverage based on ANC. Will follow up with her next week to review.  Anemia due to antineoplastic chemotherapy- hmg 9.2. Monitor.  Thrombophlebitis- symptomatically improved. She will complete eliquis 2.5 mg BID then stop.  Chemotherapy induced diarrhea- Continue imodium and lomotil. She will require electrolyte replacement and hydration intermittently between cycles. She will report worsening symptoms.  Chemotherapy induced nausea- IV zofran today. Continue antiemetics as prescribed. If refractory, consider zyprexa.  Hypomagnesemia- secondary to GI losses. IV magnesium 2 g times  daily. Continue checking magnesium levels twice weekly +/- IV magnesium. Today, Mg 2.0.  Sore throat- question chemo induced nausea vs infectious vs esophagitis. Monitor closely. Cipro doesn't have good coverage for strep so if she appears more infectious, may need to adjust antibiotic therapy, consider augmentin. Also encouraged her to take covid test. Infectious precautions reviewed.   All questions were answered. The patient knows to call the clinic with any problems questions or concerns.  Return of visit:  Zarxio today Wednesday, Thursday- zarxio only Friday- lab (cbc), Possible zarxio Monday- lab (cbc), see me, possible zarxio- la  Beckey Rutter, DNP, AGNP-C [place] [facility] [phone] (clinic)

## 2021-11-05 ENCOUNTER — Ambulatory Visit: Payer: Medicare HMO

## 2021-11-05 ENCOUNTER — Inpatient Hospital Stay: Payer: Medicare HMO

## 2021-11-05 DIAGNOSIS — Z5111 Encounter for antineoplastic chemotherapy: Secondary | ICD-10-CM | POA: Diagnosis not present

## 2021-11-05 DIAGNOSIS — M545 Low back pain, unspecified: Secondary | ICD-10-CM | POA: Diagnosis not present

## 2021-11-05 DIAGNOSIS — Z5189 Encounter for other specified aftercare: Secondary | ICD-10-CM | POA: Diagnosis not present

## 2021-11-05 DIAGNOSIS — C50011 Malignant neoplasm of nipple and areola, right female breast: Secondary | ICD-10-CM

## 2021-11-05 DIAGNOSIS — Z5112 Encounter for antineoplastic immunotherapy: Secondary | ICD-10-CM | POA: Diagnosis not present

## 2021-11-05 DIAGNOSIS — K521 Toxic gastroenteritis and colitis: Secondary | ICD-10-CM

## 2021-11-05 DIAGNOSIS — G8929 Other chronic pain: Secondary | ICD-10-CM | POA: Diagnosis not present

## 2021-11-05 DIAGNOSIS — E876 Hypokalemia: Secondary | ICD-10-CM | POA: Diagnosis not present

## 2021-11-05 DIAGNOSIS — Z6833 Body mass index (BMI) 33.0-33.9, adult: Secondary | ICD-10-CM | POA: Diagnosis not present

## 2021-11-05 DIAGNOSIS — Z6832 Body mass index (BMI) 32.0-32.9, adult: Secondary | ICD-10-CM | POA: Diagnosis not present

## 2021-11-05 DIAGNOSIS — C50812 Malignant neoplasm of overlapping sites of left female breast: Secondary | ICD-10-CM | POA: Diagnosis not present

## 2021-11-05 DIAGNOSIS — Z9181 History of falling: Secondary | ICD-10-CM | POA: Diagnosis not present

## 2021-11-05 DIAGNOSIS — T451X5A Adverse effect of antineoplastic and immunosuppressive drugs, initial encounter: Secondary | ICD-10-CM | POA: Diagnosis not present

## 2021-11-05 DIAGNOSIS — R7309 Other abnormal glucose: Secondary | ICD-10-CM | POA: Diagnosis not present

## 2021-11-05 DIAGNOSIS — D6481 Anemia due to antineoplastic chemotherapy: Secondary | ICD-10-CM | POA: Diagnosis not present

## 2021-11-05 DIAGNOSIS — D701 Agranulocytosis secondary to cancer chemotherapy: Secondary | ICD-10-CM | POA: Diagnosis not present

## 2021-11-05 MED ORDER — FILGRASTIM-SNDZ 480 MCG/0.8ML IJ SOSY
480.0000 ug | PREFILLED_SYRINGE | Freq: Once | INTRAMUSCULAR | Status: AC
  Administered 2021-11-05: 480 ug via SUBCUTANEOUS
  Filled 2021-11-05: qty 0.8

## 2021-11-06 ENCOUNTER — Inpatient Hospital Stay: Payer: Medicare HMO

## 2021-11-06 DIAGNOSIS — Z5189 Encounter for other specified aftercare: Secondary | ICD-10-CM | POA: Diagnosis not present

## 2021-11-06 DIAGNOSIS — C50011 Malignant neoplasm of nipple and areola, right female breast: Secondary | ICD-10-CM

## 2021-11-06 DIAGNOSIS — D6481 Anemia due to antineoplastic chemotherapy: Secondary | ICD-10-CM | POA: Diagnosis not present

## 2021-11-06 DIAGNOSIS — E876 Hypokalemia: Secondary | ICD-10-CM | POA: Diagnosis not present

## 2021-11-06 DIAGNOSIS — Z5112 Encounter for antineoplastic immunotherapy: Secondary | ICD-10-CM | POA: Diagnosis not present

## 2021-11-06 DIAGNOSIS — D701 Agranulocytosis secondary to cancer chemotherapy: Secondary | ICD-10-CM | POA: Diagnosis not present

## 2021-11-06 DIAGNOSIS — T451X5A Adverse effect of antineoplastic and immunosuppressive drugs, initial encounter: Secondary | ICD-10-CM

## 2021-11-06 DIAGNOSIS — C50812 Malignant neoplasm of overlapping sites of left female breast: Secondary | ICD-10-CM | POA: Diagnosis not present

## 2021-11-06 DIAGNOSIS — Z5111 Encounter for antineoplastic chemotherapy: Secondary | ICD-10-CM | POA: Diagnosis not present

## 2021-11-06 MED ORDER — FILGRASTIM-SNDZ 480 MCG/0.8ML IJ SOSY
480.0000 ug | PREFILLED_SYRINGE | Freq: Once | INTRAMUSCULAR | Status: AC
  Administered 2021-11-06: 480 ug via SUBCUTANEOUS
  Filled 2021-11-06: qty 0.8

## 2021-11-07 ENCOUNTER — Inpatient Hospital Stay: Payer: Medicare HMO

## 2021-11-07 ENCOUNTER — Other Ambulatory Visit: Payer: Self-pay | Admitting: Oncology

## 2021-11-07 VITALS — BP 114/77 | HR 90 | Temp 97.3°F

## 2021-11-07 DIAGNOSIS — C50812 Malignant neoplasm of overlapping sites of left female breast: Secondary | ICD-10-CM | POA: Diagnosis not present

## 2021-11-07 DIAGNOSIS — Z5111 Encounter for antineoplastic chemotherapy: Secondary | ICD-10-CM | POA: Diagnosis not present

## 2021-11-07 DIAGNOSIS — Z5112 Encounter for antineoplastic immunotherapy: Secondary | ICD-10-CM | POA: Diagnosis not present

## 2021-11-07 DIAGNOSIS — T451X5A Adverse effect of antineoplastic and immunosuppressive drugs, initial encounter: Secondary | ICD-10-CM

## 2021-11-07 DIAGNOSIS — D6481 Anemia due to antineoplastic chemotherapy: Secondary | ICD-10-CM | POA: Diagnosis not present

## 2021-11-07 DIAGNOSIS — E876 Hypokalemia: Secondary | ICD-10-CM | POA: Diagnosis not present

## 2021-11-07 DIAGNOSIS — D701 Agranulocytosis secondary to cancer chemotherapy: Secondary | ICD-10-CM | POA: Diagnosis not present

## 2021-11-07 DIAGNOSIS — K521 Toxic gastroenteritis and colitis: Secondary | ICD-10-CM

## 2021-11-07 DIAGNOSIS — C50011 Malignant neoplasm of nipple and areola, right female breast: Secondary | ICD-10-CM

## 2021-11-07 DIAGNOSIS — Z5189 Encounter for other specified aftercare: Secondary | ICD-10-CM | POA: Diagnosis not present

## 2021-11-07 LAB — BASIC METABOLIC PANEL
Anion gap: 8 (ref 5–15)
BUN: 16 mg/dL (ref 6–20)
CO2: 26 mmol/L (ref 22–32)
Calcium: 8.9 mg/dL (ref 8.9–10.3)
Chloride: 102 mmol/L (ref 98–111)
Creatinine, Ser: 0.94 mg/dL (ref 0.44–1.00)
GFR, Estimated: >60 mL/min (ref >60)
Glucose, Bld: 96 mg/dL (ref 70–99)
Potassium: 3.6 mmol/L (ref 3.5–5.1)
Sodium: 136 mmol/L (ref 135–145)

## 2021-11-07 LAB — CBC WITH DIFFERENTIAL/PLATELET
Abs Immature Granulocytes: 0.02 10*3/uL (ref 0.00–0.07)
Basophils Absolute: 0 10*3/uL (ref 0.0–0.1)
Basophils Relative: 0 %
Eosinophils Absolute: 0 10*3/uL (ref 0.0–0.5)
Eosinophils Relative: 0 %
HCT: 28.4 % — ABNORMAL LOW (ref 36.0–46.0)
Hemoglobin: 9.4 g/dL — ABNORMAL LOW (ref 12.0–15.0)
Immature Granulocytes: 1 %
Lymphocytes Relative: 64 %
Lymphs Abs: 1.7 10*3/uL (ref 0.7–4.0)
MCH: 27.7 pg (ref 26.0–34.0)
MCHC: 33.1 g/dL (ref 30.0–36.0)
MCV: 83.8 fL (ref 80.0–100.0)
Monocytes Absolute: 0.6 10*3/uL (ref 0.1–1.0)
Monocytes Relative: 24 %
Neutro Abs: 0.3 10*3/uL — CL (ref 1.7–7.7)
Neutrophils Relative %: 11 %
Platelets: 231 10*3/uL (ref 150–400)
RBC: 3.39 MIL/uL — ABNORMAL LOW (ref 3.87–5.11)
RDW: 16.5 % — ABNORMAL HIGH (ref 11.5–15.5)
WBC: 2.6 10*3/uL — ABNORMAL LOW (ref 4.0–10.5)
nRBC: 0 % (ref 0.0–0.2)

## 2021-11-07 LAB — MAGNESIUM: Magnesium: 1.7 mg/dL (ref 1.7–2.4)

## 2021-11-07 MED ORDER — SODIUM CHLORIDE 0.9 % IV SOLN
Freq: Once | INTRAVENOUS | Status: AC
  Filled 2021-11-07: qty 250

## 2021-11-07 MED ORDER — FILGRASTIM-SNDZ 480 MCG/0.8ML IJ SOSY
480.0000 ug | PREFILLED_SYRINGE | Freq: Once | INTRAMUSCULAR | Status: AC
  Administered 2021-11-07: 480 ug via SUBCUTANEOUS
  Filled 2021-11-07: qty 0.8

## 2021-11-07 MED ORDER — HEPARIN SOD (PORK) LOCK FLUSH 100 UNIT/ML IV SOLN
500.0000 [IU] | Freq: Once | INTRAVENOUS | Status: AC | PRN
  Administered 2021-11-07: 500 [IU]
  Filled 2021-11-07: qty 5

## 2021-11-07 MED ORDER — SODIUM CHLORIDE 0.9% FLUSH
10.0000 mL | Freq: Once | INTRAVENOUS | Status: AC | PRN
  Administered 2021-11-07: 10 mL
  Filled 2021-11-07: qty 10

## 2021-11-07 NOTE — Progress Notes (Signed)
Pt's sore throat is gone. She is only feeling some fatigue. Electrolytes WNL's. Received IVF's over 1 hour. Zarxio injection given today as well.Discharged to home at completion.

## 2021-11-10 ENCOUNTER — Inpatient Hospital Stay: Payer: Medicare HMO

## 2021-11-10 ENCOUNTER — Telehealth: Payer: Self-pay | Admitting: *Deleted

## 2021-11-10 ENCOUNTER — Inpatient Hospital Stay (HOSPITAL_BASED_OUTPATIENT_CLINIC_OR_DEPARTMENT_OTHER): Payer: Medicare HMO | Admitting: Nurse Practitioner

## 2021-11-10 ENCOUNTER — Encounter: Payer: Self-pay | Admitting: Nurse Practitioner

## 2021-11-10 VITALS — BP 101/43 | HR 86 | Temp 97.7°F | Resp 16 | Ht 66.0 in | Wt 205.0 lb

## 2021-11-10 DIAGNOSIS — C50011 Malignant neoplasm of nipple and areola, right female breast: Secondary | ICD-10-CM

## 2021-11-10 DIAGNOSIS — T451X5A Adverse effect of antineoplastic and immunosuppressive drugs, initial encounter: Secondary | ICD-10-CM

## 2021-11-10 DIAGNOSIS — E876 Hypokalemia: Secondary | ICD-10-CM | POA: Diagnosis not present

## 2021-11-10 DIAGNOSIS — D701 Agranulocytosis secondary to cancer chemotherapy: Secondary | ICD-10-CM

## 2021-11-10 DIAGNOSIS — Z5111 Encounter for antineoplastic chemotherapy: Secondary | ICD-10-CM | POA: Diagnosis not present

## 2021-11-10 DIAGNOSIS — Z5189 Encounter for other specified aftercare: Secondary | ICD-10-CM | POA: Diagnosis not present

## 2021-11-10 DIAGNOSIS — Z5112 Encounter for antineoplastic immunotherapy: Secondary | ICD-10-CM | POA: Diagnosis not present

## 2021-11-10 DIAGNOSIS — D6481 Anemia due to antineoplastic chemotherapy: Secondary | ICD-10-CM | POA: Diagnosis not present

## 2021-11-10 DIAGNOSIS — K521 Toxic gastroenteritis and colitis: Secondary | ICD-10-CM | POA: Diagnosis not present

## 2021-11-10 DIAGNOSIS — C50812 Malignant neoplasm of overlapping sites of left female breast: Secondary | ICD-10-CM | POA: Diagnosis not present

## 2021-11-10 LAB — CBC WITH DIFFERENTIAL/PLATELET
Abs Immature Granulocytes: 0.59 10*3/uL — ABNORMAL HIGH (ref 0.00–0.07)
Basophils Absolute: 0 10*3/uL (ref 0.0–0.1)
Basophils Relative: 0 %
Eosinophils Absolute: 0 10*3/uL (ref 0.0–0.5)
Eosinophils Relative: 0 %
HCT: 28.4 % — ABNORMAL LOW (ref 36.0–46.0)
Hemoglobin: 9.1 g/dL — ABNORMAL LOW (ref 12.0–15.0)
Immature Granulocytes: 8 %
Lymphocytes Relative: 32 %
Lymphs Abs: 2.3 10*3/uL (ref 0.7–4.0)
MCH: 27.6 pg (ref 26.0–34.0)
MCHC: 32 g/dL (ref 30.0–36.0)
MCV: 86.1 fL (ref 80.0–100.0)
Monocytes Absolute: 0.9 10*3/uL (ref 0.1–1.0)
Monocytes Relative: 12 %
Neutro Abs: 3.4 10*3/uL (ref 1.7–7.7)
Neutrophils Relative %: 48 %
Platelets: 188 10*3/uL (ref 150–400)
RBC: 3.3 MIL/uL — ABNORMAL LOW (ref 3.87–5.11)
RDW: 17.7 % — ABNORMAL HIGH (ref 11.5–15.5)
Smear Review: NORMAL
WBC: 7.2 10*3/uL (ref 4.0–10.5)
nRBC: 0.3 % — ABNORMAL HIGH (ref 0.0–0.2)

## 2021-11-10 LAB — COMPREHENSIVE METABOLIC PANEL
ALT: 17 U/L (ref 0–44)
AST: 21 U/L (ref 15–41)
Albumin: 3.8 g/dL (ref 3.5–5.0)
Alkaline Phosphatase: 97 U/L (ref 38–126)
Anion gap: 7 (ref 5–15)
BUN: 7 mg/dL (ref 6–20)
CO2: 25 mmol/L (ref 22–32)
Calcium: 9 mg/dL (ref 8.9–10.3)
Chloride: 107 mmol/L (ref 98–111)
Creatinine, Ser: 0.81 mg/dL (ref 0.44–1.00)
GFR, Estimated: >60 mL/min (ref >60)
Glucose, Bld: 109 mg/dL — ABNORMAL HIGH (ref 70–99)
Potassium: 3.6 mmol/L (ref 3.5–5.1)
Sodium: 139 mmol/L (ref 135–145)
Total Bilirubin: 0.3 mg/dL (ref 0.3–1.2)
Total Protein: 6.8 g/dL (ref 6.5–8.1)

## 2021-11-10 LAB — MAGNESIUM: Magnesium: 1.5 mg/dL — ABNORMAL LOW (ref 1.7–2.4)

## 2021-11-10 NOTE — Progress Notes (Unsigned)
Hematology/Oncology Progress Note [phone] [phone]   Patient Care Team: [facility] as PCP - General Moore, Ana K, RN as Oncology Nurse Navigator  REFERRING PROVIDER: [facility]   CHIEF COMPLAINTS/REASON FOR VISIT:  Follow-up for breast cancer  HISTORY OF PRESENTING ILLNESS:  Vanessa Fisher is a  61 y.o.  female with PMH listed below who was referred to me for evaluation of  left breast mass  SUMMARY OF ONCOLOGIC HISTORY: Oncology History  Breast cancer (HCC)  07/31/2021 Imaging   Bilateral diagnostic mammogram and US showed 5 centimeter LEFT breast mass associated with pleomorphic calcifications is suspicious for invasive ductal carcinoma.At least 4 LEFT axillary lymph nodes with abnormal morphology   08/21/2021 Cancer Staging   Staging form: Breast, AJCC 8th Edition - Clinical: Stage IIIB (cT4b, cN3c, cM0, G3, ER-, PR-, HER2+) - Signed by Yu, Zhou, MD on 08/28/2021 Histologic grading system: 3 grade system  -08/21/21 left breast ultrasound-guided biopsy showed invasive mammary carcinoma, grade 3, ER/PR negative, HER2 positive.  Left axillary lymph node biopsy positive for macro metastatic mammary carcinoma, 8 mm in greatest extent.   08/30/2021 Imaging   MRI brain w wo contrast -No metastatic disease or acute intracranial abnormality. Essentially normal for age MRI appearance of the brain.    09/03/2021 Echocardiogram   1. Left ventricular ejection fraction, by estimation, is 55 to 60%. Left ventricular ejection fraction by 3D volume is 58 %. The left ventricle has normal function. The left ventricle has no regional wall motion abnormalities. There is mild left  ventricular hypertrophy. Left ventricular diastolic parameters were normal.  2. Right ventricular systolic function is normal. The right ventricular size is normal.  3. The mitral valve is normal in structure. No evidence of mitral valve regurgitation.  4. The aortic valve  was not well visualized. Aortic valve regurgitation is not visualized   09/10/2021 Imaging   PET scan showed Large hypermetabolic left breast mass and diffuse skin thickening,consistent with primary breast carcinoma. Hypermetabolic lymphadenopathy in the left axilla, left subpectoral region, and left supraclavicular region, consistent with metastatic disease. No evidence of metastatic disease within the abdomen or pelvis   09/12/2021 -  Chemotherapy   Patient is on Treatment Plan : BREAST  Docetaxel + Carboplatin + Trastuzumab + Pertuzumab  (TCHP) q21d       Genetic Testing   Negative genetic testing. No pathogenic variants identified on the [facility] Common Hereditary Cancers+RNA panel. The report date is 10/26/2021.  The Common Hereditary Cancers Panel + RNA offered by [facility] includes sequencing and/or deletion duplication testing of the following 47 genes: APC, ATM, AXIN2, BARD1, BMPR1A, BRCA1, BRCA2, BRIP1, CDH1, CDKN2A (p14ARF), CDKN2A (p16INK4a), CKD4, CHEK2, CTNNA1, DICER1, EPCAM (Deletion/duplication testing only), GREM1 (promoter region deletion/duplication testing only), KIT, MEN1, MLH1, MSH2, MSH3, MSH6, MUTYH, NBN, NF1, NHTL1, PALB2, PDGFRA, PMS2, POLD1, POLE, PTEN, RAD50, RAD51C, RAD51D, SDHB, SDHC, SDHD, SMAD4, SMARCA4. STK11, TP53, TSC1, TSC2, and VHL.  The following genes were evaluated for sequence changes only: SDHA and HOXB13 c.251G>A variant only.     INTERVAL HISTORY Vanessa D Fisher is a 61 y.o. female with triple positive breast cancer currently receiving neoadjuvant TCHP with GCSF support who returns to clinic for follow up and consideration of zarxio. She did not receive long acting gcsf with most recent treatment and has been receiving short acting gcsf.    MEDICAL HISTORY:  Past Medical History:  Diagnosis Date   Anemia    Anxiety    Arthritis      Asthma    WELL CONTROLLED   Bile acid malabsorption syndrome    Bradycardia    HAD AN ISSUE WITH THIS IN 2016-NO  PROBLEMS SINCE   Breast cancer, left breast (HCC) 08/2021   Carpal tunnel syndrome on right    Depression    Diabetes mellitus without complication (HCC)    Diverticulitis    Gastric reflux    GERD (gastroesophageal reflux disease)    Hand pain 05/12/2016   Headache    H/O MIGRAINES   History of kidney stones    H/O   Lumbar radiculitis    Neck pain, chronic    Osteoarthritis of both knees    Panic attacks     SURGICAL HISTORY: Past Surgical History:  Procedure Laterality Date   BREAST BIOPSY Left 08/21/2021   Axilla Bx, Hydromarker, path pending   BREAST BIOPSY Left 08/21/2021   US Bx, Ribbon Clip, Path Pending   CARPAL TUNNEL RELEASE Right 12/06/2017   Procedure: CARPAL TUNNEL RELEASE;  Surgeon: Miller, Howard, MD;  Location: [facility];  Service: Orthopedics;  Laterality: Right;   COLONOSCOPY WITH PROPOFOL N/A 06/11/2021   Procedure: COLONOSCOPY WITH PROPOFOL;  Surgeon: Vanga, Rohini Reddy, MD;  Location: [facility];  Service: Gastroenterology;  Laterality: N/A;   DORSAL COMPARTMENT RELEASE Right 12/06/2017   Procedure: RELEASE DORSAL COMPARTMENT (DEQUERVAIN);  Surgeon: Miller, Howard, MD;  Location: [facility];  Service: Orthopedics;  Laterality: Right;   ESOPHAGOGASTRODUODENOSCOPY (EGD) WITH PROPOFOL N/A 06/11/2021   Procedure: ESOPHAGOGASTRODUODENOSCOPY (EGD) WITH PROPOFOL;  Surgeon: Vanga, Rohini Reddy, MD;  Location: [facility];  Service: Gastroenterology;  Laterality: N/A;   PARTIAL HYSTERECTOMY     PORTACATH PLACEMENT N/A 09/24/2021   Procedure: INSERTION PORT-A-CATH;  Surgeon: Cintron-Diaz, Edgardo, MD;  Location: [facility];  Service: General;  Laterality: N/A;   TUBAL LIGATION      SOCIAL HISTORY: Social History   Socioeconomic History   Marital status: Single    Spouse name: Not on file   Number of children: Not on file   Years of education: Not on file   Highest education level: Not on file  Occupational History   Not on file  Tobacco Use   Smoking  status: Former    Types: Cigarettes   Smokeless tobacco: Never  Vaping Use   Vaping Use: Never used  Substance and Sexual Activity   Alcohol use: No   Drug use: No   Sexual activity: Not on file  Other Topics Concern   Not on file  Social History Narrative   Lives alone   Social Determinants of Health   Financial Resource Strain: High Risk (08/15/2021)   Overall Financial Resource Strain (CARDIA)    Difficulty of Paying Living Expenses: Very hard  Food Insecurity: Food Insecurity Present (09/09/2021)   Hunger Vital Sign    Worried About Running Out of Food in the Last Year: Often true    Ran Out of Food in the Last Year: Often true  Transportation Needs: No Transportation Needs (08/15/2021)   PRAPARE - Transportation    Lack of Transportation (Medical): No    Lack of Transportation (Non-Medical): No  Physical Activity: Inactive (08/15/2021)   Exercise Vital Sign    Days of Exercise per Week: 0 days    Minutes of Exercise per Session: 0 min  Stress: Stress Concern Present (08/15/2021)   [employer] - Occupational Stress Questionnaire    Feeling of Stress : Rather much  Social Connections: Socially Isolated (08/15/2021)   Social   Connection and Isolation Panel [NHANES]    Frequency of Communication with Friends and Family: Once a week    Frequency of Social Gatherings with Friends and Family: Once a week    Attends Religious Services: Never    Marine scientist or Organizations: No    Attends Music therapist: Not on file    Marital Status: Never married  Intimate Partner Violence: Not At Risk (08/15/2021)   Humiliation, Afraid, Rape, and Kick questionnaire    Fear of Current or Ex-Partner: No    Emotionally Abused: No    Physically Abused: No    Sexually Abused: No    FAMILY HISTORY: Family History  Problem Relation Age of Onset   Diabetes Mother    Hypertension Mother    Heart failure Mother    Pancreatic cancer Mother 44    Diabetes Father    Hypertension Father    Breast cancer Maternal Aunt    Cervical cancer Maternal Aunt    Lung cancer Son 71    ALLERGIES:  is allergic to bc powder [aspirin-salicylamide-caffeine], gabapentin, hydrocodone-acetaminophen, latex, penicillin g, penicillins, and shellfish allergy.  MEDICATIONS:  Current Outpatient Medications  Medication Sig Dispense Refill   albuterol (VENTOLIN HFA) 108 (90 Base) MCG/ACT inhaler Inhale 2 puffs into the lungs every 6 (six) hours as needed for wheezing or shortness of breath. 18 g 0   dexamethasone (DECADRON) 4 MG tablet Take 2 tablets (8 mg total) by mouth daily. Start the day before Taxotere. Then take daily x 2 days after chemotherapy. 30 tablet 1   diphenoxylate-atropine (LOMOTIL) 2.5-0.025 MG tablet Take 1 tablet by mouth 4 (four) times daily. 60 tablet 1   FLOVENT DISKUS 250 MCG/ACT AEPB Inhale 1 puff into the lungs daily.     fluticasone (FLONASE) 50 MCG/ACT nasal spray Place 2 sprays into both nostrils daily. 15.8 mL 0   lidocaine-prilocaine (EMLA) cream Apply 1 Application topically as needed. 30 g 12   lisinopril-hydrochlorothiazide (ZESTORETIC) 10-12.5 MG tablet Take 1 tablet by mouth daily.     loperamide (IMODIUM) 2 MG capsule Take 2 mg by mouth as needed for diarrhea or loose stools.     loratadine (CLARITIN) 10 MG tablet Take 1 tablet (10 mg total) by mouth daily. 30 tablet 2   magic mouthwash (multi-ingredient) oral suspension Swish and spit 5-10 ml by  mouth 4 times a day as needed 400 mL 1   montelukast (SINGULAIR) 10 MG tablet Take by mouth.     naloxone (NARCAN) nasal spray 4 mg/0.1 mL      ondansetron (ZOFRAN) 8 MG tablet Take 1 tablet (8 mg total) by mouth 2 (two) times daily as needed (Nausea or vomiting). Start on the third day after chemotherapy. 30 tablet 1   oxyCODONE-acetaminophen (PERCOCET/ROXICET) 5-325 MG tablet Take 1 tablet by mouth every 6 (six) hours as needed for pain.     pantoprazole (PROTONIX) 40 MG  tablet Take 40 mg by mouth daily.     potassium chloride SA (KLOR-CON M) 20 MEQ tablet Take 1 tablet (20 mEq total) by mouth 2 (two) times daily. 60 tablet 0   prochlorperazine (COMPAZINE) 10 MG tablet Take 1 tablet (10 mg total) by mouth every 6 (six) hours as needed (Nausea or vomiting). 30 tablet 1   TRULICITY 3 MG/8.6PY SOPN Inject 0.75 mg into the skin once a week. Monday     albuterol (PROVENTIL) (2.5 MG/3ML) 0.083% nebulizer solution Take 3 mLs (2.5 mg total) by nebulization every  4 (four) hours as needed for wheezing or shortness of breath. 75 mL 2   ciprofloxacin (CIPRO) 500 MG tablet Take 1 tablet (500 mg total) by mouth 2 (two) times daily for 7 days. (Patient not taking: Reported on 11/10/2021) 14 tablet 0   magic mouthwash w/lidocaine SOLN Take 5 mLs by mouth 4 (four) times daily as needed for mouth pain. Sig: Swish/Swallow 5-10 ml four times a day as needed. Dispense 480 ml. 1RF (Patient not taking: Reported on 10/28/2021) 480 mL 1   potassium chloride (KLOR-CON M) 10 MEQ tablet Take 2 tablets (20 mEq total) by mouth daily. (Patient not taking: Reported on 11/04/2021) 14 tablet 0   No current facility-administered medications for this visit.    Review of Systems  Constitutional:  Positive for fatigue. Negative for appetite change and unexpected weight change.  HENT:   Positive for sore throat. Negative for mouth sores and trouble swallowing.        Rhinorrhea- mild  Respiratory:  Negative for chest tightness, cough and shortness of breath.   Cardiovascular:  Negative for leg swelling.  Gastrointestinal:  Negative for abdominal pain, constipation, diarrhea, nausea and vomiting.  Genitourinary:  Negative for bladder incontinence and dysuria.   Musculoskeletal:  Negative for flank pain and neck stiffness.  Skin:  Negative for itching, rash and wound.  Neurological:  Negative for dizziness, headaches, light-headedness and numbness.  Hematological:  Negative for adenopathy. Does not  bruise/bleed easily.  Psychiatric/Behavioral:  Negative for confusion, depression and sleep disturbance. The patient is not nervous/anxious.     PHYSICAL EXAMINATION: ECOG PERFORMANCE STATUS: 2 - Symptomatic, <50% confined to bed Vitals:   11/10/21 1448  BP: (!) 101/43  Pulse: 86  Resp: 16  Temp: 97.7 F (36.5 C)  SpO2: 100%   Filed Weights   11/10/21 1448  Weight: 205 lb (93 kg)   Physical Exam Constitutional:      General: She is not in acute distress.    Appearance: She is well-developed. She is not ill-appearing.     Comments: Fatigued appearing  HENT:     Nose: Rhinorrhea present.     Mouth/Throat:     Mouth: Mucous membranes are moist.     Pharynx: No oropharyngeal exudate or posterior oropharyngeal erythema.  Eyes:     General: No scleral icterus. Cardiovascular:     Rate and Rhythm: Normal rate and regular rhythm.  Pulmonary:     Effort: Pulmonary effort is normal.     Breath sounds: Normal breath sounds.  Abdominal:     General: There is no distension.     Palpations: Abdomen is soft.     Tenderness: There is no abdominal tenderness. There is no rebound.  Musculoskeletal:        General: No tenderness or deformity.     Right lower leg: No edema.     Left lower leg: No edema.  Skin:    General: Skin is warm and dry.     Coloration: Skin is not pale.  Neurological:     General: No focal deficit present.     Mental Status: She is alert and oriented to person, place, and time.     Motor: No weakness.  Psychiatric:        Mood and Affect: Mood normal.        Behavior: Behavior normal.    LABORATORY DATA:  I have reviewed the data as listed Lab Results  Component Value Date   WBC 7.2 11/10/2021  HGB 9.1 (L) 11/10/2021   HCT 28.4 (L) 11/10/2021   MCV 86.1 11/10/2021   PLT 188 11/10/2021   Lab Results  Component Value Date   NA 136 11/07/2021   K 3.6 11/07/2021   CL 102 11/07/2021   CO2 26 11/07/2021    RADIOGRAPHIC STUDIES: I have  personally reviewed the radiological images as listed and agreed with the findings in the report. No results found.  ASSESSMENT & PLAN:   Cancer Staging  Breast cancer [facility]) Staging form: Breast, AJCC 8th Edition - Clinical stage from 08/21/2021: Stage IIIB (cT4b, cN3c, cM0, G3, ER-, PR-, HER2+) - Signed by Earlie Server, MD on 09/12/2021  Breast cancer [facility])- Left breast cancer, cT4b N3 Mx, ER/PR-, HER2 +, currently receiving neoadjuvant TCHP chemotherapy with udenyca support. Tolerating moderately - diarrhea (see below) Chemotherapy induced neutropenia- Patient did not receive udenyca on D3 with cycle 3. Plan for labs today, cbc. WBC 1.3, ANC 0.4. Will authorize short acting gcsf 5 mcg/kg/day x 3 days. Recheck counts later this week +/- zarxio and again Monday. Plan to switch back to long acting GCSF for cycle 4. Dr Tasia Catchings recommends empiric coverage with cipro 500 mg BID x 7 days. May need to extend coverage based on ANC. Will follow up with her next week to review.  Anemia due to antineoplastic chemotherapy- hmg 9.2. Monitor.  Thrombophlebitis- symptomatically improved. She will complete eliquis 2.5 mg BID then stop.  Chemotherapy induced diarrhea- Continue imodium and lomotil. She will require electrolyte replacement and hydration intermittently between cycles. She will report worsening symptoms.  Chemotherapy induced nausea- IV zofran today. Continue antiemetics as prescribed. If refractory, consider zyprexa.  Hypomagnesemia- secondary to GI losses. IV magnesium 2 g times daily. Continue checking magnesium levels twice weekly +/- IV magnesium. Today, Mg 2.0.  Sore throat- question chemo induced nausea vs infectious vs esophagitis. Monitor closely. Cipro doesn't have good coverage for strep so if she appears more infectious, may need to adjust antibiotic therapy, consider augmentin. Also encouraged her to take covid test. Infectious precautions reviewed.   All questions were answered. The patient knows  to call the clinic with any problems questions or concerns.  Return of visit:    Beckey Rutter, [place], AGNP-C [place] [facility] [phone] (clinic)

## 2021-11-10 NOTE — Telephone Encounter (Signed)
Patient contacted. She no showed for apt today with Lauren. Pt stated that she got confused- her apts were also sch. [facility]. I asked pt to come now to be evaluated.

## 2021-11-11 ENCOUNTER — Inpatient Hospital Stay: Payer: Medicare HMO | Admitting: Nurse Practitioner

## 2021-11-11 ENCOUNTER — Inpatient Hospital Stay: Payer: Medicare HMO

## 2021-11-11 ENCOUNTER — Other Ambulatory Visit: Payer: Self-pay | Admitting: Oncology

## 2021-11-11 ENCOUNTER — Encounter: Payer: Self-pay | Admitting: Oncology

## 2021-11-11 VITALS — BP 129/89 | HR 74 | Temp 97.7°F

## 2021-11-11 DIAGNOSIS — Z5189 Encounter for other specified aftercare: Secondary | ICD-10-CM | POA: Diagnosis not present

## 2021-11-11 DIAGNOSIS — Z5112 Encounter for antineoplastic immunotherapy: Secondary | ICD-10-CM | POA: Diagnosis not present

## 2021-11-11 DIAGNOSIS — D701 Agranulocytosis secondary to cancer chemotherapy: Secondary | ICD-10-CM | POA: Diagnosis not present

## 2021-11-11 DIAGNOSIS — C50812 Malignant neoplasm of overlapping sites of left female breast: Secondary | ICD-10-CM | POA: Diagnosis not present

## 2021-11-11 DIAGNOSIS — Z5111 Encounter for antineoplastic chemotherapy: Secondary | ICD-10-CM | POA: Diagnosis not present

## 2021-11-11 DIAGNOSIS — T451X5A Adverse effect of antineoplastic and immunosuppressive drugs, initial encounter: Secondary | ICD-10-CM

## 2021-11-11 DIAGNOSIS — K521 Toxic gastroenteritis and colitis: Secondary | ICD-10-CM

## 2021-11-11 DIAGNOSIS — E876 Hypokalemia: Secondary | ICD-10-CM | POA: Diagnosis not present

## 2021-11-11 DIAGNOSIS — C50011 Malignant neoplasm of nipple and areola, right female breast: Secondary | ICD-10-CM

## 2021-11-11 DIAGNOSIS — D6481 Anemia due to antineoplastic chemotherapy: Secondary | ICD-10-CM | POA: Diagnosis not present

## 2021-11-11 MED ORDER — MAGNESIUM SULFATE 2 GM/50ML IV SOLN
2.0000 g | Freq: Once | INTRAVENOUS | Status: AC
  Administered 2021-11-11: 2 g via INTRAVENOUS

## 2021-11-11 MED ORDER — SODIUM CHLORIDE 0.9 % IV SOLN
Freq: Once | INTRAVENOUS | Status: AC
  Filled 2021-11-11: qty 250

## 2021-11-11 MED ORDER — SODIUM CHLORIDE 0.9% FLUSH
10.0000 mL | Freq: Once | INTRAVENOUS | Status: AC | PRN
  Administered 2021-11-11: 10 mL
  Filled 2021-11-11: qty 10

## 2021-11-11 MED ORDER — HEPARIN SOD (PORK) LOCK FLUSH 100 UNIT/ML IV SOLN
500.0000 [IU] | Freq: Once | INTRAVENOUS | Status: AC | PRN
  Administered 2021-11-11: 500 [IU]
  Filled 2021-11-11: qty 5

## 2021-11-14 ENCOUNTER — Inpatient Hospital Stay: Payer: Medicare HMO

## 2021-11-14 ENCOUNTER — Other Ambulatory Visit: Payer: Self-pay

## 2021-11-14 VITALS — BP 119/74 | HR 79

## 2021-11-14 DIAGNOSIS — D701 Agranulocytosis secondary to cancer chemotherapy: Secondary | ICD-10-CM | POA: Diagnosis not present

## 2021-11-14 DIAGNOSIS — C50011 Malignant neoplasm of nipple and areola, right female breast: Secondary | ICD-10-CM

## 2021-11-14 DIAGNOSIS — E876 Hypokalemia: Secondary | ICD-10-CM | POA: Diagnosis not present

## 2021-11-14 DIAGNOSIS — T451X5A Adverse effect of antineoplastic and immunosuppressive drugs, initial encounter: Secondary | ICD-10-CM

## 2021-11-14 DIAGNOSIS — Z5111 Encounter for antineoplastic chemotherapy: Secondary | ICD-10-CM | POA: Diagnosis not present

## 2021-11-14 DIAGNOSIS — C50812 Malignant neoplasm of overlapping sites of left female breast: Secondary | ICD-10-CM | POA: Diagnosis not present

## 2021-11-14 DIAGNOSIS — Z5112 Encounter for antineoplastic immunotherapy: Secondary | ICD-10-CM | POA: Diagnosis not present

## 2021-11-14 DIAGNOSIS — Z5189 Encounter for other specified aftercare: Secondary | ICD-10-CM | POA: Diagnosis not present

## 2021-11-14 DIAGNOSIS — D6481 Anemia due to antineoplastic chemotherapy: Secondary | ICD-10-CM | POA: Diagnosis not present

## 2021-11-14 LAB — BASIC METABOLIC PANEL
Anion gap: 5 (ref 5–15)
BUN: 10 mg/dL (ref 6–20)
CO2: 29 mmol/L (ref 22–32)
Calcium: 8.3 mg/dL — ABNORMAL LOW (ref 8.9–10.3)
Chloride: 106 mmol/L (ref 98–111)
Creatinine, Ser: 0.73 mg/dL (ref 0.44–1.00)
GFR, Estimated: >60 mL/min (ref >60)
Glucose, Bld: 115 mg/dL — ABNORMAL HIGH (ref 70–99)
Potassium: 3.1 mmol/L — ABNORMAL LOW (ref 3.5–5.1)
Sodium: 140 mmol/L (ref 135–145)

## 2021-11-14 LAB — MAGNESIUM: Magnesium: 1.4 mg/dL — ABNORMAL LOW (ref 1.7–2.4)

## 2021-11-14 MED ORDER — SODIUM CHLORIDE 0.9% FLUSH
10.0000 mL | Freq: Once | INTRAVENOUS | Status: AC | PRN
  Administered 2021-11-14: 10 mL
  Filled 2021-11-14: qty 10

## 2021-11-14 MED ORDER — HEPARIN SOD (PORK) LOCK FLUSH 100 UNIT/ML IV SOLN
500.0000 [IU] | Freq: Once | INTRAVENOUS | Status: AC | PRN
  Administered 2021-11-14: 500 [IU]
  Filled 2021-11-14: qty 5

## 2021-11-14 MED ORDER — SODIUM CHLORIDE 0.9 % IV SOLN
Freq: Once | INTRAVENOUS | Status: AC
  Filled 2021-11-14: qty 250

## 2021-11-14 MED ORDER — MAGNESIUM SULFATE 2 GM/50ML IV SOLN
2.0000 g | Freq: Once | INTRAVENOUS | Status: AC
  Administered 2021-11-14: 2 g via INTRAVENOUS
  Filled 2021-11-14: qty 50

## 2021-11-14 MED ORDER — POTASSIUM CHLORIDE 20 MEQ/100ML IV SOLN
20.0000 meq | Freq: Once | INTRAVENOUS | Status: AC
  Administered 2021-11-14: 20 meq via INTRAVENOUS

## 2021-11-18 ENCOUNTER — Ambulatory Visit: Payer: Medicare HMO | Admitting: Oncology

## 2021-11-18 ENCOUNTER — Other Ambulatory Visit: Payer: Medicare HMO

## 2021-11-18 ENCOUNTER — Ambulatory Visit: Payer: Medicare HMO

## 2021-11-18 MED FILL — Dexamethasone Sodium Phosphate Inj 100 MG/10ML: INTRAMUSCULAR | Qty: 1 | Status: AC

## 2021-11-18 MED FILL — Fosaprepitant Dimeglumine For IV Infusion 150 MG (Base Eq): INTRAVENOUS | Qty: 5 | Status: AC

## 2021-11-19 ENCOUNTER — Inpatient Hospital Stay: Payer: Medicare HMO

## 2021-11-19 ENCOUNTER — Ambulatory Visit: Payer: Medicare HMO

## 2021-11-19 ENCOUNTER — Ambulatory Visit: Payer: Medicare HMO | Admitting: Oncology

## 2021-11-19 ENCOUNTER — Inpatient Hospital Stay (HOSPITAL_BASED_OUTPATIENT_CLINIC_OR_DEPARTMENT_OTHER): Payer: Medicare HMO | Admitting: Oncology

## 2021-11-19 ENCOUNTER — Other Ambulatory Visit: Payer: Medicare HMO

## 2021-11-19 ENCOUNTER — Encounter: Payer: Self-pay | Admitting: Oncology

## 2021-11-19 VITALS — BP 109/69 | HR 73 | Temp 96.3°F | Resp 16 | Wt 209.0 lb

## 2021-11-19 DIAGNOSIS — Z5112 Encounter for antineoplastic immunotherapy: Secondary | ICD-10-CM | POA: Diagnosis not present

## 2021-11-19 DIAGNOSIS — C50011 Malignant neoplasm of nipple and areola, right female breast: Secondary | ICD-10-CM

## 2021-11-19 DIAGNOSIS — L271 Localized skin eruption due to drugs and medicaments taken internally: Secondary | ICD-10-CM

## 2021-11-19 DIAGNOSIS — D6481 Anemia due to antineoplastic chemotherapy: Secondary | ICD-10-CM | POA: Diagnosis not present

## 2021-11-19 DIAGNOSIS — Z171 Estrogen receptor negative status [ER-]: Secondary | ICD-10-CM | POA: Diagnosis not present

## 2021-11-19 DIAGNOSIS — T451X5A Adverse effect of antineoplastic and immunosuppressive drugs, initial encounter: Secondary | ICD-10-CM

## 2021-11-19 DIAGNOSIS — I809 Phlebitis and thrombophlebitis of unspecified site: Secondary | ICD-10-CM | POA: Diagnosis not present

## 2021-11-19 DIAGNOSIS — C50812 Malignant neoplasm of overlapping sites of left female breast: Secondary | ICD-10-CM | POA: Diagnosis not present

## 2021-11-19 DIAGNOSIS — Z5111 Encounter for antineoplastic chemotherapy: Secondary | ICD-10-CM | POA: Diagnosis not present

## 2021-11-19 DIAGNOSIS — K521 Toxic gastroenteritis and colitis: Secondary | ICD-10-CM

## 2021-11-19 DIAGNOSIS — D701 Agranulocytosis secondary to cancer chemotherapy: Secondary | ICD-10-CM | POA: Diagnosis not present

## 2021-11-19 DIAGNOSIS — E876 Hypokalemia: Secondary | ICD-10-CM | POA: Diagnosis not present

## 2021-11-19 DIAGNOSIS — Z5189 Encounter for other specified aftercare: Secondary | ICD-10-CM | POA: Diagnosis not present

## 2021-11-19 LAB — CBC WITH DIFFERENTIAL/PLATELET
Abs Immature Granulocytes: 0 10*3/uL (ref 0.00–0.07)
Basophils Absolute: 0 10*3/uL (ref 0.0–0.1)
Basophils Relative: 1 %
Eosinophils Absolute: 0 10*3/uL (ref 0.0–0.5)
Eosinophils Relative: 0 %
HCT: 26.2 % — ABNORMAL LOW (ref 36.0–46.0)
Hemoglobin: 8.5 g/dL — ABNORMAL LOW (ref 12.0–15.0)
Immature Granulocytes: 0 %
Lymphocytes Relative: 55 %
Lymphs Abs: 1.4 10*3/uL (ref 0.7–4.0)
MCH: 28.2 pg (ref 26.0–34.0)
MCHC: 32.4 g/dL (ref 30.0–36.0)
MCV: 87 fL (ref 80.0–100.0)
Monocytes Absolute: 0.2 10*3/uL (ref 0.1–1.0)
Monocytes Relative: 8 %
Neutro Abs: 0.9 10*3/uL — ABNORMAL LOW (ref 1.7–7.7)
Neutrophils Relative %: 36 %
Platelets: 74 10*3/uL — ABNORMAL LOW (ref 150–400)
RBC: 3.01 MIL/uL — ABNORMAL LOW (ref 3.87–5.11)
RDW: 18.7 % — ABNORMAL HIGH (ref 11.5–15.5)
WBC: 2.5 10*3/uL — ABNORMAL LOW (ref 4.0–10.5)
nRBC: 0 % (ref 0.0–0.2)

## 2021-11-19 LAB — COMPREHENSIVE METABOLIC PANEL
ALT: 13 U/L (ref 0–44)
AST: 18 U/L (ref 15–41)
Albumin: 3.6 g/dL (ref 3.5–5.0)
Alkaline Phosphatase: 80 U/L (ref 38–126)
Anion gap: 5 (ref 5–15)
BUN: 11 mg/dL (ref 6–20)
CO2: 27 mmol/L (ref 22–32)
Calcium: 8.7 mg/dL — ABNORMAL LOW (ref 8.9–10.3)
Chloride: 108 mmol/L (ref 98–111)
Creatinine, Ser: 0.72 mg/dL (ref 0.44–1.00)
GFR, Estimated: >60 mL/min (ref >60)
Glucose, Bld: 102 mg/dL — ABNORMAL HIGH (ref 70–99)
Potassium: 3.3 mmol/L — ABNORMAL LOW (ref 3.5–5.1)
Sodium: 140 mmol/L (ref 135–145)
Total Bilirubin: 0.4 mg/dL (ref 0.3–1.2)
Total Protein: 6.6 g/dL (ref 6.5–8.1)

## 2021-11-19 LAB — MAGNESIUM: Magnesium: 1.6 mg/dL — ABNORMAL LOW (ref 1.7–2.4)

## 2021-11-19 MED ORDER — LOPERAMIDE HCL 2 MG PO CAPS: 2.0000 mg | ORAL_CAPSULE | ORAL | 1 refills | Status: DC

## 2021-11-19 MED ORDER — MAGNESIUM SULFATE 2 GM/50ML IV SOLN
2.0000 g | Freq: Once | INTRAVENOUS | Status: AC
  Administered 2021-11-19: 2 g via INTRAVENOUS
  Filled 2021-11-19: qty 50

## 2021-11-19 MED ORDER — POTASSIUM CHLORIDE 20 MEQ/100ML IV SOLN
20.0000 meq | Freq: Once | INTRAVENOUS | Status: AC
  Administered 2021-11-19: 20 meq via INTRAVENOUS

## 2021-11-19 MED ORDER — DIPHENOXYLATE-ATROPINE 2.5-0.025 MG PO TABS: 1.0000 | ORAL_TABLET | Freq: Four times a day (QID) | ORAL | 1 refills | Status: DC

## 2021-11-19 MED ORDER — HEPARIN SOD (PORK) LOCK FLUSH 100 UNIT/ML IV SOLN
500.0000 [IU] | Freq: Once | INTRAVENOUS | Status: AC
  Filled 2021-11-19: qty 5

## 2021-11-19 MED ORDER — POTASSIUM CHLORIDE CRYS ER 20 MEQ PO TBCR: 20.0000 meq | EXTENDED_RELEASE_TABLET | Freq: Two times a day (BID) | ORAL | 1 refills | Status: DC

## 2021-11-19 MED ORDER — HEPARIN SOD (PORK) LOCK FLUSH 100 UNIT/ML IV SOLN
INTRAVENOUS | Status: AC
  Administered 2021-11-19: 500 [IU] via INTRAVENOUS
  Filled 2021-11-19: qty 5

## 2021-11-19 MED ORDER — SODIUM CHLORIDE 0.9 % IV SOLN
INTRAVENOUS | Status: DC
  Filled 2021-11-19: qty 250

## 2021-11-19 NOTE — Progress Notes (Signed)
Pt in for follow up, pt reports had loose stools for last 2 days using imodium and lomotil and denies any today. Pt reports she started noticing some "red spots and bleeding on finger tips bilaterally. ".

## 2021-11-19 NOTE — Assessment & Plan Note (Signed)
stop Eliquis 2.'5mg'$  BID

## 2021-11-19 NOTE — Assessment & Plan Note (Signed)
Likely due to taxing treatments. Continue to apply moisturizers and topical Neosporin.

## 2021-11-19 NOTE — Assessment & Plan Note (Signed)
Continue oral Slow Mag.  IV Mag as needed twice weekly.

## 2021-11-19 NOTE — Assessment & Plan Note (Signed)
Recommend patient continue potassium and chloride 35mq twice daily. IV potassium chloride 297m today.

## 2021-11-19 NOTE — Assessment & Plan Note (Signed)
Treatment plan as listed above. 

## 2021-11-19 NOTE — Progress Notes (Signed)
Hematology/Oncology Progress note [phone] [phone]         Patient Care Team: [place] as PCP - General Daiva Huge, RN as Oncology Nurse Navigator  REFERRING PROVIDER: Earlie Server, MD   ASSESSMENT & PLAN:   Cancer Staging  Breast cancer [facility]) Staging form: Breast, AJCC 8th Edition - Clinical stage from 08/21/2021: Stage IIIB (cT4b, cN3c, cM0, G3, ER-, PR-, HER2+) - Signed by Earlie Server, MD on 09/12/2021  Breast cancer ([place]) Left breast cancer, cT4b N3 Mx, ER/PR-, HER2 + Overall she tolerates chemotherapy except diarrhea. Clinically left breast mass has responded to treatment. Shared decision was made to stay on current regimen with [facility] and continue rigorous supportive care.Labs are reviewed and discussed with patient. Hold chemotherapy TCHP, D3 Udenyca due to neutropenia Reassess in 1 week ANC 0.9, patient is afebrile.  Discussed with patient neutropenic precaution.  Close monitor  Encounter for antineoplastic chemotherapy Treatment plan as listed above  Anemia due to antineoplastic chemotherapy hemoglobin has decreased as expected.  Monitor counts.   Hypomagnesemia Continue oral Slow Mag.  IV Mag as needed twice weekly.    Chemotherapy induced diarrhea Recommend patient to continue utilize Imodium and Lomotil. Continue regular supportive care with hydration session twice a per week.   Thrombophlebitis stop Eliquis 2.75m BID    Palmar plantar erythrodysesthesia Likely due to taxing treatments. Continue to apply moisturizers and topical Neosporin.  Hypokalemia Recommend patient continue potassium and chloride 22m twice daily. IV potassium chloride 2049mtoday.     Orders Placed This Encounter  Procedures   CBC with Differential    Standing Status:   Future    Standing Expiration Date:   11/27/2022   Comprehensive metabolic panel    Standing Status:   Future    Standing Expiration Date:   11/27/2022     All  questions were answered. The patient knows to call the clinic with any problems questions or concerns.  Return of visit:  Hold chemo IV Mag and K today 1 week Lab MD TCHP w D3 udeSula RumpleD, PhD [facility] 11/19/2021     CHIEF COMPLAINTS/REASON FOR VISIT:  Follow-up for breast cancer  HISTORY OF PRESENTING ILLNESS:  Vanessa Fisher a  [age]o.  female with PMH listed below who was referred to me for evaluation of  left breast mass SUMMARY OF ONCOLOGIC HISTORY: Oncology History  [place] [facility]   Bilateral diagnostic mammogram and US [place]owed 5 centimeter LEFT breast mass associated with pleomorphic calcifications is suspicious for invasive ductal carcinoma.At least 4 LEFT axillary lymph nodes with abnormal morphology   08/21/2021 Cancer Staging   Staging form: Breast, AJCC 8th Edition - Clinical: Stage IIIB (cT4b, cN3c, cM0, G3, ER-, PR-, HER2+) - Signed by Burnie Hank,Earlie ServerD on 08/28/2021 Histologic grading system: 3 grade system  -08/21/21 left breast ultrasound-guided biopsy showed invasive mammary carcinoma, grade 3, ER/PR negative, HER2 positive.  Left axillary lymph node biopsy positive for macro metastatic mammary carcinoma, 8 mm in greatest extent.   08/30/2021 Imaging   MRI brain w wo contrast -No metastatic disease or acute intracranial abnormality. Essentially normal for age MRI appearance of the brain.    09/03/2021 Echocardiogram   1. Left ventricular ejection fraction, by estimation, is 55 to 60%. Left ventricular ejection fraction by 3D volume is 58 %. The left ventricle has normal function. The left ventricle has no regional wall motion abnormalities. There is mild left  ventricular hypertrophy. Left ventricular diastolic parameters were normal.  2. Right ventricular systolic function is normal. The right ventricular size is normal.  3. The mitral valve is normal in structure. No evidence of mitral valve  regurgitation.  4. The aortic valve was not well visualized. Aortic valve regurgitation is not visualized   09/10/2021 Imaging   PET scan showed Large hypermetabolic left breast mass and diffuse skin thickening,consistent with primary breast carcinoma. Hypermetabolic lymphadenopathy in the left axilla, left subpectoral region, and left supraclavicular region, consistent with metastatic disease. No evidence of metastatic disease within the abdomen or pelvis   09/12/2021 -  Chemotherapy   Patient is on Treatment Plan : BREAST  Docetaxel + Carboplatin + Trastuzumab + Pertuzumab  (TCHP) q21d       Genetic Testing   Negative genetic testing. No pathogenic variants identified on the [facility] Common Hereditary Cancers+RNA panel. The report date is 10/26/2021.  The Common Hereditary Cancers Panel + RNA offered by [facility] includes sequencing and/or deletion duplication testing of the following 47 genes: APC, ATM, AXIN2, BARD1, BMPR1A, BRCA1, BRCA2, BRIP1, CDH1, CDKN2A (p14ARF), CDKN2A (p16INK4a), CKD4, CHEK2, CTNNA1, DICER1, EPCAM (Deletion/duplication testing only), GREM1 (promoter region deletion/duplication testing only), KIT, MEN1, MLH1, MSH2, MSH3, MSH6, MUTYH, NBN, NF1, NHTL1, PALB2, PDGFRA, PMS2, POLD1, POLE, PTEN, RAD50, RAD51C, RAD51D, SDHB, SDHC, SDHD, SMAD4, SMARCA4. STK11, TP53, TSC1, TSC2, and VHL.  The following genes were evaluated for sequence changes only: SDHA and HOXB13 c.251G>A variant only.     INTERVAL HISTORY Vanessa Fisher is a 60 y.o. female who has above history reviewed by me today presents for follow up visit for Triple positive breast cancer treatment.  Currently she is on neoadjuvant TCHP with GCSF support, overall she tolerates moderate difficulties. Due to chemotherapy induced diarrhea, she takes Lomotil and Imodium, with multiple care including IV electrolytes and IV fluid hydration sessions.  And diarrhea is manageable.  She denies any diarrhea today.  Denies any fever  or chills. Left hand superficial thrombophlebitis, resolved. Patient has had fingertip soreness and blisters with mild bleeding intermittently.Marland Kitchen  Utilize Eucerin and Neosporin cream.  MEDICAL HISTORY:  Past Medical History:  Diagnosis Date   Anemia    Anxiety    Arthritis    Asthma    WELL CONTROLLED   Bile acid malabsorption syndrome    Bradycardia    HAD AN ISSUE WITH THIS IN 2016-NO PROBLEMS SINCE   Breast cancer, left breast ([place]) 08/2021   Carpal tunnel syndrome on right    Depression    Diabetes mellitus without complication (HCC)    Diverticulitis    Gastric reflux    GERD (gastroesophageal reflux disease)    Hand pain 05/12/2016   Headache    H/O MIGRAINES   History of kidney stones    H/O   Lumbar radiculitis    Neck pain, chronic    Osteoarthritis of both knees    Panic attacks     SURGICAL HISTORY: Past Surgical History:  Procedure Laterality Date   BREAST BIOPSY Left 08/21/2021   Axilla Bx, Hydromarker, path pending   BREAST BIOPSY Left 08/21/2021   [place] Bx, Ribbon Clip, Path Pending   CARPAL TUNNEL RELEASE Right 12/06/2017   Procedure: CARPAL TUNNEL RELEASE;  Surgeon: Earnestine Leys, MD;  Location: [facility] ORS;  Service: Orthopedics;  Laterality: Right;   COLONOSCOPY WITH PROPOFOL N/A 06/11/2021   Procedure: COLONOSCOPY WITH PROPOFOL;  Surgeon: Lin Landsman, MD;  Location: [facility] ENDOSCOPY;  Service: Gastroenterology;  Laterality: N/A;  DORSAL COMPARTMENT RELEASE Right 12/06/2017   Procedure: RELEASE DORSAL COMPARTMENT (DEQUERVAIN);  Surgeon: Earnestine Leys, MD;  Location: [facility] ORS;  Service: Orthopedics;  Laterality: Right;   ESOPHAGOGASTRODUODENOSCOPY (EGD) WITH PROPOFOL N/A 06/11/2021   Procedure: ESOPHAGOGASTRODUODENOSCOPY (EGD) WITH PROPOFOL;  Surgeon: Lin Landsman, MD;  Location: [facility] ENDOSCOPY;  Service: Gastroenterology;  Laterality: N/A;   PARTIAL HYSTERECTOMY     PORTACATH PLACEMENT N/A 09/24/2021   Procedure: INSERTION PORT-A-CATH;   Surgeon: Herbert Pun, MD;  Location: [facility] ORS;  Service: General;  Laterality: N/A;   TUBAL LIGATION      SOCIAL HISTORY: Social History   Socioeconomic History   Marital status: Single    Spouse name: Not on file   Number of children: Not on file   Years of education: Not on file   Highest education level: Not on file  Occupational History   Not on file  Tobacco Use   Smoking status: Former    Types: Cigarettes   Smokeless tobacco: Never  Vaping Use   Vaping Use: Never used  Substance and Sexual Activity   Alcohol use: No   Drug use: No   Sexual activity: Not on file  Other Topics Concern   Not on file  Social History Narrative   Lives alone   Social Determinants of Health   Financial Resource Strain: High Risk (08/15/2021)   Overall Financial Resource Strain (CARDIA)    Difficulty of Paying Living Expenses: Very hard  Food Insecurity: Food Insecurity Present (09/09/2021)   Hunger Vital Sign    Worried About Running Out of Food in the Last Year: Often true    Ran Out of Food in the Last Year: Often true  Transportation Needs: No Transportation Needs (08/15/2021)   PRAPARE - Hydrologist (Medical): No    Lack of Transportation (Non-Medical): No  Physical Activity: Inactive (08/15/2021)   Exercise Vital Sign    Days of Exercise per Week: 0 days    Minutes of Exercise per Session: 0 min  Stress: Stress Concern Present (08/15/2021)   [place]    Feeling of Stress : Rather much  Social Connections: Socially Isolated (08/15/2021)   Social Connection and Isolation Panel [NHANES]    Frequency of Communication with Friends and Family: Once a week    Frequency of Social Gatherings with Friends and Family: Once a week    Attends Religious Services: Never    Marine scientist or Organizations: No    Attends Music therapist: Not on file    Marital Status:  Never married  Intimate Partner Violence: Not At Risk (08/15/2021)   Humiliation, Afraid, Rape, and Kick questionnaire    Fear of Current or Ex-Partner: No    Emotionally Abused: No    Physically Abused: No    Sexually Abused: No    FAMILY HISTORY: Family History  Problem Relation Age of Onset   Diabetes Mother    Hypertension Mother    Heart failure Mother    Pancreatic cancer Mother 64   Diabetes Father    Hypertension Father    Breast cancer Maternal Aunt    Cervical cancer Maternal Aunt    Lung cancer Son 1    ALLERGIES:  is allergic to bc powder [aspirin-salicylamide-caffeine], gabapentin, hydrocodone-acetaminophen, latex, penicillin g, penicillins, and shellfish allergy.  MEDICATIONS:  Current Outpatient Medications  Medication Sig Dispense Refill   albuterol (PROVENTIL) (2.5 MG/3ML) 0.083% nebulizer solution Take  3 mLs (2.5 mg total) by nebulization every 4 (four) hours as needed for wheezing or shortness of breath. 75 mL 2   albuterol (VENTOLIN HFA) 108 (90 Base) MCG/ACT inhaler Inhale 2 puffs into the lungs every 6 (six) hours as needed for wheezing or shortness of breath. 18 g 0   dexamethasone (DECADRON) 4 MG tablet Take 2 tablets (8 mg total) by mouth daily. Start the day before Taxotere. Then take daily x 2 days after chemotherapy. 30 tablet 1   FLOVENT DISKUS 250 MCG/ACT AEPB Inhale 1 puff into the lungs daily.     fluticasone (FLONASE) 50 MCG/ACT nasal spray Place 2 sprays into both nostrils daily. 15.8 mL 0   lidocaine-prilocaine (EMLA) cream Apply 1 Application topically as needed. 30 g 12   lisinopril-hydrochlorothiazide (ZESTORETIC) 10-12.5 MG tablet Take 1 tablet by mouth daily.     loperamide (IMODIUM) 2 MG capsule Take 1 capsule (2 mg total) by mouth See admin instructions. Initial: 4 mg,the 2 mg every 2 hours (4 mg every 4 hours at night)  maximum: 16 mg/day 90 capsule 1   loratadine (CLARITIN) 10 MG tablet Take 1 tablet (10 mg total) by mouth daily. 30  tablet 2   magic mouthwash (multi-ingredient) oral suspension Swish and spit 5-10 ml by  mouth 4 times a day as needed 400 mL 1   magic mouthwash w/lidocaine SOLN Take 5 mLs by mouth 4 (four) times daily as needed for mouth pain. Sig: Swish/Swallow 5-10 ml four times a day as needed. Dispense 480 ml. 1RF 480 mL 1   montelukast (SINGULAIR) 10 MG tablet Take by mouth.     naloxone (NARCAN) nasal spray 4 mg/0.1 mL      ondansetron (ZOFRAN) 8 MG tablet Take 1 tablet (8 mg total) by mouth 2 (two) times daily as needed (Nausea or vomiting). Start on the third day after chemotherapy. 30 tablet 1   oxyCODONE-acetaminophen (PERCOCET/ROXICET) 5-325 MG tablet Take 1 tablet by mouth every 6 (six) hours as needed for pain.     pantoprazole (PROTONIX) 40 MG tablet Take 40 mg by mouth daily.     prochlorperazine (COMPAZINE) 10 MG tablet Take 1 tablet (10 mg total) by mouth every 6 (six) hours as needed (Nausea or vomiting). 30 tablet 1   TRULICITY 3 ZO/1.0RU SOPN Inject 0.75 mg into the skin once a week. Monday     diphenoxylate-atropine (LOMOTIL) 2.5-0.025 MG tablet Take 1 tablet by mouth 4 (four) times daily. 60 tablet 1   potassium chloride SA (KLOR-CON M) 20 MEQ tablet Take 1 tablet (20 mEq total) by mouth 2 (two) times daily. 60 tablet 1   No current facility-administered medications for this visit.   Facility-Administered Medications Ordered in Other Visits  Medication Dose Route Frequency Provider Last Rate Last Admin   0.9 %  sodium chloride infusion   Intravenous Continuous Earlie Server, MD   Stopped at 11/19/21 1119    Review of Systems  Constitutional:  Negative for appetite change, chills, fatigue and fever.  HENT:   Negative for hearing loss and voice change.   Eyes:  Negative for eye problems.  Respiratory:  Negative for chest tightness and cough.   Cardiovascular:  Negative for chest pain.  Gastrointestinal:  Negative for abdominal distention, abdominal pain and blood in stool.  Endocrine:  Negative for hot flashes.  Genitourinary:  Negative for difficulty urinating and frequency.   Musculoskeletal:  Positive for arthralgias and back pain.  Skin:  Negative for itching and  rash.  Neurological:  Negative for extremity weakness.  Hematological:  Negative for adenopathy.  Psychiatric/Behavioral:  Negative for confusion.   Left breast pain  PHYSICAL EXAMINATION: ECOG PERFORMANCE STATUS: 1 - Symptomatic but completely ambulatory Vitals:   11/19/21 0908  BP: 109/69  Pulse: 73  Resp: 16  Temp: (!) 96.3 F (35.7 C)  SpO2: 100%   Filed Weights   11/19/21 0908  Weight: 209 lb (94.8 kg)   Physical Exam Constitutional:      General: She is not in acute distress.    Appearance: She is not diaphoretic.  HENT:     Head: Normocephalic and atraumatic.     Nose: Nose normal.     Mouth/Throat:     Pharynx: No oropharyngeal exudate.  Eyes:     General: No scleral icterus.    Pupils: Pupils are equal, round, and reactive to light.  Cardiovascular:     Rate and Rhythm: Normal rate and regular rhythm.     Heart sounds: No murmur heard. Pulmonary:     Effort: Pulmonary effort is normal. No respiratory distress.     Breath sounds: No rales.  Chest:     Chest wall: No tenderness.  Abdominal:     General: There is no distension.     Palpations: Abdomen is soft.     Tenderness: There is no abdominal tenderness.  Musculoskeletal:        General: Normal range of motion.     Cervical back: Normal range of motion and neck supple.  Skin:    General: Skin is warm and dry.     Findings: No erythema.     Comments: Bilateral finger tip erythematous with small red papules and some desquamation  Neurological:     Mental Status: She is alert and oriented to person, place, and time.     Cranial Nerves: No cranial nerve deficit.     Motor: No abnormal muscle tone.     Coordination: Coordination normal.  Psychiatric:        Mood and Affect: Affect normal.   Left breast mass breast  creased in size.  LABORATORY DATA:  I have reviewed the data as listed    Latest Ref Rng & Units 11/19/2021    8:42 AM 11/10/2021    2:34 PM 11/07/2021    9:02 AM  CBC  WBC 4.0 - 10.5 K/uL 2.5  7.2  2.6   Hemoglobin 12.0 - 15.0 g/dL 8.5  9.1  9.4   Hematocrit 36.0 - 46.0 % 26.2  28.4  28.4   Platelets 150 - 400 K/uL 74  188  231       Latest Ref Rng & Units 11/19/2021    8:42 AM 11/14/2021    9:57 AM 11/10/2021    2:34 PM  CMP  Glucose 70 - 99 mg/dL 102  115  109   BUN 6 - 20 mg/dL _0 Creatinine 0.44 - 1.00 mg/dL 0.72  0.73  0.81   Sodium 135 - 145 mmol/L 140  140  139   Potassium 3.5 - 5.1 mmol/L 3.3  3.1  3.6   Chloride 98 - 111 mmol/L 108  106  107   CO2 22 - 32 mmol/L _1 Calcium 8.9 - 10.3 mg/dL 8.7  8.3  9.0   Total Protein 6.5 - 8.1 g/dL 6.6   6.8   Total Bilirubin 0.3 - 1.2 mg/dL 0.4   0.3  Alkaline Phos 38 - 126 U/L 80   97   AST 15 - 41 U/L 18   21   ALT 0 - 44 U/L 13   17       RADIOGRAPHIC STUDIES: I have personally reviewed the radiological images as listed and agreed with the findings in the report. No results found.

## 2021-11-19 NOTE — Progress Notes (Signed)
Nutrition Follow-up:  Patient with left breast cancer.  Patient receiving chemotherapy.  Met with patient during infusion.  Treatment on hold today.  Says that her appetite has been good.  Having about 1 episode of diarrhea per day.  Eating meats, vegetables.  Drinking water, sprite and gingerale. Drinking protein shake   Medications: reviewed, trulicity could be effecting intake as well  Labs: reviewed  Anthropometrics:   Weight 209 lb 216 lb 9/6 240 lb on 3/16   NUTRITION DIAGNOSIS: Inadequate oral intake improving   INTERVENTION:  Encouraged patient to continue getting good sources of protein at every meal. Continue oral nutrition supplement    MONITORING, EVALUATION, GOAL: weight trends, intake   NEXT VISIT: Wednesday, Nov 15 during infusion  Zakkery Dorian B. Zenia Resides, [place], [place] Registered Dietitian [phone]

## 2021-11-19 NOTE — Assessment & Plan Note (Addendum)
Left breast cancer, cT4b N3 Mx, ER/PR-, HER2 + Overall she tolerates chemotherapy except diarrhea. Clinically left breast mass has responded to treatment. Shared decision was made to stay on current regimen with [facility] and continue rigorous supportive care.Labs are reviewed and discussed with patient. Hold chemotherapy TCHP, D3 Udenyca due to neutropenia Reassess in 1 week ANC 0.9, patient is afebrile.  Discussed with patient neutropenic precaution.  Close monitor

## 2021-11-19 NOTE — Assessment & Plan Note (Signed)
Recommend patient to continue utilize Imodium and Lomotil. Continue regular supportive care with hydration session twice a per week.

## 2021-11-19 NOTE — Assessment & Plan Note (Signed)
hemoglobin has decreased as expected.  Monitor counts.  

## 2021-11-21 ENCOUNTER — Ambulatory Visit: Payer: Medicare HMO

## 2021-11-21 ENCOUNTER — Inpatient Hospital Stay: Payer: Medicare HMO

## 2021-11-25 MED FILL — Dexamethasone Sodium Phosphate Inj 100 MG/10ML: INTRAMUSCULAR | Qty: 1 | Status: AC

## 2021-11-25 MED FILL — Fosaprepitant Dimeglumine For IV Infusion 150 MG (Base Eq): INTRAVENOUS | Qty: 5 | Status: AC

## 2021-11-26 ENCOUNTER — Encounter: Payer: Self-pay | Admitting: Licensed Clinical Social Worker

## 2021-11-26 ENCOUNTER — Inpatient Hospital Stay: Payer: Medicare HMO

## 2021-11-26 ENCOUNTER — Inpatient Hospital Stay (HOSPITAL_BASED_OUTPATIENT_CLINIC_OR_DEPARTMENT_OTHER): Payer: Medicare HMO | Admitting: Oncology

## 2021-11-26 ENCOUNTER — Encounter: Payer: Self-pay | Admitting: Oncology

## 2021-11-26 DIAGNOSIS — R519 Headache, unspecified: Secondary | ICD-10-CM | POA: Insufficient documentation

## 2021-11-26 DIAGNOSIS — L271 Localized skin eruption due to drugs and medicaments taken internally: Secondary | ICD-10-CM

## 2021-11-26 DIAGNOSIS — Z171 Estrogen receptor negative status [ER-]: Secondary | ICD-10-CM

## 2021-11-26 DIAGNOSIS — Z5189 Encounter for other specified aftercare: Secondary | ICD-10-CM | POA: Diagnosis not present

## 2021-11-26 DIAGNOSIS — Z599 Problem related to housing and economic circumstances, unspecified: Secondary | ICD-10-CM | POA: Insufficient documentation

## 2021-11-26 DIAGNOSIS — C50011 Malignant neoplasm of nipple and areola, right female breast: Secondary | ICD-10-CM

## 2021-11-26 DIAGNOSIS — D701 Agranulocytosis secondary to cancer chemotherapy: Secondary | ICD-10-CM | POA: Diagnosis not present

## 2021-11-26 DIAGNOSIS — K521 Toxic gastroenteritis and colitis: Secondary | ICD-10-CM

## 2021-11-26 DIAGNOSIS — D6481 Anemia due to antineoplastic chemotherapy: Secondary | ICD-10-CM | POA: Diagnosis not present

## 2021-11-26 DIAGNOSIS — T451X5A Adverse effect of antineoplastic and immunosuppressive drugs, initial encounter: Secondary | ICD-10-CM

## 2021-11-26 DIAGNOSIS — C50812 Malignant neoplasm of overlapping sites of left female breast: Secondary | ICD-10-CM | POA: Diagnosis not present

## 2021-11-26 DIAGNOSIS — Z5111 Encounter for antineoplastic chemotherapy: Secondary | ICD-10-CM

## 2021-11-26 DIAGNOSIS — Z5112 Encounter for antineoplastic immunotherapy: Secondary | ICD-10-CM | POA: Diagnosis not present

## 2021-11-26 DIAGNOSIS — G894 Chronic pain syndrome: Secondary | ICD-10-CM | POA: Diagnosis not present

## 2021-11-26 DIAGNOSIS — E876 Hypokalemia: Secondary | ICD-10-CM

## 2021-11-26 LAB — COMPREHENSIVE METABOLIC PANEL
ALT: 16 U/L (ref 0–44)
AST: 19 U/L (ref 15–41)
Albumin: 3.7 g/dL (ref 3.5–5.0)
Alkaline Phosphatase: 97 U/L (ref 38–126)
Anion gap: 7 (ref 5–15)
BUN: 16 mg/dL (ref 6–20)
CO2: 24 mmol/L (ref 22–32)
Calcium: 8.9 mg/dL (ref 8.9–10.3)
Chloride: 107 mmol/L (ref 98–111)
Creatinine, Ser: 0.9 mg/dL (ref 0.44–1.00)
GFR, Estimated: >60 mL/min (ref >60)
Glucose, Bld: 142 mg/dL — ABNORMAL HIGH (ref 70–99)
Potassium: 4.3 mmol/L (ref 3.5–5.1)
Sodium: 138 mmol/L (ref 135–145)
Total Bilirubin: 0.3 mg/dL (ref 0.3–1.2)
Total Protein: 7.2 g/dL (ref 6.5–8.1)

## 2021-11-26 LAB — CBC WITH DIFFERENTIAL/PLATELET
Abs Immature Granulocytes: 0.01 10*3/uL (ref 0.00–0.07)
Basophils Absolute: 0 10*3/uL (ref 0.0–0.1)
Basophils Relative: 0 %
Eosinophils Absolute: 0 10*3/uL (ref 0.0–0.5)
Eosinophils Relative: 0 %
HCT: 28.5 % — ABNORMAL LOW (ref 36.0–46.0)
Hemoglobin: 9.1 g/dL — ABNORMAL LOW (ref 12.0–15.0)
Immature Granulocytes: 0 %
Lymphocytes Relative: 19 %
Lymphs Abs: 0.6 10*3/uL — ABNORMAL LOW (ref 0.7–4.0)
MCH: 28.3 pg (ref 26.0–34.0)
MCHC: 31.9 g/dL (ref 30.0–36.0)
MCV: 88.5 fL (ref 80.0–100.0)
Monocytes Absolute: 0.1 10*3/uL (ref 0.1–1.0)
Monocytes Relative: 3 %
Neutro Abs: 2.6 10*3/uL (ref 1.7–7.7)
Neutrophils Relative %: 78 %
Platelets: 461 10*3/uL — ABNORMAL HIGH (ref 150–400)
RBC: 3.22 MIL/uL — ABNORMAL LOW (ref 3.87–5.11)
RDW: 19.1 % — ABNORMAL HIGH (ref 11.5–15.5)
WBC: 3.3 10*3/uL — ABNORMAL LOW (ref 4.0–10.5)
nRBC: 0 % (ref 0.0–0.2)

## 2021-11-26 LAB — MAGNESIUM: Magnesium: 1.7 mg/dL (ref 1.7–2.4)

## 2021-11-26 MED ORDER — PALONOSETRON HCL INJECTION 0.25 MG/5ML
0.2500 mg | Freq: Once | INTRAVENOUS | Status: AC
  Administered 2021-11-26: 0.25 mg via INTRAVENOUS
  Filled 2021-11-26: qty 5

## 2021-11-26 MED ORDER — DIPHENHYDRAMINE HCL 25 MG PO CAPS
50.0000 mg | ORAL_CAPSULE | Freq: Once | ORAL | Status: AC
  Administered 2021-11-26: 50 mg via ORAL
  Filled 2021-11-26: qty 2

## 2021-11-26 MED ORDER — SODIUM CHLORIDE 0.9% FLUSH
10.0000 mL | Freq: Once | INTRAVENOUS | Status: AC | PRN
  Administered 2021-11-26: 10 mL
  Filled 2021-11-26: qty 10

## 2021-11-26 MED ORDER — SODIUM CHLORIDE 0.9 % IV SOLN
420.0000 mg | Freq: Once | INTRAVENOUS | Status: AC
  Administered 2021-11-26: 420 mg via INTRAVENOUS
  Filled 2021-11-26: qty 14

## 2021-11-26 MED ORDER — SODIUM CHLORIDE 0.9 % IV SOLN
10.0000 mg | Freq: Once | INTRAVENOUS | Status: AC
  Administered 2021-11-26: 10 mg via INTRAVENOUS
  Filled 2021-11-26: qty 10
  Filled 2021-11-26: qty 1

## 2021-11-26 MED ORDER — SODIUM CHLORIDE 0.9 % IV SOLN
Freq: Once | INTRAVENOUS | Status: AC
  Filled 2021-11-26: qty 250

## 2021-11-26 MED ORDER — ACETAMINOPHEN 325 MG PO TABS
650.0000 mg | ORAL_TABLET | Freq: Once | ORAL | Status: AC
  Administered 2021-11-26: 650 mg via ORAL
  Filled 2021-11-26: qty 2

## 2021-11-26 MED ORDER — TRASTUZUMAB-ANNS CHEMO 150 MG IV SOLR
6.0000 mg/kg | Freq: Once | INTRAVENOUS | Status: AC
  Administered 2021-11-26: 588 mg via INTRAVENOUS
  Filled 2021-11-26: qty 28

## 2021-11-26 MED ORDER — SODIUM CHLORIDE 0.9 % IV SOLN
774.6000 mg | Freq: Once | INTRAVENOUS | Status: AC
  Administered 2021-11-26: 770 mg via INTRAVENOUS
  Filled 2021-11-26: qty 77

## 2021-11-26 MED ORDER — MONTELUKAST SODIUM 10 MG PO TABS
10.0000 mg | ORAL_TABLET | Freq: Once | ORAL | Status: AC
  Administered 2021-11-26: 10 mg via ORAL
  Filled 2021-11-26: qty 1

## 2021-11-26 MED ORDER — SODIUM CHLORIDE 0.9 % IV SOLN
75.0000 mg/m2 | Freq: Once | INTRAVENOUS | Status: AC
  Administered 2021-11-26: 160 mg via INTRAVENOUS
  Filled 2021-11-26: qty 16

## 2021-11-26 MED ORDER — HEPARIN SOD (PORK) LOCK FLUSH 100 UNIT/ML IV SOLN
500.0000 [IU] | Freq: Once | INTRAVENOUS | Status: AC | PRN
  Administered 2021-11-26: 500 [IU]
  Filled 2021-11-26: qty 5

## 2021-11-26 MED ORDER — SODIUM CHLORIDE 0.9 % IV SOLN
150.0000 mg | Freq: Once | INTRAVENOUS | Status: AC
  Administered 2021-11-26: 150 mg via INTRAVENOUS
  Filled 2021-11-26: qty 150
  Filled 2021-11-26: qty 5

## 2021-11-26 MED ORDER — CETIRIZINE HCL 10 MG PO TABS: 10.0000 mg | ORAL_TABLET | Freq: Every day | ORAL | 0 refills | Status: AC

## 2021-11-26 NOTE — Assessment & Plan Note (Signed)
hemoglobin has decreased as expected.  Monitor counts.  

## 2021-11-26 NOTE — Progress Notes (Signed)
[place] Work  Clinical Social Work was referred by medical provider for assessment of financial needs.  Clinical Social Worker attempted to contact patient by phone  to offer support and assess for financial needs.  CSW called patient and was unable to leave a voicemail.  Patient has received extensive financial support throughout treatment and has exhausted available financial assistance.     Adelene Amas, [place] Worker [facility]

## 2021-11-26 NOTE — Assessment & Plan Note (Signed)
Encourage her check our food bank.  Follow up with Education officer, museum.

## 2021-11-26 NOTE — Assessment & Plan Note (Signed)
Recommend patient to continue utilize Imodium and Lomotil. Continue regular supportive care with hydration session twice a per week.

## 2021-11-26 NOTE — Assessment & Plan Note (Addendum)
Likely due to taxane treatments. Continue to apply moisturizers and topical Neosporin. 

## 2021-11-26 NOTE — Assessment & Plan Note (Addendum)
Left breast cancer, cT4b N3 Mx, ER/PR-, HER2 + Overall she tolerates chemotherapy except diarrhea. Clinically left breast mass has responded to treatment. Shared decision was made to stay on current regimen with [facility] and continue rigorous supportive care.Labs are reviewed and discussed with patient. proceed chemotherapy TCHP, D3 Ellen Henri

## 2021-11-26 NOTE — Patient Instructions (Addendum)
[facility] [place]  Discharge Instructions: Thank you for choosing [facility] to provide your oncology and hematology care.  If you have a lab appointment with the Cancer Center, please go directly to the Cancer Center and check in at the registration area.  Wear comfortable clothing and clothing appropriate for easy access to any Portacath or PICC line.   We strive to give you quality time with your provider. You may need to reschedule your appointment if you arrive late (15 or more minutes).  Arriving late affects you and other patients whose appointments are after yours.  Also, if you miss three or more appointments without notifying the office, you may be dismissed from the clinic at the provider's discretion.      For prescription refill requests, have your pharmacy contact our office and allow 72 hours for refills to be completed.    Today you received the following chemotherapy and/or immunotherapy agents Kanjinti, Perjeta, Taxotere, & Paraplatin      To help prevent nausea and vomiting after your treatment, we encourage you to take your nausea medication as directed.  BELOW ARE SYMPTOMS THAT SHOULD BE REPORTED IMMEDIATELY: *FEVER GREATER THAN 100.4 F (38 C) OR HIGHER *CHILLS OR SWEATING *NAUSEA AND VOMITING THAT IS NOT CONTROLLED WITH YOUR NAUSEA MEDICATION *UNUSUAL SHORTNESS OF BREATH *UNUSUAL BRUISING OR BLEEDING *URINARY PROBLEMS (pain or burning when urinating, or frequent urination) *BOWEL PROBLEMS (unusual diarrhea, constipation, pain near the anus) TENDERNESS IN MOUTH AND THROAT WITH OR WITHOUT PRESENCE OF ULCERS (sore throat, sores in mouth, or a toothache) UNUSUAL RASH, SWELLING OR PAIN  UNUSUAL VAGINAL DISCHARGE OR ITCHING   Items with * indicate a potential emergency and should be followed up as soon as possible or go to the Emergency Department if any problems should occur.  Please show the CHEMOTHERAPY ALERT CARD or  IMMUNOTHERAPY ALERT CARD at check-in to the Emergency Department and triage nurse.  Should you have questions after your visit or need to cancel or reschedule your appointment, please contact [facility] [place]  [phone] and follow the prompts.  Office hours are 8:00 a.m. to 4:30 p.m. Monday - Friday. Please note that voicemails left after 4:00 p.m. may not be returned until the following business day.  We are closed weekends and major holidays. You have access to a nurse at all times for urgent questions. Please call the main number to the clinic [phone] and follow the prompts.  For any non-urgent questions, you may also contact your provider using MyChart. We now offer e-Visits for anyone 18 and older to request care online for non-urgent symptoms. For details visit [email or web address].   Also download the MyChart app! Go to the app store, search "MyChart", open the app, select [facility], and log in with your MyChart username and password.  Masks are optional in the cancer centers. If you would like for your care team to wear a mask while they are taking care of you, please let them know. For doctor visits, patients may have with them one support person who is at least 59 years old. At this time, visitors are not allowed in the infusion area.  

## 2021-11-26 NOTE — Assessment & Plan Note (Signed)
IV magnesium PRN if Mag <1.7 Continue supportive care check magnesium twice per week and IV magnesium as needed.

## 2021-11-26 NOTE — Progress Notes (Signed)
  Pt states  she has been experiecing severe headache more frequently and believes it is due to allergies. States it makes her feel weak and was told not to take Zyrtec and states Clairitin does not seem to be helping.

## 2021-11-26 NOTE — Assessment & Plan Note (Signed)
Treatment plan as listed above. 

## 2021-11-26 NOTE — Assessment & Plan Note (Signed)
Due to allergic sinusitis.  She feels Claritin is not helpful and prefers to take Zyrtec. Rx sent.

## 2021-11-26 NOTE — Assessment & Plan Note (Signed)
Recommend patient to follow up with pain clinic.

## 2021-11-26 NOTE — Assessment & Plan Note (Signed)
continue potassium and chloride 20meq twice daily.  

## 2021-11-26 NOTE — Patient Instructions (Signed)

## 2021-11-26 NOTE — Progress Notes (Signed)
Hematology/Oncology Progress note [phone] [phone]         Patient Care Team: [place] as PCP - General Daiva Huge, RN as Oncology Nurse Navigator  REFERRING PROVIDER: Earlie Server, MD   ASSESSMENT & PLAN:   Cancer Staging  Breast cancer [facility]) Staging form: Breast, AJCC 8th Edition - Clinical stage from 08/21/2021: Stage IIIB (cT4b, cN3c, cM0, G3, ER-, PR-, HER2+) - Signed by Earlie Server, MD on 09/12/2021  Breast cancer ([place]) Left breast cancer, cT4b N3 Mx, ER/PR-, HER2 + Overall she tolerates chemotherapy except diarrhea. Clinically left breast mass has responded to treatment. Shared decision was made to stay on current regimen with [facility] and continue rigorous supportive care.Labs are reviewed and discussed with patient. proceed chemotherapy TCHP, D3 Udenyca   Encounter for antineoplastic chemotherapy Treatment plan as listed above  Anemia due to antineoplastic chemotherapy hemoglobin has decreased as expected.  Monitor counts.   Chemotherapy induced diarrhea Recommend patient to continue utilize Imodium and Lomotil. Continue regular supportive care with hydration session twice a per week.   Chronic pain syndrome Recommend patient to follow up with pain clinic.    Palmar plantar erythrodysesthesia Likely due to taxane treatments. Continue to apply moisturizers and topical Neosporin.  Hypokalemia continue potassium and chloride 53mq twice daily.   Hypomagnesemia IV magnesium PRN if Mag <1.7 Continue supportive care check magnesium twice per week and IV magnesium as needed.  Sinus headache Due to allergic sinusitis.  She feels Claritin is not helpful and prefers to take Zyrtec. Rx sent.    Financial difficulties Encourage her check our food bank.  Follow up with sEducation officer, museum       Orders Placed This Encounter  Procedures   Ambulatory referral to Social Work    Referral Priority:   Routine    Referral Type:    Consultation    Referral Reason:   Specialty Services Required    Number of Visits Requested:   1     All questions were answered. The patient knows to call the clinic with any problems questions or concerns.  Return of visit:  2 weeks. Lab MD TCHP w D3 uSula Rumple MD, PhD [facility] 11/26/2021     CHIEF COMPLAINTS/REASON FOR VISIT:  Follow-up for breast cancer  HISTORY OF PRESENTING ILLNESS:  Vanessa LEVENis a  61y.o.  female with PMH listed below who was referred to me for evaluation of  left breast mass SUMMARY OF ONCOLOGIC HISTORY: Oncology History  [place]  07/[place] Imaging   Bilateral diagnostic mammogram and [place]owed 5 centimeter LEFT breast mass associated with pleomorphic calcifications is suspicious for invasive ductal carcinoma.At least 4 LEFT axillary lymph nodes with abnormal morphology   08/21/2021 Cancer Staging   Staging form: Breast, AJCC 8th Edition - Clinical: Stage IIIB (cT4b, cN3c, cM0, G3, ER-, PR-, HER2+) - Signed by YEarlie Server MD on 08/28/2021 Histologic grading system: 3 grade system  -08/21/21 left breast ultrasound-guided biopsy showed invasive mammary carcinoma, grade 3, ER/PR negative, HER2 positive.  Left axillary lymph node biopsy positive for macro metastatic mammary carcinoma, 8 mm in greatest extent.   08/30/2021 Imaging   MRI brain w wo contrast -No metastatic disease or acute intracranial abnormality. Essentially normal for age MRI appearance of the brain.    09/03/2021 Echocardiogram   1. Left ventricular ejection fraction, by estimation, is 55 to 60%. Left ventricular ejection fraction by 3D volume is 58 %. The  left ventricle has normal function. The left ventricle has no regional wall motion abnormalities. There is mild left  ventricular hypertrophy. Left ventricular diastolic parameters were normal.  2. Right ventricular systolic function is normal. The right ventricular size is normal.  3.  The mitral valve is normal in structure. No evidence of mitral valve regurgitation.  4. The aortic valve was not well visualized. Aortic valve regurgitation is not visualized   09/10/2021 Imaging   PET scan showed Large hypermetabolic left breast mass and diffuse skin thickening,consistent with primary breast carcinoma. Hypermetabolic lymphadenopathy in the left axilla, left subpectoral region, and left supraclavicular region, consistent with metastatic disease. No evidence of metastatic disease within the abdomen or pelvis   09/12/2021 -  Chemotherapy   Patient is on Treatment Plan : BREAST  Docetaxel + Carboplatin + Trastuzumab + Pertuzumab  (TCHP) q21d       Genetic Testing   Negative genetic testing. No pathogenic variants identified on the [facility] Common Hereditary Cancers+RNA panel. The report date is 10/26/2021.  The Common Hereditary Cancers Panel + RNA offered by [facility] includes sequencing and/or deletion duplication testing of the following 47 genes: APC, ATM, AXIN2, BARD1, BMPR1A, BRCA1, BRCA2, BRIP1, CDH1, CDKN2A (p14ARF), CDKN2A (p16INK4a), CKD4, CHEK2, CTNNA1, DICER1, EPCAM (Deletion/duplication testing only), GREM1 (promoter region deletion/duplication testing only), KIT, MEN1, MLH1, MSH2, MSH3, MSH6, MUTYH, NBN, NF1, NHTL1, PALB2, PDGFRA, PMS2, POLD1, POLE, PTEN, RAD50, RAD51C, RAD51D, SDHB, SDHC, SDHD, SMAD4, SMARCA4. STK11, TP53, TSC1, TSC2, and VHL.  The following genes were evaluated for sequence changes only: SDHA and HOXB13 c.251G>A variant only.     INTERVAL HISTORY Vanessa Fisher is a 61 y.o. female who has above history reviewed by me today presents for follow up visit for Triple positive breast cancer treatment.  Currently she is on neoadjuvant TCHP with GCSF support, overall she tolerates moderate difficulties. Due to chemotherapy induced diarrhea, she takes Lomotil and Imodium, with multiple care including IV electrolytes and IV fluid hydration sessions.  No  diarrhea today.  + headache , nasal congestion. No fever chills. She feels this is her allergy symptoms. Claritin not helping.  + financial difficulties. She has discussed with Education officer, museum. She plans to go back to work. Feel stressed.   MEDICAL HISTORY:  Past Medical History:  Diagnosis Date   Anemia    Anxiety    Arthritis    Asthma    WELL CONTROLLED   Bile acid malabsorption syndrome    Bradycardia    HAD AN ISSUE WITH THIS IN 2016-NO PROBLEMS SINCE   Breast cancer, left breast ([place]) 08/2021   Carpal tunnel syndrome on right    Depression    Diabetes mellitus without complication (HCC)    Diverticulitis    Gastric reflux    GERD (gastroesophageal reflux disease)    Hand pain 05/12/2016   Headache    H/O MIGRAINES   History of kidney stones    H/O   Lumbar radiculitis    Neck pain, chronic    Osteoarthritis of both knees    Panic attacks     SURGICAL HISTORY: Past Surgical History:  Procedure Laterality Date   BREAST BIOPSY Left 08/21/2021   Axilla Bx, Hydromarker, path pending   BREAST BIOPSY Left 08/21/2021   [place] Bx, Ribbon Clip, Path Pending   CARPAL TUNNEL RELEASE Right 12/06/2017   Procedure: CARPAL TUNNEL RELEASE;  Surgeon: Earnestine Leys, MD;  Location: [facility] ORS;  Service: Orthopedics;  Laterality: Right;   COLONOSCOPY WITH PROPOFOL N/A 06/11/2021  Procedure: COLONOSCOPY WITH PROPOFOL;  Surgeon: Lin Landsman, MD;  Location: [facility] ENDOSCOPY;  Service: Gastroenterology;  Laterality: N/A;   DORSAL COMPARTMENT RELEASE Right 12/06/2017   Procedure: RELEASE DORSAL COMPARTMENT (DEQUERVAIN);  Surgeon: Earnestine Leys, MD;  Location: [facility] ORS;  Service: Orthopedics;  Laterality: Right;   ESOPHAGOGASTRODUODENOSCOPY (EGD) WITH PROPOFOL N/A 06/11/2021   Procedure: ESOPHAGOGASTRODUODENOSCOPY (EGD) WITH PROPOFOL;  Surgeon: Lin Landsman, MD;  Location: [facility] ENDOSCOPY;  Service: Gastroenterology;  Laterality: N/A;   PARTIAL HYSTERECTOMY     PORTACATH PLACEMENT  N/A 09/24/2021   Procedure: INSERTION PORT-A-CATH;  Surgeon: Herbert Pun, MD;  Location: [facility] ORS;  Service: General;  Laterality: N/A;   TUBAL LIGATION      SOCIAL HISTORY: Social History   Socioeconomic History   Marital status: Single    Spouse name: Not on file   Number of children: Not on file   Years of education: Not on file   Highest education level: Not on file  Occupational History   Not on file  Tobacco Use   Smoking status: Former    Types: Cigarettes   Smokeless tobacco: Never  Vaping Use   Vaping Use: Never used  Substance and Sexual Activity   Alcohol use: No   Drug use: No   Sexual activity: Not on file  Other Topics Concern   Not on file  Social History Narrative   Lives alone   Social Determinants of Health   Financial Resource Strain: High Risk (08/15/2021)   Overall Financial Resource Strain (CARDIA)    Difficulty of Paying Living Expenses: Very hard  Food Insecurity: Food Insecurity Present (09/09/2021)   Hunger Vital Sign    Worried About Running Out of Food in the Last Year: Often true    Ran Out of Food in the Last Year: Often true  Transportation Needs: No Transportation Needs (08/15/2021)   PRAPARE - Hydrologist (Medical): No    Lack of Transportation (Non-Medical): No  Physical Activity: Inactive (08/15/2021)   Exercise Vital Sign    Days of Exercise per Week: 0 days    Minutes of Exercise per Session: 0 min  Stress: Stress Concern Present (08/15/2021)   [place]    Feeling of Stress : Rather much  Social Connections: Socially Isolated (08/15/2021)   Social Connection and Isolation Panel [NHANES]    Frequency of Communication with Friends and Family: Once a week    Frequency of Social Gatherings with Friends and Family: Once a week    Attends Religious Services: Never    Marine scientist or Organizations: No    Attends Programme researcher, broadcasting/film/video: Not on file    Marital Status: Never married  Intimate Partner Violence: Not At Risk (08/15/2021)   Humiliation, Afraid, Rape, and Kick questionnaire    Fear of Current or Ex-Partner: No    Emotionally Abused: No    Physically Abused: No    Sexually Abused: No    FAMILY HISTORY: Family History  Problem Relation Age of Onset   Diabetes Mother    Hypertension Mother    Heart failure Mother    Pancreatic cancer Mother 28   Diabetes Father    Hypertension Father    Breast cancer Maternal Aunt    Cervical cancer Maternal Aunt    Lung cancer Son 24    ALLERGIES:  is allergic to bc powder [aspirin-salicylamide-caffeine], gabapentin, hydrocodone-acetaminophen, latex, penicillin g, penicillins, and shellfish  allergy.  MEDICATIONS:  Current Outpatient Medications  Medication Sig Dispense Refill   albuterol (PROVENTIL) (2.5 MG/3ML) 0.083% nebulizer solution Take 3 mLs (2.5 mg total) by nebulization every 4 (four) hours as needed for wheezing or shortness of breath. 75 mL 2   albuterol (VENTOLIN HFA) 108 (90 Base) MCG/ACT inhaler Inhale 2 puffs into the lungs every 6 (six) hours as needed for wheezing or shortness of breath. 18 g 0   cetirizine (ZYRTEC ALLERGY) 10 MG tablet Take 1 tablet (10 mg total) by mouth daily. 90 tablet 0   dexamethasone (DECADRON) 4 MG tablet Take 2 tablets (8 mg total) by mouth daily. Start the day before Taxotere. Then take daily x 2 days after chemotherapy. 30 tablet 1   diphenoxylate-atropine (LOMOTIL) 2.5-0.025 MG tablet Take 1 tablet by mouth 4 (four) times daily. 60 tablet 1   FLOVENT DISKUS 250 MCG/ACT AEPB Inhale 1 puff into the lungs daily.     fluticasone (FLONASE) 50 MCG/ACT nasal spray Place 2 sprays into both nostrils daily. 15.8 mL 0   lidocaine-prilocaine (EMLA) cream Apply 1 Application topically as needed. 30 g 12   lisinopril (ZESTRIL) 10 MG tablet Take 10 mg by mouth daily.     lisinopril-hydrochlorothiazide (ZESTORETIC)  10-12.5 MG tablet Take 1 tablet by mouth daily.     loperamide (IMODIUM) 2 MG capsule Take 1 capsule (2 mg total) by mouth See admin instructions. Initial: 4 mg,the 2 mg every 2 hours (4 mg every 4 hours at night)  maximum: 16 mg/day 90 capsule 1   magic mouthwash (multi-ingredient) oral suspension Swish and spit 5-10 ml by  mouth 4 times a day as needed 400 mL 1   magic mouthwash w/lidocaine SOLN Take 5 mLs by mouth 4 (four) times daily as needed for mouth pain. Sig: Swish/Swallow 5-10 ml four times a day as needed. Dispense 480 ml. 1RF 480 mL 1   montelukast (SINGULAIR) 10 MG tablet Take by mouth.     ondansetron (ZOFRAN) 8 MG tablet Take 1 tablet (8 mg total) by mouth 2 (two) times daily as needed (Nausea or vomiting). Start on the third day after chemotherapy. 30 tablet 1   oxyCODONE-acetaminophen (PERCOCET/ROXICET) 5-325 MG tablet Take 1 tablet by mouth every 6 (six) hours as needed for pain.     pantoprazole (PROTONIX) 40 MG tablet Take 40 mg by mouth daily.     potassium chloride SA (KLOR-CON M) 20 MEQ tablet Take 1 tablet (20 mEq total) by mouth 2 (two) times daily. 60 tablet 1   prochlorperazine (COMPAZINE) 10 MG tablet Take 1 tablet (10 mg total) by mouth every 6 (six) hours as needed (Nausea or vomiting). 30 tablet 1   TRULICITY 3 AX/6.5VV SOPN Inject 0.75 mg into the skin once a week. Monday     naloxone (NARCAN) nasal spray 4 mg/0.1 mL  (Patient not taking: Reported on 11/26/2021)     No current facility-administered medications for this visit.   Facility-Administered Medications Ordered in Other Visits  Medication Dose Route Frequency Provider Last Rate Last Admin   CARBOplatin (PARAPLATIN) 770 mg in sodium chloride 0.9 % 250 mL chemo infusion  770 mg Intravenous Once Earlie Server, MD       heparin lock flush 100 unit/mL  500 Units Intracatheter Once PRN Earlie Server, MD        Review of Systems  Constitutional:  Negative for appetite change, chills, fatigue and fever.  HENT:   Negative  for hearing loss and voice change.  Eyes:  Negative for eye problems.  Respiratory:  Negative for chest tightness and cough.   Cardiovascular:  Negative for chest pain.  Gastrointestinal:  Negative for abdominal distention, abdominal pain and blood in stool.  Endocrine: Negative for hot flashes.  Genitourinary:  Negative for difficulty urinating and frequency.   Musculoskeletal:  Positive for arthralgias and back pain.  Skin:  Negative for itching and rash.  Neurological:  Negative for extremity weakness.  Hematological:  Negative for adenopathy.  Psychiatric/Behavioral:  Negative for confusion.        Feel stressed.     PHYSICAL EXAMINATION: ECOG PERFORMANCE STATUS: 1 - Symptomatic but completely ambulatory Vitals:   11/26/21 0844  BP: (!) 104/51  Pulse: 81  Resp: 18  Temp: (!) 97.4 F (36.3 C)  SpO2: 100%   Filed Weights   11/26/21 0844  Weight: 207 lb 11.2 oz (94.2 kg)   Physical Exam Constitutional:      General: She is not in acute distress.    Appearance: She is not diaphoretic.  HENT:     Head: Normocephalic and atraumatic.     Nose: Nose normal.     Mouth/Throat:     Pharynx: No oropharyngeal exudate.  Eyes:     General: No scleral icterus.    Pupils: Pupils are equal, round, and reactive to light.  Cardiovascular:     Rate and Rhythm: Normal rate and regular rhythm.     Heart sounds: No murmur heard. Pulmonary:     Effort: Pulmonary effort is normal. No respiratory distress.     Breath sounds: No rales.  Chest:     Chest wall: No tenderness.  Abdominal:     General: There is no distension.     Palpations: Abdomen is soft.     Tenderness: There is no abdominal tenderness.  Musculoskeletal:        General: Normal range of motion.     Cervical back: Normal range of motion and neck supple.  Skin:    General: Skin is warm and dry.     Findings: No erythema.     Comments: Bilateral finger tip erythematous with small red papules and some desquamation   Neurological:     Mental Status: She is alert and oriented to person, place, and time.     Cranial Nerves: No cranial nerve deficit.     Motor: No abnormal muscle tone.     Coordination: Coordination normal.  Psychiatric:        Mood and Affect: Affect normal.   Left breast mass breast creased in size.  LABORATORY DATA:  I have reviewed the data as listed    Latest Ref Rng & Units 11/26/2021    8:13 AM 11/19/2021    8:42 AM 11/10/2021    2:34 PM  CBC  WBC 4.0 - 10.5 K/uL 3.3  2.5  7.2   Hemoglobin 12.0 - 15.0 g/dL 9.1  8.5  9.1   Hematocrit 36.0 - 46.0 % 28.5  26.2  28.4   Platelets 150 - 400 K/uL 461  74  188       Latest Ref Rng & Units 11/26/2021    8:13 AM 11/19/2021    8:42 AM 11/14/2021    9:57 AM  CMP  Glucose 70 - 99 mg/dL 142  102  115   BUN 6 - 20 mg/dL _0 Creatinine 0.44 - 1.00 mg/dL 0.90  0.72  0.73   Sodium 135 - 145  mmol/L 138  140  140   Potassium 3.5 - 5.1 mmol/L 4.3  3.3  3.1   Chloride 98 - 111 mmol/L 107  108  106   CO2 22 - 32 mmol/L _0 Calcium 8.9 - 10.3 mg/dL 8.9  8.7  8.3   Total Protein 6.5 - 8.1 g/dL 7.2  6.6    Total Bilirubin 0.3 - 1.2 mg/dL 0.3  0.4    Alkaline Phos 38 - 126 U/L 97  80    AST 15 - 41 U/L 19  18    ALT 0 - 44 U/L 16  13        RADIOGRAPHIC STUDIES: I have personally reviewed the radiological images as listed and agreed with the findings in the report. No results found.

## 2021-11-28 ENCOUNTER — Inpatient Hospital Stay: Payer: Medicare HMO

## 2021-11-28 VITALS — BP 105/63 | HR 75 | Temp 97.0°F

## 2021-11-28 DIAGNOSIS — Z171 Estrogen receptor negative status [ER-]: Secondary | ICD-10-CM

## 2021-11-28 DIAGNOSIS — T451X5A Adverse effect of antineoplastic and immunosuppressive drugs, initial encounter: Secondary | ICD-10-CM | POA: Diagnosis not present

## 2021-11-28 DIAGNOSIS — E876 Hypokalemia: Secondary | ICD-10-CM

## 2021-11-28 DIAGNOSIS — Z5112 Encounter for antineoplastic immunotherapy: Secondary | ICD-10-CM | POA: Diagnosis not present

## 2021-11-28 DIAGNOSIS — Z5189 Encounter for other specified aftercare: Secondary | ICD-10-CM | POA: Diagnosis not present

## 2021-11-28 DIAGNOSIS — K521 Toxic gastroenteritis and colitis: Secondary | ICD-10-CM

## 2021-11-28 DIAGNOSIS — C50812 Malignant neoplasm of overlapping sites of left female breast: Secondary | ICD-10-CM | POA: Diagnosis not present

## 2021-11-28 DIAGNOSIS — D6481 Anemia due to antineoplastic chemotherapy: Secondary | ICD-10-CM | POA: Diagnosis not present

## 2021-11-28 DIAGNOSIS — C50011 Malignant neoplasm of nipple and areola, right female breast: Secondary | ICD-10-CM

## 2021-11-28 DIAGNOSIS — D701 Agranulocytosis secondary to cancer chemotherapy: Secondary | ICD-10-CM | POA: Diagnosis not present

## 2021-11-28 DIAGNOSIS — Z5111 Encounter for antineoplastic chemotherapy: Secondary | ICD-10-CM | POA: Diagnosis not present

## 2021-11-28 LAB — BASIC METABOLIC PANEL
Anion gap: 7 (ref 5–15)
BUN: 20 mg/dL (ref 6–20)
CO2: 26 mmol/L (ref 22–32)
Calcium: 8.4 mg/dL — ABNORMAL LOW (ref 8.9–10.3)
Chloride: 106 mmol/L (ref 98–111)
Creatinine, Ser: 0.87 mg/dL (ref 0.44–1.00)
GFR, Estimated: >60 mL/min (ref >60)
Glucose, Bld: 112 mg/dL — ABNORMAL HIGH (ref 70–99)
Potassium: 3.3 mmol/L — ABNORMAL LOW (ref 3.5–5.1)
Sodium: 139 mmol/L (ref 135–145)

## 2021-11-28 LAB — MAGNESIUM: Magnesium: 2.1 mg/dL (ref 1.7–2.4)

## 2021-11-28 MED ORDER — SODIUM CHLORIDE 0.9% FLUSH
10.0000 mL | Freq: Once | INTRAVENOUS | Status: AC | PRN
  Administered 2021-11-28: 10 mL
  Filled 2021-11-28: qty 10

## 2021-11-28 MED ORDER — PEGFILGRASTIM-CBQV 6 MG/0.6ML ~~LOC~~ SOSY
6.0000 mg | PREFILLED_SYRINGE | Freq: Once | SUBCUTANEOUS | Status: AC
  Administered 2021-11-28: 6 mg via SUBCUTANEOUS
  Filled 2021-11-28: qty 0.6

## 2021-11-28 MED ORDER — POTASSIUM CHLORIDE 20 MEQ/100ML IV SOLN
20.0000 meq | Freq: Once | INTRAVENOUS | Status: AC
  Administered 2021-11-28: 20 meq via INTRAVENOUS

## 2021-11-28 MED ORDER — SODIUM CHLORIDE 0.9 % IV SOLN
Freq: Once | INTRAVENOUS | Status: AC
  Filled 2021-11-28: qty 250

## 2021-11-28 MED ORDER — HEPARIN SOD (PORK) LOCK FLUSH 100 UNIT/ML IV SOLN
500.0000 [IU] | Freq: Once | INTRAVENOUS | Status: AC | PRN
  Administered 2021-11-28: 500 [IU]
  Filled 2021-11-28: qty 5

## 2021-12-01 ENCOUNTER — Inpatient Hospital Stay: Payer: Medicare HMO

## 2021-12-01 VITALS — BP 104/68 | HR 84 | Temp 98.1°F | Resp 17

## 2021-12-01 DIAGNOSIS — D6481 Anemia due to antineoplastic chemotherapy: Secondary | ICD-10-CM | POA: Diagnosis not present

## 2021-12-01 DIAGNOSIS — C50812 Malignant neoplasm of overlapping sites of left female breast: Secondary | ICD-10-CM | POA: Diagnosis not present

## 2021-12-01 DIAGNOSIS — K521 Toxic gastroenteritis and colitis: Secondary | ICD-10-CM

## 2021-12-01 DIAGNOSIS — R12 Heartburn: Secondary | ICD-10-CM

## 2021-12-01 DIAGNOSIS — Z5112 Encounter for antineoplastic immunotherapy: Secondary | ICD-10-CM | POA: Diagnosis not present

## 2021-12-01 DIAGNOSIS — T451X5A Adverse effect of antineoplastic and immunosuppressive drugs, initial encounter: Secondary | ICD-10-CM | POA: Diagnosis not present

## 2021-12-01 DIAGNOSIS — Z5111 Encounter for antineoplastic chemotherapy: Secondary | ICD-10-CM | POA: Diagnosis not present

## 2021-12-01 DIAGNOSIS — C50011 Malignant neoplasm of nipple and areola, right female breast: Secondary | ICD-10-CM

## 2021-12-01 DIAGNOSIS — D701 Agranulocytosis secondary to cancer chemotherapy: Secondary | ICD-10-CM | POA: Diagnosis not present

## 2021-12-01 DIAGNOSIS — E876 Hypokalemia: Secondary | ICD-10-CM | POA: Diagnosis not present

## 2021-12-01 DIAGNOSIS — Z5189 Encounter for other specified aftercare: Secondary | ICD-10-CM | POA: Diagnosis not present

## 2021-12-01 LAB — MAGNESIUM: Magnesium: 1.9 mg/dL (ref 1.7–2.4)

## 2021-12-01 LAB — BASIC METABOLIC PANEL
Anion gap: 7 (ref 5–15)
BUN: 16 mg/dL (ref 6–20)
CO2: 26 mmol/L (ref 22–32)
Calcium: 9.1 mg/dL (ref 8.9–10.3)
Chloride: 100 mmol/L (ref 98–111)
Creatinine, Ser: 0.79 mg/dL (ref 0.44–1.00)
GFR, Estimated: >60 mL/min (ref >60)
Glucose, Bld: 107 mg/dL — ABNORMAL HIGH (ref 70–99)
Potassium: 3.6 mmol/L (ref 3.5–5.1)
Sodium: 133 mmol/L — ABNORMAL LOW (ref 135–145)

## 2021-12-01 MED ORDER — HEPARIN SOD (PORK) LOCK FLUSH 100 UNIT/ML IV SOLN
500.0000 [IU] | Freq: Once | INTRAVENOUS | Status: AC | PRN
  Administered 2021-12-01: 500 [IU]
  Filled 2021-12-01: qty 5

## 2021-12-01 MED ORDER — FAMOTIDINE IN NACL 20-0.9 MG/50ML-% IV SOLN
20.0000 mg | Freq: Once | INTRAVENOUS | Status: AC
  Administered 2021-12-01: 20 mg via INTRAVENOUS

## 2021-12-01 MED ORDER — SODIUM CHLORIDE 0.9 % IV SOLN
Freq: Once | INTRAVENOUS | Status: AC
  Filled 2021-12-01: qty 250

## 2021-12-01 MED ORDER — SODIUM CHLORIDE 0.9% FLUSH
10.0000 mL | Freq: Once | INTRAVENOUS | Status: AC | PRN
  Administered 2021-12-01: 10 mL
  Filled 2021-12-01: qty 10

## 2021-12-01 NOTE — Progress Notes (Signed)
Pt still having poor appetite and weakness. Receiving 1 L NS. Pt has ongoing complaints of indigestion/ heartburn with some reflux. Given IV pepcid today as well.

## 2021-12-04 ENCOUNTER — Inpatient Hospital Stay: Payer: Medicare HMO | Attending: Oncology

## 2021-12-04 ENCOUNTER — Inpatient Hospital Stay: Payer: Medicare HMO

## 2021-12-04 VITALS — BP 101/70 | HR 91 | Temp 98.4°F | Resp 20

## 2021-12-04 DIAGNOSIS — Z452 Encounter for adjustment and management of vascular access device: Secondary | ICD-10-CM | POA: Insufficient documentation

## 2021-12-04 DIAGNOSIS — R197 Diarrhea, unspecified: Secondary | ICD-10-CM | POA: Insufficient documentation

## 2021-12-04 DIAGNOSIS — T451X5A Adverse effect of antineoplastic and immunosuppressive drugs, initial encounter: Secondary | ICD-10-CM

## 2021-12-04 DIAGNOSIS — D6481 Anemia due to antineoplastic chemotherapy: Secondary | ICD-10-CM | POA: Diagnosis not present

## 2021-12-04 DIAGNOSIS — C50011 Malignant neoplasm of nipple and areola, right female breast: Secondary | ICD-10-CM

## 2021-12-04 DIAGNOSIS — Z171 Estrogen receptor negative status [ER-]: Secondary | ICD-10-CM | POA: Insufficient documentation

## 2021-12-04 DIAGNOSIS — J019 Acute sinusitis, unspecified: Secondary | ICD-10-CM | POA: Insufficient documentation

## 2021-12-04 DIAGNOSIS — E876 Hypokalemia: Secondary | ICD-10-CM | POA: Diagnosis not present

## 2021-12-04 DIAGNOSIS — C50812 Malignant neoplasm of overlapping sites of left female breast: Secondary | ICD-10-CM | POA: Diagnosis not present

## 2021-12-04 LAB — BASIC METABOLIC PANEL
Anion gap: 8 (ref 5–15)
BUN: 19 mg/dL (ref 6–20)
CO2: 28 mmol/L (ref 22–32)
Calcium: 9.2 mg/dL (ref 8.9–10.3)
Chloride: 102 mmol/L (ref 98–111)
Creatinine, Ser: 1.38 mg/dL — ABNORMAL HIGH (ref 0.44–1.00)
GFR, Estimated: 44 mL/min — ABNORMAL LOW (ref >60)
Glucose, Bld: 117 mg/dL — ABNORMAL HIGH (ref 70–99)
Potassium: 3.3 mmol/L — ABNORMAL LOW (ref 3.5–5.1)
Sodium: 138 mmol/L (ref 135–145)

## 2021-12-04 LAB — MAGNESIUM: Magnesium: 1.6 mg/dL — ABNORMAL LOW (ref 1.7–2.4)

## 2021-12-04 MED ORDER — SODIUM CHLORIDE 0.9 % IV SOLN
Freq: Once | INTRAVENOUS | Status: AC
  Filled 2021-12-04: qty 250

## 2021-12-04 MED ORDER — SODIUM CHLORIDE 0.9% FLUSH
10.0000 mL | Freq: Once | INTRAVENOUS | Status: AC | PRN
  Administered 2021-12-04: 10 mL
  Filled 2021-12-04: qty 10

## 2021-12-04 MED ORDER — HEPARIN SOD (PORK) LOCK FLUSH 100 UNIT/ML IV SOLN
500.0000 [IU] | Freq: Once | INTRAVENOUS | Status: AC | PRN
  Administered 2021-12-04: 500 [IU]
  Filled 2021-12-04: qty 5

## 2021-12-04 MED ORDER — MAGNESIUM SULFATE 2 GM/50ML IV SOLN
2.0000 g | Freq: Once | INTRAVENOUS | Status: AC
  Administered 2021-12-04: 2 g via INTRAVENOUS
  Filled 2021-12-04: qty 50

## 2021-12-08 ENCOUNTER — Inpatient Hospital Stay: Payer: Medicare HMO

## 2021-12-08 ENCOUNTER — Telehealth: Payer: Self-pay | Admitting: *Deleted

## 2021-12-08 NOTE — Telephone Encounter (Signed)
Patient no showed by her apt with port lab and fluid clinic.I reach out to the patient to check on her. She stated that she was "told that the apt were at 9 pm tonight." I told her that the apt was this morning and we are not open this late. Pt declines to r/s her apts for today and stated that she would keep the apts for possible iv fluids for Thursday as planned. She stated that "it's too much for me to do IV fluids at least 2 times a week anyways. I just am not able come twice a week."  Pt thanked me for calling her and checking on her.

## 2021-12-09 ENCOUNTER — Other Ambulatory Visit: Payer: Medicare HMO

## 2021-12-09 ENCOUNTER — Ambulatory Visit: Payer: Medicare HMO | Admitting: Oncology

## 2021-12-10 ENCOUNTER — Other Ambulatory Visit: Payer: Medicare HMO

## 2021-12-10 ENCOUNTER — Ambulatory Visit: Payer: Medicare HMO

## 2021-12-10 ENCOUNTER — Ambulatory Visit: Payer: Medicare HMO | Admitting: Oncology

## 2021-12-11 ENCOUNTER — Inpatient Hospital Stay: Payer: Medicare HMO

## 2021-12-11 ENCOUNTER — Other Ambulatory Visit: Payer: Self-pay | Admitting: *Deleted

## 2021-12-11 DIAGNOSIS — K521 Toxic gastroenteritis and colitis: Secondary | ICD-10-CM

## 2021-12-11 DIAGNOSIS — C50812 Malignant neoplasm of overlapping sites of left female breast: Secondary | ICD-10-CM | POA: Diagnosis not present

## 2021-12-11 DIAGNOSIS — E876 Hypokalemia: Secondary | ICD-10-CM

## 2021-12-11 DIAGNOSIS — Z95828 Presence of other vascular implants and grafts: Secondary | ICD-10-CM

## 2021-12-11 DIAGNOSIS — C50011 Malignant neoplasm of nipple and areola, right female breast: Secondary | ICD-10-CM

## 2021-12-11 LAB — BASIC METABOLIC PANEL
Anion gap: 9 (ref 5–15)
BUN: 16 mg/dL (ref 6–20)
CO2: 26 mmol/L (ref 22–32)
Calcium: 7.9 mg/dL — ABNORMAL LOW (ref 8.9–10.3)
Chloride: 104 mmol/L (ref 98–111)
Creatinine, Ser: 1.01 mg/dL — ABNORMAL HIGH (ref 0.44–1.00)
GFR, Estimated: >60 mL/min (ref >60)
Glucose, Bld: 113 mg/dL — ABNORMAL HIGH (ref 70–99)
Potassium: 2.8 mmol/L — ABNORMAL LOW (ref 3.5–5.1)
Sodium: 139 mmol/L (ref 135–145)

## 2021-12-11 LAB — MAGNESIUM: Magnesium: 1.4 mg/dL — ABNORMAL LOW (ref 1.7–2.4)

## 2021-12-11 MED ORDER — SODIUM CHLORIDE 0.9% FLUSH
10.0000 mL | INTRAVENOUS | Status: DC | PRN
  Administered 2021-12-11: 10 mL via INTRAVENOUS
  Filled 2021-12-11: qty 10

## 2021-12-11 MED ORDER — SODIUM CHLORIDE 0.9 % IV SOLN
Freq: Once | INTRAVENOUS | Status: AC
  Filled 2021-12-11: qty 250

## 2021-12-11 MED ORDER — SLOW-MAG 71.5-119 MG PO TBEC: 1.0000 | DELAYED_RELEASE_TABLET | Freq: Every day | ORAL | 3 refills | Status: DC

## 2021-12-11 MED ORDER — POTASSIUM CHLORIDE 20 MEQ/100ML IV SOLN
20.0000 meq | Freq: Once | INTRAVENOUS | Status: AC
  Administered 2021-12-11: 20 meq via INTRAVENOUS

## 2021-12-11 MED ORDER — MAGNESIUM SULFATE 2 GM/50ML IV SOLN
2.0000 g | Freq: Once | INTRAVENOUS | Status: AC
  Administered 2021-12-11: 2 g via INTRAVENOUS
  Filled 2021-12-11: qty 50

## 2021-12-11 MED ORDER — HEPARIN SOD (PORK) LOCK FLUSH 100 UNIT/ML IV SOLN
500.0000 [IU] | Freq: Once | INTRAVENOUS | Status: AC
  Administered 2021-12-11: 500 [IU] via INTRAVENOUS
  Filled 2021-12-11: qty 5

## 2021-12-11 NOTE — Progress Notes (Signed)
Pt in for labs and possible fluids and magnesium.  Pt reports she is feeling well, best she has in several weeks.  Pt wanted to wait on labs before starting fluids today.

## 2021-12-11 NOTE — Patient Instructions (Addendum)
We sent a script for oral magnesium. Please pick up your prescription at [facility] in [place].  Return to clinic tomorrow for labs and possible IV fluids/IV mag/IV potassium

## 2021-12-12 ENCOUNTER — Ambulatory Visit: Payer: Medicare HMO

## 2021-12-12 ENCOUNTER — Inpatient Hospital Stay: Payer: Medicare HMO

## 2021-12-12 ENCOUNTER — Other Ambulatory Visit: Payer: Medicare HMO

## 2021-12-12 VITALS — BP 113/72 | HR 60 | Temp 97.3°F | Resp 20

## 2021-12-12 DIAGNOSIS — T451X5A Adverse effect of antineoplastic and immunosuppressive drugs, initial encounter: Secondary | ICD-10-CM

## 2021-12-12 DIAGNOSIS — C50011 Malignant neoplasm of nipple and areola, right female breast: Secondary | ICD-10-CM

## 2021-12-12 DIAGNOSIS — C50812 Malignant neoplasm of overlapping sites of left female breast: Secondary | ICD-10-CM | POA: Diagnosis not present

## 2021-12-12 DIAGNOSIS — E876 Hypokalemia: Secondary | ICD-10-CM

## 2021-12-12 LAB — MAGNESIUM: Magnesium: 1.6 mg/dL — ABNORMAL LOW (ref 1.7–2.4)

## 2021-12-12 LAB — BASIC METABOLIC PANEL
Anion gap: 8 (ref 5–15)
BUN: 13 mg/dL (ref 6–20)
CO2: 25 mmol/L (ref 22–32)
Calcium: 8.1 mg/dL — ABNORMAL LOW (ref 8.9–10.3)
Chloride: 104 mmol/L (ref 98–111)
Creatinine, Ser: 0.91 mg/dL (ref 0.44–1.00)
GFR, Estimated: >60 mL/min (ref >60)
Glucose, Bld: 103 mg/dL — ABNORMAL HIGH (ref 70–99)
Potassium: 3 mmol/L — ABNORMAL LOW (ref 3.5–5.1)
Sodium: 137 mmol/L (ref 135–145)

## 2021-12-12 MED ORDER — SODIUM CHLORIDE 0.9 % IV SOLN
Freq: Once | INTRAVENOUS | Status: AC
  Filled 2021-12-12: qty 250

## 2021-12-12 MED ORDER — HEPARIN SOD (PORK) LOCK FLUSH 100 UNIT/ML IV SOLN
500.0000 [IU] | Freq: Once | INTRAVENOUS | Status: AC
  Administered 2021-12-12: 500 [IU] via INTRAVENOUS
  Filled 2021-12-12: qty 5

## 2021-12-12 MED ORDER — POTASSIUM CHLORIDE 20 MEQ/100ML IV SOLN
20.0000 meq | Freq: Once | INTRAVENOUS | Status: AC
  Administered 2021-12-12: 20 meq via INTRAVENOUS

## 2021-12-12 MED ORDER — SODIUM CHLORIDE 0.9% FLUSH
10.0000 mL | Freq: Once | INTRAVENOUS | Status: AC
  Administered 2021-12-12: 10 mL via INTRAVENOUS
  Filled 2021-12-12: qty 10

## 2021-12-12 MED ORDER — MAGNESIUM SULFATE 2 GM/50ML IV SOLN
2.0000 g | Freq: Once | INTRAVENOUS | Status: AC
  Administered 2021-12-12: 2 g via INTRAVENOUS
  Filled 2021-12-12: qty 50

## 2021-12-15 ENCOUNTER — Other Ambulatory Visit: Payer: Self-pay

## 2021-12-15 ENCOUNTER — Inpatient Hospital Stay (HOSPITAL_BASED_OUTPATIENT_CLINIC_OR_DEPARTMENT_OTHER): Payer: Medicare HMO | Admitting: Hospice and Palliative Medicine

## 2021-12-15 ENCOUNTER — Inpatient Hospital Stay: Payer: Medicare HMO

## 2021-12-15 ENCOUNTER — Encounter: Payer: Self-pay | Admitting: Hospice and Palliative Medicine

## 2021-12-15 DIAGNOSIS — E876 Hypokalemia: Secondary | ICD-10-CM | POA: Diagnosis not present

## 2021-12-15 DIAGNOSIS — T451X5A Adverse effect of antineoplastic and immunosuppressive drugs, initial encounter: Secondary | ICD-10-CM

## 2021-12-15 DIAGNOSIS — C50812 Malignant neoplasm of overlapping sites of left female breast: Secondary | ICD-10-CM | POA: Diagnosis not present

## 2021-12-15 LAB — BASIC METABOLIC PANEL
Anion gap: 8 (ref 5–15)
BUN: 13 mg/dL (ref 6–20)
CO2: 28 mmol/L (ref 22–32)
Calcium: 9 mg/dL (ref 8.9–10.3)
Chloride: 103 mmol/L (ref 98–111)
Creatinine, Ser: 0.87 mg/dL (ref 0.44–1.00)
GFR, Estimated: >60 mL/min (ref >60)
Glucose, Bld: 119 mg/dL — ABNORMAL HIGH (ref 70–99)
Potassium: 3.6 mmol/L (ref 3.5–5.1)
Sodium: 139 mmol/L (ref 135–145)

## 2021-12-15 LAB — MAGNESIUM: Magnesium: 1.4 mg/dL — ABNORMAL LOW (ref 1.7–2.4)

## 2021-12-15 MED ORDER — HEPARIN SOD (PORK) LOCK FLUSH 100 UNIT/ML IV SOLN
500.0000 [IU] | Freq: Once | INTRAVENOUS | Status: AC
  Administered 2021-12-15: 500 [IU] via INTRAVENOUS
  Filled 2021-12-15: qty 5

## 2021-12-15 MED ORDER — SODIUM CHLORIDE 0.9 % IV SOLN
INTRAVENOUS | Status: DC
  Filled 2021-12-15 (×2): qty 250

## 2021-12-15 MED ORDER — SODIUM CHLORIDE 0.9% FLUSH
10.0000 mL | Freq: Once | INTRAVENOUS | Status: DC
  Filled 2021-12-15: qty 10

## 2021-12-15 MED ORDER — MAGNESIUM SULFATE 2 GM/50ML IV SOLN
2.0000 g | Freq: Once | INTRAVENOUS | Status: AC
  Administered 2021-12-15: 2 g via INTRAVENOUS

## 2021-12-15 NOTE — Progress Notes (Signed)
Symptom Management [place] at [facility] [phone] [phone]  Patient Care Team: Randall as PCP - General Daiva Huge, RN as Oncology Nurse Navigator Earlie Server, MD as Consulting Physician (Oncology)   NAME OF PATIENT: Vanessa Fisher  [id number]  1960/12/25   DATE OF VISIT: 12/15/21  REASON FOR CONSULT: CORTLYN CANNELL is a 61 y.o. 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   Patient developed superficial thrombophlebitis with cycle 1 TCHP chemotherapy.  She started on short course of Eliquis and is now status post port placement.  INTERVAL HISTORY: Patient last saw Dr. Tasia Catchings on 11/26/2021.  Patient is on treatment regimen with TCHP and has had ongoing diarrhea causing hypokalemia and hypomagnesemia.  She is required frequent electrolyte repletion in addition to IV fluids.  Patient presents [facility] today for labs/follow-up.  She reports that she feels well and denies any active symptoms.  No active diarrhea, nausea, or vomiting.  No fever or chills.  Appetite is reportedly good.  No urinary complaints.  Patient denies any further complaints today.  PAST MEDICAL HISTORY: Past Medical History:  Diagnosis Date   Anemia    Anxiety    Arthritis    Asthma    WELL CONTROLLED   Bile acid malabsorption syndrome    Bradycardia    HAD AN ISSUE WITH THIS IN 2016-NO PROBLEMS SINCE   Breast cancer, left breast ([place]) 08/2021   Carpal tunnel syndrome on right    Depression    Diabetes mellitus without complication (HCC)    Diverticulitis    Gastric reflux    GERD (gastroesophageal reflux disease)    Hand pain 05/12/2016   Headache    H/O MIGRAINES   History of kidney stones    H/O   Lumbar radiculitis    Neck pain, chronic    Osteoarthritis of both knees    Panic attacks     PAST SURGICAL HISTORY:  Past Surgical History:  Procedure Laterality  Date   BREAST BIOPSY Left 08/21/2021   Axilla Bx, Hydromarker, path pending   BREAST BIOPSY Left 08/21/2021   [place] Bx, Ribbon Clip, Path Pending   CARPAL TUNNEL RELEASE Right 12/06/2017   Procedure: CARPAL TUNNEL RELEASE;  Surgeon: Earnestine Leys, MD;  Location: [facility] ORS;  Service: Orthopedics;  Laterality: Right;   COLONOSCOPY WITH PROPOFOL N/A 06/11/2021   Procedure: COLONOSCOPY WITH PROPOFOL;  Surgeon: Lin Landsman, MD;  Location: [facility] ENDOSCOPY;  Service: Gastroenterology;  Laterality: N/A;   DORSAL COMPARTMENT RELEASE Right 12/06/2017   Procedure: RELEASE DORSAL COMPARTMENT (DEQUERVAIN);  Surgeon: Earnestine Leys, MD;  Location: [facility] ORS;  Service: Orthopedics;  Laterality: Right;   ESOPHAGOGASTRODUODENOSCOPY (EGD) WITH PROPOFOL N/A 06/11/2021   Procedure: ESOPHAGOGASTRODUODENOSCOPY (EGD) WITH PROPOFOL;  Surgeon: Lin Landsman, MD;  Location: [facility] ENDOSCOPY;  Service: Gastroenterology;  Laterality: N/A;   PARTIAL HYSTERECTOMY     PORTACATH PLACEMENT N/A 09/24/2021   Procedure: INSERTION PORT-A-CATH;  Surgeon: Herbert Pun, MD;  Location: [facility] ORS;  Service: General;  Laterality: N/A;   TUBAL LIGATION      HEMATOLOGY/ONCOLOGY HISTORY:  Oncology History  Breast cancer ([place])  07/31/2021 Imaging   Bilateral diagnostic mammogram and US showed 5 centimeter LEFT breast mass associated with pleomorphic calcifications is suspicious for invasive ductal carcinoma.At least 4 LEFT axillary lymph nodes with abnormal morphology   08/21/2021 Cancer Staging   Staging form: Breast, AJCC 8th Edition - Clinical: Stage IIIB (cT4b,  cN3c, cM0, G3, ER-, PR-, HER2+) - Signed by Earlie Server, MD on 08/28/2021 Histologic grading system: 3 grade system  -08/21/21 left breast ultrasound-guided biopsy showed invasive mammary carcinoma, grade 3, ER/PR negative, HER2 positive.  Left axillary lymph node biopsy positive for macro metastatic mammary carcinoma, 8 mm in greatest extent.   08/30/2021  Imaging   MRI brain w wo contrast -No metastatic disease or acute intracranial abnormality. Essentially normal for age MRI appearance of the brain.    09/03/2021 Echocardiogram   1. Left ventricular ejection fraction, by estimation, is 55 to 60%. Left ventricular ejection fraction by 3D volume is 58 %. The left ventricle has normal function. The left ventricle has no regional wall motion abnormalities. There is mild left  ventricular hypertrophy. Left ventricular diastolic parameters were normal.  2. Right ventricular systolic function is normal. The right ventricular size is normal.  3. The mitral valve is normal in structure. No evidence of mitral valve regurgitation.  4. The aortic valve was not well visualized. Aortic valve regurgitation is not visualized   09/10/2021 Imaging   PET scan showed Large hypermetabolic left breast mass and diffuse skin thickening,consistent with primary breast carcinoma. Hypermetabolic lymphadenopathy in the left axilla, left subpectoral region, and left supraclavicular region, consistent with metastatic disease. No evidence of metastatic disease within the abdomen or pelvis   09/12/2021 -  Chemotherapy   Patient is on Treatment Plan : BREAST  Docetaxel + Carboplatin + Trastuzumab + Pertuzumab  (TCHP) q21d       Genetic Testing   Negative genetic testing. No pathogenic variants identified on the [facility] Common Hereditary Cancers+RNA panel. The report date is 10/26/2021.  The Common Hereditary Cancers Panel + RNA offered by [facility] includes sequencing and/or deletion duplication testing of the following 47 genes: APC, ATM, AXIN2, BARD1, BMPR1A, BRCA1, BRCA2, BRIP1, CDH1, CDKN2A (p14ARF), CDKN2A (p16INK4a), CKD4, CHEK2, CTNNA1, DICER1, EPCAM (Deletion/duplication testing only), GREM1 (promoter region deletion/duplication testing only), KIT, MEN1, MLH1, MSH2, MSH3, MSH6, MUTYH, NBN, NF1, NHTL1, PALB2, PDGFRA, PMS2, POLD1, POLE, PTEN, RAD50, RAD51C, RAD51D, SDHB, SDHC,  SDHD, SMAD4, SMARCA4. STK11, TP53, TSC1, TSC2, and VHL.  The following genes were evaluated for sequence changes only: SDHA and HOXB13 c.251G>A variant only.     ALLERGIES:  is allergic to bc powder [aspirin-salicylamide-caffeine], gabapentin, hydrocodone-acetaminophen, latex, penicillin g, penicillins, and shellfish allergy.  MEDICATIONS:  Current Outpatient Medications  Medication Sig Dispense Refill   albuterol (PROVENTIL) (2.5 MG/3ML) 0.083% nebulizer solution Take 3 mLs (2.5 mg total) by nebulization every 4 (four) hours as needed for wheezing or shortness of breath. 75 mL 2   albuterol (VENTOLIN HFA) 108 (90 Base) MCG/ACT inhaler Inhale 2 puffs into the lungs every 6 (six) hours as needed for wheezing or shortness of breath. 18 g 0   cetirizine (ZYRTEC ALLERGY) 10 MG tablet Take 1 tablet (10 mg total) by mouth daily. 90 tablet 0   dexamethasone (DECADRON) 4 MG tablet Take 2 tablets (8 mg total) by mouth daily. Start the day before Taxotere. Then take daily x 2 days after chemotherapy. 30 tablet 1   diphenoxylate-atropine (LOMOTIL) 2.5-0.025 MG tablet Take 1 tablet by mouth 4 (four) times daily. 60 tablet 1   FLOVENT DISKUS 250 MCG/ACT AEPB Inhale 1 puff into the lungs daily.     fluticasone (FLONASE) 50 MCG/ACT nasal spray Place 2 sprays into both nostrils daily. 15.8 mL 0   lidocaine-prilocaine (EMLA) cream Apply 1 Application topically as needed. 30 g 12   lisinopril (ZESTRIL) 10  MG tablet Take 10 mg by mouth daily.     lisinopril-hydrochlorothiazide (ZESTORETIC) 10-12.5 MG tablet Take 1 tablet by mouth daily.     loperamide (IMODIUM) 2 MG capsule Take 1 capsule (2 mg total) by mouth See admin instructions. Initial: 4 mg,the 2 mg every 2 hours (4 mg every 4 hours at night)  maximum: 16 mg/day 90 capsule 1   magic mouthwash (multi-ingredient) oral suspension Swish and spit 5-10 ml by  mouth 4 times a day as needed 400 mL 1   magic mouthwash w/lidocaine SOLN Take 5 mLs by mouth 4 (four)  times daily as needed for mouth pain. Sig: Swish/Swallow 5-10 ml four times a day as needed. Dispense 480 ml. 1RF 480 mL 1   magnesium chloride (SLOW-MAG) 64 MG TBEC SR tablet Take 1 tablet (64 mg total) by mouth daily. 30 tablet 3   montelukast (SINGULAIR) 10 MG tablet Take by mouth.     naloxone (NARCAN) nasal spray 4 mg/0.1 mL      ondansetron (ZOFRAN) 8 MG tablet Take 1 tablet (8 mg total) by mouth 2 (two) times daily as needed (Nausea or vomiting). Start on the third day after chemotherapy. 30 tablet 1   oxyCODONE-acetaminophen (PERCOCET/ROXICET) 5-325 MG tablet Take 1 tablet by mouth every 6 (six) hours as needed for pain.     pantoprazole (PROTONIX) 40 MG tablet Take 40 mg by mouth daily.     potassium chloride SA (KLOR-CON M) 20 MEQ tablet Take 1 tablet (20 mEq total) by mouth 2 (two) times daily. 60 tablet 1   prochlorperazine (COMPAZINE) 10 MG tablet Take 1 tablet (10 mg total) by mouth every 6 (six) hours as needed (Nausea or vomiting). 30 tablet 1   TRULICITY 3 DV/7.6HY SOPN Inject 0.75 mg into the skin once a week. Monday     No current facility-administered medications for this visit.    VITAL SIGNS: BP (!) 115/53   Pulse 83   Temp (!) 96.9 F (36.1 C) (Tympanic)   Resp 20  There were no vitals filed for this visit.   Estimated body mass index is 33.52 kg/m as calculated from the following:   Height as of 11/10/21: _0  (1.676 m).   Weight as of 11/26/21: 207 lb 11.2 oz (94.2 kg).  LABS: CBC:    Component Value Date/Time   WBC 3.3 (L) 11/26/2021 0813   HGB 9.1 (L) 11/26/2021 0813   HGB 11.9 (L) 05/27/2014 2215   HCT 28.5 (L) 11/26/2021 0813   HCT 36.4 05/27/2014 2215   PLT 461 (H) 11/26/2021 0813   PLT 293 05/27/2014 2215   MCV 88.5 11/26/2021 0813   MCV 81 05/27/2014 2215   NEUTROABS 2.6 11/26/2021 0813   NEUTROABS 5.4 05/27/2014 2215   LYMPHSABS 0.6 (L) 11/26/2021 0813   LYMPHSABS 3.0 05/27/2014 2215   MONOABS 0.1 11/26/2021 0813   MONOABS 0.6 05/27/2014  2215   EOSABS 0.0 11/26/2021 0813   EOSABS 0.0 05/27/2014 2215   BASOSABS 0.0 11/26/2021 0813   BASOSABS 0.0 05/27/2014 2215   Comprehensive Metabolic Panel:    Component Value Date/Time   NA 139 12/15/2021 1356   NA 143 07/08/2016 1259   NA 139 05/27/2014 2215   K 3.6 12/15/2021 1356   K 3.5 05/27/2014 2215   CL 103 12/15/2021 1356   CL 105 05/27/2014 2215   CO2 28 12/15/2021 1356   CO2 26 05/27/2014 2215   BUN 13 12/15/2021 1356   BUN 10 07/08/2016 1259  BUN 14 05/27/2014 2215   CREATININE 0.87 12/15/2021 1356   CREATININE 1.09 (H) 05/27/2014 2215   GLUCOSE 119 (H) 12/15/2021 1356   GLUCOSE 104 (H) 05/27/2014 2215   CALCIUM 9.0 12/15/2021 1356   CALCIUM 9.0 05/27/2014 2215   AST 19 11/26/2021 0813   AST < 5 (L) 08/05/2013 0832   ALT 16 11/26/2021 0813   ALT 24 08/05/2013 0832   ALKPHOS 97 11/26/2021 0813   ALKPHOS 97 08/05/2013 0832   BILITOT 0.3 11/26/2021 0813   BILITOT 0.4 07/08/2016 1259   BILITOT 0.4 08/05/2013 0832   PROT 7.2 11/26/2021 0813   PROT 7.0 07/08/2016 1259   PROT 6.8 08/05/2013 0832   ALBUMIN 3.7 11/26/2021 0813   ALBUMIN 4.0 07/08/2016 1259   ALBUMIN 3.4 08/05/2013 0832    RADIOGRAPHIC STUDIES: No results found.  PERFORMANCE STATUS (ECOG) : 1 - Symptomatic but completely ambulatory  Review of Systems Unless otherwise noted, a complete review of systems is negative.  Physical Exam General: NAD Cardiovascular: regular rate and rhythm Pulmonary: clear ant fields Abdomen: soft, nontender, + bowel sounds GU: no suprapubic tenderness Extremities: no edema, no joint deformities Skin: no rashes Neurological: Weakness but otherwise nonfocal   IMPRESSION/PLAN: Hypomagnesemia-likely due to previous diarrhea.  Patient reports that she has only taken 1 dose of Slow-Mag.  Will replete with IV today and bring patient back later this week for repeat labs/possible K and mag.  Continue oral supplementation  Hypokalemia -improved today.  Continue  oral supplementation.  RTC as scheduled to see Dr. Tasia Catchings on 11/15   Patient expressed understanding and was in agreement with this plan. She also understands that She can call clinic at any time with any questions, concerns, or complaints.   Thank you for allowing me to participate in the care of this very pleasant patient.   Time Total: 15 minutes  Visit consisted of counseling and education dealing with the complex and emotionally intense issues of symptom management in the setting of serious illness.Greater than 50%  of this time was spent counseling and coordinating care related to the above assessment and plan.  Signed by: Altha Harm, PhD, NP-C

## 2021-12-15 NOTE — Patient Instructions (Signed)
Hypomagnesemia Hypomagnesemia is a condition in which the level of magnesium in the blood is too low. Magnesium is a mineral that is found in many foods. It is used in many different processes in the body. Hypomagnesemia can affect every organ in the body. In severe cases, it can cause life-threatening problems. What are the causes? This condition may be caused by: Not getting enough magnesium in your diet or not having enough healthy foods to eat (malnutrition). Problems with magnesium absorption in the intestines. Dehydration. Excessive use of alcohol. Vomiting. Severe or long-term (chronic) diarrhea. Some medicines, including medicines that make you urinate more often (diuretics). Certain diseases, such as kidney disease, diabetes, celiac disease, and overactive thyroid. What are the signs or symptoms? Symptoms of this condition include: Loss of appetite, nausea, and vomiting. Involuntary shaking or trembling of a body part (tremor). Muscle weakness or tingling in the arms and legs. Sudden tightening of muscles (muscle spasms). Confusion. Psychiatric issues, such as: Depression and irritability. Psychosis. A feeling of fluttering of the heart (palpitations). Seizures. These symptoms are more severe if magnesium levels drop suddenly. How is this diagnosed? This condition may be diagnosed based on: Your symptoms and medical history. A physical exam. Blood and urine tests. How is this treated? Treatment depends on the cause and the severity of the condition. It may be treated by: Taking a magnesium supplement. This can be taken in pill form. If the condition is severe, magnesium is usually given through an IV. Making changes to your diet. You may be directed to eat foods that have a lot of magnesium, such as green leafy vegetables, peas, beans, and nuts. Not drinking alcohol. If you are struggling not to drink, ask your health care provider for help. Follow these instructions at  home: Eating and drinking     Make sure that your diet includes foods with magnesium. Foods that have a lot of magnesium in them include: Green leafy vegetables, such as spinach and broccoli. Beans and peas. Nuts and seeds, such as almonds and sunflower seeds. Whole grains, such as whole grain bread and fortified cereals. Drink fluids that contain salts and minerals (electrolytes), such as sports drinks, when you are active. Do not drink alcohol. General instructions Take over-the-counter and prescription medicines only as told by your health care provider. Take magnesium supplements as directed if your health care provider tells you to take them. Have your magnesium levels monitored as told by your health care provider. Keep all follow-up visits. This is important. Contact a health care provider if: You get worse instead of better. Your symptoms return. Get help right away if: You develop severe muscle weakness. You have trouble breathing. You feel that your heart is racing. These symptoms may represent a serious problem that is an emergency. Do not wait to see if the symptoms will go away. Get medical help right away. Call your local emergency services (911 in the [place] Do not drive yourself to the hospital. Summary Hypomagnesemia is a condition in which the level of magnesium in the blood is too low. Hypomagnesemia can affect every organ in the body. Treatment may include eating more foods that contain magnesium, taking magnesium supplements, and not drinking alcohol. Have your magnesium levels monitored as told by your health care provider. This information is not intended to replace advice given to you by your health care provider. Make sure you discuss any questions you have with your health care provider. Document Revised: 06/18/2020 Document Reviewed: 06/18/2020 Elsevier Patient Education    2023 [employer]  

## 2021-12-16 MED FILL — Dexamethasone Sodium Phosphate Inj 100 MG/10ML: INTRAMUSCULAR | Qty: 1 | Status: AC

## 2021-12-16 MED FILL — Fosaprepitant Dimeglumine For IV Infusion 150 MG (Base Eq): INTRAVENOUS | Qty: 5 | Status: AC

## 2021-12-17 ENCOUNTER — Inpatient Hospital Stay: Payer: Medicare HMO

## 2021-12-17 ENCOUNTER — Inpatient Hospital Stay (HOSPITAL_BASED_OUTPATIENT_CLINIC_OR_DEPARTMENT_OTHER): Payer: Medicare HMO | Admitting: Oncology

## 2021-12-17 ENCOUNTER — Encounter: Payer: Self-pay | Admitting: Oncology

## 2021-12-17 VITALS — BP 111/43 | HR 79 | Temp 97.2°F | Resp 18 | Wt 208.3 lb

## 2021-12-17 DIAGNOSIS — D6481 Anemia due to antineoplastic chemotherapy: Secondary | ICD-10-CM

## 2021-12-17 DIAGNOSIS — C50011 Malignant neoplasm of nipple and areola, right female breast: Secondary | ICD-10-CM | POA: Diagnosis not present

## 2021-12-17 DIAGNOSIS — J019 Acute sinusitis, unspecified: Secondary | ICD-10-CM | POA: Insufficient documentation

## 2021-12-17 DIAGNOSIS — L271 Localized skin eruption due to drugs and medicaments taken internally: Secondary | ICD-10-CM

## 2021-12-17 DIAGNOSIS — C50812 Malignant neoplasm of overlapping sites of left female breast: Secondary | ICD-10-CM

## 2021-12-17 DIAGNOSIS — T451X5A Adverse effect of antineoplastic and immunosuppressive drugs, initial encounter: Secondary | ICD-10-CM

## 2021-12-17 DIAGNOSIS — Z171 Estrogen receptor negative status [ER-]: Secondary | ICD-10-CM

## 2021-12-17 DIAGNOSIS — K521 Toxic gastroenteritis and colitis: Secondary | ICD-10-CM | POA: Diagnosis not present

## 2021-12-17 DIAGNOSIS — J329 Chronic sinusitis, unspecified: Secondary | ICD-10-CM | POA: Insufficient documentation

## 2021-12-17 DIAGNOSIS — J0110 Acute frontal sinusitis, unspecified: Secondary | ICD-10-CM

## 2021-12-17 LAB — CBC WITH DIFFERENTIAL/PLATELET
Abs Immature Granulocytes: 0.01 10*3/uL (ref 0.00–0.07)
Basophils Absolute: 0 10*3/uL (ref 0.0–0.1)
Basophils Relative: 0 %
Eosinophils Absolute: 0 10*3/uL (ref 0.0–0.5)
Eosinophils Relative: 0 %
HCT: 26.2 % — ABNORMAL LOW (ref 36.0–46.0)
Hemoglobin: 8.4 g/dL — ABNORMAL LOW (ref 12.0–15.0)
Immature Granulocytes: 0 %
Lymphocytes Relative: 33 %
Lymphs Abs: 2.1 10*3/uL (ref 0.7–4.0)
MCH: 28.8 pg (ref 26.0–34.0)
MCHC: 32.1 g/dL (ref 30.0–36.0)
MCV: 89.7 fL (ref 80.0–100.0)
Monocytes Absolute: 0.8 10*3/uL (ref 0.1–1.0)
Monocytes Relative: 13 %
Neutro Abs: 3.4 10*3/uL (ref 1.7–7.7)
Neutrophils Relative %: 54 %
Platelets: 103 10*3/uL — ABNORMAL LOW (ref 150–400)
RBC: 2.92 MIL/uL — ABNORMAL LOW (ref 3.87–5.11)
RDW: 19.9 % — ABNORMAL HIGH (ref 11.5–15.5)
WBC: 6.4 10*3/uL (ref 4.0–10.5)
nRBC: 0 % (ref 0.0–0.2)

## 2021-12-17 LAB — COMPREHENSIVE METABOLIC PANEL
ALT: 18 U/L (ref 0–44)
AST: 22 U/L (ref 15–41)
Albumin: 3.8 g/dL (ref 3.5–5.0)
Alkaline Phosphatase: 81 U/L (ref 38–126)
Anion gap: 7 (ref 5–15)
BUN: 21 mg/dL — ABNORMAL HIGH (ref 6–20)
CO2: 26 mmol/L (ref 22–32)
Calcium: 9.2 mg/dL (ref 8.9–10.3)
Chloride: 105 mmol/L (ref 98–111)
Creatinine, Ser: 0.98 mg/dL (ref 0.44–1.00)
GFR, Estimated: >60 mL/min (ref >60)
Glucose, Bld: 90 mg/dL (ref 70–99)
Potassium: 3.8 mmol/L (ref 3.5–5.1)
Sodium: 138 mmol/L (ref 135–145)
Total Bilirubin: 0.3 mg/dL (ref 0.3–1.2)
Total Protein: 6.9 g/dL (ref 6.5–8.1)

## 2021-12-17 LAB — MAGNESIUM: Magnesium: 1.7 mg/dL (ref 1.7–2.4)

## 2021-12-17 MED ORDER — AMOXICILLIN-POT CLAVULANATE 875-125 MG PO TABS: 1.0000 | ORAL_TABLET | Freq: Two times a day (BID) | ORAL | 0 refills | Status: DC

## 2021-12-17 MED ORDER — HEPARIN SOD (PORK) LOCK FLUSH 100 UNIT/ML IV SOLN
500.0000 [IU] | Freq: Once | INTRAVENOUS | Status: AC
  Administered 2021-12-17: 500 [IU] via INTRAVENOUS
  Filled 2021-12-17: qty 5

## 2021-12-17 NOTE — Assessment & Plan Note (Signed)
IV magnesium PRN if Mag <1.7 Continue supportive care. Continue oral Magnesium

## 2021-12-17 NOTE — Assessment & Plan Note (Signed)
Recommend patient to continue utilize Imodium and Lomotil. Symptoms has improved.

## 2021-12-17 NOTE — Progress Notes (Signed)
Hematology/Oncology Progress note [phone] [phone]         Patient Care Team: [place] as PCP - [place], [place], RN as Oncology Nurse Navigator Earlie Server, MD as Consulting Physician (Oncology)  REFERRING PROVIDER: Earlie Server, MD   ASSESSMENT & PLAN:   Cancer Staging  Breast cancer [facility]) Staging form: Breast, AJCC 8th Edition - Clinical stage from 08/21/2021: Stage IIIB (cT4b, cN3c, cM0, G3, ER-, PR-, HER2+) - Signed by Earlie Server, MD on 09/12/2021  Breast cancer [facility]) Left breast cancer, cT4b N3 Mx, ER/PR-, HER2 + Overall she tolerates chemotherapy except diarrhea. Clinically left breast mass has responded to treatment. Hold chemotherapy due to sinusitis   Anemia due to antineoplastic chemotherapy hemoglobin has decreased as expected.  Monitor counts.   Chemotherapy induced diarrhea Recommend patient to continue utilize Imodium and Lomotil. Symptoms has improved.    Hypomagnesemia IV magnesium PRN if Mag <1.7 Continue supportive care. 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 Continue to apply moisturizers and topical Neosporin.  Acute sinusitis Recommend Augmentin 885m BID x 5 days.  She reports previously taken Amoxicillin in the past and did not have allergic reaction.   No orders of the defined types were placed in this encounter.    All questions were answered. The patient knows to call the clinic with any problems questions or concerns.  Return of visit:  2 weeks. Lab MD TCHP w D3 uSula Rumple MD, [facility] 12/17/2021     CHIEF COMPLAINTS/REASON FOR VISIT:  Follow-up for breast cancer  HISTORY OF PRESENTING ILLNESS:  Vanessa BLOWERis a  61y.o.  female with PMH listed below who was referred to me for evaluation of  left breast cancer SUMMARY OF ONCOLOGIC HISTORY: Oncology History  [place]  07[place]  Imaging   Bilateral diagnostic mammogram and [place]owed 5 centimeter LEFT breast mass associated with pleomorphic calcifications is suspicious for invasive ductal carcinoma.At least 4 LEFT axillary lymph nodes with abnormal morphology   08/21/2021 Cancer Staging   Staging form: Breast, AJCC 8th Edition - Clinical: Stage IIIB (cT4b, cN3c, cM0, G3, ER-, PR-, HER2+) - Signed by YEarlie Server MD on 08/28/2021 Histologic grading system: 3 grade system  -08/21/21 left breast ultrasound-guided biopsy showed invasive mammary carcinoma, grade 3, ER/PR negative, HER2 positive.  Left axillary lymph node biopsy positive for macro metastatic mammary carcinoma, 8 mm in greatest extent.   08/30/2021 Imaging   MRI brain w wo contrast -No metastatic disease or acute intracranial abnormality. Essentially normal for age MRI appearance of the brain.    09/03/2021 Echocardiogram   1. Left ventricular ejection fraction, by estimation, is 55 to 60%. Left ventricular ejection fraction by 3D volume is 58 %. The left ventricle has normal function. The left ventricle has no regional wall motion abnormalities. There is mild left  ventricular hypertrophy. Left ventricular diastolic parameters were normal.  2. Right ventricular systolic function is normal. The right ventricular size is normal.  3. The mitral valve is normal in structure. No evidence of mitral valve regurgitation.  4. The aortic valve was not well visualized. Aortic valve regurgitation is not visualized   09/10/2021 Imaging   PET scan showed Large hypermetabolic left breast mass and diffuse skin thickening,consistent with primary breast carcinoma. Hypermetabolic lymphadenopathy in the left axilla, left subpectoral region, and left supraclavicular region, consistent with metastatic disease. No evidence of metastatic disease within the abdomen  or pelvis   09/12/2021 -  Chemotherapy   Patient is on Treatment Plan : BREAST  Docetaxel + Carboplatin + Trastuzumab +  Pertuzumab  (TCHP) q21d       Genetic Testing   Negative genetic testing. No pathogenic variants identified on the [facility] Common Hereditary Cancers+RNA panel. The report date is 10/26/2021.  The Common Hereditary Cancers Panel + RNA offered by [facility] includes sequencing and/or deletion duplication testing of the following 47 genes: APC, ATM, AXIN2, BARD1, BMPR1A, BRCA1, BRCA2, BRIP1, CDH1, CDKN2A (p14ARF), CDKN2A (p16INK4a), CKD4, CHEK2, CTNNA1, DICER1, EPCAM (Deletion/duplication testing only), GREM1 (promoter region deletion/duplication testing only), KIT, MEN1, MLH1, MSH2, MSH3, MSH6, MUTYH, NBN, NF1, NHTL1, PALB2, PDGFRA, PMS2, POLD1, POLE, PTEN, RAD50, RAD51C, RAD51D, SDHB, SDHC, SDHD, SMAD4, SMARCA4. STK11, TP53, TSC1, TSC2, and VHL.  The following genes were evaluated for sequence changes only: SDHA and HOXB13 c.251G>A variant only.     INTERVAL HISTORY Vanessa Fisher is a 61 y.o. female who has above history reviewed by me today presents for follow up visit for Triple positive breast cancer treatment.  Currently she is on neoadjuvant TCHP with GCSF support, overall she tolerates moderate difficulties. chemotherapy induced diarrhea, improved after using lomotil.  + headache , nasal congestion, facial/periorbital pressure.  No fever chills.  MEDICAL HISTORY:  Past Medical History:  Diagnosis Date   Anemia    Anxiety    Arthritis    Asthma    WELL CONTROLLED   Bile acid malabsorption syndrome    Bradycardia    HAD AN ISSUE WITH THIS IN 2016-NO PROBLEMS SINCE   Breast cancer, left breast ([place]) 08/2021   Carpal tunnel syndrome on right    Depression    Diabetes mellitus without complication (HCC)    Diverticulitis    Gastric reflux    GERD (gastroesophageal reflux disease)    Hand pain 05/12/2016   Headache    H/O MIGRAINES   History of kidney stones    H/O   Lumbar radiculitis    Neck pain, chronic    Osteoarthritis of both knees    Panic attacks     SURGICAL  HISTORY: Past Surgical History:  Procedure Laterality Date   BREAST BIOPSY Left 08/21/2021   Axilla Bx, Hydromarker, path pending   BREAST BIOPSY Left 08/21/2021   [place] Bx, Ribbon Clip, Path Pending   CARPAL TUNNEL RELEASE Right 12/06/2017   Procedure: CARPAL TUNNEL RELEASE;  Surgeon: Earnestine Leys, MD;  Location: [facility] ORS;  Service: Orthopedics;  Laterality: Right;   COLONOSCOPY WITH PROPOFOL N/A 06/11/2021   Procedure: COLONOSCOPY WITH PROPOFOL;  Surgeon: Lin Landsman, MD;  Location: [facility] ENDOSCOPY;  Service: Gastroenterology;  Laterality: N/A;   DORSAL COMPARTMENT RELEASE Right 12/06/2017   Procedure: RELEASE DORSAL COMPARTMENT (DEQUERVAIN);  Surgeon: Earnestine Leys, MD;  Location: [facility] ORS;  Service: Orthopedics;  Laterality: Right;   ESOPHAGOGASTRODUODENOSCOPY (EGD) WITH PROPOFOL N/A 06/11/2021   Procedure: ESOPHAGOGASTRODUODENOSCOPY (EGD) WITH PROPOFOL;  Surgeon: Lin Landsman, MD;  Location: [facility] ENDOSCOPY;  Service: Gastroenterology;  Laterality: N/A;   PARTIAL HYSTERECTOMY     PORTACATH PLACEMENT N/A 09/24/2021   Procedure: INSERTION PORT-A-CATH;  Surgeon: Herbert Pun, MD;  Location: [facility] ORS;  Service: General;  Laterality: N/A;   TUBAL LIGATION      SOCIAL HISTORY: Social History   Socioeconomic History   Marital status: Single    Spouse name: Not on file   Number of children: Not on file   Years of education: Not on file   Highest  education level: Not on file  Occupational History   Not on file  Tobacco Use   Smoking status: Former    Types: Cigarettes   Smokeless tobacco: Never  Vaping Use   Vaping Use: Never used  Substance and Sexual Activity   Alcohol use: No   Drug use: No   Sexual activity: Not on file  Other Topics Concern   Not on file  Social History Narrative   Lives alone   Social Determinants of Health   Financial Resource Strain: High Risk (08/15/2021)   Overall Financial Resource Strain (CARDIA)    Difficulty of Paying  Living Expenses: Very hard  Food Insecurity: Food Insecurity Present (09/09/2021)   Hunger Vital Sign    Worried About Running Out of Food in the Last Year: Often true    Ran Out of Food in the Last Year: Often true  Transportation Needs: No Transportation Needs (08/15/2021)   PRAPARE - Hydrologist (Medical): No    Lack of Transportation (Non-Medical): No  Physical Activity: Inactive (08/15/2021)   Exercise Vital Sign    Days of Exercise per Week: 0 days    Minutes of Exercise per Session: 0 min  Stress: Stress Concern Present (08/15/2021)   [place]    Feeling of Stress : Rather much  Social Connections: Socially Isolated (08/15/2021)   Social Connection and Isolation Panel [NHANES]    Frequency of Communication with Friends and Family: Once a week    Frequency of Social Gatherings with Friends and Family: Once a week    Attends Religious Services: Never    Marine scientist or Organizations: No    Attends Music therapist: Not on file    Marital Status: Never married  Intimate Partner Violence: Not At Risk (08/15/2021)   Humiliation, Afraid, Rape, and Kick questionnaire    Fear of Current or Ex-Partner: No    Emotionally Abused: No    Physically Abused: No    Sexually Abused: No    FAMILY HISTORY: Family History  Problem Relation Age of Onset   Diabetes Mother    Hypertension Mother    Heart failure Mother    Pancreatic cancer Mother 57   Diabetes Father    Hypertension Father    Breast cancer Maternal Aunt    Cervical cancer Maternal Aunt    Lung cancer Son 51    ALLERGIES:  is allergic to bc powder [aspirin-salicylamide-caffeine], gabapentin, hydrocodone-acetaminophen, latex, penicillin g, penicillins, and shellfish allergy.  MEDICATIONS:  Current Outpatient Medications  Medication Sig Dispense Refill   albuterol (VENTOLIN HFA) 108 (90 Base) MCG/ACT  inhaler Inhale 2 puffs into the lungs every 6 (six) hours as needed for wheezing or shortness of breath. 18 g 0   amoxicillin-clavulanate (AUGMENTIN) 875-125 MG tablet Take 1 tablet by mouth 2 (two) times daily. 10 tablet 0   cetirizine (ZYRTEC ALLERGY) 10 MG tablet Take 1 tablet (10 mg total) by mouth daily. 90 tablet 0   dexamethasone (DECADRON) 4 MG tablet Take 2 tablets (8 mg total) by mouth daily. Start the day before Taxotere. Then take daily x 2 days after chemotherapy. 30 tablet 1   diphenoxylate-atropine (LOMOTIL) 2.5-0.025 MG tablet Take 1 tablet by mouth 4 (four) times daily. 60 tablet 1   FLOVENT DISKUS 250 MCG/ACT AEPB Inhale 1 puff into the lungs daily.     fluticasone (FLONASE) 50 MCG/ACT nasal spray Place 2 sprays into  both nostrils daily. 15.8 mL 0   lidocaine-prilocaine (EMLA) cream Apply 1 Application topically as needed. 30 g 12   lisinopril-hydrochlorothiazide (ZESTORETIC) 10-12.5 MG tablet Take 1 tablet by mouth daily.     magnesium chloride (SLOW-MAG) 64 MG TBEC SR tablet Take 1 tablet (64 mg total) by mouth daily. 30 tablet 3   montelukast (SINGULAIR) 10 MG tablet Take by mouth.     ondansetron (ZOFRAN) 8 MG tablet Take 1 tablet (8 mg total) by mouth 2 (two) times daily as needed (Nausea or vomiting). Start on the third day after chemotherapy. 30 tablet 1   oxyCODONE-acetaminophen (PERCOCET/ROXICET) 5-325 MG tablet Take 1 tablet by mouth every 6 (six) hours as needed for pain.     pantoprazole (PROTONIX) 40 MG tablet Take 40 mg by mouth daily.     potassium chloride SA (KLOR-CON M) 20 MEQ tablet Take 1 tablet (20 mEq total) by mouth 2 (two) times daily. 60 tablet 1   TRULICITY 3 HU/8.3FG SOPN Inject 0.75 mg into the skin once a week. Monday     albuterol (PROVENTIL) (2.5 MG/3ML) 0.083% nebulizer solution Take 3 mLs (2.5 mg total) by nebulization every 4 (four) hours as needed for wheezing or shortness of breath. 75 mL 2   lisinopril (ZESTRIL) 10 MG tablet Take 10 mg by mouth  daily. (Patient not taking: Reported on 12/17/2021)     loperamide (IMODIUM) 2 MG capsule Take 1 capsule (2 mg total) by mouth See admin instructions. Initial: 4 mg,the 2 mg every 2 hours (4 mg every 4 hours at night)  maximum: 16 mg/day (Patient not taking: Reported on 12/17/2021) 90 capsule 1   magic mouthwash (multi-ingredient) oral suspension Swish and spit 5-10 ml by  mouth 4 times a day as needed (Patient not taking: Reported on 12/17/2021) 400 mL 1   magic mouthwash w/lidocaine SOLN Take 5 mLs by mouth 4 (four) times daily as needed for mouth pain. Sig: Swish/Swallow 5-10 ml four times a day as needed. Dispense 480 ml. 1RF (Patient not taking: Reported on 12/17/2021) 480 mL 1   naloxone (NARCAN) nasal spray 4 mg/0.1 mL  (Patient not taking: Reported on 12/17/2021)     prochlorperazine (COMPAZINE) 10 MG tablet Take 1 tablet (10 mg total) by mouth every 6 (six) hours as needed (Nausea or vomiting). (Patient not taking: Reported on 12/17/2021) 30 tablet 1   No current facility-administered medications for this visit.    Review of Systems  Constitutional:  Negative for appetite change, chills, fatigue and fever.  HENT:   Negative for hearing loss and voice change.        Nasal congestion, pressure  Eyes:  Negative for eye problems.  Respiratory:  Negative for chest tightness and cough.   Cardiovascular:  Negative for chest pain.  Gastrointestinal:  Negative for abdominal distention, abdominal pain and blood in stool.  Endocrine: Negative for hot flashes.  Genitourinary:  Negative for difficulty urinating and frequency.   Musculoskeletal:  Positive for arthralgias and back pain.  Skin:  Negative for itching and rash.  Neurological:  Negative for extremity weakness.  Hematological:  Negative for adenopathy.  Psychiatric/Behavioral:  Negative for confusion.        Feel stressed.     PHYSICAL EXAMINATION: ECOG PERFORMANCE STATUS: 1 - Symptomatic but completely ambulatory Vitals:    12/17/21 0906  BP: (!) 111/43  Pulse: 79  Resp: 18  Temp: (!) 97.2 F (36.2 C)   Filed Weights   12/17/21 0906  Weight:  208 lb 4.8 oz (94.5 kg)   Physical Exam Constitutional:      General: She is not in acute distress.    Appearance: She is not diaphoretic.  HENT:     Head: Normocephalic and atraumatic.     Nose: Nose normal.     Mouth/Throat:     Pharynx: No oropharyngeal exudate.  Eyes:     General: No scleral icterus.    Pupils: Pupils are equal, round, and reactive to light.  Cardiovascular:     Rate and Rhythm: Normal rate and regular rhythm.     Heart sounds: No murmur heard. Pulmonary:     Effort: Pulmonary effort is normal. No respiratory distress.     Breath sounds: No rales.  Chest:     Chest wall: No tenderness.  Abdominal:     General: There is no distension.     Palpations: Abdomen is soft.     Tenderness: There is no abdominal tenderness.  Musculoskeletal:        General: Normal range of motion.     Cervical back: Normal range of motion and neck supple.  Skin:    General: Skin is warm and dry.     Findings: No erythema.     Comments: Bilateral finger tip erythematous with small red papules and some desquamation  Neurological:     Mental Status: She is alert and oriented to person, place, and time.     Cranial Nerves: No cranial nerve deficit.     Motor: No abnormal muscle tone.     Coordination: Coordination normal.  Psychiatric:        Mood and Affect: Affect normal.   Left breast mass breast creased in size.  LABORATORY DATA:  I have reviewed the data as listed    Latest Ref Rng & Units 12/17/2021    8:51 AM 11/26/2021    8:13 AM 11/19/2021    8:42 AM  CBC  WBC 4.0 - 10.5 K/uL 6.4  3.3  2.5   Hemoglobin 12.0 - 15.0 g/dL 8.4  9.1  8.5   Hematocrit 36.0 - 46.0 % 26.2  28.5  26.2   Platelets 150 - 400 K/uL 103  461  74       Latest Ref Rng & Units 12/17/2021    8:51 AM 12/15/2021    1:56 PM 12/12/2021    8:56 AM  CMP  Glucose 70 -  99 mg/dL 90  119  103   BUN 6 - 20 mg/dL _0 Creatinine 0.44 - 1.00 mg/dL 0.98  0.87  0.91   Sodium 135 - 145 mmol/L 138  139  137   Potassium 3.5 - 5.1 mmol/L 3.8  3.6  3.0   Chloride 98 - 111 mmol/L 105  103  104   CO2 22 - 32 mmol/L _1 Calcium 8.9 - 10.3 mg/dL 9.2  9.0  8.1   Total Protein 6.5 - 8.1 g/dL 6.9     Total Bilirubin 0.3 - 1.2 mg/dL 0.3     Alkaline Phos 38 - 126 U/L 81     AST 15 - 41 U/L 22     ALT 0 - 44 U/L 18         RADIOGRAPHIC STUDIES: I have personally reviewed the radiological images as listed and agreed with the findings in the report. No results found.

## 2021-12-17 NOTE — Progress Notes (Signed)
Pt here for follow up. Pt reports she feels like she has a sinus infection but feels fine overall. She received flu and covid vaccines yesterday.

## 2021-12-17 NOTE — Assessment & Plan Note (Signed)
Likely due to taxane treatments. Continue to apply moisturizers and topical Neosporin.

## 2021-12-17 NOTE — Progress Notes (Signed)
Nutrition  RD planning to see patient during infusion today but treatment on hold for today.   RD called mobile number but no answer or option to leave voicemail.   Will follow-up.  Breely Panik B. Zenia Resides, [place], [place] Registered Dietitian [phone]

## 2021-12-17 NOTE — Assessment & Plan Note (Signed)
hemoglobin has decreased as expected.  Monitor counts.

## 2021-12-17 NOTE — Assessment & Plan Note (Signed)
Recommend Augmentin '875mg'$  BID x 5 days.  She reports previously taken Amoxicillin in the past and did not have allergic reaction.

## 2021-12-17 NOTE — Assessment & Plan Note (Addendum)
Left breast cancer, cT4b N3 Mx, ER/PR-, HER2 + Overall she tolerates chemotherapy except diarrhea. Clinically left breast mass has responded to treatment. Hold chemotherapy due to sinusitis  

## 2021-12-19 ENCOUNTER — Ambulatory Visit: Payer: Medicare HMO

## 2021-12-19 ENCOUNTER — Other Ambulatory Visit: Payer: Medicare HMO

## 2021-12-23 ENCOUNTER — Other Ambulatory Visit: Payer: Self-pay | Admitting: Oncology

## 2021-12-23 DIAGNOSIS — E876 Hypokalemia: Secondary | ICD-10-CM

## 2021-12-23 NOTE — Telephone Encounter (Signed)
Component Ref Range & Units 6 d ago (12/17/21) 8 d ago (12/15/21) 11 d ago (12/12/21) 12 d ago (12/11/21) 2 wk ago (12/04/21) 3 wk ago (12/01/21) 3 wk ago (11/28/21)  Potassium 3.5 - 5.1 mmol/L 3.8 3.6 3.0 Low  2.8 Low  3.3 Low  3.6 3.3 Low

## 2021-12-24 ENCOUNTER — Inpatient Hospital Stay: Payer: Medicare HMO

## 2021-12-24 NOTE — Progress Notes (Signed)
Nutrition Follow-up:  Patient with left breast cancer.  Patient receiving chemotherapy.    Spoke with patient via phone for nutrition follow-up.  Patient reports that she is eating.  Had some diarrhea yesterday but feels good today.  Taking lomotil and potassium.  Eating a variety of foods and getting in good sources of protein.   Medications: lomotil  Labs: reviewed  Anthropometrics:   Weight 208 lb 4.8 oz on 11/15  209 lb 216 lb on 9/6 818 lb on 5/90 (Trulicity started)   NUTRITION DIAGNOSIS: Inadequate oral intake improved    INTERVENTION:  Continue well balanced diet including good sources of protein Continue strategies to help diarrhea Continue oral nutrition supplement    MONITORING, EVALUATION, GOAL: weight trends, intake   NEXT VISIT: as needed  Alvis Edgell B. Zenia Resides, [place], [place] Registered Dietitian [phone]

## 2021-12-30 ENCOUNTER — Other Ambulatory Visit: Payer: Medicare HMO

## 2021-12-30 ENCOUNTER — Ambulatory Visit: Payer: Medicare HMO | Admitting: Oncology

## 2021-12-31 ENCOUNTER — Other Ambulatory Visit: Payer: Medicare HMO

## 2021-12-31 ENCOUNTER — Ambulatory Visit: Payer: Medicare HMO | Admitting: Oncology

## 2021-12-31 ENCOUNTER — Ambulatory Visit: Payer: Medicare HMO

## 2022-01-02 ENCOUNTER — Inpatient Hospital Stay: Payer: Medicare HMO | Attending: Oncology

## 2022-01-02 ENCOUNTER — Inpatient Hospital Stay: Payer: Medicare HMO

## 2022-01-02 ENCOUNTER — Ambulatory Visit: Payer: Medicare HMO

## 2022-01-02 ENCOUNTER — Other Ambulatory Visit: Payer: Medicare HMO

## 2022-01-02 VITALS — BP 103/61 | HR 80 | Temp 96.1°F | Resp 20

## 2022-01-02 DIAGNOSIS — Z5189 Encounter for other specified aftercare: Secondary | ICD-10-CM | POA: Diagnosis not present

## 2022-01-02 DIAGNOSIS — E876 Hypokalemia: Secondary | ICD-10-CM | POA: Diagnosis not present

## 2022-01-02 DIAGNOSIS — C50011 Malignant neoplasm of nipple and areola, right female breast: Secondary | ICD-10-CM

## 2022-01-02 DIAGNOSIS — L271 Localized skin eruption due to drugs and medicaments taken internally: Secondary | ICD-10-CM | POA: Insufficient documentation

## 2022-01-02 DIAGNOSIS — E860 Dehydration: Secondary | ICD-10-CM | POA: Diagnosis not present

## 2022-01-02 DIAGNOSIS — Z171 Estrogen receptor negative status [ER-]: Secondary | ICD-10-CM | POA: Diagnosis not present

## 2022-01-02 DIAGNOSIS — D6481 Anemia due to antineoplastic chemotherapy: Secondary | ICD-10-CM | POA: Insufficient documentation

## 2022-01-02 DIAGNOSIS — J0100 Acute maxillary sinusitis, unspecified: Secondary | ICD-10-CM | POA: Diagnosis not present

## 2022-01-02 DIAGNOSIS — Z5112 Encounter for antineoplastic immunotherapy: Secondary | ICD-10-CM | POA: Diagnosis present

## 2022-01-02 DIAGNOSIS — K219 Gastro-esophageal reflux disease without esophagitis: Secondary | ICD-10-CM | POA: Insufficient documentation

## 2022-01-02 DIAGNOSIS — Z79899 Other long term (current) drug therapy: Secondary | ICD-10-CM | POA: Diagnosis not present

## 2022-01-02 DIAGNOSIS — D709 Neutropenia, unspecified: Secondary | ICD-10-CM | POA: Diagnosis not present

## 2022-01-02 DIAGNOSIS — R197 Diarrhea, unspecified: Secondary | ICD-10-CM | POA: Diagnosis not present

## 2022-01-02 DIAGNOSIS — Z5111 Encounter for antineoplastic chemotherapy: Secondary | ICD-10-CM | POA: Insufficient documentation

## 2022-01-02 DIAGNOSIS — C50812 Malignant neoplasm of overlapping sites of left female breast: Secondary | ICD-10-CM | POA: Insufficient documentation

## 2022-01-02 DIAGNOSIS — K521 Toxic gastroenteritis and colitis: Secondary | ICD-10-CM

## 2022-01-02 LAB — BASIC METABOLIC PANEL
Anion gap: 8 (ref 5–15)
BUN: 14 mg/dL (ref 6–20)
CO2: 24 mmol/L (ref 22–32)
Calcium: 9.4 mg/dL (ref 8.9–10.3)
Chloride: 105 mmol/L (ref 98–111)
Creatinine, Ser: 0.95 mg/dL (ref 0.44–1.00)
GFR, Estimated: >60 mL/min (ref >60)
Glucose, Bld: 97 mg/dL (ref 70–99)
Potassium: 4.4 mmol/L (ref 3.5–5.1)
Sodium: 137 mmol/L (ref 135–145)

## 2022-01-02 LAB — MAGNESIUM: Magnesium: 1.5 mg/dL — ABNORMAL LOW (ref 1.7–2.4)

## 2022-01-02 MED ORDER — HEPARIN SOD (PORK) LOCK FLUSH 100 UNIT/ML IV SOLN
500.0000 [IU] | Freq: Once | INTRAVENOUS | Status: AC | PRN
  Administered 2022-01-02: 500 [IU]
  Filled 2022-01-02: qty 5

## 2022-01-02 MED ORDER — SODIUM CHLORIDE 0.9 % IV SOLN
Freq: Once | INTRAVENOUS | Status: AC
  Filled 2022-01-02: qty 250

## 2022-01-02 MED ORDER — MAGNESIUM SULFATE 2 GM/50ML IV SOLN
2.0000 g | Freq: Once | INTRAVENOUS | Status: AC
  Administered 2022-01-02: 2 g via INTRAVENOUS
  Filled 2022-01-02: qty 50

## 2022-01-02 MED ORDER — SODIUM CHLORIDE 0.9% FLUSH
10.0000 mL | Freq: Once | INTRAVENOUS | Status: AC | PRN
  Administered 2022-01-02: 10 mL
  Filled 2022-01-02: qty 10

## 2022-01-02 NOTE — Patient Instructions (Signed)
Hypomagnesemia Hypomagnesemia is a condition in which the level of magnesium in the blood is too low. Magnesium is a mineral that is found in many foods. It is used in many different processes in the body. Hypomagnesemia can affect every organ in the body. In severe cases, it can cause life-threatening problems. What are the causes? This condition may be caused by: Not getting enough magnesium in your diet or not having enough healthy foods to eat (malnutrition). Problems with magnesium absorption in the intestines. Dehydration. Excessive use of alcohol. Vomiting. Severe or long-term (chronic) diarrhea. Some medicines, including medicines that make you urinate more often (diuretics). Certain diseases, such as kidney disease, diabetes, celiac disease, and overactive thyroid. What are the signs or symptoms? Symptoms of this condition include: Loss of appetite, nausea, and vomiting. Involuntary shaking or trembling of a body part (tremor). Muscle weakness or tingling in the arms and legs. Sudden tightening of muscles (muscle spasms). Confusion. Psychiatric issues, such as: Depression and irritability. Psychosis. A feeling of fluttering of the heart (palpitations). Seizures. These symptoms are more severe if magnesium levels drop suddenly. How is this diagnosed? This condition may be diagnosed based on: Your symptoms and medical history. A physical exam. Blood and urine tests. How is this treated? Treatment depends on the cause and the severity of the condition. It may be treated by: Taking a magnesium supplement. This can be taken in pill form. If the condition is severe, magnesium is usually given through an IV. Making changes to your diet. You may be directed to eat foods that have a lot of magnesium, such as green leafy vegetables, peas, beans, and nuts. Not drinking alcohol. If you are struggling not to drink, ask your health care provider for help. Follow these instructions at  home: Eating and drinking     Make sure that your diet includes foods with magnesium. Foods that have a lot of magnesium in them include: Green leafy vegetables, such as spinach and broccoli. Beans and peas. Nuts and seeds, such as almonds and sunflower seeds. Whole grains, such as whole grain bread and fortified cereals. Drink fluids that contain salts and minerals (electrolytes), such as sports drinks, when you are active. Do not drink alcohol. General instructions Take over-the-counter and prescription medicines only as told by your health care provider. Take magnesium supplements as directed if your health care provider tells you to take them. Have your magnesium levels monitored as told by your health care provider. Keep all follow-up visits. This is important. Contact a health care provider if: You get worse instead of better. Your symptoms return. Get help right away if: You develop severe muscle weakness. You have trouble breathing. You feel that your heart is racing. These symptoms may represent a serious problem that is an emergency. Do not wait to see if the symptoms will go away. Get medical help right away. Call your local emergency services (911 in the [place] Do not drive yourself to the hospital. Summary Hypomagnesemia is a condition in which the level of magnesium in the blood is too low. Hypomagnesemia can affect every organ in the body. Treatment may include eating more foods that contain magnesium, taking magnesium supplements, and not drinking alcohol. Have your magnesium levels monitored as told by your health care provider. This information is not intended to replace advice given to you by your health care provider. Make sure you discuss any questions you have with your health care provider. Document Revised: 06/18/2020 Document Reviewed: 06/18/2020 Elsevier Patient Education    2023 [employer]  

## 2022-01-07 ENCOUNTER — Ambulatory Visit: Payer: Medicare HMO | Admitting: Oncology

## 2022-01-07 ENCOUNTER — Other Ambulatory Visit: Payer: Medicare HMO

## 2022-01-07 ENCOUNTER — Ambulatory Visit: Payer: Medicare HMO

## 2022-01-09 ENCOUNTER — Ambulatory Visit: Payer: Medicare HMO

## 2022-01-09 ENCOUNTER — Other Ambulatory Visit: Payer: Medicare HMO

## 2022-01-19 ENCOUNTER — Other Ambulatory Visit: Payer: Self-pay | Admitting: Oncology

## 2022-01-19 DIAGNOSIS — Z171 Estrogen receptor negative status [ER-]: Secondary | ICD-10-CM

## 2022-01-20 ENCOUNTER — Other Ambulatory Visit: Payer: Self-pay | Admitting: Oncology

## 2022-01-20 MED FILL — Dexamethasone Sodium Phosphate Inj 100 MG/10ML: INTRAMUSCULAR | Qty: 1 | Status: AC

## 2022-01-20 MED FILL — Fosaprepitant Dimeglumine For IV Infusion 150 MG (Base Eq): INTRAVENOUS | Qty: 5 | Status: AC

## 2022-01-21 ENCOUNTER — Inpatient Hospital Stay: Payer: Medicare HMO

## 2022-01-21 ENCOUNTER — Encounter: Payer: Self-pay | Admitting: Oncology

## 2022-01-21 ENCOUNTER — Inpatient Hospital Stay (HOSPITAL_BASED_OUTPATIENT_CLINIC_OR_DEPARTMENT_OTHER): Payer: Medicare HMO | Admitting: Oncology

## 2022-01-21 VITALS — BP 125/73 | HR 79 | Temp 97.2°F | Wt 195.0 lb

## 2022-01-21 DIAGNOSIS — T451X5A Adverse effect of antineoplastic and immunosuppressive drugs, initial encounter: Secondary | ICD-10-CM

## 2022-01-21 DIAGNOSIS — L271 Localized skin eruption due to drugs and medicaments taken internally: Secondary | ICD-10-CM

## 2022-01-21 DIAGNOSIS — K521 Toxic gastroenteritis and colitis: Secondary | ICD-10-CM

## 2022-01-21 DIAGNOSIS — Z171 Estrogen receptor negative status [ER-]: Secondary | ICD-10-CM

## 2022-01-21 DIAGNOSIS — C50011 Malignant neoplasm of nipple and areola, right female breast: Secondary | ICD-10-CM

## 2022-01-21 DIAGNOSIS — C50812 Malignant neoplasm of overlapping sites of left female breast: Secondary | ICD-10-CM

## 2022-01-21 DIAGNOSIS — E876 Hypokalemia: Secondary | ICD-10-CM

## 2022-01-21 DIAGNOSIS — D6481 Anemia due to antineoplastic chemotherapy: Secondary | ICD-10-CM | POA: Diagnosis not present

## 2022-01-21 DIAGNOSIS — Z5112 Encounter for antineoplastic immunotherapy: Secondary | ICD-10-CM | POA: Diagnosis not present

## 2022-01-21 DIAGNOSIS — Z5111 Encounter for antineoplastic chemotherapy: Secondary | ICD-10-CM

## 2022-01-21 LAB — CBC WITH DIFFERENTIAL/PLATELET
Abs Immature Granulocytes: 0 10*3/uL (ref 0.00–0.07)
Basophils Absolute: 0 10*3/uL (ref 0.0–0.1)
Basophils Relative: 1 %
Eosinophils Absolute: 0.1 10*3/uL (ref 0.0–0.5)
Eosinophils Relative: 1 %
HCT: 30.8 % — ABNORMAL LOW (ref 36.0–46.0)
Hemoglobin: 10 g/dL — ABNORMAL LOW (ref 12.0–15.0)
Immature Granulocytes: 0 %
Lymphocytes Relative: 33 %
Lymphs Abs: 1.5 10*3/uL (ref 0.7–4.0)
MCH: 29 pg (ref 26.0–34.0)
MCHC: 32.5 g/dL (ref 30.0–36.0)
MCV: 89.3 fL (ref 80.0–100.0)
Monocytes Absolute: 0.3 10*3/uL (ref 0.1–1.0)
Monocytes Relative: 8 %
Neutro Abs: 2.6 10*3/uL (ref 1.7–7.7)
Neutrophils Relative %: 57 %
Platelets: 281 10*3/uL (ref 150–400)
RBC: 3.45 MIL/uL — ABNORMAL LOW (ref 3.87–5.11)
RDW: 15.5 % (ref 11.5–15.5)
WBC: 4.5 10*3/uL (ref 4.0–10.5)
nRBC: 0 % (ref 0.0–0.2)

## 2022-01-21 LAB — COMPREHENSIVE METABOLIC PANEL
ALT: 14 U/L (ref 0–44)
AST: 18 U/L (ref 15–41)
Albumin: 4.1 g/dL (ref 3.5–5.0)
Alkaline Phosphatase: 95 U/L (ref 38–126)
Anion gap: 6 (ref 5–15)
BUN: 12 mg/dL (ref 6–20)
CO2: 25 mmol/L (ref 22–32)
Calcium: 9.2 mg/dL (ref 8.9–10.3)
Chloride: 108 mmol/L (ref 98–111)
Creatinine, Ser: 0.88 mg/dL (ref 0.44–1.00)
GFR, Estimated: >60 mL/min (ref >60)
Glucose, Bld: 107 mg/dL — ABNORMAL HIGH (ref 70–99)
Potassium: 3.8 mmol/L (ref 3.5–5.1)
Sodium: 139 mmol/L (ref 135–145)
Total Bilirubin: 0.4 mg/dL (ref 0.3–1.2)
Total Protein: 7.6 g/dL (ref 6.5–8.1)

## 2022-01-21 LAB — MAGNESIUM: Magnesium: 1.7 mg/dL (ref 1.7–2.4)

## 2022-01-21 MED ORDER — SODIUM CHLORIDE 0.9 % IV SOLN
150.0000 mg | Freq: Once | INTRAVENOUS | Status: AC
  Administered 2022-01-21: 150 mg via INTRAVENOUS
  Filled 2022-01-21: qty 150
  Filled 2022-01-21: qty 5

## 2022-01-21 MED ORDER — SODIUM CHLORIDE 0.9 % IV SOLN
420.0000 mg | Freq: Once | INTRAVENOUS | Status: AC
  Administered 2022-01-21: 420 mg via INTRAVENOUS
  Filled 2022-01-21: qty 14

## 2022-01-21 MED ORDER — SODIUM CHLORIDE 0.9 % IV SOLN
Freq: Once | INTRAVENOUS | Status: AC
  Filled 2022-01-21: qty 250

## 2022-01-21 MED ORDER — SODIUM CHLORIDE 0.9% FLUSH
10.0000 mL | Freq: Once | INTRAVENOUS | Status: AC
  Administered 2022-01-21: 10 mL via INTRAVENOUS
  Filled 2022-01-21: qty 10

## 2022-01-21 MED ORDER — SODIUM CHLORIDE 0.9 % IV SOLN
770.0000 mg | Freq: Once | INTRAVENOUS | Status: AC
  Administered 2022-01-21: 770 mg via INTRAVENOUS
  Filled 2022-01-21: qty 77

## 2022-01-21 MED ORDER — MONTELUKAST SODIUM 10 MG PO TABS
10.0000 mg | ORAL_TABLET | Freq: Once | ORAL | Status: AC
  Administered 2022-01-21: 10 mg via ORAL

## 2022-01-21 MED ORDER — SODIUM CHLORIDE 0.9 % IV SOLN
10.0000 mg | Freq: Once | INTRAVENOUS | Status: AC
  Administered 2022-01-21: 10 mg via INTRAVENOUS
  Filled 2022-01-21: qty 10
  Filled 2022-01-21: qty 1

## 2022-01-21 MED ORDER — SODIUM CHLORIDE 0.9% FLUSH
10.0000 mL | INTRAVENOUS | Status: DC | PRN
  Administered 2022-01-21: 10 mL
  Filled 2022-01-21: qty 10

## 2022-01-21 MED ORDER — DIPHENHYDRAMINE HCL 25 MG PO CAPS
50.0000 mg | ORAL_CAPSULE | Freq: Once | ORAL | Status: AC
  Administered 2022-01-21: 50 mg via ORAL
  Filled 2022-01-21: qty 2

## 2022-01-21 MED ORDER — HEPARIN SOD (PORK) LOCK FLUSH 100 UNIT/ML IV SOLN
500.0000 [IU] | Freq: Once | INTRAVENOUS | Status: AC | PRN
  Administered 2022-01-21: 500 [IU]
  Filled 2022-01-21: qty 5

## 2022-01-21 MED ORDER — ACETAMINOPHEN 325 MG PO TABS
650.0000 mg | ORAL_TABLET | Freq: Once | ORAL | Status: AC
  Administered 2022-01-21: 650 mg via ORAL
  Filled 2022-01-21: qty 2

## 2022-01-21 MED ORDER — TRASTUZUMAB-ANNS CHEMO 150 MG IV SOLR
6.0000 mg/kg | Freq: Once | INTRAVENOUS | Status: AC
  Administered 2022-01-21: 588 mg via INTRAVENOUS
  Filled 2022-01-21: qty 28

## 2022-01-21 MED ORDER — SODIUM CHLORIDE 0.9 % IV SOLN
75.0000 mg/m2 | Freq: Once | INTRAVENOUS | Status: AC
  Administered 2022-01-21: 160 mg via INTRAVENOUS
  Filled 2022-01-21: qty 16

## 2022-01-21 MED ORDER — MONTELUKAST SODIUM 10 MG PO TABS: 10.0000 mg | ORAL_TABLET | Freq: Every day | ORAL | Status: DC

## 2022-01-21 MED ORDER — PALONOSETRON HCL INJECTION 0.25 MG/5ML
0.2500 mg | Freq: Once | INTRAVENOUS | Status: AC
  Administered 2022-01-21: 0.25 mg via INTRAVENOUS
  Filled 2022-01-21: qty 5

## 2022-01-21 NOTE — Patient Instructions (Signed)
[facility] CANCER [facility] AT [place]  Discharge Instructions: Thank you for choosing [facility] to provide your oncology and hematology care.  If you have a lab appointment with the [facility], please go directly to the [facility] and check in at the registration area.  Wear comfortable clothing and clothing appropriate for easy access to any Portacath or PICC line.   We strive to give you quality time with your provider. You may need to reschedule your appointment if you arrive late (15 or more minutes).  Arriving late affects you and other patients whose appointments are after yours.  Also, if you miss three or more appointments without notifying the office, you may be dismissed from the clinic at the provider's discretion.      For prescription refill requests, have your pharmacy contact our office and allow 72 hours for refills to be completed.    Today you received the following chemotherapy and/or immunotherapy agents Kanjinti, Perjeta, Taxotere, & Paraplatin      To help prevent nausea and vomiting after your treatment, we encourage you to take your nausea medication as directed.  BELOW ARE SYMPTOMS THAT SHOULD BE REPORTED IMMEDIATELY: *FEVER GREATER THAN 100.4 F (38 C) OR HIGHER *CHILLS OR SWEATING *NAUSEA AND VOMITING THAT IS NOT CONTROLLED WITH YOUR NAUSEA MEDICATION *UNUSUAL SHORTNESS OF BREATH *UNUSUAL BRUISING OR BLEEDING *URINARY PROBLEMS (pain or burning when urinating, or frequent urination) *BOWEL PROBLEMS (unusual diarrhea, constipation, pain near the anus) TENDERNESS IN MOUTH AND THROAT WITH OR WITHOUT PRESENCE OF ULCERS (sore throat, sores in mouth, or a toothache) UNUSUAL RASH, SWELLING OR PAIN  UNUSUAL VAGINAL DISCHARGE OR ITCHING   Items with * indicate a potential emergency and should be followed up as soon as possible or go to the Emergency Department if any problems should occur.  Please show the CHEMOTHERAPY ALERT CARD or  IMMUNOTHERAPY ALERT CARD at check-in to the Emergency Department and triage nurse.  Should you have questions after your visit or need to cancel or reschedule your appointment, please contact [facility] CANCER [place] AT [place]  [phone] and follow the prompts.  Office hours are 8:00 a.m. to 4:30 p.m. Monday - Friday. Please note that voicemails left after 4:00 p.m. may not be returned until the following business day.  We are closed weekends and major holidays. You have access to a nurse at all times for urgent questions. Please call the main number to the clinic [phone] and follow the prompts.  For any non-urgent questions, you may also contact your provider using MyChart. We now offer e-Visits for anyone 12 and older to request care online for non-urgent symptoms. For details visit [email or web address].   Also download the MyChart app! Go to the app store, search "MyChart", open the app, select [facility], and log in with your MyChart username and password.  Masks are optional in the cancer centers. If you would like for your care team to wear a mask while they are taking care of you, please let them know. For doctor visits, patients may have with them one support person who is at least 59 years old. At this time, visitors are not allowed in the infusion area.

## 2022-01-21 NOTE — Assessment & Plan Note (Addendum)
Left breast cancer, cT4b N3 Mx, ER/PR-, HER2 + Overall she tolerates chemotherapy except diarrhea. Clinically left breast mass has responded to treatment. Labs are reviewed and discussed with patient. Proceed with chemotherapy TCHP, D3 Vanessa Fisher

## 2022-01-21 NOTE — Progress Notes (Signed)
Last herceptin/perjeta dose was on 10/25. Per MD patient not tolerating well, will not reload today and will continue current maintenance dosing of both medications.

## 2022-01-22 ENCOUNTER — Encounter: Payer: Self-pay | Admitting: Oncology

## 2022-01-22 NOTE — Progress Notes (Signed)
Hematology/Oncology Progress note [phone] [phone]         Patient Care Team: [place] as PCP - [place], [place], RN as Oncology Nurse Navigator Earlie Server, MD as Consulting Physician (Oncology)  REFERRING PROVIDER: Earlie Server, MD   ASSESSMENT & PLAN:   Cancer Staging  Breast cancer [facility]) Staging form: Breast, AJCC 8th Edition - Clinical stage from 08/21/2021: Stage IIIB (cT4b, cN3c, cM0, G3, ER-, PR-, HER2+) - Signed by Earlie Server, MD on 09/12/2021  Breast cancer [facility]) Left breast cancer, cT4b N3 Mx, ER/PR-, HER2 + Overall she tolerates chemotherapy except diarrhea. Clinically left breast mass has responded to treatment. Labs are reviewed and discussed with patient. Proceed with chemotherapy TCHP, D3 Udenyca     Anemia due to antineoplastic chemotherapy hemoglobin has decreased as expected.  Monitor counts.   Chemotherapy induced diarrhea Recommend patient to continue utilize Imodium and Lomotil. Weekly hydration session.    Encounter for antineoplastic chemotherapy Treatment plan as listed above  Hypokalemia continue potassium and chloride 45mq twice daily.   Hypomagnesemia IV magnesium PRN if Mag <1.7 Continue supportive care. 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 Continue to apply moisturizers and topical Neosporin.  Orders Placed This Encounter  Procedures   ECHOCARDIOGRAM COMPLETE    Standing Status:   Future    Standing Expiration Date:   01/22/2023    Order Specific Question:   Where should this test be performed    Answer:   [facility]    Order Specific Question:   Perflutren DEFINITY (image enhancing agent) should be administered unless hypersensitivity or allergy exist    Answer:   Administer Perflutren    Order Specific Question:   Reason for exam-Echo    Answer:   Chemo  [id number]     All questions were answered. The patient knows to call the  clinic with any problems questions or concerns.  Return of visit:  Per LOS  ZEarlie Server MD, PhD [facility] 01/21/2022     CHIEF COMPLAINTS/REASON FOR VISIT:  Follow-up for breast cancer  HISTORY OF PRESENTING ILLNESS:  Vanessa HEINERis a  61y.o.  female with PMH listed below who was referred to me for evaluation of  left breast cancer SUMMARY OF ONCOLOGIC HISTORY: Oncology History  [place]   Bilateral diagnostic mammogram and [place]owed 5 centimeter LEFT breast mass associated with pleomorphic calcifications is suspicious for invasive ductal carcinoma.At least 4 LEFT axillary lymph nodes with abnormal morphology   08/21/2021 Cancer Staging   Staging form: Breast, AJCC 8th Edition - Clinical: Stage IIIB (cT4b, cN3c, cM0, G3, ER-, PR-, HER2+) - Signed by YEarlie Server MD on 08/28/2021 Histologic grading system: 3 grade system  -08/21/21 left breast ultrasound-guided biopsy showed invasive mammary carcinoma, grade 3, ER/PR negative, HER2 positive.  Left axillary lymph node biopsy positive for macro metastatic mammary carcinoma, 8 mm in greatest extent.   08/30/2021 Imaging   MRI brain w wo contrast -No metastatic disease or acute intracranial abnormality. Essentially normal for age MRI appearance of the brain.    09/03/2021 Echocardiogram   1. Left ventricular ejection fraction, by estimation, is 55 to 60%. Left ventricular ejection fraction by 3D volume is 58 %. The left ventricle has normal function. The left ventricle has no regional wall motion abnormalities. There is mild left  ventricular hypertrophy. Left ventricular diastolic parameters were normal.  2. Right ventricular systolic  function is normal. The right ventricular size is normal.  3. The mitral valve is normal in structure. No evidence of mitral valve regurgitation.  4. The aortic valve was not well visualized. Aortic valve regurgitation is not visualized   09/10/2021  Imaging   PET scan showed Large hypermetabolic left breast mass and diffuse skin thickening,consistent with primary breast carcinoma. Hypermetabolic lymphadenopathy in the left axilla, left subpectoral region, and left supraclavicular region, consistent with metastatic disease. No evidence of metastatic disease within the abdomen or pelvis   09/12/2021 -  Chemotherapy   Patient is on Treatment Plan : BREAST  Docetaxel + Carboplatin + Trastuzumab + Pertuzumab  (TCHP) q21d       Genetic Testing   Negative genetic testing. No pathogenic variants identified on the [facility] Common Hereditary Cancers+RNA panel. The report date is 10/26/2021.  The Common Hereditary Cancers Panel + RNA offered by [facility] includes sequencing and/or deletion duplication testing of the following 47 genes: APC, ATM, AXIN2, BARD1, BMPR1A, BRCA1, BRCA2, BRIP1, CDH1, CDKN2A (p14ARF), CDKN2A (p16INK4a), CKD4, CHEK2, CTNNA1, DICER1, EPCAM (Deletion/duplication testing only), GREM1 (promoter region deletion/duplication testing only), KIT, MEN1, MLH1, MSH2, MSH3, MSH6, MUTYH, NBN, NF1, NHTL1, PALB2, PDGFRA, PMS2, POLD1, POLE, PTEN, RAD50, RAD51C, RAD51D, SDHB, SDHC, SDHD, SMAD4, SMARCA4. STK11, TP53, TSC1, TSC2, and VHL.  The following genes were evaluated for sequence changes only: SDHA and HOXB13 c.251G>A variant only.     INTERVAL HISTORY Vanessa Fisher is a 61 y.o. female who has above history reviewed by me today presents for follow up visit for Triple positive breast cancer treatment.  Currently she is on neoadjuvant TCHP with GCSF support, overall she tolerates moderate difficulties. chemotherapy induced diarrhea, improved after using lomotil.  There was plan of her resuming chemotherapy earlier this month. Due to scheduling problem, treatment is delayed. Due to the chemo break, she feels a lot better, almost close to her baseline. Left breast mass remains decreased in size, not growing.  No fever chills, nausea,  vomiting, diarrhea.   MEDICAL HISTORY:  Past Medical History:  Diagnosis Date   Anemia    Anxiety    Arthritis    Asthma    WELL CONTROLLED   Bile acid malabsorption syndrome    Bradycardia    HAD AN ISSUE WITH THIS IN 2016-NO PROBLEMS SINCE   Breast cancer, left breast ([facility]) 08/2021   Carpal tunnel syndrome on right    Depression    Diabetes mellitus without complication (HCC)    Diverticulitis    Gastric reflux    GERD (gastroesophageal reflux disease)    Hand pain 05/12/2016   Headache    H/O MIGRAINES   History of kidney stones    H/O   Lumbar radiculitis    Neck pain, chronic    Osteoarthritis of both knees    Panic attacks     SURGICAL HISTORY: Past Surgical History:  Procedure Laterality Date   BREAST BIOPSY Left 08/21/2021   Axilla Bx, Hydromarker, path pending   BREAST BIOPSY Left 08/21/2021   [place] Bx, Ribbon Clip, Path Pending   CARPAL TUNNEL RELEASE Right 12/06/2017   Procedure: CARPAL TUNNEL RELEASE;  Surgeon: Earnestine Leys, MD;  Location: [facility] ORS;  Service: Orthopedics;  Laterality: Right;   COLONOSCOPY WITH PROPOFOL N/A 06/11/2021   Procedure: COLONOSCOPY WITH PROPOFOL;  Surgeon: Lin Landsman, MD;  Location: [facility] ENDOSCOPY;  Service: Gastroenterology;  Laterality: N/A;   DORSAL COMPARTMENT RELEASE Right 12/06/2017   Procedure: RELEASE DORSAL COMPARTMENT (DEQUERVAIN);  Surgeon: Sabra Heck,  Nadara Mustard, MD;  Location: [facility] ORS;  Service: Orthopedics;  Laterality: Right;   ESOPHAGOGASTRODUODENOSCOPY (EGD) WITH PROPOFOL N/A 06/11/2021   Procedure: ESOPHAGOGASTRODUODENOSCOPY (EGD) WITH PROPOFOL;  Surgeon: Lin Landsman, MD;  Location: [facility] ENDOSCOPY;  Service: Gastroenterology;  Laterality: N/A;   PARTIAL HYSTERECTOMY     PORTACATH PLACEMENT N/A 09/24/2021   Procedure: INSERTION PORT-A-CATH;  Surgeon: Herbert Pun, MD;  Location: [facility] ORS;  Service: General;  Laterality: N/A;   TUBAL LIGATION      SOCIAL HISTORY: Social History    Socioeconomic History   Marital status: Single    Spouse name: Not on file   Number of children: Not on file   Years of education: Not on file   Highest education level: Not on file  Occupational History   Not on file  Tobacco Use   Smoking status: Former    Types: Cigarettes   Smokeless tobacco: Never  Vaping Use   Vaping Use: Never used  Substance and Sexual Activity   Alcohol use: No   Drug use: No   Sexual activity: Not on file  Other Topics Concern   Not on file  Social History Narrative   Lives alone   Social Determinants of Health   Financial Resource Strain: High Risk (08/15/2021)   Overall Financial Resource Strain (CARDIA)    Difficulty of Paying Living Expenses: Very hard  Food Insecurity: Food Insecurity Present (09/09/2021)   Hunger Vital Sign    Worried About Running Out of Food in the Last Year: Often true    Ran Out of Food in the Last Year: Often true  Transportation Needs: No Transportation Needs (08/15/2021)   PRAPARE - Hydrologist (Medical): No    Lack of Transportation (Non-Medical): No  Physical Activity: Inactive (08/15/2021)   Exercise Vital Sign    Days of Exercise per Week: 0 days    Minutes of Exercise per Session: 0 min  Stress: Stress Concern Present (08/15/2021)   [place]    Feeling of Stress : Rather much  Social Connections: Socially Isolated (08/15/2021)   Social Connection and Isolation Panel [NHANES]    Frequency of Communication with Friends and Family: Once a week    Frequency of Social Gatherings with Friends and Family: Once a week    Attends Religious Services: Never    Marine scientist or Organizations: No    Attends Music therapist: Not on file    Marital Status: Never married  Intimate Partner Violence: Not At Risk (08/15/2021)   Humiliation, Afraid, Rape, and Kick questionnaire    Fear of Current or  Ex-Partner: No    Emotionally Abused: No    Physically Abused: No    Sexually Abused: No    FAMILY HISTORY: Family History  Problem Relation Age of Onset   Diabetes Mother    Hypertension Mother    Heart failure Mother    Pancreatic cancer Mother 85   Diabetes Father    Hypertension Father    Breast cancer Maternal Aunt    Cervical cancer Maternal Aunt    Lung cancer Son 39    ALLERGIES:  is allergic to bc powder [aspirin-salicylamide-caffeine], gabapentin, hydrocodone-acetaminophen, latex, penicillin g, penicillins, and shellfish allergy.  MEDICATIONS:  Current Outpatient Medications  Medication Sig Dispense Refill   albuterol (PROVENTIL) (2.5 MG/3ML) 0.083% nebulizer solution Take 3 mLs (2.5 mg total) by nebulization every 4 (four) hours as needed for wheezing  or shortness of breath. 75 mL 2   albuterol (VENTOLIN HFA) 108 (90 Base) MCG/ACT inhaler Inhale 2 puffs into the lungs every 6 (six) hours as needed for wheezing or shortness of breath. 18 g 0   cetirizine (ZYRTEC ALLERGY) 10 MG tablet Take 1 tablet (10 mg total) by mouth daily. 90 tablet 0   dexamethasone (DECADRON) 4 MG tablet Take 2 tablets (8 mg total) by mouth daily. Start the day before Taxotere. Then take daily x 2 days after chemotherapy. 30 tablet 1   diphenoxylate-atropine (LOMOTIL) 2.5-0.025 MG tablet Take 1 tablet by mouth 4 (four) times daily. 60 tablet 1   FLOVENT DISKUS 250 MCG/ACT AEPB Inhale 1 puff into the lungs daily.     fluticasone (FLONASE) 50 MCG/ACT nasal spray Place 2 sprays into both nostrils daily. 15.8 mL 0   lidocaine-prilocaine (EMLA) cream Apply 1 Application topically as needed. 30 g 12   lisinopril-hydrochlorothiazide (ZESTORETIC) 10-12.5 MG tablet Take 1 tablet by mouth daily.     loperamide (IMODIUM) 2 MG capsule Take 1 capsule (2 mg total) by mouth See admin instructions. Initial: 4 mg,the 2 mg every 2 hours (4 mg every 4 hours at night)  maximum: 16 mg/day 90 capsule 1   magnesium  chloride (SLOW-MAG) 64 MG TBEC SR tablet Take 1 tablet (64 mg total) by mouth daily. 30 tablet 3   montelukast (SINGULAIR) 10 MG tablet Take by mouth.     ondansetron (ZOFRAN) 8 MG tablet Take 1 tablet (8 mg total) by mouth 2 (two) times daily as needed (Nausea or vomiting). Start on the third day after chemotherapy. 30 tablet 1   oxyCODONE-acetaminophen (PERCOCET/ROXICET) 5-325 MG tablet Take 1 tablet by mouth every 6 (six) hours as needed for pain.     pantoprazole (PROTONIX) 40 MG tablet Take 40 mg by mouth daily.     potassium chloride SA (KLOR-CON M) 20 MEQ tablet Take 1 tablet (20 mEq total) by mouth 2 (two) times daily. 60 tablet 1   lisinopril (ZESTRIL) 10 MG tablet Take 10 mg by mouth daily. (Patient not taking: Reported on 12/17/2021)     magic mouthwash (multi-ingredient) oral suspension Swish and spit 5-10 ml by  mouth 4 times a day as needed (Patient not taking: Reported on 12/17/2021) 400 mL 1   naloxone (NARCAN) nasal spray 4 mg/0.1 mL  (Patient not taking: Reported on 12/17/2021)     prochlorperazine (COMPAZINE) 10 MG tablet Take 1 tablet (10 mg total) by mouth every 6 (six) hours as needed (Nausea or vomiting). (Patient not taking: Reported on 12/17/2021) 30 tablet 1   TRULICITY 3 OT/1.5BW SOPN Inject 0.75 mg into the skin once a week. Monday (Patient not taking: Reported on 01/21/2022)     No current facility-administered medications for this visit.    Review of Systems  Constitutional:  Negative for appetite change, chills, fatigue and fever.  HENT:   Negative for hearing loss and voice change.        Nasal congestion, pressure  Eyes:  Negative for eye problems.  Respiratory:  Negative for chest tightness and cough.   Cardiovascular:  Negative for chest pain.  Gastrointestinal:  Negative for abdominal distention, abdominal pain and blood in stool.  Endocrine: Negative for hot flashes.  Genitourinary:  Negative for difficulty urinating and frequency.   Musculoskeletal:   Positive for arthralgias and back pain.  Skin:  Negative for itching and rash.  Neurological:  Negative for extremity weakness.  Hematological:  Negative for adenopathy.  Psychiatric/Behavioral:  Negative for confusion.        Feel stressed.     PHYSICAL EXAMINATION: ECOG PERFORMANCE STATUS: 1 - Symptomatic but completely ambulatory Vitals:   01/21/22 0856  BP: 125/73  Pulse: 79  Temp: (!) 97.2 F (36.2 C)  SpO2: 100%   Filed Weights   01/21/22 0856  Weight: 195 lb (88.5 kg)   Physical Exam Constitutional:      General: She is not in acute distress.    Appearance: She is not diaphoretic.  HENT:     Head: Normocephalic and atraumatic.     Nose: Nose normal.     Mouth/Throat:     Pharynx: No oropharyngeal exudate.  Eyes:     General: No scleral icterus.    Pupils: Pupils are equal, round, and reactive to light.  Cardiovascular:     Rate and Rhythm: Normal rate and regular rhythm.     Heart sounds: No murmur heard. Pulmonary:     Effort: Pulmonary effort is normal. No respiratory distress.     Breath sounds: No rales.  Chest:     Chest wall: No tenderness.  Abdominal:     General: There is no distension.     Palpations: Abdomen is soft.     Tenderness: There is no abdominal tenderness.  Musculoskeletal:        General: Normal range of motion.     Cervical back: Normal range of motion and neck supple.  Skin:    General: Skin is warm and dry.     Findings: No erythema.     Comments: Bilateral finger tip erythematous with small red papules and some desquamation  Neurological:     Mental Status: She is alert and oriented to person, place, and time.     Cranial Nerves: No cranial nerve deficit.     Motor: No abnormal muscle tone.     Coordination: Coordination normal.  Psychiatric:        Mood and Affect: Affect normal.   Left breast mass breast creased in size.  LABORATORY DATA:  I have reviewed the data as listed    Latest Ref Rng & Units 01/21/2022     8:43 AM 12/17/2021    8:51 AM 11/26/2021    8:13 AM  CBC  WBC 4.0 - 10.5 K/uL 4.5  6.4  3.3   Hemoglobin 12.0 - 15.0 g/dL 10.0  8.4  9.1   Hematocrit 36.0 - 46.0 % 30.8  26.2  28.5   Platelets 150 - 400 K/uL 281  103  461       Latest Ref Rng & Units 01/21/2022    8:43 AM 01/02/2022   10:34 AM 12/17/2021    8:51 AM  CMP  Glucose 70 - 99 mg/dL 107  97  90   BUN 6 - 20 mg/dL _0 Creatinine 0.44 - 1.00 mg/dL 0.88  0.95  0.98   Sodium 135 - 145 mmol/L 139  137  138   Potassium 3.5 - 5.1 mmol/L 3.8  4.4  3.8   Chloride 98 - 111 mmol/L 108  105  105   CO2 22 - 32 mmol/L _1 Calcium 8.9 - 10.3 mg/dL 9.2  9.4  9.2   Total Protein 6.5 - 8.1 g/dL 7.6   6.9   Total Bilirubin 0.3 - 1.2 mg/dL 0.4   0.3   Alkaline Phos 38 - 126 U/L 95   81  AST 15 - 41 U/L 18   22   ALT 0 - 44 U/L 14   18       RADIOGRAPHIC STUDIES: I have personally reviewed the radiological images as listed and agreed with the findings in the report. No results found.

## 2022-01-22 NOTE — Assessment & Plan Note (Signed)
IV magnesium PRN if Mag <1.7 Continue supportive care. Continue oral Magnesium

## 2022-01-22 NOTE — Assessment & Plan Note (Signed)
hemoglobin has decreased as expected.  Monitor counts.

## 2022-01-22 NOTE — Assessment & Plan Note (Signed)
Likely due to taxane treatments. Continue to apply moisturizers and topical Neosporin.

## 2022-01-22 NOTE — Assessment & Plan Note (Signed)
Recommend patient to continue utilize Imodium and Lomotil. Weekly hydration session.

## 2022-01-22 NOTE — Assessment & Plan Note (Signed)
continue potassium and chloride 77mq twice daily.

## 2022-01-22 NOTE — Assessment & Plan Note (Signed)
Treatment plan as listed above. 

## 2022-01-23 ENCOUNTER — Inpatient Hospital Stay: Payer: Medicare HMO

## 2022-01-23 ENCOUNTER — Other Ambulatory Visit: Payer: Medicare HMO

## 2022-01-23 ENCOUNTER — Ambulatory Visit: Payer: Medicare HMO

## 2022-01-23 VITALS — BP 95/59 | HR 71 | Temp 98.0°F | Resp 20

## 2022-01-23 DIAGNOSIS — Z5112 Encounter for antineoplastic immunotherapy: Secondary | ICD-10-CM | POA: Diagnosis not present

## 2022-01-23 DIAGNOSIS — Z171 Estrogen receptor negative status [ER-]: Secondary | ICD-10-CM

## 2022-01-23 MED ORDER — SODIUM CHLORIDE 0.9 % IV SOLN
INTRAVENOUS | Status: DC
  Filled 2022-01-23 (×2): qty 250

## 2022-01-23 MED ORDER — HEPARIN SOD (PORK) LOCK FLUSH 100 UNIT/ML IV SOLN
500.0000 [IU] | Freq: Once | INTRAVENOUS | Status: AC
  Administered 2022-01-23: 500 [IU] via INTRAVENOUS
  Filled 2022-01-23: qty 5

## 2022-01-23 MED ORDER — SODIUM CHLORIDE 0.9% FLUSH
10.0000 mL | Freq: Once | INTRAVENOUS | Status: AC
  Administered 2022-01-23: 10 mL via INTRAVENOUS
  Filled 2022-01-23: qty 10

## 2022-01-23 MED ORDER — PEGFILGRASTIM-CBQV 6 MG/0.6ML ~~LOC~~ SOSY
6.0000 mg | PREFILLED_SYRINGE | Freq: Once | SUBCUTANEOUS | Status: AC
  Administered 2022-01-23: 6 mg via SUBCUTANEOUS
  Filled 2022-01-23: qty 0.6

## 2022-01-27 ENCOUNTER — Inpatient Hospital Stay: Payer: Medicare HMO

## 2022-01-27 ENCOUNTER — Inpatient Hospital Stay (HOSPITAL_BASED_OUTPATIENT_CLINIC_OR_DEPARTMENT_OTHER): Payer: Medicare HMO | Admitting: Medical Oncology

## 2022-01-27 ENCOUNTER — Ambulatory Visit
Admission: RE | Admit: 2022-01-27 | Discharge: 2022-01-27 | Disposition: A | Discharge status: Home / Self Care | Payer: Medicare HMO | Source: Ambulatory Visit | Attending: Medical Oncology | Admitting: Medical Oncology

## 2022-01-27 ENCOUNTER — Other Ambulatory Visit: Payer: Self-pay

## 2022-01-27 ENCOUNTER — Telehealth: Payer: Self-pay | Admitting: *Deleted

## 2022-01-27 VITALS — BP 112/42 | HR 74 | Temp 96.6°F

## 2022-01-27 DIAGNOSIS — R051 Acute cough: Secondary | ICD-10-CM

## 2022-01-27 DIAGNOSIS — E876 Hypokalemia: Secondary | ICD-10-CM

## 2022-01-27 DIAGNOSIS — E860 Dehydration: Secondary | ICD-10-CM

## 2022-01-27 DIAGNOSIS — R197 Diarrhea, unspecified: Secondary | ICD-10-CM

## 2022-01-27 DIAGNOSIS — J398 Other specified diseases of upper respiratory tract: Secondary | ICD-10-CM

## 2022-01-27 DIAGNOSIS — D709 Neutropenia, unspecified: Secondary | ICD-10-CM | POA: Diagnosis not present

## 2022-01-27 DIAGNOSIS — Z5112 Encounter for antineoplastic immunotherapy: Secondary | ICD-10-CM | POA: Diagnosis not present

## 2022-01-27 DIAGNOSIS — Z95828 Presence of other vascular implants and grafts: Secondary | ICD-10-CM

## 2022-01-27 DIAGNOSIS — J0100 Acute maxillary sinusitis, unspecified: Secondary | ICD-10-CM

## 2022-01-27 DIAGNOSIS — K219 Gastro-esophageal reflux disease without esophagitis: Secondary | ICD-10-CM

## 2022-01-27 LAB — CBC WITH DIFFERENTIAL/PLATELET
Abs Immature Granulocytes: 0.03 10*3/uL (ref 0.00–0.07)
Basophils Absolute: 0 10*3/uL (ref 0.0–0.1)
Basophils Relative: 1 %
Eosinophils Absolute: 0 10*3/uL (ref 0.0–0.5)
Eosinophils Relative: 1 %
HCT: 29.4 % — ABNORMAL LOW (ref 36.0–46.0)
Hemoglobin: 9.8 g/dL — ABNORMAL LOW (ref 12.0–15.0)
Immature Granulocytes: 2 %
Lymphocytes Relative: 59 %
Lymphs Abs: 1.1 10*3/uL (ref 0.7–4.0)
MCH: 29.2 pg (ref 26.0–34.0)
MCHC: 33.3 g/dL (ref 30.0–36.0)
MCV: 87.5 fL (ref 80.0–100.0)
Monocytes Absolute: 0.2 10*3/uL (ref 0.1–1.0)
Monocytes Relative: 8 %
Neutro Abs: 0.5 10*3/uL — ABNORMAL LOW (ref 1.7–7.7)
Neutrophils Relative %: 29 %
Platelets: 157 10*3/uL (ref 150–400)
RBC: 3.36 MIL/uL — ABNORMAL LOW (ref 3.87–5.11)
RDW: 14.6 % (ref 11.5–15.5)
Smear Review: NORMAL
WBC: 1.9 10*3/uL — ABNORMAL LOW (ref 4.0–10.5)
nRBC: 0 % (ref 0.0–0.2)

## 2022-01-27 LAB — MAGNESIUM: Magnesium: 1.5 mg/dL — ABNORMAL LOW (ref 1.7–2.4)

## 2022-01-27 LAB — COMPREHENSIVE METABOLIC PANEL
ALT: 21 U/L (ref 0–44)
AST: 23 U/L (ref 15–41)
Albumin: 4.1 g/dL (ref 3.5–5.0)
Alkaline Phosphatase: 95 U/L (ref 38–126)
Anion gap: 8 (ref 5–15)
BUN: 16 mg/dL (ref 6–20)
CO2: 25 mmol/L (ref 22–32)
Calcium: 8.6 mg/dL — ABNORMAL LOW (ref 8.9–10.3)
Chloride: 102 mmol/L (ref 98–111)
Creatinine, Ser: 0.69 mg/dL (ref 0.44–1.00)
GFR, Estimated: >60 mL/min (ref >60)
Glucose, Bld: 101 mg/dL — ABNORMAL HIGH (ref 70–99)
Potassium: 3.6 mmol/L (ref 3.5–5.1)
Sodium: 135 mmol/L (ref 135–145)
Total Bilirubin: 0.6 mg/dL (ref 0.3–1.2)
Total Protein: 7.3 g/dL (ref 6.5–8.1)

## 2022-01-27 MED ORDER — HEPARIN SOD (PORK) LOCK FLUSH 100 UNIT/ML IV SOLN
500.0000 [IU] | Freq: Once | INTRAVENOUS | Status: AC
  Administered 2022-01-27: 500 [IU] via INTRAVENOUS
  Filled 2022-01-27: qty 5

## 2022-01-27 MED ORDER — LEVOFLOXACIN 750 MG PO TABS: 750.0000 mg | ORAL_TABLET | Freq: Every day | ORAL | 0 refills | Status: AC

## 2022-01-27 MED ORDER — SODIUM CHLORIDE 0.9% FLUSH
10.0000 mL | Freq: Once | INTRAVENOUS | Status: AC
  Administered 2022-01-27: 10 mL via INTRAVENOUS
  Filled 2022-01-27: qty 10

## 2022-01-27 MED ORDER — SODIUM CHLORIDE 0.9 % IV SOLN
INTRAVENOUS | Status: DC
  Filled 2022-01-27 (×2): qty 250

## 2022-01-27 MED ORDER — MAGNESIUM SULFATE 2 GM/50ML IV SOLN
2.0000 g | Freq: Once | INTRAVENOUS | Status: AC
  Administered 2022-01-27: 2 g via INTRAVENOUS
  Filled 2022-01-27: qty 50

## 2022-01-27 MED ORDER — ESOMEPRAZOLE MAGNESIUM 40 MG PO CPDR: 40.0000 mg | DELAYED_RELEASE_CAPSULE | Freq: Every day | ORAL | 3 refills | Status: DC

## 2022-01-27 MED ORDER — FLUCONAZOLE 150 MG PO TABS: 150.0000 mg | ORAL_TABLET | Freq: Every day | ORAL | 0 refills | Status: DC

## 2022-01-27 MED ORDER — DOXYCYCLINE MONOHYDRATE 100 MG PO CAPS: 100.0000 mg | ORAL_CAPSULE | Freq: Two times a day (BID) | ORAL | 0 refills | Status: DC

## 2022-01-27 NOTE — Telephone Encounter (Signed)
Patient called reporting that she has had diarrhea and congestion since she was last here. She has been using Slomax and Imodium for the diarrhea and has green drainage from her sinuses. She states she has tested negative for Flu and COVID. She does not have a fever, but feels cold.She states that her chest hurts especially when she burps. She is asking if she can be seen today. She also feels that her potassium level is low. Please advise

## 2022-01-27 NOTE — Addendum Note (Signed)
Addended by: Gloris Ham on: 01/27/2022 09:59 AM   Modules accepted: Orders

## 2022-01-27 NOTE — Progress Notes (Signed)
Symptom Management [place] at [facility] [phone] [phone]  Patient Care Team: [place] as PCP - General Daiva Huge, RN as Oncology Nurse Navigator Earlie Server, MD as Consulting Physician (Oncology)   Name of the patient: Vanessa Fisher  [id number]  1960-03-18   Date of visit: 01/27/22  Reason for Consult: Vanessa Fisher is a 61 y.o. female with stage IIIB ER/PR-, HER2+ left breast cancer currently on TCHP + Udenyca s/p cycle 5 on 01/21/2022 who presents today for:   Cough: Patient states that she has had a cough and diarrhea for the past roughly 6 days. Started after she was seen last in clinic and worsened after she had her Udencya administered on 01/23/2022. She reports that the cough is mild but her sinusitis is one of her worst symptoms. Feels like a sinus infection which she has had before. She has had fatigue, chills, and general malaise. Additionally she is having diarrhea and GERD. Reporting less than 10 episodes of watery stools per day. No vomiting, nausea, urinary symptoms, ABD pain or bloody movements. She has tried Coca-Cola and Imodium for her diarrhea with the imodium working fairly well success. She does report that it is not unusual for her to get the diarrhea side effect after Vanessa Fisher- this time is just a bit more loose than normal. Eating and drinking a bit less than normal due to GERD.   No SOB, fever.    Wt Readings from Last 3 Encounters:  01/21/22 195 lb (88.5 kg)  12/17/21 208 lb 4.8 oz (94.5 kg)  11/26/21 207 lb 11.2 oz (94.2 kg)    PAST MEDICAL HISTORY: Past Medical History:  Diagnosis Date   Anemia    Anxiety    Arthritis    Asthma    WELL CONTROLLED   Bile acid malabsorption syndrome    Bradycardia    HAD AN ISSUE WITH THIS IN 2016-NO PROBLEMS SINCE   Breast cancer, left breast ([place]) 08/2021   Carpal tunnel syndrome on right    Depression    Diabetes mellitus without  complication (HCC)    Diverticulitis    Gastric reflux    GERD (gastroesophageal reflux disease)    Hand pain 05/12/2016   Headache    H/O MIGRAINES   History of kidney stones    H/O   Lumbar radiculitis    Neck pain, chronic    Osteoarthritis of both knees    Panic attacks     PAST SURGICAL HISTORY:  Past Surgical History:  Procedure Laterality Date   BREAST BIOPSY Left 08/21/2021   Axilla Bx, Hydromarker, path pending   BREAST BIOPSY Left 08/21/2021   [place] Bx, Ribbon Clip, Path Pending   CARPAL TUNNEL RELEASE Right 12/06/2017   Procedure: CARPAL TUNNEL RELEASE;  Surgeon: Earnestine Leys, MD;  Location: [facility] ORS;  Service: Orthopedics;  Laterality: Right;   COLONOSCOPY WITH PROPOFOL N/A 06/11/2021   Procedure: COLONOSCOPY WITH PROPOFOL;  Surgeon: Lin Landsman, MD;  Location: [facility] ENDOSCOPY;  Service: Gastroenterology;  Laterality: N/A;   DORSAL COMPARTMENT RELEASE Right 12/06/2017   Procedure: RELEASE DORSAL COMPARTMENT (DEQUERVAIN);  Surgeon: Earnestine Leys, MD;  Location: [facility] ORS;  Service: Orthopedics;  Laterality: Right;   ESOPHAGOGASTRODUODENOSCOPY (EGD) WITH PROPOFOL N/A 06/11/2021   Procedure: ESOPHAGOGASTRODUODENOSCOPY (EGD) WITH PROPOFOL;  Surgeon: Lin Landsman, MD;  Location: [facility] ENDOSCOPY;  Service: Gastroenterology;  Laterality: N/A;   PARTIAL HYSTERECTOMY     PORTACATH PLACEMENT N/A 09/24/2021   Procedure:  INSERTION PORT-A-CATH;  Surgeon: Herbert Pun, MD;  Location: [facility] ORS;  Service: General;  Laterality: N/A;   TUBAL LIGATION      HEMATOLOGY/ONCOLOGY HISTORY:  Oncology History  Breast cancer ([place])  07/31/2021 Imaging   Bilateral diagnostic mammogram and US showed 5 centimeter LEFT breast mass associated with pleomorphic calcifications is suspicious for invasive ductal carcinoma.At least 4 LEFT axillary lymph nodes with abnormal morphology   08/21/2021 Cancer Staging   Staging form: Breast, AJCC 8th Edition - Clinical: Stage IIIB (cT4b,  cN3c, cM0, G3, ER-, PR-, HER2+) - Signed by Earlie Server, MD on 08/28/2021 Histologic grading system: 3 grade system  -08/21/21 left breast ultrasound-guided biopsy showed invasive mammary carcinoma, grade 3, ER/PR negative, HER2 positive.  Left axillary lymph node biopsy positive for macro metastatic mammary carcinoma, 8 mm in greatest extent.   08/30/2021 Imaging   MRI brain w wo contrast -No metastatic disease or acute intracranial abnormality. Essentially normal for age MRI appearance of the brain.    09/03/2021 Echocardiogram   1. Left ventricular ejection fraction, by estimation, is 55 to 60%. Left ventricular ejection fraction by 3D volume is 58 %. The left ventricle has normal function. The left ventricle has no regional wall motion abnormalities. There is mild left  ventricular hypertrophy. Left ventricular diastolic parameters were normal.  2. Right ventricular systolic function is normal. The right ventricular size is normal.  3. The mitral valve is normal in structure. No evidence of mitral valve regurgitation.  4. The aortic valve was not well visualized. Aortic valve regurgitation is not visualized   09/10/2021 Imaging   PET scan showed Large hypermetabolic left breast mass and diffuse skin thickening,consistent with primary breast carcinoma. Hypermetabolic lymphadenopathy in the left axilla, left subpectoral region, and left supraclavicular region, consistent with metastatic disease. No evidence of metastatic disease within the abdomen or pelvis   09/12/2021 -  Chemotherapy   Patient is on Treatment Plan : BREAST  Docetaxel + Carboplatin + Trastuzumab + Pertuzumab  (TCHP) q21d       Genetic Testing   Negative genetic testing. No pathogenic variants identified on the [facility] Common Hereditary Cancers+RNA panel. The report date is 10/26/2021.  The Common Hereditary Cancers Panel + RNA offered by [facility] includes sequencing and/or deletion duplication testing of the following 47 genes:  APC, ATM, AXIN2, BARD1, BMPR1A, BRCA1, BRCA2, BRIP1, CDH1, CDKN2A (p14ARF), CDKN2A (p16INK4a), CKD4, CHEK2, CTNNA1, DICER1, EPCAM (Deletion/duplication testing only), GREM1 (promoter region deletion/duplication testing only), KIT, MEN1, MLH1, MSH2, MSH3, MSH6, MUTYH, NBN, NF1, NHTL1, PALB2, PDGFRA, PMS2, POLD1, POLE, PTEN, RAD50, RAD51C, RAD51D, SDHB, SDHC, SDHD, SMAD4, SMARCA4. STK11, TP53, TSC1, TSC2, and VHL.  The following genes were evaluated for sequence changes only: SDHA and HOXB13 c.251G>A variant only.     ALLERGIES:  is allergic to bc powder [aspirin-salicylamide-caffeine], gabapentin, hydrocodone-acetaminophen, latex, penicillin g, penicillins, and shellfish allergy.  MEDICATIONS:  Current Outpatient Medications  Medication Sig Dispense Refill   albuterol (VENTOLIN HFA) 108 (90 Base) MCG/ACT inhaler Inhale 2 puffs into the lungs every 6 (six) hours as needed for wheezing or shortness of breath. 18 g 0   cetirizine (ZYRTEC ALLERGY) 10 MG tablet Take 1 tablet (10 mg total) by mouth daily. 90 tablet 0   diphenoxylate-atropine (LOMOTIL) 2.5-0.025 MG tablet Take 1 tablet by mouth 4 (four) times daily. 60 tablet 1   esomeprazole (NEXIUM) 40 MG capsule Take 1 capsule (40 mg total) by mouth daily at 12 noon. 30 capsule 3   FLOVENT DISKUS 250 MCG/ACT AEPB  Inhale 1 puff into the lungs daily.     fluconazole (DIFLUCAN) 150 MG tablet Take 1 tablet (150 mg total) by mouth daily. Take after you have finished your antibiotics 1 tablet 0   fluticasone (FLONASE) 50 MCG/ACT nasal spray Place 2 sprays into both nostrils daily. 15.8 mL 0   levofloxacin (LEVAQUIN) 750 MG tablet Take 1 tablet (750 mg total) by mouth daily for 5 days. 5 tablet 0   lidocaine-prilocaine (EMLA) cream Apply 1 Application topically as needed. 30 g 12   lisinopril (ZESTRIL) 10 MG tablet Take 10 mg by mouth daily.     loperamide (IMODIUM) 2 MG capsule Take 1 capsule (2 mg total) by mouth See admin instructions. Initial: 4 mg,the 2  mg every 2 hours (4 mg every 4 hours at night)  maximum: 16 mg/day 90 capsule 1   magnesium chloride (SLOW-MAG) 64 MG TBEC SR tablet Take 1 tablet (64 mg total) by mouth daily. 30 tablet 3   montelukast (SINGULAIR) 10 MG tablet Take by mouth.     ondansetron (ZOFRAN) 8 MG tablet Take 1 tablet (8 mg total) by mouth 2 (two) times daily as needed (Nausea or vomiting). Start on the third day after chemotherapy. 30 tablet 1   oxyCODONE-acetaminophen (PERCOCET/ROXICET) 5-325 MG tablet Take 1 tablet by mouth every 6 (six) hours as needed for pain.     potassium chloride SA (KLOR-CON M) 20 MEQ tablet Take 1 tablet (20 mEq total) by mouth 2 (two) times daily. 60 tablet 1   zinc gluconate 50 MG tablet Take 50 mg by mouth daily.     albuterol (PROVENTIL) (2.5 MG/3ML) 0.083% nebulizer solution Take 3 mLs (2.5 mg total) by nebulization every 4 (four) hours as needed for wheezing or shortness of breath. (Patient not taking: Reported on 01/27/2022) 75 mL 2   dexamethasone (DECADRON) 4 MG tablet Take 2 tablets (8 mg total) by mouth daily. Start the day before Taxotere. Then take daily x 2 days after chemotherapy. (Patient not taking: Reported on 01/27/2022) 30 tablet 1   lisinopril-hydrochlorothiazide (ZESTORETIC) 10-12.5 MG tablet Take 1 tablet by mouth daily. (Patient not taking: Reported on 01/27/2022)     magic mouthwash (multi-ingredient) oral suspension Swish and spit 5-10 ml by  mouth 4 times a day as needed (Patient not taking: Reported on 01/27/2022) 400 mL 1   naloxone (NARCAN) nasal spray 4 mg/0.1 mL  (Patient not taking: Reported on 12/17/2021)     prochlorperazine (COMPAZINE) 10 MG tablet Take 1 tablet (10 mg total) by mouth every 6 (six) hours as needed (Nausea or vomiting). (Patient not taking: Reported on 12/17/2021) 30 tablet 1   No current facility-administered medications for this visit.   Facility-Administered Medications Ordered in Other Visits  Medication Dose Route Frequency Provider Last  Rate Last Admin   0.9 %  sodium chloride infusion   Intravenous Continuous Hughie Closs, PA-C   Stopped at 01/27/22 1252    VITAL SIGNS: BP (!) 112/42   Pulse 74   Temp (!) 96.6 F (35.9 C) (Tympanic)   SpO2 100%  There were no vitals filed for this visit.  Estimated body mass index is 31.47 kg/m as calculated from the following:   Height as of 11/10/21: _0  (1.676 m).   Weight as of 01/21/22: 195 lb (88.5 kg).  LABS: CBC:    Component Value Date/Time   WBC 1.9 (L) 01/27/2022 1108   HGB 9.8 (L) 01/27/2022 1108   HGB 11.9 (L) 05/27/2014 2215  HCT 29.4 (L) 01/27/2022 1108   HCT 36.4 05/27/2014 2215   PLT 157 01/27/2022 1108   PLT 293 05/27/2014 2215   MCV 87.5 01/27/2022 1108   MCV 81 05/27/2014 2215   NEUTROABS 0.5 (L) 01/27/2022 1108   NEUTROABS 5.4 05/27/2014 2215   LYMPHSABS 1.1 01/27/2022 1108   LYMPHSABS 3.0 05/27/2014 2215   MONOABS 0.2 01/27/2022 1108   MONOABS 0.6 05/27/2014 2215   EOSABS 0.0 01/27/2022 1108   EOSABS 0.0 05/27/2014 2215   BASOSABS 0.0 01/27/2022 1108   BASOSABS 0.0 05/27/2014 2215   Comprehensive Metabolic Panel:    Component Value Date/Time   NA 135 01/27/2022 1108   NA 143 07/08/2016 1259   NA 139 05/27/2014 2215   K 3.6 01/27/2022 1108   K 3.5 05/27/2014 2215   CL 102 01/27/2022 1108   CL 105 05/27/2014 2215   CO2 25 01/27/2022 1108   CO2 26 05/27/2014 2215   BUN 16 01/27/2022 1108   BUN 10 07/08/2016 1259   BUN 14 05/27/2014 2215   CREATININE 0.69 01/27/2022 1108   CREATININE 1.09 (H) 05/27/2014 2215   GLUCOSE 101 (H) 01/27/2022 1108   GLUCOSE 104 (H) 05/27/2014 2215   CALCIUM 8.6 (L) 01/27/2022 1108   CALCIUM 9.0 05/27/2014 2215   AST 23 01/27/2022 1108   AST < 5 (L) 08/05/2013 0832   ALT 21 01/27/2022 1108   ALT 24 08/05/2013 0832   ALKPHOS 95 01/27/2022 1108   ALKPHOS 97 08/05/2013 0832   BILITOT 0.6 01/27/2022 1108   BILITOT 0.4 07/08/2016 1259   BILITOT 0.4 08/05/2013 0832   PROT 7.3 01/27/2022 1108   PROT  7.0 07/08/2016 1259   PROT 6.8 08/05/2013 0832   ALBUMIN 4.1 01/27/2022 1108   ALBUMIN 4.0 07/08/2016 1259   ALBUMIN 3.4 08/05/2013 0832    RADIOGRAPHIC STUDIES: DG Chest 2 View  Result Date: 01/27/2022 CLINICAL DATA:  Cough, congestion EXAM: CHEST - 2 VIEW COMPARISON:  09/24/2021 FINDINGS: Right-sided Port-A-Cath with the tip projecting over the SVC. No focal consolidation. No pleural effusion or pneumothorax. Heart and mediastinal contours are unremarkable. No acute osseous abnormality. IMPRESSION: No active cardiopulmonary disease. Electronically Signed   By: Kathreen Devoid M.D.   On: 01/27/2022 10:32    PERFORMANCE STATUS (ECOG) : 1 - Symptomatic but completely ambulatory  Review of Systems Unless otherwise noted, a complete review of systems is negative.  Physical Exam General: NAD HEENT: Maxillary bogginess of the right sinus with mild to moderate tenderness of sinuses throughout. Nasal passages with erythema and scant yellow/green discharge  Cardiovascular: regular rate and rhythm Pulmonary: clear ant fields without wheeze or rhonchi Abdomen: soft, nontender, + bowel sounds GU: no suprapubic tenderness Extremities: no edema, no joint deformities Skin: no rashes Neurological: Weakness but otherwise nonfocal  Assessment and Plan- Patient is a 61 y.o. female    Encounter Diagnoses  Name Primary?   Acute cough Yes   Diarrhea, unspecified type    Acute non-recurrent maxillary sinusitis    Neutropenia, unspecified type (HCC)    Cough: New. Chest x ray normal. Likely viral in nature. She can use mucinex OTC as needed. Declined tessalon today.   Sinusitis: New. Likely started as viral now bacterial. Treating with Levaquin given her moderate neutropenia and symptoms. Discussed with patient including risks and common side effects.   Diarrhea/GERD: Likely secondary to the [facility] however given her immunocompromised status I have suggested stool studied. Continue Imodium PRN.  Electrolytes to be replaced PRN. Changing her  protonix to nexium to see if this given better efficacy.   Hypomagnesium: New. Treating with 2g magnesium today.   Moderate Neutropenia: New. Grade 3. Likely secondary to chemotherapy. Chest x ray does not show evidence of pneumonia. Labs show moderate neutropenia. Vitals are reassuring and she is afebrile. Plan will be to treat her with Levaquin.    Disposition 1L IVF today, 2g mag Starting Levaquin. Switching from Protonix to Nexium. Diflucan for after ABX course per pt request due to history of vaginal candidiasis following ABX use  RTC 1 day [facility], labs(CBC w/, CMP, mag), +- fluids/mag/k   Patient expressed understanding and was in agreement with this plan. She also understands that She can call clinic at any time with any questions, concerns, or complaints.   Thank you for allowing me to participate in the care of this very pleasant patient.   Time Total: 25  Visit consisted of counseling and education dealing with the complex and emotionally intense issues of symptom management in the setting of serious illness.Greater than 50%  of this time was spent counseling and coordinating care related to the above assessment and plan.  Signed by: Nelwyn Salisbury, PA-C

## 2022-01-27 NOTE — Telephone Encounter (Signed)
Per Judson Roch- pt needs chest xray stat and smc apt. When I called pt to discuss her symptoms, she was already downstairs at the front desk of cancer center. She walked in requesting to be evaluated. I sent pt to get a stat chest xray and asked her to return to clinic s/p xray.

## 2022-01-28 ENCOUNTER — Inpatient Hospital Stay: Payer: Medicare HMO

## 2022-01-28 ENCOUNTER — Encounter: Payer: Self-pay | Admitting: Medical Oncology

## 2022-01-28 ENCOUNTER — Inpatient Hospital Stay (HOSPITAL_BASED_OUTPATIENT_CLINIC_OR_DEPARTMENT_OTHER): Payer: Medicare HMO | Admitting: Medical Oncology

## 2022-01-28 ENCOUNTER — Ambulatory Visit: Payer: Medicare HMO | Admitting: Gastroenterology

## 2022-01-28 VITALS — BP 106/64 | HR 88 | Temp 97.6°F | Resp 18

## 2022-01-28 DIAGNOSIS — C50011 Malignant neoplasm of nipple and areola, right female breast: Secondary | ICD-10-CM

## 2022-01-28 DIAGNOSIS — K219 Gastro-esophageal reflux disease without esophagitis: Secondary | ICD-10-CM

## 2022-01-28 DIAGNOSIS — J0100 Acute maxillary sinusitis, unspecified: Secondary | ICD-10-CM

## 2022-01-28 DIAGNOSIS — R197 Diarrhea, unspecified: Secondary | ICD-10-CM | POA: Diagnosis not present

## 2022-01-28 DIAGNOSIS — E860 Dehydration: Secondary | ICD-10-CM

## 2022-01-28 DIAGNOSIS — R051 Acute cough: Secondary | ICD-10-CM | POA: Diagnosis not present

## 2022-01-28 DIAGNOSIS — Z5112 Encounter for antineoplastic immunotherapy: Secondary | ICD-10-CM | POA: Diagnosis not present

## 2022-01-28 DIAGNOSIS — T451X5A Adverse effect of antineoplastic and immunosuppressive drugs, initial encounter: Secondary | ICD-10-CM

## 2022-01-28 DIAGNOSIS — E876 Hypokalemia: Secondary | ICD-10-CM

## 2022-01-28 DIAGNOSIS — D709 Neutropenia, unspecified: Secondary | ICD-10-CM

## 2022-01-28 LAB — CBC WITH DIFFERENTIAL/PLATELET
Abs Immature Granulocytes: 0.01 10*3/uL (ref 0.00–0.07)
Basophils Absolute: 0 10*3/uL (ref 0.0–0.1)
Basophils Relative: 1 %
Eosinophils Absolute: 0 10*3/uL (ref 0.0–0.5)
Eosinophils Relative: 0 %
HCT: 29.3 % — ABNORMAL LOW (ref 36.0–46.0)
Hemoglobin: 9.6 g/dL — ABNORMAL LOW (ref 12.0–15.0)
Immature Granulocytes: 0 %
Lymphocytes Relative: 54 %
Lymphs Abs: 1.3 10*3/uL (ref 0.7–4.0)
MCH: 28.6 pg (ref 26.0–34.0)
MCHC: 32.8 g/dL (ref 30.0–36.0)
MCV: 87.2 fL (ref 80.0–100.0)
Monocytes Absolute: 0.5 10*3/uL (ref 0.1–1.0)
Monocytes Relative: 21 %
Neutro Abs: 0.6 10*3/uL — ABNORMAL LOW (ref 1.7–7.7)
Neutrophils Relative %: 24 %
Platelets: 158 10*3/uL (ref 150–400)
RBC: 3.36 MIL/uL — ABNORMAL LOW (ref 3.87–5.11)
RDW: 14.5 % (ref 11.5–15.5)
Smear Review: NORMAL
WBC: 2.4 10*3/uL — ABNORMAL LOW (ref 4.0–10.5)
nRBC: 0 % (ref 0.0–0.2)

## 2022-01-28 LAB — COMPREHENSIVE METABOLIC PANEL
ALT: 19 U/L (ref 0–44)
AST: 22 U/L (ref 15–41)
Albumin: 4.1 g/dL (ref 3.5–5.0)
Alkaline Phosphatase: 94 U/L (ref 38–126)
Anion gap: 8 (ref 5–15)
BUN: 15 mg/dL (ref 6–20)
CO2: 26 mmol/L (ref 22–32)
Calcium: 8.9 mg/dL (ref 8.9–10.3)
Chloride: 102 mmol/L (ref 98–111)
Creatinine, Ser: 0.99 mg/dL (ref 0.44–1.00)
GFR, Estimated: >60 mL/min (ref >60)
Glucose, Bld: 111 mg/dL — ABNORMAL HIGH (ref 70–99)
Potassium: 3.4 mmol/L — ABNORMAL LOW (ref 3.5–5.1)
Sodium: 136 mmol/L (ref 135–145)
Total Bilirubin: 0.6 mg/dL (ref 0.3–1.2)
Total Protein: 7.3 g/dL (ref 6.5–8.1)

## 2022-01-28 LAB — C DIFFICILE QUICK SCREEN W PCR REFLEX
C Diff antigen: NEGATIVE
C Diff interpretation: NOT DETECTED
C Diff toxin: NEGATIVE

## 2022-01-28 LAB — MAGNESIUM: Magnesium: 1.8 mg/dL (ref 1.7–2.4)

## 2022-01-28 MED ORDER — HEPARIN SOD (PORK) LOCK FLUSH 100 UNIT/ML IV SOLN
500.0000 [IU] | Freq: Once | INTRAVENOUS | Status: AC
  Administered 2022-01-28: 500 [IU] via INTRAVENOUS
  Filled 2022-01-28: qty 5

## 2022-01-28 MED ORDER — SODIUM CHLORIDE 0.9 % IV SOLN
Freq: Once | INTRAVENOUS | Status: AC
  Filled 2022-01-28: qty 250

## 2022-01-28 MED ORDER — SODIUM CHLORIDE 0.9% FLUSH
10.0000 mL | Freq: Once | INTRAVENOUS | Status: AC
  Administered 2022-01-28: 10 mL via INTRAVENOUS
  Filled 2022-01-28: qty 10

## 2022-01-28 MED ORDER — POTASSIUM CHLORIDE 20 MEQ/100ML IV SOLN
20.0000 meq | Freq: Once | INTRAVENOUS | Status: AC
  Administered 2022-01-28: 20 meq via INTRAVENOUS

## 2022-01-28 NOTE — Progress Notes (Signed)
Symptom Management [place] [facility] [phone] [phone]  Patient Care Team: [facility] as PCP - General Daiva Huge, RN as Oncology Nurse Navigator Earlie Server, MD as Consulting Physician (Oncology)   Name of the patient: Vanessa Fisher  [id number]  1961-02-01   Date of visit: 01/28/22  Reason for Consult: Vanessa Fisher is a 61 y.o. female with stage IIIB ER/PR-, HER2+ left breast cancer currently on TCHP + Udenyca s/p cycle 5 on 01/21/2022 who presents today for:   Neutropenia: Patient was seen yesterday in our [facility] clinic for symptoms of diarrhea, mild cough, sinus congestion, GERD, nausea/vomiting. She was found to have grade 3 neutropenia and was subsequently started on Levaquin and switched her Protonix to Nexium. In office she received 1L IVF along with 2 g of magnesium. Today she reports that she is feeling much better. All symptoms have significantly reduced. Stools are now infrequent and formed, vomiting and GERD symptoms have resolved. She has a better appetite and is less fatigued. No fevers, abdominal pain. She is tolerating her Levaquin well without any side effects including joint pains, palpitations.    Wt Readings from Last 3 Encounters:  01/21/22 195 lb (88.5 kg)  12/17/21 208 lb 4.8 oz (94.5 kg)  11/26/21 207 lb 11.2 oz (94.2 kg)    PAST MEDICAL HISTORY: Past Medical History:  Diagnosis Date   Anemia    Anxiety    Arthritis    Asthma    WELL CONTROLLED   Bile acid malabsorption syndrome    Bradycardia    HAD AN ISSUE WITH THIS IN 2016-NO PROBLEMS SINCE   Breast cancer, left breast ([place]) 08/2021   Carpal tunnel syndrome on right    Depression    Diabetes mellitus without complication (HCC)    Diverticulitis    Gastric reflux    GERD (gastroesophageal reflux disease)    Hand pain 05/12/2016   Headache    H/O MIGRAINES   History of kidney stones    H/O   Lumbar radiculitis     Neck pain, chronic    Osteoarthritis of both knees    Panic attacks     PAST SURGICAL HISTORY:  Past Surgical History:  Procedure Laterality Date   BREAST BIOPSY Left 08/21/2021   Axilla Bx, Hydromarker, path pending   BREAST BIOPSY Left 08/21/2021   [place] Bx, Ribbon Clip, Path Pending   CARPAL TUNNEL RELEASE Right 12/06/2017   Procedure: CARPAL TUNNEL RELEASE;  Surgeon: Earnestine Leys, MD;  Location: [facility] ORS;  Service: Orthopedics;  Laterality: Right;   COLONOSCOPY WITH PROPOFOL N/A 06/11/2021   Procedure: COLONOSCOPY WITH PROPOFOL;  Surgeon: Lin Landsman, MD;  Location: [facility] ENDOSCOPY;  Service: Gastroenterology;  Laterality: N/A;   DORSAL COMPARTMENT RELEASE Right 12/06/2017   Procedure: RELEASE DORSAL COMPARTMENT (DEQUERVAIN);  Surgeon: Earnestine Leys, MD;  Location: [facility] ORS;  Service: Orthopedics;  Laterality: Right;   ESOPHAGOGASTRODUODENOSCOPY (EGD) WITH PROPOFOL N/A 06/11/2021   Procedure: ESOPHAGOGASTRODUODENOSCOPY (EGD) WITH PROPOFOL;  Surgeon: Lin Landsman, MD;  Location: [facility] ENDOSCOPY;  Service: Gastroenterology;  Laterality: N/A;   PARTIAL HYSTERECTOMY     PORTACATH PLACEMENT N/A 09/24/2021   Procedure: INSERTION PORT-A-CATH;  Surgeon: Herbert Pun, MD;  Location: [facility] ORS;  Service: General;  Laterality: N/A;   TUBAL LIGATION      HEMATOLOGY/ONCOLOGY HISTORY:  Oncology History  Breast cancer ([place])  07/31/2021 Imaging   Bilateral diagnostic mammogram and US showed 5 centimeter LEFT breast mass associated  with pleomorphic calcifications is suspicious for invasive ductal carcinoma.At least 4 LEFT axillary lymph nodes with abnormal morphology   08/21/2021 Cancer Staging   Staging form: Breast, AJCC 8th Edition - Clinical: Stage IIIB (cT4b, cN3c, cM0, G3, ER-, PR-, HER2+) - Signed by Earlie Server, MD on 08/28/2021 Histologic grading system: 3 grade system  -08/21/21 left breast ultrasound-guided biopsy showed invasive mammary carcinoma, grade 3, ER/PR  negative, HER2 positive.  Left axillary lymph node biopsy positive for macro metastatic mammary carcinoma, 8 mm in greatest extent.   08/30/2021 Imaging   MRI brain w wo contrast -No metastatic disease or acute intracranial abnormality. Essentially normal for age MRI appearance of the brain.    09/03/2021 Echocardiogram   1. Left ventricular ejection fraction, by estimation, is 55 to 60%. Left ventricular ejection fraction by 3D volume is 58 %. The left ventricle has normal function. The left ventricle has no regional wall motion abnormalities. There is mild left  ventricular hypertrophy. Left ventricular diastolic parameters were normal.  2. Right ventricular systolic function is normal. The right ventricular size is normal.  3. The mitral valve is normal in structure. No evidence of mitral valve regurgitation.  4. The aortic valve was not well visualized. Aortic valve regurgitation is not visualized   09/10/2021 Imaging   PET scan showed Large hypermetabolic left breast mass and diffuse skin thickening,consistent with primary breast carcinoma. Hypermetabolic lymphadenopathy in the left axilla, left subpectoral region, and left supraclavicular region, consistent with metastatic disease. No evidence of metastatic disease within the abdomen or pelvis   09/12/2021 -  Chemotherapy   Patient is on Treatment Plan : BREAST  Docetaxel + Carboplatin + Trastuzumab + Pertuzumab  (TCHP) q21d       Genetic Testing   Negative genetic testing. No pathogenic variants identified on the [facility] Common Hereditary Cancers+RNA panel. The report date is 10/26/2021.  The Common Hereditary Cancers Panel + RNA offered by [facility] includes sequencing and/or deletion duplication testing of the following 47 genes: APC, ATM, AXIN2, BARD1, BMPR1A, BRCA1, BRCA2, BRIP1, CDH1, CDKN2A (p14ARF), CDKN2A (p16INK4a), CKD4, CHEK2, CTNNA1, DICER1, EPCAM (Deletion/duplication testing only), GREM1 (promoter region deletion/duplication  testing only), KIT, MEN1, MLH1, MSH2, MSH3, MSH6, MUTYH, NBN, NF1, NHTL1, PALB2, PDGFRA, PMS2, POLD1, POLE, PTEN, RAD50, RAD51C, RAD51D, SDHB, SDHC, SDHD, SMAD4, SMARCA4. STK11, TP53, TSC1, TSC2, and VHL.  The following genes were evaluated for sequence changes only: SDHA and HOXB13 c.251G>A variant only.     ALLERGIES:  is allergic to bc powder [aspirin-salicylamide-caffeine], gabapentin, hydrocodone-acetaminophen, latex, penicillin g, penicillins, and shellfish allergy.  MEDICATIONS:  Current Outpatient Medications  Medication Sig Dispense Refill   albuterol (VENTOLIN HFA) 108 (90 Base) MCG/ACT inhaler Inhale 2 puffs into the lungs every 6 (six) hours as needed for wheezing or shortness of breath. 18 g 0   cetirizine (ZYRTEC ALLERGY) 10 MG tablet Take 1 tablet (10 mg total) by mouth daily. 90 tablet 0   diphenoxylate-atropine (LOMOTIL) 2.5-0.025 MG tablet Take 1 tablet by mouth 4 (four) times daily. 60 tablet 1   doxycycline (MONODOX) 100 MG capsule Take 100 mg by mouth 2 (two) times daily.     esomeprazole (NEXIUM) 40 MG capsule Take 1 capsule (40 mg total) by mouth daily at 12 noon. 30 capsule 3   FLOVENT DISKUS 250 MCG/ACT AEPB Inhale 1 puff into the lungs daily.     fluconazole (DIFLUCAN) 150 MG tablet Take 1 tablet (150 mg total) by mouth daily. Take after you have finished your antibiotics 1 tablet  0   fluticasone (FLONASE) 50 MCG/ACT nasal spray Place 2 sprays into both nostrils daily. 15.8 mL 0   levofloxacin (LEVAQUIN) 750 MG tablet Take 1 tablet (750 mg total) by mouth daily for 5 days. 5 tablet 0   lidocaine-prilocaine (EMLA) cream Apply 1 Application topically as needed. 30 g 12   lisinopril (ZESTRIL) 10 MG tablet Take 10 mg by mouth daily.     loperamide (IMODIUM) 2 MG capsule Take 1 capsule (2 mg total) by mouth See admin instructions. Initial: 4 mg,the 2 mg every 2 hours (4 mg every 4 hours at night)  maximum: 16 mg/day 90 capsule 1   magnesium chloride (SLOW-MAG) 64 MG TBEC SR  tablet Take 1 tablet (64 mg total) by mouth daily. 30 tablet 3   montelukast (SINGULAIR) 10 MG tablet Take by mouth.     ondansetron (ZOFRAN) 8 MG tablet Take 1 tablet (8 mg total) by mouth 2 (two) times daily as needed (Nausea or vomiting). Start on the third day after chemotherapy. 30 tablet 1   oxyCODONE-acetaminophen (PERCOCET/ROXICET) 5-325 MG tablet Take 1 tablet by mouth every 6 (six) hours as needed for pain.     potassium chloride SA (KLOR-CON M) 20 MEQ tablet Take 1 tablet (20 mEq total) by mouth 2 (two) times daily. 60 tablet 1   prochlorperazine (COMPAZINE) 10 MG tablet Take 1 tablet (10 mg total) by mouth every 6 (six) hours as needed (Nausea or vomiting). 30 tablet 1   zinc gluconate 50 MG tablet Take 50 mg by mouth daily.     albuterol (PROVENTIL) (2.5 MG/3ML) 0.083% nebulizer solution Take 3 mLs (2.5 mg total) by nebulization every 4 (four) hours as needed for wheezing or shortness of breath. (Patient not taking: Reported on 01/27/2022) 75 mL 2   dexamethasone (DECADRON) 4 MG tablet Take 2 tablets (8 mg total) by mouth daily. Start the day before Taxotere. Then take daily x 2 days after chemotherapy. (Patient not taking: Reported on 01/27/2022) 30 tablet 1   lisinopril-hydrochlorothiazide (ZESTORETIC) 10-12.5 MG tablet Take 1 tablet by mouth daily. (Patient not taking: Reported on 01/27/2022)     magic mouthwash (multi-ingredient) oral suspension Swish and spit 5-10 ml by  mouth 4 times a day as needed (Patient not taking: Reported on 01/27/2022) 400 mL 1   naloxone (NARCAN) nasal spray 4 mg/0.1 mL  (Patient not taking: Reported on 12/17/2021)     No current facility-administered medications for this visit.    VITAL SIGNS: BP 106/64 (BP Location: Right Arm, Patient Position: Sitting)   Pulse 88   Temp 97.6 F (36.4 C) (Tympanic)   Resp 18   SpO2 100%  There were no vitals filed for this visit.  Estimated body mass index is 31.47 kg/m as calculated from the following:    Height as of 11/10/21: _0  (1.676 m).   Weight as of 01/21/22: 195 lb (88.5 kg).  LABS: CBC:    Component Value Date/Time   WBC 2.4 (L) 01/28/2022 0956   HGB 9.6 (L) 01/28/2022 0956   HGB 11.9 (L) 05/27/2014 2215   HCT 29.3 (L) 01/28/2022 0956   HCT 36.4 05/27/2014 2215   PLT 158 01/28/2022 0956   PLT 293 05/27/2014 2215   MCV 87.2 01/28/2022 0956   MCV 81 05/27/2014 2215   NEUTROABS 0.6 (L) 01/28/2022 0956   NEUTROABS 5.4 05/27/2014 2215   LYMPHSABS 1.3 01/28/2022 0956   LYMPHSABS 3.0 05/27/2014 2215   MONOABS 0.5 01/28/2022 0956   MONOABS  0.6 05/27/2014 2215   EOSABS 0.0 01/28/2022 0956   EOSABS 0.0 05/27/2014 2215   BASOSABS 0.0 01/28/2022 0956   BASOSABS 0.0 05/27/2014 2215   Comprehensive Metabolic Panel:    Component Value Date/Time   NA 136 01/28/2022 0956   NA 143 07/08/2016 1259   NA 139 05/27/2014 2215   K 3.4 (L) 01/28/2022 0956   K 3.5 05/27/2014 2215   CL 102 01/28/2022 0956   CL 105 05/27/2014 2215   CO2 26 01/28/2022 0956   CO2 26 05/27/2014 2215   BUN 15 01/28/2022 0956   BUN 10 07/08/2016 1259   BUN 14 05/27/2014 2215   CREATININE 0.99 01/28/2022 0956   CREATININE 1.09 (H) 05/27/2014 2215   GLUCOSE 111 (H) 01/28/2022 0956   GLUCOSE 104 (H) 05/27/2014 2215   CALCIUM 8.9 01/28/2022 0956   CALCIUM 9.0 05/27/2014 2215   AST 22 01/28/2022 0956   AST < 5 (L) 08/05/2013 0832   ALT 19 01/28/2022 0956   ALT 24 08/05/2013 0832   ALKPHOS 94 01/28/2022 0956   ALKPHOS 97 08/05/2013 0832   BILITOT 0.6 01/28/2022 0956   BILITOT 0.4 07/08/2016 1259   BILITOT 0.4 08/05/2013 0832   PROT 7.3 01/28/2022 0956   PROT 7.0 07/08/2016 1259   PROT 6.8 08/05/2013 0832   ALBUMIN 4.1 01/28/2022 0956   ALBUMIN 4.0 07/08/2016 1259   ALBUMIN 3.4 08/05/2013 0832    RADIOGRAPHIC STUDIES: DG Chest 2 View  Result Date: 01/27/2022 CLINICAL DATA:  Cough, congestion EXAM: CHEST - 2 VIEW COMPARISON:  09/24/2021 FINDINGS: Right-sided Port-A-Cath with the tip projecting  over the SVC. No focal consolidation. No pleural effusion or pneumothorax. Heart and mediastinal contours are unremarkable. No acute osseous abnormality. IMPRESSION: No active cardiopulmonary disease. Electronically Signed   By: Kathreen Devoid M.D.   On: 01/27/2022 10:32    PERFORMANCE STATUS (ECOG) : 1 - Symptomatic but completely ambulatory  Review of Systems Unless otherwise noted, a complete review of systems is negative.  Physical Exam General: NAD. Appears brighter today  HEENT: Maxillary bogginess of the right sinus with mild to moderate tenderness of sinuses throughout. Nasal passages with erythema and scant yellow/green discharge  Cardiovascular: regular rate and rhythm Pulmonary: clear ant fields without wheeze or rhonchi Abdomen: soft, nontender, + bowel sounds GU: no suprapubic tenderness Extremities: no edema, no joint deformities Skin: no rashes Neurological: Weakness but otherwise nonfocal  Assessment and Plan- Patient is a 61 y.o. female  Encounter Diagnoses  Name Primary?   Diarrhea, unspecified type Yes   Dehydration    Hypomagnesemia    Acute cough    Acute non-recurrent maxillary sinusitis    Neutropenia, unspecified type (HCC)    Gastroesophageal reflux disease, unspecified whether esophagitis present    Hypokalemia     Today she is feeling much better with all symptoms improving on the Levaquin. Labs improving. She would benefit from 1L IVF today along with 20 MEQ K to help with her mild hypokalemia. Continue good PO intake. Here tomorrow for [facility] recheck and labs.    Disposition 1L IVF today, 20 MEQ K RTC tomorrow [facility], labs, +- fluids=-K/mag   Patient expressed understanding and was in agreement with this plan. She also understands that She can call clinic at any time with any questions, concerns, or complaints.   Thank you for allowing me to participate in the care of this very pleasant patient.   Time Total: 25  Visit consisted of counseling and  education dealing with  the complex and emotionally intense issues of symptom management in the setting of serious illness.Greater than 50%  of this time was spent counseling and coordinating care related to the above assessment and plan.  Signed by: Nelwyn Salisbury, PA-C

## 2022-01-29 ENCOUNTER — Inpatient Hospital Stay: Payer: Medicare HMO

## 2022-01-29 VITALS — BP 103/66 | HR 76 | Temp 97.0°F | Resp 20

## 2022-01-29 DIAGNOSIS — T451X5A Adverse effect of antineoplastic and immunosuppressive drugs, initial encounter: Secondary | ICD-10-CM

## 2022-01-29 DIAGNOSIS — C50011 Malignant neoplasm of nipple and areola, right female breast: Secondary | ICD-10-CM

## 2022-01-29 DIAGNOSIS — Z5112 Encounter for antineoplastic immunotherapy: Secondary | ICD-10-CM | POA: Diagnosis not present

## 2022-01-29 LAB — BASIC METABOLIC PANEL
Anion gap: 8 (ref 5–15)
BUN: 11 mg/dL (ref 6–20)
CO2: 24 mmol/L (ref 22–32)
Calcium: 8.9 mg/dL (ref 8.9–10.3)
Chloride: 107 mmol/L (ref 98–111)
Creatinine, Ser: 0.96 mg/dL (ref 0.44–1.00)
GFR, Estimated: >60 mL/min (ref >60)
Glucose, Bld: 107 mg/dL — ABNORMAL HIGH (ref 70–99)
Potassium: 3.7 mmol/L (ref 3.5–5.1)
Sodium: 139 mmol/L (ref 135–145)

## 2022-01-29 LAB — GI PATHOGEN PANEL BY PCR, STOOL

## 2022-01-29 LAB — MAGNESIUM: Magnesium: 1.5 mg/dL — ABNORMAL LOW (ref 1.7–2.4)

## 2022-01-29 MED ORDER — PEGFILGRASTIM-CBQV 6 MG/0.6ML ~~LOC~~ SOSY: 6.0000 mg | PREFILLED_SYRINGE | Freq: Once | SUBCUTANEOUS | Status: DC

## 2022-01-29 MED ORDER — SODIUM CHLORIDE 0.9% FLUSH
10.0000 mL | Freq: Once | INTRAVENOUS | Status: AC
  Administered 2022-01-29: 10 mL via INTRAVENOUS
  Filled 2022-01-29: qty 10

## 2022-01-29 MED ORDER — MAGNESIUM SULFATE 2 GM/50ML IV SOLN
2.0000 g | Freq: Once | INTRAVENOUS | Status: AC
  Administered 2022-01-29: 2 g via INTRAVENOUS
  Filled 2022-01-29: qty 50

## 2022-01-29 MED ORDER — HEPARIN SOD (PORK) LOCK FLUSH 100 UNIT/ML IV SOLN
500.0000 [IU] | Freq: Once | INTRAVENOUS | Status: AC
  Administered 2022-01-29: 500 [IU] via INTRAVENOUS
  Filled 2022-01-29: qty 5

## 2022-01-29 MED ORDER — SODIUM CHLORIDE 0.9 % IV SOLN
Freq: Once | INTRAVENOUS | Status: AC
  Filled 2022-01-29: qty 250

## 2022-02-01 LAB — STOOL CULTURE: E coli, Shiga toxin Assay: NEGATIVE

## 2022-02-01 LAB — STOOL CULTURE REFLEX - CMPCXR

## 2022-02-01 LAB — STOOL CULTURE REFLEX - RSASHR

## 2022-02-05 ENCOUNTER — Inpatient Hospital Stay: Payer: Medicare HMO | Attending: Oncology

## 2022-02-05 ENCOUNTER — Inpatient Hospital Stay: Payer: Medicare HMO

## 2022-02-05 VITALS — BP 101/64 | HR 73 | Temp 98.6°F | Resp 20

## 2022-02-05 DIAGNOSIS — E860 Dehydration: Secondary | ICD-10-CM | POA: Insufficient documentation

## 2022-02-05 DIAGNOSIS — G4700 Insomnia, unspecified: Secondary | ICD-10-CM | POA: Diagnosis not present

## 2022-02-05 DIAGNOSIS — E876 Hypokalemia: Secondary | ICD-10-CM

## 2022-02-05 DIAGNOSIS — Z5112 Encounter for antineoplastic immunotherapy: Secondary | ICD-10-CM | POA: Diagnosis present

## 2022-02-05 DIAGNOSIS — Z171 Estrogen receptor negative status [ER-]: Secondary | ICD-10-CM | POA: Diagnosis not present

## 2022-02-05 DIAGNOSIS — L271 Localized skin eruption due to drugs and medicaments taken internally: Secondary | ICD-10-CM | POA: Diagnosis not present

## 2022-02-05 DIAGNOSIS — D696 Thrombocytopenia, unspecified: Secondary | ICD-10-CM | POA: Insufficient documentation

## 2022-02-05 DIAGNOSIS — Z79899 Other long term (current) drug therapy: Secondary | ICD-10-CM | POA: Diagnosis not present

## 2022-02-05 DIAGNOSIS — C50011 Malignant neoplasm of nipple and areola, right female breast: Secondary | ICD-10-CM

## 2022-02-05 DIAGNOSIS — Z5189 Encounter for other specified aftercare: Secondary | ICD-10-CM | POA: Diagnosis not present

## 2022-02-05 DIAGNOSIS — K521 Toxic gastroenteritis and colitis: Secondary | ICD-10-CM | POA: Insufficient documentation

## 2022-02-05 DIAGNOSIS — D6481 Anemia due to antineoplastic chemotherapy: Secondary | ICD-10-CM | POA: Diagnosis not present

## 2022-02-05 DIAGNOSIS — Z452 Encounter for adjustment and management of vascular access device: Secondary | ICD-10-CM | POA: Diagnosis not present

## 2022-02-05 DIAGNOSIS — J329 Chronic sinusitis, unspecified: Secondary | ICD-10-CM | POA: Diagnosis not present

## 2022-02-05 DIAGNOSIS — C50812 Malignant neoplasm of overlapping sites of left female breast: Secondary | ICD-10-CM | POA: Diagnosis present

## 2022-02-05 DIAGNOSIS — F419 Anxiety disorder, unspecified: Secondary | ICD-10-CM | POA: Diagnosis not present

## 2022-02-05 DIAGNOSIS — T451X5A Adverse effect of antineoplastic and immunosuppressive drugs, initial encounter: Secondary | ICD-10-CM

## 2022-02-05 DIAGNOSIS — Z5111 Encounter for antineoplastic chemotherapy: Secondary | ICD-10-CM | POA: Diagnosis present

## 2022-02-05 LAB — MAGNESIUM: Magnesium: 0.9 mg/dL — CL (ref 1.7–2.4)

## 2022-02-05 LAB — BASIC METABOLIC PANEL
Anion gap: 8 (ref 5–15)
BUN: 11 mg/dL (ref 6–20)
CO2: 23 mmol/L (ref 22–32)
Calcium: 6.9 mg/dL — ABNORMAL LOW (ref 8.9–10.3)
Chloride: 109 mmol/L (ref 98–111)
Creatinine, Ser: 0.86 mg/dL (ref 0.44–1.00)
GFR, Estimated: >60 mL/min (ref >60)
Glucose, Bld: 102 mg/dL — ABNORMAL HIGH (ref 70–99)
Potassium: 3.1 mmol/L — ABNORMAL LOW (ref 3.5–5.1)
Sodium: 140 mmol/L (ref 135–145)

## 2022-02-05 MED ORDER — SODIUM CHLORIDE 0.9% FLUSH
10.0000 mL | Freq: Once | INTRAVENOUS | Status: AC
  Administered 2022-02-05: 10 mL via INTRAVENOUS
  Filled 2022-02-05: qty 10

## 2022-02-05 MED ORDER — POTASSIUM CHLORIDE 20 MEQ/100ML IV SOLN
20.0000 meq | Freq: Once | INTRAVENOUS | Status: AC
  Administered 2022-02-05: 20 meq via INTRAVENOUS

## 2022-02-05 MED ORDER — SODIUM CHLORIDE 0.9 % IV SOLN: 6.0000 g | Freq: Once | INTRAVENOUS | Status: DC

## 2022-02-05 MED ORDER — SODIUM CHLORIDE 0.9 % IV SOLN
Freq: Once | INTRAVENOUS | Status: AC
  Filled 2022-02-05: qty 250

## 2022-02-05 MED ORDER — MAGNESIUM SULFATE 4 GM/100ML IV SOLN
4.0000 g | Freq: Once | INTRAVENOUS | Status: AC
  Administered 2022-02-05: 4 g via INTRAVENOUS
  Filled 2022-02-05: qty 100

## 2022-02-05 MED ORDER — HEPARIN SOD (PORK) LOCK FLUSH 100 UNIT/ML IV SOLN
500.0000 [IU] | Freq: Once | INTRAVENOUS | Status: AC
  Administered 2022-02-05: 500 [IU] via INTRAVENOUS
  Filled 2022-02-05: qty 5

## 2022-02-05 MED ORDER — MAGNESIUM SULFATE 2 GM/50ML IV SOLN
2.0000 g | Freq: Once | INTRAVENOUS | Status: AC
  Administered 2022-02-05: 2 g via INTRAVENOUS
  Filled 2022-02-05: qty 50

## 2022-02-06 ENCOUNTER — Encounter: Payer: Self-pay | Admitting: Medical Oncology

## 2022-02-06 ENCOUNTER — Encounter: Payer: Self-pay | Admitting: Oncology

## 2022-02-06 ENCOUNTER — Ambulatory Visit: Payer: Medicare HMO | Admitting: Medical Oncology

## 2022-02-06 ENCOUNTER — Other Ambulatory Visit: Payer: Medicare HMO

## 2022-02-06 ENCOUNTER — Inpatient Hospital Stay (HOSPITAL_BASED_OUTPATIENT_CLINIC_OR_DEPARTMENT_OTHER): Payer: Medicare HMO | Admitting: Medical Oncology

## 2022-02-06 ENCOUNTER — Ambulatory Visit: Payer: Medicare HMO

## 2022-02-06 ENCOUNTER — Other Ambulatory Visit: Payer: Self-pay

## 2022-02-06 ENCOUNTER — Inpatient Hospital Stay: Payer: Medicare HMO

## 2022-02-06 VITALS — BP 102/62 | Temp 97.9°F | Resp 20 | Ht 66.0 in | Wt 191.5 lb

## 2022-02-06 DIAGNOSIS — C50011 Malignant neoplasm of nipple and areola, right female breast: Secondary | ICD-10-CM | POA: Diagnosis not present

## 2022-02-06 DIAGNOSIS — T451X5A Adverse effect of antineoplastic and immunosuppressive drugs, initial encounter: Secondary | ICD-10-CM

## 2022-02-06 DIAGNOSIS — D6481 Anemia due to antineoplastic chemotherapy: Secondary | ICD-10-CM

## 2022-02-06 DIAGNOSIS — Z5112 Encounter for antineoplastic immunotherapy: Secondary | ICD-10-CM | POA: Diagnosis not present

## 2022-02-06 DIAGNOSIS — E860 Dehydration: Secondary | ICD-10-CM

## 2022-02-06 DIAGNOSIS — K521 Toxic gastroenteritis and colitis: Secondary | ICD-10-CM

## 2022-02-06 DIAGNOSIS — D696 Thrombocytopenia, unspecified: Secondary | ICD-10-CM

## 2022-02-06 DIAGNOSIS — E876 Hypokalemia: Secondary | ICD-10-CM

## 2022-02-06 DIAGNOSIS — D709 Neutropenia, unspecified: Secondary | ICD-10-CM

## 2022-02-06 DIAGNOSIS — Z95828 Presence of other vascular implants and grafts: Secondary | ICD-10-CM

## 2022-02-06 LAB — CBC WITH DIFFERENTIAL/PLATELET
Abs Immature Granulocytes: 0.08 10*3/uL — ABNORMAL HIGH (ref 0.00–0.07)
Basophils Absolute: 0 10*3/uL (ref 0.0–0.1)
Basophils Relative: 0 %
Eosinophils Absolute: 0 10*3/uL (ref 0.0–0.5)
Eosinophils Relative: 0 %
HCT: 25.4 % — ABNORMAL LOW (ref 36.0–46.0)
Hemoglobin: 8.4 g/dL — ABNORMAL LOW (ref 12.0–15.0)
Immature Granulocytes: 1 %
Lymphocytes Relative: 39 %
Lymphs Abs: 2.2 10*3/uL (ref 0.7–4.0)
MCH: 28.9 pg (ref 26.0–34.0)
MCHC: 33.1 g/dL (ref 30.0–36.0)
MCV: 87.3 fL (ref 80.0–100.0)
Monocytes Absolute: 0.5 10*3/uL (ref 0.1–1.0)
Monocytes Relative: 9 %
Neutro Abs: 2.9 10*3/uL (ref 1.7–7.7)
Neutrophils Relative %: 51 %
Platelets: 95 10*3/uL — ABNORMAL LOW (ref 150–400)
RBC: 2.91 MIL/uL — ABNORMAL LOW (ref 3.87–5.11)
RDW: 15.6 % — ABNORMAL HIGH (ref 11.5–15.5)
WBC: 5.7 10*3/uL (ref 4.0–10.5)
nRBC: 0 % (ref 0.0–0.2)

## 2022-02-06 LAB — BASIC METABOLIC PANEL
Anion gap: 10 (ref 5–15)
BUN: 9 mg/dL (ref 6–20)
CO2: 25 mmol/L (ref 22–32)
Calcium: 7.8 mg/dL — ABNORMAL LOW (ref 8.9–10.3)
Chloride: 106 mmol/L (ref 98–111)
Creatinine, Ser: 0.78 mg/dL (ref 0.44–1.00)
GFR, Estimated: >60 mL/min (ref >60)
Glucose, Bld: 111 mg/dL — ABNORMAL HIGH (ref 70–99)
Potassium: 3.3 mmol/L — ABNORMAL LOW (ref 3.5–5.1)
Sodium: 141 mmol/L (ref 135–145)

## 2022-02-06 LAB — MAGNESIUM: Magnesium: 2.1 mg/dL (ref 1.7–2.4)

## 2022-02-06 MED ORDER — HEPARIN SOD (PORK) LOCK FLUSH 100 UNIT/ML IV SOLN
500.0000 [IU] | Freq: Once | INTRAVENOUS | Status: AC | PRN
  Administered 2022-02-06: 500 [IU]
  Filled 2022-02-06: qty 5

## 2022-02-06 MED ORDER — SODIUM CHLORIDE 0.9 % IV SOLN
Freq: Once | INTRAVENOUS | Status: AC
  Filled 2022-02-06: qty 250

## 2022-02-06 MED ORDER — MAGNESIUM SULFATE 2 GM/50ML IV SOLN: 2.0000 g | Freq: Once | INTRAVENOUS | Status: DC

## 2022-02-06 MED ORDER — SODIUM CHLORIDE 0.9 % IV SOLN: 6.0000 g | Freq: Once | INTRAVENOUS | Status: DC

## 2022-02-06 MED ORDER — SODIUM CHLORIDE 0.9% FLUSH
10.0000 mL | Freq: Once | INTRAVENOUS | Status: AC
  Administered 2022-02-06: 10 mL via INTRAVENOUS
  Filled 2022-02-06: qty 10

## 2022-02-06 MED ORDER — MAGNESIUM SULFATE 4 GM/100ML IV SOLN: 4.0000 g | Freq: Once | INTRAVENOUS | Status: DC

## 2022-02-06 MED ORDER — POTASSIUM CHLORIDE 20 MEQ/100ML IV SOLN
20.0000 meq | Freq: Once | INTRAVENOUS | Status: AC
  Administered 2022-02-06: 20 meq via INTRAVENOUS

## 2022-02-06 MED ORDER — STERILE WATER FOR INJECTION IJ SOLN
OROMUCOSAL | 1 refills | Status: DC
  Filled 2022-02-06: qty 400, 10d supply, fill #0

## 2022-02-06 NOTE — Progress Notes (Signed)
Symptom Management [place] at [facility] Telephone:([phone]) [phone] [phone]) [phone]  Patient Care Team: [place] as PCP - General Daiva Huge, RN as Oncology Nurse Navigator Earlie Server, MD as Consulting Physician (Oncology)   Name of the patient: Vanessa Fisher  [id number]  1960/09/10   Date of visit: 02/06/22  Reason for Consult: Vanessa Fisher is a 62 y.o. female with stage IIIB ER/PR-, HER2+ left breast cancer currently on TCHP + Udenyca s/p cycle 5 on 01/21/2022 who presents today for:   Diarrhea: having diarrhea thought to be due to Levaquin which she was on for neutropenia. Has had stool cultures/studies and was not found to have C. Diff or other infectious process. Today she reports that she feels so much better after getting IVF, fluids, magnesium and potassium yesterday. Has a critically low magnesium level. She reports continued diarrhea and mild nausea but overall feeling significantly better. No bleeding.    Wt Readings from Last 3 Encounters:  02/06/22 191 lb 8 oz (86.9 kg)  01/21/22 195 lb (88.5 kg)  12/17/21 208 lb 4.8 oz (94.5 kg)    PAST MEDICAL HISTORY: Past Medical History:  Diagnosis Date   Anemia    Anxiety    Arthritis    Asthma    WELL CONTROLLED   Bile acid malabsorption syndrome    Bradycardia    HAD AN ISSUE WITH THIS IN 2016-NO PROBLEMS SINCE   Breast cancer, left breast ([place]) 08/2021   Carpal tunnel syndrome on right    Depression    Diabetes mellitus without complication (HCC)    Diverticulitis    Gastric reflux    GERD (gastroesophageal reflux disease)    Hand pain 05/12/2016   Headache    H/O MIGRAINES   History of kidney stones    H/O   Lumbar radiculitis    Neck pain, chronic    Osteoarthritis of both knees    Panic attacks     PAST SURGICAL HISTORY:  Past Surgical History:  Procedure Laterality Date   BREAST BIOPSY Left 08/21/2021   Axilla Bx, Hydromarker, path pending    BREAST BIOPSY Left 08/21/2021   [place] Bx, Ribbon Clip, Path Pending   CARPAL TUNNEL RELEASE Right 12/06/2017   Procedure: CARPAL TUNNEL RELEASE;  Surgeon: Earnestine Leys, MD;  Location: [facility] ORS;  Service: Orthopedics;  Laterality: Right;   COLONOSCOPY WITH PROPOFOL N/A 06/11/2021   Procedure: COLONOSCOPY WITH PROPOFOL;  Surgeon: Lin Landsman, MD;  Location: [facility] ENDOSCOPY;  Service: Gastroenterology;  Laterality: N/A;   DORSAL COMPARTMENT RELEASE Right 12/06/2017   Procedure: RELEASE DORSAL COMPARTMENT (DEQUERVAIN);  Surgeon: Earnestine Leys, MD;  Location: [facility] ORS;  Service: Orthopedics;  Laterality: Right;   ESOPHAGOGASTRODUODENOSCOPY (EGD) WITH PROPOFOL N/A 06/11/2021   Procedure: ESOPHAGOGASTRODUODENOSCOPY (EGD) WITH PROPOFOL;  Surgeon: Lin Landsman, MD;  Location: [facility] ENDOSCOPY;  Service: Gastroenterology;  Laterality: N/A;   PARTIAL HYSTERECTOMY     PORTACATH PLACEMENT N/A 09/24/2021   Procedure: INSERTION PORT-A-CATH;  Surgeon: Herbert Pun, MD;  Location: [facility] ORS;  Service: General;  Laterality: N/A;   TUBAL LIGATION      HEMATOLOGY/ONCOLOGY HISTORY:  Oncology History  Breast cancer ([place])  07/31/2021 Imaging   Bilateral diagnostic mammogram and US showed 5 centimeter LEFT breast mass associated with pleomorphic calcifications is suspicious for invasive ductal carcinoma.At least 4 LEFT axillary lymph nodes with abnormal morphology   08/21/2021 Cancer Staging   Staging form: Breast, AJCC 8th Edition - Clinical: Stage IIIB (cT4b,  cN3c, cM0, G3, ER-, PR-, HER2+) - Signed by Earlie Server, MD on 08/28/2021 Histologic grading system: 3 grade system  -08/21/21 left breast ultrasound-guided biopsy showed invasive mammary carcinoma, grade 3, ER/PR negative, HER2 positive.  Left axillary lymph node biopsy positive for macro metastatic mammary carcinoma, 8 mm in greatest extent.   08/30/2021 Imaging   MRI brain w wo contrast -No metastatic disease or acute intracranial  abnormality. Essentially normal for age MRI appearance of the brain.    09/03/2021 Echocardiogram   1. Left ventricular ejection fraction, by estimation, is 55 to 60%. Left ventricular ejection fraction by 3D volume is 58 %. The left ventricle has normal function. The left ventricle has no regional wall motion abnormalities. There is mild left  ventricular hypertrophy. Left ventricular diastolic parameters were normal.  2. Right ventricular systolic function is normal. The right ventricular size is normal.  3. The mitral valve is normal in structure. No evidence of mitral valve regurgitation.  4. The aortic valve was not well visualized. Aortic valve regurgitation is not visualized   09/10/2021 Imaging   PET scan showed Large hypermetabolic left breast mass and diffuse skin thickening,consistent with primary breast carcinoma. Hypermetabolic lymphadenopathy in the left axilla, left subpectoral region, and left supraclavicular region, consistent with metastatic disease. No evidence of metastatic disease within the abdomen or pelvis   09/12/2021 -  Chemotherapy   Patient is on Treatment Plan : BREAST  Docetaxel + Carboplatin + Trastuzumab + Pertuzumab  (TCHP) q21d       Genetic Testing   Negative genetic testing. No pathogenic variants identified on the [facility] Common Hereditary Cancers+RNA panel. The report date is 10/26/2021.  The Common Hereditary Cancers Panel + RNA offered by [facility] includes sequencing and/or deletion duplication testing of the following 47 genes: APC, ATM, AXIN2, BARD1, BMPR1A, BRCA1, BRCA2, BRIP1, CDH1, CDKN2A (p14ARF), CDKN2A (p16INK4a), CKD4, CHEK2, CTNNA1, DICER1, EPCAM (Deletion/duplication testing only), GREM1 (promoter region deletion/duplication testing only), KIT, MEN1, MLH1, MSH2, MSH3, MSH6, MUTYH, NBN, NF1, NHTL1, PALB2, PDGFRA, PMS2, POLD1, POLE, PTEN, RAD50, RAD51C, RAD51D, SDHB, SDHC, SDHD, SMAD4, SMARCA4. STK11, TP53, TSC1, TSC2, and VHL.  The following genes  were evaluated for sequence changes only: SDHA and HOXB13 c.251G>A variant only.     ALLERGIES:  is allergic to bc powder [aspirin-salicylamide-caffeine], gabapentin, hydrocodone-acetaminophen, latex, penicillin g, penicillins, and shellfish allergy.  MEDICATIONS:  Current Outpatient Medications  Medication Sig Dispense Refill   albuterol (VENTOLIN HFA) 108 (90 Base) MCG/ACT inhaler Inhale 2 puffs into the lungs every 6 (six) hours as needed for wheezing or shortness of breath. 18 g 0   cetirizine (ZYRTEC ALLERGY) 10 MG tablet Take 1 tablet (10 mg total) by mouth daily. 90 tablet 0   diphenoxylate-atropine (LOMOTIL) 2.5-0.025 MG tablet Take 1 tablet by mouth 4 (four) times daily. 60 tablet 1   doxycycline (MONODOX) 100 MG capsule Take 100 mg by mouth 2 (two) times daily.     esomeprazole (NEXIUM) 40 MG capsule Take 1 capsule (40 mg total) by mouth daily at 12 noon. 30 capsule 3   FLOVENT DISKUS 250 MCG/ACT AEPB Inhale 1 puff into the lungs daily.     fluticasone (FLONASE) 50 MCG/ACT nasal spray Place 2 sprays into both nostrils daily. 15.8 mL 0   lidocaine-prilocaine (EMLA) cream Apply 1 Application topically as needed. 30 g 12   lisinopril (ZESTRIL) 10 MG tablet Take 10 mg by mouth daily.     loperamide (IMODIUM) 2 MG capsule Take 1 capsule (2 mg total) by  mouth See admin instructions. Initial: 4 mg,the 2 mg every 2 hours (4 mg every 4 hours at night)  maximum: 16 mg/day 90 capsule 1   magnesium chloride (SLOW-MAG) 64 MG TBEC SR tablet Take 1 tablet (64 mg total) by mouth daily. 30 tablet 3   montelukast (SINGULAIR) 10 MG tablet Take by mouth.     ondansetron (ZOFRAN) 8 MG tablet Take 1 tablet (8 mg total) by mouth 2 (two) times daily as needed (Nausea or vomiting). Start on the third day after chemotherapy. 30 tablet 1   oxyCODONE-acetaminophen (PERCOCET/ROXICET) 5-325 MG tablet Take 1 tablet by mouth every 6 (six) hours as needed for pain.     potassium chloride SA (KLOR-CON M) 20 MEQ  tablet Take 1 tablet (20 mEq total) by mouth 2 (two) times daily. 60 tablet 1   prochlorperazine (COMPAZINE) 10 MG tablet Take 1 tablet (10 mg total) by mouth every 6 (six) hours as needed (Nausea or vomiting). 30 tablet 1   zinc gluconate 50 MG tablet Take 50 mg by mouth daily.     albuterol (PROVENTIL) (2.5 MG/3ML) 0.083% nebulizer solution Take 3 mLs (2.5 mg total) by nebulization every 4 (four) hours as needed for wheezing or shortness of breath. (Patient not taking: Reported on 01/27/2022) 75 mL 2   dexamethasone (DECADRON) 4 MG tablet Take 2 tablets (8 mg total) by mouth daily. Start the day before Taxotere. Then take daily x 2 days after chemotherapy. (Patient not taking: Reported on 01/27/2022) 30 tablet 1   lisinopril-hydrochlorothiazide (ZESTORETIC) 10-12.5 MG tablet Take 1 tablet by mouth daily. (Patient not taking: Reported on 01/27/2022)     magic mouthwash (multi-ingredient) oral suspension Swish and spit 5-10 ml by  mouth 4 times a day as needed 400 mL 1   naloxone (NARCAN) nasal spray 4 mg/0.1 mL  (Patient not taking: Reported on 12/17/2021)     No current facility-administered medications for this visit.   Facility-Administered Medications Ordered in Other Visits  Medication Dose Route Frequency Provider Last Rate Last Admin   heparin lock flush 100 unit/mL  500 Units Intracatheter Once PRN Earlie Server, MD       potassium chloride 20 mEq in 100 mL IVPB  20 mEq Intravenous Once Hughie Closs, PA-C 100 mL/hr at 02/06/22 1456 20 mEq at 02/06/22 1456    VITAL SIGNS: BP 102/62   Temp 97.9 F (36.6 C) (Tympanic)   Resp 20   Ht '5\' 6"'$  (1.676 m)   Wt 191 lb 8 oz (86.9 kg)   BMI 30.91 kg/m  Filed Weights   02/06/22 1327  Weight: 191 lb 8 oz (86.9 kg)    Estimated body mass index is 30.91 kg/m as calculated from the following:   Height as of this encounter: '5\' 6"'$  (1.676 m).   Weight as of this encounter: 191 lb 8 oz (86.9 kg).  LABS: CBC:    Component Value Date/Time    WBC 5.7 02/06/2022 1357   HGB 8.4 (L) 02/06/2022 1357   HGB 11.9 (L) 05/27/2014 2215   HCT 25.4 (L) 02/06/2022 1357   HCT 36.4 05/27/2014 2215   PLT 95 (L) 02/06/2022 1357   PLT 293 05/27/2014 2215   MCV 87.3 02/06/2022 1357   MCV 81 05/27/2014 2215   NEUTROABS 2.9 02/06/2022 1357   NEUTROABS 5.4 05/27/2014 2215   LYMPHSABS 2.2 02/06/2022 1357   LYMPHSABS 3.0 05/27/2014 2215   MONOABS 0.5 02/06/2022 1357   MONOABS 0.6 05/27/2014 2215   EOSABS  0.0 02/06/2022 1357   EOSABS 0.0 05/27/2014 2215   BASOSABS 0.0 02/06/2022 1357   BASOSABS 0.0 05/27/2014 2215   Comprehensive Metabolic Panel:    Component Value Date/Time   NA 141 02/06/2022 1319   NA 143 07/08/2016 1259   NA 139 05/27/2014 2215   K 3.3 (L) 02/06/2022 1319   K 3.5 05/27/2014 2215   CL 106 02/06/2022 1319   CL 105 05/27/2014 2215   CO2 25 02/06/2022 1319   CO2 26 05/27/2014 2215   BUN 9 02/06/2022 1319   BUN 10 07/08/2016 1259   BUN 14 05/27/2014 2215   CREATININE 0.78 02/06/2022 1319   CREATININE 1.09 (H) 05/27/2014 2215   GLUCOSE 111 (H) 02/06/2022 1319   GLUCOSE 104 (H) 05/27/2014 2215   CALCIUM 7.8 (L) 02/06/2022 1319   CALCIUM 9.0 05/27/2014 2215   AST 22 01/28/2022 0956   AST < 5 (L) 08/05/2013 0832   ALT 19 01/28/2022 0956   ALT 24 08/05/2013 0832   ALKPHOS 94 01/28/2022 0956   ALKPHOS 97 08/05/2013 0832   BILITOT 0.6 01/28/2022 0956   BILITOT 0.4 07/08/2016 1259   BILITOT 0.4 08/05/2013 0832   PROT 7.3 01/28/2022 0956   PROT 7.0 07/08/2016 1259   PROT 6.8 08/05/2013 0832   ALBUMIN 4.1 01/28/2022 0956   ALBUMIN 4.0 07/08/2016 1259   ALBUMIN 3.4 08/05/2013 0832    RADIOGRAPHIC STUDIES: DG Chest 2 View  Result Date: 01/27/2022 CLINICAL DATA:  Cough, congestion EXAM: CHEST - 2 VIEW COMPARISON:  09/24/2021 FINDINGS: Right-sided Port-A-Cath with the tip projecting over the SVC. No focal consolidation. No pleural effusion or pneumothorax. Heart and mediastinal contours are unremarkable. No acute  osseous abnormality. IMPRESSION: No active cardiopulmonary disease. Electronically Signed   By: Kathreen Devoid M.D.   On: 01/27/2022 10:32    PERFORMANCE STATUS (ECOG) : 1 - Symptomatic but completely ambulatory  Review of Systems Unless otherwise noted, a complete review of systems is negative.  Physical Exam General: NAD. Appears brighter today  Cardiovascular: regular rate and rhythm Pulmonary: clear ant fields without wheeze or rhonchi Abdomen: soft, nontender, + bowel sounds GU: no suprapubic tenderness Extremities: no edema, no joint deformities Skin: no rashes Neurological: Weakness but otherwise nonfocal  Assessment and Plan- Patient is a 62 y.o. female  Encounter Diagnoses  Name Primary?   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 Neutropenia has resolved. Has some thrombocytopenia which may be reactive. Hemoglobin is down slightly but there is no evidence of bleeding. She is feeling much better today. Today she would benefit from IVF and 20 MEQ of K. I have suggested that she have a RTC Monday labs labs/fluids. She also asks for a refill of her magic mouthwash today which I have sent in for her.    Disposition 1L IVF today, 20 MEQ K RTC tomorrow [facility], labs(CBC w/, CMP, mag), +- fluids=-K/mag   Patient expressed understanding and was in agreement with this plan. She also understands that She can call clinic at any time with any questions, concerns, or complaints.   Thank you for allowing me to participate in the care of this very pleasant patient.   Time Total: 25  Visit consisted of counseling and education dealing  with the complex and emotionally intense issues of symptom management in the setting of serious  illness.Greater than 50%  of this time was spent counseling and coordinating care related to the above assessment and plan.  Signed by: Nelwyn Salisbury, PA-C

## 2022-02-09 ENCOUNTER — Inpatient Hospital Stay: Payer: Medicare HMO

## 2022-02-09 ENCOUNTER — Ambulatory Visit
Admission: RE | Admit: 2022-02-09 | Discharge: 2022-02-09 | Disposition: A | Discharge status: Home / Self Care | Payer: Medicare HMO | Source: Ambulatory Visit | Attending: Oncology | Admitting: Oncology

## 2022-02-09 ENCOUNTER — Ambulatory Visit: Payer: Medicare HMO

## 2022-02-09 ENCOUNTER — Inpatient Hospital Stay (HOSPITAL_BASED_OUTPATIENT_CLINIC_OR_DEPARTMENT_OTHER): Payer: Medicare HMO | Admitting: Medical Oncology

## 2022-02-09 VITALS — BP 108/62 | HR 74 | Temp 97.8°F | Resp 16 | Ht 66.0 in | Wt 198.0 lb

## 2022-02-09 DIAGNOSIS — D6481 Anemia due to antineoplastic chemotherapy: Secondary | ICD-10-CM

## 2022-02-09 DIAGNOSIS — Z5112 Encounter for antineoplastic immunotherapy: Secondary | ICD-10-CM | POA: Diagnosis not present

## 2022-02-09 DIAGNOSIS — E119 Type 2 diabetes mellitus without complications: Secondary | ICD-10-CM | POA: Diagnosis not present

## 2022-02-09 DIAGNOSIS — E876 Hypokalemia: Secondary | ICD-10-CM

## 2022-02-09 DIAGNOSIS — D709 Neutropenia, unspecified: Secondary | ICD-10-CM

## 2022-02-09 DIAGNOSIS — C50011 Malignant neoplasm of nipple and areola, right female breast: Secondary | ICD-10-CM

## 2022-02-09 DIAGNOSIS — T451X5A Adverse effect of antineoplastic and immunosuppressive drugs, initial encounter: Secondary | ICD-10-CM

## 2022-02-09 DIAGNOSIS — K521 Toxic gastroenteritis and colitis: Secondary | ICD-10-CM

## 2022-02-09 DIAGNOSIS — D696 Thrombocytopenia, unspecified: Secondary | ICD-10-CM

## 2022-02-09 DIAGNOSIS — Z09 Encounter for follow-up examination after completed treatment for conditions other than malignant neoplasm: Secondary | ICD-10-CM | POA: Diagnosis present

## 2022-02-09 DIAGNOSIS — C50919 Malignant neoplasm of unspecified site of unspecified female breast: Secondary | ICD-10-CM | POA: Diagnosis not present

## 2022-02-09 DIAGNOSIS — E860 Dehydration: Secondary | ICD-10-CM

## 2022-02-09 DIAGNOSIS — Z79899 Other long term (current) drug therapy: Secondary | ICD-10-CM | POA: Diagnosis not present

## 2022-02-09 DIAGNOSIS — Z0189 Encounter for other specified special examinations: Secondary | ICD-10-CM

## 2022-02-09 LAB — ECHOCARDIOGRAM COMPLETE
AR max vel: 2.38 cm2
AV Area VTI: 2.2 cm2
AV Area mean vel: 2.3 cm2
AV Mean grad: 4 mmHg
AV Peak grad: 8.3 mmHg
Ao pk vel: 1.44 m/s
Area-P 1/2: 3.33 cm2
S' Lateral: 2.8 cm

## 2022-02-09 LAB — CBC WITH DIFFERENTIAL/PLATELET
Abs Immature Granulocytes: 0.02 10*3/uL (ref 0.00–0.07)
Basophils Absolute: 0 10*3/uL (ref 0.0–0.1)
Basophils Relative: 0 %
Eosinophils Absolute: 0 10*3/uL (ref 0.0–0.5)
Eosinophils Relative: 0 %
HCT: 25 % — ABNORMAL LOW (ref 36.0–46.0)
Hemoglobin: 8 g/dL — ABNORMAL LOW (ref 12.0–15.0)
Immature Granulocytes: 1 %
Lymphocytes Relative: 44 %
Lymphs Abs: 1.9 10*3/uL (ref 0.7–4.0)
MCH: 28.6 pg (ref 26.0–34.0)
MCHC: 32 g/dL (ref 30.0–36.0)
MCV: 89.3 fL (ref 80.0–100.0)
Monocytes Absolute: 0.5 10*3/uL (ref 0.1–1.0)
Monocytes Relative: 11 %
Neutro Abs: 1.9 10*3/uL (ref 1.7–7.7)
Neutrophils Relative %: 44 %
Platelets: 122 10*3/uL — ABNORMAL LOW (ref 150–400)
RBC: 2.8 MIL/uL — ABNORMAL LOW (ref 3.87–5.11)
RDW: 16.1 % — ABNORMAL HIGH (ref 11.5–15.5)
WBC: 4.3 10*3/uL (ref 4.0–10.5)
nRBC: 0 % (ref 0.0–0.2)

## 2022-02-09 LAB — MAGNESIUM: Magnesium: 1.3 mg/dL — ABNORMAL LOW (ref 1.7–2.4)

## 2022-02-09 LAB — COMPREHENSIVE METABOLIC PANEL WITH GFR
ALT: 18 U/L (ref 0–44)
AST: 21 U/L (ref 15–41)
Albumin: 3.4 g/dL — ABNORMAL LOW (ref 3.5–5.0)
Alkaline Phosphatase: 76 U/L (ref 38–126)
Anion gap: 6 (ref 5–15)
BUN: 13 mg/dL (ref 6–20)
CO2: 25 mmol/L (ref 22–32)
Calcium: 8.5 mg/dL — ABNORMAL LOW (ref 8.9–10.3)
Chloride: 107 mmol/L (ref 98–111)
Creatinine, Ser: 0.8 mg/dL (ref 0.44–1.00)
GFR, Estimated: >60 mL/min
Glucose, Bld: 98 mg/dL (ref 70–99)
Potassium: 4.2 mmol/L (ref 3.5–5.1)
Sodium: 138 mmol/L (ref 135–145)
Total Bilirubin: 0.2 mg/dL — ABNORMAL LOW (ref 0.3–1.2)
Total Protein: 5.9 g/dL — ABNORMAL LOW (ref 6.5–8.1)

## 2022-02-09 MED ORDER — SODIUM CHLORIDE 0.9% FLUSH
10.0000 mL | Freq: Once | INTRAVENOUS | Status: AC
  Administered 2022-02-09: 10 mL via INTRAVENOUS
  Filled 2022-02-09: qty 10

## 2022-02-09 MED ORDER — MAGNESIUM SULFATE 2 GM/50ML IV SOLN: 2.0000 g | Freq: Once | INTRAVENOUS | Status: AC

## 2022-02-09 MED ORDER — HEPARIN SOD (PORK) LOCK FLUSH 100 UNIT/ML IV SOLN
500.0000 [IU] | Freq: Once | INTRAVENOUS | Status: AC
  Administered 2022-02-09: 500 [IU] via INTRAVENOUS
  Filled 2022-02-09: qty 5

## 2022-02-09 NOTE — Progress Notes (Signed)
Mg 1.3 today. Pt unable to stay for infusion. Pt to return to clinic 1/9 at [place] for Magnesium infusion. Port left accessed. Pt verbalized understanding to not get area wet. Discharged home

## 2022-02-09 NOTE — Progress Notes (Signed)
*  PRELIMINARY RESULTS* Echocardiogram 2D Echocardiogram has been performed.  Sherrie Sport 02/09/2022, 12:02 PM

## 2022-02-09 NOTE — Progress Notes (Signed)
Symptom Management Reform at [facility] [phone] [phone]  Patient Care Team: [facility] as PCP - General Daiva Huge, RN as Oncology Nurse Navigator Earlie Server, MD as Consulting Physician (Oncology)   Name of the patient: Vanessa Fisher  [id number]  03-29-1960   Date of visit: 02/09/22  Reason for Consult: Vanessa Fisher is a 62 y.o. female with stage IIIB ER/PR-, HER2+ left breast cancer currently on TCHP + Udenyca s/p cycle 5 on 01/21/2022 who presents today for:  Diarrhea: Was seen for diarrhea and fatigue on 01/27/2022. Found to have neutropenia, hypomagnesemia and hypokalemia and started on Levaquin as well as supplemental electrolytes. Has done well with this. She presents today for lab and symptom recheck. She also returned on 01/28/2022, 02/06/2022. Continuing to do better. Today she reports that she is feeling much better overall and her diarrhea has resolved. No fevers.    Wt Readings from Last 3 Encounters:  02/09/22 198 lb (89.8 kg)  02/06/22 191 lb 8 oz (86.9 kg)  01/21/22 195 lb (88.5 kg)    PAST MEDICAL HISTORY: Past Medical History:  Diagnosis Date   Anemia    Anxiety    Arthritis    Asthma    WELL CONTROLLED   Bile acid malabsorption syndrome    Bradycardia    HAD AN ISSUE WITH THIS IN 2016-NO PROBLEMS SINCE   Breast cancer, left breast ([place]) 08/2021   Carpal tunnel syndrome on right    Depression    Diabetes mellitus without complication (HCC)    Diverticulitis    Gastric reflux    GERD (gastroesophageal reflux disease)    Hand pain 05/12/2016   Headache    H/O MIGRAINES   History of kidney stones    H/O   Lumbar radiculitis    Neck pain, chronic    Osteoarthritis of both knees    Panic attacks     PAST SURGICAL HISTORY:  Past Surgical History:  Procedure Laterality Date   BREAST BIOPSY Left 08/21/2021   Axilla Bx, Hydromarker, path pending   BREAST BIOPSY Left  08/21/2021   [place] Bx, Ribbon Clip, Path Pending   CARPAL TUNNEL RELEASE Right 12/06/2017   Procedure: CARPAL TUNNEL RELEASE;  Surgeon: Earnestine Leys, MD;  Location: [facility] ORS;  Service: Orthopedics;  Laterality: Right;   COLONOSCOPY WITH PROPOFOL N/A 06/11/2021   Procedure: COLONOSCOPY WITH PROPOFOL;  Surgeon: Lin Landsman, MD;  Location: [facility] ENDOSCOPY;  Service: Gastroenterology;  Laterality: N/A;   DORSAL COMPARTMENT RELEASE Right 12/06/2017   Procedure: RELEASE DORSAL COMPARTMENT (DEQUERVAIN);  Surgeon: Earnestine Leys, MD;  Location: [facility] ORS;  Service: Orthopedics;  Laterality: Right;   ESOPHAGOGASTRODUODENOSCOPY (EGD) WITH PROPOFOL N/A 06/11/2021   Procedure: ESOPHAGOGASTRODUODENOSCOPY (EGD) WITH PROPOFOL;  Surgeon: Lin Landsman, MD;  Location: [facility] ENDOSCOPY;  Service: Gastroenterology;  Laterality: N/A;   PARTIAL HYSTERECTOMY     PORTACATH PLACEMENT N/A 09/24/2021   Procedure: INSERTION PORT-A-CATH;  Surgeon: Herbert Pun, MD;  Location: [facility] ORS;  Service: General;  Laterality: N/A;   TUBAL LIGATION      HEMATOLOGY/ONCOLOGY HISTORY:  Oncology History  Breast cancer ([place])  07/31/2021 Imaging   Bilateral diagnostic mammogram and US showed 5 centimeter LEFT breast mass associated with pleomorphic calcifications is suspicious for invasive ductal carcinoma.At least 4 LEFT axillary lymph nodes with abnormal morphology   08/21/2021 Cancer Staging   Staging form: Breast, AJCC 8th Edition - Clinical: Stage IIIB (cT4b, cN3c, cM0, G3, ER-, PR-, HER2+) -  Signed by Earlie Server, MD on 08/28/2021 Histologic grading system: 3 grade system  -08/21/21 left breast ultrasound-guided biopsy showed invasive mammary carcinoma, grade 3, ER/PR negative, HER2 positive.  Left axillary lymph node biopsy positive for macro metastatic mammary carcinoma, 8 mm in greatest extent.   08/30/2021 Imaging   MRI brain w wo contrast -No metastatic disease or acute intracranial abnormality. Essentially  normal for age MRI appearance of the brain.    09/03/2021 Echocardiogram   1. Left ventricular ejection fraction, by estimation, is 55 to 60%. Left ventricular ejection fraction by 3D volume is 58 %. The left ventricle has normal function. The left ventricle has no regional wall motion abnormalities. There is mild left  ventricular hypertrophy. Left ventricular diastolic parameters were normal.  2. Right ventricular systolic function is normal. The right ventricular size is normal.  3. The mitral valve is normal in structure. No evidence of mitral valve regurgitation.  4. The aortic valve was not well visualized. Aortic valve regurgitation is not visualized   09/10/2021 Imaging   PET scan showed Large hypermetabolic left breast mass and diffuse skin thickening,consistent with primary breast carcinoma. Hypermetabolic lymphadenopathy in the left axilla, left subpectoral region, and left supraclavicular region, consistent with metastatic disease. No evidence of metastatic disease within the abdomen or pelvis   09/12/2021 -  Chemotherapy   Patient is on Treatment Plan : BREAST  Docetaxel + Carboplatin + Trastuzumab + Pertuzumab  (TCHP) q21d       Genetic Testing   Negative genetic testing. No pathogenic variants identified on the [facility] Common Hereditary Cancers+RNA panel. The report date is 10/26/2021.  The Common Hereditary Cancers Panel + RNA offered by [facility] includes sequencing and/or deletion duplication testing of the following 47 genes: APC, ATM, AXIN2, BARD1, BMPR1A, BRCA1, BRCA2, BRIP1, CDH1, CDKN2A (p14ARF), CDKN2A (p16INK4a), CKD4, CHEK2, CTNNA1, DICER1, EPCAM (Deletion/duplication testing only), GREM1 (promoter region deletion/duplication testing only), KIT, MEN1, MLH1, MSH2, MSH3, MSH6, MUTYH, NBN, NF1, NHTL1, PALB2, PDGFRA, PMS2, POLD1, POLE, PTEN, RAD50, RAD51C, RAD51D, SDHB, SDHC, SDHD, SMAD4, SMARCA4. STK11, TP53, TSC1, TSC2, and VHL.  The following genes were evaluated for sequence  changes only: SDHA and HOXB13 c.251G>A variant only.     ALLERGIES:  is allergic to bc powder [aspirin-salicylamide-caffeine], doxycycline, gabapentin, hydrocodone-acetaminophen, latex, penicillin g, penicillins, and shellfish allergy.  MEDICATIONS:  Current Outpatient Medications  Medication Sig Dispense Refill   albuterol (VENTOLIN HFA) 108 (90 Base) MCG/ACT inhaler Inhale 2 puffs into the lungs every 6 (six) hours as needed for wheezing or shortness of breath. 18 g 0   cetirizine (ZYRTEC ALLERGY) 10 MG tablet Take 1 tablet (10 mg total) by mouth daily. 90 tablet 0   dexamethasone (DECADRON) 4 MG tablet Take 2 tablets (8 mg total) by mouth daily. Start the day before Taxotere. Then take daily x 2 days after chemotherapy. 30 tablet 1   diphenoxylate-atropine (LOMOTIL) 2.5-0.025 MG tablet Take 1 tablet by mouth 4 (four) times daily. 60 tablet 1   esomeprazole (NEXIUM) 40 MG capsule Take 1 capsule (40 mg total) by mouth daily at 12 noon. 30 capsule 3   FLOVENT DISKUS 250 MCG/ACT AEPB Inhale 1 puff into the lungs daily.     fluticasone (FLONASE) 50 MCG/ACT nasal spray Place 2 sprays into both nostrils daily. 15.8 mL 0   lidocaine-prilocaine (EMLA) cream Apply 1 Application topically as needed. 30 g 12   lisinopril (ZESTRIL) 10 MG tablet Take 10 mg by mouth daily.     loperamide (IMODIUM) 2 MG  capsule Take 1 capsule (2 mg total) by mouth See admin instructions. Initial: 4 mg,the 2 mg every 2 hours (4 mg every 4 hours at night)  maximum: 16 mg/day 90 capsule 1   magnesium chloride (SLOW-MAG) 64 MG TBEC SR tablet Take 1 tablet (64 mg total) by mouth daily. 30 tablet 3   montelukast (SINGULAIR) 10 MG tablet Take by mouth.     ondansetron (ZOFRAN) 8 MG tablet Take 1 tablet (8 mg total) by mouth 2 (two) times daily as needed (Nausea or vomiting). Start on the third day after chemotherapy. 30 tablet 1   oxyCODONE-acetaminophen (PERCOCET/ROXICET) 5-325 MG tablet Take 1 tablet by mouth every 6 (six) hours  as needed for pain.     potassium chloride SA (KLOR-CON M) 20 MEQ tablet Take 1 tablet (20 mEq total) by mouth 2 (two) times daily. 60 tablet 1   prochlorperazine (COMPAZINE) 10 MG tablet Take 1 tablet (10 mg total) by mouth every 6 (six) hours as needed (Nausea or vomiting). 30 tablet 1   zinc gluconate 50 MG tablet Take 50 mg by mouth daily.     albuterol (PROVENTIL) (2.5 MG/3ML) 0.083% nebulizer solution Take 3 mLs (2.5 mg total) by nebulization every 4 (four) hours as needed for wheezing or shortness of breath. (Patient not taking: Reported on 01/27/2022) 75 mL 2   lisinopril-hydrochlorothiazide (ZESTORETIC) 10-12.5 MG tablet Take 1 tablet by mouth daily. (Patient not taking: Reported on 01/27/2022)     magic mouthwash (multi-ingredient) oral suspension Swish and spit 5-10 ml by mouth 4 times a day as needed (Patient not taking: Reported on 02/09/2022) 400 mL 1   naloxone (NARCAN) nasal spray 4 mg/0.1 mL  (Patient not taking: Reported on 12/17/2021)     No current facility-administered medications for this visit.   Facility-Administered Medications Ordered in Other Visits  Medication Dose Route Frequency Provider Last Rate Last Admin   magnesium sulfate IVPB 2 g 50 mL  2 g Intravenous Once Zetha Kuhar M, PA-C        VITAL SIGNS: BP 108/62 (BP Location: Right Arm, Patient Position: Sitting, Cuff Size: Large)   Pulse 74   Temp 97.8 F (36.6 C) (Tympanic)   Resp 16   Ht '5\' 6"'$  (1.676 m)   Wt 198 lb (89.8 kg)   SpO2 100%   BMI 31.96 kg/m  Filed Weights   02/09/22 1345  Weight: 198 lb (89.8 kg)    Estimated body mass index is 31.96 kg/m as calculated from the following:   Height as of this encounter: '5\' 6"'$  (1.676 m).   Weight as of this encounter: 198 lb (89.8 kg).  LABS: CBC:    Component Value Date/Time   WBC 4.3 02/09/2022 1323   HGB 8.0 (L) 02/09/2022 1323   HGB 11.9 (L) 05/27/2014 2215   HCT 25.0 (L) 02/09/2022 1323   HCT 36.4 05/27/2014 2215   PLT 122 (L)  02/09/2022 1323   PLT 293 05/27/2014 2215   MCV 89.3 02/09/2022 1323   MCV 81 05/27/2014 2215   NEUTROABS 1.9 02/09/2022 1323   NEUTROABS 5.4 05/27/2014 2215   LYMPHSABS 1.9 02/09/2022 1323   LYMPHSABS 3.0 05/27/2014 2215   MONOABS 0.5 02/09/2022 1323   MONOABS 0.6 05/27/2014 2215   EOSABS 0.0 02/09/2022 1323   EOSABS 0.0 05/27/2014 2215   BASOSABS 0.0 02/09/2022 1323   BASOSABS 0.0 05/27/2014 2215   Comprehensive Metabolic Panel:    Component Value Date/Time   NA 138 02/09/2022 1323  NA 143 07/08/2016 1259   NA 139 05/27/2014 2215   K 4.2 02/09/2022 1323   K 3.5 05/27/2014 2215   CL 107 02/09/2022 1323   CL 105 05/27/2014 2215   CO2 25 02/09/2022 1323   CO2 26 05/27/2014 2215   BUN 13 02/09/2022 1323   BUN 10 07/08/2016 1259   BUN 14 05/27/2014 2215   CREATININE 0.80 02/09/2022 1323   CREATININE 1.09 (H) 05/27/2014 2215   GLUCOSE 98 02/09/2022 1323   GLUCOSE 104 (H) 05/27/2014 2215   CALCIUM 8.5 (L) 02/09/2022 1323   CALCIUM 9.0 05/27/2014 2215   AST 21 02/09/2022 1323   AST < 5 (L) 08/05/2013 0832   ALT 18 02/09/2022 1323   ALT 24 08/05/2013 0832   ALKPHOS 76 02/09/2022 1323   ALKPHOS 97 08/05/2013 0832   BILITOT 0.2 (L) 02/09/2022 1323   BILITOT 0.4 07/08/2016 1259   BILITOT 0.4 08/05/2013 0832   PROT 5.9 (L) 02/09/2022 1323   PROT 7.0 07/08/2016 1259   PROT 6.8 08/05/2013 0832   ALBUMIN 3.4 (L) 02/09/2022 1323   ALBUMIN 4.0 07/08/2016 1259   ALBUMIN 3.4 08/05/2013 0832    RADIOGRAPHIC STUDIES: ECHOCARDIOGRAM COMPLETE  Result Date: 02/09/2022    ECHOCARDIOGRAM REPORT   Patient Name:   Vanessa Fisher Date of Exam: 02/09/2022 Medical Rec #:  [id number]            Height:       66.0 in Accession #:    [id number]           Weight:       191.5 lb Date of Birth:  Sep 28, 1960            BSA:          1.963 m Patient Age:    19 years             BP:           102/62 mmHg Patient Gender: F                    HR:           73 bpm. Exam Location:  [facility] Procedure: 2D  Echo, Cardiac Doppler, Color Doppler and Strain Analysis Indications:     Chemo Z09  History:         Patient has prior history of Echocardiogram examinations, most                  recent 09/03/2021. Risk Factors:Diabetes. Breast cancer.  Sonographer:     Sherrie Sport Referring Phys:  [id number] Gladwin YU Diagnosing Phys: Kathlyn Sacramento MD  Sonographer Comments: Global longitudinal strain was attempted. IMPRESSIONS  1. Left ventricular ejection fraction, by estimation, is 55 to 60%. The left ventricle has normal function. The left ventricle has no regional wall motion abnormalities. Left ventricular diastolic parameters were normal. The average left ventricular global longitudinal strain is -19.1 %. The global longitudinal strain is normal.  2. Right ventricular systolic function is normal. The right ventricular size is normal. There is normal pulmonary artery systolic pressure.  3. The mitral valve is normal in structure. Trivial mitral valve regurgitation. No evidence of mitral stenosis.  4. The aortic valve is normal in structure. Aortic valve regurgitation is not visualized. No aortic stenosis is present.  5. The inferior vena cava is normal in size with greater than 50% respiratory variability, suggesting right atrial pressure of 3 mmHg. FINDINGS  Left Ventricle: Left ventricular  ejection fraction, by estimation, is 55 to 60%. The left ventricle has normal function. The left ventricle has no regional wall motion abnormalities. The average left ventricular global longitudinal strain is -19.1 %. The global longitudinal strain is normal. The left ventricular internal cavity size was normal in size. There is no left ventricular hypertrophy. Left ventricular diastolic parameters were normal. Right Ventricle: The right ventricular size is normal. No increase in right ventricular wall thickness. Right ventricular systolic function is normal. There is normal pulmonary artery systolic pressure. The tricuspid regurgitant  velocity is 2.57 m/s, and  with an assumed right atrial pressure of 3 mmHg, the estimated right ventricular systolic pressure is 94.7 mmHg. Left Atrium: Left atrial size was normal in size. Right Atrium: Right atrial size was normal in size. Pericardium: There is no evidence of pericardial effusion. Mitral Valve: The mitral valve is normal in structure. Trivial mitral valve regurgitation. No evidence of mitral valve stenosis. Tricuspid Valve: The tricuspid valve is normal in structure. Tricuspid valve regurgitation is trivial. No evidence of tricuspid stenosis. Aortic Valve: The aortic valve is normal in structure. Aortic valve regurgitation is not visualized. No aortic stenosis is present. Aortic valve mean gradient measures 4.0 mmHg. Aortic valve peak gradient measures 8.3 mmHg. Aortic valve area, by VTI measures 2.20 cm. Pulmonic Valve: The pulmonic valve was normal in structure. Pulmonic valve regurgitation is not visualized. No evidence of pulmonic stenosis. Aorta: The aortic root is normal in size and structure. Venous: The inferior vena cava is normal in size with greater than 50% respiratory variability, suggesting right atrial pressure of 3 mmHg. IAS/Shunts: No atrial level shunt detected by color flow Doppler.  LEFT VENTRICLE PLAX 2D LVIDd:         4.40 cm   Diastology LVIDs:         2.80 cm   LV e' medial:    13.50 cm/s LV PW:         1.00 cm   LV E/e' medial:  7.5 LV IVS:        1.10 cm   LV e' lateral:   14.10 cm/s LVOT diam:     2.00 cm   LV E/e' lateral: 7.2 LV SV:         65 LV SV Index:   33        2D Longitudinal Strain LVOT Area:     3.14 cm  2D Strain GLS Avg:     -19.1 %  RIGHT VENTRICLE RV Basal diam:  3.30 cm RV Mid diam:    2.80 cm LEFT ATRIUM           Index        RIGHT ATRIUM          Index LA diam:      3.80 cm 1.94 cm/m   RA Area:     9.71 cm LA Vol (A2C): 46.1 ml 23.48 ml/m  RA Volume:   18.70 ml 9.53 ml/m LA Vol (A4C): 16.9 ml 8.61 ml/m  AORTIC VALVE AV Area (Vmax):    2.38 cm  AV Area (Vmean):   2.30 cm AV Area (VTI):     2.20 cm AV Vmax:           144.00 cm/s AV Vmean:          96.900 cm/s AV VTI:            0.294 m AV Peak Grad:      8.3 mmHg AV Mean Grad:  4.0 mmHg LVOT Vmax:         109.00 cm/s LVOT Vmean:        71.000 cm/s LVOT VTI:          0.206 m LVOT/AV VTI ratio: 0.70  AORTA Ao Root diam: 2.50 cm MITRAL VALVE                TRICUSPID VALVE MV Area (PHT): 3.33 cm     TR Peak grad:   26.4 mmHg MV Decel Time: 228 msec     TR Vmax:        257.00 cm/s MV E velocity: 101.00 cm/s MV A velocity: 61.30 cm/s   SHUNTS MV E/A ratio:  1.65         Systemic VTI:  0.21 m                             Systemic Diam: 2.00 cm Kathlyn Sacramento MD Electronically signed by Kathlyn Sacramento MD Signature Date/Time: 02/09/2022/2:07:19 PM    Final    DG Chest 2 View  Result Date: 01/27/2022 CLINICAL DATA:  Cough, congestion EXAM: CHEST - 2 VIEW COMPARISON:  09/24/2021 FINDINGS: Right-sided Port-A-Cath with the tip projecting over the SVC. No focal consolidation. No pleural effusion or pneumothorax. Heart and mediastinal contours are unremarkable. No acute osseous abnormality. IMPRESSION: No active cardiopulmonary disease. Electronically Signed   By: Kathreen Devoid M.D.   On: 01/27/2022 10:32    PERFORMANCE STATUS (ECOG) : 1 - Symptomatic but completely ambulatory  Review of Systems Unless otherwise noted, a complete review of systems is negative.  Physical Exam General: NAD. Appears brighter today  Cardiovascular: regular rate and rhythm Pulmonary: clear ant fields without wheeze or rhonchi Abdomen: soft, nontender, + bowel sounds GU: no suprapubic tenderness Extremities: no edema, no joint deformities Skin: no rashes Neurological: Weakness but otherwise nonfocal  Assessment and Plan- Patient is a 62 y.o. female  Encounter Diagnoses  Name Primary?   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 Diarrhea and subsequent dehydration has resolved. Hypokalemia has resolved. Hypomagnesemia still present but not as severe as it has been. Hemoglobin is down a bit from 8.4 to 8.0. No evidence of bleeding. Will watch closely- has follow up in 2 days with labs. Neutropenia has resolved. Thrombocytopenia has improved. She is unable to stay today but will return tomorrow.    Disposition 500 ml IVF, 2g mag tomorrow  Keep follow up with Dr. Tasia Catchings on 11/12/2022  Patient expressed understanding and was in agreement with this plan. She also understands that She can call clinic at any time with any questions, concerns, or complaints.   Thank you for allowing me to participate in the care of this very pleasant patient.   Time Total: 25  Visit consisted of counseling and education dealing with the complex and emotionally intense issues of symptom management in the setting of serious illness.Greater than 50%  of this time was spent counseling and coordinating care related to the above assessment and plan.  Signed by: Nelwyn Salisbury, PA-C

## 2022-02-10 ENCOUNTER — Inpatient Hospital Stay: Payer: Medicare HMO

## 2022-02-10 VITALS — BP 94/57 | HR 75 | Temp 98.0°F | Resp 20

## 2022-02-10 DIAGNOSIS — Z5112 Encounter for antineoplastic immunotherapy: Secondary | ICD-10-CM | POA: Diagnosis not present

## 2022-02-10 DIAGNOSIS — C50011 Malignant neoplasm of nipple and areola, right female breast: Secondary | ICD-10-CM

## 2022-02-10 DIAGNOSIS — T451X5A Adverse effect of antineoplastic and immunosuppressive drugs, initial encounter: Secondary | ICD-10-CM

## 2022-02-10 MED ORDER — MAGNESIUM SULFATE 2 GM/50ML IV SOLN
2.0000 g | Freq: Once | INTRAVENOUS | Status: AC
  Administered 2022-02-10: 2 g via INTRAVENOUS
  Filled 2022-02-10: qty 50

## 2022-02-10 MED ORDER — SODIUM CHLORIDE 0.9 % IV SOLN
Freq: Once | INTRAVENOUS | Status: AC
  Filled 2022-02-10: qty 250

## 2022-02-10 MED ORDER — SODIUM CHLORIDE 0.9% FLUSH
10.0000 mL | Freq: Once | INTRAVENOUS | Status: AC | PRN
  Administered 2022-02-10: 10 mL
  Filled 2022-02-10: qty 10

## 2022-02-10 MED ORDER — SODIUM CHLORIDE 0.9 % IV SOLN
Freq: Once | INTRAVENOUS | Status: DC
  Filled 2022-02-10: qty 250

## 2022-02-10 MED ORDER — HEPARIN SOD (PORK) LOCK FLUSH 100 UNIT/ML IV SOLN
500.0000 [IU] | Freq: Once | INTRAVENOUS | Status: AC | PRN
  Administered 2022-02-10: 500 [IU]
  Filled 2022-02-10: qty 5

## 2022-02-10 MED FILL — Fosaprepitant Dimeglumine For IV Infusion 150 MG (Base Eq): INTRAVENOUS | Qty: 5 | Status: AC

## 2022-02-10 MED FILL — Dexamethasone Sodium Phosphate Inj 100 MG/10ML: INTRAMUSCULAR | Qty: 1 | Status: AC

## 2022-02-11 ENCOUNTER — Other Ambulatory Visit: Payer: Self-pay

## 2022-02-11 ENCOUNTER — Encounter: Payer: Self-pay | Admitting: Oncology

## 2022-02-11 ENCOUNTER — Other Ambulatory Visit: Payer: Medicare HMO

## 2022-02-11 ENCOUNTER — Inpatient Hospital Stay: Payer: Medicare HMO

## 2022-02-11 ENCOUNTER — Ambulatory Visit: Payer: Medicare HMO

## 2022-02-11 ENCOUNTER — Inpatient Hospital Stay (HOSPITAL_BASED_OUTPATIENT_CLINIC_OR_DEPARTMENT_OTHER): Payer: Medicare HMO | Admitting: Oncology

## 2022-02-11 ENCOUNTER — Ambulatory Visit: Payer: Medicare HMO | Admitting: Oncology

## 2022-02-11 VITALS — BP 111/65 | HR 70 | Temp 97.7°F | Resp 18 | Wt 203.8 lb

## 2022-02-11 DIAGNOSIS — Z171 Estrogen receptor negative status [ER-]: Secondary | ICD-10-CM

## 2022-02-11 DIAGNOSIS — E876 Hypokalemia: Secondary | ICD-10-CM | POA: Diagnosis not present

## 2022-02-11 DIAGNOSIS — J328 Other chronic sinusitis: Secondary | ICD-10-CM

## 2022-02-11 DIAGNOSIS — K521 Toxic gastroenteritis and colitis: Secondary | ICD-10-CM

## 2022-02-11 DIAGNOSIS — C50812 Malignant neoplasm of overlapping sites of left female breast: Secondary | ICD-10-CM

## 2022-02-11 DIAGNOSIS — T451X5A Adverse effect of antineoplastic and immunosuppressive drugs, initial encounter: Secondary | ICD-10-CM

## 2022-02-11 DIAGNOSIS — L271 Localized skin eruption due to drugs and medicaments taken internally: Secondary | ICD-10-CM

## 2022-02-11 DIAGNOSIS — C50011 Malignant neoplasm of nipple and areola, right female breast: Secondary | ICD-10-CM | POA: Diagnosis not present

## 2022-02-11 DIAGNOSIS — Z5112 Encounter for antineoplastic immunotherapy: Secondary | ICD-10-CM | POA: Diagnosis not present

## 2022-02-11 DIAGNOSIS — D6481 Anemia due to antineoplastic chemotherapy: Secondary | ICD-10-CM

## 2022-02-11 DIAGNOSIS — Z5111 Encounter for antineoplastic chemotherapy: Secondary | ICD-10-CM

## 2022-02-11 LAB — CBC WITH DIFFERENTIAL/PLATELET
Abs Immature Granulocytes: 0.05 10*3/uL (ref 0.00–0.07)
Basophils Absolute: 0 10*3/uL (ref 0.0–0.1)
Basophils Relative: 0 %
Eosinophils Absolute: 0 10*3/uL (ref 0.0–0.5)
Eosinophils Relative: 0 %
HCT: 25.6 % — ABNORMAL LOW (ref 36.0–46.0)
Hemoglobin: 8.3 g/dL — ABNORMAL LOW (ref 12.0–15.0)
Immature Granulocytes: 1 %
Lymphocytes Relative: 20 %
Lymphs Abs: 1.2 10*3/uL (ref 0.7–4.0)
MCH: 29.1 pg (ref 26.0–34.0)
MCHC: 32.4 g/dL (ref 30.0–36.0)
MCV: 89.8 fL (ref 80.0–100.0)
Monocytes Absolute: 0.6 10*3/uL (ref 0.1–1.0)
Monocytes Relative: 10 %
Neutro Abs: 4.1 10*3/uL (ref 1.7–7.7)
Neutrophils Relative %: 69 %
Platelets: 186 10*3/uL (ref 150–400)
RBC: 2.85 MIL/uL — ABNORMAL LOW (ref 3.87–5.11)
RDW: 16.5 % — ABNORMAL HIGH (ref 11.5–15.5)
WBC: 5.9 10*3/uL (ref 4.0–10.5)
nRBC: 0 % (ref 0.0–0.2)

## 2022-02-11 LAB — COMPREHENSIVE METABOLIC PANEL
ALT: 19 U/L (ref 0–44)
AST: 24 U/L (ref 15–41)
Albumin: 3.6 g/dL (ref 3.5–5.0)
Alkaline Phosphatase: 77 U/L (ref 38–126)
Anion gap: 6 (ref 5–15)
BUN: 16 mg/dL (ref 6–20)
CO2: 25 mmol/L (ref 22–32)
Calcium: 8.8 mg/dL — ABNORMAL LOW (ref 8.9–10.3)
Chloride: 108 mmol/L (ref 98–111)
Creatinine, Ser: 0.87 mg/dL (ref 0.44–1.00)
GFR, Estimated: >60 mL/min (ref >60)
Glucose, Bld: 93 mg/dL (ref 70–99)
Potassium: 4.5 mmol/L (ref 3.5–5.1)
Sodium: 139 mmol/L (ref 135–145)
Total Bilirubin: 0.2 mg/dL — ABNORMAL LOW (ref 0.3–1.2)
Total Protein: 6.5 g/dL (ref 6.5–8.1)

## 2022-02-11 LAB — MAGNESIUM: Magnesium: 1.8 mg/dL (ref 1.7–2.4)

## 2022-02-11 MED ORDER — TRASTUZUMAB-ANNS CHEMO 420 MG IV SOLR
6.0000 mg/kg | Freq: Once | INTRAVENOUS | Status: AC
  Administered 2022-02-11: 600 mg via INTRAVENOUS
  Filled 2022-02-11: qty 28.57

## 2022-02-11 MED ORDER — ACETAMINOPHEN 325 MG PO TABS
650.0000 mg | ORAL_TABLET | Freq: Once | ORAL | Status: AC
  Administered 2022-02-11: 650 mg via ORAL
  Filled 2022-02-11: qty 2

## 2022-02-11 MED ORDER — PALONOSETRON HCL INJECTION 0.25 MG/5ML
0.2500 mg | Freq: Once | INTRAVENOUS | Status: AC
  Administered 2022-02-11: 0.25 mg via INTRAVENOUS
  Filled 2022-02-11: qty 5

## 2022-02-11 MED ORDER — SODIUM CHLORIDE 0.9 % IV SOLN
420.0000 mg | Freq: Once | INTRAVENOUS | Status: AC
  Administered 2022-02-11: 420 mg via INTRAVENOUS
  Filled 2022-02-11: qty 14

## 2022-02-11 MED ORDER — HEPARIN SOD (PORK) LOCK FLUSH 100 UNIT/ML IV SOLN
500.0000 [IU] | Freq: Once | INTRAVENOUS | Status: AC | PRN
  Administered 2022-02-11: 500 [IU]
  Filled 2022-02-11: qty 5

## 2022-02-11 MED ORDER — AZELASTINE HCL 137 MCG/SPRAY NA SOLN: 1.0000 | Freq: Every day | NASAL | 0 refills | Status: AC

## 2022-02-11 MED ORDER — DIPHENHYDRAMINE HCL 25 MG PO CAPS
50.0000 mg | ORAL_CAPSULE | Freq: Once | ORAL | Status: AC
  Administered 2022-02-11: 50 mg via ORAL
  Filled 2022-02-11: qty 2

## 2022-02-11 MED ORDER — MONTELUKAST SODIUM 10 MG PO TABS
10.0000 mg | ORAL_TABLET | Freq: Once | ORAL | Status: AC
  Administered 2022-02-11: 10 mg via ORAL
  Filled 2022-02-11: qty 1

## 2022-02-11 MED ORDER — SODIUM CHLORIDE 0.9 % IV SOLN
75.0000 mg/m2 | Freq: Once | INTRAVENOUS | Status: AC
  Administered 2022-02-11: 160 mg via INTRAVENOUS
  Filled 2022-02-11: qty 16

## 2022-02-11 MED ORDER — SODIUM CHLORIDE 0.9 % IV SOLN
10.0000 mg | Freq: Once | INTRAVENOUS | Status: AC
  Administered 2022-02-11: 10 mg via INTRAVENOUS
  Filled 2022-02-11: qty 10

## 2022-02-11 MED ORDER — SODIUM CHLORIDE 0.9 % IV SOLN
Freq: Once | INTRAVENOUS | Status: AC
  Filled 2022-02-11: qty 250

## 2022-02-11 MED ORDER — SODIUM CHLORIDE 0.9 % IV SOLN
150.0000 mg | Freq: Once | INTRAVENOUS | Status: AC
  Administered 2022-02-11: 150 mg via INTRAVENOUS
  Filled 2022-02-11: qty 150

## 2022-02-11 MED ORDER — SODIUM CHLORIDE 0.9 % IV SOLN
796.2000 mg | Freq: Once | INTRAVENOUS | Status: AC
  Administered 2022-02-11: 800 mg via INTRAVENOUS
  Filled 2022-02-11: qty 80

## 2022-02-11 NOTE — Assessment & Plan Note (Addendum)
continue potassium chloride 85mq twice daily.

## 2022-02-11 NOTE — Assessment & Plan Note (Signed)
Recommend patient to continue utilize Imodium and Lomotil. Continue twice a week  hydration session x 2 weeks

## 2022-02-11 NOTE — Assessment & Plan Note (Signed)
Likely due to taxane treatments. Continue to apply moisturizers

## 2022-02-11 NOTE — Assessment & Plan Note (Signed)
Treatment plan as listed above. 

## 2022-02-11 NOTE — Assessment & Plan Note (Signed)
Recommend flonase nasal spray.  Also azelastine spray

## 2022-02-11 NOTE — Assessment & Plan Note (Signed)
IV magnesium PRN if Mag <1.7 Continue supportive care. Continue oral Magnesium

## 2022-02-11 NOTE — Patient Instructions (Signed)
[facility] AT [place]  Discharge Instructions: Thank you for choosing [facility] to provide your oncology and hematology care.  If you have a lab appointment with the [facility], please go directly to the [facility] and check in at the registration area.  Wear comfortable clothing and clothing appropriate for easy access to any Portacath or PICC line.   We strive to give you quality time with your provider. You may need to reschedule your appointment if you arrive late (15 or more minutes).  Arriving late affects you and other patients whose appointments are after yours.  Also, if you miss three or more appointments without notifying the office, you may be dismissed from the clinic at the provider's discretion.      For prescription refill requests, have your pharmacy contact our office and allow 72 hours for refills to be completed.    Today you received the following chemotherapy and/or immunotherapy agents Kanjinti, Perjeta, Taxotere, Paraplatin   To help prevent nausea and vomiting after your treatment, we encourage you to take your nausea medication as directed.  BELOW ARE SYMPTOMS THAT SHOULD BE REPORTED IMMEDIATELY: *FEVER GREATER THAN 100.4 F (38 C) OR HIGHER *CHILLS OR SWEATING *NAUSEA AND VOMITING THAT IS NOT CONTROLLED WITH YOUR NAUSEA MEDICATION *UNUSUAL SHORTNESS OF BREATH *UNUSUAL BRUISING OR BLEEDING *URINARY PROBLEMS (pain or burning when urinating, or frequent urination) *BOWEL PROBLEMS (unusual diarrhea, constipation, pain near the anus) TENDERNESS IN MOUTH AND THROAT WITH OR WITHOUT PRESENCE OF ULCERS (sore throat, sores in mouth, or a toothache) UNUSUAL RASH, SWELLING OR PAIN  UNUSUAL VAGINAL DISCHARGE OR ITCHING   Items with * indicate a potential emergency and should be followed up as soon as possible or go to the Emergency Department if any problems should occur.  Please show the CHEMOTHERAPY ALERT CARD or IMMUNOTHERAPY  ALERT CARD at check-in to the Emergency Department and triage nurse.  Should you have questions after your visit or need to cancel or reschedule your appointment, please contact [facility]CER [place] AT [place]  [phone] and follow the prompts.  Office hours are 8:00 a.m. to 4:30 p.m. Monday - Friday. Please note that voicemails left after 4:00 p.m. may not be returned until the following business day.  We are closed weekends and major holidays. You have access to a nurse at all times for urgent questions. Please call the main number to the clinic [phone] and follow the prompts.  For any non-urgent questions, you may also contact your provider using MyChart. We now offer e-Visits for anyone 68 and older to request care online for non-urgent symptoms. For details visit [email or web address].   Also download the MyChart app! Go to the app store, search "MyChart", open the app, select [facility], and log in with your MyChart username and password.

## 2022-02-11 NOTE — Assessment & Plan Note (Signed)
hemoglobin has decreased as expected.  Monitor counts.

## 2022-02-11 NOTE — Progress Notes (Signed)
Pt here for follow up. Pt reports she has been having some nausea and diarrhea.

## 2022-02-11 NOTE — Progress Notes (Signed)
Hematology/Oncology Progress note [phone] [phone]         Patient Care Team: [place] as PCP - [place], [place], RN as Oncology Nurse Navigator Earlie Server, MD as Consulting Physician (Oncology)  REFERRING PROVIDER: Earlie Server, MD   ASSESSMENT & PLAN:   Cancer Staging  Breast cancer [facility]) Staging form: Breast, AJCC 8th Edition - Clinical stage from 08/21/2021: Stage IIIB (cT4b, cN3c, cM0, G3, ER-, PR-, HER2+) - Signed by Earlie Server, MD on 09/12/2021  Breast cancer [facility]) Left breast cancer, cT4b N3 Mx, ER/PR-, HER2 + Overall she tolerates chemotherapy except diarrhea. Clinically left breast mass has responded to treatment. Labs are reviewed and discussed with patient. Echo showed stable LVEF Proceed with chemotherapy cycle 6 TCHP, D3 Udenyca  Clinically breast mass is no longer palpable. Repeat left diagnostic mammogram.  She will need to be re-evaluated by Dr. Peyton Najjar for surgery.   Hypomagnesemia IV magnesium PRN if Mag <1.7 Continue supportive care. Continue oral Magnesium   Hypokalemia continue potassium chloride 34mq twice daily.   Anemia due to antineoplastic chemotherapy hemoglobin has decreased as expected.  Monitor counts.   Chemotherapy induced diarrhea Recommend patient to continue utilize Imodium and Lomotil. Continue twice a week  hydration session x 2 weeks   Encounter for antineoplastic chemotherapy Treatment plan as listed above  Palmar plantar erythrodysesthesia Likely due to taxane treatments. Continue to apply moisturizers   Chronic sinusitis Recommend flonase nasal spray.  Also azelastine spray  Orders Placed This Encounter  Procedures   MM DIAG BREAST TOMO BILATERAL    Standing Status:   Future    Standing Expiration Date:   02/12/2023    Order Specific Question:   Reason for Exam (SYMPTOM  OR DIAGNOSIS REQUIRED)    Answer:   breast cancer    Order Specific Question:   Is the patient pregnant?     Answer:   No    Order Specific Question:   Preferred imaging location?    Answer:   [facility]   [place]    Standing Status:   Future    Standing Expiration Date:   02/12/2023    Order Specific Question:   Reason for Exam (SYMPTOM  OR DIAGNOSIS REQUIRED)    Answer:   breast cancer    Order Specific Question:   Preferred imaging location?    Answer:   [facility]   [place]    Standing Status:   Future    Standing Expiration Date:   02/12/2023    Order Specific Question:   Reason for Exam (SYMPTOM  OR DIAGNOSIS REQUIRED)    Answer:   breast cancer    Order Specific Question:   Preferred imaging location?    Answer:   [facility]   Magnesium    Standing Status:   Future    Number of Occurrences:   1    Standing Expiration Date:   02/12/2023   Magnesium    Standing Status:   Future    Standing Expiration Date:   02/11/2023   CBC with Differential/Platelet    Standing Status:   Future    Standing Expiration Date:   11/91/4782  Basic metabolic panel    Standing Status:   Future    Standing Expiration Date:   02/12/2023   Magnesium    Standing Status:   Future    Standing Expiration Date:   02/12/2023  CBC with Differential/Platelet    Standing Status:   Future    Standing Expiration Date:   02/12/2023   Comprehensive metabolic panel    Standing Status:   Future    Standing Expiration Date:   02/12/2023   Magnesium    Standing Status:   Future    Standing Expiration Date:   4/69/6295   Basic metabolic panel    Standing Status:   Future    Standing Expiration Date:   02/12/2023   Magnesium    Standing Status:   Future    Standing Expiration Date:   2/84/1324   Basic metabolic panel    Standing Status:   Future    Standing Expiration Date:   02/12/2023   Magnesium    Standing Status:   Future    Standing Expiration Date:   02/12/2023   Sample to Blood Bank    Standing Status:   Future    Standing  Expiration Date:   02/12/2023   Sample to Blood Bank    Standing Status:   Future    Standing Expiration Date:   02/12/2023     All questions were answered. The patient knows to call the clinic with any problems questions or concerns.  Return of visit:  Per LOS  Earlie Server, MD, PhD [facility] Hematology Oncology 02/11/2022     CHIEF COMPLAINTS/REASON FOR VISIT:  Follow-up for breast cancer  HISTORY OF PRESENTING ILLNESS:  Vanessa Fisher is a  62 y.o.  female with PMH listed below who was referred to me for evaluation of  left breast cancer SUMMARY OF ONCOLOGIC HISTORY: Oncology History  Breast cancer ([place])  07/31/2021 Imaging   Bilateral diagnostic mammogram and US showed 5 centimeter LEFT breast mass associated with pleomorphic calcifications is suspicious for invasive ductal carcinoma.At least 4 LEFT axillary lymph nodes with abnormal morphology   08/21/2021 Cancer Staging   Staging form: Breast, AJCC 8th Edition - Clinical: Stage IIIB (cT4b, cN3c, cM0, G3, ER-, PR-, HER2+) - Signed by Earlie Server, MD on 08/28/2021 Histologic grading system: 3 grade system  -08/21/21 left breast ultrasound-guided biopsy showed invasive mammary carcinoma, grade 3, ER/PR negative, HER2 positive.  Left axillary lymph node biopsy positive for macro metastatic mammary carcinoma, 8 mm in greatest extent.   08/30/2021 Imaging   MRI brain w wo contrast -No metastatic disease or acute intracranial abnormality. Essentially normal for age MRI appearance of the brain.    09/03/2021 Echocardiogram   1. Left ventricular ejection fraction, by estimation, is 55 to 60%. Left ventricular ejection fraction by 3D volume is 58 %. The left ventricle has normal function. The left ventricle has no regional wall motion abnormalities. There is mild left  ventricular hypertrophy. Left ventricular diastolic parameters were normal.  2. Right ventricular systolic function is normal. The right ventricular size is normal.  3.  The mitral valve is normal in structure. No evidence of mitral valve regurgitation.  4. The aortic valve was not well visualized. Aortic valve regurgitation is not visualized   09/10/2021 Imaging   PET scan showed Large hypermetabolic left breast mass and diffuse skin thickening,consistent with primary breast carcinoma. Hypermetabolic lymphadenopathy in the left axilla, left subpectoral region, and left supraclavicular region, consistent with metastatic disease. No evidence of metastatic disease within the abdomen or pelvis   09/12/2021 -  Chemotherapy   Patient is on Treatment Plan : BREAST  Docetaxel + Carboplatin + Trastuzumab + Pertuzumab  (TCHP) q21d  Genetic Testing   Negative genetic testing. No pathogenic variants identified on the [facility] Common Hereditary Cancers+RNA panel. The report date is 10/26/2021.  The Common Hereditary Cancers Panel + RNA offered by [facility] includes sequencing and/or deletion duplication testing of the following 47 genes: APC, ATM, AXIN2, BARD1, BMPR1A, BRCA1, BRCA2, BRIP1, CDH1, CDKN2A (p14ARF), CDKN2A (p16INK4a), CKD4, CHEK2, CTNNA1, DICER1, EPCAM (Deletion/duplication testing only), GREM1 (promoter region deletion/duplication testing only), KIT, MEN1, MLH1, MSH2, MSH3, MSH6, MUTYH, NBN, NF1, NHTL1, PALB2, PDGFRA, PMS2, POLD1, POLE, PTEN, RAD50, RAD51C, RAD51D, SDHB, SDHC, SDHD, SMAD4, SMARCA4. STK11, TP53, TSC1, TSC2, and VHL.  The following genes were evaluated for sequence changes only: SDHA and HOXB13 c.251G>A variant only.   02/09/2022 Echocardiogram   LVEF 55 to 60%      INTERVAL HISTORY AALIYHA MUMFORD is a 62 y.o. female who has above history reviewed by me today presents for follow up visit for Triple positive breast cancer treatment.  Currently she is on neoadjuvant TCHP with GCSF support, overall she tolerates moderate difficulties. chemotherapy induced diarrhea, improved after using lomotil/imodium C diff negative.  No diarrhea today.   Chronic nasal pressure.  + insomnia, due to shift work .  MEDICAL HISTORY:  Past Medical History:  Diagnosis Date   Anemia    Anxiety    Arthritis    Asthma    WELL CONTROLLED   Bile acid malabsorption syndrome    Bradycardia    HAD AN ISSUE WITH THIS IN 2016-NO PROBLEMS SINCE   Breast cancer, left breast ([place]) 08/2021   Carpal tunnel syndrome on right    Depression    Diabetes mellitus without complication (HCC)    Diverticulitis    Gastric reflux    GERD (gastroesophageal reflux disease)    Hand pain 05/12/2016   Headache    H/O MIGRAINES   History of kidney stones    H/O   Lumbar radiculitis    Neck pain, chronic    Osteoarthritis of both knees    Panic attacks     SURGICAL HISTORY: Past Surgical History:  Procedure Laterality Date   BREAST BIOPSY Left 08/21/2021   Axilla Bx, Hydromarker, path pending   BREAST BIOPSY Left 08/21/2021   [place] Bx, Ribbon Clip, Path Pending   CARPAL TUNNEL RELEASE Right 12/06/2017   Procedure: CARPAL TUNNEL RELEASE;  Surgeon: Earnestine Leys, MD;  Location: [facility] ORS;  Service: Orthopedics;  Laterality: Right;   COLONOSCOPY WITH PROPOFOL N/A 06/11/2021   Procedure: COLONOSCOPY WITH PROPOFOL;  Surgeon: Lin Landsman, MD;  Location: [facility]COPY;  Service: Gastroenterology;  Laterality: N/A;   DORSAL COMPARTMENT RELEASE Right 12/06/2017   Procedure: RELEASE DORSAL COMPARTMENT (DEQUERVAIN);  Surgeon: Earnestine Leys, MD;  Location: [facility] ORS;  Service: Orthopedics;  Laterality: Right;   ESOPHAGOGASTRODUODENOSCOPY (EGD) WITH PROPOFOL N/A 06/11/2021   Procedure: ESOPHAGOGASTRODUODENOSCOPY (EGD) WITH PROPOFOL;  Surgeon: Lin Landsman, MD;  Location: [facility] ENDOSCOPY;  Service: Gastroenterology;  Laterality: N/A;   PARTIAL HYSTERECTOMY     PORTACATH PLACEMENT N/A 09/24/2021   Procedure: INSERTION PORT-A-CATH;  Surgeon: Herbert Pun, MD;  Location: [facility] ORS;  Service: General;  Laterality: N/A;   TUBAL LIGATION      SOCIAL  HISTORY: Social History   Socioeconomic History   Marital status: Single    Spouse name: Not on file   Number of children: Not on file   Years of education: Not on file   Highest education level: Not on file  Occupational History   Not on file  Tobacco Use  Smoking status: Former    Types: Cigarettes   Smokeless tobacco: Never  Vaping Use   Vaping Use: Never used  Substance and Sexual Activity   Alcohol use: No   Drug use: No   Sexual activity: Not on file  Other Topics Concern   Not on file  Social History Narrative   Lives alone   Social Determinants of Health   Financial Resource Strain: High Risk (08/15/2021)   Overall Financial Resource Strain (CARDIA)    Difficulty of Paying Living Expenses: Very hard  Food Insecurity: Food Insecurity Present (09/09/2021)   Hunger Vital Sign    Worried About Running Out of Food in the Last Year: Often true    Ran Out of Food in the Last Year: Often true  Transportation Needs: No Transportation Needs (08/15/2021)   PRAPARE - Hydrologist (Medical): No    Lack of Transportation (Non-Medical): No  Physical Activity: Inactive (08/15/2021)   Exercise Vital Sign    Days of Exercise per Week: 0 days    Minutes of Exercise per Session: 0 min  Stress: Stress Concern Present (08/15/2021)   [place]    Feeling of Stress : Rather much  Social Connections: Socially Isolated (08/15/2021)   Social Connection and Isolation Panel [NHANES]    Frequency of Communication with Friends and Family: Once a week    Frequency of Social Gatherings with Friends and Family: Once a week    Attends Religious Services: Never    Marine scientist or Organizations: No    Attends Music therapist: Not on file    Marital Status: Never married  Intimate Partner Violence: Not At Risk (08/15/2021)   Humiliation, Afraid, Rape, and Kick questionnaire     Fear of Current or Ex-Partner: No    Emotionally Abused: No    Physically Abused: No    Sexually Abused: No    FAMILY HISTORY: Family History  Problem Relation Age of Onset   Diabetes Mother    Hypertension Mother    Heart failure Mother    Pancreatic cancer Mother 43   Diabetes Father    Hypertension Father    Breast cancer Maternal Aunt    Cervical cancer Maternal Aunt    Lung cancer Son 74    ALLERGIES:  is allergic to bc powder [aspirin-salicylamide-caffeine], doxycycline, gabapentin, hydrocodone-acetaminophen, latex, penicillin g, penicillins, and shellfish allergy.  MEDICATIONS:  Current Outpatient Medications  Medication Sig Dispense Refill   albuterol (VENTOLIN HFA) 108 (90 Base) MCG/ACT inhaler Inhale 2 puffs into the lungs every 6 (six) hours as needed for wheezing or shortness of breath. 18 g 0   Azelastine HCl 137 MCG/SPRAY SOLN Place 1 spray into the nose daily. 30 mL 0   cetirizine (ZYRTEC ALLERGY) 10 MG tablet Take 1 tablet (10 mg total) by mouth daily. 90 tablet 0   dexamethasone (DECADRON) 4 MG tablet Take 2 tablets (8 mg total) by mouth daily. Start the day before Taxotere. Then take daily x 2 days after chemotherapy. 30 tablet 1   diphenoxylate-atropine (LOMOTIL) 2.5-0.025 MG tablet Take 1 tablet by mouth 4 (four) times daily. 60 tablet 1   esomeprazole (NEXIUM) 40 MG capsule Take 1 capsule (40 mg total) by mouth daily at 12 noon. 30 capsule 3   FLOVENT DISKUS 250 MCG/ACT AEPB Inhale 1 puff into the lungs daily.     fluticasone (FLONASE) 50 MCG/ACT nasal spray Place 2  sprays into both nostrils daily. 15.8 mL 0   lidocaine-prilocaine (EMLA) cream Apply 1 Application topically as needed. 30 g 12   lisinopril (ZESTRIL) 10 MG tablet Take 10 mg by mouth daily.     lisinopril-hydrochlorothiazide (ZESTORETIC) 10-12.5 MG tablet Take 1 tablet by mouth daily.     loperamide (IMODIUM) 2 MG capsule Take 1 capsule (2 mg total) by mouth See admin instructions. Initial: 4  mg,the 2 mg every 2 hours (4 mg every 4 hours at night)  maximum: 16 mg/day 90 capsule 1   magic mouthwash (multi-ingredient) oral suspension Swish and spit 5-10 ml by mouth 4 times a day as needed 400 mL 1   magnesium chloride (SLOW-MAG) 64 MG TBEC SR tablet Take 1 tablet (64 mg total) by mouth daily. 30 tablet 3   montelukast (SINGULAIR) 10 MG tablet Take by mouth.     ondansetron (ZOFRAN) 8 MG tablet Take 1 tablet (8 mg total) by mouth 2 (two) times daily as needed (Nausea or vomiting). Start on the third day after chemotherapy. 30 tablet 1   oxyCODONE-acetaminophen (PERCOCET/ROXICET) 5-325 MG tablet Take 1 tablet by mouth every 6 (six) hours as needed for pain.     potassium chloride SA (KLOR-CON M) 20 MEQ tablet Take 1 tablet (20 mEq total) by mouth 2 (two) times daily. 60 tablet 1   prochlorperazine (COMPAZINE) 10 MG tablet Take 1 tablet (10 mg total) by mouth every 6 (six) hours as needed (Nausea or vomiting). 30 tablet 1   zinc gluconate 50 MG tablet Take 50 mg by mouth daily.     albuterol (PROVENTIL) (2.5 MG/3ML) 0.083% nebulizer solution Take 3 mLs (2.5 mg total) by nebulization every 4 (four) hours as needed for wheezing or shortness of breath. (Patient not taking: Reported on 01/27/2022) 75 mL 2   naloxone (NARCAN) nasal spray 4 mg/0.1 mL  (Patient not taking: Reported on 12/17/2021)     No current facility-administered medications for this visit.   Facility-Administered Medications Ordered in Other Visits  Medication Dose Route Frequency Provider Last Rate Last Admin   CARBOplatin (PARAPLATIN) 800 mg in sodium chloride 0.9 % 250 mL chemo infusion  800 mg Intravenous Once Earlie Server, MD       DOCEtaxel (TAXOTERE) 160 mg in sodium chloride 0.9 % 250 mL chemo infusion  75 mg/m2 (Treatment Plan Recorded) Intravenous Once Earlie Server, MD 266 mL/hr at 02/11/22 1222 160 mg at 02/11/22 1222   heparin lock flush 100 unit/mL  500 Units Intracatheter Once PRN Earlie Server, MD       magnesium sulfate  IVPB 2 g 50 mL  2 g Intravenous Once C[place]ton, Sarah M, PA-C        Review of Systems  Constitutional:  Negative for appetite change, chills, fatigue and fever.  HENT:   Negative for hearing loss and voice change.        Nasal congestion, pressure  Eyes:  Negative for eye problems.  Respiratory:  Negative for chest tightness and cough.   Cardiovascular:  Negative for chest pain.  Gastrointestinal:  Negative for abdominal distention, abdominal pain and blood in stool.  Endocrine: Negative for hot flashes.  Genitourinary:  Negative for difficulty urinating and frequency.   Musculoskeletal:  Positive for arthralgias and back pain.  Skin:  Negative for itching and rash.  Neurological:  Negative for extremity weakness.  Hematological:  Negative for adenopathy.  Psychiatric/Behavioral:  Negative for confusion.        Feel stressed.  Left breast mass is not palpable on today's breast examination.   PHYSICAL EXAMINATION: ECOG PERFORMANCE STATUS: 1 - Symptomatic but completely ambulatory Vitals:   02/11/22 0948  BP: 111/65  Pulse: 70  Resp: 18  Temp: 97.7 F (36.5 C)   Filed Weights   02/11/22 0948  Weight: 203 lb 12.8 oz (92.4 kg)   Physical Exam Constitutional:      General: She is not in acute distress.    Appearance: She is not diaphoretic.  HENT:     Head: Normocephalic and atraumatic.     Nose: Nose normal.     Mouth/Throat:     Pharynx: No oropharyngeal exudate.  Eyes:     General: No scleral icterus.    Pupils: Pupils are equal, round, and reactive to light.  Cardiovascular:     Rate and Rhythm: Normal rate and regular rhythm.     Heart sounds: No murmur heard. Pulmonary:     Effort: Pulmonary effort is normal. No respiratory distress.     Breath sounds: No rales.  Chest:     Chest wall: No tenderness.  Abdominal:     General: There is no distension.     Palpations: Abdomen is soft.     Tenderness: There is no abdominal tenderness.  Musculoskeletal:         General: Normal range of motion.     Cervical back: Normal range of motion and neck supple.  Skin:    General: Skin is warm and dry.     Findings: No erythema.     Comments: Bilateral finger tip erythematous with small red papules and some desquamation  Neurological:     Mental Status: She is alert and oriented to person, place, and time.     Cranial Nerves: No cranial nerve deficit.     Motor: No abnormal muscle tone.     Coordination: Coordination normal.  Psychiatric:        Mood and Affect: Affect normal.   Left breast mass breast creased in size.  LABORATORY DATA:  I have reviewed the data as listed    Latest Ref Rng & Units 02/11/2022    9:28 AM 02/09/2022    1:23 PM 02/06/2022    1:57 PM  CBC  WBC 4.0 - 10.5 K/uL 5.9  4.3  5.7   Hemoglobin 12.0 - 15.0 g/dL 8.3  8.0  8.4   Hematocrit 36.0 - 46.0 % 25.6  25.0  25.4   Platelets 150 - 400 K/uL 186  122  95       Latest Ref Rng & Units 02/11/2022    9:28 AM 02/09/2022    1:23 PM 02/06/2022    1:19 PM  CMP  Glucose 70 - 99 mg/dL 93  98  111   BUN 6 - 20 mg/dL '16  13  9   '$ Creatinine 0.44 - 1.00 mg/dL 0.87  0.80  0.78   Sodium 135 - 145 mmol/L 139  138  141   Potassium 3.5 - 5.1 mmol/L 4.5  4.2  3.3   Chloride 98 - 111 mmol/L 108  107  106   CO2 22 - 32 mmol/L '25  25  25   '$ Calcium 8.9 - 10.3 mg/dL 8.8  8.5  7.8   Total Protein 6.5 - 8.1 g/dL 6.5  5.9    Total Bilirubin 0.3 - 1.2 mg/dL 0.2  0.2    Alkaline Phos 38 - 126 U/L 77  76    AST 15 -  41 U/L 24  21    ALT 0 - 44 U/L 19  18        RADIOGRAPHIC STUDIES: I have personally reviewed the radiological images as listed and agreed with the findings in the report. ECHOCARDIOGRAM COMPLETE  Result Date: 02/09/2022    ECHOCARDIOGRAM REPORT   Patient Name:   Vanessa Fisher Date of Exam: 02/09/2022 Medical Rec #:  [id number]            Height:       66.0 in Accession #:    [id number]           Weight:       191.5 lb Date of Birth:  19-Jul-1960            BSA:          1.963 m  Patient Age:    39 years             BP:           102/62 mmHg Patient Gender: F                    HR:           73 bpm. Exam Location:  [facility] Procedure: 2D Echo, Cardiac Doppler, Color Doppler and Strain Analysis Indications:     Chemo Z09  History:         Patient has prior history of Echocardiogram examinations, most                  recent 09/03/2021. Risk Factors:Diabetes. Breast cancer.  Sonographer:     Sherrie Sport Referring Phys:  [id number] [place] Ovid Witman Diagnosing Phys: Kathlyn Sacramento MD  Sonographer Comments: Global longitudinal strain was attempted. IMPRESSIONS  1. Left ventricular ejection fraction, by estimation, is 55 to 60%. The left ventricle has normal function. The left ventricle has no regional wall motion abnormalities. Left ventricular diastolic parameters were normal. The average left ventricular global longitudinal strain is -19.1 %. The global longitudinal strain is normal.  2. Right ventricular systolic function is normal. The right ventricular size is normal. There is normal pulmonary artery systolic pressure.  3. The mitral valve is normal in structure. Trivial mitral valve regurgitation. No evidence of mitral stenosis.  4. The aortic valve is normal in structure. Aortic valve regurgitation is not visualized. No aortic stenosis is present.  5. The inferior vena cava is normal in size with greater than 50% respiratory variability, suggesting right atrial pressure of 3 mmHg. FINDINGS  Left Ventricle: Left ventricular ejection fraction, by estimation, is 55 to 60%. The left ventricle has normal function. The left ventricle has no regional wall motion abnormalities. The average left ventricular global longitudinal strain is -19.1 %. The global longitudinal strain is normal. The left ventricular internal cavity size was normal in size. There is no left ventricular hypertrophy. Left ventricular diastolic parameters were normal. Right Ventricle: The right ventricular size is normal. No increase in right  ventricular wall thickness. Right ventricular systolic function is normal. There is normal pulmonary artery systolic pressure. The tricuspid regurgitant velocity is 2.57 m/s, and  with an assumed right atrial pressure of 3 mmHg, the estimated right ventricular systolic pressure is 06.2 mmHg. Left Atrium: Left atrial size was normal in size. Right Atrium: Right atrial size was normal in size. Pericardium: There is no evidence of pericardial effusion. Mitral Valve: The mitral valve is normal in structure. Trivial mitral valve regurgitation. No evidence of mitral valve  stenosis. Tricuspid Valve: The tricuspid valve is normal in structure. Tricuspid valve regurgitation is trivial. No evidence of tricuspid stenosis. Aortic Valve: The aortic valve is normal in structure. Aortic valve regurgitation is not visualized. No aortic stenosis is present. Aortic valve mean gradient measures 4.0 mmHg. Aortic valve peak gradient measures 8.3 mmHg. Aortic valve area, by VTI measures 2.20 cm. Pulmonic Valve: The pulmonic valve was normal in structure. Pulmonic valve regurgitation is not visualized. No evidence of pulmonic stenosis. Aorta: The aortic root is normal in size and structure. Venous: The inferior vena cava is normal in size with greater than 50% respiratory variability, suggesting right atrial pressure of 3 mmHg. IAS/Shunts: No atrial level shunt detected by color flow Doppler.  LEFT VENTRICLE PLAX 2D LVIDd:         4.40 cm   Diastology LVIDs:         2.80 cm   LV e' medial:    13.50 cm/s LV PW:         1.00 cm   LV E/e' medial:  7.5 LV IVS:        1.10 cm   LV e' lateral:   14.10 cm/s LVOT diam:     2.00 cm   LV E/e' lateral: 7.2 LV SV:         65 LV SV Index:   33        2D Longitudinal Strain LVOT Area:     3.14 cm  2D Strain GLS Avg:     -19.1 %  RIGHT VENTRICLE RV Basal diam:  3.30 cm RV Mid diam:    2.80 cm LEFT ATRIUM           Index        RIGHT ATRIUM          Index LA diam:      3.80 cm 1.94 cm/m   RA Area:      9.71 cm LA Vol (A2C): 46.1 ml 23.48 ml/m  RA Volume:   18.70 ml 9.53 ml/m LA Vol (A4C): 16.9 ml 8.61 ml/m  AORTIC VALVE AV Area (Vmax):    2.38 cm AV Area (Vmean):   2.30 cm AV Area (VTI):     2.20 cm AV Vmax:           144.00 cm/s AV Vmean:          96.900 cm/s AV VTI:            0.294 m AV Peak Grad:      8.3 mmHg AV Mean Grad:      4.0 mmHg LVOT Vmax:         109.00 cm/s LVOT Vmean:        71.000 cm/s LVOT VTI:          0.206 m LVOT/AV VTI ratio: 0.70  AORTA Ao Root diam: 2.50 cm MITRAL VALVE                TRICUSPID VALVE MV Area (PHT): 3.33 cm     TR Peak grad:   26.4 mmHg MV Decel Time: 228 msec     TR Vmax:        257.00 cm/s MV E velocity: 101.00 cm/s MV A velocity: 61.30 cm/s   SHUNTS MV E/A ratio:  1.65         Systemic VTI:  0.21 m  Systemic Diam: 2.00 cm Kathlyn Sacramento MD Electronically signed by Kathlyn Sacramento MD Signature Date/Time: 02/09/2022/2:07:19 PM    Final    DG Chest 2 View  Result Date: 01/27/2022 CLINICAL DATA:  Cough, congestion EXAM: CHEST - 2 VIEW COMPARISON:  09/24/2021 FINDINGS: Right-sided Port-A-Cath with the tip projecting over the SVC. No focal consolidation. No pleural effusion or pneumothorax. Heart and mediastinal contours are unremarkable. No acute osseous abnormality. IMPRESSION: No active cardiopulmonary disease. Electronically Signed   By: Kathreen Devoid M.D.   On: 01/27/2022 10:32

## 2022-02-11 NOTE — Assessment & Plan Note (Addendum)
Left breast cancer, cT4b N3 Mx, ER/PR-, HER2 + Overall she tolerates chemotherapy except diarrhea. Clinically left breast mass has responded to treatment. Labs are reviewed and discussed with patient. Echo showed stable LVEF Proceed with chemotherapy cycle 6 TCHP, D3 Udenyca  Clinically breast mass is no longer palpable. Repeat left diagnostic mammogram.  She will need to be re-evaluated by Dr. Peyton Najjar for surgery.

## 2022-02-12 ENCOUNTER — Other Ambulatory Visit: Payer: Medicare HMO

## 2022-02-12 ENCOUNTER — Ambulatory Visit: Payer: Medicare HMO

## 2022-02-12 ENCOUNTER — Other Ambulatory Visit: Payer: Self-pay

## 2022-02-12 ENCOUNTER — Ambulatory Visit: Payer: Medicare HMO | Admitting: Oncology

## 2022-02-12 ENCOUNTER — Telehealth: Payer: Self-pay | Admitting: Oncology

## 2022-02-12 DIAGNOSIS — Z171 Estrogen receptor negative status [ER-]: Secondary | ICD-10-CM

## 2022-02-12 DIAGNOSIS — Z09 Encounter for follow-up examination after completed treatment for conditions other than malignant neoplasm: Secondary | ICD-10-CM

## 2022-02-12 NOTE — Telephone Encounter (Signed)
Unable to lvm to notify pt that Mamm has been scheduled and appt info due to VM not being set up.

## 2022-02-13 ENCOUNTER — Inpatient Hospital Stay: Payer: Medicare HMO

## 2022-02-13 ENCOUNTER — Ambulatory Visit: Payer: Medicare HMO

## 2022-02-13 ENCOUNTER — Other Ambulatory Visit: Payer: Medicare HMO

## 2022-02-13 VITALS — BP 113/65 | HR 89 | Temp 97.1°F | Resp 20

## 2022-02-13 DIAGNOSIS — Z5112 Encounter for antineoplastic immunotherapy: Secondary | ICD-10-CM | POA: Diagnosis not present

## 2022-02-13 DIAGNOSIS — K521 Toxic gastroenteritis and colitis: Secondary | ICD-10-CM

## 2022-02-13 DIAGNOSIS — T451X5A Adverse effect of antineoplastic and immunosuppressive drugs, initial encounter: Secondary | ICD-10-CM

## 2022-02-13 DIAGNOSIS — Z171 Estrogen receptor negative status [ER-]: Secondary | ICD-10-CM

## 2022-02-13 DIAGNOSIS — C50011 Malignant neoplasm of nipple and areola, right female breast: Secondary | ICD-10-CM

## 2022-02-13 LAB — MAGNESIUM: Magnesium: 1.5 mg/dL — ABNORMAL LOW (ref 1.7–2.4)

## 2022-02-13 MED ORDER — SODIUM CHLORIDE 0.9 % IV SOLN
INTRAVENOUS | Status: DC
  Filled 2022-02-13 (×2): qty 250

## 2022-02-13 MED ORDER — HEPARIN SOD (PORK) LOCK FLUSH 100 UNIT/ML IV SOLN
500.0000 [IU] | Freq: Once | INTRAVENOUS | Status: AC
  Administered 2022-02-13: 500 [IU] via INTRAVENOUS
  Filled 2022-02-13: qty 5

## 2022-02-13 MED ORDER — MAGNESIUM SULFATE 2 GM/50ML IV SOLN
2.0000 g | Freq: Once | INTRAVENOUS | Status: AC
  Administered 2022-02-13: 2 g via INTRAVENOUS
  Filled 2022-02-13: qty 50

## 2022-02-13 MED ORDER — SODIUM CHLORIDE 0.9% FLUSH
10.0000 mL | Freq: Once | INTRAVENOUS | Status: AC
  Administered 2022-02-13: 10 mL via INTRAVENOUS
  Filled 2022-02-13: qty 10

## 2022-02-13 MED ORDER — PEGFILGRASTIM-CBQV 6 MG/0.6ML ~~LOC~~ SOSY
6.0000 mg | PREFILLED_SYRINGE | Freq: Once | SUBCUTANEOUS | Status: AC
  Administered 2022-02-13: 6 mg via SUBCUTANEOUS
  Filled 2022-02-13: qty 0.6

## 2022-02-16 ENCOUNTER — Other Ambulatory Visit: Payer: Self-pay

## 2022-02-17 ENCOUNTER — Inpatient Hospital Stay: Payer: Medicare HMO

## 2022-02-17 ENCOUNTER — Other Ambulatory Visit: Payer: Self-pay

## 2022-02-17 VITALS — BP 103/65 | HR 87 | Temp 99.3°F | Resp 20

## 2022-02-17 DIAGNOSIS — K521 Toxic gastroenteritis and colitis: Secondary | ICD-10-CM

## 2022-02-17 DIAGNOSIS — C50011 Malignant neoplasm of nipple and areola, right female breast: Secondary | ICD-10-CM

## 2022-02-17 DIAGNOSIS — Z5112 Encounter for antineoplastic immunotherapy: Secondary | ICD-10-CM | POA: Diagnosis not present

## 2022-02-17 LAB — BASIC METABOLIC PANEL
Anion gap: 9 (ref 5–15)
BUN: 18 mg/dL (ref 6–20)
CO2: 24 mmol/L (ref 22–32)
Calcium: 8.7 mg/dL — ABNORMAL LOW (ref 8.9–10.3)
Chloride: 103 mmol/L (ref 98–111)
Creatinine, Ser: 0.84 mg/dL (ref 0.44–1.00)
GFR, Estimated: >60 mL/min (ref >60)
Glucose, Bld: 132 mg/dL — ABNORMAL HIGH (ref 70–99)
Potassium: 3.5 mmol/L (ref 3.5–5.1)
Sodium: 136 mmol/L (ref 135–145)

## 2022-02-17 LAB — CBC WITH DIFFERENTIAL/PLATELET
Abs Immature Granulocytes: 0.07 10*3/uL (ref 0.00–0.07)
Basophils Absolute: 0 10*3/uL (ref 0.0–0.1)
Basophils Relative: 1 %
Eosinophils Absolute: 0 10*3/uL (ref 0.0–0.5)
Eosinophils Relative: 1 %
HCT: 27.5 % — ABNORMAL LOW (ref 36.0–46.0)
Hemoglobin: 8.7 g/dL — ABNORMAL LOW (ref 12.0–15.0)
Immature Granulocytes: 5 %
Lymphocytes Relative: 60 %
Lymphs Abs: 0.9 10*3/uL (ref 0.7–4.0)
MCH: 29 pg (ref 26.0–34.0)
MCHC: 31.6 g/dL (ref 30.0–36.0)
MCV: 91.7 fL (ref 80.0–100.0)
Monocytes Absolute: 0.1 10*3/uL (ref 0.1–1.0)
Monocytes Relative: 6 %
Neutro Abs: 0.4 10*3/uL — CL (ref 1.7–7.7)
Neutrophils Relative %: 27 %
Platelets: 220 10*3/uL (ref 150–400)
RBC: 3 MIL/uL — ABNORMAL LOW (ref 3.87–5.11)
RDW: 16.3 % — ABNORMAL HIGH (ref 11.5–15.5)
Smear Review: NORMAL
WBC: 1.5 10*3/uL — ABNORMAL LOW (ref 4.0–10.5)
nRBC: 0 % (ref 0.0–0.2)

## 2022-02-17 LAB — MAGNESIUM: Magnesium: 1.6 mg/dL — ABNORMAL LOW (ref 1.7–2.4)

## 2022-02-17 LAB — SAMPLE TO BLOOD BANK

## 2022-02-17 MED ORDER — HEPARIN SOD (PORK) LOCK FLUSH 100 UNIT/ML IV SOLN
500.0000 [IU] | Freq: Once | INTRAVENOUS | Status: AC | PRN
  Administered 2022-02-17: 500 [IU]
  Filled 2022-02-17: qty 5

## 2022-02-17 MED ORDER — SODIUM CHLORIDE 0.9 % IV SOLN
Freq: Once | INTRAVENOUS | Status: AC
  Filled 2022-02-17: qty 250

## 2022-02-17 MED ORDER — MAGNESIUM SULFATE 2 GM/50ML IV SOLN
2.0000 g | Freq: Once | INTRAVENOUS | Status: AC
  Administered 2022-02-17: 2 g via INTRAVENOUS
  Filled 2022-02-17: qty 50

## 2022-02-17 MED ORDER — SODIUM CHLORIDE 0.9% FLUSH
10.0000 mL | Freq: Once | INTRAVENOUS | Status: AC | PRN
  Administered 2022-02-17: 10 mL
  Filled 2022-02-17: qty 10

## 2022-02-20 ENCOUNTER — Other Ambulatory Visit: Payer: Self-pay

## 2022-02-20 ENCOUNTER — Inpatient Hospital Stay (HOSPITAL_BASED_OUTPATIENT_CLINIC_OR_DEPARTMENT_OTHER): Payer: Medicare HMO | Admitting: Medical Oncology

## 2022-02-20 ENCOUNTER — Encounter: Payer: Self-pay | Admitting: Medical Oncology

## 2022-02-20 ENCOUNTER — Inpatient Hospital Stay: Payer: Medicare HMO

## 2022-02-20 VITALS — BP 105/66 | HR 78 | Temp 98.1°F | Resp 20

## 2022-02-20 VITALS — BP 109/49 | HR 80 | Temp 97.6°F | Resp 20 | Ht 66.0 in | Wt 202.0 lb

## 2022-02-20 DIAGNOSIS — C50011 Malignant neoplasm of nipple and areola, right female breast: Secondary | ICD-10-CM | POA: Diagnosis not present

## 2022-02-20 DIAGNOSIS — K521 Toxic gastroenteritis and colitis: Secondary | ICD-10-CM

## 2022-02-20 DIAGNOSIS — T451X5A Adverse effect of antineoplastic and immunosuppressive drugs, initial encounter: Secondary | ICD-10-CM

## 2022-02-20 DIAGNOSIS — Z171 Estrogen receptor negative status [ER-]: Secondary | ICD-10-CM

## 2022-02-20 DIAGNOSIS — L271 Localized skin eruption due to drugs and medicaments taken internally: Secondary | ICD-10-CM | POA: Diagnosis not present

## 2022-02-20 DIAGNOSIS — C50812 Malignant neoplasm of overlapping sites of left female breast: Secondary | ICD-10-CM

## 2022-02-20 DIAGNOSIS — Z5112 Encounter for antineoplastic immunotherapy: Secondary | ICD-10-CM | POA: Diagnosis not present

## 2022-02-20 LAB — CBC WITH DIFFERENTIAL/PLATELET
Abs Immature Granulocytes: 0.67 10*3/uL — ABNORMAL HIGH (ref 0.00–0.07)
Basophils Absolute: 0.1 10*3/uL (ref 0.0–0.1)
Basophils Relative: 1 %
Eosinophils Absolute: 0 10*3/uL (ref 0.0–0.5)
Eosinophils Relative: 0 %
HCT: 25.7 % — ABNORMAL LOW (ref 36.0–46.0)
Hemoglobin: 8.2 g/dL — ABNORMAL LOW (ref 12.0–15.0)
Immature Granulocytes: 9 %
Lymphocytes Relative: 24 %
Lymphs Abs: 1.9 10*3/uL (ref 0.7–4.0)
MCH: 29 pg (ref 26.0–34.0)
MCHC: 31.9 g/dL (ref 30.0–36.0)
MCV: 90.8 fL (ref 80.0–100.0)
Monocytes Absolute: 1.4 10*3/uL — ABNORMAL HIGH (ref 0.1–1.0)
Monocytes Relative: 17 %
Neutro Abs: 3.9 10*3/uL (ref 1.7–7.7)
Neutrophils Relative %: 49 %
Platelets: 219 10*3/uL (ref 150–400)
RBC: 2.83 MIL/uL — ABNORMAL LOW (ref 3.87–5.11)
RDW: 16.2 % — ABNORMAL HIGH (ref 11.5–15.5)
Smear Review: NORMAL
WBC: 7.9 10*3/uL (ref 4.0–10.5)
nRBC: 0 % (ref 0.0–0.2)

## 2022-02-20 LAB — COMPREHENSIVE METABOLIC PANEL
ALT: 18 U/L (ref 0–44)
AST: 22 U/L (ref 15–41)
Albumin: 3.8 g/dL (ref 3.5–5.0)
Alkaline Phosphatase: 100 U/L (ref 38–126)
Anion gap: 7 (ref 5–15)
BUN: 8 mg/dL (ref 6–20)
CO2: 25 mmol/L (ref 22–32)
Calcium: 8.8 mg/dL — ABNORMAL LOW (ref 8.9–10.3)
Chloride: 107 mmol/L (ref 98–111)
Creatinine, Ser: 0.75 mg/dL (ref 0.44–1.00)
GFR, Estimated: >60 mL/min (ref >60)
Glucose, Bld: 109 mg/dL — ABNORMAL HIGH (ref 70–99)
Potassium: 3.6 mmol/L (ref 3.5–5.1)
Sodium: 139 mmol/L (ref 135–145)
Total Bilirubin: 0.3 mg/dL (ref 0.3–1.2)
Total Protein: 7 g/dL (ref 6.5–8.1)

## 2022-02-20 LAB — SAMPLE TO BLOOD BANK

## 2022-02-20 LAB — MAGNESIUM: Magnesium: 1.2 mg/dL — ABNORMAL LOW (ref 1.7–2.4)

## 2022-02-20 MED ORDER — BETAMETHASONE DIPROPIONATE 0.05 % EX CREA: TOPICAL_CREAM | Freq: Two times a day (BID) | CUTANEOUS | 0 refills | Status: DC

## 2022-02-20 MED ORDER — SODIUM CHLORIDE 0.9 % IV SOLN
Freq: Once | INTRAVENOUS | Status: AC
  Filled 2022-02-20: qty 250

## 2022-02-20 MED ORDER — DOXYCYCLINE MONOHYDRATE 100 MG PO CAPS: 100.0000 mg | ORAL_CAPSULE | Freq: Two times a day (BID) | ORAL | 0 refills | Status: AC

## 2022-02-20 MED ORDER — SODIUM CHLORIDE 0.9% FLUSH
10.0000 mL | Freq: Once | INTRAVENOUS | Status: AC
  Administered 2022-02-20: 10 mL via INTRAVENOUS
  Filled 2022-02-20: qty 10

## 2022-02-20 MED ORDER — MAGNESIUM SULFATE 2 GM/50ML IV SOLN: 2.0000 g | Freq: Once | INTRAVENOUS | Status: DC

## 2022-02-20 MED ORDER — MAGNESIUM SULFATE 4 GM/100ML IV SOLN
4.0000 g | Freq: Once | INTRAVENOUS | Status: AC
  Administered 2022-02-20: 4 g via INTRAVENOUS
  Filled 2022-02-20: qty 100

## 2022-02-20 MED ORDER — HEPARIN SOD (PORK) LOCK FLUSH 100 UNIT/ML IV SOLN
500.0000 [IU] | Freq: Once | INTRAVENOUS | Status: AC
  Administered 2022-02-20: 500 [IU] via INTRAVENOUS
  Filled 2022-02-20: qty 5

## 2022-02-20 MED ORDER — SODIUM CHLORIDE 0.9 % IV SOLN: 6.0000 g | Freq: Once | INTRAVENOUS | Status: DC

## 2022-02-20 NOTE — Addendum Note (Signed)
Addended by: Nelwyn Salisbury on: 02/20/2022 10:28 AM   Modules accepted: Orders

## 2022-02-20 NOTE — Progress Notes (Addendum)
Symptom Management [place] at [facility] [phone] [phone]  Patient Care Team: [place] as PCP - General Daiva Huge, RN as Oncology Nurse Navigator Earlie Server, MD as Consulting Physician (Oncology)   Name of the patient: Vanessa Fisher  [id number]  09-25-1960   Oncological History: Breast Cancer of Bilateral Breasts- Stage IIIB (cT4b, cN3c, cM0, G3, ER-, PR-, HER2+)   Current Treatment: neoadjuvant Docetaxel + TCHP with GCSF Ellen Henri) Last cycle (Cycle 6) was on 02/11/2022 with Udenyca support on 02/13/2022. Started on 09/12/2021.   Date of visit: 02/20/22  Reason for Consult: Vanessa Fisher is a 62 y.o. female who presents today for evaluation of redness of hands:  This symptom started about 2 days ago. Occurs on the dorsal sides of her hands and spreads down slightly to her wrists. She does not feel like this is spreading or worsening. She denies any blistering, areas of skin that has fallen off or fever. Moderately painful. Has been using neosporin and vaseline which has helped. She has had some diarrhea which has resolved. Two days ago when symptoms started she had a brief episode where she felt chilled and fatigues but this resolved with a nap. She is eating and drinking but reports that foods do not taste great. No vomiting. Had about 2 days worth of occasional nosebleeds after taking sinus medication but this has resolved.    Denies any neurologic complaints. Denies recent fevers or illnesses. Denies any easy bleeding or bruising. Reports good appetite and denies weight loss. Denies chest pain. Denies any nausea, vomiting, constipation. Denies urinary complaints. Patient offers no further specific complaints today.  Wt Readings from Last 3 Encounters:  02/20/22 202 lb (91.6 kg)  02/11/22 203 lb 12.8 oz (92.4 kg)  02/09/22 198 lb (89.8 kg)     PAST MEDICAL HISTORY: Past Medical History:   Diagnosis Date   Anemia    Anxiety    Arthritis    Asthma    WELL CONTROLLED   Bile acid malabsorption syndrome    Bradycardia    HAD AN ISSUE WITH THIS IN 2016-NO PROBLEMS SINCE   Breast cancer, left breast ([place]) 08/2021   Carpal tunnel syndrome on right    Depression    Diabetes mellitus without complication (HCC)    Diverticulitis    Gastric reflux    GERD (gastroesophageal reflux disease)    Hand pain 05/12/2016   Headache    H/O MIGRAINES   History of kidney stones    H/O   Lumbar radiculitis    Neck pain, chronic    Osteoarthritis of both knees    Panic attacks     PAST SURGICAL HISTORY:  Past Surgical History:  Procedure Laterality Date   BREAST BIOPSY Left 08/21/2021   Axilla Bx, Hydromarker, path pending   BREAST BIOPSY Left 08/21/2021   [place] Bx, Ribbon Clip, Path Pending   CARPAL TUNNEL RELEASE Right 12/06/2017   Procedure: CARPAL TUNNEL RELEASE;  Surgeon: Earnestine Leys, MD;  Location: [facility] ORS;  Service: Orthopedics;  Laterality: Right;   COLONOSCOPY WITH PROPOFOL N/A 06/11/2021   Procedure: COLONOSCOPY WITH PROPOFOL;  Surgeon: Lin Landsman, MD;  Location: [facility] ENDOSCOPY;  Service: Gastroenterology;  Laterality: N/A;   DORSAL COMPARTMENT RELEASE Right 12/06/2017   Procedure: RELEASE DORSAL COMPARTMENT (DEQUERVAIN);  Surgeon: Earnestine Leys, MD;  Location: [facility] ORS;  Service: Orthopedics;  Laterality: Right;   ESOPHAGOGASTRODUODENOSCOPY (EGD) WITH PROPOFOL N/A 06/11/2021   Procedure: ESOPHAGOGASTRODUODENOSCOPY (EGD) WITH  PROPOFOL;  Surgeon: Lin Landsman, MD;  Location: [facility] ENDOSCOPY;  Service: Gastroenterology;  Laterality: N/A;   PARTIAL HYSTERECTOMY     PORTACATH PLACEMENT N/A 09/24/2021   Procedure: INSERTION PORT-A-CATH;  Surgeon: Herbert Pun, MD;  Location: [facility] ORS;  Service: General;  Laterality: N/A;   TUBAL LIGATION      HEMATOLOGY/ONCOLOGY HISTORY:  Oncology History  Breast cancer ([place])  07/31/2021 Imaging   Bilateral  diagnostic mammogram and US showed 5 centimeter LEFT breast mass associated with pleomorphic calcifications is suspicious for invasive ductal carcinoma.At least 4 LEFT axillary lymph nodes with abnormal morphology   08/21/2021 Cancer Staging   Staging form: Breast, AJCC 8th Edition - Clinical: Stage IIIB (cT4b, cN3c, cM0, G3, ER-, PR-, HER2+) - Signed by Earlie Server, MD on 08/28/2021 Histologic grading system: 3 grade system  -08/21/21 left breast ultrasound-guided biopsy showed invasive mammary carcinoma, grade 3, ER/PR negative, HER2 positive.  Left axillary lymph node biopsy positive for macro metastatic mammary carcinoma, 8 mm in greatest extent.   08/30/2021 Imaging   MRI brain w wo contrast -No metastatic disease or acute intracranial abnormality. Essentially normal for age MRI appearance of the brain.    09/03/2021 Echocardiogram   1. Left ventricular ejection fraction, by estimation, is 55 to 60%. Left ventricular ejection fraction by 3D volume is 58 %. The left ventricle has normal function. The left ventricle has no regional wall motion abnormalities. There is mild left  ventricular hypertrophy. Left ventricular diastolic parameters were normal.  2. Right ventricular systolic function is normal. The right ventricular size is normal.  3. The mitral valve is normal in structure. No evidence of mitral valve regurgitation.  4. The aortic valve was not well visualized. Aortic valve regurgitation is not visualized   09/10/2021 Imaging   PET scan showed Large hypermetabolic left breast mass and diffuse skin thickening,consistent with primary breast carcinoma. Hypermetabolic lymphadenopathy in the left axilla, left subpectoral region, and left supraclavicular region, consistent with metastatic disease. No evidence of metastatic disease within the abdomen or pelvis   09/12/2021 -  Chemotherapy   Patient is on Treatment Plan : BREAST  Docetaxel + Carboplatin + Trastuzumab + Pertuzumab  (TCHP) q21d        Genetic Testing   Negative genetic testing. No pathogenic variants identified on the [facility] Common Hereditary Cancers+RNA panel. The report date is 10/26/2021.  The Common Hereditary Cancers Panel + RNA offered by [facility] includes sequencing and/or deletion duplication testing of the following 47 genes: APC, ATM, AXIN2, BARD1, BMPR1A, BRCA1, BRCA2, BRIP1, CDH1, CDKN2A (p14ARF), CDKN2A (p16INK4a), CKD4, CHEK2, CTNNA1, DICER1, EPCAM (Deletion/duplication testing only), GREM1 (promoter region deletion/duplication testing only), KIT, MEN1, MLH1, MSH2, MSH3, MSH6, MUTYH, NBN, NF1, NHTL1, PALB2, PDGFRA, PMS2, POLD1, POLE, PTEN, RAD50, RAD51C, RAD51D, SDHB, SDHC, SDHD, SMAD4, SMARCA4. STK11, TP53, TSC1, TSC2, and VHL.  The following genes were evaluated for sequence changes only: SDHA and HOXB13 c.251G>A variant only.   02/09/2022 Echocardiogram   LVEF 55 to 60%      ALLERGIES:  is allergic to bc powder [aspirin-salicylamide-caffeine], doxycycline, gabapentin, hydrocodone-acetaminophen, latex, penicillin g, penicillins, and shellfish allergy.  MEDICATIONS:  Current Outpatient Medications  Medication Sig Dispense Refill   albuterol (VENTOLIN HFA) 108 (90 Base) MCG/ACT inhaler Inhale 2 puffs into the lungs every 6 (six) hours as needed for wheezing or shortness of breath. 18 g 0   Azelastine HCl 137 MCG/SPRAY SOLN Place 1 spray into the nose daily. 30 mL 0   betamethasone dipropionate 0.05 % cream  Apply topically 2 (two) times daily. 30 g 0   cetirizine (ZYRTEC ALLERGY) 10 MG tablet Take 1 tablet (10 mg total) by mouth daily. 90 tablet 0   dexamethasone (DECADRON) 4 MG tablet Take 2 tablets (8 mg total) by mouth daily. Start the day before Taxotere. Then take daily x 2 days after chemotherapy. 30 tablet 1   diphenoxylate-atropine (LOMOTIL) 2.5-0.025 MG tablet Take 1 tablet by mouth 4 (four) times daily. 60 tablet 1   esomeprazole (NEXIUM) 40 MG capsule Take 1 capsule (40 mg total) by mouth daily at  12 noon. 30 capsule 3   FLOVENT DISKUS 250 MCG/ACT AEPB Inhale 1 puff into the lungs daily.     fluticasone (FLONASE) 50 MCG/ACT nasal spray Place 2 sprays into both nostrils daily. 15.8 mL 0   lidocaine-prilocaine (EMLA) cream Apply 1 Application topically as needed. 30 g 12   lisinopril (ZESTRIL) 10 MG tablet Take 10 mg by mouth daily.     loperamide (IMODIUM) 2 MG capsule Take 1 capsule (2 mg total) by mouth See admin instructions. Initial: 4 mg,the 2 mg every 2 hours (4 mg every 4 hours at night)  maximum: 16 mg/day 90 capsule 1   magic mouthwash (multi-ingredient) oral suspension Swish and spit 5-10 ml by mouth 4 times a day as needed 400 mL 1   magnesium chloride (SLOW-MAG) 64 MG TBEC SR tablet Take 1 tablet (64 mg total) by mouth daily. 30 tablet 3   montelukast (SINGULAIR) 10 MG tablet Take by mouth.     ondansetron (ZOFRAN) 8 MG tablet Take 1 tablet (8 mg total) by mouth 2 (two) times daily as needed (Nausea or vomiting). Start on the third day after chemotherapy. 30 tablet 1   oxyCODONE-acetaminophen (PERCOCET/ROXICET) 5-325 MG tablet Take 1 tablet by mouth every 6 (six) hours as needed for pain.     potassium chloride SA (KLOR-CON M) 20 MEQ tablet Take 1 tablet (20 mEq total) by mouth 2 (two) times daily. 60 tablet 1   prochlorperazine (COMPAZINE) 10 MG tablet Take 1 tablet (10 mg total) by mouth every 6 (six) hours as needed (Nausea or vomiting). 30 tablet 1   zinc gluconate 50 MG tablet Take 50 mg by mouth daily.     albuterol (PROVENTIL) (2.5 MG/3ML) 0.083% nebulizer solution Take 3 mLs (2.5 mg total) by nebulization every 4 (four) hours as needed for wheezing or shortness of breath. (Patient not taking: Reported on 01/27/2022) 75 mL 2   naloxone (NARCAN) nasal spray 4 mg/0.1 mL  (Patient not taking: Reported on 12/17/2021)     No current facility-administered medications for this visit.   Facility-Administered Medications Ordered in Other Visits  Medication Dose Route Frequency  Provider Last Rate Last Admin   heparin lock flush 100 unit/mL  500 Units Intravenous Once Earlie Server, MD       magnesium sulfate IVPB 2 g 50 mL  2 g Intravenous Once Dymir Neeson M, PA-C       magnesium sulfate IVPB 4 g 100 mL  4 g Intravenous Once Earlie Server, MD 50 mL/hr at 02/20/22 1014 4 g at 02/20/22 1014   And   magnesium sulfate IVPB 2 g 50 mL  2 g Intravenous Once Earlie Server, MD        VITAL SIGNS: BP (!) 109/49   Pulse 80   Temp 97.6 F (36.4 C) (Tympanic)   Resp 20   Ht '5\' 6"'$  (1.676 m)   Wt 202 lb (91.6 kg)  SpO2 100% Comment: room air  BMI 32.60 kg/m  Filed Weights   02/20/22 0900  Weight: 202 lb (91.6 kg)    Estimated body mass index is 32.6 kg/m as calculated from the following:   Height as of this encounter: '5\' 6"'$  (1.676 m).   Weight as of this encounter: 202 lb (91.6 kg).  LABS: CBC:    Component Value Date/Time   WBC 7.9 02/20/2022 0852   HGB 8.2 (L) 02/20/2022 0852   HGB 11.9 (L) 05/27/2014 2215   HCT 25.7 (L) 02/20/2022 0852   HCT 36.4 05/27/2014 2215   PLT 219 02/20/2022 0852   PLT 293 05/27/2014 2215   MCV 90.8 02/20/2022 0852   MCV 81 05/27/2014 2215   NEUTROABS 3.9 02/20/2022 0852   NEUTROABS 5.4 05/27/2014 2215   LYMPHSABS 1.9 02/20/2022 0852   LYMPHSABS 3.0 05/27/2014 2215   MONOABS 1.4 (H) 02/20/2022 0852   MONOABS 0.6 05/27/2014 2215   EOSABS 0.0 02/20/2022 0852   EOSABS 0.0 05/27/2014 2215   BASOSABS 0.1 02/20/2022 0852   BASOSABS 0.0 05/27/2014 2215   Comprehensive Metabolic Panel:    Component Value Date/Time   NA 139 02/20/2022 0852   NA 143 07/08/2016 1259   NA 139 05/27/2014 2215   K 3.6 02/20/2022 0852   K 3.5 05/27/2014 2215   CL 107 02/20/2022 0852   CL 105 05/27/2014 2215   CO2 25 02/20/2022 0852   CO2 26 05/27/2014 2215   BUN 8 02/20/2022 0852   BUN 10 07/08/2016 1259   BUN 14 05/27/2014 2215   CREATININE 0.75 02/20/2022 0852   CREATININE 1.09 (H) 05/27/2014 2215   GLUCOSE 109 (H) 02/20/2022 0852   GLUCOSE 104  (H) 05/27/2014 2215   CALCIUM 8.8 (L) 02/20/2022 0852   CALCIUM 9.0 05/27/2014 2215   AST 22 02/20/2022 0852   AST < 5 (L) 08/05/2013 0832   ALT 18 02/20/2022 0852   ALT 24 08/05/2013 0832   ALKPHOS 100 02/20/2022 0852   ALKPHOS 97 08/05/2013 0832   BILITOT 0.3 02/20/2022 0852   BILITOT 0.4 07/08/2016 1259   BILITOT 0.4 08/05/2013 0832   PROT 7.0 02/20/2022 0852   PROT 7.0 07/08/2016 1259   PROT 6.8 08/05/2013 0832   ALBUMIN 3.8 02/20/2022 0852   ALBUMIN 4.0 07/08/2016 1259   ALBUMIN 3.4 08/05/2013 0832    RADIOGRAPHIC STUDIES: ECHOCARDIOGRAM COMPLETE  Result Date: 02/09/2022    ECHOCARDIOGRAM REPORT   Patient Name:   DAJANAE BROPHY Date of Exam: 02/09/2022 Medical Rec #:  [id number]            Height:       66.0 in Accession #:    [id number]           Weight:       191.5 lb Date of Birth:  1961-01-03            BSA:          1.963 m Patient Age:    62 years             BP:           102/62 mmHg Patient Gender: F                    HR:           73 bpm. Exam Location:  [facility] Procedure: 2D Echo, Cardiac Doppler, Color Doppler and Strain Analysis Indications:     Chemo Z09  History:  Patient has prior history of Echocardiogram examinations, most                  recent 09/03/2021. Risk Factors:Diabetes. Breast cancer.  Sonographer:     Sherrie Sport Referring Phys:  [id number] [place] YU Diagnosing Phys: Kathlyn Sacramento MD  Sonographer Comments: Global longitudinal strain was attempted. IMPRESSIONS  1. Left ventricular ejection fraction, by estimation, is 55 to 60%. The left ventricle has normal function. The left ventricle has no regional wall motion abnormalities. Left ventricular diastolic parameters were normal. The average left ventricular global longitudinal strain is -19.1 %. The global longitudinal strain is normal.  2. Right ventricular systolic function is normal. The right ventricular size is normal. There is normal pulmonary artery systolic pressure.  3. The mitral valve is normal in  structure. Trivial mitral valve regurgitation. No evidence of mitral stenosis.  4. The aortic valve is normal in structure. Aortic valve regurgitation is not visualized. No aortic stenosis is present.  5. The inferior vena cava is normal in size with greater than 50% respiratory variability, suggesting right atrial pressure of 3 mmHg. FINDINGS  Left Ventricle: Left ventricular ejection fraction, by estimation, is 55 to 60%. The left ventricle has normal function. The left ventricle has no regional wall motion abnormalities. The average left ventricular global longitudinal strain is -19.1 %. The global longitudinal strain is normal. The left ventricular internal cavity size was normal in size. There is no left ventricular hypertrophy. Left ventricular diastolic parameters were normal. Right Ventricle: The right ventricular size is normal. No increase in right ventricular wall thickness. Right ventricular systolic function is normal. There is normal pulmonary artery systolic pressure. The tricuspid regurgitant velocity is 2.57 m/s, and  with an assumed right atrial pressure of 3 mmHg, the estimated right ventricular systolic pressure is 61.4 mmHg. Left Atrium: Left atrial size was normal in size. Right Atrium: Right atrial size was normal in size. Pericardium: There is no evidence of pericardial effusion. Mitral Valve: The mitral valve is normal in structure. Trivial mitral valve regurgitation. No evidence of mitral valve stenosis. Tricuspid Valve: The tricuspid valve is normal in structure. Tricuspid valve regurgitation is trivial. No evidence of tricuspid stenosis. Aortic Valve: The aortic valve is normal in structure. Aortic valve regurgitation is not visualized. No aortic stenosis is present. Aortic valve mean gradient measures 4.0 mmHg. Aortic valve peak gradient measures 8.3 mmHg. Aortic valve area, by VTI measures 2.20 cm. Pulmonic Valve: The pulmonic valve was normal in structure. Pulmonic valve regurgitation  is not visualized. No evidence of pulmonic stenosis. Aorta: The aortic root is normal in size and structure. Venous: The inferior vena cava is normal in size with greater than 50% respiratory variability, suggesting right atrial pressure of 3 mmHg. IAS/Shunts: No atrial level shunt detected by color flow Doppler.  LEFT VENTRICLE PLAX 2D LVIDd:         4.40 cm   Diastology LVIDs:         2.80 cm   LV e' medial:    13.50 cm/s LV PW:         1.00 cm   LV E/e' medial:  7.5 LV IVS:        1.10 cm   LV e' lateral:   14.10 cm/s LVOT diam:     2.00 cm   LV E/e' lateral: 7.2 LV SV:         65 LV SV Index:   33        2D  Longitudinal Strain LVOT Area:     3.14 cm  2D Strain GLS Avg:     -19.1 %  RIGHT VENTRICLE RV Basal diam:  3.30 cm RV Mid diam:    2.80 cm LEFT ATRIUM           Index        RIGHT ATRIUM          Index LA diam:      3.80 cm 1.94 cm/m   RA Area:     9.71 cm LA Vol (A2C): 46.1 ml 23.48 ml/m  RA Volume:   18.70 ml 9.53 ml/m LA Vol (A4C): 16.9 ml 8.61 ml/m  AORTIC VALVE AV Area (Vmax):    2.38 cm AV Area (Vmean):   2.30 cm AV Area (VTI):     2.20 cm AV Vmax:           144.00 cm/s AV Vmean:          96.900 cm/s AV VTI:            0.294 m AV Peak Grad:      8.3 mmHg AV Mean Grad:      4.0 mmHg LVOT Vmax:         109.00 cm/s LVOT Vmean:        71.000 cm/s LVOT VTI:          0.206 m LVOT/AV VTI ratio: 0.70  AORTA Ao Root diam: 2.50 cm MITRAL VALVE                TRICUSPID VALVE MV Area (PHT): 3.33 cm     TR Peak grad:   26.4 mmHg MV Decel Time: 228 msec     TR Vmax:        257.00 cm/s MV E velocity: 101.00 cm/s MV A velocity: 61.30 cm/s   SHUNTS MV E/A ratio:  1.65         Systemic VTI:  0.21 m                             Systemic Diam: 2.00 cm Kathlyn Sacramento MD Electronically signed by Kathlyn Sacramento MD Signature Date/Time: 02/09/2022/2:07:19 PM    Final    DG Chest 2 View  Result Date: 01/27/2022 CLINICAL DATA:  Cough, congestion EXAM: CHEST - 2 VIEW COMPARISON:  09/24/2021 FINDINGS: Right-sided  Port-A-Cath with the tip projecting over the SVC. No focal consolidation. No pleural effusion or pneumothorax. Heart and mediastinal contours are unremarkable. No acute osseous abnormality. IMPRESSION: No active cardiopulmonary disease. Electronically Signed   By: Kathreen Devoid M.D.   On: 01/27/2022 10:32    PERFORMANCE STATUS (ECOG) : 1 - Symptomatic but completely ambulatory  Review of Systems Unless otherwise noted, a complete review of systems is negative.  Physical Exam General: NAD Cardiovascular: regular rate and rhythm Pulmonary: clear ant fields Abdomen: soft, nontender, + bowel sounds GU: no suprapubic tenderness Extremities: no edema, no joint deformities Skin: no rashes Neurological: Weakness but otherwise nonfocal        Assessment and Plan- Patient is a 62 y.o. female  Encounter Diagnoses  Name Primary?   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 Platelets are normal (given recent nosebleed history). Likely cumulative dose dependent. Luckily she has finished her 6 cycles of treatment. Treating with betamethosone cream. Follow up on Monday scheduled. Discussed red flags (blistering, skin removal) that would indicate she would need evaluation for items such as SJS.   Diarrhea: Secondary to her chemotherapy. Has resolved now. She would benefit from IVF and IV magnesium today for her hypomagnesemia. 4 g ordered due to timing restrictions.  Chills: Wide differential. Resolved. Afebrile.   Disposition 4 g magnesium, 1L IVF Betamethosone cream sent into pharmacy for hands Per Dr. Tasia Catchings add on doxycycline to  cover for any cellulitis. Given her diarrhea to doxy will prescribe monodox. Discussed with patient who is agreeable to trailing  RTC Monday/Tuesday [facility], port labs, +- fluids  Patient expressed understanding and was in agreement with this plan. She also understands that She can call clinic at any time with any questions, concerns, or complaints.   Thank you for allowing me to participate in the care of this very pleasant patient.   Time Total: 25  Visit consisted of counseling and education dealing with the complex and emotionally intense issues of symptom management in the setting of serious illness.Greater than 50%  of this time was spent counseling and coordinating care related to the above assessment and plan.  Signed by: Nelwyn Salisbury, PA-C

## 2022-02-20 NOTE — Patient Instructions (Signed)
Hypomagnesemia Hypomagnesemia is a condition in which the level of magnesium in the blood is too low. Magnesium is a mineral that is found in many foods. It is used in many different processes in the body. Hypomagnesemia can affect every organ in the body. In severe cases, it can cause life-threatening problems. What are the causes? This condition may be caused by: Not getting enough magnesium in your diet or not having enough healthy foods to eat (malnutrition). Problems with magnesium absorption in the intestines. Dehydration. Excessive use of alcohol. Vomiting. Severe or long-term (chronic) diarrhea. Some medicines, including medicines that make you urinate more often (diuretics). Certain diseases, such as kidney disease, diabetes, celiac disease, and overactive thyroid. What are the signs or symptoms? Symptoms of this condition include: Loss of appetite, nausea, and vomiting. Involuntary shaking or trembling of a body part (tremor). Muscle weakness or tingling in the arms and legs. Sudden tightening of muscles (muscle spasms). Confusion. Psychiatric issues, such as: Depression and irritability. Psychosis. A feeling of fluttering of the heart (palpitations). Seizures. These symptoms are more severe if magnesium levels drop suddenly. How is this diagnosed? This condition may be diagnosed based on: Your symptoms and medical history. A physical exam. Blood and urine tests. How is this treated? Treatment depends on the cause and the severity of the condition. It may be treated by: Taking a magnesium supplement. This can be taken in pill form. If the condition is severe, magnesium is usually given through an IV. Making changes to your diet. You may be directed to eat foods that have a lot of magnesium, such as green leafy vegetables, peas, beans, and nuts. Not drinking alcohol. If you are struggling not to drink, ask your health care provider for help. Follow these instructions at  home: Eating and drinking     Make sure that your diet includes foods with magnesium. Foods that have a lot of magnesium in them include: Green leafy vegetables, such as spinach and broccoli. Beans and peas. Nuts and seeds, such as almonds and sunflower seeds. Whole grains, such as whole grain bread and fortified cereals. Drink fluids that contain salts and minerals (electrolytes), such as sports drinks, when you are active. Do not drink alcohol. General instructions Take over-the-counter and prescription medicines only as told by your health care provider. Take magnesium supplements as directed if your health care provider tells you to take them. Have your magnesium levels monitored as told by your health care provider. Keep all follow-up visits. This is important. Contact a health care provider if: You get worse instead of better. Your symptoms return. Get help right away if: You develop severe muscle weakness. You have trouble breathing. You feel that your heart is racing. These symptoms may represent a serious problem that is an emergency. Do not wait to see if the symptoms will go away. Get medical help right away. Call your local emergency services (911 in the [place] Do not drive yourself to the hospital. Summary Hypomagnesemia is a condition in which the level of magnesium in the blood is too low. Hypomagnesemia can affect every organ in the body. Treatment may include eating more foods that contain magnesium, taking magnesium supplements, and not drinking alcohol. Have your magnesium levels monitored as told by your health care provider. This information is not intended to replace advice given to you by your health care provider. Make sure you discuss any questions you have with your health care provider. Document Revised: 06/18/2020 Document Reviewed: 06/18/2020 Elsevier Patient Education  [place].

## 2022-02-20 NOTE — Progress Notes (Signed)
Pt arrived at clinic this am for lab/ fluids. Upon arrival pt stated left hand painful, red, swollen. Added on to [facility] for evaluation.  Mg 1.2 today. Per Nelwyn Salisbury PA give 4g Mg today due to scheduling conflicts. Pt has appts to return next week to recheck labs/possible Mg and IVF. AVS given to patient

## 2022-02-22 ENCOUNTER — Other Ambulatory Visit: Payer: Self-pay | Admitting: Oncology

## 2022-02-22 DIAGNOSIS — E876 Hypokalemia: Secondary | ICD-10-CM

## 2022-02-23 ENCOUNTER — Inpatient Hospital Stay: Payer: Medicare HMO

## 2022-02-23 ENCOUNTER — Telehealth: Payer: Self-pay

## 2022-02-23 ENCOUNTER — Inpatient Hospital Stay (HOSPITAL_BASED_OUTPATIENT_CLINIC_OR_DEPARTMENT_OTHER): Payer: Medicare HMO | Admitting: Medical Oncology

## 2022-02-23 VITALS — BP 118/74 | HR 63 | Temp 97.8°F | Wt 194.2 lb

## 2022-02-23 DIAGNOSIS — L271 Localized skin eruption due to drugs and medicaments taken internally: Secondary | ICD-10-CM | POA: Diagnosis not present

## 2022-02-23 DIAGNOSIS — E876 Hypokalemia: Secondary | ICD-10-CM

## 2022-02-23 DIAGNOSIS — T451X5A Adverse effect of antineoplastic and immunosuppressive drugs, initial encounter: Secondary | ICD-10-CM

## 2022-02-23 DIAGNOSIS — K521 Toxic gastroenteritis and colitis: Secondary | ICD-10-CM | POA: Diagnosis not present

## 2022-02-23 DIAGNOSIS — Z5112 Encounter for antineoplastic immunotherapy: Secondary | ICD-10-CM | POA: Diagnosis not present

## 2022-02-23 LAB — BASIC METABOLIC PANEL
Anion gap: 8 (ref 5–15)
BUN: 13 mg/dL (ref 6–20)
CO2: 24 mmol/L (ref 22–32)
Calcium: 8.7 mg/dL — ABNORMAL LOW (ref 8.9–10.3)
Chloride: 107 mmol/L (ref 98–111)
Creatinine, Ser: 0.76 mg/dL (ref 0.44–1.00)
GFR, Estimated: >60 mL/min (ref >60)
Glucose, Bld: 100 mg/dL — ABNORMAL HIGH (ref 70–99)
Potassium: 3.5 mmol/L (ref 3.5–5.1)
Sodium: 139 mmol/L (ref 135–145)

## 2022-02-23 LAB — MAGNESIUM: Magnesium: 1.3 mg/dL — ABNORMAL LOW (ref 1.7–2.4)

## 2022-02-23 MED ORDER — SODIUM CHLORIDE 0.9 % IV SOLN
INTRAVENOUS | Status: DC
  Filled 2022-02-23 (×2): qty 250

## 2022-02-23 MED ORDER — MAGNESIUM SULFATE 4 GM/100ML IV SOLN
4.0000 g | Freq: Once | INTRAVENOUS | Status: AC
  Administered 2022-02-23: 4 g via INTRAVENOUS
  Filled 2022-02-23: qty 100

## 2022-02-23 MED ORDER — HEPARIN SOD (PORK) LOCK FLUSH 100 UNIT/ML IV SOLN
500.0000 [IU] | Freq: Once | INTRAVENOUS | Status: AC
  Administered 2022-02-23: 500 [IU] via INTRAVENOUS
  Filled 2022-02-23: qty 5

## 2022-02-23 MED ORDER — SODIUM CHLORIDE 0.9% FLUSH
10.0000 mL | Freq: Once | INTRAVENOUS | Status: AC
  Administered 2022-02-23: 10 mL via INTRAVENOUS
  Filled 2022-02-23: qty 10

## 2022-02-23 NOTE — Progress Notes (Signed)
Symptom Management [place] at [facility] [phone] [phone]  Patient Care Team: [place] as PCP - General Daiva Huge, RN as Oncology Nurse Navigator Earlie Server, MD as Consulting Physician (Oncology)   Name of the patient: Vanessa Fisher  [id number]  July 27, 1960   Oncological History: Breast Cancer of Bilateral Breasts- Stage IIIB (cT4b, cN3c, cM0, G3, ER-, PR-, HER2+)   Current Treatment: neoadjuvant Docetaxel + TCHP with GCSF Ellen Henri) Last cycle (Cycle 6) was on 02/11/2022 with Udenyca support on 02/13/2022. Started on 09/12/2021.   Date of visit: 02/23/22  Reason for Consult: Vanessa Fisher is a 62 y.o. female who presents today for follow up of redness of hands:  Updated history from patient today is that the symptoms started 2 days after her last chemotherapy. Hands/wrists felt swollen, warm and red. Was started on topical steroid cream and doxycyline. She states that overall the heads are feeling a bit better as she thinks the swelling has reduced. A few possible spots on her face but nothing else on body. NO trouble breathing/swallowing. No fevers.   In terms of her electrolyte deficits she is still taking her potassium and slow mag twice daily on most days. Diarrhea comes and goes.    Denies any neurologic complaints. Denies recent fevers or illnesses. Denies any easy bleeding or bruising. Reports good appetite and denies weight loss. Denies chest pain. Denies any nausea, vomiting, constipation. Denies urinary complaints. Patient offers no further specific complaints today.  Wt Readings from Last 3 Encounters:  02/23/22 194 lb 3.2 oz (88.1 kg)  02/20/22 202 lb (91.6 kg)  02/11/22 203 lb 12.8 oz (92.4 kg)     PAST MEDICAL HISTORY: Past Medical History:  Diagnosis Date   Anemia    Anxiety    Arthritis    Asthma    WELL CONTROLLED   Bile acid malabsorption syndrome    Bradycardia    HAD AN  ISSUE WITH THIS IN 2016-NO PROBLEMS SINCE   Breast cancer, left breast ([place]) 08/2021   Carpal tunnel syndrome on right    Depression    Diabetes mellitus without complication (HCC)    Diverticulitis    Gastric reflux    GERD (gastroesophageal reflux disease)    Hand pain 05/12/2016   Headache    H/O MIGRAINES   History of kidney stones    H/O   Lumbar radiculitis    Neck pain, chronic    Osteoarthritis of both knees    Panic attacks     PAST SURGICAL HISTORY:  Past Surgical History:  Procedure Laterality Date   BREAST BIOPSY Left 08/21/2021   Axilla Bx, Hydromarker, path pending   BREAST BIOPSY Left 08/21/2021   [place] Bx, Ribbon Clip, Path Pending   CARPAL TUNNEL RELEASE Right 12/06/2017   Procedure: CARPAL TUNNEL RELEASE;  Surgeon: Earnestine Leys, MD;  Location: [facility] ORS;  Service: Orthopedics;  Laterality: Right;   COLONOSCOPY WITH PROPOFOL N/A 06/11/2021   Procedure: COLONOSCOPY WITH PROPOFOL;  Surgeon: Lin Landsman, MD;  Location: [facility] ENDOSCOPY;  Service: Gastroenterology;  Laterality: N/A;   DORSAL COMPARTMENT RELEASE Right 12/06/2017   Procedure: RELEASE DORSAL COMPARTMENT (DEQUERVAIN);  Surgeon: Earnestine Leys, MD;  Location: [facility] ORS;  Service: Orthopedics;  Laterality: Right;   ESOPHAGOGASTRODUODENOSCOPY (EGD) WITH PROPOFOL N/A 06/11/2021   Procedure: ESOPHAGOGASTRODUODENOSCOPY (EGD) WITH PROPOFOL;  Surgeon: Lin Landsman, MD;  Location: [facility] ENDOSCOPY;  Service: Gastroenterology;  Laterality: N/A;   PARTIAL HYSTERECTOMY  PORTACATH PLACEMENT N/A 09/24/2021   Procedure: INSERTION PORT-A-CATH;  Surgeon: Herbert Pun, MD;  Location: [facility] ORS;  Service: General;  Laterality: N/A;   TUBAL LIGATION      HEMATOLOGY/ONCOLOGY HISTORY:  Oncology History  Breast cancer ([employer])  07/31/2021 Imaging   Bilateral diagnostic mammogram and US showed 5 centimeter LEFT breast mass associated with pleomorphic calcifications is suspicious for invasive ductal  carcinoma.At least 4 LEFT axillary lymph nodes with abnormal morphology   08/21/2021 Cancer Staging   Staging form: Breast, AJCC 8th Edition - Clinical: Stage IIIB (cT4b, cN3c, cM0, G3, ER-, PR-, HER2+) - Signed by Earlie Server, MD on 08/28/2021 Histologic grading system: 3 grade system  -08/21/21 left breast ultrasound-guided biopsy showed invasive mammary carcinoma, grade 3, ER/PR negative, HER2 positive.  Left axillary lymph node biopsy positive for macro metastatic mammary carcinoma, 8 mm in greatest extent.   08/30/2021 Imaging   MRI brain w wo contrast -No metastatic disease or acute intracranial abnormality. Essentially normal for age MRI appearance of the brain.    09/03/2021 Echocardiogram   1. Left ventricular ejection fraction, by estimation, is 55 to 60%. Left ventricular ejection fraction by 3D volume is 58 %. The left ventricle has normal function. The left ventricle has no regional wall motion abnormalities. There is mild left  ventricular hypertrophy. Left ventricular diastolic parameters were normal.  2. Right ventricular systolic function is normal. The right ventricular size is normal.  3. The mitral valve is normal in structure. No evidence of mitral valve regurgitation.  4. The aortic valve was not well visualized. Aortic valve regurgitation is not visualized   09/10/2021 Imaging   PET scan showed Large hypermetabolic left breast mass and diffuse skin thickening,consistent with primary breast carcinoma. Hypermetabolic lymphadenopathy in the left axilla, left subpectoral region, and left supraclavicular region, consistent with metastatic disease. No evidence of metastatic disease within the abdomen or pelvis   09/12/2021 -  Chemotherapy   Patient is on Treatment Plan : BREAST  Docetaxel + Carboplatin + Trastuzumab + Pertuzumab  (TCHP) q21d       Genetic Testing   Negative genetic testing. No pathogenic variants identified on the [facility] Common Hereditary Cancers+RNA panel. The  report date is 10/26/2021.  The Common Hereditary Cancers Panel + RNA offered by [facility] includes sequencing and/or deletion duplication testing of the following 47 genes: APC, ATM, AXIN2, BARD1, BMPR1A, BRCA1, BRCA2, BRIP1, CDH1, CDKN2A (p14ARF), CDKN2A (p16INK4a), CKD4, CHEK2, CTNNA1, DICER1, EPCAM (Deletion/duplication testing only), GREM1 (promoter region deletion/duplication testing only), KIT, MEN1, MLH1, MSH2, MSH3, MSH6, MUTYH, NBN, NF1, NHTL1, PALB2, PDGFRA, PMS2, POLD1, POLE, PTEN, RAD50, RAD51C, RAD51D, SDHB, SDHC, SDHD, SMAD4, SMARCA4. STK11, TP53, TSC1, TSC2, and VHL.  The following genes were evaluated for sequence changes only: SDHA and HOXB13 c.251G>A variant only.   02/09/2022 Echocardiogram   LVEF 55 to 60%      ALLERGIES:  is allergic to bc powder [aspirin-salicylamide-caffeine], doxycycline, gabapentin, hydrocodone-acetaminophen, latex, penicillin g, penicillins, and shellfish allergy.  MEDICATIONS:  Current Outpatient Medications  Medication Sig Dispense Refill   albuterol (VENTOLIN HFA) 108 (90 Base) MCG/ACT inhaler Inhale 2 puffs into the lungs every 6 (six) hours as needed for wheezing or shortness of breath. 18 g 0   Azelastine HCl 137 MCG/SPRAY SOLN Place 1 spray into the nose daily. 30 mL 0   betamethasone dipropionate 0.05 % cream Apply topically 2 (two) times daily. 30 g 0   cetirizine (ZYRTEC ALLERGY) 10 MG tablet Take 1 tablet (10 mg total) by mouth  daily. 90 tablet 0   dexamethasone (DECADRON) 4 MG tablet Take 2 tablets (8 mg total) by mouth daily. Start the day before Taxotere. Then take daily x 2 days after chemotherapy. 30 tablet 1   diphenoxylate-atropine (LOMOTIL) 2.5-0.025 MG tablet Take 1 tablet by mouth 4 (four) times daily. 60 tablet 1   doxycycline (MONODOX) 100 MG capsule Take 1 capsule (100 mg total) by mouth 2 (two) times daily for 10 days. 20 capsule 0   esomeprazole (NEXIUM) 40 MG capsule Take 1 capsule (40 mg total) by mouth daily at 12 noon. 30  capsule 3   FLOVENT DISKUS 250 MCG/ACT AEPB Inhale 1 puff into the lungs daily.     fluticasone (FLONASE) 50 MCG/ACT nasal spray Place 2 sprays into both nostrils daily. 15.8 mL 0   lidocaine-prilocaine (EMLA) cream Apply 1 Application topically as needed. 30 g 12   lisinopril (ZESTRIL) 10 MG tablet Take 10 mg by mouth daily.     loperamide (IMODIUM) 2 MG capsule Take 1 capsule (2 mg total) by mouth See admin instructions. Initial: 4 mg,the 2 mg every 2 hours (4 mg every 4 hours at night)  maximum: 16 mg/day 90 capsule 1   magic mouthwash (multi-ingredient) oral suspension Swish and spit 5-10 ml by mouth 4 times a day as needed 400 mL 1   magnesium chloride (SLOW-MAG) 64 MG TBEC SR tablet Take 1 tablet (64 mg total) by mouth daily. 30 tablet 3   montelukast (SINGULAIR) 10 MG tablet Take by mouth.     ondansetron (ZOFRAN) 8 MG tablet Take 1 tablet (8 mg total) by mouth 2 (two) times daily as needed (Nausea or vomiting). Start on the third day after chemotherapy. 30 tablet 1   oxyCODONE-acetaminophen (PERCOCET/ROXICET) 5-325 MG tablet Take 1 tablet by mouth every 6 (six) hours as needed for pain.     potassium chloride SA (KLOR-CON M) 20 MEQ tablet Take 1 tablet (20 mEq total) by mouth 2 (two) times daily. 60 tablet 1   prochlorperazine (COMPAZINE) 10 MG tablet Take 1 tablet (10 mg total) by mouth every 6 (six) hours as needed (Nausea or vomiting). 30 tablet 1   albuterol (PROVENTIL) (2.5 MG/3ML) 0.083% nebulizer solution Take 3 mLs (2.5 mg total) by nebulization every 4 (four) hours as needed for wheezing or shortness of breath. (Patient not taking: Reported on 01/27/2022) 75 mL 2   naloxone (NARCAN) nasal spray 4 mg/0.1 mL  (Patient not taking: Reported on 12/17/2021)     zinc gluconate 50 MG tablet Take 50 mg by mouth daily. (Patient not taking: Reported on 02/23/2022)     No current facility-administered medications for this visit.   Facility-Administered Medications Ordered in Other Visits   Medication Dose Route Frequency Provider Last Rate Last Admin   0.9 %  sodium chloride infusion   Intravenous Continuous Hughie Closs, PA-C 100 mL/hr at 02/23/22 0946 New Bag at 02/23/22 0946   heparin lock flush 100 unit/mL  500 Units Intravenous Once Batu Cassin M, PA-C       magnesium sulfate IVPB 2 g 50 mL  2 g Intravenous Once Saba Neuman M, PA-C       magnesium sulfate IVPB 4 g 100 mL  4 g Intravenous Once Hughie Closs, PA-C 50 mL/hr at 02/23/22 0950 4 g at 02/23/22 0950    VITAL SIGNS: BP 118/74 (BP Location: Left Arm, Patient Position: Sitting, Cuff Size: Normal)   Pulse 63   Temp 97.8 F (36.6 C) (  Tympanic)   Wt 194 lb 3.2 oz (88.1 kg)   BMI 31.34 kg/m  Filed Weights   02/23/22 0902  Weight: 194 lb 3.2 oz (88.1 kg)    Estimated body mass index is 31.34 kg/m as calculated from the following:   Height as of 02/20/22: '5\' 6"'$  (1.676 m).   Weight as of this encounter: 194 lb 3.2 oz (88.1 kg).  LABS: CBC:    Component Value Date/Time   WBC 7.9 02/20/2022 0852   HGB 8.2 (L) 02/20/2022 0852   HGB 11.9 (L) 05/27/2014 2215   HCT 25.7 (L) 02/20/2022 0852   HCT 36.4 05/27/2014 2215   PLT 219 02/20/2022 0852   PLT 293 05/27/2014 2215   MCV 90.8 02/20/2022 0852   MCV 81 05/27/2014 2215   NEUTROABS 3.9 02/20/2022 0852   NEUTROABS 5.4 05/27/2014 2215   LYMPHSABS 1.9 02/20/2022 0852   LYMPHSABS 3.0 05/27/2014 2215   MONOABS 1.4 (H) 02/20/2022 0852   MONOABS 0.6 05/27/2014 2215   EOSABS 0.0 02/20/2022 0852   EOSABS 0.0 05/27/2014 2215   BASOSABS 0.1 02/20/2022 0852   BASOSABS 0.0 05/27/2014 2215   Comprehensive Metabolic Panel:    Component Value Date/Time   NA 139 02/23/2022 0852   NA 143 07/08/2016 1259   NA 139 05/27/2014 2215   K 3.5 02/23/2022 0852   K 3.5 05/27/2014 2215   CL 107 02/23/2022 0852   CL 105 05/27/2014 2215   CO2 24 02/23/2022 0852   CO2 26 05/27/2014 2215   BUN 13 02/23/2022 0852   BUN 10 07/08/2016 1259   BUN 14  05/27/2014 2215   CREATININE 0.76 02/23/2022 0852   CREATININE 1.09 (H) 05/27/2014 2215   GLUCOSE 100 (H) 02/23/2022 0852   GLUCOSE 104 (H) 05/27/2014 2215   CALCIUM 8.7 (L) 02/23/2022 0852   CALCIUM 9.0 05/27/2014 2215   AST 22 02/20/2022 0852   AST < 5 (L) 08/05/2013 0832   ALT 18 02/20/2022 0852   ALT 24 08/05/2013 0832   ALKPHOS 100 02/20/2022 0852   ALKPHOS 97 08/05/2013 0832   BILITOT 0.3 02/20/2022 0852   BILITOT 0.4 07/08/2016 1259   BILITOT 0.4 08/05/2013 0832   PROT 7.0 02/20/2022 0852   PROT 7.0 07/08/2016 1259   PROT 6.8 08/05/2013 0832   ALBUMIN 3.8 02/20/2022 0852   ALBUMIN 4.0 07/08/2016 1259   ALBUMIN 3.4 08/05/2013 0832    RADIOGRAPHIC STUDIES: ECHOCARDIOGRAM COMPLETE  Result Date: 02/09/2022    ECHOCARDIOGRAM REPORT   Patient Name:   Vanessa Fisher Date of Exam: 02/09/2022 Medical Rec #:  [id number]            Height:       66.0 in Accession #:    [id number]           Weight:       191.5 lb Date of Birth:  [birth year]            BSA:          1.963 m Patient Age:    10 years             BP:           102/62 mmHg Patient Gender: F                    HR:           73 bpm. Exam Location:  [facility] Procedure: 2D Echo, Cardiac Doppler, Color Doppler and Strain Analysis Indications:  Chemo Z09  History:         Patient has prior history of Echocardiogram examinations, most                  recent 09/03/2021. Risk Factors:Diabetes. Breast cancer.  Sonographer:     Sherrie Sport Referring Phys:  [id number] [place] [facility] Diagnosing Phys: Kathlyn Sacramento MD  Sonographer Comments: Global longitudinal strain was attempted. IMPRESSIONS  1. Left ventricular ejection fraction, by estimation, is 55 to 60%. The left ventricle has normal function. The left ventricle has no regional wall motion abnormalities. Left ventricular diastolic parameters were normal. The average left ventricular global longitudinal strain is -19.1 %. The global longitudinal strain is normal.  2. Right ventricular systolic  function is normal. The right ventricular size is normal. There is normal pulmonary artery systolic pressure.  3. The mitral valve is normal in structure. Trivial mitral valve regurgitation. No evidence of mitral stenosis.  4. The aortic valve is normal in structure. Aortic valve regurgitation is not visualized. No aortic stenosis is present.  5. The inferior vena cava is normal in size with greater than 50% respiratory variability, suggesting right atrial pressure of 3 mmHg. FINDINGS  Left Ventricle: Left ventricular ejection fraction, by estimation, is 55 to 60%. The left ventricle has normal function. The left ventricle has no regional wall motion abnormalities. The average left ventricular global longitudinal strain is -19.1 %. The global longitudinal strain is normal. The left ventricular internal cavity size was normal in size. There is no left ventricular hypertrophy. Left ventricular diastolic parameters were normal. Right Ventricle: The right ventricular size is normal. No increase in right ventricular wall thickness. Right ventricular systolic function is normal. There is normal pulmonary artery systolic pressure. The tricuspid regurgitant velocity is 2.57 m/s, and  with an assumed right atrial pressure of 3 mmHg, the estimated right ventricular systolic pressure is 44.0 mmHg. Left Atrium: Left atrial size was normal in size. Right Atrium: Right atrial size was normal in size. Pericardium: There is no evidence of pericardial effusion. Mitral Valve: The mitral valve is normal in structure. Trivial mitral valve regurgitation. No evidence of mitral valve stenosis. Tricuspid Valve: The tricuspid valve is normal in structure. Tricuspid valve regurgitation is trivial. No evidence of tricuspid stenosis. Aortic Valve: The aortic valve is normal in structure. Aortic valve regurgitation is not visualized. No aortic stenosis is present. Aortic valve mean gradient measures 4.0 mmHg. Aortic valve peak gradient measures  8.3 mmHg. Aortic valve area, by VTI measures 2.20 cm. Pulmonic Valve: The pulmonic valve was normal in structure. Pulmonic valve regurgitation is not visualized. No evidence of pulmonic stenosis. Aorta: The aortic root is normal in size and structure. Venous: The inferior vena cava is normal in size with greater than 50% respiratory variability, suggesting right atrial pressure of 3 mmHg. IAS/Shunts: No atrial level shunt detected by color flow Doppler.  LEFT VENTRICLE PLAX 2D LVIDd:         4.40 cm   Diastology LVIDs:         2.80 cm   LV e' medial:    13.50 cm/s LV PW:         1.00 cm   LV E/e' medial:  7.5 LV IVS:        1.10 cm   LV e' lateral:   14.10 cm/s LVOT diam:     2.00 cm   LV E/e' lateral: 7.2 LV SV:         65 LV SV  Index:   33        2D Longitudinal Strain LVOT Area:     3.14 cm  2D Strain GLS Avg:     -19.1 %  RIGHT VENTRICLE RV Basal diam:  3.30 cm RV Mid diam:    2.80 cm LEFT ATRIUM           Index        RIGHT ATRIUM          Index LA diam:      3.80 cm 1.94 cm/m   RA Area:     9.71 cm LA Vol (A2C): 46.1 ml 23.48 ml/m  RA Volume:   18.70 ml 9.53 ml/m LA Vol (A4C): 16.9 ml 8.61 ml/m  AORTIC VALVE AV Area (Vmax):    2.38 cm AV Area (Vmean):   2.30 cm AV Area (VTI):     2.20 cm AV Vmax:           144.00 cm/s AV Vmean:          96.900 cm/s AV VTI:            0.294 m AV Peak Grad:      8.3 mmHg AV Mean Grad:      4.0 mmHg LVOT Vmax:         109.00 cm/s LVOT Vmean:        71.000 cm/s LVOT VTI:          0.206 m LVOT/AV VTI ratio: 0.70  AORTA Ao Root diam: 2.50 cm MITRAL VALVE                TRICUSPID VALVE MV Area (PHT): 3.33 cm     TR Peak grad:   26.4 mmHg MV Decel Time: 228 msec     TR Vmax:        257.00 cm/s MV E velocity: 101.00 cm/s MV A velocity: 61.30 cm/s   SHUNTS MV E/A ratio:  1.65         Systemic VTI:  0.21 m                             Systemic Diam: 2.00 cm Kathlyn Sacramento MD Electronically signed by Kathlyn Sacramento MD Signature Date/Time: 02/09/2022/2:07:19 PM    Final    DG  Chest 2 View  Result Date: 01/27/2022 CLINICAL DATA:  Cough, congestion EXAM: CHEST - 2 VIEW COMPARISON:  09/24/2021 FINDINGS: Right-sided Port-A-Cath with the tip projecting over the SVC. No focal consolidation. No pleural effusion or pneumothorax. Heart and mediastinal contours are unremarkable. No acute osseous abnormality. IMPRESSION: No active cardiopulmonary disease. Electronically Signed   By: Kathreen Devoid M.D.   On: 01/27/2022 10:32    PERFORMANCE STATUS (ECOG) : 1 - Symptomatic but completely ambulatory  Review of Systems Unless otherwise noted, a complete review of systems is negative.  Physical Exam General: NAD Cardiovascular: regular rate and rhythm Pulmonary: clear ant fields Abdomen: soft, nontender, + bowel sounds GU: no suprapubic tenderness Extremities: no edema, no joint deformities Skin: See below Neurological: Weakness but otherwise nonfocal (02/20/2022) below       (02/23/2022) below           Assessment and Plan- Patient is a 62 y.o. female  Encounter Diagnoses  Name Primary?   Palmar-plantar erythrodysesthesia Yes   Hypomagnesemia    Chemotherapy induced diarrhea     Palmar-plantar erythrodysesthesia is new and likely secondary to her Pertuzumab. She has finished  her chemotherapy treatments. Swelling and redness appear improved today. Continue the topical steroid cream for the next week. The area is now a bit itchy and she thinks it may be from the doxycyline. She only has 2 more days left- I have agreed to have her stop this medication. She has follow up tomorrow.    Diarrhea: Chronically off/on thought to be due to her chemotherapy. Continue electrolyte supplementation. 4g IV mag today.   Disposition 4 g magnesium RTC tomorrow with Dr. Tasia Catchings as previously scheduled   Patient expressed understanding and was in agreement with this plan. She also understands that She can call clinic at any time with any questions, concerns, or complaints.    Thank you for allowing me to participate in the care of this very pleasant patient.   Time Total: 25  Visit consisted of counseling and education dealing with the complex and emotionally intense issues of symptom management in the setting of serious illness.Greater than 50%  of this time was spent counseling and coordinating care related to the above assessment and plan.  Signed by: Nelwyn Salisbury, PA-C

## 2022-02-23 NOTE — Telephone Encounter (Signed)
Please cancel appts on 1/23 Add MD to appt on Friday 1/26.

## 2022-02-24 ENCOUNTER — Inpatient Hospital Stay: Payer: Medicare HMO

## 2022-02-24 ENCOUNTER — Inpatient Hospital Stay: Payer: Medicare HMO | Admitting: Oncology

## 2022-02-27 ENCOUNTER — Inpatient Hospital Stay (HOSPITAL_BASED_OUTPATIENT_CLINIC_OR_DEPARTMENT_OTHER): Payer: Medicare HMO | Admitting: Oncology

## 2022-02-27 ENCOUNTER — Inpatient Hospital Stay: Payer: Medicare HMO

## 2022-02-27 ENCOUNTER — Encounter: Payer: Self-pay | Admitting: Oncology

## 2022-02-27 VITALS — BP 108/69 | HR 79 | Temp 97.7°F | Resp 20

## 2022-02-27 DIAGNOSIS — G4709 Other insomnia: Secondary | ICD-10-CM

## 2022-02-27 DIAGNOSIS — C50011 Malignant neoplasm of nipple and areola, right female breast: Secondary | ICD-10-CM | POA: Diagnosis not present

## 2022-02-27 DIAGNOSIS — E876 Hypokalemia: Secondary | ICD-10-CM

## 2022-02-27 DIAGNOSIS — C50812 Malignant neoplasm of overlapping sites of left female breast: Secondary | ICD-10-CM

## 2022-02-27 DIAGNOSIS — L271 Localized skin eruption due to drugs and medicaments taken internally: Secondary | ICD-10-CM | POA: Diagnosis not present

## 2022-02-27 DIAGNOSIS — C801 Malignant (primary) neoplasm, unspecified: Secondary | ICD-10-CM | POA: Insufficient documentation

## 2022-02-27 DIAGNOSIS — T451X5A Adverse effect of antineoplastic and immunosuppressive drugs, initial encounter: Secondary | ICD-10-CM

## 2022-02-27 DIAGNOSIS — Z171 Estrogen receptor negative status [ER-]: Secondary | ICD-10-CM

## 2022-02-27 DIAGNOSIS — D6481 Anemia due to antineoplastic chemotherapy: Secondary | ICD-10-CM

## 2022-02-27 DIAGNOSIS — F411 Generalized anxiety disorder: Secondary | ICD-10-CM

## 2022-02-27 DIAGNOSIS — G4700 Insomnia, unspecified: Secondary | ICD-10-CM | POA: Insufficient documentation

## 2022-02-27 DIAGNOSIS — Z5112 Encounter for antineoplastic immunotherapy: Secondary | ICD-10-CM | POA: Diagnosis not present

## 2022-02-27 LAB — BASIC METABOLIC PANEL
Anion gap: 8 (ref 5–15)
BUN: 10 mg/dL (ref 6–20)
CO2: 24 mmol/L (ref 22–32)
Calcium: 8.5 mg/dL — ABNORMAL LOW (ref 8.9–10.3)
Chloride: 106 mmol/L (ref 98–111)
Creatinine, Ser: 0.73 mg/dL (ref 0.44–1.00)
GFR, Estimated: >60 mL/min (ref >60)
Glucose, Bld: 100 mg/dL — ABNORMAL HIGH (ref 70–99)
Potassium: 3.8 mmol/L (ref 3.5–5.1)
Sodium: 138 mmol/L (ref 135–145)

## 2022-02-27 LAB — MAGNESIUM: Magnesium: 1.4 mg/dL — ABNORMAL LOW (ref 1.7–2.4)

## 2022-02-27 MED ORDER — SODIUM CHLORIDE 0.9 % IV SOLN
INTRAVENOUS | Status: DC
  Filled 2022-02-27 (×2): qty 250

## 2022-02-27 MED ORDER — ALPRAZOLAM 0.25 MG PO TABS: 0.2500 mg | ORAL_TABLET | Freq: Every day | ORAL | 0 refills | Status: DC | PRN

## 2022-02-27 MED ORDER — BETAMETHASONE DIPROPIONATE 0.05 % EX CREA: TOPICAL_CREAM | Freq: Two times a day (BID) | CUTANEOUS | 0 refills | Status: DC

## 2022-02-27 MED ORDER — HEPARIN SOD (PORK) LOCK FLUSH 100 UNIT/ML IV SOLN
500.0000 [IU] | Freq: Once | INTRAVENOUS | Status: AC | PRN
  Administered 2022-02-27: 500 [IU]
  Filled 2022-02-27: qty 5

## 2022-02-27 MED ORDER — TRAZODONE HCL 50 MG PO TABS: 50.0000 mg | ORAL_TABLET | Freq: Every day | ORAL | 0 refills | Status: DC

## 2022-02-27 MED ORDER — MAGNESIUM SULFATE 4 GM/100ML IV SOLN
4.0000 g | Freq: Once | INTRAVENOUS | Status: AC
  Administered 2022-02-27: 4 g via INTRAVENOUS
  Filled 2022-02-27: qty 100

## 2022-02-27 NOTE — Assessment & Plan Note (Signed)
Hemoglobin is trending up.  Monitor counts.

## 2022-02-27 NOTE — Assessment & Plan Note (Signed)
Start Trazodon '50mg'$  QHS

## 2022-02-27 NOTE — Assessment & Plan Note (Addendum)
Left breast cancer, cT4b N3 Mx, ER/PR-, HER2 + Overall she tolerates chemotherapy except diarrhea. Clinically left breast mass has responded to treatment. Labs are reviewed and discussed with patient. Echo showed stable LVEF S/p 6 cycles of TCHP  Clinically breast mass is no longer palpable. Repeat left diagnostic mammogram.  She will need to be re-evaluated by Dr. Peyton Najjar for surgery.

## 2022-02-27 NOTE — Progress Notes (Signed)
Hematology/Oncology Progress note [phone] [phone]         Patient Care Team: [place] as PCP - [place], [place], RN as Oncology Nurse Navigator Earlie Server, MD as Consulting Physician (Oncology)  REFERRING PROVIDER: [place]:   Cancer Staging  Breast cancer [facility]) Staging form: Breast, AJCC 8th Edition - Clinical stage from 08/21/2021: Stage IIIB (cT4b, cN3c, cM0, G3, ER-, PR-, HER2+) - Signed by Earlie Server, MD on 09/12/2021  Breast cancer ([place]) Left breast cancer, cT4b N3 Mx, ER/PR-, HER2 + Overall she tolerates chemotherapy except diarrhea. Clinically left breast mass has responded to treatment. Labs are reviewed and discussed with patient. Echo showed stable LVEF S/p 6 cycles of TCHP  Clinically breast mass is no longer palpable. Repeat left diagnostic mammogram.  She will need to be re-evaluated by Dr. Peyton Najjar for surgery.   Anemia due to antineoplastic chemotherapy Hemoglobin is trending up.  Monitor counts.   Hypokalemia continue potassium chloride 71mq twice daily.   Hypomagnesemia IV magnesium PRN if Mag <1.7 Continue supportive care. 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 Continue to apply moisturizers, steroid cream  Insomnia Start Trazodon '50mg'$  QHS  Anxiety associated with cancer diagnosis ([place] Recommend low dose Xananx 0.'25mg'$  daily PRN  Orders Placed This Encounter  Procedures   Magnesium    Standing Status:   Standing    Number of Occurrences:   4    Standing Expiration Date:   02/28/2023     All questions were answered. The patient knows to call the clinic with any problems questions or concerns.  Return of visit:  Per LOS  ZEarlie Server MD, PhD [facility]cology 02/27/2022     CHIEF COMPLAINTS/REASON FOR VISIT:  Follow-up for breast cancer  HISTORY OF PRESENTING ILLNESS:  Vanessa CRUNKis a   62y.o.  female with PMH listed below who was referred to me for evaluation of  left breast cancer SUMMARY OF ONCOLOGIC HISTORY: Oncology History  Breast cancer ([place]  07/31/2021 Imaging   Bilateral diagnostic mammogram and [place]owed 5 centimeter LEFT breast mass associated with pleomorphic calcifications is suspicious for invasive ductal carcinoma.At least 4 LEFT axillary lymph nodes with abnormal morphology   08/21/2021 Cancer Staging   Staging form: Breast, AJCC 8th Edition - Clinical: Stage IIIB (cT4b, cN3c, cM0, G3, ER-, PR-, HER2+) - Signed by YEarlie Server MD on 08/28/2021 Histologic grading system: 3 grade system  -08/21/21 left breast ultrasound-guided biopsy showed invasive mammary carcinoma, grade 3, ER/PR negative, HER2 positive.  Left axillary lymph node biopsy positive for macro metastatic mammary carcinoma, 8 mm in greatest extent.   08/30/2021 Imaging   MRI brain w wo contrast -No metastatic disease or acute intracranial abnormality. Essentially normal for age MRI appearance of the brain.    09/03/2021 Echocardiogram   1. Left ventricular ejection fraction, by estimation, is 55 to 60%. Left ventricular ejection fraction by 3D volume is 58 %. The left ventricle has normal function. The left ventricle has no regional wall motion abnormalities. There is mild left  ventricular hypertrophy. Left ventricular diastolic parameters were normal.  2. Right ventricular systolic function is normal. The right ventricular size is normal.  3. The mitral valve is normal in structure. No evidence of mitral valve regurgitation.  4. The aortic valve was not well visualized. Aortic valve regurgitation is not visualized   09/10/2021 Imaging   PET scan showed  Large hypermetabolic left breast mass and diffuse skin thickening,consistent with primary breast carcinoma. Hypermetabolic lymphadenopathy in the left axilla, left subpectoral region, and left supraclavicular region, consistent with metastatic  disease. No evidence of metastatic disease within the abdomen or pelvis   09/12/2021 -  Chemotherapy   Patient is on Treatment Plan : BREAST  Docetaxel + Carboplatin + Trastuzumab + Pertuzumab  (TCHP) q21d       Genetic Testing   Negative genetic testing. No pathogenic variants identified on the [facility] Common Hereditary Cancers+RNA panel. The report date is 10/26/2021.  The Common Hereditary Cancers Panel + RNA offered by [facility] includes sequencing and/or deletion duplication testing of the following 47 genes: APC, ATM, AXIN2, BARD1, BMPR1A, BRCA1, BRCA2, BRIP1, CDH1, CDKN2A (p14ARF), CDKN2A (p16INK4a), CKD4, CHEK2, CTNNA1, DICER1, EPCAM (Deletion/duplication testing only), GREM1 (promoter region deletion/duplication testing only), KIT, MEN1, MLH1, MSH2, MSH3, MSH6, MUTYH, NBN, NF1, NHTL1, PALB2, PDGFRA, PMS2, POLD1, POLE, PTEN, RAD50, RAD51C, RAD51D, SDHB, SDHC, SDHD, SMAD4, SMARCA4. STK11, TP53, TSC1, TSC2, and VHL.  The following genes were evaluated for sequence changes only: SDHA and HOXB13 c.251G>A variant only.   02/09/2022 Echocardiogram   LVEF 55 to 60%      INTERVAL HISTORY Vanessa Fisher is a 62 y.o. female who has above history reviewed by me today presents for follow up visit for Triple positive breast cancer treatment.  Currently she is on neoadjuvant TCHP with GCSF support, overall she tolerates moderate difficulties. chemotherapy induced diarrhea, improved after using lomotil/imodium C diff negative.  No diarrhea today.  Chronic nasal pressure.  + insomnia, due to shift work .  MEDICAL HISTORY:  Past Medical History:  Diagnosis Date   Anemia    Anxiety    Arthritis    Asthma    WELL CONTROLLED   Bile acid malabsorption syndrome    Bradycardia    HAD AN ISSUE WITH THIS IN 2016-NO PROBLEMS SINCE   Breast cancer, left breast ([place]) 08/2021   Carpal tunnel syndrome on right    Depression    Diabetes mellitus without complication (HCC)    Diverticulitis    Gastric  reflux    GERD (gastroesophageal reflux disease)    Hand pain 05/12/2016   Headache    H/O MIGRAINES   History of kidney stones    H/O   Lumbar radiculitis    Neck pain, chronic    Osteoarthritis of both knees    Panic attacks     SURGICAL HISTORY: Past Surgical History:  Procedure Laterality Date   BREAST BIOPSY Left 08/21/2021   Axilla Bx, Hydromarker, path pending   BREAST BIOPSY Left 08/21/2021   [place] Bx, Ribbon Clip, Path Pending   CARPAL TUNNEL RELEASE Right 12/06/2017   Procedure: CARPAL TUNNEL RELEASE;  Surgeon: Earnestine Leys, MD;  Location: [facility] ORS;  Service: Orthopedics;  Laterality: Right;   COLONOSCOPY WITH PROPOFOL N/A 06/11/2021   Procedure: COLONOSCOPY WITH PROPOFOL;  Surgeon: Lin Landsman, MD;  Location: [facility] ENDOSCOPY;  Service: Gastroenterology;  Laterality: N/A;   DORSAL COMPARTMENT RELEASE Right 12/06/2017   Procedure: RELEASE DORSAL COMPARTMENT (DEQUERVAIN);  Surgeon: Earnestine Leys, MD;  Location: [facility] ORS;  Service: Orthopedics;  Laterality: Right;   ESOPHAGOGASTRODUODENOSCOPY (EGD) WITH PROPOFOL N/A 06/11/2021   Procedure: ESOPHAGOGASTRODUODENOSCOPY (EGD) WITH PROPOFOL;  Surgeon: Lin Landsman, MD;  Location: [facility] ENDOSCOPY;  Service: Gastroenterology;  Laterality: N/A;   PARTIAL HYSTERECTOMY     PORTACATH PLACEMENT N/A 09/24/2021   Procedure: INSERTION PORT-A-CATH;  Surgeon: Herbert Pun, MD;  Location: [facility]  ORS;  Service: General;  Laterality: N/A;   TUBAL LIGATION      SOCIAL HISTORY: Social History   Socioeconomic History   Marital status: Single    Spouse name: Not on file   Number of children: Not on file   Years of education: Not on file   Highest education level: Not on file  Occupational History   Not on file  Tobacco Use   Smoking status: Former    Types: Cigarettes   Smokeless tobacco: Never  Vaping Use   Vaping Use: Never used  Substance and Sexual Activity   Alcohol use: No   Drug use: No   Sexual  activity: Not on file  Other Topics Concern   Not on file  Social History Narrative   Lives alone   Social Determinants of Health   Financial Resource Strain: High Risk (08/15/2021)   Overall Financial Resource Strain (CARDIA)    Difficulty of Paying Living Expenses: Very hard  Food Insecurity: Food Insecurity Present (09/09/2021)   Hunger Vital Sign    Worried About [place] in the Last Year: Often true    Ran Out of Food in the Last Year: Often true  Transportation Needs: No Transportation Needs (08/15/2021)   PRAPARE - Hydrologist (Medical): No    Lack of Transportation (Non-Medical): No  Physical Activity: Inactive (08/15/2021)   Exercise Vital Sign    Days of Exercise per Week: 0 days    Minutes of Exercise per Session: 0 min  Stress: Stress Concern Present (08/15/2021)   [place]    Feeling of Stress : Rather much  Social Connections: Socially Isolated (08/15/2021)   Social Connection and Isolation Panel [NHANES]    Frequency of Communication with Friends and Family: Once a week    Frequency of Social Gatherings with Friends and Family: Once a week    Attends Religious Services: Never    Marine scientist or Organizations: No    Attends Music therapist: Not on file    Marital Status: Never married  Intimate Partner Violence: Not At Risk (08/15/2021)   Humiliation, Afraid, Rape, and Kick questionnaire    Fear of Current or Ex-Partner: No    Emotionally Abused: No    Physically Abused: No    Sexually Abused: No    FAMILY HISTORY: Family History  Problem Relation Age of Onset   Diabetes Mother    Hypertension Mother    Heart failure Mother    Pancreatic cancer Mother 47   Diabetes Father    Hypertension Father    Breast cancer Maternal Aunt    Cervical cancer Maternal Aunt    Lung cancer Son 41    ALLERGIES:  is allergic to bc powder  [aspirin-salicylamide-caffeine], doxycycline, gabapentin, hydrocodone-acetaminophen, latex, penicillin g, penicillins, and shellfish allergy.  MEDICATIONS:  Current Outpatient Medications  Medication Sig Dispense Refill   albuterol (VENTOLIN HFA) 108 (90 Base) MCG/ACT inhaler Inhale 2 puffs into the lungs every 6 (six) hours as needed for wheezing or shortness of breath. 18 g 0   ALPRAZolam (XANAX) 0.25 MG tablet Take 1 tablet (0.25 mg total) by mouth daily as needed for anxiety. 30 tablet 0   cetirizine (ZYRTEC ALLERGY) 10 MG tablet Take 1 tablet (10 mg total) by mouth daily. 90 tablet 0   dexamethasone (DECADRON) 4 MG tablet Take 2 tablets (8 mg total) by mouth daily. Start the  day before Taxotere. Then take daily x 2 days after chemotherapy. 30 tablet 1   esomeprazole (NEXIUM) 40 MG capsule Take 1 capsule (40 mg total) by mouth daily at 12 noon. 30 capsule 3   FLOVENT DISKUS 250 MCG/ACT AEPB Inhale 1 puff into the lungs daily.     fluticasone (FLONASE) 50 MCG/ACT nasal spray Place 2 sprays into both nostrils daily. 15.8 mL 0   KLOR-CON M20 20 MEQ tablet TAKE 1 TABLET BY MOUTH TWICE A DAY 60 tablet 1   lidocaine-prilocaine (EMLA) cream Apply 1 Application topically as needed. 30 g 12   lisinopril (ZESTRIL) 10 MG tablet Take 10 mg by mouth daily.     magic mouthwash (multi-ingredient) oral suspension Swish and spit 5-10 ml by mouth 4 times a day as needed 400 mL 1   magnesium chloride (SLOW-MAG) 64 MG TBEC SR tablet Take 1 tablet (64 mg total) by mouth daily. 30 tablet 3   montelukast (SINGULAIR) 10 MG tablet Take by mouth.     oxyCODONE-acetaminophen (PERCOCET/ROXICET) 5-325 MG tablet Take 1 tablet by mouth every 6 (six) hours as needed for pain.     prochlorperazine (COMPAZINE) 10 MG tablet Take 1 tablet (10 mg total) by mouth every 6 (six) hours as needed (Nausea or vomiting). 30 tablet 1   traZODone (DESYREL) 50 MG tablet Take 1 tablet (50 mg total) by mouth at bedtime. 30 tablet 0   zinc  gluconate 50 MG tablet Take 50 mg by mouth daily.     albuterol (PROVENTIL) (2.5 MG/3ML) 0.083% nebulizer solution Take 3 mLs (2.5 mg total) by nebulization every 4 (four) hours as needed for wheezing or shortness of breath. (Patient not taking: Reported on 01/27/2022) 75 mL 2   Azelastine HCl 137 MCG/SPRAY SOLN Place 1 spray into the nose daily. (Patient not taking: Reported on 02/27/2022) 30 mL 0   betamethasone dipropionate 0.05 % cream Apply topically 2 (two) times daily. 30 g 0   diphenoxylate-atropine (LOMOTIL) 2.5-0.025 MG tablet Take 1 tablet by mouth 4 (four) times daily. (Patient not taking: Reported on 02/27/2022) 60 tablet 1   doxycycline (MONODOX) 100 MG capsule Take 1 capsule (100 mg total) by mouth 2 (two) times daily for 10 days. (Patient not taking: Reported on 02/27/2022) 20 capsule 0   loperamide (IMODIUM) 2 MG capsule Take 1 capsule (2 mg total) by mouth See admin instructions. Initial: 4 mg,the 2 mg every 2 hours (4 mg every 4 hours at night)  maximum: 16 mg/day (Patient not taking: Reported on 02/27/2022) 90 capsule 1   naloxone (NARCAN) nasal spray 4 mg/0.1 mL  (Patient not taking: Reported on 12/17/2021)     ondansetron (ZOFRAN) 8 MG tablet Take 1 tablet (8 mg total) by mouth 2 (two) times daily as needed (Nausea or vomiting). Start on the third day after chemotherapy. (Patient not taking: Reported on 02/27/2022) 30 tablet 1   No current facility-administered medications for this visit.   Facility-Administered Medications Ordered in Other Visits  Medication Dose Route Frequency Provider Last Rate Last Admin   0.9 %  sodium chloride infusion   Intravenous Continuous Earlie Server, MD   Stopped at 02/27/22 1224   magnesium sulfate IVPB 2 g 50 mL  2 g Intravenous Once [place], Sarah M, PA-C        Review of Systems  Constitutional:  Negative for appetite change, chills, fatigue and fever.  HENT:   Negative for hearing loss and voice change.  Nasal congestion, pressure  Eyes:   Negative for eye problems.  Respiratory:  Negative for chest tightness and cough.   Cardiovascular:  Negative for chest pain.  Gastrointestinal:  Negative for abdominal distention, abdominal pain and blood in stool.  Endocrine: Negative for hot flashes.  Genitourinary:  Negative for difficulty urinating and frequency.   Musculoskeletal:  Positive for arthralgias and back pain.  Skin:  Negative for itching and rash.  Neurological:  Negative for extremity weakness.  Hematological:  Negative for adenopathy.  Psychiatric/Behavioral:  Negative for confusion.        Feel stressed.    Left breast mass is not palpable on today's breast examination.   PHYSICAL EXAMINATION: ECOG PERFORMANCE STATUS: 1 - Symptomatic but completely ambulatory Vitals:   02/27/22 0939  BP: 108/69  Pulse: 79  Resp: 20  Temp: 97.7 F (36.5 C)   There were no vitals filed for this visit.  Physical Exam Constitutional:      General: She is not in acute distress.    Appearance: She is not diaphoretic.  HENT:     Head: Normocephalic and atraumatic.     Nose: Nose normal.     Mouth/Throat:     Pharynx: No oropharyngeal exudate.  Eyes:     General: No scleral icterus.    Pupils: Pupils are equal, round, and reactive to light.  Cardiovascular:     Rate and Rhythm: Normal rate and regular rhythm.     Heart sounds: No murmur heard. Pulmonary:     Effort: Pulmonary effort is normal. No respiratory distress.     Breath sounds: No rales.  Chest:     Chest wall: No tenderness.  Abdominal:     General: There is no distension.     Palpations: Abdomen is soft.     Tenderness: There is no abdominal tenderness.  Musculoskeletal:        General: Normal range of motion.     Cervical back: Normal range of motion and neck supple.  Skin:    General: Skin is warm and dry.     Findings: No erythema.     Comments: Bilateral finger tip erythematous with small red papules and some desquamation  Neurological:     Mental  Status: She is alert and oriented to person, place, and time.     Cranial Nerves: No cranial nerve deficit.     Motor: No abnormal muscle tone.     Coordination: Coordination normal.  Psychiatric:        Mood and Affect: Affect normal.   Left breast mass breast creased in size.  LABORATORY DATA:  I have reviewed the data as listed    Latest Ref Rng & Units 02/20/2022    8:52 AM 02/17/2022    9:16 AM 02/11/2022    9:28 AM  CBC  WBC 4.0 - 10.5 K/uL 7.9  1.5  5.9   Hemoglobin 12.0 - 15.0 g/dL 8.2  8.7  8.3   Hematocrit 36.0 - 46.0 % 25.7  27.5  25.6   Platelets 150 - 400 K/uL 219  220  186       Latest Ref Rng & Units 02/27/2022    9:19 AM 02/23/2022    8:52 AM 02/20/2022    8:52 AM  CMP  Glucose 70 - 99 mg/dL 100  100  109   BUN 6 - 20 mg/dL '10  13  8   '$ Creatinine 0.44 - 1.00 mg/dL 0.73  0.76  0.75  Sodium 135 - 145 mmol/L 138  139  139   Potassium 3.5 - 5.1 mmol/L 3.8  3.5  3.6   Chloride 98 - 111 mmol/L 106  107  107   CO2 22 - 32 mmol/L '24  24  25   '$ Calcium 8.9 - 10.3 mg/dL 8.5  8.7  8.8   Total Protein 6.5 - 8.1 g/dL   7.0   Total Bilirubin 0.3 - 1.2 mg/dL   0.3   Alkaline Phos 38 - 126 U/L   100   AST 15 - 41 U/L   22   ALT 0 - 44 U/L   18       RADIOGRAPHIC STUDIES: I have personally reviewed the radiological images as listed and agreed with the findings in the report. ECHOCARDIOGRAM COMPLETE  Result Date: 02/09/2022    ECHOCARDIOGRAM REPORT   Patient Name:   Vanessa Fisher Date of Exam: 02/09/2022 Medical Rec #:  [id number]            Height:       66.0 in Accession #:    [id number]           Weight:       191.5 lb Date of Birth:  12-16-1960            BSA:          1.963 m Patient Age:    9 years             BP:           102/62 mmHg Patient Gender: F                    HR:           73 bpm. Exam Location:  [facility] Procedure: 2D Echo, Cardiac Doppler, Color Doppler and Strain Analysis Indications:     Chemo Z09  History:         Patient has prior history of  Echocardiogram examinations, most                  recent 09/03/2021. Risk Factors:Diabetes. Breast cancer.  Sonographer:     Sherrie Sport Referring Phys:  [id number] [place] Jasiah Elsen Diagnosing Phys: Kathlyn Sacramento MD  Sonographer Comments: Global longitudinal strain was attempted. IMPRESSIONS  1. Left ventricular ejection fraction, by estimation, is 55 to 60%. The left ventricle has normal function. The left ventricle has no regional wall motion abnormalities. Left ventricular diastolic parameters were normal. The average left ventricular global longitudinal strain is -19.1 %. The global longitudinal strain is normal.  2. Right ventricular systolic function is normal. The right ventricular size is normal. There is normal pulmonary artery systolic pressure.  3. The mitral valve is normal in structure. Trivial mitral valve regurgitation. No evidence of mitral stenosis.  4. The aortic valve is normal in structure. Aortic valve regurgitation is not visualized. No aortic stenosis is present.  5. The inferior vena cava is normal in size with greater than 50% respiratory variability, suggesting right atrial pressure of 3 mmHg. FINDINGS  Left Ventricle: Left ventricular ejection fraction, by estimation, is 55 to 60%. The left ventricle has normal function. The left ventricle has no regional wall motion abnormalities. The average left ventricular global longitudinal strain is -19.1 %. The global longitudinal strain is normal. The left ventricular internal cavity size was normal in size. There is no left ventricular hypertrophy. Left ventricular diastolic parameters were normal. Right Ventricle: The right ventricular size  is normal. No increase in right ventricular wall thickness. Right ventricular systolic function is normal. There is normal pulmonary artery systolic pressure. The tricuspid regurgitant velocity is 2.57 m/s, and  with an assumed right atrial pressure of 3 mmHg, the estimated right ventricular systolic pressure is 01.6  mmHg. Left Atrium: Left atrial size was normal in size. Right Atrium: Right atrial size was normal in size. Pericardium: There is no evidence of pericardial effusion. Mitral Valve: The mitral valve is normal in structure. Trivial mitral valve regurgitation. No evidence of mitral valve stenosis. Tricuspid Valve: The tricuspid valve is normal in structure. Tricuspid valve regurgitation is trivial. No evidence of tricuspid stenosis. Aortic Valve: The aortic valve is normal in structure. Aortic valve regurgitation is not visualized. No aortic stenosis is present. Aortic valve mean gradient measures 4.0 mmHg. Aortic valve peak gradient measures 8.3 mmHg. Aortic valve area, by VTI measures 2.20 cm. Pulmonic Valve: The pulmonic valve was normal in structure. Pulmonic valve regurgitation is not visualized. No evidence of pulmonic stenosis. Aorta: The aortic root is normal in size and structure. Venous: The inferior vena cava is normal in size with greater than 50% respiratory variability, suggesting right atrial pressure of 3 mmHg. IAS/Shunts: No atrial level shunt detected by color flow Doppler.  LEFT VENTRICLE PLAX 2D LVIDd:         4.40 cm   Diastology LVIDs:         2.80 cm   LV e' medial:    13.50 cm/s LV PW:         1.00 cm   LV E/e' medial:  7.5 LV IVS:        1.10 cm   LV e' lateral:   14.10 cm/s LVOT diam:     2.00 cm   LV E/e' lateral: 7.2 LV SV:         65 LV SV Index:   33        2D Longitudinal Strain LVOT Area:     3.14 cm  2D Strain GLS Avg:     -19.1 %  RIGHT VENTRICLE RV Basal diam:  3.30 cm RV Mid diam:    2.80 cm LEFT ATRIUM           Index        RIGHT ATRIUM          Index LA diam:      3.80 cm 1.94 cm/m   RA Area:     9.71 cm LA Vol (A2C): 46.1 ml 23.48 ml/m  RA Volume:   18.70 ml 9.53 ml/m LA Vol (A4C): 16.9 ml 8.61 ml/m  AORTIC VALVE AV Area (Vmax):    2.38 cm AV Area (Vmean):   2.30 cm AV Area (VTI):     2.20 cm AV Vmax:           144.00 cm/s AV Vmean:          96.900 cm/s AV VTI:             0.294 m AV Peak Grad:      8.3 mmHg AV Mean Grad:      4.0 mmHg LVOT Vmax:         109.00 cm/s LVOT Vmean:        71.000 cm/s LVOT VTI:          0.206 m LVOT/AV VTI ratio: 0.70  AORTA Ao Root diam: 2.50 cm MITRAL VALVE  TRICUSPID VALVE MV Area (PHT): 3.33 cm     TR Peak grad:   26.4 mmHg MV Decel Time: 228 msec     TR Vmax:        257.00 cm/s MV E velocity: 101.00 cm/s MV A velocity: 61.30 cm/s   SHUNTS MV E/A ratio:  1.65         Systemic VTI:  0.21 m                             Systemic Diam: 2.00 cm Kathlyn Sacramento MD Electronically signed by Kathlyn Sacramento MD Signature Date/Time: 02/09/2022/2:07:19 PM    Final

## 2022-02-27 NOTE — Assessment & Plan Note (Signed)
Likely due to taxane treatments. Continue to apply moisturizers, steroid cream

## 2022-02-27 NOTE — Assessment & Plan Note (Signed)
IV magnesium PRN if Mag <1.7 Continue supportive care. Continue oral Magnesium

## 2022-02-27 NOTE — Assessment & Plan Note (Signed)
continue potassium chloride 85mq twice daily.

## 2022-02-27 NOTE — Assessment & Plan Note (Signed)
Recommend low dose Xananx 0.'25mg'$  daily PRN

## 2022-03-03 ENCOUNTER — Ambulatory Visit
Admission: RE | Admit: 2022-03-03 | Discharge: 2022-03-03 | Disposition: A | Discharge status: Home / Self Care | Payer: Medicare HMO | Source: Ambulatory Visit | Attending: Oncology | Admitting: Oncology

## 2022-03-03 DIAGNOSIS — C50011 Malignant neoplasm of nipple and areola, right female breast: Secondary | ICD-10-CM | POA: Insufficient documentation

## 2022-03-03 DIAGNOSIS — C50812 Malignant neoplasm of overlapping sites of left female breast: Secondary | ICD-10-CM | POA: Insufficient documentation

## 2022-03-03 DIAGNOSIS — Z09 Encounter for follow-up examination after completed treatment for conditions other than malignant neoplasm: Secondary | ICD-10-CM | POA: Diagnosis present

## 2022-03-03 DIAGNOSIS — Z171 Estrogen receptor negative status [ER-]: Secondary | ICD-10-CM | POA: Insufficient documentation

## 2022-03-06 ENCOUNTER — Inpatient Hospital Stay: Payer: Medicare HMO

## 2022-03-06 ENCOUNTER — Inpatient Hospital Stay: Payer: Medicare HMO | Attending: Oncology

## 2022-03-06 VITALS — BP 99/65 | HR 86 | Temp 98.6°F | Resp 20

## 2022-03-06 DIAGNOSIS — C50011 Malignant neoplasm of nipple and areola, right female breast: Secondary | ICD-10-CM

## 2022-03-06 DIAGNOSIS — C50812 Malignant neoplasm of overlapping sites of left female breast: Secondary | ICD-10-CM | POA: Insufficient documentation

## 2022-03-06 DIAGNOSIS — T451X5A Adverse effect of antineoplastic and immunosuppressive drugs, initial encounter: Secondary | ICD-10-CM

## 2022-03-06 LAB — MAGNESIUM: Magnesium: 1.4 mg/dL — ABNORMAL LOW (ref 1.7–2.4)

## 2022-03-06 MED ORDER — HEPARIN SOD (PORK) LOCK FLUSH 100 UNIT/ML IV SOLN
500.0000 [IU] | Freq: Once | INTRAVENOUS | Status: AC
  Administered 2022-03-06: 500 [IU] via INTRAVENOUS
  Filled 2022-03-06: qty 5

## 2022-03-06 MED ORDER — SODIUM CHLORIDE 0.9 % IV SOLN
Freq: Once | INTRAVENOUS | Status: AC
  Filled 2022-03-06: qty 250

## 2022-03-06 MED ORDER — MAGNESIUM SULFATE 4 GM/100ML IV SOLN
4.0000 g | Freq: Once | INTRAVENOUS | Status: AC
  Administered 2022-03-06: 4 g via INTRAVENOUS
  Filled 2022-03-06: qty 100

## 2022-03-06 MED ORDER — SODIUM CHLORIDE 0.9% FLUSH
10.0000 mL | Freq: Once | INTRAVENOUS | Status: AC
  Administered 2022-03-06: 10 mL via INTRAVENOUS
  Filled 2022-03-06: qty 10

## 2022-03-10 ENCOUNTER — Other Ambulatory Visit: Payer: Self-pay | Admitting: General Surgery

## 2022-03-10 DIAGNOSIS — Z171 Estrogen receptor negative status [ER-]: Secondary | ICD-10-CM

## 2022-03-10 DIAGNOSIS — C50812 Malignant neoplasm of overlapping sites of left female breast: Secondary | ICD-10-CM

## 2022-03-11 ENCOUNTER — Telehealth: Payer: Self-pay

## 2022-03-11 ENCOUNTER — Ambulatory Visit: Payer: Self-pay | Admitting: General Surgery

## 2022-03-11 NOTE — Telephone Encounter (Signed)
-----   Message from Daiva Huge, RN sent at 03/11/2022  8:43 AM EST ----- Regarding: Follow up Good morning,   Dr. Tasia Catchings wants to see Ms. Raynor back 2 weeks after surgery, they got her surgery scheduled for 03/27/22.  Thanks,   [facility]

## 2022-03-11 NOTE — Telephone Encounter (Signed)
Vanessa Fisher can you schedule patient to see Dr. Tasia Catchings around March 8th since that will be 2 weeks post surgery and notify patient of date and time.

## 2022-03-11 NOTE — H&P (View-Only) (Signed)
PATIENT PROFILE: Vanessa Fisher is a 62 y.o. female who presents to the Clinic for evaluation of surgical intervention of breast cancer.  PCP:  Provider  HISTORY OF PRESENT ILLNESS: Vanessa Fisher was initially self palpated a mass on her left breast.  She was found with a large 5 cm mass on her left breast with abnormal axillary lymph node.  Biopsy showed invasive mammary carcinoma ER/PR negative, HER2 positive.  Biopsy of the axillary nodes were positive for metastatic disease.  Completed the send metastatic workup including MRI of the brain and PET scan was negative for distant metastatic disease.  It was decided to proceed with neoadjuvant chemotherapy.  She completed 6 cycle of TCHP.  She just missed her last cycle due to significant swelling and suspected allergic reaction.  Final diagnostic mammogram and breast ultrasound shows significant reduction of the mass of the left breast.  There is also reduction of the abnormal axillary lymph nodes.  Clinically unable to palpate the previously palpable mass on the lateral aspect of the left breast.  Today she comes to the office for discussion of surgical management of breast cancer status post neoadjuvant chemotherapy.  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, HER2 positive invasive mammary carcinoma  GENERAL REVIEW OF SYSTEMS:   General ROS: negative for - chills, fatigue, fever, weight gain or weight loss Allergy and Immunology ROS: negative for - hives  Hematological and Lymphatic ROS: negative for - bleeding problems or bruising, negative for palpable nodes Endocrine ROS: negative for - heat or cold intolerance, hair changes Respiratory ROS: negative for - cough, shortness of breath or wheezing Cardiovascular ROS: no chest pain or palpitations GI ROS: negative for nausea, vomiting, abdominal pain, diarrhea, constipation Musculoskeletal ROS: negative for - joint swelling or muscle pain Neurological ROS: negative for -  confusion, syncope Dermatological ROS: negative for pruritus and rash Psychiatric: negative for anxiety, depression, difficulty sleeping and memory loss  MEDICATIONS: Current Outpatient Medications  Medication Sig Dispense Refill   albuterol 90 mcg/actuation inhaler Inhale into the lungs     fluticasone propionate (FLONASE) 50 mcg/actuation nasal spray Place into one nostril once daily     glimepiride (AMARYL) 2 MG tablet Take 2 mg by mouth daily with breakfast     lidocaine-prilocaine (EMLA) cream Apply topically as needed     lisinopriL (ZESTRIL) 10 MG tablet Take 10 mg by mouth once daily     loratadine (CLARITIN) 10 mg tablet Take 1 tablet by mouth once daily     montelukast (SINGULAIR) 10 mg tablet Take 10 mg by mouth once daily     naloxone (NARCAN) 4 mg/actuation nasal spray as needed     ondansetron (ZOFRAN) 8 MG tablet Take 8 mg by mouth every 12 (twelve) hours as needed     oxyCODONE-acetaminophen (PERCOCET) 5-325 mg tablet Take 1 tablet by mouth every 6 (six) hours as needed for Pain 10 tablet 0   predniSONE (DELTASONE) 5 MG tablet Take 6 tabs today, then decrease by one tab daily - 5 tabs on day 2, 4 tabs on day 3, 3 tabs on day 4, 2 tabs on day 5, 1 tab on day 6. 21 tablet 0   VENTOLIN HFA 90 mcg/actuation inhaler      albuterol (PROVENTIL) 2.5 mg /3 mL (0.083 %) nebulizer solution Inhale 2.5 mg into the lungs every 4 (four) hours as needed     aspirin 81 MG EC tablet Take 81 mg by mouth once daily (Patient not taking:  Reported on 03/10/2022)     aspirin-calcium carbonate 81 mg-300 mg calcium(777 mg) Tab Take by mouth (Patient not taking: Reported on 09/18/2021)     fluconazole (DIFLUCAN) 150 MG tablet  (Patient not taking: Reported on 09/18/2021)     gabapentin (NEURONTIN) 400 MG capsule Take by mouth (Patient not taking: Reported on 09/18/2021)     pantoprazole (PROTONIX) 40 MG DR tablet Take by mouth (Patient not taking: Reported on [id number])     TRULICITY 4.5 99991111 mL  subcutaneous pen injector Inject 4.5 mg subcutaneously once a week (Patient not taking: Reported on 03/10/2022)     No current facility-administered medications for this visit.    ALLERGIES: Aspirin-salicylamide-caffeine, Gabapentin, Hydrocodone-acetaminophen, Penicillins, and Shellfish containing products  PAST MEDICAL HISTORY: Past Medical History:  Diagnosis Date   Asthma    Breast cancer (CMS-HCC) 2023   Dr Tasia Catchings   Diabetes mellitus without complication (CMS-HCC)    High blood pressure     PAST SURGICAL HISTORY: Past Surgical History:  Procedure Laterality Date   ABDOMINAL HYSTERECTOMY W/ PARTIAL VAGINACTOMY     around 2020   ankle surgery  Left    around 2016   ENDOSCOPIC CARPAL TUNNEL RELEASE Bilateral    around 2017     FAMILY HISTORY: Family History  Problem Relation Age of Onset   Pancreatic cancer Mother    Liver cancer Mother    Breast cancer Maternal Aunt    Breast cancer Maternal Aunt    Liver cancer Maternal Aunt    Diabetes Maternal Uncle    Stroke Maternal Grandmother    Myocardial Infarction (Heart attack) Maternal Grandmother    Liver cancer Son    Diabetes Son      SOCIAL HISTORY: Social History   Socioeconomic History   Marital status: Single  Tobacco Use   Smoking status: Never    Passive exposure: Never   Smokeless tobacco: Never  Vaping Use   Vaping Use: Never used  Substance and Sexual Activity   Alcohol use: Not Currently   Drug use: Never    PHYSICAL EXAM: Vitals:   03/10/22 0953  BP: 101/59  Pulse: 83   Body mass index is 34.38 kg/m. Weight: 96.6 kg (213 lb)   GENERAL: Alert, active, oriented x3  HEENT: Pupils equal reactive to light. Extraocular movements are intact. Sclera clear. Palpebral conjunctiva normal red color.Pharynx clear.  NECK: Supple with no palpable mass and no adenopathy.  LUNGS: Sound clear with no rales rhonchi or wheezes.  HEART: Regular rhythm S1 and S2 without murmur.  BREAST: breasts appear  normal, no suspicious masses, no skin or nipple changes or axillary nodes.  ABDOMEN: Soft and depressible, nontender with no palpable mass, no hepatomegaly.  EXTREMITIES: Well-developed well-nourished symmetrical with no dependent edema.  NEUROLOGICAL: Awake alert oriented, facial expression symmetrical, moving all extremities.  REVIEW OF DATA: I have reviewed the following data today: No visits with results within 3 Month(s) from this visit.  Latest known visit with results is:  Urgent Care Visit on 04/01/2018  Component Date Value   Sodium 04/01/2018 139    Potassium 04/01/2018 3.8    Chloride 04/01/2018 107    Carbon Dioxide (CO2) 04/01/2018 25    Urea Nitrogen (BUN) 04/01/2018 10    Creatinine 04/01/2018 0.8    Glucose 04/01/2018 84    Calcium 04/01/2018 9.4    Anion Gap 04/01/2018 7    BUN/CREA Ratio 04/01/2018 13    Glomerular Filtration Ra* 04/01/2018 96    Magnesium  04/01/2018 1.8    Glucose-Whole Blood 04/01/2018 87     EXAM:  DIGITAL DIAGNOSTIC UNILATERAL LEFT MAMMOGRAM WITH TOMOSYNTHESIS;  ULTRASOUND LEFT BREAST LIMITED   TECHNIQUE:  Left digital diagnostic mammography and breast tomosynthesis was  performed.; Targeted ultrasound examination of the left breast was  performed.   COMPARISON: Previous exams   ACR Breast Density Category b: There are scattered areas of  fibroglandular density.   FINDINGS:  Within the LATERAL portion of the LEFT breast, there are pleomorphic  calcifications associated tissue marker clip. The calcifications  currently span 1.4 x 2.2 x 1.5 centimeters. Previously,  calcifications spanned 3.0 x 1.6 x 1.3 centimeters. Additionally,  there has been significant improvement in asymmetry throughout the  central and LATERAL portions of the LEFT breast. No new or  suspicious findings.   On physical exam, I palpate soft thickening in the LATERAL portion  of the LEFT breast, not associated with discrete mass.   Targeted ultrasound is  performed, showing an irregular heterogeneous  hypo echoic mass in the 3 o'clock location of the LEFT breast 6  centimeters from the nipple, now measuring approximately 3.4 x 0.9 x  8 centimeters (extending from the 1 o'clock through the 4 o'clock  locations).   Evaluation of the LEFT axilla shows a single mildly prominent lymph  node with cortical thickening of 3.4 millimeters, previously 5.5  millimeters. No other enlarged level I or level II lymph nodes are  identified.   IMPRESSION:  Significant improvement to neoadjuvant treatment.   LEFT breast mass is smaller.   Mildly prominent single LEFT axillary lymph node, with interval  resolution of other enlarged LEFT axillary lymph nodes.   RECOMMENDATION:  Treatment plan for known LEFT breast malignancy.   I have discussed the findings and recommendations with the patient.  If applicable, a reminder letter will be sent to the patient  regarding the next appointment.   BI-RADS CATEGORY  6: Known biopsy-proven malignancy.   ASSESSMENT: Ms. Rozeboom is a 62 y.o. female presenting for surgical discussion for left breast cancer.    Patient was oriented again about the pathology results.  We went through the new diagnostic mammogram and ultrasound does show significant decrease of the size of the previous mass.  This should be a sign of adequate response to neoadjuvant chemotherapy.  I personally evaluated the images of the diagnostic mammogram and breast ultrasound.  Surgical alternatives were discussed with patient including partial vs total mastectomy. Surgical technique and post operative care was discussed with patient. Risk of surgery was discussed with patient including but not limited to: wound infection, seroma, hematoma, brachial plexopathy, mondor's disease (thrombosis of small veins of breast), chronic wound pain, breast lymphedema, altered sensation to the nipple and cosmesis among others.   Due to the decreasing size and  expected adequate response to neoadjuvant chemotherapy patient would like to proceed with partial mastectomy.  We discussed about the risks of positive margins and need of reintervention if margins are positive.  Will also discuss about sentinel lymph node biopsy with excision of the previously positive biopsy lymph nodes.  We also discussed about possible axillary node dissection if frozen section shows persistently positive disease in the axillary lymph node.  Malignant neoplasm of overlapping sites of left breast in female, estrogen receptor negative  [C50.812, Z17.1]  PLAN: Left breast radiofrequency tagged partial mastectomy with axillary sentinel lymph node biopsy and tagged axillary lymph node biopsy. ([id number], [id number], [id number]) Hold aspirin 5 days before the surgery  Contact us if you have any concern.   Patient verbalized understanding, all questions were answered, and were agreeable with the plan outlined above.     Herbert Pun, MD  Electronically signed by Herbert Pun, MD

## 2022-03-11 NOTE — H&P (Signed)
PATIENT PROFILE: Vanessa Fisher is a 61 y.o. female who presents to the Clinic for evaluation of surgical intervention of breast cancer.  PCP:  Provider  HISTORY OF PRESENT ILLNESS: Vanessa Fisher was initially self palpated a mass on her left breast.  She was found with a large 5 cm mass on her left breast with abnormal axillary lymph node.  Biopsy showed invasive mammary carcinoma ER/PR negative, HER2 positive.  Biopsy of the axillary nodes were positive for metastatic disease.  Completed the send metastatic workup including MRI of the brain and PET scan was negative for distant metastatic disease.  It was decided to proceed with neoadjuvant chemotherapy.  She completed 6 cycle of TCHP.  She just missed her last cycle due to significant swelling and suspected allergic reaction.  Final diagnostic mammogram and breast ultrasound shows significant reduction of the mass of the left breast.  There is also reduction of the abnormal axillary lymph nodes.  Clinically unable to palpate the previously palpable mass on the lateral aspect of the left breast.  Today she comes to the office for discussion of surgical management of breast cancer status post neoadjuvant chemotherapy.  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, HER2 positive invasive mammary carcinoma  GENERAL REVIEW OF SYSTEMS:   General ROS: negative for - chills, fatigue, fever, weight gain or weight loss Allergy and Immunology ROS: negative for - hives  Hematological and Lymphatic ROS: negative for - bleeding problems or bruising, negative for palpable nodes Endocrine ROS: negative for - heat or cold intolerance, hair changes Respiratory ROS: negative for - cough, shortness of breath or wheezing Cardiovascular ROS: no chest pain or palpitations GI ROS: negative for nausea, vomiting, abdominal pain, diarrhea, constipation Musculoskeletal ROS: negative for - joint swelling or muscle pain Neurological ROS: negative for -  confusion, syncope Dermatological ROS: negative for pruritus and rash Psychiatric: negative for anxiety, depression, difficulty sleeping and memory loss  MEDICATIONS: Current Outpatient Medications  Medication Sig Dispense Refill   albuterol 90 mcg/actuation inhaler Inhale into the lungs     fluticasone propionate (FLONASE) 50 mcg/actuation nasal spray Place into one nostril once daily     glimepiride (AMARYL) 2 MG tablet Take 2 mg by mouth daily with breakfast     lidocaine-prilocaine (EMLA) cream Apply topically as needed     lisinopriL (ZESTRIL) 10 MG tablet Take 10 mg by mouth once daily     loratadine (CLARITIN) 10 mg tablet Take 1 tablet by mouth once daily     montelukast (SINGULAIR) 10 mg tablet Take 10 mg by mouth once daily     naloxone (NARCAN) 4 mg/actuation nasal spray as needed     ondansetron (ZOFRAN) 8 MG tablet Take 8 mg by mouth every 12 (twelve) hours as needed     oxyCODONE-acetaminophen (PERCOCET) 5-325 mg tablet Take 1 tablet by mouth every 6 (six) hours as needed for Pain 10 tablet 0   predniSONE (DELTASONE) 5 MG tablet Take 6 tabs today, then decrease by one tab daily - 5 tabs on day 2, 4 tabs on day 3, 3 tabs on day 4, 2 tabs on day 5, 1 tab on day 6. 21 tablet 0   VENTOLIN HFA 90 mcg/actuation inhaler      albuterol (PROVENTIL) 2.5 mg /3 mL (0.083 %) nebulizer solution Inhale 2.5 mg into the lungs every 4 (four) hours as needed     aspirin 81 MG EC tablet Take 81 mg by mouth once daily (Patient not taking:   Reported on 03/10/2022)     aspirin-calcium carbonate 81 mg-300 mg calcium(777 mg) Tab Take by mouth (Patient not taking: Reported on 09/18/2021)     fluconazole (DIFLUCAN) 150 MG tablet  (Patient not taking: Reported on 09/18/2021)     gabapentin (NEURONTIN) 400 MG capsule Take by mouth (Patient not taking: Reported on 09/18/2021)     pantoprazole (PROTONIX) 40 MG DR tablet Take by mouth (Patient not taking: Reported on 03/10/2022)     TRULICITY 4.5 mg/0.5 mL  subcutaneous pen injector Inject 4.5 mg subcutaneously once a week (Patient not taking: Reported on 03/10/2022)     No current facility-administered medications for this visit.    ALLERGIES: Aspirin-salicylamide-caffeine, Gabapentin, Hydrocodone-acetaminophen, Penicillins, and Shellfish containing products  PAST MEDICAL HISTORY: Past Medical History:  Diagnosis Date   Asthma    Breast cancer (CMS-HCC) 2023   Dr Yu   Diabetes mellitus without complication (CMS-HCC)    High blood pressure     PAST SURGICAL HISTORY: Past Surgical History:  Procedure Laterality Date   ABDOMINAL HYSTERECTOMY W/ PARTIAL VAGINACTOMY     around 2020   ankle surgery  Left    around 2016   ENDOSCOPIC CARPAL TUNNEL RELEASE Bilateral    around 2017     FAMILY HISTORY: Family History  Problem Relation Age of Onset   Pancreatic cancer Mother    Liver cancer Mother    Breast cancer Maternal Aunt    Breast cancer Maternal Aunt    Liver cancer Maternal Aunt    Diabetes Maternal Uncle    Stroke Maternal Grandmother    Myocardial Infarction (Heart attack) Maternal Grandmother    Liver cancer Son    Diabetes Son      SOCIAL HISTORY: Social History   Socioeconomic History   Marital status: Single  Tobacco Use   Smoking status: Never    Passive exposure: Never   Smokeless tobacco: Never  Vaping Use   Vaping Use: Never used  Substance and Sexual Activity   Alcohol use: Not Currently   Drug use: Never    PHYSICAL EXAM: Vitals:   03/10/22 0953  BP: 101/59  Pulse: 83   Body mass index is 34.38 kg/m. Weight: 96.6 kg (213 lb)   GENERAL: Alert, active, oriented x3  HEENT: Pupils equal reactive to light. Extraocular movements are intact. Sclera clear. Palpebral conjunctiva normal red color.Pharynx clear.  NECK: Supple with no palpable mass and no adenopathy.  LUNGS: Sound clear with no rales rhonchi or wheezes.  HEART: Regular rhythm S1 and S2 without murmur.  BREAST: breasts appear  normal, no suspicious masses, no skin or nipple changes or axillary nodes.  ABDOMEN: Soft and depressible, nontender with no palpable mass, no hepatomegaly.  EXTREMITIES: Well-developed well-nourished symmetrical with no dependent edema.  NEUROLOGICAL: Awake alert oriented, facial expression symmetrical, moving all extremities.  REVIEW OF DATA: I have reviewed the following data today: No visits with results within 3 Month(s) from this visit.  Latest known visit with results is:  Urgent Care Visit on 04/01/2018  Component Date Value   Sodium 04/01/2018 139    Potassium 04/01/2018 3.8    Chloride 04/01/2018 107    Carbon Dioxide (CO2) 04/01/2018 25    Urea Nitrogen (BUN) 04/01/2018 10    Creatinine 04/01/2018 0.8    Glucose 04/01/2018 84    Calcium 04/01/2018 9.4    Anion Gap 04/01/2018 7    BUN/CREA Ratio 04/01/2018 13    Glomerular Filtration Ra* 04/01/2018 96    Magnesium   04/01/2018 1.8    Glucose-Whole Blood 04/01/2018 87     EXAM:  DIGITAL DIAGNOSTIC UNILATERAL LEFT MAMMOGRAM WITH TOMOSYNTHESIS;  ULTRASOUND LEFT BREAST LIMITED   TECHNIQUE:  Left digital diagnostic mammography and breast tomosynthesis was  performed.; Targeted ultrasound examination of the left breast was  performed.   COMPARISON: Previous exams   ACR Breast Density Category b: There are scattered areas of  fibroglandular density.   FINDINGS:  Within the LATERAL portion of the LEFT breast, there are pleomorphic  calcifications associated tissue marker clip. The calcifications  currently span 1.4 x 2.2 x 1.5 centimeters. Previously,  calcifications spanned 3.0 x 1.6 x 1.3 centimeters. Additionally,  there has been significant improvement in asymmetry throughout the  central and LATERAL portions of the LEFT breast. No new or  suspicious findings.   On physical exam, I palpate soft thickening in the LATERAL portion  of the LEFT breast, not associated with discrete mass.   Targeted ultrasound is  performed, showing an irregular heterogeneous  hypo echoic mass in the 3 o'clock location of the LEFT breast 6  centimeters from the nipple, now measuring approximately 3.4 x 0.9 x  8 centimeters (extending from the 1 o'clock through the 4 o'clock  locations).   Evaluation of the LEFT axilla shows a single mildly prominent lymph  node with cortical thickening of 3.4 millimeters, previously 5.5  millimeters. No other enlarged level I or level II lymph nodes are  identified.   IMPRESSION:  Significant improvement to neoadjuvant treatment.   LEFT breast mass is smaller.   Mildly prominent single LEFT axillary lymph node, with interval  resolution of other enlarged LEFT axillary lymph nodes.   RECOMMENDATION:  Treatment plan for known LEFT breast malignancy.   I have discussed the findings and recommendations with the patient.  If applicable, a reminder letter will be sent to the patient  regarding the next appointment.   BI-RADS CATEGORY  6: Known biopsy-proven malignancy.   ASSESSMENT: Vanessa Fisher is a 61 y.o. female presenting for surgical discussion for left breast cancer.    Patient was oriented again about the pathology results.  We went through the new diagnostic mammogram and ultrasound does show significant decrease of the size of the previous mass.  This should be a sign of adequate response to neoadjuvant chemotherapy.  I personally evaluated the images of the diagnostic mammogram and breast ultrasound.  Surgical alternatives were discussed with patient including partial vs total mastectomy. Surgical technique and post operative care was discussed with patient. Risk of surgery was discussed with patient including but not limited to: wound infection, seroma, hematoma, brachial plexopathy, mondor's disease (thrombosis of small veins of breast), chronic wound pain, breast lymphedema, altered sensation to the nipple and cosmesis among others.   Due to the decreasing size and  expected adequate response to neoadjuvant chemotherapy patient would like to proceed with partial mastectomy.  We discussed about the risks of positive margins and need of reintervention if margins are positive.  Will also discuss about sentinel lymph node biopsy with excision of the previously positive biopsy lymph nodes.  We also discussed about possible axillary node dissection if frozen section shows persistently positive disease in the axillary lymph node.  Malignant neoplasm of overlapping sites of left breast in female, estrogen receptor negative  [C50.812, Z17.1]  PLAN: Left breast radiofrequency tagged partial mastectomy with axillary sentinel lymph node biopsy and tagged axillary lymph node biopsy. ([id number], [id number]) Hold aspirin 5 days before the surgery    Contact us if you have any concern.   Patient verbalized understanding, all questions were answered, and were agreeable with the plan outlined above.     Rendell Thivierge Cintron-Diaz, MD  Electronically signed by Jamicia Haaland Cintron-Diaz, MD  

## 2022-03-12 ENCOUNTER — Ambulatory Visit
Admission: RE | Admit: 2022-03-12 | Discharge: 2022-03-12 | Disposition: A | Discharge status: Home / Self Care | Payer: Medicare HMO | Source: Ambulatory Visit | Attending: General Surgery | Admitting: General Surgery

## 2022-03-12 ENCOUNTER — Ambulatory Visit
Admission: RE | Admit: 2022-03-12 | Discharge: 2022-03-12 | Disposition: A | Discharge status: Home / Self Care | Payer: Medicare HMO | Source: Ambulatory Visit

## 2022-03-12 ENCOUNTER — Other Ambulatory Visit: Payer: Self-pay

## 2022-03-12 ENCOUNTER — Other Ambulatory Visit: Payer: Medicare HMO

## 2022-03-12 ENCOUNTER — Encounter: Payer: Self-pay | Admitting: General Surgery

## 2022-03-12 ENCOUNTER — Other Ambulatory Visit: Payer: Self-pay | Admitting: General Surgery

## 2022-03-12 DIAGNOSIS — Z171 Estrogen receptor negative status [ER-]: Secondary | ICD-10-CM | POA: Diagnosis present

## 2022-03-12 DIAGNOSIS — C50011 Malignant neoplasm of nipple and areola, right female breast: Secondary | ICD-10-CM

## 2022-03-12 DIAGNOSIS — C50812 Malignant neoplasm of overlapping sites of left female breast: Secondary | ICD-10-CM | POA: Diagnosis present

## 2022-03-12 HISTORY — PX: BREAST BIOPSY: SHX20

## 2022-03-12 MED ORDER — LIDOCAINE HCL (PF) 1 % IJ SOLN
5.0000 mL | Freq: Once | INTRAMUSCULAR | Status: AC
  Administered 2022-03-12: 5 mL
  Filled 2022-03-12: qty 5

## 2022-03-12 MED ORDER — LIDOCAINE HCL (PF) 1 % IJ SOLN
5.0000 mL | Freq: Once | INTRAMUSCULAR | Status: AC
  Administered 2022-03-12: 5 mL

## 2022-03-13 ENCOUNTER — Inpatient Hospital Stay: Payer: Medicare HMO

## 2022-03-13 VITALS — BP 111/67 | HR 69 | Temp 96.8°F | Resp 20

## 2022-03-13 DIAGNOSIS — C50812 Malignant neoplasm of overlapping sites of left female breast: Secondary | ICD-10-CM | POA: Diagnosis not present

## 2022-03-13 DIAGNOSIS — T451X5A Adverse effect of antineoplastic and immunosuppressive drugs, initial encounter: Secondary | ICD-10-CM

## 2022-03-13 DIAGNOSIS — K521 Toxic gastroenteritis and colitis: Secondary | ICD-10-CM

## 2022-03-13 DIAGNOSIS — C50011 Malignant neoplasm of nipple and areola, right female breast: Secondary | ICD-10-CM

## 2022-03-13 LAB — MAGNESIUM: Magnesium: 1.6 mg/dL — ABNORMAL LOW (ref 1.7–2.4)

## 2022-03-13 MED ORDER — MAGNESIUM SULFATE 2 GM/50ML IV SOLN
2.0000 g | Freq: Once | INTRAVENOUS | Status: AC
  Administered 2022-03-13: 2 g via INTRAVENOUS
  Filled 2022-03-13: qty 50

## 2022-03-13 MED ORDER — HEPARIN SOD (PORK) LOCK FLUSH 100 UNIT/ML IV SOLN
500.0000 [IU] | Freq: Once | INTRAVENOUS | Status: AC
  Administered 2022-03-13: 500 [IU] via INTRAVENOUS
  Filled 2022-03-13: qty 5

## 2022-03-13 MED ORDER — SODIUM CHLORIDE 0.9% FLUSH
10.0000 mL | Freq: Once | INTRAVENOUS | Status: AC
  Administered 2022-03-13: 10 mL via INTRAVENOUS
  Filled 2022-03-13: qty 10

## 2022-03-13 MED ORDER — SODIUM CHLORIDE 0.9 % IV SOLN
Freq: Once | INTRAVENOUS | Status: AC
  Filled 2022-03-13: qty 250

## 2022-03-16 ENCOUNTER — Ambulatory Visit: Payer: Medicare HMO | Admitting: Gastroenterology

## 2022-03-18 ENCOUNTER — Encounter
Admission: RE | Admit: 2022-03-18 | Discharge: 2022-03-18 | Disposition: A | Discharge status: Home / Self Care | Payer: Medicare HMO | Source: Ambulatory Visit | Attending: General Surgery | Admitting: General Surgery

## 2022-03-18 ENCOUNTER — Other Ambulatory Visit: Payer: Self-pay

## 2022-03-18 DIAGNOSIS — Z01812 Encounter for preprocedural laboratory examination: Secondary | ICD-10-CM

## 2022-03-18 NOTE — Patient Instructions (Addendum)
Your procedure is scheduled on: 03/27/22 - Friday Report to the Registration Desk on the 1st floor of the [facility]. To find out your arrival time, please call [phone] between 1PM - 3PM on: 03/26/21 - Thursday If your arrival time is 6:00 am, do not arrive before that time as the [facility] entrance doors do not open until 6:00 am.  REMEMBER: Instructions that are not followed completely may result in serious medical risk, up to and including death; or upon the discretion of your surgeon and anesthesiologist your surgery may need to be rescheduled.  Do not eat food or drink any liquids after midnight the night before surgery.  No gum chewing or hard candies.  One week prior to surgery: Stop Anti-inflammatories (NSAIDS) such as Advil, Aleve, Ibuprofen, Motrin, Naproxen, Naprosyn and Aspirin based products such as Excedrin, Goody's Powder, BC Powder.  Stop ANY OVER THE COUNTER supplements until after surgery.  You may however, continue to take Tylenol if needed for pain up until the day of surgery.   TAKE ONLY THESE MEDICATIONS THE MORNING OF SURGERY WITH A SIP OF WATER:  esomeprazole ([place]) - (take one the night before and one on the morning of surgery - helps to prevent nausea after surgery.) FLOVENT DISKUS  fluticasone (FLONASE)   Use albuterol (VENTOLIN HFA)  on the day of surgery and bring to the hospital.   No Alcohol for 24 hours before or after surgery.  No Smoking including e-cigarettes for 24 hours before surgery.  No chewable tobacco products for at least 6 hours before surgery.  No nicotine patches on the day of surgery.  Do not use any "recreational" drugs for at least a week (preferably 2 weeks) before your surgery.  Please be advised that the combination of cocaine and anesthesia may have negative outcomes, up to and including death. If you test positive for cocaine, your surgery will be cancelled.  On the morning of surgery brush your teeth with  toothpaste and water, you may rinse your mouth with mouthwash if you wish. Do not swallow any toothpaste or mouthwash.  Use CHG Soap or wipes as directed on instruction sheet.  Do not wear jewelry, make-up, hairpins, clips or nail polish.  Do not wear lotions, powders, or perfumes.   Do not shave body hair from the neck down 48 hours before surgery.  Contact lenses, hearing aids and dentures may not be worn into surgery.  Do not bring valuables to the hospital. [facility] is not responsible for any missing/lost belongings or valuables.   Notify your doctor if there is any change in your medical condition (cold, fever, infection).  Wear comfortable clothing (specific to your surgery type) to the hospital.  After surgery, you can help prevent lung complications by doing breathing exercises.  Take deep breaths and cough every 1-2 hours. Your doctor may order a device called an Incentive Spirometer to help you take deep breaths. When coughing or sneezing, hold a pillow firmly against your incision with both hands. This is called "splinting." Doing this helps protect your incision. It also decreases belly discomfort.  If you are being admitted to the hospital overnight, leave your suitcase in the car. After surgery it may be brought to your room.  In case of increased patient census, it may be necessary for you, the patient, to continue your postoperative care in the Same Day Surgery department.  If you are being discharged the day of surgery, you will not be allowed to drive  home. You will need a responsible individual to drive you home and stay with you for 24 hours after surgery.   If you are taking public transportation, you will need to have a responsible individual with you.  Please call the [facility] at [phone] if you have any questions about these instructions.  Surgery Visitation Policy:  Patients undergoing a surgery or procedure may have two family  members or support persons with them as long as the person is not COVID-19 positive or experiencing its symptoms.   Inpatient Visitation:    Visiting hours are 7 a.m. to 8 p.m. Up to four visitors are allowed at one time in a patient room. The visitors may rotate out with other people during the day. One designated support person (adult) may remain overnight.  Due to an increase in RSV and influenza rates and associated hospitalizations, children ages 65 and under will not be able to visit patients in [facility].  Masks continue to be strongly recommended.    Preparing for Surgery with CHLORHEXIDINE GLUCONATE (CHG) Soap  Chlorhexidine Gluconate (CHG) Soap  o An antiseptic cleaner that kills germs and bonds with the skin to continue killing germs even after washing  o Used for showering the night before surgery and morning of surgery  Before surgery, you can play an important role by reducing the number of germs on your skin.  CHG (Chlorhexidine gluconate) soap is an antiseptic cleanser which kills germs and bonds with the skin to continue killing germs even after washing.  Please do not use if you have an allergy to CHG or antibacterial soaps. If your skin becomes reddened/irritated stop using the CHG.  1. Shower the NIGHT BEFORE SURGERY and the MORNING OF SURGERY with CHG soap.  2. If you choose to wash your hair, wash your hair first as usual with your normal shampoo.  3. After shampooing, rinse your hair and body thoroughly to remove the shampoo.  4. Use CHG as you would any other liquid soap. You can apply CHG directly to the skin and wash gently with a scrungie or a clean washcloth.  5. Apply the CHG soap to your body only from the neck down. Do not use on open wounds or open sores. Avoid contact with your eyes, ears, mouth, and genitals (private parts). Wash face and genitals (private parts) with your normal soap.  6. Wash thoroughly, paying special attention to  the area where your surgery will be performed.  7. Thoroughly rinse your body with warm water.  8. Do not shower/wash with your normal soap after using and rinsing off the CHG soap.  9. Pat yourself dry with a clean towel.  10. Wear clean pajamas to bed the night before surgery.  12. Place clean sheets on your bed the night of your first shower and do not sleep with pets.  13. Shower again with the CHG soap on the day of surgery prior to arriving at the hospital.  14. Do not apply any deodorants/lotions/powders.  15. Please wear clean clothes to the hospital.

## 2022-03-20 ENCOUNTER — Encounter
Admission: RE | Admit: 2022-03-20 | Discharge: 2022-03-20 | Disposition: A | Discharge status: Home / Self Care | Payer: Medicare HMO | Source: Ambulatory Visit | Attending: General Surgery | Admitting: General Surgery

## 2022-03-20 ENCOUNTER — Inpatient Hospital Stay: Payer: Medicare HMO

## 2022-03-20 VITALS — BP 117/57 | HR 67 | Temp 97.3°F | Resp 20

## 2022-03-20 DIAGNOSIS — Z01812 Encounter for preprocedural laboratory examination: Secondary | ICD-10-CM

## 2022-03-20 DIAGNOSIS — Z01818 Encounter for other preprocedural examination: Secondary | ICD-10-CM | POA: Insufficient documentation

## 2022-03-20 DIAGNOSIS — C50812 Malignant neoplasm of overlapping sites of left female breast: Secondary | ICD-10-CM | POA: Diagnosis not present

## 2022-03-20 DIAGNOSIS — C50011 Malignant neoplasm of nipple and areola, right female breast: Secondary | ICD-10-CM

## 2022-03-20 DIAGNOSIS — K521 Toxic gastroenteritis and colitis: Secondary | ICD-10-CM

## 2022-03-20 LAB — CBC
HCT: 28 % — ABNORMAL LOW (ref 36.0–46.0)
Hemoglobin: 8.7 g/dL — ABNORMAL LOW (ref 12.0–15.0)
MCH: 28.5 pg (ref 26.0–34.0)
MCHC: 31.1 g/dL (ref 30.0–36.0)
MCV: 91.8 fL (ref 80.0–100.0)
Platelets: 262 10*3/uL (ref 150–400)
RBC: 3.05 MIL/uL — ABNORMAL LOW (ref 3.87–5.11)
RDW: 17.1 % — ABNORMAL HIGH (ref 11.5–15.5)
WBC: 4.7 10*3/uL (ref 4.0–10.5)
nRBC: 0 % (ref 0.0–0.2)

## 2022-03-20 LAB — MAGNESIUM: Magnesium: 1.3 mg/dL — ABNORMAL LOW (ref 1.7–2.4)

## 2022-03-20 MED ORDER — MAGNESIUM SULFATE 4 GM/100ML IV SOLN
4.0000 g | Freq: Once | INTRAVENOUS | Status: AC
  Administered 2022-03-20: 4 g via INTRAVENOUS
  Filled 2022-03-20: qty 100

## 2022-03-20 MED ORDER — HEPARIN SOD (PORK) LOCK FLUSH 100 UNIT/ML IV SOLN
500.0000 [IU] | Freq: Once | INTRAVENOUS | Status: AC | PRN
  Administered 2022-03-20: 500 [IU]
  Filled 2022-03-20: qty 5

## 2022-03-20 MED ORDER — SODIUM CHLORIDE 0.9 % IV SOLN
Freq: Once | INTRAVENOUS | Status: AC
  Filled 2022-03-20: qty 250

## 2022-03-20 MED ORDER — SODIUM CHLORIDE 0.9% FLUSH
10.0000 mL | Freq: Once | INTRAVENOUS | Status: AC | PRN
  Administered 2022-03-20: 10 mL
  Filled 2022-03-20: qty 10

## 2022-03-22 ENCOUNTER — Other Ambulatory Visit: Payer: Self-pay | Admitting: Oncology

## 2022-03-22 DIAGNOSIS — E876 Hypokalemia: Secondary | ICD-10-CM

## 2022-03-23 NOTE — Telephone Encounter (Signed)
  Component Ref Range & Units 4 wk ago (02/23/22) 1 mo ago (02/20/22) 1 mo ago (02/17/22) 1 mo ago (02/11/22) 1 mo ago (02/09/22) 1 mo ago (02/06/22) 1 mo ago (02/05/22)  Potassium 3.5 - 5.1 mmol/L 3.5 3.6 3.5 4.5 4.2 3.3 Low  3.1 Low

## 2022-03-24 ENCOUNTER — Other Ambulatory Visit: Payer: Self-pay | Admitting: General Surgery

## 2022-03-24 DIAGNOSIS — N644 Mastodynia: Secondary | ICD-10-CM

## 2022-03-27 ENCOUNTER — Encounter
Admission: RE | Admit: 2022-03-27 | Discharge: 2022-03-27 | Disposition: A | Discharge status: Home / Self Care | Payer: Medicare HMO | Source: Ambulatory Visit | Attending: General Surgery | Admitting: General Surgery

## 2022-03-27 ENCOUNTER — Ambulatory Visit: Payer: Medicare HMO | Admitting: Urgent Care

## 2022-03-27 ENCOUNTER — Ambulatory Visit: Payer: Medicare HMO | Admitting: Anesthesiology

## 2022-03-27 ENCOUNTER — Encounter: Payer: Self-pay | Admitting: General Surgery

## 2022-03-27 ENCOUNTER — Ambulatory Visit
Admission: RE | Admit: 2022-03-27 | Discharge: 2022-03-27 | Disposition: A | Discharge status: Home / Self Care | Payer: Medicare HMO | Attending: General Surgery | Admitting: General Surgery

## 2022-03-27 ENCOUNTER — Encounter
Admission: RE | Disposition: A | Discharge status: Home / Self Care | Payer: Self-pay | Source: Home / Self Care | Attending: General Surgery

## 2022-03-27 ENCOUNTER — Other Ambulatory Visit: Payer: Self-pay

## 2022-03-27 ENCOUNTER — Ambulatory Visit
Admission: RE | Admit: 2022-03-27 | Discharge: 2022-03-27 | Disposition: A | Discharge status: Home / Self Care | Payer: Medicare HMO | Source: Ambulatory Visit | Attending: General Surgery | Admitting: General Surgery

## 2022-03-27 DIAGNOSIS — Z171 Estrogen receptor negative status [ER-]: Secondary | ICD-10-CM

## 2022-03-27 DIAGNOSIS — E669 Obesity, unspecified: Secondary | ICD-10-CM | POA: Diagnosis not present

## 2022-03-27 DIAGNOSIS — Z6834 Body mass index (BMI) 34.0-34.9, adult: Secondary | ICD-10-CM | POA: Diagnosis not present

## 2022-03-27 DIAGNOSIS — J45909 Unspecified asthma, uncomplicated: Secondary | ICD-10-CM | POA: Diagnosis not present

## 2022-03-27 DIAGNOSIS — Z01812 Encounter for preprocedural laboratory examination: Secondary | ICD-10-CM

## 2022-03-27 DIAGNOSIS — N6032 Fibrosclerosis of left breast: Secondary | ICD-10-CM | POA: Insufficient documentation

## 2022-03-27 DIAGNOSIS — Z803 Family history of malignant neoplasm of breast: Secondary | ICD-10-CM | POA: Insufficient documentation

## 2022-03-27 DIAGNOSIS — C50812 Malignant neoplasm of overlapping sites of left female breast: Secondary | ICD-10-CM | POA: Diagnosis present

## 2022-03-27 DIAGNOSIS — E119 Type 2 diabetes mellitus without complications: Secondary | ICD-10-CM | POA: Insufficient documentation

## 2022-03-27 DIAGNOSIS — Z800 Family history of malignant neoplasm of digestive organs: Secondary | ICD-10-CM | POA: Diagnosis not present

## 2022-03-27 HISTORY — PX: BREAST LUMPECTOMY: SHX2

## 2022-03-27 HISTORY — PX: PART MASTECTOMY,RADIO FREQUENCY LOCALIZER,AXILLARY SENTINEL NODE BIOPSY: SHX6901

## 2022-03-27 LAB — GLUCOSE, CAPILLARY
Glucose-Capillary: 92 mg/dL (ref 70–99)
Glucose-Capillary: 123 mg/dL — ABNORMAL HIGH (ref 70–99)

## 2022-03-27 SURGERY — PART MASTECTOMY,RADIO FREQUENCY LOCALIZER,AXILLARY SENTINEL NODE BIOPSY
Anesthesia: General | Site: Breast | Laterality: Left

## 2022-03-27 MED ORDER — KETAMINE HCL 50 MG/5ML IJ SOSY
PREFILLED_SYRINGE | INTRAMUSCULAR | Status: AC
  Filled 2022-03-27: qty 5

## 2022-03-27 MED ORDER — CHLORHEXIDINE GLUCONATE 0.12 % MT SOLN
OROMUCOSAL | Status: AC
  Administered 2022-03-27: 15 mL via OROMUCOSAL
  Filled 2022-03-27: qty 15

## 2022-03-27 MED ORDER — HEMOSTATIC AGENTS (NO CHARGE) OPTIME
TOPICAL | Status: DC | PRN
  Administered 2022-03-27: 1 via TOPICAL

## 2022-03-27 MED ORDER — FENTANYL CITRATE (PF) 100 MCG/2ML IJ SOLN
INTRAMUSCULAR | Status: DC | PRN
  Administered 2022-03-27 (×2): 25 ug via INTRAVENOUS
  Administered 2022-03-27: 50 ug via INTRAVENOUS

## 2022-03-27 MED ORDER — GLYCOPYRROLATE 0.2 MG/ML IJ SOLN
INTRAMUSCULAR | Status: AC
  Filled 2022-03-27: qty 1

## 2022-03-27 MED ORDER — STERILE WATER FOR IRRIGATION IR SOLN
Status: DC | PRN
  Administered 2022-03-27: 500 mL

## 2022-03-27 MED ORDER — CHLORHEXIDINE GLUCONATE 0.12 % MT SOLN: 15.0000 mL | Freq: Once | OROMUCOSAL | Status: AC

## 2022-03-27 MED ORDER — EPHEDRINE 5 MG/ML INJ
INTRAVENOUS | Status: AC
  Filled 2022-03-27: qty 5

## 2022-03-27 MED ORDER — PROPOFOL 10 MG/ML IV BOLUS
INTRAVENOUS | Status: DC | PRN
  Administered 2022-03-27: 120 mg via INTRAVENOUS

## 2022-03-27 MED ORDER — MIDAZOLAM HCL 2 MG/2ML IJ SOLN
INTRAMUSCULAR | Status: AC
  Filled 2022-03-27: qty 2

## 2022-03-27 MED ORDER — MIDAZOLAM HCL 2 MG/2ML IJ SOLN
INTRAMUSCULAR | Status: DC | PRN
  Administered 2022-03-27: 2 mg via INTRAVENOUS

## 2022-03-27 MED ORDER — HEPARIN SODIUM (PORCINE) 5000 UNIT/ML IJ SOLN
INTRAMUSCULAR | Status: AC
  Administered 2022-03-27: 5000 [IU] via SUBCUTANEOUS
  Filled 2022-03-27: qty 1

## 2022-03-27 MED ORDER — OXYCODONE HCL 5 MG PO TABS
5.0000 mg | ORAL_TABLET | Freq: Once | ORAL | Status: AC | PRN
  Administered 2022-03-27: 5 mg via ORAL

## 2022-03-27 MED ORDER — ACETAMINOPHEN 10 MG/ML IV SOLN: 1000.0000 mg | Freq: Once | INTRAVENOUS | Status: DC | PRN

## 2022-03-27 MED ORDER — HEPARIN SODIUM (PORCINE) 5000 UNIT/ML IJ SOLN: 5000.0000 [IU] | Freq: Once | INTRAMUSCULAR | Status: AC

## 2022-03-27 MED ORDER — OXYCODONE HCL 5 MG/5ML PO SOLN: 5.0000 mg | Freq: Once | ORAL | Status: AC | PRN

## 2022-03-27 MED ORDER — PROPOFOL 10 MG/ML IV BOLUS
INTRAVENOUS | Status: AC
  Filled 2022-03-27: qty 20

## 2022-03-27 MED ORDER — METHYLENE BLUE 1 % INJ SOLN
INTRAVENOUS | Status: AC
  Filled 2022-03-27: qty 10

## 2022-03-27 MED ORDER — BUPIVACAINE-EPINEPHRINE (PF) 0.5% -1:200000 IJ SOLN
INTRAMUSCULAR | Status: DC | PRN
  Administered 2022-03-27: 30 mL

## 2022-03-27 MED ORDER — PHENYLEPHRINE 80 MCG/ML (10ML) SYRINGE FOR IV PUSH (FOR BLOOD PRESSURE SUPPORT)
PREFILLED_SYRINGE | INTRAVENOUS | Status: AC
  Filled 2022-03-27: qty 10

## 2022-03-27 MED ORDER — LIDOCAINE HCL (CARDIAC) PF 100 MG/5ML IV SOSY
PREFILLED_SYRINGE | INTRAVENOUS | Status: DC | PRN
  Administered 2022-03-27: 100 mg via INTRAVENOUS

## 2022-03-27 MED ORDER — BUPIVACAINE HCL (PF) 0.5 % IJ SOLN
INTRAMUSCULAR | Status: AC
  Filled 2022-03-27: qty 30

## 2022-03-27 MED ORDER — ORAL CARE MOUTH RINSE: 15.0000 mL | Freq: Once | OROMUCOSAL | Status: AC

## 2022-03-27 MED ORDER — CEFAZOLIN SODIUM-DEXTROSE 2-4 GM/100ML-% IV SOLN
2.0000 g | INTRAVENOUS | Status: AC
  Administered 2022-03-27: 2 g via INTRAVENOUS

## 2022-03-27 MED ORDER — ONDANSETRON HCL 4 MG/2ML IJ SOLN
INTRAMUSCULAR | Status: AC
  Filled 2022-03-27: qty 2

## 2022-03-27 MED ORDER — METHYLENE BLUE 1 % INJ SOLN
INTRAVENOUS | Status: DC | PRN
  Administered 2022-03-27: 10 mL via INTRADERMAL

## 2022-03-27 MED ORDER — LIDOCAINE HCL (PF) 2 % IJ SOLN
INTRAMUSCULAR | Status: AC
  Filled 2022-03-27: qty 5

## 2022-03-27 MED ORDER — ACETAMINOPHEN 10 MG/ML IV SOLN
INTRAVENOUS | Status: AC
  Filled 2022-03-27: qty 100

## 2022-03-27 MED ORDER — FENTANYL CITRATE (PF) 100 MCG/2ML IJ SOLN
25.0000 ug | INTRAMUSCULAR | Status: DC | PRN
  Administered 2022-03-27 (×2): 25 ug via INTRAVENOUS
  Administered 2022-03-27: 50 ug via INTRAVENOUS

## 2022-03-27 MED ORDER — OXYCODONE-ACETAMINOPHEN 5-325 MG PO TABS: 1.0000 | ORAL_TABLET | ORAL | 0 refills | Status: DC | PRN

## 2022-03-27 MED ORDER — OXYCODONE HCL 5 MG PO TABS
ORAL_TABLET | ORAL | Status: AC
  Filled 2022-03-27: qty 1

## 2022-03-27 MED ORDER — CEFAZOLIN SODIUM-DEXTROSE 2-4 GM/100ML-% IV SOLN
INTRAVENOUS | Status: AC
  Filled 2022-03-27: qty 100

## 2022-03-27 MED ORDER — KETAMINE HCL 10 MG/ML IJ SOLN
INTRAMUSCULAR | Status: DC | PRN
  Administered 2022-03-27: 20 mg via INTRAVENOUS
  Administered 2022-03-27: 10 mg via INTRAVENOUS

## 2022-03-27 MED ORDER — TECHNETIUM TC 99M TILMANOCEPT KIT
1.1000 | PACK | Freq: Once | INTRAVENOUS | Status: AC | PRN
  Administered 2022-03-27: 1.1 via INTRADERMAL

## 2022-03-27 MED ORDER — EPHEDRINE SULFATE (PRESSORS) 50 MG/ML IJ SOLN
INTRAMUSCULAR | Status: DC | PRN
  Administered 2022-03-27: 10 mg via INTRAVENOUS
  Administered 2022-03-27: 5 mg via INTRAVENOUS

## 2022-03-27 MED ORDER — FENTANYL CITRATE (PF) 100 MCG/2ML IJ SOLN
INTRAMUSCULAR | Status: AC
  Filled 2022-03-27: qty 2

## 2022-03-27 MED ORDER — DEXAMETHASONE SODIUM PHOSPHATE 10 MG/ML IJ SOLN
INTRAMUSCULAR | Status: DC | PRN
  Administered 2022-03-27: 10 mg via INTRAVENOUS

## 2022-03-27 MED ORDER — DEXMEDETOMIDINE HCL IN NACL 80 MCG/20ML IV SOLN
INTRAVENOUS | Status: DC | PRN
  Administered 2022-03-27 (×2): 4 ug via BUCCAL

## 2022-03-27 MED ORDER — ACETAMINOPHEN 10 MG/ML IV SOLN
INTRAVENOUS | Status: DC | PRN
  Administered 2022-03-27: 1000 mg via INTRAVENOUS

## 2022-03-27 MED ORDER — ONDANSETRON HCL 4 MG/2ML IJ SOLN
INTRAMUSCULAR | Status: DC | PRN
  Administered 2022-03-27: 4 mg via INTRAVENOUS

## 2022-03-27 MED ORDER — EPINEPHRINE PF 1 MG/ML IJ SOLN
INTRAMUSCULAR | Status: AC
  Filled 2022-03-27: qty 1

## 2022-03-27 MED ORDER — DEXAMETHASONE SODIUM PHOSPHATE 10 MG/ML IJ SOLN
INTRAMUSCULAR | Status: AC
  Filled 2022-03-27: qty 1

## 2022-03-27 MED ORDER — LACTATED RINGERS IV SOLN: INTRAVENOUS | Status: DC

## 2022-03-27 MED ORDER — ONDANSETRON HCL 4 MG/2ML IJ SOLN: 4.0000 mg | Freq: Once | INTRAMUSCULAR | Status: DC | PRN

## 2022-03-27 MED ORDER — SODIUM CHLORIDE 0.9 % IV SOLN: INTRAVENOUS | Status: DC

## 2022-03-27 MED ORDER — PHENYLEPHRINE HCL (PRESSORS) 10 MG/ML IV SOLN
INTRAVENOUS | Status: DC | PRN
  Administered 2022-03-27: 80 ug via INTRAVENOUS
  Administered 2022-03-27: 160 ug via INTRAVENOUS
  Administered 2022-03-27: 80 ug via INTRAVENOUS
  Administered 2022-03-27 (×2): 160 ug via INTRAVENOUS
  Administered 2022-03-27 (×2): 80 ug via INTRAVENOUS

## 2022-03-27 MED ORDER — GLYCOPYRROLATE 0.2 MG/ML IJ SOLN
INTRAMUSCULAR | Status: DC | PRN
  Administered 2022-03-27: .1 mg via INTRAVENOUS

## 2022-03-27 SURGICAL SUPPLY — 46 items
BLADE SURG 15 STRL LF DISP TIS (BLADE) ×2 IMPLANT
BLADE SURG 15 STRL SS (BLADE) ×2
CHLORAPREP W/TINT 26 (MISCELLANEOUS) ×1 IMPLANT
CNTNR URN SCR LID CUP LEK RST (MISCELLANEOUS) IMPLANT
CONT SPEC 4OZ STRL OR WHT (MISCELLANEOUS)
COVER LIGHT HANDLE STERIS (MISCELLANEOUS) IMPLANT
COVER PROBE GAMMA FINDER SLV (MISCELLANEOUS) ×1 IMPLANT
DERMABOND ADVANCED .7 DNX12 (GAUZE/BANDAGES/DRESSINGS) ×1 IMPLANT
DEVICE DUBIN SPECIMEN MAMMOGRA (MISCELLANEOUS) ×1 IMPLANT
DRAPE LAPAROTOMY TRNSV 106X77 (MISCELLANEOUS) ×1 IMPLANT
DRSG GAUZE FLUFF 36X18 (GAUZE/BANDAGES/DRESSINGS) IMPLANT
ELECT CAUTERY BLADE 6.4 (BLADE) ×1 IMPLANT
ELECT REM PT RETURN 9FT ADLT (ELECTROSURGICAL) ×1
ELECTRODE REM PT RTRN 9FT ADLT (ELECTROSURGICAL) ×1 IMPLANT
GAUZE 4X4 16PLY ~~LOC~~+RFID DBL (SPONGE) ×1 IMPLANT
GLOVE BIO SURGEON STRL SZ 6.5 (GLOVE) ×1 IMPLANT
GLOVE BIOGEL PI IND STRL 6.5 (GLOVE) ×1 IMPLANT
GOWN STRL REUS W/ TWL LRG LVL3 (GOWN DISPOSABLE) ×2 IMPLANT
GOWN STRL REUS W/TWL LRG LVL3 (GOWN DISPOSABLE) ×2
HEMOSTAT ARISTA ABSORB 3G PWDR (HEMOSTASIS) IMPLANT
HOLSTER ELECTROSUGICAL PENCIL (MISCELLANEOUS) IMPLANT
KIT MARKER MARGIN INK (KITS) IMPLANT
KIT TURNOVER KIT A (KITS) ×1 IMPLANT
LABEL OR SOLS (LABEL) ×1 IMPLANT
MANIFOLD NEPTUNE II (INSTRUMENTS) ×1 IMPLANT
MARKER MARGIN CORRECT CLIP (MARKER) IMPLANT
NDL HYPO 25X1 1.5 SAFETY (NEEDLE) ×1 IMPLANT
NEEDLE HYPO 22GX1.5 SAFETY (NEEDLE) ×1 IMPLANT
NEEDLE HYPO 25X1 1.5 SAFETY (NEEDLE) ×1 IMPLANT
PACK BASIN MINOR ARMC (MISCELLANEOUS) ×1 IMPLANT
RETRACTOR RING XSMALL (MISCELLANEOUS) IMPLANT
RTRCTR WOUND ALEXIS 13CM XS SH (MISCELLANEOUS) ×1
SET LOCALIZER 20 PROBE US (MISCELLANEOUS) ×1 IMPLANT
SUT MNCRL 4-0 (SUTURE) ×2
SUT MNCRL 4-0 27XMFL (SUTURE) ×2
SUT SILK 2 0 SH (SUTURE) ×1 IMPLANT
SUT SILK 3 0 SH 30 (SUTURE) IMPLANT
SUT VIC AB 3-0 SH 27 (SUTURE) ×2
SUT VIC AB 3-0 SH 27X BRD (SUTURE) ×2 IMPLANT
SUTURE MNCRL 4-0 27XMF (SUTURE) ×2 IMPLANT
SYR 10ML LL (SYRINGE) ×2 IMPLANT
SYR BULB IRRIG 60ML STRL (SYRINGE) ×1 IMPLANT
TRAP FLUID SMOKE EVACUATOR (MISCELLANEOUS) ×1 IMPLANT
TRAP NEPTUNE SPECIMEN COLLECT (MISCELLANEOUS) ×1 IMPLANT
WATER STERILE IRR 1000ML POUR (IV SOLUTION) ×1 IMPLANT
WATER STERILE IRR 500ML POUR (IV SOLUTION) ×1 IMPLANT

## 2022-03-27 NOTE — Transfer of Care (Signed)
Immediate Anesthesia Transfer of Care Note  Patient: Vanessa Fisher  Procedure(s) Performed: PART MASTECTOMY,RADIO FREQUENCY LOCALIZER,AXILLARY SENTINEL NODE BIOPSY (Left: Breast)  Patient Location: PACU  Anesthesia Type:General  Level of Consciousness: drowsy  Airway & Oxygen Therapy: Patient Spontanous Breathing and Patient connected to face mask oxygen  Post-op Assessment: Report given to RN and Post -op Vital signs reviewed and stable  Post vital signs: Reviewed and stable  Last Vitals:  Vitals Value Taken Time  BP 114/66 03/27/22 1345  Temp 35.8 1344  Pulse 92 03/27/22 1351  Resp 16 03/27/22 1351  SpO2 100 % 03/27/22 1351  Vitals shown include unvalidated device data.  Last Pain:  Vitals:   03/27/22 0955  TempSrc: Oral  PainSc: 0-No pain         Complications: No notable events documented.

## 2022-03-27 NOTE — Interval H&P Note (Signed)
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  The various methods of treatment have been discussed with the patient and family. After consideration of risks, benefits and other options for treatment, the patient has consented to  Procedure(s): PART [place] (Left) as a surgical intervention.  The patient's history has been reviewed, patient examined, no change in status, stable for surgery.  I have reviewed the patient's chart and labs.  Questions were answered to the patient's satisfaction.     Herbert Pun

## 2022-03-27 NOTE — Discharge Instructions (Addendum)
?  Diet: Resume home heart healthy regular diet.  ? ?Activity: No heavy lifting >20 pounds (children, pets, laundry, garbage) or strenuous activity until follow-up, but light activity and walking are encouraged. Do not drive or drink alcohol if taking narcotic pain medications. ? ?Wound care: May shower with soapy water and pat dry (do not rub incisions), but no baths or submerging incision underwater until follow-up. (no swimming)  ? ?Medications: Resume all home medications. For mild to moderate pain: acetaminophen (Tylenol) or ibuprofen (if no kidney disease). Combining Tylenol with alcohol can substantially increase your risk of causing liver disease. Narcotic pain medications, if prescribed, can be used for severe pain, though may cause nausea, constipation, and drowsiness. If you do not need the narcotic pain medication, you do not need to fill the prescription. ? ?Call office ([phone]) at any time if any questions, worsening pain, fevers/chills, bleeding, drainage from incision site, or other concerns. ? ? ?AMBULATORY SURGERY  ?DISCHARGE INSTRUCTIONS ? ? ?The drugs that you were given will stay in your system until tomorrow so for the next 24 hours you should not: ? ?Drive an automobile ?Make any legal decisions ?Drink any alcoholic beverage ? ? ?You may resume regular meals tomorrow.  Today it is better to start with liquids and gradually work up to solid foods. ? ?You may eat anything you prefer, but it is better to start with liquids, then soup and crackers, and gradually work up to solid foods. ? ? ?Please notify your doctor immediately if you have any unusual bleeding, trouble breathing, redness and pain at the surgery site, drainage, fever, or pain not relieved by medication. ? ? ? ?Additional Instructions: ? ? ? ? ? ? ? ?Please contact your physician with any problems or Same Day Surgery at [phone], Monday through Friday 6 am to 4 pm, or [facility] number at [phone].  ?

## 2022-03-27 NOTE — Op Note (Signed)
Preoperative diagnosis: Left breast carcinoma.  Postoperative diagnosis: Left breast carcinoma.   Procedure: Left radiofrequency tag-localized partial mastectomy.                      Left Axillary Node dissection                       Anesthesia: GETA  Surgeon: Dr. Windell Moment  Wound Classification: Clean  Indications: Patient is a 62 y.o. female with a palpable left breast mass noted on mammography with core biopsy demonstrating invasive mammary carcinoma with positive axillary metastatic lymph node requires radiofrequency tag-localized partial mastectomy for treatment with sentinel lymph node biopsy vs axillar node dissection  Findings: 1. Specimen mammography shows marker and tag on specimen 2. Pathology call refers gross examination of margins was close to anterior and posterior margin.  Reexcision of anterior and posterior margin was done. 3.  Previously biopsied lymph node was excised.  No other sentinel node was able to be identified.  Decision was to perform axillary node dissection  Description of procedure: Preoperative radiofrequency tag localization was performed by radiology. In the nuclear medicine suite, the subareolar region was injected with Tc-99 sulfur colloid. Localization studies were reviewed. The patient was taken to the operating room and placed supine on the operating table, and after general anesthesia the left chest and axilla were prepped and draped in the usual sterile fashion. A time-out was completed verifying correct patient, procedure, site, positioning, and implant(s) and/or special equipment prior to beginning this procedure.  I personally injected methylene blue around the areola for intraoperative evaluation of sentinel lymph node.  By comparing the localization studies and interrogation with Localizer device, the probable trajectory and location of the mass was visualized. A circumareolar skin incision was planned in such a way as to minimize the amount  of dissection to reach the mass.  The skin incision was made. Flaps were raised and the location of the tag was confirmed with Localizer device confirmed. A 2-0 silk figure-of-eight stay suture was placed and used for retraction. Dissection was then taken down circumferentially, taking care to include the entire localizing tag and a wide margin of grossly normal tissue. The specimen and entire localizing tag were removed. The specimen was oriented and sent to radiology with the localization studies.  Pathology reported that the fibrosis from previous mass was extending to the anterior and posterior margin.  The anterior and posterior margin was reexcised.  The wound was irrigated. Hemostasis was checked. The wound was closed with interrupted sutures of 3-0 Vicryl and a subcuticular suture of Monocryl 3-0. No attempt was made to close the dead space.   With the localizer, RF tag was identified.  An incision was made around the caudal axillary hairline. Dissection was carried down until subdermal facias was advanced.  Dissection was carried down to the previously biopsied lymph node identified with localizer.  The was excised and sent to frozen section.  The node seeker was used to try to identify the hot lymph nodes.  No good signal was able to be found.  There was no blue lymph node identified either.  Unable to identify adequate sentinel lymph node I decided to proceed with axillary dissection. The clavipectoral fascia was then incised along the edge of the pectoralis major and the pectoralis major and minor were freed from surrounding fat and nodal tissue. Dissection progressed first under the pectoralis major and then under the pectoralis minor muscle. The  pectoral muscles were retracted medially with a Richardson retractor. The medial pectoral neurovascular bundle was identified and preserved. The level II nodal tissue deep in the pectoralis minor was included in the dissection. The axillary vein was then  identified and cleared of overlying fat. The first branch off the axillary vein was clamped, divided, and tied with 2-0 silk ties. The thoracodorsal nerve was then identified deep to the ligated vein and preserved. The long thoracic nerve was then identified along the edge of the latissimus dorsi on the chest wall and preserved. The remaining nodal tissue between these nerves was then carefully removed, taking care to protect the nerves. The specimen, consisting of breast and attached nodal tissue, was then excised by dividing the remaining lateral pedical and sent to pathology.  The procedure was terminated. Hemostasis was achieved and the wound closed in layers with deep interrupted 3-0 Vicryl and skin was closed with subcuticular suture of Monocryl 3-0.  The patient tolerated the procedure well and was taken to the postanesthesia care unit in stable condition.   Specimen: Left Breast mass                     Previously biopsied left axillary lymph node                    Left axillar node dissection  Complications: None  Estimated Blood Loss: 10 mL

## 2022-03-27 NOTE — Anesthesia Procedure Notes (Signed)
Procedure Name: LMA Insertion Date/Time: 03/27/2022 11:15 AM  Performed by: Cammie Sickle, CRNAPre-anesthesia Checklist: Patient identified, Patient being monitored, Timeout performed, Emergency Drugs available and Suction available Patient Re-evaluated:Patient Re-evaluated prior to induction Oxygen Delivery Method: Circle system utilized Preoxygenation: Pre-oxygenation with 100% oxygen Induction Type: IV induction Ventilation: Mask ventilation without difficulty LMA: LMA inserted LMA Size: 4.0 Tube type: Oral Number of attempts: 1 Placement Confirmation: positive ETCO2 and breath sounds checked- equal and bilateral Tube secured with: Tape Dental Injury: Teeth and Oropharynx as per pre-operative assessment  Comments: Smooth atraumatic LMA placement, no complications noted.

## 2022-03-27 NOTE — Anesthesia Postprocedure Evaluation (Signed)
Anesthesia Post Note  Patient: Vanessa Fisher  Procedure(s) Performed: PART MASTECTOMY,RADIO FREQUENCY LOCALIZER,AXILLARY SENTINEL NODE BIOPSY (Left: Breast)  Patient location during evaluation: PACU Anesthesia Type: General Level of consciousness: awake and alert, oriented and patient cooperative Pain management: pain level controlled Vital Signs Assessment: post-procedure vital signs reviewed and stable Respiratory status: spontaneous breathing, nonlabored ventilation and respiratory function stable Cardiovascular status: blood pressure returned to baseline and stable Postop Assessment: adequate PO intake Anesthetic complications: no   No notable events documented.   Last Vitals:  Vitals:   03/27/22 1415 03/27/22 1422  BP: 124/68   Pulse: 89 88  Resp: 13 16  Temp: 36.9 C 37 C  SpO2: 100% 100%    Last Pain:  Vitals:   03/27/22 1422  TempSrc:   PainSc: [place]

## 2022-03-27 NOTE — Anesthesia Preprocedure Evaluation (Addendum)
Anesthesia Evaluation  Patient identified by MRN, date of birth, ID band Patient awake    Reviewed: Allergy & Precautions, NPO status , Patient's Chart, lab work & pertinent test results  History of Anesthesia Complications Negative for: history of anesthetic complications  Airway Mallampati: IV   Neck ROM: Full    Dental  (+) Upper Dentures, Lower Dentures   Pulmonary asthma    Pulmonary exam normal breath sounds clear to auscultation       Cardiovascular Exercise Tolerance: Good negative cardio ROS Normal cardiovascular exam Rhythm:Regular Rate:Normal  ECG 03/20/22: normal   Neuro/Psych  Headaches PSYCHIATRIC DISORDERS Anxiety Depression       GI/Hepatic ,GERD  ,,  Endo/Other  diabetes, Type 2  Obesity   Renal/GU Renal disease (nephrolithiasis)     Musculoskeletal  (+) Arthritis ,    Abdominal   Peds  Hematology negative hematology ROS (+)   Anesthesia Other Findings   Reproductive/Obstetrics                             Anesthesia Physical Anesthesia Plan  ASA: 2  Anesthesia Plan: General   Post-op Pain Management:    Induction: Intravenous  PONV Risk Score and Plan: 3 and Ondansetron, Dexamethasone and Treatment may vary due to age or medical condition  Airway Management Planned: LMA  Additional Equipment:   Intra-op Plan:   Post-operative Plan: Extubation in OR  Informed Consent: I have reviewed the patients History and Physical, chart, labs and discussed the procedure including the risks, benefits and alternatives for the proposed anesthesia with the patient or authorized representative who has indicated his/her understanding and acceptance.     Dental advisory given  Plan Discussed with: CRNA  Anesthesia Plan Comments: (Patient consented for risks of anesthesia including but not limited to:  - adverse reactions to medications - damage to eyes, teeth, lips or  other oral mucosa - nerve damage due to positioning  - sore throat or hoarseness - damage to heart, brain, nerves, lungs, other parts of body or loss of life  Informed patient about role of CRNA in peri- and intra-operative care.  Patient voiced understanding.)        Anesthesia Quick Evaluation

## 2022-03-28 ENCOUNTER — Encounter: Payer: Self-pay | Admitting: General Surgery

## 2022-03-31 ENCOUNTER — Other Ambulatory Visit: Payer: Self-pay | Admitting: Anatomic Pathology & Clinical Pathology

## 2022-03-31 LAB — SURGICAL PATHOLOGY

## 2022-04-04 ENCOUNTER — Other Ambulatory Visit: Payer: Self-pay | Admitting: Surgery

## 2022-04-04 DIAGNOSIS — C50011 Malignant neoplasm of nipple and areola, right female breast: Secondary | ICD-10-CM

## 2022-04-04 MED ORDER — OXYCODONE-ACETAMINOPHEN 5-325 MG PO TABS: 1.0000 | ORAL_TABLET | ORAL | 0 refills | Status: AC | PRN

## 2022-04-04 NOTE — Progress Notes (Signed)
Called by patient regarding need for more pain.  Has some drainage earlier in the week from axillary incision that has stopped. No reported erythema, fever, increasing pain or discharge.  She is still taking alleve and tylenol. Will refill percocet and advised her to keep f/u appointment next week.

## 2022-04-10 ENCOUNTER — Inpatient Hospital Stay: Payer: Medicare HMO

## 2022-04-10 ENCOUNTER — Inpatient Hospital Stay (HOSPITAL_BASED_OUTPATIENT_CLINIC_OR_DEPARTMENT_OTHER): Payer: Medicare HMO | Admitting: Oncology

## 2022-04-10 ENCOUNTER — Inpatient Hospital Stay: Payer: Medicare HMO | Attending: Oncology

## 2022-04-10 ENCOUNTER — Encounter: Payer: Self-pay | Admitting: Oncology

## 2022-04-10 VITALS — BP 120/66 | HR 68 | Temp 96.9°F | Resp 18 | Wt 210.8 lb

## 2022-04-10 DIAGNOSIS — C50812 Malignant neoplasm of overlapping sites of left female breast: Secondary | ICD-10-CM

## 2022-04-10 DIAGNOSIS — E876 Hypokalemia: Secondary | ICD-10-CM | POA: Insufficient documentation

## 2022-04-10 DIAGNOSIS — Z5112 Encounter for antineoplastic immunotherapy: Secondary | ICD-10-CM | POA: Insufficient documentation

## 2022-04-10 DIAGNOSIS — Z171 Estrogen receptor negative status [ER-]: Secondary | ICD-10-CM

## 2022-04-10 DIAGNOSIS — C50011 Malignant neoplasm of nipple and areola, right female breast: Secondary | ICD-10-CM

## 2022-04-10 DIAGNOSIS — F419 Anxiety disorder, unspecified: Secondary | ICD-10-CM | POA: Diagnosis not present

## 2022-04-10 DIAGNOSIS — T451X5A Adverse effect of antineoplastic and immunosuppressive drugs, initial encounter: Secondary | ICD-10-CM

## 2022-04-10 DIAGNOSIS — Z5181 Encounter for therapeutic drug level monitoring: Secondary | ICD-10-CM | POA: Diagnosis not present

## 2022-04-10 DIAGNOSIS — Z79899 Other long term (current) drug therapy: Secondary | ICD-10-CM

## 2022-04-10 DIAGNOSIS — D6481 Anemia due to antineoplastic chemotherapy: Secondary | ICD-10-CM | POA: Insufficient documentation

## 2022-04-10 LAB — COMPREHENSIVE METABOLIC PANEL
ALT: 15 U/L (ref 0–44)
AST: 18 U/L (ref 15–41)
Albumin: 3.5 g/dL (ref 3.5–5.0)
Alkaline Phosphatase: 95 U/L (ref 38–126)
Anion gap: 6 (ref 5–15)
BUN: 19 mg/dL (ref 6–20)
CO2: 25 mmol/L (ref 22–32)
Calcium: 8.7 mg/dL — ABNORMAL LOW (ref 8.9–10.3)
Chloride: 106 mmol/L (ref 98–111)
Creatinine, Ser: 0.86 mg/dL (ref 0.44–1.00)
GFR, Estimated: >60 mL/min (ref >60)
Glucose, Bld: 105 mg/dL — ABNORMAL HIGH (ref 70–99)
Potassium: 4 mmol/L (ref 3.5–5.1)
Sodium: 137 mmol/L (ref 135–145)
Total Bilirubin: 0.3 mg/dL (ref 0.3–1.2)
Total Protein: 6.8 g/dL (ref 6.5–8.1)

## 2022-04-10 LAB — CBC WITH DIFFERENTIAL/PLATELET
Abs Immature Granulocytes: 0.01 10*3/uL (ref 0.00–0.07)
Basophils Absolute: 0 10*3/uL (ref 0.0–0.1)
Basophils Relative: 0 %
Eosinophils Absolute: 0.1 10*3/uL (ref 0.0–0.5)
Eosinophils Relative: 2 %
HCT: 27.8 % — ABNORMAL LOW (ref 36.0–46.0)
Hemoglobin: 8.7 g/dL — ABNORMAL LOW (ref 12.0–15.0)
Immature Granulocytes: 0 %
Lymphocytes Relative: 41 %
Lymphs Abs: 1.9 10*3/uL (ref 0.7–4.0)
MCH: 28.2 pg (ref 26.0–34.0)
MCHC: 31.3 g/dL (ref 30.0–36.0)
MCV: 90.3 fL (ref 80.0–100.0)
Monocytes Absolute: 0.5 10*3/uL (ref 0.1–1.0)
Monocytes Relative: 10 %
Neutro Abs: 2.2 10*3/uL (ref 1.7–7.7)
Neutrophils Relative %: 47 %
Platelets: 272 10*3/uL (ref 150–400)
RBC: 3.08 MIL/uL — ABNORMAL LOW (ref 3.87–5.11)
RDW: 16.3 % — ABNORMAL HIGH (ref 11.5–15.5)
WBC: 4.7 10*3/uL (ref 4.0–10.5)
nRBC: 0 % (ref 0.0–0.2)

## 2022-04-10 LAB — MAGNESIUM: Magnesium: 1.7 mg/dL (ref 1.7–2.4)

## 2022-04-10 MED ORDER — SODIUM CHLORIDE 0.9% FLUSH
10.0000 mL | Freq: Once | INTRAVENOUS | Status: AC
  Administered 2022-04-10: 10 mL via INTRAVENOUS
  Filled 2022-04-10: qty 10

## 2022-04-10 MED ORDER — HEPARIN SOD (PORK) LOCK FLUSH 100 UNIT/ML IV SOLN
500.0000 [IU] | Freq: Once | INTRAVENOUS | Status: AC
  Administered 2022-04-10: 500 [IU] via INTRAVENOUS
  Filled 2022-04-10: qty 5

## 2022-04-10 NOTE — Progress Notes (Signed)
Hematology/Oncology Progress note [phone] [phone]     CHIEF COMPLAINTS/REASON FOR VISIT:  Follow-up for Stage IIIB left breast cancer, ER-, HER2+  ASSESSMENT & PLAN:   Cancer Staging  Breast cancer [facility]) Staging form: Breast, AJCC 8th Edition - Clinical stage from 08/21/2021: Stage IIIB (cT4b, cN3c, cM0, G3, ER-, PR-, HER2+) - Signed by Earlie Server, MD on 09/12/2021  Breast cancer ([place]) Left breast cancer, cT4b N3 Mx, ER/PR-, HER2 + Overall she tolerates chemotherapy except diarrhea. Clinically left breast mass has responded to treatment. Labs are reviewed and discussed with patient. Echo showed stable LVEF S/p 6 cycles of TCHP  S/p left mastectomy + SLNB -ypT0 ypN0, complete pathological response.  Recommend adjuvant Transtuzumab and Pertuzumab to complete 1 year treatment if she tolerates.  She will also need to establish care with Radonc, will refer at next visit.  -awaiting her recovering from surgery  Anemia due to antineoplastic chemotherapy Hemoglobin is stable. Monitor counts.   Hypomagnesemia IV magnesium PRN if Mag <1.7 Magnesium level has normalized.    Orders Placed This Encounter  Procedures   MR Brain W Wo Contrast    Standing Status:   Future    Standing Expiration Date:   04/10/2023    Order Specific Question:   If indicated for the ordered procedure, I authorize the administration of contrast media per Radiology protocol    Answer:   Yes    Order Specific Question:   What is the patient's sedation requirement?    Answer:   No Sedation    Order Specific Question:   Does the patient have a pacemaker or implanted devices?    Answer:   No    Order Specific Question:   Use SRS Protocol?    Answer:   Yes    Order Specific Question:   Preferred imaging location?    Answer:   [facility] (table limit - 550lbs)   ECHOCARDIOGRAM COMPLETE    Standing Status:   Future    Standing Expiration Date:   04/10/2023    Order Specific  Question:   Where should this test be performed    Answer:   [facility]    Order Specific Question:   Perflutren DEFINITY (image enhancing agent) should be administered unless hypersensitivity or allergy exist    Answer:   Administer Perflutren    Order Specific Question:   Reason for exam-Echo    Answer:   Chemo  Z09     All questions were answered. The patient knows to call the clinic with any problems questions or concerns.  Return of visit:  2 weeks lab MD Transtuzumab + Pertuzumab  Earlie Server, MD, PhD [facility] Hematology Oncology 04/10/2022      HISTORY OF PRESENTING ILLNESS:  Vanessa Fisher is a  62 y.o.  female with PMH listed below who was referred to me for evaluation of  left breast cancer SUMMARY OF ONCOLOGIC HISTORY: Oncology History  Breast cancer ([place])  07/31/2021 Imaging   Bilateral diagnostic mammogram and US showed 5 centimeter LEFT breast mass associated with pleomorphic calcifications is suspicious for invasive ductal carcinoma.At least 4 LEFT axillary lymph nodes with abnormal morphology   08/21/2021 Cancer Staging   Staging form: Breast, AJCC 8th Edition - Clinical: Stage IIIB (cT4b, cN3c, cM0, G3, ER-, PR-, HER2+) - Signed by Earlie Server, MD on 08/28/2021 Histologic grading system: 3 grade system  -08/21/21 left breast ultrasound-guided biopsy showed invasive mammary carcinoma, grade 3, ER/PR negative, HER2 positive.  Left axillary lymph node biopsy positive for macro metastatic mammary carcinoma, 8 mm in greatest extent.   08/30/2021 Imaging   MRI brain w wo contrast -No metastatic disease or acute intracranial abnormality. Essentially normal for age MRI appearance of the brain.    09/03/2021 Echocardiogram   1. Left ventricular ejection fraction, by estimation, is 55 to 60%. Left ventricular ejection fraction by 3D volume is 58 %. The left ventricle has normal function. The left ventricle has no regional wall motion abnormalities. There is mild  left  ventricular hypertrophy. Left ventricular diastolic parameters were normal.  2. Right ventricular systolic function is normal. The right ventricular size is normal.  3. The mitral valve is normal in structure. No evidence of mitral valve regurgitation.  4. The aortic valve was not well visualized. Aortic valve regurgitation is not visualized   09/10/2021 Imaging   PET scan showed Large hypermetabolic left breast mass and diffuse skin thickening,consistent with primary breast carcinoma. Hypermetabolic lymphadenopathy in the left axilla, left subpectoral region, and left supraclavicular region, consistent with metastatic disease. No evidence of metastatic disease within the abdomen or pelvis   09/12/2021 - 02/13/2022 Chemotherapy   Patient is on Treatment Plan : BREAST  Docetaxel + Carboplatin + Trastuzumab + Pertuzumab  (TCHP) q21d       Genetic Testing   Negative genetic testing. No pathogenic variants identified on the [facility] Common Hereditary Cancers+RNA panel. The report date is 10/26/2021.  The Common Hereditary Cancers Panel + RNA offered by [facility] includes sequencing and/or deletion duplication testing of the following 47 genes: APC, ATM, AXIN2, BARD1, BMPR1A, BRCA1, BRCA2, BRIP1, CDH1, CDKN2A (p14ARF), CDKN2A (p16INK4a), CKD4, CHEK2, CTNNA1, DICER1, EPCAM (Deletion/duplication testing only), GREM1 (promoter region deletion/duplication testing only), KIT, MEN1, MLH1, MSH2, MSH3, MSH6, MUTYH, NBN, NF1, NHTL1, PALB2, PDGFRA, PMS2, POLD1, POLE, PTEN, RAD50, RAD51C, RAD51D, SDHB, SDHC, SDHD, SMAD4, SMARCA4. STK11, TP53, TSC1, TSC2, and VHL.  The following genes were evaluated for sequence changes only: SDHA and HOXB13 c.251G>A variant only.   02/09/2022 Echocardiogram   LVEF 55 to 60%    03/27/2022 Surgery   S/p left mastectomy + SLNB ypT0 ypN0,    04/23/2022 -  Chemotherapy   Patient is on Treatment Plan : BREAST Trastuzumab  + Pertuzumab q21d x 13 cycles       INTERVAL  HISTORY Vanessa Fisher is a 62 y.o. female who has above history reviewed by me today presents for follow up visit for Triple positive breast cancer treatment.  She underwent left mastectomy and SLNB. She reports some soreness of surgical sites. Otherwise no new complaints.  + headache.     MEDICAL HISTORY:  Past Medical History:  Diagnosis Date   Anemia    Anxiety    Arthritis    Asthma    WELL CONTROLLED   Bile acid malabsorption syndrome    Bradycardia    HAD AN ISSUE WITH THIS IN 2016-NO PROBLEMS SINCE   Breast cancer, left breast ([place]) 08/2021   Carpal tunnel syndrome on right    Depression    Diabetes mellitus without complication (HCC)    Diverticulitis    Gastric reflux    GERD (gastroesophageal reflux disease)    Hand pain 05/12/2016   Headache    H/O MIGRAINES   History of kidney stones    H/O   Lumbar radiculitis    Neck pain, chronic    Osteoarthritis of both knees    Panic attacks     SURGICAL HISTORY: Past Surgical  History:  Procedure Laterality Date   BREAST BIOPSY Left 08/21/2021   Axilla Bx, Hydromarker, neg   BREAST BIOPSY Left 08/21/2021   [place] Bx, Ribbon Clip, invasive mammary carcinoma   BREAST BIOPSY Left 03/12/2022   [place] LT RADIO FREQUENCY TAG LOC US GUIDE 03/12/2022 [facility]   BREAST BIOPSY Left 03/12/2022   [place] LT RADIO FREQUENCY TAG EA ADD LESION LOC US GUIDE 03/12/2022 [facility]   CARPAL TUNNEL RELEASE Right 12/06/2017   Procedure: CARPAL TUNNEL RELEASE;  Surgeon: Earnestine Leys, MD;  Location: [facility] OR[facility];  Service: Orthopedics;  Laterality: Right;   COLONOSCOPY WITH PROPOFOL N/A 06/11/2021   Procedure: COLONOSCOPY WITH PROPOFOL;  Surgeon: Lin Landsman, MD;  Location[facility];  Service: Gastroenterology;  Laterality: N/A;   DORSAL COMPARTMENT RELEASE Right 12/06/2017   Procedure: RELEASE DORSAL COMPARTMENT (DEQUERVAIN);  Surgeon: Earnestine Leys, MD;  Location: [facility] ORS;  Service: Orthopedics;  Laterality: Right;    ESOPHAGOGASTRODUODENOSCOPY (EGD) WITH PROPOFOL N/A 06/11/2021   Procedure: ESOPHAGOGASTRODUODENOSCOPY (EGD) WITH PROPOFOL;  Surgeon: Lin Landsman, MD;  Location: [facility] ENDOSCOPY;  Service: Gastroenterology;  Laterality: N/A;   PART MASTECTOMY,RADIO FREQUENCY LOCALIZER,AXILLARY SENTINEL NODE BIOPSY Left 03/27/2022   Procedure: PART MASTECTOMY,RADIO FREQUENCY LOCALIZER,AXILLARY SENTINEL NODE BIOPSY;  Surgeon: Herbert Pun, MD;  Location: [facility] ORS;  Service: General;  Laterality: Left;   PARTIAL HYSTERECTOMY     PORTACATH PLACEMENT N/A 09/24/2021   Procedure: INSERTION PORT-A-CATH;  Surgeon: Herbert Pun, MD;  Location: [facility] ORS;  Service: General;  Laterality: N/A;   TUBAL LIGATION      SOCIAL HISTORY: Social History   Socioeconomic History   Marital status: Single    Spouse name: Not on file   Number of children: Not on file   Years of education: Not on file   Highest education level: Not on file  Occupational History   Not on file  Tobacco Use   Smoking status: Former    Types: Cigarettes   Smokeless tobacco: Never  Vaping Use   Vaping Use: Never used  Substance and Sexual Activity   Alcohol use: No   Drug use: No   Sexual activity: Not on file  Other Topics Concern   Not on file  Social History Narrative   Lives alone   Social Determinants of Health   Financial Resource Strain: High Risk (08/15/2021)   Overall Financial Resource Strain (CARDIA)    Difficulty of Paying Living Expenses: Very hard  Food Insecurity: Food Insecurity Present (09/09/2021)   Hunger Vital Sign    Worried About Running Out of Food in the Last Year: Often true    Ran Out of Food in the Last Year: Often true  Transportation Needs: No Transportation Needs (08/15/2021)   PRAPARE - Hydrologist (Medical): No    Lack of Transportation (Non-Medical): No  Physical Activity: Inactive (08/15/2021)   Exercise Vital Sign    Days of Exercise per Week: 0 days     Minutes of Exercise per Session: 0 min  Stress: Stress Concern Present (08/15/2021)   [place]    Feeling of Stress : Rather much  Social Connections: Socially Isolated (08/15/2021)   Social Connection and Isolation Panel [NHANES]    Frequency of Communication with Friends and Family: Once a week    Frequency of Social Gatherings with Friends and Family: Once a week    Attends Religious Services: Never    Marine scientist or Organizations: No  Attends Archivist Meetings: Not on file    Marital Status: Never married  Intimate Partner Violence: Not At Risk (08/15/2021)   Humiliation, Afraid, Rape, and Kick questionnaire    Fear of Current or Ex-Partner: No    Emotionally Abused: No    Physically Abused: No    Sexually Abused: No    FAMILY HISTORY: Family History  Problem Relation Age of Onset   Diabetes Mother    Hypertension Mother    Heart failure Mother    Pancreatic cancer Mother 28   Diabetes Father    Hypertension Father    Breast cancer Maternal Aunt    Cervical cancer Maternal Aunt    Lung cancer Son 105    ALLERGIES:  is allergic to bc powder [aspirin-salicylamide-caffeine], doxycycline, gabapentin, hydrocodone-acetaminophen, latex, penicillin g, penicillins, and shellfish allergy.  MEDICATIONS:  Current Outpatient Medications  Medication Sig Dispense Refill   albuterol (VENTOLIN HFA) 108 (90 Base) MCG/ACT inhaler Inhale 2 puffs into the lungs every 6 (six) hours as needed for wheezing or shortness of breath. 18 g 0   ALPRAZolam (XANAX) 0.25 MG tablet Take 1 tablet (0.25 mg total) by mouth daily as needed for anxiety. 30 tablet 0   Azelastine HCl 137 MCG/SPRAY SOLN Place 1 spray into the nose daily. 30 mL 0   cetirizine (ZYRTEC ALLERGY) 10 MG tablet Take 1 tablet (10 mg total) by mouth daily. 90 tablet 0   esomeprazole (NEXIUM) 40 MG capsule Take 1 capsule (40 mg total) by mouth  daily at 12 noon. 30 capsule 3   FLOVENT DISKUS 250 MCG/ACT AEPB Inhale 1 puff into the lungs daily.     fluticasone (FLONASE) 50 MCG/ACT nasal spray Place 2 sprays into both nostrils daily. 15.8 mL 0   lidocaine-prilocaine (EMLA) cream Apply 1 Application topically as needed. 30 g 12   magic mouthwash (multi-ingredient) oral suspension Swish and spit 5-10 ml by mouth 4 times a day as needed 400 mL 1   magnesium chloride (SLOW-MAG) 64 MG TBEC SR tablet Take 1 tablet (64 mg total) by mouth daily. 30 tablet 3   montelukast (SINGULAIR) 10 MG tablet Take by mouth.     ondansetron (ZOFRAN) 8 MG tablet Take 1 tablet (8 mg total) by mouth 2 (two) times daily as needed (Nausea or vomiting). Start on the third day after chemotherapy. 30 tablet 1   oxyCODONE-acetaminophen (PERCOCET) 5-325 MG tablet Take 1 tablet by mouth every 4 (four) hours as needed for up to 10 days for severe pain. 20 tablet 0   oxyCODONE-acetaminophen (PERCOCET/ROXICET) 5-325 MG tablet Take 1 tablet by mouth every 6 (six) hours as needed for pain.     potassium chloride SA (KLOR-CON M20) 20 MEQ tablet TAKE 1 TABLET BY MOUTH TWICE A DAY 60 tablet 1   prochlorperazine (COMPAZINE) 10 MG tablet Take 1 tablet (10 mg total) by mouth every 6 (six) hours as needed (Nausea or vomiting). 30 tablet 1   traZODone (DESYREL) 50 MG tablet Take 1 tablet (50 mg total) by mouth at bedtime as needed for sleep. 90 tablet 1   zinc gluconate 50 MG tablet Take 50 mg by mouth as needed.     albuterol (PROVENTIL) (2.5 MG/3ML) 0.083% nebulizer solution Take 3 mLs (2.5 mg total) by nebulization every 4 (four) hours as needed for wheezing or shortness of breath. (Patient not taking: Reported on 01/27/2022) 75 mL 2   naloxone (NARCAN) nasal spray 4 mg/0.1 mL  (Patient not taking: Reported on  12/17/2021)     No current facility-administered medications for this visit.   Facility-Administered Medications Ordered in Other Visits  Medication Dose Route Frequency  Provider Last Rate Last Admin   magnesium sulfate IVPB 2 g 50 mL  2 g Intravenous Once C[place]ton, Sarah M, PA-C        Review of Systems  Constitutional:  Negative for appetite change, chills, fatigue and fever.  HENT:   Negative for hearing loss and voice change.        Nasal congestion, pressure  Eyes:  Negative for eye problems.  Respiratory:  Negative for chest tightness and cough.   Cardiovascular:  Negative for chest pain.  Gastrointestinal:  Negative for abdominal distention, abdominal pain and blood in stool.  Endocrine: Negative for hot flashes.  Genitourinary:  Negative for difficulty urinating and frequency.   Musculoskeletal:  Positive for arthralgias and back pain.  Skin:  Negative for itching and rash.  Neurological:  Positive for headaches. Negative for extremity weakness.  Hematological:  Negative for adenopathy.  Psychiatric/Behavioral:  Negative for confusion.        Feel stressed.    Left breast mass is not palpable on today's breast examination.   PHYSICAL EXAMINATION: ECOG PERFORMANCE STATUS: 1 - Symptomatic but completely ambulatory Vitals:   04/10/22 0944  BP: 120/66  Pulse: 68  Resp: 18  Temp: (!) 96.9 F (36.1 C)   Filed Weights   04/10/22 0944  Weight: 210 lb 12.8 oz (95.6 kg)    Physical Exam Constitutional:      General: She is not in acute distress.    Appearance: She is not diaphoretic.  HENT:     Head: Normocephalic and atraumatic.     Nose: Nose normal.     Mouth/Throat:     Pharynx: No oropharyngeal exudate.  Eyes:     General: No scleral icterus.    Pupils: Pupils are equal, round, and reactive to light.  Cardiovascular:     Rate and Rhythm: Normal rate and regular rhythm.     Heart sounds: No murmur heard. Pulmonary:     Effort: Pulmonary effort is normal. No respiratory distress.     Breath sounds: No rales.  Chest:     Chest wall: No tenderness.  Abdominal:     General: There is no distension.     Palpations: Abdomen is  soft.     Tenderness: There is no abdominal tenderness.  Musculoskeletal:        General: Normal range of motion.     Cervical back: Normal range of motion and neck supple.  Skin:    General: Skin is warm and dry.     Findings: No erythema.  Neurological:     Mental Status: She is alert and oriented to person, place, and time.     Cranial Nerves: No cranial nerve deficit.     Motor: No abnormal muscle tone.     Coordination: Coordination normal.  Psychiatric:        Mood and Affect: Affect normal.     LABORATORY DATA:  I have reviewed the data as listed    Latest Ref Rng & Units 04/10/2022    9:32 AM 03/20/2022   12:57 PM 02/20/2022    8:52 AM  CBC  WBC 4.0 - 10.5 K/uL 4.7  4.7  7.9   Hemoglobin 12.0 - 15.0 g/dL 8.7  8.7  8.2   Hematocrit 36.0 - 46.0 % 27.8  28.0  25.7   Platelets 150 -  400 K/uL 272  262  219       Latest Ref Rng & Units 04/10/2022    9:32 AM 02/27/2022    9:19 AM 02/23/2022    8:52 AM  CMP  Glucose 70 - 99 mg/dL 105  100  100   BUN 6 - 20 mg/dL '19  10  13   '$ Creatinine 0.44 - 1.00 mg/dL 0.86  0.73  0.76   Sodium 135 - 145 mmol/L 137  138  139   Potassium 3.5 - 5.1 mmol/L 4.0  3.8  3.5   Chloride 98 - 111 mmol/L 106  106  107   CO2 22 - 32 mmol/L '25  24  24   '$ Calcium 8.9 - 10.3 mg/dL 8.7  8.5  8.7   Total Protein 6.5 - 8.1 g/dL 6.8     Total Bilirubin 0.3 - 1.2 mg/dL 0.3     Alkaline Phos 38 - 126 U/L 95     AST 15 - 41 U/L 18     ALT 0 - 44 U/L 15         RADIOGRAPHIC STUDIES: I have personally reviewed the radiological images as listed and agreed with the findings in the report. MM Breast Surgical Specimen  Result Date: 03/27/2022 CLINICAL DATA:  Status post attack localized left breast lumpectomy. EXAM: SPECIMEN RADIOGRAPH OF THE LEFT BREAST COMPARISON:  Previous exam(s). FINDINGS: Status post excision of the left breast. The tag and ribbon shaped clip are present within the specimen. IMPRESSION: Specimen radiograph of the left breast.  Electronically Signed   By: Dorise Bullion III M.D.   On: 03/27/2022 16:27  NM Sentinel Node Inj-No Rpt (Breast)  Result Date: 03/27/2022 Sulfur Colloid was injected by the Nuclear Medicine Technologist for sentinel lymph node localization.   [place] LT RADIO FREQUENCY TAG LOC US GUIDE  Result Date: 03/12/2022 CLINICAL DATA:  Patient is status post ultrasound-guided biopsy July 2023 which demonstrated invasive mammary carcinoma at 3 o'clock 6 cm from the nipple (RIBBON clip) as well as nodal disease in the axilla (HYDROMARK clip). Patient is status post neoadjuvant chemotherapy. EXAM: NEEDLE LOCALIZATION OF THE LEFT BREAST WITH ULTRASOUND GUIDANCE x2 COMPARISON:  Previous exam(s). FINDINGS: Patient presents for needle localization prior to . I met with the patient and we discussed the procedure of needle localization including benefits and alternatives. We discussed the high likelihood of a successful procedure. We discussed the risks of the procedure, including infection, bleeding, tissue injury, and further surgery. Informed, written consent was given. The usual time-out protocol was performed immediately prior to the procedure. Site 1: LEFT breast 3 o'clock 6 cm from the nipple. Using ultrasound guidance, sterile technique, 1% lidocaine and a 7 cm radiofrequency ID tag needle [id number]), the residual mass with calcifications at 3 o'clock was localized using a lateral approach. Function was confirmed with an auditory signal from the RF ID tag guide. The images were marked for Dr. Windell Moment. Site 2: LEFT axilla. Extensive ultrasound was performed of the LEFT axilla. HYDROMARK clip is not confidently identified within a LEFT axillary lymph node. An area of shadowing is noted superficial to a lymph node that is morphologically similar to the lymph node of nodal metastatic disease which is favored to reflect the residual HYDROMARK clip status post neoadjuvant chemotherapy. Given morphologic similarity, this lymph  node was targeted for tag placement. Using ultrasound guidance, sterile technique, 1% lidocaine and a 7 cm radiofrequency ID tag [id number]), the LEFT axillary lymph node was localized using  an inferolateral approach. Intranodal placement was confirmed sonographically. Function was confirmed with an auditory signal from the RF ID tag guide. The images were marked for Dr. Windell Moment. IMPRESSION: 1. Radiofrequency ID tag localization of biopsy proven LEFT breast malignancy at 3 o'clock (RIBBON clip). No apparent complications. 2. Radiofrequency ID tag localization of biopsy-proven nodal disease in the LEFT axilla. No apparent complications. 3. Please note that the [facility] clip was not noted to be within the morphological correlate of the previously biopsied lymph node. A shadowing area favored to reflect residual HYDROMARK clip was noted to be superficial to the morphologically similar lymph node. HYDROMARK clip was likely extruded from the lymph node during neoadjuvant chemotherapy response. As such, HYDROMARK clip may not be within the surgical specimen. Electronically Signed   By: Valentino Saxon M.D.   On: 03/12/2022 17:08  [place] LT RADIO FREQUENCY TAG EA ADD LESION LOC US GUIDE  Result Date: 03/12/2022 CLINICAL DATA:  Patient is status post ultrasound-guided biopsy July 2023 which demonstrated invasive mammary carcinoma at 3 o'clock 6 cm from the nipple (RIBBON clip) as well as nodal disease in the axilla (HYDROMARK clip). Patient is status post neoadjuvant chemotherapy. EXAM: NEEDLE LOCALIZATION OF THE LEFT BREAST WITH ULTRASOUND GUIDANCE x2 COMPARISON:  Previous exam(s). FINDINGS: Patient presents for needle localization prior to . I met with the patient and we discussed the procedure of needle localization including benefits and alternatives. We discussed the high likelihood of a successful procedure. We discussed the risks of the procedure, including infection, bleeding, tissue injury, and further surgery.  Informed, written consent was given. The usual time-out protocol was performed immediately prior to the procedure. Site 1: LEFT breast 3 o'clock 6 cm from the nipple. Using ultrasound guidance, sterile technique, 1% lidocaine and a 7 cm radiofrequency ID tag needle [id number]), the residual mass with calcifications at 3 o'clock was localized using a lateral approach. Function was confirmed with an auditory signal from the RF ID tag guide. The images were marked for Dr. Windell Moment. Site 2: LEFT axilla. Extensive ultrasound was performed of the LEFT axilla. HYDROMARK clip is not confidently identified within a LEFT axillary lymph node. An area of shadowing is noted superficial to a lymph node that is morphologically similar to the lymph node of nodal metastatic disease which is favored to reflect the residual HYDROMARK clip status post neoadjuvant chemotherapy. Given morphologic similarity, this lymph node was targeted for tag placement. Using ultrasound guidance, sterile technique, 1% lidocaine and a 7 cm radiofrequency ID tag [id number]), the LEFT axillary lymph node was localized using an inferolateral approach. Intranodal placement was confirmed sonographically. Function was confirmed with an auditory signal from the RF ID tag guide. The images were marked for Dr. Windell Moment. IMPRESSION: 1. Radiofrequency ID tag localization of biopsy proven LEFT breast malignancy at 3 o'clock (RIBBON clip). No apparent complications. 2. Radiofrequency ID tag localization of biopsy-proven nodal disease in the LEFT axilla. No apparent complications. 3. Please note that the [facility] clip was not noted to be within the morphological correlate of the previously biopsied lymph node. A shadowing area favored to reflect residual HYDROMARK clip was noted to be superficial to the morphologically similar lymph node. HYDROMARK clip was likely extruded from the lymph node during neoadjuvant chemotherapy response. As such, HYDROMARK clip may not  be within the surgical specimen. Electronically Signed   By: Valentino Saxon M.D.   On: 03/12/2022 17:08  MM DIAG BREAST TOMO UNI LEFT  Result Date: 03/12/2022 CLINICAL DATA:  Status post  2 site radiofrequency ID tag placement. One is within a biopsy proven malignancy (RIBBON clip). Second is within a axillary lymph node. EXAM: DIAGNOSTIC LEFT MAMMOGRAM POST ULTRASOUND-GUIDED RADIOFREQUENCY ID TAG PLACEMENT COMPARISON:  Previous exam(s). FINDINGS: Mammographic images were obtained following ultrasound-guided radiofrequency ID tag placement. These demonstrate the radiofrequency ID tag in association with the residual mass and calcifications as well as the RIBBON marking clip (RF ID [id number]). RF ID tag is located immediately adjacent to the RIBBON clip. Residual calcifications extend predominately superior and anterior from the RF ID tag. Mammographic images were obtained following ultrasound-guided radiofrequency ID tag placement. These demonstrate the radiofrequency ID tag [id number]) in association with a deep LEFT axillary lymph node. HYDROMARK clip is noted to be separate from the lymph node. IMPRESSION: 1. Appropriate location of the radiofrequency ID tag [phone]) within biopsy proven malignancy at LEFT breast 3 o'clock (RIBBON).Residual calcifications extend predominately superior and anterior from the RF ID tag. 2. Appropriate location of the radiofrequency ID tag [id number]) within biopsy-proven nodal disease in the LEFT axilla. Please note HYDROMARK clip is favored to have been extruded from the lymph node during response to neoadjuvant chemotherapy. Final Assessment: Post Procedure Mammograms for radiofrequency ID tag placement Electronically Signed   By: Valentino Saxon M.D.   On: 03/12/2022 16:56

## 2022-04-10 NOTE — Assessment & Plan Note (Signed)
IV magnesium PRN if Mag <1.7 Magnesium level has normalized.

## 2022-04-10 NOTE — Progress Notes (Signed)
DISCONTINUE ON PATHWAY REGIMEN - Breast     Cycle 1: A cycle is 21 days:     Pertuzumab      Trastuzumab-xxxx      Docetaxel      Carboplatin    Cycles 2 through 6: A cycle is every 21 days:     Pertuzumab      Trastuzumab-xxxx      Docetaxel      Carboplatin   **Always confirm dose/schedule in your pharmacy ordering system**  REASON: Other Reason PRIOR TREATMENT: [id number]: Docetaxel + Carboplatin + Trastuzumab IV + Pertuzumab IV (TCHP IV) q21 Days x 6 Cycles TREATMENT RESPONSE: Complete Response (CR)  START ON PATHWAY REGIMEN - Breast     A cycle is every 21 days:     Trastuzumab-xxxx      Pertuzumab   **Always confirm dose/schedule in your pharmacy ordering system**  Patient Characteristics: Post-Neoadjuvant Therapy and Resection, HER2 Positive, ER Negative, No Residual Disease, Adjuvant Targeted Therapy After Neoadjuvant Chemo/Targeted Therapy Therapeutic Status: Post-Neoadjuvant Therapy and Resection Residual Invasive Disease Post-Neoadjuvant Therapy<= No ER Status: Negative (-) HER2 Status: Positive (+) PR Status: Negative (-) Intent of Therapy: Curative Intent, Discussed with Patient 

## 2022-04-10 NOTE — Assessment & Plan Note (Addendum)
Left breast cancer, cT4b N3 Mx, ER/PR-, HER2 + Overall she tolerates chemotherapy except diarrhea. Clinically left breast mass has responded to treatment. Labs are reviewed and discussed with patient. Echo showed stable LVEF S/p 6 cycles of TCHP  S/p left mastectomy + SLNB -ypT0 ypN0, complete pathological response.  Recommend adjuvant Transtuzumab and Pertuzumab to complete 1 year treatment if she tolerates.  She will also need to establish care with Radonc, will refer at next visit.  -awaiting her recovering from surgery

## 2022-04-10 NOTE — Assessment & Plan Note (Signed)
Hemoglobin is stable. Monitor counts. 

## 2022-04-10 NOTE — Progress Notes (Signed)
Pt here for follow up. Pt reports she is having constant headaches, mostly every day. Sometimes tylenol does not help

## 2022-04-13 ENCOUNTER — Encounter: Payer: Self-pay | Admitting: Licensed Clinical Social Worker

## 2022-04-13 NOTE — Progress Notes (Signed)
UPDATE: VUS in [facility] called [id number] has been reclassified to "Likely Benign." The amended report date is 04/08/2022.

## 2022-04-14 ENCOUNTER — Other Ambulatory Visit: Payer: Self-pay | Admitting: Oncology

## 2022-04-14 DIAGNOSIS — Z171 Estrogen receptor negative status [ER-]: Secondary | ICD-10-CM

## 2022-04-17 ENCOUNTER — Ambulatory Visit
Admission: RE | Admit: 2022-04-17 | Discharge: 2022-04-17 | Disposition: A | Discharge status: Home / Self Care | Payer: Medicare HMO | Source: Ambulatory Visit | Attending: Oncology | Admitting: Oncology

## 2022-04-17 DIAGNOSIS — C50011 Malignant neoplasm of nipple and areola, right female breast: Secondary | ICD-10-CM | POA: Insufficient documentation

## 2022-04-17 DIAGNOSIS — C50812 Malignant neoplasm of overlapping sites of left female breast: Secondary | ICD-10-CM | POA: Insufficient documentation

## 2022-04-17 DIAGNOSIS — Z171 Estrogen receptor negative status [ER-]: Secondary | ICD-10-CM | POA: Diagnosis present

## 2022-04-17 MED ORDER — GADOBUTROL 1 MMOL/ML IV SOLN
9.0000 mL | Freq: Once | INTRAVENOUS | Status: AC | PRN
  Administered 2022-04-17: 9 mL via INTRAVENOUS

## 2022-04-18 ENCOUNTER — Other Ambulatory Visit: Payer: Self-pay | Admitting: Oncology

## 2022-04-18 DIAGNOSIS — E876 Hypokalemia: Secondary | ICD-10-CM

## 2022-04-20 NOTE — Telephone Encounter (Signed)
  Component Ref Range & Units 10 d ago (04/10/22) 1 mo ago (02/27/22) 1 mo ago (02/23/22) 1 mo ago (02/20/22) 2 mo ago (02/17/22) 2 mo ago (02/11/22) 2 mo ago (02/09/22)  Potassium 3.5 - 5.1 mmol/L 4.0 3.8 3.5 3.6 3.5 4.5 4.2

## 2022-04-22 ENCOUNTER — Other Ambulatory Visit: Payer: Self-pay | Admitting: Oncology

## 2022-04-22 ENCOUNTER — Telehealth: Payer: Self-pay | Admitting: Oncology

## 2022-04-22 NOTE — Telephone Encounter (Signed)
Pt called and stated that she has a surgery follow up appt at 10:00 tomorrow ( 04/23/22) and she would not be able to come her to her appt tomorrow. She stated that if we move the appt to 9 she could come. I explained to her that is scheduled for tx and that a 9:00 appt would still run through her 10:00 appt. She stated that she isn't supposed to get tx and only getting fluids. I let her know that I would forward this information to Dr.Yu team for them to advise on when to reschedule her appt.

## 2022-04-23 ENCOUNTER — Inpatient Hospital Stay: Payer: Medicare HMO | Admitting: Oncology

## 2022-04-23 ENCOUNTER — Inpatient Hospital Stay: Payer: Medicare HMO

## 2022-04-23 ENCOUNTER — Other Ambulatory Visit: Payer: Self-pay

## 2022-04-23 DIAGNOSIS — Z171 Estrogen receptor negative status [ER-]: Secondary | ICD-10-CM

## 2022-04-24 ENCOUNTER — Inpatient Hospital Stay: Payer: Medicare HMO

## 2022-04-24 ENCOUNTER — Inpatient Hospital Stay (HOSPITAL_BASED_OUTPATIENT_CLINIC_OR_DEPARTMENT_OTHER): Payer: Medicare HMO | Admitting: Oncology

## 2022-04-24 ENCOUNTER — Other Ambulatory Visit: Payer: Self-pay

## 2022-04-24 ENCOUNTER — Encounter: Payer: Self-pay | Admitting: *Deleted

## 2022-04-24 ENCOUNTER — Encounter: Payer: Self-pay | Admitting: Oncology

## 2022-04-24 VITALS — BP 115/47 | HR 82 | Temp 97.6°F | Resp 20 | Wt 207.3 lb

## 2022-04-24 DIAGNOSIS — F411 Generalized anxiety disorder: Secondary | ICD-10-CM | POA: Diagnosis not present

## 2022-04-24 DIAGNOSIS — C50812 Malignant neoplasm of overlapping sites of left female breast: Secondary | ICD-10-CM | POA: Diagnosis not present

## 2022-04-24 DIAGNOSIS — T451X5A Adverse effect of antineoplastic and immunosuppressive drugs, initial encounter: Secondary | ICD-10-CM

## 2022-04-24 DIAGNOSIS — Z5181 Encounter for therapeutic drug level monitoring: Secondary | ICD-10-CM

## 2022-04-24 DIAGNOSIS — C50011 Malignant neoplasm of nipple and areola, right female breast: Secondary | ICD-10-CM

## 2022-04-24 DIAGNOSIS — C801 Malignant (primary) neoplasm, unspecified: Secondary | ICD-10-CM

## 2022-04-24 DIAGNOSIS — Z79899 Other long term (current) drug therapy: Secondary | ICD-10-CM

## 2022-04-24 DIAGNOSIS — Z5112 Encounter for antineoplastic immunotherapy: Secondary | ICD-10-CM | POA: Diagnosis not present

## 2022-04-24 DIAGNOSIS — Z9109 Other allergy status, other than to drugs and biological substances: Secondary | ICD-10-CM

## 2022-04-24 DIAGNOSIS — D6481 Anemia due to antineoplastic chemotherapy: Secondary | ICD-10-CM | POA: Diagnosis not present

## 2022-04-24 DIAGNOSIS — Z171 Estrogen receptor negative status [ER-]: Secondary | ICD-10-CM

## 2022-04-24 DIAGNOSIS — E876 Hypokalemia: Secondary | ICD-10-CM

## 2022-04-24 LAB — MAGNESIUM: Magnesium: 1.8 mg/dL (ref 1.7–2.4)

## 2022-04-24 LAB — CBC WITH DIFFERENTIAL (CANCER CENTER ONLY)
Abs Immature Granulocytes: 0.03 10*3/uL (ref 0.00–0.07)
Basophils Absolute: 0 10*3/uL (ref 0.0–0.1)
Basophils Relative: 0 %
Eosinophils Absolute: 0 10*3/uL (ref 0.0–0.5)
Eosinophils Relative: 0 %
HCT: 29.3 % — ABNORMAL LOW (ref 36.0–46.0)
Hemoglobin: 9.4 g/dL — ABNORMAL LOW (ref 12.0–15.0)
Immature Granulocytes: 0 %
Lymphocytes Relative: 20 %
Lymphs Abs: 1.7 10*3/uL (ref 0.7–4.0)
MCH: 27.7 pg (ref 26.0–34.0)
MCHC: 32.1 g/dL (ref 30.0–36.0)
MCV: 86.4 fL (ref 80.0–100.0)
Monocytes Absolute: 1 10*3/uL (ref 0.1–1.0)
Monocytes Relative: 12 %
Neutro Abs: 5.7 10*3/uL (ref 1.7–7.7)
Neutrophils Relative %: 68 %
Platelet Count: 241 10*3/uL (ref 150–400)
RBC: 3.39 MIL/uL — ABNORMAL LOW (ref 3.87–5.11)
RDW: 15.8 % — ABNORMAL HIGH (ref 11.5–15.5)
WBC Count: 8.5 10*3/uL (ref 4.0–10.5)
nRBC: 0 % (ref 0.0–0.2)

## 2022-04-24 LAB — CMP (CANCER CENTER ONLY)
ALT: 12 U/L (ref 0–44)
AST: 13 U/L — ABNORMAL LOW (ref 15–41)
Albumin: 3.6 g/dL (ref 3.5–5.0)
Alkaline Phosphatase: 97 U/L (ref 38–126)
Anion gap: 8 (ref 5–15)
BUN: 14 mg/dL (ref 6–20)
CO2: 24 mmol/L (ref 22–32)
Calcium: 8.8 mg/dL — ABNORMAL LOW (ref 8.9–10.3)
Chloride: 102 mmol/L (ref 98–111)
Creatinine: 0.86 mg/dL (ref 0.44–1.00)
GFR, Estimated: >60 mL/min (ref >60)
Glucose, Bld: 118 mg/dL — ABNORMAL HIGH (ref 70–99)
Potassium: 3.8 mmol/L (ref 3.5–5.1)
Sodium: 134 mmol/L — ABNORMAL LOW (ref 135–145)
Total Bilirubin: 0.4 mg/dL (ref 0.3–1.2)
Total Protein: 7.4 g/dL (ref 6.5–8.1)

## 2022-04-24 LAB — IRON AND TIBC
Iron: 17 ug/dL — ABNORMAL LOW (ref 28–170)
Saturation Ratios: 6 % — ABNORMAL LOW (ref 10.4–31.8)
TIBC: 311 ug/dL (ref 250–450)
UIBC: 294 ug/dL

## 2022-04-24 LAB — FERRITIN: Ferritin: 97 ng/mL (ref 11–307)

## 2022-04-24 MED ORDER — SODIUM CHLORIDE 0.9 % IV SOLN
Freq: Once | INTRAVENOUS | Status: AC
  Filled 2022-04-24: qty 250

## 2022-04-24 MED ORDER — ACETAMINOPHEN 325 MG PO TABS
650.0000 mg | ORAL_TABLET | Freq: Once | ORAL | Status: AC
  Administered 2022-04-24: 650 mg via ORAL
  Filled 2022-04-24: qty 2

## 2022-04-24 MED ORDER — HEPARIN SOD (PORK) LOCK FLUSH 100 UNIT/ML IV SOLN
500.0000 [IU] | Freq: Once | INTRAVENOUS | Status: AC | PRN
  Administered 2022-04-24: 500 [IU]
  Filled 2022-04-24: qty 5

## 2022-04-24 MED ORDER — SODIUM CHLORIDE 0.9 % IV SOLN
420.0000 mg | Freq: Once | INTRAVENOUS | Status: AC
  Administered 2022-04-24: 420 mg via INTRAVENOUS
  Filled 2022-04-24: qty 14

## 2022-04-24 MED ORDER — TRASTUZUMAB-ANNS CHEMO 150 MG IV SOLR
6.0000 mg/kg | Freq: Once | INTRAVENOUS | Status: AC
  Administered 2022-04-24: 600 mg via INTRAVENOUS
  Filled 2022-04-24: qty 28.57

## 2022-04-24 MED ORDER — DIPHENHYDRAMINE HCL 25 MG PO CAPS
50.0000 mg | ORAL_CAPSULE | Freq: Once | ORAL | Status: AC
  Administered 2022-04-24: 50 mg via ORAL
  Filled 2022-04-24: qty 2

## 2022-04-24 MED ORDER — DIPHENOXYLATE-ATROPINE 2.5-0.025 MG PO TABS: 1.0000 | ORAL_TABLET | Freq: Four times a day (QID) | ORAL | 0 refills | Status: DC | PRN

## 2022-04-24 NOTE — Assessment & Plan Note (Signed)
Recommend patient to take Claritin 10 mg daily.

## 2022-04-24 NOTE — Assessment & Plan Note (Signed)
Hemoglobin is stable. Monitor counts. 

## 2022-04-24 NOTE — Assessment & Plan Note (Signed)
IV magnesium PRN if Mag <1.7 Magnesium level has normalized.   

## 2022-04-24 NOTE — Assessment & Plan Note (Addendum)
Left breast cancer, cT4b N3 Mx, ER/PR-, HER2 + S/p neoadjuvant chemotherapy 6 cycles of TCHP  S/p left mastectomy + SLNB -ypT0 ypN0, complete pathological response.  Recommend adjuvant Transtuzumab and Pertuzumab to complete 1 year treatment if she tolerates.  Labs reviewed and discussed with patient.  Proceed with trastuzumab and pertuzumab today.  Repeat echocardiogram Refer to radiation oncology for adjuvant radiation. Recommend patient to utilize Lomotil every 6 hour as needed for treatment related diarrhea. Will monitor magnesium level weekly

## 2022-04-24 NOTE — Assessment & Plan Note (Signed)
Recommend low dose Xananx 0.25mg daily PRN 

## 2022-04-24 NOTE — Progress Notes (Signed)
Hematology/Oncology Progress note [phone] [phone]     CHIEF COMPLAINTS/REASON FOR VISIT:  Follow-up for Stage IIIB left breast cancer, ER-, HER2+  ASSESSMENT & PLAN:   Cancer Staging  Breast cancer [facility]) Staging form: Breast, AJCC 8th Edition - Clinical stage from 08/21/2021: Stage IIIB (cT4b, cN3c, cM0, G3, ER-, PR-, HER2+) - Signed by Earlie Server, MD on 09/12/2021  Breast cancer ([place]) Left breast cancer, cT4b N3 Mx, ER/PR-, HER2 + S/p neoadjuvant chemotherapy 6 cycles of TCHP  S/p left mastectomy + SLNB -ypT0 ypN0, complete pathological response.  Recommend adjuvant Transtuzumab and Pertuzumab to complete 1 year treatment if she tolerates.  Labs reviewed and discussed with patient.  Proceed with trastuzumab and pertuzumab today.  Repeat echocardiogram Refer to radiation oncology for adjuvant radiation. Recommend patient to utilize Lomotil every 6 hour as needed for treatment related diarrhea. Will monitor magnesium level weekly  Anemia due to antineoplastic chemotherapy Hemoglobin is stable. Monitor counts.   Anxiety associated with cancer diagnosis ([place]) Recommend low dose Xananx 0.25mg  daily PRN  Hypokalemia continue potassium chloride 75meq twice daily.   Hypomagnesemia IV magnesium PRN if Mag <1.7 Magnesium level has normalized.    Pollen allergy Recommend patient to take Claritin 10 mg daily.    Orders Placed This Encounter  Procedures   Magnesium    Standing Status:   Future    Standing Expiration Date:   [id number]   Basic Metabolic Panel - [place] Only    Standing Status:   Future    Standing Expiration Date:   [id number]   Basic Metabolic Panel - [place] Only    Standing Status:   Future    Standing Expiration Date:   04/24/2023   Magnesium    Standing Status:   Future    Standing Expiration Date:   04/24/2023   Ferritin    Standing Status:   Future    Number of Occurrences:   1    Standing Expiration Date:    04/24/2023   Iron and TIBC    Standing Status:   Future    Number of Occurrences:   1    Standing Expiration Date:   04/24/2023   Ambulatory referral to Radiation Oncology    Referral Priority:   Routine    Referral Type:   Consultation    Referral Reason:   Specialty Services Required    Requested Specialty:   Radiation Oncology    Number of Visits Requested:   1   ECHOCARDIOGRAM COMPLETE    Standing Status:   Future    Standing Expiration Date:   04/24/2023    Order Specific Question:   Where should this test be performed    Answer:   [facility]    Order Specific Question:   Perflutren DEFINITY (image enhancing agent) should be administered unless hypersensitivity or allergy exist    Answer:   Administer Perflutren    Order Specific Question:   Reason for exam-Echo    Answer:   Chemo  [id number]     All questions were answered. The patient knows to call the clinic with any problems questions or concerns.  Return of visit:  1 week lab NP +/- IV magnesium 2-week lab +/- IV magnesium 3 weeks lab MD Transtuzumab + Pertuzumab +/- IV magnesium  Earlie Server, MD, PhD [facility] Hematology Oncology 04/24/2022      HISTORY OF PRESENTING ILLNESS:  Vanessa Fisher is a  62 y.o.  female with PMH listed  below who was referred to me for evaluation of  left breast cancer SUMMARY OF ONCOLOGIC HISTORY: Oncology History  Breast cancer ([place])  07/31/2021 Imaging   Bilateral diagnostic mammogram and US showed 5 centimeter LEFT breast mass associated with pleomorphic calcifications is suspicious for invasive ductal carcinoma.At least 4 LEFT axillary lymph nodes with abnormal morphology   08/21/2021 Cancer Staging   Staging form: Breast, AJCC 8th Edition - Clinical: Stage IIIB (cT4b, cN3c, cM0, G3, ER-, PR-, HER2+) - Signed by Earlie Server, MD on 08/28/2021 Histologic grading system: 3 grade system  -08/21/21 left breast ultrasound-guided biopsy showed invasive mammary carcinoma, grade 3, ER/PR  negative, HER2 positive.  Left axillary lymph node biopsy positive for macro metastatic mammary carcinoma, 8 mm in greatest extent.   08/30/2021 Imaging   MRI brain w wo contrast -No metastatic disease or acute intracranial abnormality. Essentially normal for age MRI appearance of the brain.    09/03/2021 Echocardiogram   1. Left ventricular ejection fraction, by estimation, is 55 to 60%. Left ventricular ejection fraction by 3D volume is 58 %. The left ventricle has normal function. The left ventricle has no regional wall motion abnormalities. There is mild left  ventricular hypertrophy. Left ventricular diastolic parameters were normal.  2. Right ventricular systolic function is normal. The right ventricular size is normal.  3. The mitral valve is normal in structure. No evidence of mitral valve regurgitation.  4. The aortic valve was not well visualized. Aortic valve regurgitation is not visualized   09/10/2021 Imaging   PET scan showed Large hypermetabolic left breast mass and diffuse skin thickening,consistent with primary breast carcinoma. Hypermetabolic lymphadenopathy in the left axilla, left subpectoral region, and left supraclavicular region, consistent with metastatic disease. No evidence of metastatic disease within the abdomen or pelvis   09/12/2021 - 02/13/2022 Chemotherapy   Patient is on Treatment Plan : BREAST  Docetaxel + Carboplatin + Trastuzumab + Pertuzumab  (TCHP) q21d       Genetic Testing   Negative genetic testing. No pathogenic variants identified on the [facility] Common Hereditary Cancers+RNA panel. The report date is 10/26/2021.  The Common Hereditary Cancers Panel + RNA offered by [facility] includes sequencing and/or deletion duplication testing of the following 47 genes: APC, ATM, AXIN2, BARD1, BMPR1A, BRCA1, BRCA2, BRIP1, CDH1, CDKN2A (p14ARF), CDKN2A (p16INK4a), CKD4, CHEK2, CTNNA1, DICER1, EPCAM (Deletion/duplication testing only), GREM1 (promoter region  deletion/duplication testing only), KIT, MEN1, MLH1, MSH2, MSH3, MSH6, MUTYH, NBN, NF1, NHTL1, PALB2, PDGFRA, PMS2, POLD1, POLE, PTEN, RAD50, RAD51C, RAD51D, SDHB, SDHC, SDHD, SMAD4, SMARCA4. STK11, TP53, TSC1, TSC2, and VHL.  The following genes were evaluated for sequence changes only: SDHA and HOXB13 c.251G>A variant only.   02/09/2022 Echocardiogram   LVEF 55 to 60%    03/27/2022 Surgery   S/p left mastectomy + SLNB ypT0 ypN0,    04/24/2022 -  Chemotherapy   Patient is on Treatment Plan : BREAST Trastuzumab  + Pertuzumab q21d x 13 cycles       INTERVAL HISTORY Vanessa Fisher is a 62 y.o. female who has above history reviewed by me today presents for follow up visit for Triple positive breast cancer treatment.  Patient follows up with surgery for seroma aspiration. + Increase of allergy symptoms today.  Right neck tenderness.  No fever or chills.  No diarrhea currently.   MEDICAL HISTORY:  Past Medical History:  Diagnosis Date   Anemia    Anxiety    Arthritis    Asthma    WELL CONTROLLED  Bile acid malabsorption syndrome    Bradycardia    HAD AN ISSUE WITH THIS IN 2016-NO PROBLEMS SINCE   Breast cancer, left breast ([place]) 08/2021   Carpal tunnel syndrome on right    Depression    Diabetes mellitus without complication (HCC)    Diverticulitis    Gastric reflux    GERD (gastroesophageal reflux disease)    Hand pain 05/12/2016   Headache    H/O MIGRAINES   History of kidney stones    H/O   Lumbar radiculitis    Neck pain, chronic    Osteoarthritis of both knees    Panic attacks     SURGICAL HISTORY: Past Surgical History:  Procedure Laterality Date   BREAST BIOPSY Left 08/21/2021   Axilla Bx, Hydromarker, neg   BREAST BIOPSY Left 08/21/2021   [place] Bx, Ribbon Clip, invasive mammary carcinoma   BREAST BIOPSY Left 03/12/2022   [place] LT RADIO FREQUENCY TAG LOC US GUIDE 03/12/2022 [facility]   BREAST BIOPSY Left 03/12/2022   [place] LT RADIO FREQUENCY TAG EA ADD LESION  LOC US GUIDE 03/12/2022 [facility]   CARPAL TUNNEL RELEASE Right 12/06/2017   Procedure: CARPAL TUNNEL RELEASE;  Surgeon: Earnestine Leys, MD;  Location: [facility];  Service: Orthopedics;  Laterality: Right;   COLONOSCOPY WITH PROPOFOL N/A 06/11/2021   Procedure: COLONOSCOPY WITH PROPOFOL;  Surgeon: Lin Landsman, MD;  Location: [facility] ENDOSCOPY;  Service: Gastroenterology;  Laterality: N/A;   DORSAL COMPARTMENT RELEASE Right 12/06/2017   Procedure: RELEASE DORSAL COMPARTMENT (DEQUERVAIN);  Surgeon: Earnestine Leys, MD;  Location: [facility];  Service: Orthopedics;  Laterality: Right;   ESOPHAGOGASTRODUODENOSCOPY (EGD) WITH PROPOFOL N/A 06/11/2021   Procedure: ESOPHAGOGASTRODUODENOSCOPY (EGD) WITH PROPOFOL;  Surgeon: Lin Landsman, MD;  Location: [facility] END[facility]COPY;  Service: Gastroenterology;  Laterality: N/A;   PART MASTECTOMY,RADIO FREQUENCY LOCALIZER,AXILLARY SENTINEL NODE BIOPSY Left 03/27/2022   Procedure: PART MASTECTOMY,RADIO FREQUENCY LOCALIZER,AXILLARY SENTINEL NODE BIOPSY;  Surgeon: Herbert Pun, MD;  Location: [facility] ORS;  Service: General;  Laterality: Left;   PARTIAL HYSTERECTOMY     PORTACATH PLACEMENT N/A 09/24/2021   Procedure: INSERTION PORT-A-CATH;  Surgeon: Herbert Pun, MD;  Location: [facility] ORS;  Service: General;  Laterality: N/A;   TUBAL LIGATION      SOCIAL HISTORY: Social History   Socioeconomic History   Marital status: Single    Spouse name: Not on file   Number of children: Not on file   Years of education: Not on file   Highest education level: Not on file  Occupational History   Not on file  Tobacco Use   Smoking status: Former    Types: Cigarettes   Smokeless tobacco: Never  Vaping Use   Vaping Use: Never used  Substance and Sexual Activity   Alcohol use: No   Drug use: No   Sexual activity: Not on file  Other Topics Concern   Not on file  Social History Narrative   Lives alone   Social Determinants of Health   Financial  Resource Strain: High Risk (08/15/2021)   Overall Financial Resource Strain (CARDIA)    Difficulty of Paying Living Expenses: Very hard  Food Insecurity: Food Insecurity Present (09/09/2021)   Hunger Vital Sign    Worried About Running Out of Food in the Last Year: Often true    Ran Out of Food in the Last Year: Often true  Transportation Needs: No Transportation Needs (08/15/2021)   PRAPARE - Hydrologist (Medical): No    Lack of Transportation (  Non-Medical): No  Physical Activity: Inactive (08/15/2021)   Exercise Vital Sign    Days of Exercise per Week: 0 days    Minutes of Exercise per Session: 0 min  Stress: Stress Concern Present (08/15/2021)   [place]    Feeling of Stress : Rather much  Social Connections: Socially Isolated (08/15/2021)   Social Connection and Isolation Panel [NHANES]    Frequency of Communication with Friends and Family: Once a week    Frequency of Social Gatherings with Friends and Family: Once a week    Attends Religious Services: Never    Marine scientist or Organizations: No    Attends Music therapist: Not on file    Marital Status: Never married  Intimate Partner Violence: Not At Risk (08/15/2021)   Humiliation, Afraid, Rape, and Kick questionnaire    Fear of Current or Ex-Partner: No    Emotionally Abused: No    Physically Abused: No    Sexually Abused: No    FAMILY HISTORY: Family History  Problem Relation Age of Onset   Diabetes Mother    Hypertension Mother    Heart failure Mother    Pancreatic cancer Mother 69   Diabetes Father    Hypertension Father    Breast cancer Maternal Aunt    Cervical cancer Maternal Aunt    Lung cancer Son 15    ALLERGIES:  is allergic to bc powder [aspirin-salicylamide-caffeine], doxycycline, gabapentin, hydrocodone-acetaminophen, latex, penicillin g, penicillins, and shellfish allergy.  MEDICATIONS:   Current Outpatient Medications  Medication Sig Dispense Refill   albuterol (PROVENTIL) (2.5 MG/3ML) 0.083% nebulizer solution Take 3 mLs (2.5 mg total) by nebulization every 4 (four) hours as needed for wheezing or shortness of breath. 75 mL 2   albuterol (VENTOLIN HFA) 108 (90 Base) MCG/ACT inhaler Inhale 2 puffs into the lungs every 6 (six) hours as needed for wheezing or shortness of breath. 18 g 0   ALPRAZolam (XANAX) 0.25 MG tablet Take 1 tablet (0.25 mg total) by mouth daily as needed for anxiety. 30 tablet 0   Azelastine HCl 137 MCG/SPRAY SOLN Place 1 spray into the nose daily. 30 mL 0   cetirizine (ZYRTEC ALLERGY) 10 MG tablet Take 1 tablet (10 mg total) by mouth daily. 90 tablet 0   diphenoxylate-atropine (LOMOTIL) 2.5-0.025 MG tablet Take 1 tablet by mouth 4 (four) times daily as needed for diarrhea or loose stools. 120 tablet 0   esomeprazole (NEXIUM) 40 MG capsule Take 1 capsule (40 mg total) by mouth daily at 12 noon. 30 capsule 3   FLOVENT DISKUS 250 MCG/ACT AEPB Inhale 1 puff into the lungs daily.     fluticasone (FLONASE) 50 MCG/ACT nasal spray Place 2 sprays into both nostrils daily. 15.8 mL 0   lidocaine-prilocaine (EMLA) cream Apply 1 Application topically as needed. 30 g 12   magic mouthwash (multi-ingredient) oral suspension Swish and spit 5-10 ml by mouth 4 times a day as needed 400 mL 1   magnesium chloride (SLOW-MAG) 64 MG TBEC SR tablet Take 1 tablet (64 mg total) by mouth daily. 30 tablet 3   montelukast (SINGULAIR) 10 MG tablet Take by mouth.     ondansetron (ZOFRAN) 8 MG tablet Take 1 tablet (8 mg total) by mouth 2 (two) times daily as needed (Nausea or vomiting). Start on the third day after chemotherapy. 30 tablet 1   oxyCODONE-acetaminophen (PERCOCET/ROXICET) 5-325 MG tablet Take 1 tablet by mouth every 6 (six)  hours as needed for pain.     potassium chloride SA (KLOR-CON M20) 20 MEQ tablet TAKE 1 TABLET BY MOUTH TWICE A DAY 60 tablet 1   prochlorperazine  (COMPAZINE) 10 MG tablet Take 1 tablet (10 mg total) by mouth every 6 (six) hours as needed (Nausea or vomiting). 30 tablet 1   traZODone (DESYREL) 50 MG tablet Take 1 tablet (50 mg total) by mouth at bedtime as needed for sleep. 90 tablet 1   zinc gluconate 50 MG tablet Take 50 mg by mouth as needed.     naloxone (NARCAN) nasal spray 4 mg/0.1 mL  (Patient not taking: Reported on 12/17/2021)     No current facility-administered medications for this visit.   Facility-Administered Medications Ordered in Other Visits  Medication Dose Route Frequency Provider Last Rate Last Admin   heparin lock flush 100 unit/mL  500 Units Intracatheter Once PRN Earlie Server, MD       magnesium sulfate IVPB 2 g 50 mL  2 g Intravenous Once [place], Sarah M, PA-C       pertuzumab (PERJETA) 420 mg in sodium chloride 0.9 % 250 mL chemo infusion  420 mg Intravenous Once Earlie Server, MD 528 mL/hr at 04/24/22 1159 420 mg at 04/24/22 1159    Review of Systems  Constitutional:  Negative for appetite change, chills, fatigue and fever.  HENT:   Negative for hearing loss and voice change.        Nasal congestion, pressure  Eyes:  Negative for eye problems.  Respiratory:  Negative for chest tightness and cough.   Cardiovascular:  Negative for chest pain.  Gastrointestinal:  Negative for abdominal distention, abdominal pain and blood in stool.  Endocrine: Negative for hot flashes.  Genitourinary:  Negative for difficulty urinating and frequency.   Musculoskeletal:  Positive for arthralgias and back pain.  Skin:  Negative for itching and rash.  Neurological:  Positive for headaches. Negative for extremity weakness.  Hematological:  Negative for adenopathy.  Psychiatric/Behavioral:  Negative for confusion.        Feel stressed.    Left breast mass is not palpable on today's breast examination.   PHYSICAL EXAMINATION: ECOG PERFORMANCE STATUS: 1 - Symptomatic but completely ambulatory Vitals:   04/24/22 0911  BP: (!)  115/47  Pulse: 82  Resp: 20  Temp: 97.6 F (36.4 C)  SpO2: 100%   Filed Weights   04/24/22 0911  Weight: 207 lb 4.8 oz (94 kg)    Physical Exam Constitutional:      General: She is not in acute distress.    Appearance: She is not diaphoretic.  HENT:     Head: Normocephalic and atraumatic.     Nose: Nose normal.     Mouth/Throat:     Pharynx: No oropharyngeal exudate.  Eyes:     General: No scleral icterus.    Pupils: Pupils are equal, round, and reactive to light.  Cardiovascular:     Rate and Rhythm: Normal rate and regular rhythm.     Heart sounds: No murmur heard. Pulmonary:     Effort: Pulmonary effort is normal. No respiratory distress.     Breath sounds: No rales.  Chest:     Chest wall: No tenderness.  Abdominal:     General: There is no distension.     Palpations: Abdomen is soft.     Tenderness: There is no abdominal tenderness.  Musculoskeletal:        General: Normal range of motion.  Cervical back: Normal range of motion and neck supple.  Skin:    General: Skin is warm and dry.     Findings: No erythema.  Neurological:     Mental Status: She is alert and oriented to person, place, and time.     Cranial Nerves: No cranial nerve deficit.     Motor: No abnormal muscle tone.     Coordination: Coordination normal.  Psychiatric:        Mood and Affect: Affect normal.     LABORATORY DATA:  I have reviewed the data as listed    Latest Ref Rng & Units 04/24/2022    8:38 AM 04/10/2022    9:32 AM 03/20/2022   12:57 PM  CBC  WBC 4.0 - 10.5 K/uL 8.5  4.7  4.7   Hemoglobin 12.0 - 15.0 g/dL 9.4  8.7  8.7   Hematocrit 36.0 - 46.0 % 29.3  27.8  28.0   Platelets 150 - 400 K/uL 241  272  262       Latest Ref Rng & Units 04/24/2022    8:38 AM 04/10/2022    9:32 AM 02/27/2022    9:19 AM  CMP  Glucose 70 - 99 mg/dL 118  105  100   BUN 6 - 20 mg/dL 14  19  10    Creatinine 0.44 - 1.00 mg/dL 0.86  0.86  0.73   Sodium 135 - 145 mmol/L 134  137  138   Potassium  3.5 - 5.1 mmol/L 3.8  4.0  3.8   Chloride 98 - 111 mmol/L 102  106  106   CO2 22 - 32 mmol/L 24  25  24    Calcium 8.9 - 10.3 mg/dL 8.8  8.7  8.5   Total Protein 6.5 - 8.1 g/dL 7.4  6.8    Total Bilirubin 0.3 - 1.2 mg/dL 0.4  0.3    Alkaline Phos 38 - 126 U/L 97  95    AST 15 - 41 U/L 13  18    ALT 0 - 44 U/L 12  15        RADIOGRAPHIC STUDIES: I have personally reviewed the radiological images as listed and agreed with the findings in the report. 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 Malignant neoplasm of overlapping sites of left breast in female, estrogen receptor negative. Breast cancer, headache. EXAM: MRI HEAD WITHOUT AND WITH CONTRAST TECHNIQUE: Multiplanar, multiecho pulse sequences of the brain and surrounding structures were obtained without and with intravenous contrast. CONTRAST:  55mL GADAVIST GADOBUTROL 1 MMOL/ML IV SOLN COMPARISON:  Brain MRI 08/30/2021. FINDINGS: Brain: Cerebral volume is normal. Unchanged nonspecific 4 mm T2 FLAIR hyperintense remote insult within the left centrum semiovale. Partially empty sella turcica. No cortical encephalomalacia is identified. There is no acute infarct. No evidence of an intracranial mass. No chronic intracranial blood products. No extra-axial fluid collection. No midline shift. No pathologic intracranial enhancement identified. Vascular: Maintained flow voids within the proximal large arterial vessels. Skull and upper cervical spine: No focal suspicious marrow lesion. Sinuses/Orbits: No mass or acute finding within the imaged orbits. Trace mucosal thickening scattered within bilateral ethmoid air cells, and within the right maxillary sinus. IMPRESSION: Stable MRI appearance of the brain as compared to 08/30/2021. No evidence of intracranial metastatic disease. Partially empty sella turcica. This finding can  reflect incidental anatomic variation, or alternatively, it can  be associated with idiopathic intracranial hypertension (pseudotumor cerebri). Electronically Signed   By: Kellie Simmering D.O.   On: 04/17/2022 09:41   MM Breast Surgical Specimen  Result Date: 03/27/2022 CLINICAL DATA:  Status post attack localized left breast lumpectomy. EXAM: SPECIMEN RADIOGRAPH OF THE LEFT BREAST COMPARISON:  Previous exam(s). FINDINGS: Status post excision of the left breast. The tag and ribbon shaped clip are present within the specimen. IMPRESSION: Specimen radiograph of the left breast. Electronically Signed   By: Dorise Bullion III M.D.   On: 03/27/2022 16:27  NM Sentinel Node Inj-No Rpt (Breast)  Result Date: 03/27/2022 Sulfur Colloid was injected by the Nuclear Medicine Technologist for sentinel lymph node localization.

## 2022-04-24 NOTE — Assessment & Plan Note (Signed)
continue potassium chloride 20meq twice daily.  

## 2022-04-24 NOTE — Progress Notes (Signed)
Last dose on 1/10 -ok to proceed today without reloading

## 2022-04-27 ENCOUNTER — Other Ambulatory Visit: Payer: Self-pay | Admitting: Oncology

## 2022-04-27 MED ORDER — IRON-VITAMIN C 65-125 MG PO TABS: 1.0000 | ORAL_TABLET | Freq: Every day | ORAL | 2 refills | Status: DC

## 2022-04-28 ENCOUNTER — Encounter: Payer: Self-pay | Admitting: Oncology

## 2022-04-28 ENCOUNTER — Telehealth: Payer: Self-pay

## 2022-04-28 NOTE — Telephone Encounter (Signed)
-----   Message from Earlie Server, MD sent at 04/27/2022  9:07 PM EDT ----- Please recommend patient to take oral iron supplementation once daily. Rx is sent.

## 2022-04-28 NOTE — Telephone Encounter (Signed)
Called patient and informed her that Dr. Tasia Catchings recommends her to take oral iron supplement once a day and that the Rx has been sent to pharmacy. Patient gave verbal understanding to this.

## 2022-04-29 ENCOUNTER — Telehealth: Payer: Self-pay

## 2022-04-29 NOTE — Telephone Encounter (Signed)
Erroneous entry, please disregard.  Jeral Fruit, RN 04/29/22 10:33 AM

## 2022-05-01 ENCOUNTER — Telehealth: Payer: Self-pay

## 2022-05-01 ENCOUNTER — Inpatient Hospital Stay: Payer: Medicare HMO | Admitting: Nurse Practitioner

## 2022-05-01 ENCOUNTER — Inpatient Hospital Stay: Payer: Medicare HMO

## 2022-05-01 NOTE — Telephone Encounter (Signed)
Spoke with patient in reference to missed appt. Patient states she was unaware of her appt and apologized. She states she would like to r/s. I asked if she was feeling ok and she voiced that she feels good, had a little diarrhea after her tx but ok. Advised I would send scheduling a message to get her r/s. Patient verbalized understanding.

## 2022-05-05 ENCOUNTER — Telehealth: Payer: Self-pay | Admitting: Oncology

## 2022-05-05 ENCOUNTER — Inpatient Hospital Stay: Payer: Medicare HMO | Attending: Oncology

## 2022-05-05 ENCOUNTER — Inpatient Hospital Stay (HOSPITAL_BASED_OUTPATIENT_CLINIC_OR_DEPARTMENT_OTHER): Payer: Medicare HMO | Admitting: Nurse Practitioner

## 2022-05-05 ENCOUNTER — Other Ambulatory Visit: Payer: Self-pay

## 2022-05-05 ENCOUNTER — Inpatient Hospital Stay: Payer: Medicare HMO

## 2022-05-05 ENCOUNTER — Encounter: Payer: Self-pay | Admitting: Nurse Practitioner

## 2022-05-05 VITALS — BP 117/71 | HR 50 | Temp 97.2°F | Resp 20 | Wt 208.0 lb

## 2022-05-05 DIAGNOSIS — K521 Toxic gastroenteritis and colitis: Secondary | ICD-10-CM

## 2022-05-05 DIAGNOSIS — C50011 Malignant neoplasm of nipple and areola, right female breast: Secondary | ICD-10-CM

## 2022-05-05 DIAGNOSIS — Z09 Encounter for follow-up examination after completed treatment for conditions other than malignant neoplasm: Secondary | ICD-10-CM | POA: Diagnosis not present

## 2022-05-05 DIAGNOSIS — R197 Diarrhea, unspecified: Secondary | ICD-10-CM | POA: Insufficient documentation

## 2022-05-05 DIAGNOSIS — Z95828 Presence of other vascular implants and grafts: Secondary | ICD-10-CM

## 2022-05-05 DIAGNOSIS — Z171 Estrogen receptor negative status [ER-]: Secondary | ICD-10-CM | POA: Insufficient documentation

## 2022-05-05 DIAGNOSIS — E876 Hypokalemia: Secondary | ICD-10-CM | POA: Insufficient documentation

## 2022-05-05 DIAGNOSIS — D6481 Anemia due to antineoplastic chemotherapy: Secondary | ICD-10-CM | POA: Insufficient documentation

## 2022-05-05 DIAGNOSIS — Z5112 Encounter for antineoplastic immunotherapy: Secondary | ICD-10-CM | POA: Diagnosis not present

## 2022-05-05 DIAGNOSIS — F419 Anxiety disorder, unspecified: Secondary | ICD-10-CM | POA: Insufficient documentation

## 2022-05-05 DIAGNOSIS — C50812 Malignant neoplasm of overlapping sites of left female breast: Secondary | ICD-10-CM | POA: Diagnosis present

## 2022-05-05 DIAGNOSIS — T451X5A Adverse effect of antineoplastic and immunosuppressive drugs, initial encounter: Secondary | ICD-10-CM

## 2022-05-05 LAB — CBC WITH DIFFERENTIAL (CANCER CENTER ONLY)
Abs Immature Granulocytes: 0.01 10*3/uL (ref 0.00–0.07)
Basophils Absolute: 0 10*3/uL (ref 0.0–0.1)
Basophils Relative: 1 %
Eosinophils Absolute: 0 10*3/uL (ref 0.0–0.5)
Eosinophils Relative: 1 %
HCT: 28.6 % — ABNORMAL LOW (ref 36.0–46.0)
Hemoglobin: 9 g/dL — ABNORMAL LOW (ref 12.0–15.0)
Immature Granulocytes: 0 %
Lymphocytes Relative: 44 %
Lymphs Abs: 1.9 10*3/uL (ref 0.7–4.0)
MCH: 27.2 pg (ref 26.0–34.0)
MCHC: 31.5 g/dL (ref 30.0–36.0)
MCV: 86.4 fL (ref 80.0–100.0)
Monocytes Absolute: 0.4 10*3/uL (ref 0.1–1.0)
Monocytes Relative: 8 %
Neutro Abs: 2 10*3/uL (ref 1.7–7.7)
Neutrophils Relative %: 46 %
Platelet Count: 284 10*3/uL (ref 150–400)
RBC: 3.31 MIL/uL — ABNORMAL LOW (ref 3.87–5.11)
RDW: 15.7 % — ABNORMAL HIGH (ref 11.5–15.5)
WBC Count: 4.2 10*3/uL (ref 4.0–10.5)
nRBC: 0 % (ref 0.0–0.2)

## 2022-05-05 LAB — BASIC METABOLIC PANEL - CANCER CENTER ONLY
Anion gap: 7 (ref 5–15)
BUN: 23 mg/dL — ABNORMAL HIGH (ref 6–20)
CO2: 24 mmol/L (ref 22–32)
Calcium: 8.7 mg/dL — ABNORMAL LOW (ref 8.9–10.3)
Chloride: 105 mmol/L (ref 98–111)
Creatinine: 0.91 mg/dL (ref 0.44–1.00)
GFR, Estimated: >60 mL/min (ref >60)
Glucose, Bld: 100 mg/dL — ABNORMAL HIGH (ref 70–99)
Potassium: 4.1 mmol/L (ref 3.5–5.1)
Sodium: 136 mmol/L (ref 135–145)

## 2022-05-05 LAB — MAGNESIUM: Magnesium: 1.6 mg/dL — ABNORMAL LOW (ref 1.7–2.4)

## 2022-05-05 MED ORDER — HEPARIN SOD (PORK) LOCK FLUSH 100 UNIT/ML IV SOLN
500.0000 [IU] | Freq: Once | INTRAVENOUS | Status: AC
  Administered 2022-05-05: 500 [IU]
  Filled 2022-05-05: qty 5

## 2022-05-05 MED ORDER — SODIUM CHLORIDE 0.9 % IV SOLN
INTRAVENOUS | Status: DC
  Filled 2022-05-05 (×2): qty 250

## 2022-05-05 MED ORDER — SODIUM CHLORIDE 0.9% FLUSH
10.0000 mL | Freq: Once | INTRAVENOUS | Status: AC
  Administered 2022-05-05: 10 mL via INTRAVENOUS
  Filled 2022-05-05: qty 10

## 2022-05-05 MED ORDER — MAGNESIUM SULFATE 2 GM/50ML IV SOLN
2.0000 g | Freq: Once | INTRAVENOUS | Status: AC
  Administered 2022-05-05: 2 g via INTRAVENOUS
  Filled 2022-05-05: qty 50

## 2022-05-05 NOTE — Telephone Encounter (Signed)
Pt will be seeing Lauren today. Lauren/ Amy- FYI

## 2022-05-05 NOTE — Telephone Encounter (Signed)
Good Morning, Vanessa Fisher stopped by the desk this am and wanted to communicate that she would like to make changes to how frequent she has to come in.

## 2022-05-05 NOTE — Progress Notes (Signed)
Hematology/Oncology Progress Note [phone] [phone]   CHIEF COMPLAINTS/REASON FOR VISIT:  Follow-up for Stage IIIB left breast cancer, ER-, HER2+  ASSESSMENT & PLAN:   Cancer Staging  Breast cancer Staging form: Breast, AJCC 8th Edition - Clinical stage from 08/21/2021: Stage IIIB (cT4b, cN3c, cM0, G3, ER-, PR-, HER2+) - Signed by Earlie Server, MD on 09/12/2021  No problem-specific Assessment & Plan notes found for this encounter.  Breast cancer ([place]) Left breast cancer, cT4b N3 Mx, ER/PR-, HER2 + S/p neoadjuvant chemotherapy 6 cycles of TCHP  S/p left mastectomy + SLNB -ypT0 ypN0, complete pathological response.  Recommend adjuvant Trastuzumab and Pertuzumab to complete 1 year treatment if she tolerates.  Awaiting repeat echo.  Awaiting appt with rad onc for adj radiation.   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 Reviewed that 1/2-3/4 of patients experience diarrhea. Currently controlled with lomotil. Continue. G1-2.  Plan for IVF today.    Anemia  Due to chemotherapy and underlying iron deficiency Hemoglobin is stable but decreased. Ferritin was normal but decreased iron sat at 6%. She has started oral iron. Tolerating well.    Anxiety associated with cancer diagnosis  Recommend low dose Xananx 0.25mg  daily PRN   Hypokalemia continue potassium chloride 71meq twice daily.   Hypomagnesemia IV magnesium PRN if Mag <1.7 Magnesium level 1.6 today despite oral slow mag. Plan for Mag 2 g IV today.     Pollen allergy Continue Claritin 10 mg daily.Can add flonase if needed    Orders Placed This Encounter  Procedures   CBC with Differential ([place] Only)    Standing Status:   Future    Standing Expiration Date:   05/05/2023     All questions were answered. The patient knows to call the clinic with any problems questions or concerns.  Return of visit:  IVF and Magnesium today Cancel 4/5 appointments Follow up with Dr Tasia Catchings for  consideration of trastuzumab + Pertuzumab +/- Fluids and magnesium-la  Beckey Rutter, DNP, AGNP-C, [place] at [facility] [phone] (clinic) 05/05/2022      HISTORY OF PRESENTING ILLNESS:  Vanessa Fisher is a  62 y.o.  female with PMH listed below who was referred to me for evaluation of  left breast cancer SUMMARY OF ONCOLOGIC HISTORY: Oncology History  Breast cancer  07/31/2021 Imaging   Bilateral diagnostic mammogram and US showed 5 centimeter LEFT breast mass associated with pleomorphic calcifications is suspicious for invasive ductal carcinoma.At least 4 LEFT axillary lymph nodes with abnormal morphology   08/21/2021 Cancer Staging   Staging form: Breast, AJCC 8th Edition - Clinical: Stage IIIB (cT4b, cN3c, cM0, G3, ER-, PR-, HER2+) - Signed by Earlie Server, MD on 08/28/2021 Histologic grading system: 3 grade system  -08/21/21 left breast ultrasound-guided biopsy showed invasive mammary carcinoma, grade 3, ER/PR negative, HER2 positive.  Left axillary lymph node biopsy positive for macro metastatic mammary carcinoma, 8 mm in greatest extent.   08/30/2021 Imaging   MRI brain w wo contrast -No metastatic disease or acute intracranial abnormality. Essentially normal for age MRI appearance of the brain.    09/03/2021 Echocardiogram   1. Left ventricular ejection fraction, by estimation, is 55 to 60%. Left ventricular ejection fraction by 3D volume is 58 %. The left ventricle has normal function. The left ventricle has no regional wall motion abnormalities. There is mild left  ventricular hypertrophy. Left ventricular diastolic parameters were normal.  2. Right ventricular systolic function is normal. The right ventricular size is normal.  3. The mitral valve is normal in structure. No evidence of mitral valve regurgitation.  4. The aortic valve was not well visualized. Aortic valve regurgitation is not visualized   09/10/2021 Imaging   PET scan showed Large  hypermetabolic left breast mass and diffuse skin thickening,consistent with primary breast carcinoma. Hypermetabolic lymphadenopathy in the left axilla, left subpectoral region, and left supraclavicular region, consistent with metastatic disease. No evidence of metastatic disease within the abdomen or pelvis   09/12/2021 - 02/13/2022 Chemotherapy   Patient is on Treatment Plan : BREAST  Docetaxel + Carboplatin + Trastuzumab + Pertuzumab  (TCHP) q21d       Genetic Testing   Negative genetic testing. No pathogenic variants identified on the [facility] Common Hereditary Cancers+RNA panel. The report date is 10/26/2021.  The Common Hereditary Cancers Panel + RNA offered by [facility] includes sequencing and/or deletion duplication testing of the following 47 genes: APC, ATM, AXIN2, BARD1, BMPR1A, BRCA1, BRCA2, BRIP1, CDH1, CDKN2A (p14ARF), CDKN2A (p16INK4a), CKD4, CHEK2, CTNNA1, DICER1, EPCAM (Deletion/duplication testing only), GREM1 (promoter region deletion/duplication testing only), KIT, MEN1, MLH1, MSH2, MSH3, MSH6, MUTYH, NBN, NF1, NHTL1, PALB2, PDGFRA, PMS2, POLD1, POLE, PTEN, RAD50, RAD51C, RAD51D, SDHB, SDHC, SDHD, SMAD4, SMARCA4. STK11, TP53, TSC1, TSC2, and VHL.  The following genes were evaluated for sequence changes only: SDHA and HOXB13 c.251G>A variant only.   02/09/2022 Echocardiogram   LVEF 55 to 60%    03/27/2022 Surgery   S/p left mastectomy + SLNB ypT0 ypN0,    04/24/2022 -  Chemotherapy   Patient is on Treatment Plan : BREAST Trastuzumab  + Pertuzumab q21d x 13 cycles       INTERVAL HISTORY Vanessa Fisher is a 62 y.o. female who presents for follow up visit for Triple positive breast cancer treatment. She had seroma aspiration recently. Complains of ongoing diarrhea 1-2 episodes per day with fecal urgency. Watery. Controlled with lomotil. Complains of frequent appointments. Has started oral iron. Continues slow mag.    MEDICAL HISTORY:  Past Medical History:  Diagnosis Date    Anemia    Anxiety    Arthritis    Asthma    WELL CONTROLLED   Bile acid malabsorption syndrome    Bradycardia    HAD AN ISSUE WITH THIS IN 2016-NO PROBLEMS SINCE   Breast cancer, left breast 08/2021   Carpal tunnel syndrome on right    Depression    Diabetes mellitus without complication    Diverticulitis    Gastric reflux    GERD (gastroesophageal reflux disease)    Hand pain 05/12/2016   Headache    H/O MIGRAINES   History of kidney stones    H/O   Lumbar radiculitis    Neck pain, chronic    Osteoarthritis of both knees    Panic attacks     SURGICAL HISTORY: Past Surgical History:  Procedure Laterality Date   BREAST BIOPSY Left 08/21/2021   Axilla Bx, Hydromarker, neg   BREAST BIOPSY Left 08/21/2021   [place] Bx, Ribbon Clip, invasive mammary carcinoma   BREAST BIOPSY Left 03/12/2022   [place] LT RADIO FREQUENCY TAG LOC US GUIDE 03/12/2022 [facility]   BREAST BIOPSY Left 03/12/2022   [place] LT RADIO FREQUENCY TAG EA ADD LESION LOC US GUIDE 03/12/2022 [facility]   CARPAL TUNNEL RELEASE Right 12/06/2017   Procedure: CARPAL TUNNEL RELEASE;  Surgeon: Earnestine Leys, MD;  Location: [facility];  Service: Orthopedics;  Laterality: Right;   COLONOSCOPY WITH PROPOFOL N/A 06/11/2021   Procedure: COLONOSCOPY WITH PROPOFOL;  Surgeon:  Lin Landsman, MD;  Location: [facility];  Service: Gastroenterology;  Laterality: N/A;   DORSAL COMPARTMENT RELEASE Right 12/06/2017   Procedure: RELEASE DORSAL COMPARTMENT (DEQUERVAIN);  Surgeon: Earnestine Leys, MD;  Location: [facility];  Service: Orthopedics;  Laterality: Right;   ESOPHAGOGASTRODUODENOSCOPY (EGD) WITH PROPOFOL N/A 06/11/2021   Procedure: ESOPHAGOGASTRODUODENOSCOPY (EGD) WITH PROPOFOL;  Surgeon: Lin Landsman, MD;  Location: [facility] END[facility];  Service: Gastroenterology;  Laterality: N/A;   PART MASTECTOMY,RADIO FREQUENCY LOCALIZER,AXILLARY SENTINEL NODE BIOPSY Left 03/27/2022   Procedure: PART MASTECTOMY,RADIO FREQUENCY  LOCALIZER,AXILLARY SENTINEL NODE BIOPSY;  Surgeon: Herbert Pun, MD;  Location: [facility];  Service: General;  Laterality: Left;   PARTIAL HYSTERECTOMY     PORTACATH PLACEMENT N/A 09/24/2021   Procedure: INSERTION PORT-A-CATH;  Surgeon: Herbert Pun, MD;  Location: [facility];  Service: General;  Laterality: N/A;   TUBAL LIGATION      SOCIAL HISTORY: Social History   Socioeconomic History   Marital status: Single    Spouse name: Not on file   Number of children: Not on file   Years of education: Not on file   Highest education level: Not on file  Occupational History   Not on file  Tobacco Use   Smoking status: Former    Types: Cigarettes   Smokeless tobacco: Never  Vaping Use   Vaping Use: Never used  Substance and Sexual Activity   Alcohol use: No   Drug use: No   Sexual activity: Not on file  Other Topics Concern   Not on file  Social History Narrative   Lives alone   Social Determinants of Health   Financial Resource Strain: High Risk (08/15/2021)   Overall Financial Resource Strain (CARDIA)    Difficulty of Paying Living Expenses: Very hard  Food Insecurity: Food Insecurity Present (09/09/2021)   Hunger Vital Sign    Worried About Running Out of Food in the Last Year: Often true    Ran Out of Food in the Last Year: Often true  Transportation Needs: No Transportation Needs (08/15/2021)   PRAPARE - Hydrologist (Medical): No    Lack of Transportation (Non-Medical): No  Physical Activity: Inactive (08/15/2021)   Exercise Vital Sign    Days of Exercise per Week: 0 days    Minutes of Exercise per Session: 0 min  Stress: Stress Concern Present (08/15/2021)   [place]    Feeling of Stress : Rather much  Social Connections: Socially Isolated (08/15/2021)   Social Connection and Isolation Panel [NHANES]    Frequency of Communication with Friends and Family: Once a  week    Frequency of Social Gatherings with Friends and Family: Once a week    Attends Religious Services: Never    Marine scientist or Organizations: No    Attends Music therapist: Not on file    Marital Status: Never married  Intimate Partner Violence: Not At Risk (08/15/2021)   Humiliation, Afraid, Rape, and Kick questionnaire    Fear of Current or Ex-Partner: No    Emotionally Abused: No    Physically Abused: No    Sexually Abused: No    FAMILY HISTORY: Family History  Problem Relation Age of Onset   Diabetes Mother    Hypertension Mother    Heart failure Mother    Pancreatic cancer Mother 22   Diabetes Father    Hypertension Father    Breast cancer Maternal Aunt  Cervical cancer Maternal Aunt    Lung cancer Son 42    ALLERGIES:  is allergic to bc powder [aspirin-salicylamide-caffeine], doxycycline, gabapentin, hydrocodone-acetaminophen, latex, penicillin g, penicillins, and shellfish allergy.  MEDICATIONS:  Current Outpatient Medications  Medication Sig Dispense Refill   albuterol (PROVENTIL) (2.5 MG/3ML) 0.083% nebulizer solution Take 3 mLs (2.5 mg total) by nebulization every 4 (four) hours as needed for wheezing or shortness of breath. 75 mL 2   albuterol (VENTOLIN HFA) 108 (90 Base) MCG/ACT inhaler Inhale 2 puffs into the lungs every 6 (six) hours as needed for wheezing or shortness of breath. 18 g 0   Azelastine HCl 137 MCG/SPRAY SOLN Place 1 spray into the nose daily. 30 mL 0   cetirizine (ZYRTEC ALLERGY) 10 MG tablet Take 1 tablet (10 mg total) by mouth daily. 90 tablet 0   diphenoxylate-atropine (LOMOTIL) 2.5-0.025 MG tablet Take 1 tablet by mouth 4 (four) times daily as needed for diarrhea or loose stools. 120 tablet 0   esomeprazole (NEXIUM) 40 MG capsule Take 1 capsule (40 mg total) by mouth daily at 12 noon. 30 capsule 3   FLOVENT DISKUS 250 MCG/ACT AEPB Inhale 1 puff into the lungs daily.     fluticasone (FLONASE) 50 MCG/ACT nasal spray  Place 2 sprays into both nostrils daily. 15.8 mL 0   Iron-Vitamin C 65-125 MG TABS Take 1 tablet by mouth daily at 2 PM. 30 tablet 2   lidocaine-prilocaine (EMLA) cream Apply 1 Application topically as needed. 30 g 12   magnesium chloride (SLOW-MAG) 64 MG TBEC SR tablet Take 1 tablet (64 mg total) by mouth daily. 30 tablet 3   montelukast (SINGULAIR) 10 MG tablet Take by mouth.     ondansetron (ZOFRAN) 8 MG tablet Take 1 tablet (8 mg total) by mouth 2 (two) times daily as needed (Nausea or vomiting). Start on the third day after chemotherapy. 30 tablet 1   oxyCODONE-acetaminophen (PERCOCET/ROXICET) 5-325 MG tablet Take 1 tablet by mouth every 6 (six) hours as needed for pain.     potassium chloride SA (KLOR-CON M20) 20 MEQ tablet TAKE 1 TABLET BY MOUTH TWICE A DAY 60 tablet 1   prochlorperazine (COMPAZINE) 10 MG tablet Take 1 tablet (10 mg total) by mouth every 6 (six) hours as needed (Nausea or vomiting). 30 tablet 1   tamsulosin (FLOMAX) 0.4 MG CAPS capsule Take 0.4 mg by mouth daily.     traZODone (DESYREL) 50 MG tablet Take 1 tablet (50 mg total) by mouth at bedtime as needed for sleep. 90 tablet 1   zinc gluconate 50 MG tablet Take 50 mg by mouth as needed.     ALPRAZolam (XANAX) 0.25 MG tablet Take 1 tablet (0.25 mg total) by mouth daily as needed for anxiety. (Patient not taking: Reported on 05/05/2022) 30 tablet 0   magic mouthwash (multi-ingredient) oral suspension Swish and spit 5-10 ml by mouth 4 times a day as needed (Patient not taking: Reported on 05/05/2022) 400 mL 1   naloxone (NARCAN) nasal spray 4 mg/0.1 mL  (Patient not taking: Reported on 12/17/2021)     No current facility-administered medications for this visit.   Facility-Administered Medications Ordered in Other Visits  Medication Dose Route Frequency Provider Last Rate Last Admin   magnesium sulfate IVPB 2 g 50 mL  2 g Intravenous Once [place], Sarah M, PA-C        Review of Systems  Constitutional:  Positive for  fatigue. Negative for appetite change, chills, fever and unexpected weight  change.  HENT:   Negative for mouth sores, sore throat and trouble swallowing.   Respiratory:  Negative for chest tightness and shortness of breath.   Cardiovascular:  Negative for leg swelling.  Gastrointestinal:  Positive for diarrhea. Negative for abdominal pain, constipation, nausea and vomiting.  Genitourinary:  Negative for bladder incontinence and dysuria.   Musculoskeletal:  Negative for flank pain and neck stiffness.  Skin:  Negative for itching, rash and wound.  Neurological:  Negative for dizziness, headaches, light-headedness and numbness.  Hematological:  Negative for adenopathy. Does not bruise/bleed easily.  Psychiatric/Behavioral:  Positive for sleep disturbance. Negative for confusion and depression. The patient is not nervous/anxious.    Left breast mass is not palpable on today's breast examination.   PHYSICAL EXAMINATION: ECOG PERFORMANCE STATUS: 1 - Symptomatic but completely ambulatory Vitals:   05/05/22 0945  BP: 117/71  Pulse: (!) 50  Resp: 20  Temp: (!) 97.2 F (36.2 C)   There were no vitals filed for this visit.   Physical Exam Constitutional:      Appearance: She is not ill-appearing.  Cardiovascular:     Rate and Rhythm: Normal rate and regular rhythm.  Pulmonary:     Effort: No respiratory distress.  Abdominal:     General: There is no distension.     Palpations: Abdomen is soft.     Tenderness: There is no abdominal tenderness. There is no guarding.  Musculoskeletal:        General: No deformity.     Right lower leg: No edema.     Left lower leg: No edema.  Lymphadenopathy:     Cervical: No cervical adenopathy.  Skin:    General: Skin is warm and dry.  Neurological:     Mental Status: She is alert and oriented to person, place, and time. Mental status is at baseline.  Psychiatric:        Mood and Affect: Mood normal.        Behavior: Behavior normal.      LABORATORY DATA:  I have reviewed the data as listed    Latest Ref Rng & Units 04/24/2022    8:38 AM 04/10/2022    9:32 AM 03/20/2022   12:57 PM  CBC  WBC 4.0 - 10.5 K/uL 8.5  4.7  4.7   Hemoglobin 12.0 - 15.0 g/dL 9.4  8.7  8.7   Hematocrit 36.0 - 46.0 % 29.3  27.8  28.0   Platelets 150 - 400 K/uL 241  272  262       Latest Ref Rng & Units 04/24/2022    8:38 AM 04/10/2022    9:32 AM 02/27/2022    9:19 AM  CMP  Glucose 70 - 99 mg/dL 118  105  100   BUN 6 - 20 mg/dL 14  19  10    Creatinine 0.44 - 1.00 mg/dL 0.86  0.86  0.73   Sodium 135 - 145 mmol/L 134  137  138   Potassium 3.5 - 5.1 mmol/L 3.8  4.0  3.8   Chloride 98 - 111 mmol/L 102  106  106   CO2 22 - 32 mmol/L 24  25  24    Calcium 8.9 - 10.3 mg/dL 8.8  8.7  8.5   Total Protein 6.5 - 8.1 g/dL 7.4  6.8    Total Bilirubin 0.3 - 1.2 mg/dL 0.4  0.3    Alkaline Phos 38 - 126 U/L 97  95    AST 15 - 41 U/L  13  18    ALT 0 - 44 U/L 12  15     Iron/TIBC/Ferritin/ %Sat    Component Value Date/Time   IRON 17 (L) 04/24/2022 0838   TIBC 311 04/24/2022 0838   FERRITIN 97 04/24/2022 0838   IRONPCTSAT 6 (L) 04/24/2022 [id number]      RADIOGRAPHIC STUDIES: I have personally reviewed the radiological images as listed and agreed with the findings in the report. 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 Malignant neoplasm of overlapping sites of left breast in female, estrogen receptor negative. Breast cancer, headache. EXAM: MRI HEAD WITHOUT AND WITH CONTRAST TECHNIQUE: Multiplanar, multiecho pulse sequences of the brain and surrounding structures were obtained without and with intravenous contrast. CONTRAST:  59mL GADAVIST GADOBUTROL 1 MMOL/ML IV SOLN COMPARISON:  Brain MRI 08/30/2021. FINDINGS: Brain: Cerebral volume is normal. Unchanged nonspecific 4 mm T2 FLAIR hyperintense remote insult within the left  centrum semiovale. Partially empty sella turcica. No cortical encephalomalacia is identified. There is no acute infarct. No evidence of an intracranial mass. No chronic intracranial blood products. No extra-axial fluid collection. No midline shift. No pathologic intracranial enhancement identified. Vascular: Maintained flow voids within the proximal large arterial vessels. Skull and upper cervical spine: No focal suspicious marrow lesion. Sinuses/Orbits: No mass or acute finding within the imaged orbits. Trace mucosal thickening scattered within bilateral ethmoid air cells, and within the right maxillary sinus. IMPRESSION: Stable MRI appearance of the brain as compared to 08/30/2021. No evidence of intracranial metastatic disease. Partially empty sella turcica. This finding can reflect incidental anatomic variation, or alternatively, it can be associated with idiopathic intracranial hypertension (pseudotumor cerebri). Electronically Signed   By: Kellie Simmering D.O.   On: 04/17/2022 09:41

## 2022-05-06 ENCOUNTER — Ambulatory Visit
Admission: RE | Admit: 2022-05-06 | Discharge: 2022-05-06 | Disposition: A | Discharge status: Home / Self Care | Payer: Medicare HMO | Source: Ambulatory Visit | Attending: Oncology | Admitting: Oncology

## 2022-05-06 DIAGNOSIS — C50011 Malignant neoplasm of nipple and areola, right female breast: Secondary | ICD-10-CM | POA: Insufficient documentation

## 2022-05-06 DIAGNOSIS — Z5181 Encounter for therapeutic drug level monitoring: Secondary | ICD-10-CM | POA: Diagnosis not present

## 2022-05-06 DIAGNOSIS — Z0189 Encounter for other specified special examinations: Secondary | ICD-10-CM | POA: Diagnosis not present

## 2022-05-06 DIAGNOSIS — E119 Type 2 diabetes mellitus without complications: Secondary | ICD-10-CM | POA: Insufficient documentation

## 2022-05-06 DIAGNOSIS — I071 Rheumatic tricuspid insufficiency: Secondary | ICD-10-CM | POA: Insufficient documentation

## 2022-05-06 DIAGNOSIS — Z79899 Other long term (current) drug therapy: Secondary | ICD-10-CM | POA: Diagnosis not present

## 2022-05-06 LAB — ECHOCARDIOGRAM COMPLETE
AR max vel: 3.27 cm2
AV Area VTI: 2.9 cm2
AV Area mean vel: 2.92 cm2
AV Mean grad: 5 mmHg
AV Peak grad: 8.2 mmHg
Ao pk vel: 1.43 m/s
Area-P 1/2: 3.79 cm2
MV VTI: 3.34 cm2
S' Lateral: 3.1 cm

## 2022-05-06 NOTE — Progress Notes (Signed)
*  PRELIMINARY RESULTS* Echocardiogram 2D Echocardiogram has been performed.  [name] 05/06/2022, 11:15 AM

## 2022-05-07 ENCOUNTER — Encounter: Payer: Self-pay | Admitting: Radiation Oncology

## 2022-05-07 ENCOUNTER — Ambulatory Visit
Admission: RE | Admit: 2022-05-07 | Discharge: 2022-05-07 | Disposition: A | Discharge status: Home / Self Care | Payer: Medicare HMO | Source: Ambulatory Visit | Attending: Radiation Oncology | Admitting: Radiation Oncology

## 2022-05-07 VITALS — BP 122/80 | HR 61 | Temp 96.9°F | Resp 16 | Ht 66.0 in | Wt 211.2 lb

## 2022-05-07 DIAGNOSIS — Z171 Estrogen receptor negative status [ER-]: Secondary | ICD-10-CM

## 2022-05-07 DIAGNOSIS — Z510 Encounter for antineoplastic radiation therapy: Secondary | ICD-10-CM | POA: Insufficient documentation

## 2022-05-07 DIAGNOSIS — C50012 Malignant neoplasm of nipple and areola, left female breast: Secondary | ICD-10-CM | POA: Insufficient documentation

## 2022-05-07 NOTE — Consult Note (Signed)
NEW PATIENT EVALUATION  Name: Vanessa Fisher  MRN: [id number]  Date:   05/07/2022     DOB: 05-Jun-1960   This 62 y.o. 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   PREVIOUS INVESTIGATIONS:  Mammogram ultrasound and PET scan reviewed Surgical pathology reports reviewed Clinical notes reviewed  HPI: Patient is a 62 year old female who originally presented with abnormal mammogram showing a 5 cm left breast mass associated with pleomorphic calcifications suspicious for invasive mammary carcinoma she also had a least 4 left axillary lymph nodes with abnormal morphology.  Biopsy was positive for ER/PR negative HER2/neu overexpressed invasive mammary carcinoma.  PET CT scan showed live patient underwent neoadjuvant chemotherapy with Taxol carboplatinum and Trastuzumab + Pertuzumab.  She tolerated that well.  Went on to have a wide local excision showing complete response with no evidence of residual disease in the breast.  She also had 8 lymph nodes removed all negative for metastatic disease.  She is doing well.  She specifically denies breast tenderness cough or bone pain she is not experiencing any significant swelling of her left upper extremity.  She is now referred to radiation oncology for consideration of treatment  PLANNED TREATMENT REGIMEN: Left whole breast and peripheral lymphatic radiation  PAST MEDICAL HISTORY:  has a past medical history of Anemia,  Anxiety, Arthritis, Asthma, Bile acid malabsorption syndrome, Bradycardia, Breast cancer, left breast (08/2021), Carpal tunnel syndrome on right, Depression, Diabetes mellitus without complication, Diverticulitis, Gastric reflux, GERD (gastroesophageal reflux disease), Hand pain (05/12/2016), Headache, History of kidney stones, Lumbar radiculitis, Neck pain, chronic, Osteoarthritis of both knees, and Panic attacks.    PAST SURGICAL HISTORY:  Past Surgical History:  Procedure Laterality Date   BREAST BIOPSY Left 08/21/2021   Axilla Bx, Hydromarker, neg   BREAST BIOPSY Left 08/21/2021   [place] Bx, Ribbon Clip, invasive mammary carcinoma   BREAST BIOPSY Left 03/12/2022   [place] LT RADIO FREQUENCY TAG LOC US GUIDE 03/12/2022 [facility]   BREAST BIOPSY Left 03/12/2022   [place] LT RADIO FREQUENCY TAG EA ADD LESION LOC US GUIDE 03/12/2022 [facility]   CARPAL TUNNEL RELEASE Right 12/06/2017   Procedure: CARPAL TUNNEL RELEASE;  Surgeon: Earnestine Leys, MD;  Location: [facility];  Service: Orthopedics;  Laterality: Right;   COLONOSCOPY WITH PROPOFOL N/A 06/11/2021   Procedure: COLONOSCOPY WITH PROPOFOL;  Surgeon: Lin Landsman, MD;  Location: [facility];  Service: Gastroenterology;  Laterality: N/A;   DORSAL COMPARTMENT RELEASE Right 12/06/2017   Procedure: RELEASE DORSAL COMPARTMENT (DEQUERVAIN);  Surgeon: Earnestine Leys, MD;  Location: [facility];  Service: Orthopedics;  Laterality: Right;   ESOPHAGOGASTRODUODENOSCOPY (EGD) WITH PROPOFOL N/A 06/11/2021   Procedure: ESOPHAGOGASTRODUODENOSCOPY (EGD) WITH PROPOFOL;  Surgeon: Lin Landsman, MD;  Location: [facility] ENDOSCOPY;  Service: Gastroenterology;  Laterality: N/A;   PART MASTECTOMY,RADIO FREQUENCY LOCALIZER,AXILLARY SENTINEL NODE BIOPSY Left 03/27/2022   Procedure: PART MASTECTOMY,RADIO FREQUENCY LOCALIZER,AXILLARY SENTINEL NODE BIOPSY;  Surgeon: Herbert Pun, MD;  Location: [facility];  Service: General;  Laterality: Left;   PARTIAL  HYSTERECTOMY     PORTACATH PLACEMENT  N/A 09/24/2021   Procedure: INSERTION PORT-A-CATH;  Surgeon: Herbert Pun, MD;  Location: [facility];  Service: General;  Laterality: N/A;   TUBAL LIGATION      FAMILY HISTORY: family history includes Breast cancer in her maternal aunt; Cervical cancer in her maternal aunt; Diabetes in her father and mother; Heart failure in her mother; Hypertension in her father and mother; Lung cancer (age of onset: 83) in her son; Pancreatic cancer (age of onset: 75) in her mother.  SOCIAL HISTORY:  reports that she has quit smoking. Her smoking use included cigarettes. She has never used smokeless tobacco. She reports that she does not drink alcohol and does not use drugs.  ALLERGIES: Bc powder [aspirin-salicylamide-caffeine], Doxycycline, Gabapentin, Hydrocodone-acetaminophen, Latex, Penicillin g, Penicillins, and Shellfish allergy  MEDICATIONS:  Current Outpatient Medications  Medication Sig Dispense Refill   albuterol (PROVENTIL) (2.5 MG/3ML) 0.083% nebulizer solution Take 3 mLs (2.5 mg total) by nebulization every 4 (four) hours as needed for wheezing or shortness of breath. 75 mL 2   albuterol (VENTOLIN HFA) 108 (90 Base) MCG/ACT inhaler Inhale 2 puffs into the lungs every 6 (six) hours as needed for wheezing or shortness of breath. 18 g 0   Azelastine HCl 137 MCG/SPRAY SOLN Place 1 spray into the nose daily. 30 mL 0   cetirizine (ZYRTEC ALLERGY) 10 MG tablet Take 1 tablet (10 mg total) by mouth daily. 90 tablet 0   diphenoxylate-atropine (LOMOTIL) 2.5-0.025 MG tablet Take 1 tablet by mouth 4 (four) times daily as needed for diarrhea or loose stools. 120 tablet 0   esomeprazole (NEXIUM) 40 MG capsule Take 1 capsule (40 mg total) by mouth daily at 12 noon. 30 capsule 3   FLOVENT DISKUS 250 MCG/ACT AEPB Inhale 1 puff into the lungs daily.     fluticasone (FLONASE) 50 MCG/ACT nasal spray Place 2 sprays into both nostrils daily. 15.8 mL 0   Iron-Vitamin C 65-125  MG TABS Take 1 tablet by mouth daily at 2 PM. 30 tablet 2   lidocaine-prilocaine (EMLA) cream Apply 1 Application topically as needed. 30 g 12   magnesium chloride (SLOW-MAG) 64 MG TBEC SR tablet Take 1 tablet (64 mg total) by mouth daily. 30 tablet 3   montelukast (SINGULAIR) 10 MG tablet Take by mouth.     ondansetron (ZOFRAN) 8 MG tablet Take 1 tablet (8 mg total) by mouth 2 (two) times daily as needed (Nausea or vomiting). Start on the third day after chemotherapy. 30 tablet 1   oxyCODONE-acetaminophen (PERCOCET/ROXICET) 5-325 MG tablet Take 1 tablet by mouth every 6 (six) hours as needed for pain.     potassium chloride SA (KLOR-CON M20) 20 MEQ tablet TAKE 1 TABLET BY MOUTH TWICE A DAY 60 tablet 1   prochlorperazine (COMPAZINE) 10 MG tablet Take 1 tablet (10 mg total) by mouth every 6 (six) hours as needed (Nausea or vomiting). 30 tablet 1   tamsulosin (FLOMAX) 0.4 MG CAPS capsule Take 0.4 mg by mouth daily.     traZODone (DESYREL) 50 MG tablet Take 1 tablet (50 mg total) by mouth at bedtime as needed for sleep. 90 tablet 1   zinc gluconate 50 MG tablet Take 50 mg by mouth as needed.     magic mouthwash (multi-ingredient) oral suspension Swish and spit 5-10 ml by mouth 4 times a day as needed (Patient not taking: Reported on 05/05/2022) 400 mL 1   naloxone (NARCAN) nasal spray 4 mg/0.1 mL  (Patient not taking: Reported on 12/17/2021)  No current facility-administered medications for this encounter.   Facility-Administered Medications Ordered in Other Encounters  Medication Dose Route Frequency Provider Last Rate Last Admin   magnesium sulfate IVPB 2 g 50 mL  2 g Intravenous Once C[place]ton, Sarah M, PA-C        ECOG PERFORMANCE STATUS:  0 - Asymptomatic  REVIEW OF SYSTEMS: Patient denies any weight loss, fatigue, weakness, fever, chills or night sweats. Patient denies any loss of vision, blurred vision. Patient denies any ringing  of the ears or hearing loss. No irregular heartbeat.  Patient denies heart murmur or history of fainting. Patient denies any chest pain or pain radiating to her upper extremities. Patient denies any shortness of breath, difficulty breathing at night, cough or hemoptysis. Patient denies any swelling in the lower legs. Patient denies any nausea vomiting, vomiting of blood, or coffee ground material in the vomitus. Patient denies any stomach pain. Patient states has had normal bowel movements no significant constipation or diarrhea. Patient denies any dysuria, hematuria or significant nocturia. Patient denies any problems walking, swelling in the joints or loss of balance. Patient denies any skin changes, loss of hair or loss of weight. Patient denies any excessive worrying or anxiety or significant depression. Patient denies any problems with insomnia. Patient denies excessive thirst, polyuria, polydipsia. Patient denies any swollen glands, patient denies easy bruising or easy bleeding. Patient denies any recent infections, allergies or URI. Patient "s visual fields have not changed significantly in recent time.   PHYSICAL EXAM: BP 122/80   Pulse 61   Temp (!) 96.9 F (36.1 C) (Tympanic)   Resp 16   Ht 5\' 6"  (1.676 m) Comment: stated ht  Wt 211 lb 3.2 oz (95.8 kg)   BMI 34.09 kg/m  Patient status post wide local excision of the left breast as well as axillary dissection.  Incisions are well-healed.  No dominant masses noted in either breast.  No axillary or supraclavicular adenopathy is identified.  Well-developed well-nourished patient in NAD. HEENT reveals PERLA, EOMI, discs not visualized.  Oral cavity is clear. No oral mucosal lesions are identified. Neck is clear without evidence of cervical or supraclavicular adenopathy. Lungs are clear to A&P. Cardiac examination is essentially unremarkable with regular rate and rhythm without murmur rub or thrill. Abdomen is benign with no organomegaly or masses noted. Motor sensory and DTR levels are equal and  symmetric in the upper and lower extremities. Cranial nerves II through XII are grossly intact. Proprioception is intact. No peripheral adenopathy or edema is identified. No motor or sensory levels are noted. Crude visual fields are within normal range.  LABORATORY DATA: Pathology reports reviewed    RADIOLOGY RESULTS: Mammogram ultrasound and PET scan reviewed compatible with above-stated findings   IMPRESSION: Initial stage IIIb ER/PR negative HER2/neu overexpressed status post neoadjuvant chemotherapy with complete response at the time of wide local excision and axillary dissection in 62 year old female  PLAN: Based on her poor prognostic factors including multiple lymph node involvement by PET/CT criteria as well as breast mass over 4 cm would recommend adjuvant radiation therapy to her left breast and peripheral lymphatics.  I would plan on delivering 5040 centigrade in 28 fractions to both areas boosting her scar another 1000 centigrade.  Risks and benefits of treatment including skin reaction fatigue alteration of blood counts possible inclusion of superficial lung slight chance of lymphedema of her left upper extremity all were reviewed in detail with the patient.  She comprehends my recommendations well.  She continues  on immunotherapy for her overexpressed HER2/neu disease.  Patient comprehends my recommendations well.  I would like to take this opportunity to thank you for allowing me to participate in the care of your patient.Noreene Filbert, MD

## 2022-05-08 ENCOUNTER — Inpatient Hospital Stay: Payer: Medicare HMO

## 2022-05-13 ENCOUNTER — Ambulatory Visit: Payer: Medicare HMO

## 2022-05-15 ENCOUNTER — Encounter: Payer: Self-pay | Admitting: Oncology

## 2022-05-15 ENCOUNTER — Inpatient Hospital Stay: Payer: Medicare HMO

## 2022-05-15 ENCOUNTER — Other Ambulatory Visit: Payer: Self-pay

## 2022-05-15 ENCOUNTER — Inpatient Hospital Stay (HOSPITAL_BASED_OUTPATIENT_CLINIC_OR_DEPARTMENT_OTHER): Payer: Medicare HMO | Admitting: Oncology

## 2022-05-15 VITALS — BP 110/55 | HR 55 | Temp 96.9°F | Resp 18 | Wt 209.7 lb

## 2022-05-15 DIAGNOSIS — C50911 Malignant neoplasm of unspecified site of right female breast: Secondary | ICD-10-CM | POA: Diagnosis not present

## 2022-05-15 DIAGNOSIS — T451X5A Adverse effect of antineoplastic and immunosuppressive drugs, initial encounter: Secondary | ICD-10-CM

## 2022-05-15 DIAGNOSIS — C50011 Malignant neoplasm of nipple and areola, right female breast: Secondary | ICD-10-CM

## 2022-05-15 DIAGNOSIS — Z171 Estrogen receptor negative status [ER-]: Secondary | ICD-10-CM

## 2022-05-15 DIAGNOSIS — Z5112 Encounter for antineoplastic immunotherapy: Secondary | ICD-10-CM | POA: Diagnosis not present

## 2022-05-15 DIAGNOSIS — E876 Hypokalemia: Secondary | ICD-10-CM

## 2022-05-15 DIAGNOSIS — C50812 Malignant neoplasm of overlapping sites of left female breast: Secondary | ICD-10-CM | POA: Diagnosis not present

## 2022-05-15 DIAGNOSIS — F411 Generalized anxiety disorder: Secondary | ICD-10-CM

## 2022-05-15 DIAGNOSIS — D6481 Anemia due to antineoplastic chemotherapy: Secondary | ICD-10-CM

## 2022-05-15 DIAGNOSIS — C801 Malignant (primary) neoplasm, unspecified: Secondary | ICD-10-CM

## 2022-05-15 LAB — CBC WITH DIFFERENTIAL (CANCER CENTER ONLY)
Abs Immature Granulocytes: 0.01 10*3/uL (ref 0.00–0.07)
Basophils Absolute: 0 10*3/uL (ref 0.0–0.1)
Basophils Relative: 1 %
Eosinophils Absolute: 0 10*3/uL (ref 0.0–0.5)
Eosinophils Relative: 1 %
HCT: 30 % — ABNORMAL LOW (ref 36.0–46.0)
Hemoglobin: 9.3 g/dL — ABNORMAL LOW (ref 12.0–15.0)
Immature Granulocytes: 0 %
Lymphocytes Relative: 49 %
Lymphs Abs: 2 10*3/uL (ref 0.7–4.0)
MCH: 27 pg (ref 26.0–34.0)
MCHC: 31 g/dL (ref 30.0–36.0)
MCV: 87 fL (ref 80.0–100.0)
Monocytes Absolute: 0.4 10*3/uL (ref 0.1–1.0)
Monocytes Relative: 9 %
Neutro Abs: 1.6 10*3/uL — ABNORMAL LOW (ref 1.7–7.7)
Neutrophils Relative %: 40 %
Platelet Count: 224 10*3/uL (ref 150–400)
RBC: 3.45 MIL/uL — ABNORMAL LOW (ref 3.87–5.11)
RDW: 15.5 % (ref 11.5–15.5)
WBC Count: 3.9 10*3/uL — ABNORMAL LOW (ref 4.0–10.5)
nRBC: 0 % (ref 0.0–0.2)

## 2022-05-15 LAB — HEPATIC FUNCTION PANEL
ALT: 11 U/L (ref 0–44)
AST: 18 U/L (ref 15–41)
Albumin: 3.7 g/dL (ref 3.5–5.0)
Alkaline Phosphatase: 85 U/L (ref 38–126)
Bilirubin, Direct: <0.1 mg/dL (ref 0.0–0.2)
Total Bilirubin: 0.2 mg/dL — ABNORMAL LOW (ref 0.3–1.2)
Total Protein: 7.3 g/dL (ref 6.5–8.1)

## 2022-05-15 LAB — BASIC METABOLIC PANEL - CANCER CENTER ONLY
Anion gap: 6 (ref 5–15)
BUN: 18 mg/dL (ref 6–20)
CO2: 25 mmol/L (ref 22–32)
Calcium: 9.2 mg/dL (ref 8.9–10.3)
Chloride: 106 mmol/L (ref 98–111)
Creatinine: 0.92 mg/dL (ref 0.44–1.00)
GFR, Estimated: >60 mL/min (ref >60)
Glucose, Bld: 106 mg/dL — ABNORMAL HIGH (ref 70–99)
Potassium: 4.2 mmol/L (ref 3.5–5.1)
Sodium: 137 mmol/L (ref 135–145)

## 2022-05-15 LAB — MAGNESIUM: Magnesium: 1.4 mg/dL — ABNORMAL LOW (ref 1.7–2.4)

## 2022-05-15 MED ORDER — SLOW-MAG 71.5-119 MG PO TBEC
1.0000 | DELAYED_RELEASE_TABLET | Freq: Every day | ORAL | 3 refills | Status: DC
Start: 2022-05-15 — End: 2022-10-14

## 2022-05-15 MED ORDER — ACETAMINOPHEN 325 MG PO TABS
650.0000 mg | ORAL_TABLET | Freq: Once | ORAL | Status: AC
  Administered 2022-05-15: 650 mg via ORAL
  Filled 2022-05-15: qty 2

## 2022-05-15 MED ORDER — HEPARIN SOD (PORK) LOCK FLUSH 100 UNIT/ML IV SOLN
500.0000 [IU] | Freq: Once | INTRAVENOUS | Status: DC | PRN
  Filled 2022-05-15: qty 5

## 2022-05-15 MED ORDER — MAGNESIUM SULFATE 4 GM/100ML IV SOLN
4.0000 g | Freq: Once | INTRAVENOUS | Status: AC
  Administered 2022-05-15: 4 g via INTRAVENOUS
  Filled 2022-05-15: qty 100

## 2022-05-15 MED ORDER — SODIUM CHLORIDE 0.9% FLUSH
10.0000 mL | INTRAVENOUS | Status: DC | PRN
  Administered 2022-05-15: 10 mL via INTRAVENOUS
  Filled 2022-05-15: qty 10

## 2022-05-15 MED ORDER — SODIUM CHLORIDE 0.9 % IV SOLN
420.0000 mg | Freq: Once | INTRAVENOUS | Status: AC
  Administered 2022-05-15: 420 mg via INTRAVENOUS
  Filled 2022-05-15: qty 14

## 2022-05-15 MED ORDER — DIPHENHYDRAMINE HCL 25 MG PO CAPS
50.0000 mg | ORAL_CAPSULE | Freq: Once | ORAL | Status: AC
  Administered 2022-05-15: 50 mg via ORAL
  Filled 2022-05-15: qty 2

## 2022-05-15 MED ORDER — TRASTUZUMAB-ANNS CHEMO 150 MG IV SOLR
6.0000 mg/kg | Freq: Once | INTRAVENOUS | Status: AC
  Administered 2022-05-15: 600 mg via INTRAVENOUS
  Filled 2022-05-15: qty 28.57

## 2022-05-15 MED ORDER — SODIUM CHLORIDE 0.9 % IV SOLN
Freq: Once | INTRAVENOUS | Status: AC
  Filled 2022-05-15: qty 250

## 2022-05-15 NOTE — Patient Instructions (Signed)
[place] [facility]  Discharge Instructions: Thank you for choosing [facility] to provide your oncology and hematology care.  If you have a lab appointment with the Cancer Center, please go directly to the Cancer Center and check in at the registration area.  Wear comfortable clothing and clothing appropriate for easy access to any Portacath or PICC line.   We strive to give you quality time with your provider. You may need to reschedule your appointment if you arrive late (15 or more minutes).  Arriving late affects you and other patients whose appointments are after yours.  Also, if you miss three or more appointments without notifying the office, you may be dismissed from the clinic at the provider's discretion.      For prescription refill requests, have your pharmacy contact our office and allow 72 hours for refills to be completed.    Today you received the following chemotherapy and/or immunotherapy agents Kanjinti & Perjeta      To help prevent nausea and vomiting after your treatment, we encourage you to take your nausea medication as directed.  BELOW ARE SYMPTOMS THAT SHOULD BE REPORTED IMMEDIATELY: *FEVER GREATER THAN 100.4 F (38 C) OR HIGHER *CHILLS OR SWEATING *NAUSEA AND VOMITING THAT IS NOT CONTROLLED WITH YOUR NAUSEA MEDICATION *UNUSUAL SHORTNESS OF BREATH *UNUSUAL BRUISING OR BLEEDING *URINARY PROBLEMS (pain or burning when urinating, or frequent urination) *BOWEL PROBLEMS (unusual diarrhea, constipation, pain near the anus) TENDERNESS IN MOUTH AND THROAT WITH OR WITHOUT PRESENCE OF ULCERS (sore throat, sores in mouth, or a toothache) UNUSUAL RASH, SWELLING OR PAIN  UNUSUAL VAGINAL DISCHARGE OR ITCHING   Items with * indicate a potential emergency and should be followed up as soon as possible or go to the Emergency Department if any problems should occur.  Please show the CHEMOTHERAPY ALERT CARD or IMMUNOTHERAPY ALERT CARD at  check-in to the Emergency Department and triage nurse.  Should you have questions after your visit or need to cancel or reschedule your appointment, please contact [place] [facility]  [phone] and follow the prompts.  Office hours are 8:00 a.m. to 4:30 p.m. Monday - Friday. Please note that voicemails left after 4:00 p.m. may not be returned until the following business day.  We are closed weekends and major holidays. You have access to a nurse at all times for urgent questions. Please call the main number to the clinic [phone] and follow the prompts.  For any non-urgent questions, you may also contact your provider using MyChart. We now offer e-Visits for anyone 30 and older to request care online for non-urgent symptoms. For details visit [email or web address].   Also download the MyChart app! Go to the app store, search "MyChart", open the app, select , and log in with your MyChart username and password.

## 2022-05-15 NOTE — Progress Notes (Signed)
Pt here for follow up. No new concerns voiced.   

## 2022-05-15 NOTE — Assessment & Plan Note (Signed)
Hemoglobin is stable. Monitor counts. 

## 2022-05-15 NOTE — Assessment & Plan Note (Addendum)
IV magnesium PRN if Mag <1.7 Continue Slow Mag 1 tab daily.

## 2022-05-15 NOTE — Assessment & Plan Note (Signed)
continue potassium chloride 20meq twice daily.  

## 2022-05-15 NOTE — Progress Notes (Signed)
Nutrition Follow-up:  Patient with left breast cancer.  Patient receiving kanjinti and perjeta.    Met with patient during infusion.  Reports that appetite is doing good.  Has diarrhea about 1-2 times per week, not daily.  Has been eating baked fish, chicken, peanut butter, vegetables and fruits that do not upset her stomach.  Has been eating foods high in potassium.  Drinking fluids.     Medications: nexium, Magnesium, lomotil, taken off trulicity  Labs: Mag 1.4  Anthropometrics:   Weight 209 lb 11.2 oz today, stable  208 lb 4.8 oz on 12/17/21 216 lb on 10/08/21   NUTRITION DIAGNOSIS: Inadequate oral intake improved, weight stable    INTERVENTION:  Encouraged protein food at every meal Continue to avoid foods that will increase diarrhea Patient to contact RD if needed   NEXT VISIT: as needed   Winifred Bodiford B. Freida Busman, RD, LDN Registered Dietitian [phone]

## 2022-05-15 NOTE — Progress Notes (Signed)
Hematology/Oncology Progress note [phone] [phone]     CHIEF COMPLAINTS/REASON FOR VISIT:  Follow-up for Stage IIIB left breast cancer, ER-, HER2+  ASSESSMENT & PLAN:   Cancer Staging  Breast cancer Staging form: Breast, AJCC 8th Edition - Clinical stage from 08/21/2021: Stage IIIB (cT4b, cN3c, cM0, G3, ER-, PR-, HER2+) - Signed by [name], MD on 09/12/2021  Breast cancer (HCC) Left breast cancer, cT4b N3 Mx, ER/PR-, HER2 + S/p neoadjuvant chemotherapy 6 cycles of TCHP  S/p left mastectomy + SLNB -ypT0 ypN0, complete pathological response.  Recommend adjuvant Transtuzumab and Pertuzumab to complete 1 year treatment if she tolerates.  Labs reviewed and discussed with patient.  Proceed with trastuzumab and pertuzumab today.  Repeat echocardiogram- stable LVEF Follow up with radiation oncology for adjuvant radiation.   Anemia due to antineoplastic chemotherapy Hemoglobin is stable. Monitor counts.   Hypomagnesemia IV magnesium PRN if Mag <1.7 Continue Slow Mag 1 tab daily.   Hypokalemia continue potassium chloride twice daily.   Anxiety associated with cancer diagnosis (HCC) Recommend low dose Xananx 0.25mg  daily PRN    Orders Placed This Encounter  Procedures   Hepatic function panel    Standing Status:   Future    Number of Occurrences:   1    Standing Expiration Date:   05/15/2023   CBC (Cancer Center Only)    Standing Status:   Future    Standing Expiration Date:   06/05/2023   CMP (Cancer Center only)    Standing Status:   Future    Standing Expiration Date:   06/05/2023   Magnesium    Standing Status:   Future    Standing Expiration Date:   06/05/2023   CBC (Cancer Center Only)    Standing Status:   Future    Standing Expiration Date:   06/26/2023   CMP (Cancer Center only)    Standing Status:   Future    Standing Expiration Date:   06/26/2023   Magnesium    Standing Status:   Future    Standing Expiration Date:   06/26/2023      All questions were answered. The patient knows to call the clinic with any problems questions or concerns.  Return of visit:  3 weeks lab MD Transtuzumab + Pertuzumab +/- IV magnesium  [name], MD, PhD [facility] Hematology Oncology 05/15/2022      HISTORY OF PRESENTING ILLNESS:  Vanessa Fisher is a  62 y.o.  female with PMH listed below who was referred to me for evaluation of  left breast cancer SUMMARY OF ONCOLOGIC HISTORY: Oncology History  Breast cancer  07/31/2021 Imaging   Bilateral diagnostic mammogram and US showed 5 centimeter LEFT breast mass associated with pleomorphic calcifications is suspicious for invasive ductal carcinoma.At least 4 LEFT axillary lymph nodes with abnormal morphology   08/21/2021 Cancer Staging   Staging form: Breast, AJCC 8th Edition - Clinical: Stage IIIB (cT4b, cN3c, cM0, G3, ER-, PR-, HER2+) - Signed by [name], MD on 08/28/2021 Histologic grading system: 3 grade system  -08/21/21 left breast ultrasound-guided biopsy showed invasive mammary carcinoma, grade 3, ER/PR negative, HER2 positive.  Left axillary lymph node biopsy positive for macro metastatic mammary carcinoma, 8 mm in greatest extent.   08/30/2021 Imaging   MRI brain w wo contrast -No metastatic disease or acute intracranial abnormality. Essentially normal for age MRI appearance of the brain.    09/03/2021 Echocardiogram   1. Left ventricular ejection fraction, by estimation, is 55  to 60%. Left ventricular ejection fraction by 3D volume is 58 %. The left ventricle has normal function. The left ventricle has no regional wall motion abnormalities. There is mild left  ventricular hypertrophy. Left ventricular diastolic parameters were normal.  2. Right ventricular systolic function is normal. The right ventricular size is normal.  3. The mitral valve is normal in structure. No evidence of mitral valve regurgitation.  4. The aortic valve was not well visualized. Aortic valve  regurgitation is not visualized   09/10/2021 Imaging   PET scan showed Large hypermetabolic left breast mass and diffuse skin thickening,consistent with primary breast carcinoma. Hypermetabolic lymphadenopathy in the left axilla, left subpectoral region, and left supraclavicular region, consistent with metastatic disease. No evidence of metastatic disease within the abdomen or pelvis   09/12/2021 - 02/13/2022 Chemotherapy   Patient is on Treatment Plan : BREAST  Docetaxel + Carboplatin + Trastuzumab + Pertuzumab  (TCHP) q21d       Genetic Testing   Negative genetic testing. No pathogenic variants identified on the [facility] Common Hereditary Cancers+RNA panel. The report date is 10/26/2021.  The Common Hereditary Cancers Panel + RNA offered by [facility] includes sequencing and/or deletion duplication testing of the following 47 genes: APC, ATM, AXIN2, BARD1, BMPR1A, BRCA1, BRCA2, BRIP1, CDH1, CDKN2A (p14ARF), CDKN2A (p16INK4a), CKD4, CHEK2, CTNNA1, DICER1, EPCAM (Deletion/duplication testing only), GREM1 (promoter region deletion/duplication testing only), KIT, MEN1, MLH1, MSH2, MSH3, MSH6, MUTYH, NBN, NF1, NHTL1, PALB2, PDGFRA, PMS2, POLD1, POLE, PTEN, RAD50, RAD51C, RAD51D, SDHB, SDHC, SDHD, SMAD4, SMARCA4. STK11, TP53, TSC1, TSC2, and VHL.  The following genes were evaluated for sequence changes only: SDHA and HOXB13 c.251G>A variant only.   02/09/2022 Echocardiogram   LVEF 55 to 60%    03/27/2022 Surgery   S/p left mastectomy + SLNB ypT0 ypN0,    04/24/2022 -  Chemotherapy   Patient is on Treatment Plan : BREAST Trastuzumab  + Pertuzumab q21d x 13 cycles     05/06/2022 Echocardiogram   LVEF 55 to 60%      INTERVAL HISTORY Vanessa Fisher is a 62 y.o. female who has above history reviewed by me today presents for follow up visit for Triple positive breast cancer treatment.  Patient follows up with surgery for seroma aspiration. +diarrhea for 1 day after last chemotherapy, manageable  symptoms after taking anti diarrhea medication.    MEDICAL HISTORY:  Past Medical History:  Diagnosis Date   Anemia    Anxiety    Arthritis    Asthma    WELL CONTROLLED   Bile acid malabsorption syndrome    Bradycardia    HAD AN ISSUE WITH THIS IN 2016-NO PROBLEMS SINCE   Breast cancer, left breast 08/2021   Carpal tunnel syndrome on right    Depression    Diabetes mellitus without complication    Diverticulitis    Gastric reflux    GERD (gastroesophageal reflux disease)    Hand pain 05/12/2016   Headache    H/O MIGRAINES   History of kidney stones    H/O   Lumbar radiculitis    Neck pain, chronic    Osteoarthritis of both knees    Panic attacks     SURGICAL HISTORY: Past Surgical History:  Procedure Laterality Date   BREAST BIOPSY Left 08/21/2021   Axilla Bx, Hydromarker, neg   BREAST BIOPSY Left 08/21/2021   [place] Bx, Ribbon Clip, invasive mammary carcinoma   BREAST BIOPSY Left 03/12/2022   [place] LT RADIO FREQUENCY TAG LOC US GUIDE 03/12/2022  [facility]   BREAST BIOPSY Left 03/12/2022   [place] LT RADIO FREQUENCY TAG EA ADD LESION LOC US GUIDE 03/12/2022 [facility]   CARPAL TUNNEL RELEASE Right 12/06/2017   Procedure: CARPAL TUNNEL RELEASE;  Surgeon: Deeann Saint, MD;  Location: [facility] ORS;  Service: Orthopedics;  Laterality: Right;   COLONOSCOPY WITH PROPOFOL N/A 06/11/2021   Procedure: COLONOSCOPY WITH PROPOFOL;  Surgeon: Toney Reil, MD;  Location: [facility]COPY;  Service: Gastroenterology;  Laterality: N/A;   DORSAL COMPARTMENT RELEASE Right 12/06/2017   Procedure: RELEASE DORSAL COMPARTMENT (DEQUERVAIN);  Surgeon: Deeann Saint, MD;  Location: [facility] ORS;  Service: Orthopedics;  Laterality: Right;   ESOPHAGOGASTRODUODENOSCOPY (EGD) WITH PROPOFOL N/A 06/11/2021   Procedure: ESOPHAGOGASTRODUODENOSCOPY (EGD) WITH PROPOFOL;  Surgeon: Toney Reil, MD;  Location: [facility] ENDOSCOPY;  Service: Gastroenterology;  Laterality: N/A;   PART MASTECTOMY,RADIO  FREQUENCY LOCALIZER,AXILLARY SENTINEL NODE BIOPSY Left 03/27/2022   Procedure: PART MASTECTOMY,RADIO FREQUENCY LOCALIZER,AXILLARY SENTINEL NODE BIOPSY;  Surgeon: Carolan Shiver, MD;  Location: [facility] ORS;  Service: General;  Laterality: Left;   PARTIAL HYSTERECTOMY     PORTACATH PLACEMENT N/A 09/24/2021   Procedure: INSERTION PORT-A-CATH;  Surgeon: Carolan Shiver, MD;  Location: [facility] ORS;  Service: General;  Laterality: N/A;   TUBAL LIGATION      SOCIAL HISTORY: Social History   Socioeconomic History   Marital status: Single    Spouse name: Not on file   Number of children: Not on file   Years of education: Not on file   Highest education level: Not on file  Occupational History   Not on file  Tobacco Use   Smoking status: Former    Types: Cigarettes   Smokeless tobacco: Never  Vaping Use   Vaping Use: Never used  Substance and Sexual Activity   Alcohol use: No   Drug use: No   Sexual activity: Not on file  Other Topics Concern   Not on file  Social History Narrative   Lives alone   Social Determinants of Health   Financial Resource Strain: High Risk (08/15/2021)   Overall Financial Resource Strain (CARDIA)    Difficulty of Paying Living Expenses: Very hard  Food Insecurity: Food Insecurity Present (09/09/2021)   Hunger Vital Sign    Worried About Running Out of Food in the Last Year: Often true    Ran Out of Food in the Last Year: Often true  Transportation Needs: No Transportation Needs (08/15/2021)   PRAPARE - Administrator, Civil Service (Medical): No    Lack of Transportation (Non-Medical): No  Physical Activity: Inactive (08/15/2021)   Exercise Vital Sign    Days of Exercise per Week: 0 days    Minutes of Exercise per Session: 0 min  Stress: Stress Concern Present (08/15/2021)   [employer] of Occupational Health - Occupational Stress Questionnaire    Feeling of Stress : Rather much  Social Connections: Socially Isolated (08/15/2021)    Social Connection and Isolation Panel [NHANES]    Frequency of Communication with Friends and Family: Once a week    Frequency of Social Gatherings with Friends and Family: Once a week    Attends Religious Services: Never    Database administrator or Organizations: No    Attends Engineer, structural: Not on file    Marital Status: Never married  Intimate Partner Violence: Not At Risk (08/15/2021)   Humiliation, Afraid, Rape, and Kick questionnaire    Fear of Current or Ex-Partner: No    Emotionally Abused: No  Physically Abused: No    Sexually Abused: No    FAMILY HISTORY: Family History  Problem Relation Age of Onset   Diabetes Mother    Hypertension Mother    Heart failure Mother    Pancreatic cancer Mother 64   Diabetes Father    Hypertension Father    Breast cancer Maternal Aunt    Cervical cancer Maternal Aunt    Lung cancer Son 64    ALLERGIES:  is allergic to bc powder [aspirin-salicylamide-caffeine], doxycycline, gabapentin, hydrocodone-acetaminophen, latex, penicillin g, penicillins, and shellfish allergy.  MEDICATIONS:  Current Outpatient Medications  Medication Sig Dispense Refill   albuterol (PROVENTIL) (2.5 MG/3ML) 0.083% nebulizer solution Take 3 mLs (2.5 mg total) by nebulization every 4 (four) hours as needed for wheezing or shortness of breath. 75 mL 2   albuterol (VENTOLIN HFA) 108 (90 Base) MCG/ACT inhaler Inhale 2 puffs into the lungs every 6 (six) hours as needed for wheezing or shortness of breath. 18 g 0   Azelastine HCl 137 MCG/SPRAY SOLN Place 1 spray into the nose daily. 30 mL 0   cetirizine (ZYRTEC ALLERGY) 10 MG tablet Take 1 tablet (10 mg total) by mouth daily. 90 tablet 0   diphenoxylate-atropine (LOMOTIL) 2.5-0.025 MG tablet Take 1 tablet by mouth 4 (four) times daily as needed for diarrhea or loose stools. 120 tablet 0   esomeprazole (NEXIUM) 40 MG capsule Take 1 capsule (40 mg total) by mouth daily at 12 noon. 30 capsule 3    FLOVENT DISKUS 250 MCG/ACT AEPB Inhale 1 puff into the lungs daily.     fluticasone (FLONASE) 50 MCG/ACT nasal spray Place 2 sprays into both nostrils daily. 15.8 mL 0   Iron-Vitamin C 65-125 MG TABS Take 1 tablet by mouth daily at 2 PM. 30 tablet 2   lidocaine-prilocaine (EMLA) cream Apply 1 Application topically as needed. 30 g 12   magic mouthwash (multi-ingredient) oral suspension Swish and spit 5-10 ml by mouth 4 times a day as needed 400 mL 1   montelukast (SINGULAIR) 10 MG tablet Take by mouth.     ondansetron (ZOFRAN) 8 MG tablet Take 1 tablet (8 mg total) by mouth 2 (two) times daily as needed (Nausea or vomiting). Start on the third day after chemotherapy. 30 tablet 1   oxyCODONE-acetaminophen (PERCOCET/ROXICET) 5-325 MG tablet Take 1 tablet by mouth every 6 (six) hours as needed for pain.     potassium chloride SA (KLOR-CON M20) 20 MEQ tablet TAKE 1 TABLET BY MOUTH TWICE A DAY 60 tablet 1   prochlorperazine (COMPAZINE) 10 MG tablet Take 1 tablet (10 mg total) by mouth every 6 (six) hours as needed (Nausea or vomiting). 30 tablet 1   tamsulosin (FLOMAX) 0.4 MG CAPS capsule Take 0.4 mg by mouth daily.     traZODone (DESYREL) 50 MG tablet Take 1 tablet (50 mg total) by mouth at bedtime as needed for sleep. 90 tablet 1   zinc gluconate 50 MG tablet Take 50 mg by mouth as needed.     magnesium chloride (SLOW-MAG) 64 MG TBEC SR tablet Take 1 tablet (64 mg total) by mouth daily. 30 tablet 3   naloxone (NARCAN) nasal spray 4 mg/0.1 mL  (Patient not taking: Reported on 12/17/2021)     No current facility-administered medications for this visit.   Facility-Administered Medications Ordered in Other Visits  Medication Dose Route Frequency Provider Last Rate Last Admin   heparin lock flush 100 unit/mL  500 Units Intracatheter Once PRN [name], MD  magnesium sulfate IVPB 2 g 50 mL  2 g Intravenous Once C[place]ton, Sarah M, PA-C       magnesium sulfate IVPB 4 g 100 mL  4 g Intravenous Once [name], MD 50 mL/hr at 05/15/22 0939 4 g at 05/15/22 0939   pertuzumab (PERJETA) 420 mg in sodium chloride 0.9 % 250 mL chemo infusion  420 mg Intravenous Once [name], MD       trastuzumab-anns [facility]) 600 mg in sodium chloride 0.9 % 250 mL chemo infusion  6 mg/kg (Treatment Plan Recorded) Intravenous Once [name], MD        Review of Systems  Constitutional:  Negative for appetite change, chills, fatigue and fever.  HENT:   Negative for hearing loss and voice change.        Nasal congestion, pressure  Eyes:  Negative for eye problems.  Respiratory:  Negative for chest tightness and cough.   Cardiovascular:  Negative for chest pain.  Gastrointestinal:  Positive for diarrhea. Negative for abdominal distention, abdominal pain and blood in stool.  Endocrine: Negative for hot flashes.  Genitourinary:  Negative for difficulty urinating and frequency.   Musculoskeletal:  Positive for arthralgias and back pain.  Skin:  Negative for itching and rash.  Neurological:  Positive for headaches. Negative for extremity weakness.  Hematological:  Negative for adenopathy.  Psychiatric/Behavioral:  Negative for confusion.      PHYSICAL EXAMINATION: ECOG PERFORMANCE STATUS: 1 - Symptomatic but completely ambulatory Vitals:   05/15/22 0854  BP: (!) 110/55  Pulse: (!) 55  Resp: 18  Temp: (!) 96.9 F (36.1 C)   Filed Weights   05/15/22 0854  Weight: 209 lb 11.2 oz (95.1 kg)    Physical Exam Constitutional:      General: She is not in acute distress.    Appearance: She is not diaphoretic.  HENT:     Head: Normocephalic and atraumatic.     Nose: Nose normal.     Mouth/Throat:     Pharynx: No oropharyngeal exudate.  Eyes:     General: No scleral icterus.    Pupils: Pupils are equal, round, and reactive to light.  Cardiovascular:     Rate and Rhythm: Normal rate and regular rhythm.     Heart sounds: No murmur heard. Pulmonary:     Effort: Pulmonary effort is normal. No respiratory  distress.     Breath sounds: No rales.  Chest:     Chest wall: No tenderness.  Abdominal:     General: There is no distension.     Palpations: Abdomen is soft.     Tenderness: There is no abdominal tenderness.  Musculoskeletal:        General: Normal range of motion.     Cervical back: Normal range of motion and neck supple.  Skin:    General: Skin is warm and dry.     Findings: No erythema.  Neurological:     Mental Status: She is alert and oriented to person, place, and time.     Cranial Nerves: No cranial nerve deficit.     Motor: No abnormal muscle tone.     Coordination: Coordination normal.  Psychiatric:        Mood and Affect: Affect normal.     LABORATORY DATA:  I have reviewed the data as listed    Latest Ref Rng & Units 05/15/2022    8:46 AM 05/05/2022    9:35 AM 04/24/2022    8:38 AM  CBC  WBC 4.0 - 10.5 K/uL 3.9  4.2  8.5   Hemoglobin 12.0 - 15.0 g/dL 9.3  9.0  9.4   Hematocrit 36.0 - 46.0 % 30.0  28.6  29.3   Platelets 150 - 400 K/uL 224  284  241       Latest Ref Rng & Units 05/15/2022    8:31 AM 05/05/2022    9:35 AM 04/24/2022    8:38 AM  CMP  Glucose 70 - 99 mg/dL 829  562  130   BUN 6 - 20 mg/dL 18  23  14    Creatinine 0.44 - 1.00 mg/dL 8.65  7.84  6.96   Sodium 135 - 145 mmol/L 137  136  134   Potassium 3.5 - 5.1 mmol/L 4.2  4.1  3.8   Chloride 98 - 111 mmol/L 106  105  102   CO2 22 - 32 mmol/L 25  24  24    Calcium 8.9 - 10.3 mg/dL 9.2  8.7  8.8   Total Protein 6.5 - 8.1 g/dL 7.3   7.4   Total Bilirubin 0.3 - 1.2 mg/dL 0.2   0.4   Alkaline Phos 38 - 126 U/L 85   97   AST 15 - 41 U/L 18   13   ALT 0 - 44 U/L 11   12       RADIOGRAPHIC STUDIES: I have personally reviewed the radiological images as listed and agreed with the findings in the report. ECHOCARDIOGRAM COMPLETE  Result Date: 05/06/2022    ECHOCARDIOGRAM REPORT   Patient Name:   Vanessa Fisher Date of Exam: 05/06/2022 Medical Rec #:  [id number]            Height:       66.0 in  Accession #:    [id number]           Weight:       208.0 lb Date of Birth:  [birth year]            BSA:          2.033 m Patient Age:    62 years             BP:           117/71 mmHg Patient Gender: F                    HR:           64 bpm. Exam Location:  [facility] Procedure: 2D Echo, Cardiac Doppler, Color Doppler and Strain Analysis Indications:     Chemo  History:         Patient has prior history of Echocardiogram examinations, most                  recent 02/09/2022. Risk Factors:Diabetes. Breast CA.  Sonographer:     Mikki Harbor Referring Phys:  [id number] Sharissa Brierley Diagnosing Phys: Debbe Odea MD  Sonographer Comments: Global longitudinal strain was attempted. IMPRESSIONS  1. Left ventricular ejection fraction, by estimation, is 55 to 60%. The left ventricle has normal function. The left ventricle has no regional wall motion abnormalities. Left ventricular diastolic parameters were normal. The average left ventricular global longitudinal strain is -23.5 %. The global longitudinal strain is normal.  2. Right ventricular systolic function is normal. The right ventricular size is normal. There is normal pulmonary artery systolic pressure.  3. Left atrial size was mildly dilated.  4. The mitral valve is normal in  structure. Trivial mitral valve regurgitation.  5. The aortic valve is tricuspid. Aortic valve regurgitation is not visualized.  6. The inferior vena cava is normal in size with greater than 50% respiratory variability, suggesting right atrial pressure of 3 mmHg. FINDINGS  Left Ventricle: Left ventricular ejection fraction, by estimation, is 55 to 60%. The left ventricle has normal function. The left ventricle has no regional wall motion abnormalities. The average left ventricular global longitudinal strain is -23.5 %. The global longitudinal strain is normal. The left ventricular internal cavity size was normal in size. There is no left ventricular hypertrophy. Left ventricular diastolic parameters  were normal. Right Ventricle: The right ventricular size is normal. No increase in right ventricular wall thickness. Right ventricular systolic function is normal. There is normal pulmonary artery systolic pressure. The tricuspid regurgitant velocity is 2.39 m/s, and  with an assumed right atrial pressure of 3 mmHg, the estimated right ventricular systolic pressure is 25.8 mmHg. Left Atrium: Left atrial size was mildly dilated. Right Atrium: Right atrial size was normal in size. Pericardium: There is no evidence of pericardial effusion. Mitral Valve: The mitral valve is normal in structure. Trivial mitral valve regurgitation. MV peak gradient, 4.7 mmHg. The mean mitral valve gradient is 1.0 mmHg. Tricuspid Valve: The tricuspid valve is normal in structure. Tricuspid valve regurgitation is mild. Aortic Valve: The aortic valve is tricuspid. Aortic valve regurgitation is not visualized. Aortic valve mean gradient measures 5.0 mmHg. Aortic valve peak gradient measures 8.2 mmHg. Aortic valve area, by VTI measures 2.90 cm. Pulmonic Valve: The pulmonic valve was normal in structure. Pulmonic valve regurgitation is trivial. Aorta: The aortic root and ascending aorta are structurally normal, with no evidence of dilitation. Venous: The inferior vena cava is normal in size with greater than 50% respiratory variability, suggesting right atrial pressure of 3 mmHg. IAS/Shunts: No atrial level shunt detected by color flow Doppler.  LEFT VENTRICLE PLAX 2D LVIDd:         4.40 cm   Diastology LVIDs:         3.10 cm   LV e' medial:    10.20 cm/s LV PW:         1.10 cm   LV E/e' medial:  9.5 LV IVS:        0.90 cm   LV e' lateral:   13.20 cm/s LVOT diam:     2.00 cm   LV E/e' lateral: 7.3 LV SV:         108 LV SV Index:   53        2D Longitudinal Strain LVOT Area:     3.14 cm  2D Strain GLS Avg:     -23.5 %  RIGHT VENTRICLE RV Basal diam:  4.20 cm RV Mid diam:    4.10 cm RV S prime:     16.20 cm/s TAPSE (M-mode): 2.6 cm LEFT ATRIUM              Index        RIGHT ATRIUM           Index LA diam:        3.90 cm 1.92 cm/m   RA Area:     17.00 cm LA Vol (A2C):   99.3 ml 48.83 ml/m  RA Volume:   48.90 ml  24.05 ml/m LA Vol (A4C):   66.8 ml 32.85 ml/m LA Biplane Vol: 83.5 ml 41.06 ml/m  AORTIC VALVE  PULMONIC VALVE AV Area (Vmax):    3.27 cm      PV Vmax:       1.25 m/s AV Area (Vmean):   2.92 cm      PV Peak grad:  6.2 mmHg AV Area (VTI):     2.90 cm AV Vmax:           143.00 cm/s AV Vmean:          102.000 cm/s AV VTI:            0.372 m AV Peak Grad:      8.2 mmHg AV Mean Grad:      5.0 mmHg LVOT Vmax:         149.00 cm/s LVOT Vmean:        94.900 cm/s LVOT VTI:          0.343 m LVOT/AV VTI ratio: 0.92  AORTA Ao Root diam: 2.90 cm MITRAL VALVE               TRICUSPID VALVE MV Area (PHT): 3.79 cm    TR Peak grad:   22.8 mmHg MV Area VTI:   3.34 cm    TR Vmax:        239.00 cm/s MV Peak grad:  4.7 mmHg MV Mean grad:  1.0 mmHg    SHUNTS MV Vmax:       1.08 m/s    Systemic VTI:  0.34 m MV Vmean:      49.6 cm/s   Systemic Diam: 2.00 cm MV Decel Time: 200 msec MV E velocity: 96.70 cm/s MV A velocity: 81.20 cm/s MV E/A ratio:  1.19 Debbe Odea MD Electronically signed by Debbe Odea MD Signature Date/Time: 05/06/2022/12:39:32 PM    Final    MR Brain W Wo Contrast  Result Date: 04/17/2022 CLINICAL DATA:  Provided history: 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 Malignant neoplasm of overlapping sites of left breast in female, estrogen receptor negative. Breast cancer, headache. EXAM: MRI HEAD WITHOUT AND WITH CONTRAST TECHNIQUE: Multiplanar, multiecho pulse sequences of the brain and surrounding structures were obtained without and with intravenous contrast. CONTRAST:  55mL GADAVIST GADOBUTROL 1 MMOL/ML IV SOLN COMPARISON:  Brain MRI 08/30/2021. FINDINGS: Brain: Cerebral volume is normal. Unchanged nonspecific 4 mm T2 FLAIR hyperintense remote insult within the  left centrum semiovale. Partially empty sella turcica. No cortical encephalomalacia is identified. There is no acute infarct. No evidence of an intracranial mass. No chronic intracranial blood products. No extra-axial fluid collection. No midline shift. No pathologic intracranial enhancement identified. Vascular: Maintained flow voids within the proximal large arterial vessels. Skull and upper cervical spine: No focal suspicious marrow lesion. Sinuses/Orbits: No mass or acute finding within the imaged orbits. Trace mucosal thickening scattered within bilateral ethmoid air cells, and within the right maxillary sinus. IMPRESSION: Stable MRI appearance of the brain as compared to 08/30/2021. No evidence of intracranial metastatic disease. Partially empty sella turcica. This finding can reflect incidental anatomic variation, or alternatively, it can be associated with idiopathic intracranial hypertension (pseudotumor cerebri). Electronically Signed   By: Jackey Loge D.O.   On: 04/17/2022 09:41

## 2022-05-15 NOTE — Assessment & Plan Note (Addendum)
Left breast cancer, cT4b N3 Mx, ER/PR-, HER2 + S/p neoadjuvant chemotherapy 6 cycles of TCHP  S/p left mastectomy + SLNB -ypT0 ypN0, complete pathological response.  Recommend adjuvant Transtuzumab and Pertuzumab to complete 1 year treatment if she tolerates.  Labs reviewed and discussed with patient.  Proceed with trastuzumab and pertuzumab today.  Repeat echocardiogram- stable LVEF Follow up with radiation oncology for adjuvant radiation.

## 2022-05-15 NOTE — Assessment & Plan Note (Signed)
Recommend low dose Xananx 0.25mg daily PRN 

## 2022-05-19 ENCOUNTER — Ambulatory Visit
Admission: RE | Admit: 2022-05-19 | Discharge: 2022-05-19 | Disposition: A | Discharge status: Home / Self Care | Payer: Medicare HMO | Source: Ambulatory Visit | Attending: Radiation Oncology | Admitting: Radiation Oncology

## 2022-05-19 ENCOUNTER — Encounter: Payer: Self-pay | Admitting: *Deleted

## 2022-05-19 DIAGNOSIS — C50012 Malignant neoplasm of nipple and areola, left female breast: Secondary | ICD-10-CM | POA: Diagnosis present

## 2022-05-19 DIAGNOSIS — Z510 Encounter for antineoplastic radiation therapy: Secondary | ICD-10-CM | POA: Diagnosis not present

## 2022-05-20 ENCOUNTER — Other Ambulatory Visit: Payer: Self-pay | Admitting: Oncology

## 2022-05-20 DIAGNOSIS — E876 Hypokalemia: Secondary | ICD-10-CM

## 2022-05-20 NOTE — Telephone Encounter (Signed)
Component Ref Range & Units 5 d ago (05/15/22) 2 wk ago (05/05/22) 3 wk ago (04/24/22) 1 mo ago (04/10/22) 2 mo ago (02/27/22) 2 mo ago (02/23/22) 2 mo ago (02/20/22)  Potassium 3.5 - 5.1 mmol/L 4.2 4.1 3.8 4.0 3.8 3.5 3.6

## 2022-05-21 ENCOUNTER — Ambulatory Visit: Payer: Medicare HMO

## 2022-05-22 ENCOUNTER — Other Ambulatory Visit: Payer: Self-pay | Admitting: *Deleted

## 2022-05-22 DIAGNOSIS — C50012 Malignant neoplasm of nipple and areola, left female breast: Secondary | ICD-10-CM

## 2022-05-25 ENCOUNTER — Ambulatory Visit: Payer: Medicare HMO

## 2022-05-25 DIAGNOSIS — Z510 Encounter for antineoplastic radiation therapy: Secondary | ICD-10-CM | POA: Diagnosis not present

## 2022-05-26 ENCOUNTER — Ambulatory Visit: Payer: Medicare HMO

## 2022-05-26 ENCOUNTER — Ambulatory Visit
Admission: RE | Admit: 2022-05-26 | Discharge: 2022-05-26 | Disposition: A | Discharge status: Home / Self Care | Payer: Medicare HMO | Source: Ambulatory Visit | Attending: Radiation Oncology | Admitting: Radiation Oncology

## 2022-05-26 DIAGNOSIS — Z510 Encounter for antineoplastic radiation therapy: Secondary | ICD-10-CM | POA: Diagnosis not present

## 2022-05-27 ENCOUNTER — Ambulatory Visit: Payer: Medicare HMO

## 2022-05-28 ENCOUNTER — Ambulatory Visit: Payer: Medicare HMO

## 2022-05-29 ENCOUNTER — Ambulatory Visit: Payer: Medicare HMO

## 2022-06-01 ENCOUNTER — Ambulatory Visit: Payer: Medicare HMO

## 2022-06-02 ENCOUNTER — Ambulatory Visit: Payer: Medicare HMO

## 2022-06-03 ENCOUNTER — Inpatient Hospital Stay: Payer: Medicare HMO

## 2022-06-03 ENCOUNTER — Ambulatory Visit: Payer: Medicare HMO

## 2022-06-04 ENCOUNTER — Ambulatory Visit: Payer: Medicare HMO

## 2022-06-04 ENCOUNTER — Other Ambulatory Visit: Payer: Self-pay | Admitting: Oncology

## 2022-06-05 ENCOUNTER — Encounter: Payer: Self-pay | Admitting: Oncology

## 2022-06-05 ENCOUNTER — Ambulatory Visit: Payer: Medicare HMO

## 2022-06-05 ENCOUNTER — Inpatient Hospital Stay (HOSPITAL_BASED_OUTPATIENT_CLINIC_OR_DEPARTMENT_OTHER): Payer: Medicare HMO | Admitting: Oncology

## 2022-06-05 ENCOUNTER — Inpatient Hospital Stay: Payer: Medicare HMO | Attending: Oncology

## 2022-06-05 ENCOUNTER — Other Ambulatory Visit: Payer: Self-pay

## 2022-06-05 ENCOUNTER — Inpatient Hospital Stay: Payer: Medicare HMO

## 2022-06-05 VITALS — BP 110/59 | HR 56 | Temp 97.0°F | Resp 17 | Wt 211.0 lb

## 2022-06-05 DIAGNOSIS — J3489 Other specified disorders of nose and nasal sinuses: Secondary | ICD-10-CM | POA: Insufficient documentation

## 2022-06-05 DIAGNOSIS — R1011 Right upper quadrant pain: Secondary | ICD-10-CM | POA: Diagnosis not present

## 2022-06-05 DIAGNOSIS — M549 Dorsalgia, unspecified: Secondary | ICD-10-CM | POA: Diagnosis not present

## 2022-06-05 DIAGNOSIS — Z171 Estrogen receptor negative status [ER-]: Secondary | ICD-10-CM | POA: Insufficient documentation

## 2022-06-05 DIAGNOSIS — C50812 Malignant neoplasm of overlapping sites of left female breast: Secondary | ICD-10-CM | POA: Diagnosis not present

## 2022-06-05 DIAGNOSIS — F419 Anxiety disorder, unspecified: Secondary | ICD-10-CM | POA: Insufficient documentation

## 2022-06-05 DIAGNOSIS — M25511 Pain in right shoulder: Secondary | ICD-10-CM | POA: Insufficient documentation

## 2022-06-05 DIAGNOSIS — Z510 Encounter for antineoplastic radiation therapy: Secondary | ICD-10-CM | POA: Insufficient documentation

## 2022-06-05 DIAGNOSIS — F411 Generalized anxiety disorder: Secondary | ICD-10-CM

## 2022-06-05 DIAGNOSIS — Z452 Encounter for adjustment and management of vascular access device: Secondary | ICD-10-CM | POA: Insufficient documentation

## 2022-06-05 DIAGNOSIS — E876 Hypokalemia: Secondary | ICD-10-CM | POA: Diagnosis not present

## 2022-06-05 DIAGNOSIS — C50911 Malignant neoplasm of unspecified site of right female breast: Secondary | ICD-10-CM | POA: Diagnosis not present

## 2022-06-05 DIAGNOSIS — Z5112 Encounter for antineoplastic immunotherapy: Secondary | ICD-10-CM | POA: Diagnosis present

## 2022-06-05 DIAGNOSIS — C50012 Malignant neoplasm of nipple and areola, left female breast: Secondary | ICD-10-CM | POA: Diagnosis present

## 2022-06-05 DIAGNOSIS — T451X5A Adverse effect of antineoplastic and immunosuppressive drugs, initial encounter: Secondary | ICD-10-CM

## 2022-06-05 DIAGNOSIS — C50912 Malignant neoplasm of unspecified site of left female breast: Secondary | ICD-10-CM | POA: Insufficient documentation

## 2022-06-05 DIAGNOSIS — D6481 Anemia due to antineoplastic chemotherapy: Secondary | ICD-10-CM | POA: Insufficient documentation

## 2022-06-05 DIAGNOSIS — R300 Dysuria: Secondary | ICD-10-CM | POA: Insufficient documentation

## 2022-06-05 DIAGNOSIS — C801 Malignant (primary) neoplasm, unspecified: Secondary | ICD-10-CM

## 2022-06-05 LAB — CMP (CANCER CENTER ONLY)
ALT: 13 U/L (ref 0–44)
AST: 20 U/L (ref 15–41)
Albumin: 3.5 g/dL (ref 3.5–5.0)
Alkaline Phosphatase: 85 U/L (ref 38–126)
Anion gap: 8 (ref 5–15)
BUN: 13 mg/dL (ref 6–20)
CO2: 26 mmol/L (ref 22–32)
Calcium: 8.5 mg/dL — ABNORMAL LOW (ref 8.9–10.3)
Chloride: 105 mmol/L (ref 98–111)
Creatinine: 0.9 mg/dL (ref 0.44–1.00)
GFR, Estimated: >60 mL/min (ref >60)
Glucose, Bld: 117 mg/dL — ABNORMAL HIGH (ref 70–99)
Potassium: 3.7 mmol/L (ref 3.5–5.1)
Sodium: 139 mmol/L (ref 135–145)
Total Bilirubin: 0.5 mg/dL (ref 0.3–1.2)
Total Protein: 6.9 g/dL (ref 6.5–8.1)

## 2022-06-05 LAB — CBC (CANCER CENTER ONLY)
HCT: 29.5 % — ABNORMAL LOW (ref 36.0–46.0)
Hemoglobin: 9.2 g/dL — ABNORMAL LOW (ref 12.0–15.0)
MCH: 26.7 pg (ref 26.0–34.0)
MCHC: 31.2 g/dL (ref 30.0–36.0)
MCV: 85.8 fL (ref 80.0–100.0)
Platelet Count: 209 10*3/uL (ref 150–400)
RBC: 3.44 MIL/uL — ABNORMAL LOW (ref 3.87–5.11)
RDW: 15 % (ref 11.5–15.5)
WBC Count: 4.4 10*3/uL (ref 4.0–10.5)
nRBC: 0 % (ref 0.0–0.2)

## 2022-06-05 LAB — MAGNESIUM: Magnesium: 1.5 mg/dL — ABNORMAL LOW (ref 1.7–2.4)

## 2022-06-05 MED ORDER — DIPHENOXYLATE-ATROPINE 2.5-0.025 MG PO TABS: 1.0000 | ORAL_TABLET | Freq: Four times a day (QID) | ORAL | 0 refills | Status: DC | PRN

## 2022-06-05 MED ORDER — ACETAMINOPHEN 325 MG PO TABS
650.0000 mg | ORAL_TABLET | Freq: Once | ORAL | Status: AC
  Administered 2022-06-05: 650 mg via ORAL
  Filled 2022-06-05: qty 2

## 2022-06-05 MED ORDER — CALCIUM CARBONATE 1250 (500 CA) MG PO TABS: 1.0000 | ORAL_TABLET | Freq: Every day | ORAL | 1 refills | Status: AC

## 2022-06-05 MED ORDER — DIPHENHYDRAMINE HCL 25 MG PO CAPS
50.0000 mg | ORAL_CAPSULE | Freq: Once | ORAL | Status: AC
  Administered 2022-06-05: 50 mg via ORAL
  Filled 2022-06-05: qty 2

## 2022-06-05 MED ORDER — TRASTUZUMAB-ANNS CHEMO 150 MG IV SOLR
6.0000 mg/kg | Freq: Once | INTRAVENOUS | Status: AC
  Administered 2022-06-05: 600 mg via INTRAVENOUS
  Filled 2022-06-05: qty 28.57

## 2022-06-05 MED ORDER — PROCHLORPERAZINE MALEATE 10 MG PO TABS
10.0000 mg | ORAL_TABLET | Freq: Four times a day (QID) | ORAL | 1 refills | Status: DC | PRN
Start: 2022-06-05 — End: 2023-04-20

## 2022-06-05 MED ORDER — SODIUM CHLORIDE 0.9 % IV SOLN
420.0000 mg | Freq: Once | INTRAVENOUS | Status: AC
  Administered 2022-06-05: 420 mg via INTRAVENOUS
  Filled 2022-06-05: qty 14

## 2022-06-05 MED ORDER — ONDANSETRON HCL 8 MG PO TABS
8.0000 mg | ORAL_TABLET | Freq: Two times a day (BID) | ORAL | 1 refills | Status: DC | PRN
Start: 2022-06-05 — End: 2023-04-20

## 2022-06-05 MED ORDER — SODIUM CHLORIDE 0.9 % IV SOLN
Freq: Once | INTRAVENOUS | Status: AC
  Filled 2022-06-05: qty 250

## 2022-06-05 MED ORDER — MAGNESIUM SULFATE 2 GM/50ML IV SOLN
2.0000 g | Freq: Once | INTRAVENOUS | Status: AC
  Administered 2022-06-05: 2 g via INTRAVENOUS
  Filled 2022-06-05: qty 50

## 2022-06-05 NOTE — Patient Instructions (Signed)
[facility] [place] [facility]  Discharge Instructions: Thank you for choosing [facility] to provide your oncology and hematology care.  If you have a lab appointment with the Cancer Center, please go directly to the Cancer Center and check in at the registration area.  Wear comfortable clothing and clothing appropriate for easy access to any Portacath or PICC line.   We strive to give you quality time with your provider. You may need to reschedule your appointment if you arrive late (15 or more minutes).  Arriving late affects you and other patients whose appointments are after yours.  Also, if you miss three or more appointments without notifying the office, you may be dismissed from the clinic at the provider's discretion.      For prescription refill requests, have your pharmacy contact our office and allow 72 hours for refills to be completed.    Today you received the following chemotherapy and/or immunotherapy agents Kanjinti & Perjeta      To help prevent nausea and vomiting after your treatment, we encourage you to take your nausea medication as directed.  BELOW ARE SYMPTOMS THAT SHOULD BE REPORTED IMMEDIATELY: *FEVER GREATER THAN 100.4 F (38 C) OR HIGHER *CHILLS OR SWEATING *NAUSEA AND VOMITING THAT IS NOT CONTROLLED WITH YOUR NAUSEA MEDICATION *UNUSUAL SHORTNESS OF BREATH *UNUSUAL BRUISING OR BLEEDING *URINARY PROBLEMS (pain or burning when urinating, or frequent urination) *BOWEL PROBLEMS (unusual diarrhea, constipation, pain near the anus) TENDERNESS IN MOUTH AND THROAT WITH OR WITHOUT PRESENCE OF ULCERS (sore throat, sores in mouth, or a toothache) UNUSUAL RASH, SWELLING OR PAIN  UNUSUAL VAGINAL DISCHARGE OR ITCHING   Items with * indicate a potential emergency and should be followed up as soon as possible or go to the Emergency Department if any problems should occur.  Please show the CHEMOTHERAPY ALERT CARD or IMMUNOTHERAPY ALERT CARD at  check-in to the Emergency Department and triage nurse.  Should you have questions after your visit or need to cancel or reschedule your appointment, please contact [facility] [place] [facility]  [phone] and follow the prompts.  Office hours are 8:00 a.m. to 4:30 p.m. Monday - Friday. Please note that voicemails left after 4:00 p.m. may not be returned until the following business day.  We are closed weekends and major holidays. You have access to a nurse at all times for urgent questions. Please call the main number to the clinic [phone] and follow the prompts.  For any non-urgent questions, you may also contact your provider using MyChart. We now offer e-Visits for anyone 18 and older to request care online for non-urgent symptoms. For details visit [email or web address].   Also download the MyChart app! Go to the app store, search "MyChart", open the app, select , and log in with your MyChart username and password.    

## 2022-06-05 NOTE — Assessment & Plan Note (Signed)
continue potassium chloride 20meq twice daily.  

## 2022-06-05 NOTE — Assessment & Plan Note (Signed)
Hemoglobin is stable. Monitor counts. 

## 2022-06-05 NOTE — Progress Notes (Signed)
Hematology/Oncology Progress note [phone] [phone]     CHIEF COMPLAINTS/REASON FOR VISIT:  Follow-up for Stage IIIB left breast cancer, ER-, HER2+  ASSESSMENT & PLAN:   Cancer Staging  Breast cancer [facility]) Staging form: Breast, AJCC 8th Edition - Clinical stage from 08/21/2021: Stage IIIB (cT4b, cN3c, cM0, G3, ER-, PR-, HER2+) - Signed by [name], MD on 09/12/2021  Breast cancer (HCC) Left breast cancer, cT4b N3 Mx, ER/PR-, HER2 + S/p neoadjuvant chemotherapy 6 cycles of TCHP  S/p left mastectomy + SLNB -ypT0 ypN0, complete pathological response.  Recommend adjuvant Transtuzumab and Pertuzumab to complete 1 year treatment if she tolerates.  Labs reviewed and discussed with patient.  Proceed with trastuzumab and pertuzumab today.   April 2024 echocardiogram- stable LVEF- repeat in July Follow up with radiation oncology for adjuvant radiation- delayed due to a small wound which was sutured recently.     Right shoulder pain and right upper quadrant pain, intermittent.  She has full range motion of right shoulder, no palpable tenderness of right chest wall/rib cage.  Will close monitor. Consider image work up if symptoms persist and worsen.    Anemia due to antineoplastic chemotherapy Hemoglobin is stable. Monitor counts.   Hypokalemia continue potassium chloride twice daily.   Anxiety associated with cancer diagnosis (HCC) Not taking low dose Xananx 0.25mg  daily PRN, she reports coping well.    Hypomagnesemia IV magnesium PRN if Mag <1.7 Continue Slow Mag 1 tab daily.     Orders Placed This Encounter  Procedures   CBC (Cancer Center Only)    Standing Status:   Future    Standing Expiration Date:   07/17/2023   CMP (Cancer Center only)    Standing Status:   Future    Standing Expiration Date:   07/17/2023   Magnesium    Standing Status:   Future    Standing Expiration Date:   07/17/2023     All questions were answered. The patient  knows to call the clinic with any problems questions or concerns.  Return of visit:  3 weeks lab MD Transtuzumab + Pertuzumab +/- IV magnesium  [name], MD, PhD [facility] Hematology Oncology 06/05/2022      HISTORY OF PRESENTING ILLNESS:  Vanessa Fisher is a  62 y.o.  female with PMH listed below who was referred to me for evaluation of  left breast cancer SUMMARY OF ONCOLOGIC HISTORY: Oncology History  Breast cancer (HCC)  07/31/2021 Imaging   Bilateral diagnostic mammogram and US showed 5 centimeter LEFT breast mass associated with pleomorphic calcifications is suspicious for invasive ductal carcinoma.At least 4 LEFT axillary lymph nodes with abnormal morphology   08/21/2021 Cancer Staging   Staging form: Breast, AJCC 8th Edition - Clinical: Stage IIIB (cT4b, cN3c, cM0, G3, ER-, PR-, HER2+) - Signed by [name], MD on 08/28/2021 Histologic grading system: 3 grade system  -08/21/21 left breast ultrasound-guided biopsy showed invasive mammary carcinoma, grade 3, ER/PR negative, HER2 positive.  Left axillary lymph node biopsy positive for macro metastatic mammary carcinoma, 8 mm in greatest extent.   08/30/2021 Imaging   MRI brain w wo contrast -No metastatic disease or acute intracranial abnormality. Essentially normal for age MRI appearance of the brain.    09/03/2021 Echocardiogram   1. Left ventricular ejection fraction, by estimation, is 55 to 60%. Left ventricular ejection fraction by 3D volume is 58 %. The left ventricle has normal function. The left ventricle has no regional wall motion abnormalities. There is  mild left  ventricular hypertrophy. Left ventricular diastolic parameters were normal.  2. Right ventricular systolic function is normal. The right ventricular size is normal.  3. The mitral valve is normal in structure. No evidence of mitral valve regurgitation.  4. The aortic valve was not well visualized. Aortic valve regurgitation is not visualized   09/10/2021  Imaging   PET scan showed Large hypermetabolic left breast mass and diffuse skin thickening,consistent with primary breast carcinoma. Hypermetabolic lymphadenopathy in the left axilla, left subpectoral region, and left supraclavicular region, consistent with metastatic disease. No evidence of metastatic disease within the abdomen or pelvis   09/12/2021 - 02/13/2022 Chemotherapy   Patient is on Treatment Plan : BREAST  Docetaxel + Carboplatin + Trastuzumab + Pertuzumab  (TCHP) q21d       Genetic Testing   Negative genetic testing. No pathogenic variants identified on the [facility] Common Hereditary Cancers+RNA panel. The report date is 10/26/2021.  The Common Hereditary Cancers Panel + RNA offered by [facility] includes sequencing and/or deletion duplication testing of the following 47 genes: APC, ATM, AXIN2, BARD1, BMPR1A, BRCA1, BRCA2, BRIP1, CDH1, CDKN2A (p14ARF), CDKN2A (p16INK4a), CKD4, CHEK2, CTNNA1, DICER1, EPCAM (Deletion/duplication testing only), GREM1 (promoter region deletion/duplication testing only), KIT, MEN1, MLH1, MSH2, MSH3, MSH6, MUTYH, NBN, NF1, NHTL1, PALB2, PDGFRA, PMS2, POLD1, POLE, PTEN, RAD50, RAD51C, RAD51D, SDHB, SDHC, SDHD, SMAD4, SMARCA4. STK11, TP53, TSC1, TSC2, and VHL.  The following genes were evaluated for sequence changes only: SDHA and HOXB13 c.251G>A variant only.   02/09/2022 Echocardiogram   LVEF 55 to 60%    03/27/2022 Surgery   S/p left mastectomy + SLNB ypT0 ypN0,    04/24/2022 -  Chemotherapy   Patient is on Treatment Plan : BREAST Trastuzumab  + Pertuzumab q21d x 13 cycles     05/06/2022 Echocardiogram   LVEF 55 to 60%      INTERVAL HISTORY Vanessa Fisher is a 62 y.o. female who has above history reviewed by me today presents for follow up visit for Triple positive breast cancer treatment.  Patient follows up with surgery for seroma aspiration. Manageable diarrhea symptoms.   Right shoulder and right upper quadrant pain, intermittently, triggered  when she bend down.    MEDICAL HISTORY:  Past Medical History:  Diagnosis Date   Anemia    Anxiety    Arthritis    Asthma    WELL CONTROLLED   Bile acid malabsorption syndrome    Bradycardia    HAD AN ISSUE WITH THIS IN 2016-NO PROBLEMS SINCE   Breast cancer, left breast (HCC) 08/2021   Carpal tunnel syndrome on right    Depression    Diabetes mellitus without complication (HCC)    Diverticulitis    Gastric reflux    GERD (gastroesophageal reflux disease)    Hand pain 05/12/2016   Headache    H/O MIGRAINES   History of kidney stones    H/O   Lumbar radiculitis    Neck pain, chronic    Osteoarthritis of both knees    Panic attacks     SURGICAL HISTORY: Past Surgical History:  Procedure Laterality Date   BREAST BIOPSY Left 08/21/2021   Axilla Bx, Hydromarker, neg   BREAST BIOPSY Left 08/21/2021   [place] Bx, Ribbon Clip, invasive mammary carcinoma   BREAST BIOPSY Left 03/12/2022   [place] LT RADIO FREQUENCY TAG LOC US GUIDE 03/12/2022 [facility]   BREAST BIOPSY Left 03/12/2022   [place] LT RADIO FREQUENCY TAG EA ADD LESION LOC US GUIDE 03/12/2022 [facility]  CARPAL TUNNEL RELEASE Right 12/06/2017   Procedure: CARPAL TUNNEL RELEASE;  Surgeon: Deeann Saint, MD;  Location: [facility] OR[facility];  Service: Orthopedics;  Laterality: Right;   COLONOSCOPY WITH PROPOFOL N/A 06/11/2021   Procedure: COLONOSCOPY WITH PROPOFOL;  Surgeon: Toney Reil, MD;  Location: [facility] ENDOSCOPY;  Service: Gastroenterology;  Laterality: N/A;   DORSAL COMPARTMENT RELEASE Right 12/06/2017   Procedure: RELEASE DORSAL COMPARTMENT (DEQUERVAIN);  Surgeon: Deeann Saint, MD;  Location: [facility]S;  Service: Orthopedics;  Laterality: Right;   ESOPHAGOGASTRODUODENOSCOPY (EGD) WITH PROPOFOL N/A 06/11/2021   Procedure: ESOPHAGOGASTRODUODENOSCOPY (EGD) WITH PROPOFOL;  Surgeon: Toney Reil, MD;  Location: [facility] ENDOSCOPY;  Service: Gastroenterology;  Laterality: N/A;   PART MASTECTOMY,RADIO FREQUENCY  LOCALIZER,AXILLARY SENTINEL NODE BIOPSY Left 03/27/2022   Procedure: PART MASTECTOMY,RADIO FREQUENCY LOCALIZER,AXILLARY SENTINEL NODE BIOPSY;  Surgeon: Carolan Shiver, MD;  Location: [facility];  Service: General;  Laterality: Left;   PARTIAL HYSTERECTOMY     PORTACATH PLACEMENT N/A 09/24/2021   Procedure: INSERTION PORT-A-CATH;  Surgeon: Carolan Shiver, MD;  Location: [facility] ORS;  Service: General;  Laterality: N/A;   TUBAL LIGATION      SOCIAL HISTORY: Social History   Socioeconomic History   Marital status: Single    Spouse name: Not on file   Number of children: Not on file   Years of education: Not on file   Highest education level: Not on file  Occupational History   Not on file  Tobacco Use   Smoking status: Former    Types: Cigarettes   Smokeless tobacco: Never  Vaping Use   Vaping Use: Never used  Substance and Sexual Activity   Alcohol use: No   Drug use: No   Sexual activity: Not on file  Other Topics Concern   Not on file  Social History Narrative   Lives alone   Social Determinants of Health   Financial Resource Strain: High Risk (08/15/2021)   Overall Financial Resource Strain (CARDIA)    Difficulty of Paying Living Expenses: Very hard  Food Insecurity: Food Insecurity Present (09/09/2021)   Hunger Vital Sign    Worried About Running Out of Food in the Last Year: Often true    Ran Out of Food in the Last Year: Often true  Transportation Needs: No Transportation Needs (08/15/2021)   PRAPARE - Administrator, Civil Service (Medical): No    Lack of Transportation (Non-Medical): No  Physical Activity: Inactive (08/15/2021)   Exercise Vital Sign    Days of Exercise per Week: 0 days    Minutes of Exercise per Session: 0 min  Stress: Stress Concern Present (08/15/2021)   [employer] of Occupational Health - Occupational Stress Questionnaire    Feeling of Stress : Rather much  Social Connections: Socially Isolated (08/15/2021)   Social  Connection and Isolation Panel [NHANES]    Frequency of Communication with Friends and Family: Once a week    Frequency of Social Gatherings with Friends and Family: Once a week    Attends Religious Services: Never    Database administrator or Organizations: No    Attends Engineer, structural: Not on file    Marital Status: Never married  Intimate Partner Violence: Not At Risk (08/15/2021)   Humiliation, Afraid, Rape, and Kick questionnaire    Fear of Current or Ex-Partner: No    Emotionally Abused: No    Physically Abused: No    Sexually Abused: No    FAMILY HISTORY: Family History  Problem Relation Age of Onset  Diabetes Mother    Hypertension Mother    Heart failure Mother    Pancreatic cancer Mother 29   Diabetes Father    Hypertension Father    Breast cancer Maternal Aunt    Cervical cancer Maternal Aunt    Lung cancer Son 58    ALLERGIES:  is allergic to bc powder [aspirin-salicylamide-caffeine], doxycycline, gabapentin, hydrocodone-acetaminophen, latex, penicillin g, penicillins, and shellfish allergy.  MEDICATIONS:  Current Outpatient Medications  Medication Sig Dispense Refill   albuterol (PROVENTIL) (2.5 MG/3ML) 0.083% nebulizer solution Take 3 mLs (2.5 mg total) by nebulization every 4 (four) hours as needed for wheezing or shortness of breath. 75 mL 2   albuterol (VENTOLIN HFA) 108 (90 Base) MCG/ACT inhaler Inhale 2 puffs into the lungs every 6 (six) hours as needed for wheezing or shortness of breath. 18 g 0   Azelastine HCl 137 MCG/SPRAY SOLN Place 1 spray into the nose daily. 30 mL 0   calcium carbonate (OS-CAL - DOSED IN MG OF ELEMENTAL CALCIUM) 1250 (500 Ca) MG tablet Take 1 tablet (1,250 mg total) by mouth daily. 90 tablet 1   cetirizine (ZYRTEC ALLERGY) 10 MG tablet Take 1 tablet (10 mg total) by mouth daily. 90 tablet 0   esomeprazole (NEXIUM) 40 MG capsule Take 1 capsule (40 mg total) by mouth daily at 12 noon. 30 capsule 3   FLOVENT DISKUS 250  MCG/ACT AEPB Inhale 1 puff into the lungs daily.     fluticasone (FLONASE) 50 MCG/ACT nasal spray Place 2 sprays into both nostrils daily. 15.8 mL 0   Iron-Vitamin C 65-125 MG TABS Take 1 tablet by mouth daily at 2 PM. 30 tablet 2   KLOR-CON M20 20 MEQ tablet TAKE 1 TABLET BY MOUTH TWICE A DAY 60 tablet 1   lidocaine-prilocaine (EMLA) cream Apply 1 Application topically as needed. 30 g 12   magic mouthwash (multi-ingredient) oral suspension Swish and spit 5-10 ml by mouth 4 times a day as needed 400 mL 1   magnesium chloride (SLOW-MAG) 64 MG TBEC SR tablet Take 1 tablet (64 mg total) by mouth daily. 30 tablet 3   montelukast (SINGULAIR) 10 MG tablet Take by mouth.     oxyCODONE-acetaminophen (PERCOCET/ROXICET) 5-325 MG tablet Take 1 tablet by mouth every 6 (six) hours as needed for pain.     tamsulosin (FLOMAX) 0.4 MG CAPS capsule Take 0.4 mg by mouth daily.     traZODone (DESYREL) 50 MG tablet Take 1 tablet (50 mg total) by mouth at bedtime as needed for sleep. 90 tablet 1   zinc gluconate 50 MG tablet Take 50 mg by mouth as needed.     diphenoxylate-atropine (LOMOTIL) 2.5-0.025 MG tablet Take 1 tablet by mouth 4 (four) times daily as needed for diarrhea or loose stools. 120 tablet 0   naloxone (NARCAN) nasal spray 4 mg/0.1 mL  (Patient not taking: Reported on 12/17/2021)     ondansetron (ZOFRAN) 8 MG tablet Take 1 tablet (8 mg total) by mouth 2 (two) times daily as needed (Nausea or vomiting). Start on the third day after chemotherapy. 30 tablet 1   prochlorperazine (COMPAZINE) 10 MG tablet Take 1 tablet (10 mg total) by mouth every 6 (six) hours as needed (Nausea or vomiting). 30 tablet 1   No current facility-administered medications for this visit.   Facility-Administered Medications Ordered in Other Visits  Medication Dose Route Frequency Provider Last Rate Last Admin   magnesium sulfate IVPB 2 g 50 mL  2 g Intravenous Once  Clent Jacks M, PA-C       pertuzumab (PERJETA) 420 mg in  sodium chloride 0.9 % 250 mL chemo infusion  420 mg Intravenous Once [name], MD       trastuzumab-anns [facility]) 600 mg in sodium chloride 0.9 % 250 mL chemo infusion  6 mg/kg (Treatment Plan Recorded) Intravenous Once [name], MD 557.1 mL/hr at 06/05/22 1135 600 mg at 06/05/22 1135    Review of Systems  Constitutional:  Negative for appetite change, chills, fatigue and fever.  HENT:   Negative for hearing loss and voice change.        Nasal congestion, pressure  Eyes:  Negative for eye problems.  Respiratory:  Negative for chest tightness and cough.   Cardiovascular:  Negative for chest pain.  Gastrointestinal:  Positive for diarrhea. Negative for abdominal distention, abdominal pain and blood in stool.  Endocrine: Negative for hot flashes.  Genitourinary:  Negative for difficulty urinating and frequency.   Musculoskeletal:  Positive for arthralgias and back pain.  Skin:  Negative for itching and rash.  Neurological:  Positive for headaches. Negative for extremity weakness.  Hematological:  Negative for adenopathy.  Psychiatric/Behavioral:  Negative for confusion.      PHYSICAL EXAMINATION: ECOG PERFORMANCE STATUS: 1 - Symptomatic but completely ambulatory Vitals:   06/05/22 0926  BP: (!) 110/59  Pulse: (!) 56  Resp: 17  Temp: (!) 97 F (36.1 C)  SpO2: 100%   Filed Weights   06/05/22 0926  Weight: 211 lb (95.7 kg)    Physical Exam Constitutional:      General: She is not in acute distress.    Appearance: She is not diaphoretic.  HENT:     Head: Normocephalic and atraumatic.     Nose: Nose normal.     Mouth/Throat:     Pharynx: No oropharyngeal exudate.  Eyes:     General: No scleral icterus.    Pupils: Pupils are equal, round, and reactive to light.  Cardiovascular:     Rate and Rhythm: Normal rate and regular rhythm.     Heart sounds: No murmur heard. Pulmonary:     Effort: Pulmonary effort is normal. No respiratory distress.     Breath sounds: No rales.   Chest:     Chest wall: No tenderness.  Abdominal:     General: There is no distension.     Palpations: Abdomen is soft.     Tenderness: There is no abdominal tenderness.  Musculoskeletal:        General: Normal range of motion.     Cervical back: Normal range of motion and neck supple.  Skin:    General: Skin is warm and dry.     Findings: No erythema.  Neurological:     Mental Status: She is alert and oriented to person, place, and time.     Cranial Nerves: No cranial nerve deficit.     Motor: No abnormal muscle tone.     Coordination: Coordination normal.  Psychiatric:        Mood and Affect: Affect normal.     LABORATORY DATA:  I have reviewed the data as listed    Latest Ref Rng & Units 06/05/2022    9:04 AM 05/15/2022    8:46 AM 05/05/2022    9:35 AM  CBC  WBC 4.0 - 10.5 K/uL 4.4  3.9  4.2   Hemoglobin 12.0 - 15.0 g/dL 9.2  9.3  9.0   Hematocrit 36.0 - 46.0 % 29.5  30.0  28.6   Platelets 150 - 400 K/uL 209  224  284       Latest Ref Rng & Units 06/05/2022    9:04 AM 05/15/2022    8:31 AM 05/05/2022    9:35 AM  CMP  Glucose 70 - 99 mg/dL 161  096  045   BUN 6 - 20 mg/dL 13  18  23    Creatinine 0.44 - 1.00 mg/dL 4.09  8.11  9.14   Sodium 135 - 145 mmol/L 139  137  136   Potassium 3.5 - 5.1 mmol/L 3.7  4.2  4.1   Chloride 98 - 111 mmol/L 105  106  105   CO2 22 - 32 mmol/L 26  25  24    Calcium 8.9 - 10.3 mg/dL 8.5  9.2  8.7   Total Protein 6.5 - 8.1 g/dL 6.9  7.3    Total Bilirubin 0.3 - 1.2 mg/dL 0.5  0.2    Alkaline Phos 38 - 126 U/L 85  85    AST 15 - 41 U/L 20  18    ALT 0 - 44 U/L 13  11        RADIOGRAPHIC STUDIES: I have personally reviewed the radiological images as listed and agreed with the findings in the report. No results found.

## 2022-06-05 NOTE — Assessment & Plan Note (Signed)
IV magnesium PRN if Mag <1.7 Continue Slow Mag 1 tab daily.  

## 2022-06-05 NOTE — Progress Notes (Signed)
Patient here for oncology follow-up appointment, concerns of spasms

## 2022-06-05 NOTE — Patient Instructions (Signed)

## 2022-06-05 NOTE — Assessment & Plan Note (Addendum)
Not taking low dose Xananx 0.25mg  daily PRN, she reports coping well.

## 2022-06-05 NOTE — Assessment & Plan Note (Addendum)
Left breast cancer, cT4b N3 Mx, ER/PR-, HER2 + S/p neoadjuvant chemotherapy 6 cycles of TCHP  S/p left mastectomy + SLNB -ypT0 ypN0, complete pathological response.  Recommend adjuvant Transtuzumab and Pertuzumab to complete 1 year treatment if she tolerates.  Labs reviewed and discussed with patient.  Proceed with trastuzumab and pertuzumab today.   April 2024 echocardiogram- stable LVEF- repeat in July Follow up with radiation oncology for adjuvant radiation- delayed due to a small wound which was sutured recently.     Right shoulder pain and right upper quadrant pain, intermittent.  She has full range motion of right shoulder, no palpable tenderness of right chest wall/rib cage.  Will close monitor. Consider image work up if symptoms persist and worsen.

## 2022-06-08 ENCOUNTER — Ambulatory Visit: Payer: Medicare HMO

## 2022-06-09 ENCOUNTER — Ambulatory Visit: Payer: Medicare HMO

## 2022-06-10 ENCOUNTER — Ambulatory Visit: Payer: Medicare HMO

## 2022-06-11 ENCOUNTER — Ambulatory Visit: Payer: Medicare HMO

## 2022-06-12 ENCOUNTER — Ambulatory Visit: Payer: Medicare HMO

## 2022-06-15 ENCOUNTER — Ambulatory Visit: Payer: Medicare HMO

## 2022-06-16 ENCOUNTER — Ambulatory Visit: Payer: Medicare HMO | Admitting: Gastroenterology

## 2022-06-16 ENCOUNTER — Ambulatory Visit: Payer: Medicare HMO

## 2022-06-17 ENCOUNTER — Ambulatory Visit: Payer: Medicare HMO | Admitting: Gastroenterology

## 2022-06-17 ENCOUNTER — Inpatient Hospital Stay: Payer: Medicare HMO

## 2022-06-17 ENCOUNTER — Ambulatory Visit
Admission: RE | Admit: 2022-06-17 | Discharge: 2022-06-17 | Disposition: A | Discharge status: Home / Self Care | Payer: Medicare HMO | Source: Ambulatory Visit | Attending: Radiation Oncology | Admitting: Radiation Oncology

## 2022-06-17 ENCOUNTER — Other Ambulatory Visit: Payer: Self-pay

## 2022-06-17 DIAGNOSIS — Z510 Encounter for antineoplastic radiation therapy: Secondary | ICD-10-CM | POA: Insufficient documentation

## 2022-06-17 DIAGNOSIS — Z5112 Encounter for antineoplastic immunotherapy: Secondary | ICD-10-CM | POA: Diagnosis not present

## 2022-06-17 DIAGNOSIS — C50012 Malignant neoplasm of nipple and areola, left female breast: Secondary | ICD-10-CM | POA: Insufficient documentation

## 2022-06-17 LAB — RAD ONC ARIA SESSION SUMMARY
Course Elapsed Days: 0
Plan Fractions Treated to Date: 1
Plan Prescribed Dose Per Fraction: 1.8 Gy
Plan Total Fractions Prescribed: 28
Plan Total Prescribed Dose: 50.4 Gy
Reference Point Dosage Given to Date: 1.8 Gy
Reference Point Session Dosage Given: 1.8 Gy
Session Number: 1

## 2022-06-18 ENCOUNTER — Ambulatory Visit
Admission: RE | Admit: 2022-06-18 | Discharge: 2022-06-18 | Disposition: A | Discharge status: Home / Self Care | Payer: Medicare HMO | Source: Ambulatory Visit | Attending: Radiation Oncology | Admitting: Radiation Oncology

## 2022-06-18 ENCOUNTER — Other Ambulatory Visit: Payer: Self-pay

## 2022-06-18 DIAGNOSIS — Z5112 Encounter for antineoplastic immunotherapy: Secondary | ICD-10-CM | POA: Diagnosis not present

## 2022-06-18 LAB — RAD ONC ARIA SESSION SUMMARY
Course Elapsed Days: 1
Plan Fractions Treated to Date: 2
Plan Prescribed Dose Per Fraction: 1.8 Gy
Plan Total Fractions Prescribed: 28
Plan Total Prescribed Dose: 50.4 Gy
Reference Point Dosage Given to Date: 3.6 Gy
Reference Point Session Dosage Given: 1.8 Gy
Session Number: 2

## 2022-06-19 ENCOUNTER — Telehealth: Payer: Self-pay | Admitting: *Deleted

## 2022-06-19 ENCOUNTER — Inpatient Hospital Stay: Payer: Medicare HMO

## 2022-06-19 ENCOUNTER — Ambulatory Visit
Admission: RE | Admit: 2022-06-19 | Discharge: 2022-06-19 | Disposition: A | Discharge status: Home / Self Care | Payer: Medicare HMO | Source: Ambulatory Visit | Attending: Radiation Oncology | Admitting: Radiation Oncology

## 2022-06-19 ENCOUNTER — Inpatient Hospital Stay (HOSPITAL_BASED_OUTPATIENT_CLINIC_OR_DEPARTMENT_OTHER): Payer: Medicare HMO | Admitting: Hospice and Palliative Medicine

## 2022-06-19 ENCOUNTER — Other Ambulatory Visit: Payer: Self-pay

## 2022-06-19 ENCOUNTER — Other Ambulatory Visit: Payer: Self-pay | Admitting: *Deleted

## 2022-06-19 ENCOUNTER — Encounter: Payer: Self-pay | Admitting: Hospice and Palliative Medicine

## 2022-06-19 VITALS — BP 108/40 | HR 62 | Temp 96.7°F | Resp 18 | Ht 66.0 in | Wt 211.0 lb

## 2022-06-19 DIAGNOSIS — E876 Hypokalemia: Secondary | ICD-10-CM

## 2022-06-19 DIAGNOSIS — R0981 Nasal congestion: Secondary | ICD-10-CM

## 2022-06-19 DIAGNOSIS — R051 Acute cough: Secondary | ICD-10-CM

## 2022-06-19 DIAGNOSIS — R300 Dysuria: Secondary | ICD-10-CM | POA: Diagnosis not present

## 2022-06-19 DIAGNOSIS — Z5112 Encounter for antineoplastic immunotherapy: Secondary | ICD-10-CM | POA: Diagnosis not present

## 2022-06-19 LAB — RAD ONC ARIA SESSION SUMMARY
Course Elapsed Days: 2
Plan Fractions Treated to Date: 3
Plan Prescribed Dose Per Fraction: 1.8 Gy
Plan Total Fractions Prescribed: 28
Plan Total Prescribed Dose: 50.4 Gy
Reference Point Dosage Given to Date: 5.4 Gy
Reference Point Session Dosage Given: 1.8 Gy
Session Number: 3

## 2022-06-19 LAB — URINALYSIS, COMPLETE (UACMP) WITH MICROSCOPIC
Bacteria, UA: NONE SEEN
Bilirubin Urine: NEGATIVE
Glucose, UA: NEGATIVE mg/dL
Hgb urine dipstick: NEGATIVE
Ketones, ur: NEGATIVE mg/dL
Nitrite: NEGATIVE
Protein, ur: NEGATIVE mg/dL
Specific Gravity, Urine: 1.027 (ref 1.005–1.030)
pH: 5 (ref 5.0–8.0)

## 2022-06-19 LAB — RESPIRATORY PANEL BY PCR

## 2022-06-19 LAB — CBC WITH DIFFERENTIAL (CANCER CENTER ONLY)
Abs Immature Granulocytes: 0.02 10*3/uL (ref 0.00–0.07)
Basophils Absolute: 0 10*3/uL (ref 0.0–0.1)
Basophils Relative: 1 %
Eosinophils Absolute: 0.1 10*3/uL (ref 0.0–0.5)
Eosinophils Relative: 2 %
HCT: 31.3 % — ABNORMAL LOW (ref 36.0–46.0)
Hemoglobin: 9.6 g/dL — ABNORMAL LOW (ref 12.0–15.0)
Immature Granulocytes: 0 %
Lymphocytes Relative: 37 %
Lymphs Abs: 1.8 10*3/uL (ref 0.7–4.0)
MCH: 26.2 pg (ref 26.0–34.0)
MCHC: 30.7 g/dL (ref 30.0–36.0)
MCV: 85.5 fL (ref 80.0–100.0)
Monocytes Absolute: 0.4 10*3/uL (ref 0.1–1.0)
Monocytes Relative: 9 %
Neutro Abs: 2.5 10*3/uL (ref 1.7–7.7)
Neutrophils Relative %: 51 %
Platelet Count: 265 10*3/uL (ref 150–400)
RBC: 3.66 MIL/uL — ABNORMAL LOW (ref 3.87–5.11)
RDW: 14.9 % (ref 11.5–15.5)
WBC Count: 4.8 10*3/uL (ref 4.0–10.5)
nRBC: 0 % (ref 0.0–0.2)

## 2022-06-19 LAB — CMP (CANCER CENTER ONLY)
ALT: 14 U/L (ref 0–44)
AST: 17 U/L (ref 15–41)
Albumin: 3.7 g/dL (ref 3.5–5.0)
Alkaline Phosphatase: 91 U/L (ref 38–126)
Anion gap: 8 (ref 5–15)
BUN: 18 mg/dL (ref 6–20)
CO2: 25 mmol/L (ref 22–32)
Calcium: 9 mg/dL (ref 8.9–10.3)
Chloride: 106 mmol/L (ref 98–111)
Creatinine: 1.02 mg/dL — ABNORMAL HIGH (ref 0.44–1.00)
GFR, Estimated: >60 mL/min (ref >60)
Glucose, Bld: 110 mg/dL — ABNORMAL HIGH (ref 70–99)
Potassium: 3.9 mmol/L (ref 3.5–5.1)
Sodium: 139 mmol/L (ref 135–145)
Total Bilirubin: 0.2 mg/dL — ABNORMAL LOW (ref 0.3–1.2)
Total Protein: 7.1 g/dL (ref 6.5–8.1)

## 2022-06-19 LAB — MAGNESIUM: Magnesium: 1.5 mg/dL — ABNORMAL LOW (ref 1.7–2.4)

## 2022-06-19 MED ORDER — SODIUM CHLORIDE 0.9% FLUSH
10.0000 mL | Freq: Once | INTRAVENOUS | Status: AC
  Administered 2022-06-19: 10 mL via INTRAVENOUS
  Filled 2022-06-19: qty 10

## 2022-06-19 MED ORDER — HEPARIN SOD (PORK) LOCK FLUSH 100 UNIT/ML IV SOLN
500.0000 [IU] | Freq: Once | INTRAVENOUS | Status: AC
  Administered 2022-06-19: 500 [IU] via INTRAVENOUS
  Filled 2022-06-19: qty 5

## 2022-06-19 NOTE — Progress Notes (Addendum)
Symptom Management Clinic [facility] at [facility] [phone] [phone]  Patient Care Team: [facility] as PCP - General Hulen Luster, RN as Oncology Nurse Navigator [name], MD as Consulting Physician (Oncology)   NAME OF PATIENT: Vanessa Fisher  [id number]  February 08, 1960   DATE OF VISIT: 06/19/22  REASON FOR CONSULT: Vanessa Fisher is a 62 y.o. 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  Patient is status post 6 cycles of neoadjuvant TCHP chemotherapy followed by left mastectomy and adjuvant Transtuzumab and Pertuzumab.   INTERVAL HISTORY: Patient presents [facility] today for evaluation of runny nose for several days, which she attributes to possible allergies.  She also says that she has had some dysuria and back pain and wanted to make sure she does not have a urinary tract infection.  Patient reports that she often has the symptoms when she has a kidney stone, which she has had frequently in the past.  She denies hematuria, fever, or chills.  No suprapubic pain.  No changes in urinary pattern such as frequency or urgency.  Patient denies sore throat, cough, or congestion.  She says that she is taking Claritin but does not find it particularly helpful.  She has a history of seasonal allergies.  At times, she takes Singulair, which she does report helps symptoms.  No active diarrhea, nausea, or vomiting.  No fever or chills.  Appetite is reportedly good.  No urinary complaints.  Patient denies any further complaints today.  PAST MEDICAL HISTORY: Past Medical History:  Diagnosis Date   Anemia    Anxiety    Arthritis    Asthma    WELL CONTROLLED   Bile acid malabsorption syndrome    Bradycardia    HAD AN ISSUE WITH THIS IN 2016-NO PROBLEMS SINCE   Breast cancer, left breast (HCC) 08/2021   Carpal tunnel syndrome on right    Depression    Diabetes mellitus  without complication (HCC)    Diverticulitis    Gastric reflux    GERD (gastroesophageal reflux disease)    Hand pain 05/12/2016   Headache    H/O MIGRAINES   History of kidney stones    H/O   Lumbar radiculitis    Neck pain, chronic    Osteoarthritis of both knees    Panic attacks     PAST SURGICAL HISTORY:  Past Surgical History:  Procedure Laterality Date   BREAST BIOPSY Left 08/21/2021   Axilla Bx, Hydromarker, neg   BREAST BIOPSY Left 08/21/2021   [place] Bx, Ribbon Clip, invasive mammary carcinoma   BREAST BIOPSY Left 03/12/2022   [place] LT RADIO FREQUENCY TAG LOC US GUIDE 03/12/2022 [facility]   BREAST BIOPSY Left 03/12/2022   [place] LT RADIO FREQUENCY TAG EA ADD LESION LOC US GUIDE 03/12/2022 [facility]   CARPAL TUNNEL RELEASE Right 12/06/2017   Procedure: CARPAL TUNNEL RELEASE;  Surgeon: Deeann Saint, MD;  Location: [facility];  Service: Orthopedics;  Laterality: Right;   COLONOSCOPY WITH PROPOFOL N/A 06/11/2021   Procedure: COLONOSCOPY WITH PROPOFOL;  Surgeon: Toney Reil, MD;  Location: [facility]COPY;  Service: Gastroenterology;  Laterality: N/A;   DORSAL COMPARTMENT RELEASE Right 12/06/2017   Procedure: RELEASE DORSAL COMPARTMENT (DEQUERVAIN);  Surgeon: Deeann Saint, MD;  Location: [facility];  Service: Orthopedics;  Laterality: Right;   ESOPHAGOGASTRODUODENOSCOPY (EGD) WITH PROPOFOL N/A 06/11/2021   Procedure: ESOPHAGOGASTRODUODENOSCOPY (EGD) WITH PROPOFOL;  Surgeon: Toney Reil, MD;  Location:  [facility];  Service: Gastroenterology;  Laterality: N/A;   PART MASTECTOMY,RADIO FREQUENCY LOCALIZER,AXILLARY SENTINEL NODE BIOPSY Left 03/27/2022   Procedure: PART MASTECTOMY,RADIO FREQUENCY LOCALIZER,AXILLARY SENTINEL NODE BIOPSY;  Surgeon: Carolan Shiver, MD;  Location: [facility];  Service: General;  Laterality: Left;   PARTIAL HYSTERECTOMY     PORTACATH PLACEMENT N/A 09/24/2021   Procedure: INSERTION PORT-A-CATH;  Surgeon: Carolan Shiver, MD;   Location: [facility];  Service: General;  Laterality: N/A;   TUBAL LIGATION      HEMATOLOGY/ONCOLOGY HISTORY:  Oncology History  Breast cancer (HCC)  07/31/2021 Imaging   Bilateral diagnostic mammogram and US showed 5 centimeter LEFT breast mass associated with pleomorphic calcifications is suspicious for invasive ductal carcinoma.At least 4 LEFT axillary lymph nodes with abnormal morphology   08/21/2021 Cancer Staging   Staging form: Breast, AJCC 8th Edition - Clinical: Stage IIIB (cT4b, cN3c, cM0, G3, ER-, PR-, HER2+) - Signed by [name], MD on 08/28/2021 Histologic grading system: 3 grade system  -08/21/21 left breast ultrasound-guided biopsy showed invasive mammary carcinoma, grade 3, ER/PR negative, HER2 positive.  Left axillary lymph node biopsy positive for macro metastatic mammary carcinoma, 8 mm in greatest extent.   08/30/2021 Imaging   MRI brain w wo contrast -No metastatic disease or acute intracranial abnormality. Essentially normal for age MRI appearance of the brain.    09/03/2021 Echocardiogram   1. Left ventricular ejection fraction, by estimation, is 55 to 60%. Left ventricular ejection fraction by 3D volume is 58 %. The left ventricle has normal function. The left ventricle has no regional wall motion abnormalities. There is mild left  ventricular hypertrophy. Left ventricular diastolic parameters were normal.  2. Right ventricular systolic function is normal. The right ventricular size is normal.  3. The mitral valve is normal in structure. No evidence of mitral valve regurgitation.  4. The aortic valve was not well visualized. Aortic valve regurgitation is not visualized   09/10/2021 Imaging   PET scan showed Large hypermetabolic left breast mass and diffuse skin thickening,consistent with primary breast carcinoma. Hypermetabolic lymphadenopathy in the left axilla, left subpectoral region, and left supraclavicular region, consistent with metastatic disease. No evidence  of metastatic disease within the abdomen or pelvis   09/12/2021 - 02/13/2022 Chemotherapy   Patient is on Treatment Plan : BREAST  Docetaxel + Carboplatin + Trastuzumab + Pertuzumab  (TCHP) q21d       Genetic Testing   Negative genetic testing. No pathogenic variants identified on the [facility] Common Hereditary Cancers+RNA panel. The report date is 10/26/2021.  The Common Hereditary Cancers Panel + RNA offered by [facility] includes sequencing and/or deletion duplication testing of the following 47 genes: APC, ATM, AXIN2, BARD1, BMPR1A, BRCA1, BRCA2, BRIP1, CDH1, CDKN2A (p14ARF), CDKN2A (p16INK4a), CKD4, CHEK2, CTNNA1, DICER1, EPCAM (Deletion/duplication testing only), GREM1 (promoter region deletion/duplication testing only), KIT, MEN1, MLH1, MSH2, MSH3, MSH6, MUTYH, NBN, NF1, NHTL1, PALB2, PDGFRA, PMS2, POLD1, POLE, PTEN, RAD50, RAD51C, RAD51D, SDHB, SDHC, SDHD, SMAD4, SMARCA4. STK11, TP53, TSC1, TSC2, and VHL.  The following genes were evaluated for sequence changes only: SDHA and HOXB13 c.251G>A variant only.   02/09/2022 Echocardiogram   LVEF 55 to 60%    03/27/2022 Surgery   S/p left mastectomy + SLNB ypT0 ypN0,    04/24/2022 -  Chemotherapy   Patient is on Treatment Plan : BREAST Trastuzumab  + Pertuzumab q21d x 13 cycles     05/06/2022 Echocardiogram   LVEF 55 to 60%      ALLERGIES:  is allergic to bc powder [  aspirin-salicylamide-caffeine], doxycycline, gabapentin, hydrocodone-acetaminophen, latex, penicillin g, penicillins, and shellfish allergy.  MEDICATIONS:  Current Outpatient Medications  Medication Sig Dispense Refill   albuterol (PROVENTIL) (2.5 MG/3ML) 0.083% nebulizer solution Take 3 mLs (2.5 mg total) by nebulization every 4 (four) hours as needed for wheezing or shortness of breath. 75 mL 2   albuterol (VENTOLIN HFA) 108 (90 Base) MCG/ACT inhaler Inhale 2 puffs into the lungs every 6 (six) hours as needed for wheezing or shortness of breath. 18 g 0   Azelastine HCl 137 MCG/SPRAY  SOLN Place 1 spray into the nose daily. 30 mL 0   calcium carbonate (OS-CAL - DOSED IN MG OF ELEMENTAL CALCIUM) 1250 (500 Ca) MG tablet Take 1 tablet (1,250 mg total) by mouth daily. 90 tablet 1   cetirizine (ZYRTEC ALLERGY) 10 MG tablet Take 1 tablet (10 mg total) by mouth daily. 90 tablet 0   diphenoxylate-atropine (LOMOTIL) 2.5-0.025 MG tablet Take 1 tablet by mouth 4 (four) times daily as needed for diarrhea or loose stools. 120 tablet 0   esomeprazole (NEXIUM) 40 MG capsule Take 1 capsule (40 mg total) by mouth daily at 12 noon. 30 capsule 3   FLOVENT DISKUS 250 MCG/ACT AEPB Inhale 1 puff into the lungs daily.     fluticasone (FLONASE) 50 MCG/ACT nasal spray Place 2 sprays into both nostrils daily. 15.8 mL 0   Iron-Vitamin C 65-125 MG TABS Take 1 tablet by mouth daily at 2 PM. 30 tablet 2   KLOR-CON M20 20 MEQ tablet TAKE 1 TABLET BY MOUTH TWICE A DAY 60 tablet 1   lidocaine-prilocaine (EMLA) cream Apply 1 Application topically as needed. 30 g 12   magic mouthwash (multi-ingredient) oral suspension Swish and spit 5-10 ml by mouth 4 times a day as needed 400 mL 1   magnesium chloride (SLOW-MAG) 64 MG TBEC SR tablet Take 1 tablet (64 mg total) by mouth daily. 30 tablet 3   montelukast (SINGULAIR) 10 MG tablet Take by mouth.     naloxone (NARCAN) nasal spray 4 mg/0.1 mL  (Patient not taking: Reported on 12/17/2021)     ondansetron (ZOFRAN) 8 MG tablet Take 1 tablet (8 mg total) by mouth 2 (two) times daily as needed (Nausea or vomiting). Start on the third day after chemotherapy. 30 tablet 1   oxyCODONE-acetaminophen (PERCOCET/ROXICET) 5-325 MG tablet Take 1 tablet by mouth every 6 (six) hours as needed for pain.     prochlorperazine (COMPAZINE) 10 MG tablet Take 1 tablet (10 mg total) by mouth every 6 (six) hours as needed (Nausea or vomiting). 30 tablet 1   tamsulosin (FLOMAX) 0.4 MG CAPS capsule Take 0.4 mg by mouth daily.     traZODone (DESYREL) 50 MG tablet Take 1 tablet (50 mg total) by  mouth at bedtime as needed for sleep. 90 tablet 1   zinc gluconate 50 MG tablet Take 50 mg by mouth as needed.     No current facility-administered medications for this visit.   Facility-Administered Medications Ordered in Other Visits  Medication Dose Route Frequency Provider Last Rate Last Admin   heparin lock flush 100 unit/mL  500 Units Intravenous Once Berlyn Saylor, Daryl Eastern, NP       magnesium sulfate IVPB 2 g 50 mL  2 g Intravenous Once Covington, Sarah M, PA-C       sodium chloride flush (NS) 0.9 % injection 10 mL  10 mL Intravenous Once Sankalp Ferrell, Daryl Eastern, NP        VITAL SIGNS: There were  no vitals taken for this visit. There were no vitals filed for this visit.   Estimated body mass index is 34.06 kg/m as calculated from the following:   Height as of 05/07/22: 5\' 6"  (1.676 m).   Weight as of 06/05/22: 211 lb (95.7 kg).  LABS: CBC:    Component Value Date/Time   WBC 4.4 06/05/2022 0904   WBC 4.7 04/10/2022 0932   HGB 9.2 (L) 06/05/2022 0904   HGB 11.9 (L) 05/27/2014 2215   HCT 29.5 (L) 06/05/2022 0904   HCT 36.4 05/27/2014 2215   PLT 209 06/05/2022 0904   PLT 293 05/27/2014 2215   MCV 85.8 06/05/2022 0904   MCV 81 05/27/2014 2215   NEUTROABS 1.6 (L) 05/15/2022 0846   NEUTROABS 5.4 05/27/2014 2215   LYMPHSABS 2.0 05/15/2022 0846   LYMPHSABS 3.0 05/27/2014 2215   MONOABS 0.4 05/15/2022 0846   MONOABS 0.6 05/27/2014 2215   EOSABS 0.0 05/15/2022 0846   EOSABS 0.0 05/27/2014 2215   BASOSABS 0.0 05/15/2022 0846   BASOSABS 0.0 05/27/2014 2215   Comprehensive Metabolic Panel:    Component Value Date/Time   NA 139 06/05/2022 0904   NA 143 07/08/2016 1259   NA 139 05/27/2014 2215   K 3.7 06/05/2022 0904   K 3.5 05/27/2014 2215   CL 105 06/05/2022 0904   CL 105 05/27/2014 2215   CO2 26 06/05/2022 0904   CO2 26 05/27/2014 2215   BUN 13 06/05/2022 0904   BUN 10 07/08/2016 1259   BUN 14 05/27/2014 2215   CREATININE 0.90 06/05/2022 0904   CREATININE 1.09 (H) 05/27/2014  2215   GLUCOSE 117 (H) 06/05/2022 0904   GLUCOSE 104 (H) 05/27/2014 2215   CALCIUM 8.5 (L) 06/05/2022 0904   CALCIUM 9.0 05/27/2014 2215   AST 20 06/05/2022 0904   ALT 13 06/05/2022 0904   ALT 24 08/05/2013 0832   ALKPHOS 85 06/05/2022 0904   ALKPHOS 97 08/05/2013 0832   BILITOT 0.5 06/05/2022 0904   PROT 6.9 06/05/2022 0904   PROT 7.0 07/08/2016 1259   PROT 6.8 08/05/2013 0832   ALBUMIN 3.5 06/05/2022 0904   ALBUMIN 4.0 07/08/2016 1259   ALBUMIN 3.4 08/05/2013 0832    RADIOGRAPHIC STUDIES: No results found.  PERFORMANCE STATUS (ECOG) : 1 - Symptomatic but completely ambulatory  Review of Systems Unless otherwise noted, a complete review of systems is negative.  Physical Exam General: NAD Cardiovascular: regular rate and rhythm Pulmonary: clear ant fields Abdomen: soft, nontender, + bowel sounds GU: no suprapubic tenderness Extremities: no edema, no joint deformities Skin: no rashes Neurological: Weakness but otherwise nonfocal   IMPRESSION/PLAN: Rhinorrhea -this certainly could reflect seasonal allergies or viral infection is also possible.  Will send respiratory PCR panel.  Recommend symptomatic/supportive care.  Continue antihistamine/Singulair.  Consider nasal steroid.  Dysuria -urinalysis not suggestive of infection.  Recommend increased fluids.  Will await results of urine culture.  Hypomagnesemia -continue oral supplement.  Patient was not interested in IV fluids or IV supplementation today.  Labs next week.  RTC as scheduled to see Dr. Cathie Hoops next week   Patient expressed understanding and was in agreement with this plan. She also understands that She can call clinic at any time with any questions, concerns, or complaints.   Thank you for allowing me to participate in the care of this very pleasant patient.   Time Total: 15 minutes  Visit consisted of counseling and education dealing with the complex and emotionally intense issues of symptom management  in the  setting of serious illness.Greater than 50%  of this time was spent counseling and coordinating care related to the above assessment and plan.  Signed by: Laurette Schimke, PhD, NP-C

## 2022-06-19 NOTE — Telephone Encounter (Signed)
Patient scheduled for Symptom Management Clinic after her XRT

## 2022-06-19 NOTE — Telephone Encounter (Signed)
Patient called asking to be seen when she comes in for radiation therapy, her appointment is at 930. She has a runny nose, non productive cough and states that this is not allergies. She has hives post chemotherapy. She thinks that she may have a UTI as she is having low back pain. Denies fever. Please advise

## 2022-06-20 ENCOUNTER — Encounter: Payer: Self-pay | Admitting: Oncology

## 2022-06-21 LAB — URINE CULTURE

## 2022-06-22 ENCOUNTER — Ambulatory Visit
Admission: RE | Admit: 2022-06-22 | Discharge: 2022-06-22 | Disposition: A | Discharge status: Home / Self Care | Payer: Medicare HMO | Source: Ambulatory Visit | Attending: Radiation Oncology | Admitting: Radiation Oncology

## 2022-06-22 ENCOUNTER — Other Ambulatory Visit: Payer: Self-pay

## 2022-06-22 ENCOUNTER — Ambulatory Visit: Payer: Medicare HMO

## 2022-06-22 DIAGNOSIS — Z5112 Encounter for antineoplastic immunotherapy: Secondary | ICD-10-CM | POA: Diagnosis not present

## 2022-06-22 LAB — RAD ONC ARIA SESSION SUMMARY
Course Elapsed Days: 5
Plan Fractions Treated to Date: 4
Plan Prescribed Dose Per Fraction: 1.8 Gy
Plan Total Fractions Prescribed: 28
Plan Total Prescribed Dose: 50.4 Gy
Reference Point Dosage Given to Date: 7.2 Gy
Reference Point Session Dosage Given: 1.8 Gy
Session Number: 4

## 2022-06-23 ENCOUNTER — Ambulatory Visit
Admission: RE | Admit: 2022-06-23 | Discharge: 2022-06-23 | Disposition: A | Discharge status: Home / Self Care | Payer: Medicare HMO | Source: Ambulatory Visit | Attending: Radiation Oncology | Admitting: Radiation Oncology

## 2022-06-23 ENCOUNTER — Other Ambulatory Visit: Payer: Self-pay

## 2022-06-23 ENCOUNTER — Ambulatory Visit: Payer: Medicare HMO

## 2022-06-23 DIAGNOSIS — Z5112 Encounter for antineoplastic immunotherapy: Secondary | ICD-10-CM | POA: Diagnosis not present

## 2022-06-23 LAB — RAD ONC ARIA SESSION SUMMARY
Course Elapsed Days: 6
Plan Fractions Treated to Date: 1
Plan Prescribed Dose Per Fraction: 1.8 Gy
Plan Total Fractions Prescribed: 24
Plan Total Prescribed Dose: 43.2 Gy
Reference Point Dosage Given to Date: 9 Gy
Reference Point Session Dosage Given: 1.8 Gy
Session Number: 5

## 2022-06-24 ENCOUNTER — Ambulatory Visit
Admission: RE | Admit: 2022-06-24 | Discharge: 2022-06-24 | Disposition: A | Discharge status: Home / Self Care | Payer: Medicare HMO | Source: Ambulatory Visit | Attending: Radiation Oncology | Admitting: Radiation Oncology

## 2022-06-24 ENCOUNTER — Ambulatory Visit: Payer: Medicare HMO

## 2022-06-24 ENCOUNTER — Other Ambulatory Visit: Payer: Self-pay

## 2022-06-24 DIAGNOSIS — Z5112 Encounter for antineoplastic immunotherapy: Secondary | ICD-10-CM | POA: Diagnosis not present

## 2022-06-24 LAB — RAD ONC ARIA SESSION SUMMARY
Course Elapsed Days: 7
Plan Fractions Treated to Date: 2
Plan Prescribed Dose Per Fraction: 1.8 Gy
Plan Total Fractions Prescribed: 24
Plan Total Prescribed Dose: 43.2 Gy
Reference Point Dosage Given to Date: 10.8 Gy
Reference Point Session Dosage Given: 1.8 Gy
Session Number: 6

## 2022-06-25 ENCOUNTER — Ambulatory Visit: Payer: Medicare HMO

## 2022-06-25 ENCOUNTER — Other Ambulatory Visit: Payer: Self-pay

## 2022-06-25 ENCOUNTER — Ambulatory Visit
Admission: RE | Admit: 2022-06-25 | Discharge: 2022-06-25 | Disposition: A | Discharge status: Home / Self Care | Payer: Medicare HMO | Source: Ambulatory Visit | Attending: Radiation Oncology | Admitting: Radiation Oncology

## 2022-06-25 DIAGNOSIS — Z5112 Encounter for antineoplastic immunotherapy: Secondary | ICD-10-CM | POA: Diagnosis not present

## 2022-06-25 LAB — RAD ONC ARIA SESSION SUMMARY
Course Elapsed Days: 8
Plan Fractions Treated to Date: 3
Plan Prescribed Dose Per Fraction: 1.8 Gy
Plan Total Fractions Prescribed: 24
Plan Total Prescribed Dose: 43.2 Gy
Reference Point Dosage Given to Date: 12.6 Gy
Reference Point Session Dosage Given: 1.8 Gy
Session Number: 7

## 2022-06-26 ENCOUNTER — Inpatient Hospital Stay (HOSPITAL_BASED_OUTPATIENT_CLINIC_OR_DEPARTMENT_OTHER): Payer: Medicare HMO | Admitting: Oncology

## 2022-06-26 ENCOUNTER — Encounter: Payer: Self-pay | Admitting: Oncology

## 2022-06-26 ENCOUNTER — Ambulatory Visit
Admission: RE | Admit: 2022-06-26 | Discharge: 2022-06-26 | Disposition: A | Discharge status: Home / Self Care | Payer: Medicare HMO | Source: Ambulatory Visit | Attending: Radiation Oncology | Admitting: Radiation Oncology

## 2022-06-26 ENCOUNTER — Other Ambulatory Visit: Payer: Self-pay

## 2022-06-26 ENCOUNTER — Inpatient Hospital Stay: Payer: Medicare HMO

## 2022-06-26 VITALS — BP 121/66 | HR 77 | Temp 97.2°F | Resp 18 | Wt 213.0 lb

## 2022-06-26 VITALS — BP 112/65 | HR 70 | Temp 98.6°F | Resp 18

## 2022-06-26 DIAGNOSIS — Z171 Estrogen receptor negative status [ER-]: Secondary | ICD-10-CM

## 2022-06-26 DIAGNOSIS — D6481 Anemia due to antineoplastic chemotherapy: Secondary | ICD-10-CM

## 2022-06-26 DIAGNOSIS — G8929 Other chronic pain: Secondary | ICD-10-CM

## 2022-06-26 DIAGNOSIS — M545 Low back pain, unspecified: Secondary | ICD-10-CM

## 2022-06-26 DIAGNOSIS — Z5112 Encounter for antineoplastic immunotherapy: Secondary | ICD-10-CM | POA: Diagnosis not present

## 2022-06-26 DIAGNOSIS — T451X5A Adverse effect of antineoplastic and immunosuppressive drugs, initial encounter: Secondary | ICD-10-CM

## 2022-06-26 DIAGNOSIS — R52 Pain, unspecified: Secondary | ICD-10-CM

## 2022-06-26 DIAGNOSIS — M25511 Pain in right shoulder: Secondary | ICD-10-CM

## 2022-06-26 DIAGNOSIS — C50812 Malignant neoplasm of overlapping sites of left female breast: Secondary | ICD-10-CM

## 2022-06-26 DIAGNOSIS — M549 Dorsalgia, unspecified: Secondary | ICD-10-CM | POA: Insufficient documentation

## 2022-06-26 DIAGNOSIS — E876 Hypokalemia: Secondary | ICD-10-CM

## 2022-06-26 DIAGNOSIS — C50011 Malignant neoplasm of nipple and areola, right female breast: Secondary | ICD-10-CM

## 2022-06-26 DIAGNOSIS — C50911 Malignant neoplasm of unspecified site of right female breast: Secondary | ICD-10-CM | POA: Diagnosis not present

## 2022-06-26 DIAGNOSIS — T7840XA Allergy, unspecified, initial encounter: Secondary | ICD-10-CM

## 2022-06-26 DIAGNOSIS — C801 Malignant (primary) neoplasm, unspecified: Secondary | ICD-10-CM

## 2022-06-26 DIAGNOSIS — Z7689 Persons encountering health services in other specified circumstances: Secondary | ICD-10-CM

## 2022-06-26 DIAGNOSIS — F411 Generalized anxiety disorder: Secondary | ICD-10-CM

## 2022-06-26 LAB — CBC (CANCER CENTER ONLY)
HCT: 29.9 % — ABNORMAL LOW (ref 36.0–46.0)
Hemoglobin: 9.5 g/dL — ABNORMAL LOW (ref 12.0–15.0)
MCH: 26.7 pg (ref 26.0–34.0)
MCHC: 31.8 g/dL (ref 30.0–36.0)
MCV: 84 fL (ref 80.0–100.0)
Platelet Count: 213 10*3/uL (ref 150–400)
RBC: 3.56 MIL/uL — ABNORMAL LOW (ref 3.87–5.11)
RDW: 14.9 % (ref 11.5–15.5)
WBC Count: 9.6 10*3/uL (ref 4.0–10.5)
nRBC: 0 % (ref 0.0–0.2)

## 2022-06-26 LAB — RAD ONC ARIA SESSION SUMMARY
Course Elapsed Days: 9
Plan Fractions Treated to Date: 4
Plan Prescribed Dose Per Fraction: 1.8 Gy
Plan Total Fractions Prescribed: 24
Plan Total Prescribed Dose: 43.2 Gy
Reference Point Dosage Given to Date: 14.4 Gy
Reference Point Session Dosage Given: 1.8 Gy
Session Number: 8

## 2022-06-26 LAB — CMP (CANCER CENTER ONLY)
ALT: 12 U/L (ref 0–44)
AST: 20 U/L (ref 15–41)
Albumin: 3.6 g/dL (ref 3.5–5.0)
Alkaline Phosphatase: 98 U/L (ref 38–126)
Anion gap: 8 (ref 5–15)
BUN: 13 mg/dL (ref 6–20)
CO2: 25 mmol/L (ref 22–32)
Calcium: 8.7 mg/dL — ABNORMAL LOW (ref 8.9–10.3)
Chloride: 103 mmol/L (ref 98–111)
Creatinine: 0.87 mg/dL (ref 0.44–1.00)
GFR, Estimated: >60 mL/min (ref >60)
Glucose, Bld: 103 mg/dL — ABNORMAL HIGH (ref 70–99)
Potassium: 3.8 mmol/L (ref 3.5–5.1)
Sodium: 136 mmol/L (ref 135–145)
Total Bilirubin: 0.2 mg/dL — ABNORMAL LOW (ref 0.3–1.2)
Total Protein: 7.2 g/dL (ref 6.5–8.1)

## 2022-06-26 LAB — MAGNESIUM: Magnesium: 1.6 mg/dL — ABNORMAL LOW (ref 1.7–2.4)

## 2022-06-26 MED ORDER — TRASTUZUMAB-ANNS CHEMO 150 MG IV SOLR
6.0000 mg/kg | Freq: Once | INTRAVENOUS | Status: AC
  Administered 2022-06-26: 600 mg via INTRAVENOUS
  Filled 2022-06-26: qty 28.57

## 2022-06-26 MED ORDER — DIPHENHYDRAMINE HCL 25 MG PO CAPS
50.0000 mg | ORAL_CAPSULE | Freq: Once | ORAL | Status: AC
  Administered 2022-06-26: 50 mg via ORAL
  Filled 2022-06-26: qty 2

## 2022-06-26 MED ORDER — SODIUM CHLORIDE 0.9 % IV SOLN
Freq: Once | INTRAVENOUS | Status: AC
  Filled 2022-06-26: qty 250

## 2022-06-26 MED ORDER — HEPARIN SOD (PORK) LOCK FLUSH 100 UNIT/ML IV SOLN
500.0000 [IU] | Freq: Once | INTRAVENOUS | Status: AC | PRN
  Administered 2022-06-26: 500 [IU]
  Filled 2022-06-26: qty 5

## 2022-06-26 MED ORDER — MAGNESIUM SULFATE 2 GM/50ML IV SOLN
2.0000 g | Freq: Once | INTRAVENOUS | Status: AC
  Administered 2022-06-26: 2 g via INTRAVENOUS
  Filled 2022-06-26: qty 50

## 2022-06-26 MED ORDER — ACETAMINOPHEN 325 MG PO TABS
650.0000 mg | ORAL_TABLET | Freq: Once | ORAL | Status: AC
  Administered 2022-06-26: 650 mg via ORAL
  Filled 2022-06-26: qty 2

## 2022-06-26 MED ORDER — SODIUM CHLORIDE 0.9 % IV SOLN
420.0000 mg | Freq: Once | INTRAVENOUS | Status: AC
  Administered 2022-06-26: 420 mg via INTRAVENOUS
  Filled 2022-06-26: qty 14

## 2022-06-26 MED ORDER — ESCITALOPRAM OXALATE 10 MG PO TABS: 10.0000 mg | ORAL_TABLET | Freq: Every day | ORAL | 1 refills | Status: AC

## 2022-06-26 MED ORDER — SODIUM CHLORIDE 0.9% FLUSH
10.0000 mL | INTRAVENOUS | Status: DC | PRN
  Administered 2022-06-26: 10 mL via INTRAVENOUS
  Filled 2022-06-26: qty 10

## 2022-06-26 NOTE — Assessment & Plan Note (Signed)
IV magnesium PRN if Mag <1.7 Continue Slow Mag 1 tab daily.  

## 2022-06-26 NOTE — Assessment & Plan Note (Signed)
Hemoglobin is stable. Monitor counts. 

## 2022-06-26 NOTE — Assessment & Plan Note (Signed)
continue potassium chloride 20meq twice daily.  

## 2022-06-26 NOTE — Assessment & Plan Note (Addendum)
Left breast cancer, cT4b N3 Mx, ER/PR-, HER2 + S/p neoadjuvant chemotherapy 6 cycles of TCHP  S/p left mastectomy + SLNB -ypT0 ypN0, complete pathological response.  Recommend adjuvant Transtuzumab and Pertuzumab to complete 1 year treatment if she tolerates.   Labs reviewed and discussed with patient.  Proceed with trastuzumab and pertuzumab today.   April 2024 echocardiogram- stable LVEF- repeat in July Follow up with radiation oncology for adjuvant radiation- delayed due to a small wound which was sutured recently.

## 2022-06-26 NOTE — Progress Notes (Signed)
Hematology/Oncology Progress note [phone] [phone]     CHIEF COMPLAINTS/REASON FOR VISIT:  Follow-up for Stage IIIB left breast cancer, ER-, HER2+  ASSESSMENT & PLAN:   Cancer Staging  Breast cancer [facility]) Staging form: Breast, AJCC 8th Edition - Clinical stage from 08/21/2021: Stage IIIB (cT4b, cN3c, cM0, G3, ER-, PR-, HER2+) - Signed by [name], MD on 09/12/2021  Breast cancer (HCC) Left breast cancer, cT4b N3 Mx, ER/PR-, HER2 + S/p neoadjuvant chemotherapy 6 cycles of TCHP  S/p left mastectomy + SLNB -ypT0 ypN0, complete pathological response.  Recommend adjuvant Transtuzumab and Pertuzumab to complete 1 year treatment if she tolerates.   Labs reviewed and discussed with patient.  Proceed with trastuzumab and pertuzumab today.   April 2024 echocardiogram- stable LVEF- repeat in July Follow up with radiation oncology for adjuvant radiation- delayed due to a small wound which was sutured recently.     Anemia due to antineoplastic chemotherapy Hemoglobin is stable. Monitor counts.   Hypomagnesemia IV magnesium PRN if Mag <1.7 Continue Slow Mag 1 tab daily.   Hypokalemia continue potassium chloride twice daily.   Right shoulder pain Right shoulder pain and right upper quadrant pain, intermittent.  She has full range motion of right shoulder, no palpable tenderness of right chest wall/rib cage.  Recommend right shoulder xray, right chest rib xray  Back pain Obtain CXR   Anxiety associated with cancer diagnosis (HCC) Xananx 0.25mg  daily PRN not helpful.  Recommend lexapro 10mg  daily.    Allergy Refer to allergist.    Orders Placed This Encounter  Procedures   CBC (Cancer Center Only)    Standing Status:   Future    Standing Expiration Date:   08/07/2023   CMP (Cancer Center only)    Standing Status:   Future    Standing Expiration Date:   08/07/2023   Magnesium    Standing Status:   Future    Standing Expiration Date:    08/07/2023     All questions were answered. The patient knows to call the clinic with any problems questions or concerns.  Return of visit:  3 weeks lab MD Transtuzumab + Pertuzumab +/- IV magnesium  [name], MD, PhD [facility] Hematology Oncology 06/26/2022      HISTORY OF PRESENTING ILLNESS:  Vanessa Fisher is a  62 y.o.  female with PMH listed below who was referred to me for evaluation of  left breast cancer SUMMARY OF ONCOLOGIC HISTORY: Oncology History  Breast cancer (HCC)  07/31/2021 Imaging   Bilateral diagnostic mammogram and US showed 5 centimeter LEFT breast mass associated with pleomorphic calcifications is suspicious for invasive ductal carcinoma.At least 4 LEFT axillary lymph nodes with abnormal morphology   08/21/2021 Cancer Staging   Staging form: Breast, AJCC 8th Edition - Clinical: Stage IIIB (cT4b, cN3c, cM0, G3, ER-, PR-, HER2+) - Signed by [name], MD on 08/28/2021 Histologic grading system: 3 grade system  -08/21/21 left breast ultrasound-guided biopsy showed invasive mammary carcinoma, grade 3, ER/PR negative, HER2 positive.  Left axillary lymph node biopsy positive for macro metastatic mammary carcinoma, 8 mm in greatest extent.   08/30/2021 Imaging   MRI brain w wo contrast -No metastatic disease or acute intracranial abnormality. Essentially normal for age MRI appearance of the brain.    09/03/2021 Echocardiogram   1. Left ventricular ejection fraction, by estimation, is 55 to 60%. Left ventricular ejection fraction by 3D volume is 58 %. The left ventricle has normal function. The left ventricle  has no regional wall motion abnormalities. There is mild left  ventricular hypertrophy. Left ventricular diastolic parameters were normal.  2. Right ventricular systolic function is normal. The right ventricular size is normal.  3. The mitral valve is normal in structure. No evidence of mitral valve regurgitation.  4. The aortic valve was not well visualized.  Aortic valve regurgitation is not visualized   09/10/2021 Imaging   PET scan showed Large hypermetabolic left breast mass and diffuse skin thickening,consistent with primary breast carcinoma. Hypermetabolic lymphadenopathy in the left axilla, left subpectoral region, and left supraclavicular region, consistent with metastatic disease. No evidence of metastatic disease within the abdomen or pelvis   09/12/2021 - 02/13/2022 Chemotherapy   Patient is on Treatment Plan : BREAST  Docetaxel + Carboplatin + Trastuzumab + Pertuzumab  (TCHP) q21d       Genetic Testing   Negative genetic testing. No pathogenic variants identified on the [facility] Common Hereditary Cancers+RNA panel. The report date is 10/26/2021.  The Common Hereditary Cancers Panel + RNA offered by [facility] includes sequencing and/or deletion duplication testing of the following 47 genes: APC, ATM, AXIN2, BARD1, BMPR1A, BRCA1, BRCA2, BRIP1, CDH1, CDKN2A (p14ARF), CDKN2A (p16INK4a), CKD4, CHEK2, CTNNA1, DICER1, EPCAM (Deletion/duplication testing only), GREM1 (promoter region deletion/duplication testing only), KIT, MEN1, MLH1, MSH2, MSH3, MSH6, MUTYH, NBN, NF1, NHTL1, PALB2, PDGFRA, PMS2, POLD1, POLE, PTEN, RAD50, RAD51C, RAD51D, SDHB, SDHC, SDHD, SMAD4, SMARCA4. STK11, TP53, TSC1, TSC2, and VHL.  The following genes were evaluated for sequence changes only: SDHA and HOXB13 c.251G>A variant only.   02/09/2022 Echocardiogram   LVEF 55 to 60%    03/27/2022 Surgery   S/p left mastectomy + SLNB ypT0 ypN0,    04/24/2022 -  Chemotherapy   Patient is on Treatment Plan : BREAST Trastuzumab  + Pertuzumab q21d x 13 cycles     05/06/2022 Echocardiogram   LVEF 55 to 60%      INTERVAL HISTORY Vanessa Fisher is a 62 y.o. female who has above history reviewed by me today presents for follow up visit for Triple positive breast cancer treatment.   She denies any diarrhea episodes since last visit.. Multiple complaints today.  Right shoulder and  right upper quadrant pain, intermittently, triggered when she bend down.  Back pain mid to lower back.  Feels anxious and depressed. Xanax is not helpful.  She no longer takes Trazodone for insomnia. Sleeps well + chronic nasal pressure, congestion, horse voice she attributes to allergy. She takes Zyrtec with no improvement.    MEDICAL HISTORY:  Past Medical History:  Diagnosis Date   Anemia    Anxiety    Arthritis    Asthma    WELL CONTROLLED   Bile acid malabsorption syndrome    Bradycardia    HAD AN ISSUE WITH THIS IN 2016-NO PROBLEMS SINCE   Breast cancer, left breast (HCC) 08/2021   Carpal tunnel syndrome on right    Depression    Diabetes mellitus without complication (HCC)    Diverticulitis    Gastric reflux    GERD (gastroesophageal reflux disease)    Hand pain 05/12/2016   Headache    H/O MIGRAINES   History of kidney stones    H/O   Lumbar radiculitis    Neck pain, chronic    Osteoarthritis of both knees    Panic attacks     SURGICAL HISTORY: Past Surgical History:  Procedure Laterality Date   BREAST BIOPSY Left 08/21/2021   Axilla Bx, Hydromarker, neg   BREAST BIOPSY  Left 08/21/2021   [place] Bx, Ribbon Clip, invasive mammary carcinoma   BREAST BIOPSY Left 03/12/2022   [place] LT RADIO FREQUENCY TAG LOC US GUIDE 03/12/2022 [facility]   BREAST BIOPSY Left 03/12/2022   [place] LT RADIO FREQUENCY TAG EA ADD LESION LOC US GUIDE 03/12/2022 [facility]   CARPAL TUNNEL RELEASE Right 12/06/2017   Procedure: CARPAL TUNNEL RELEASE;  Surgeon: Deeann Saint, MD;  Location: [facility];  Service: Orthopedics;  Laterality: Right;   COLONOSCOPY WITH PROPOFOL N/A 06/11/2021   Procedure: COLONOSCOPY WITH PROPOFOL;  Surgeon: Toney Reil, MD;  Location: [facility] ENDOSCOPY;  Service: Gastroenterology;  Laterality: N/A;   DORSAL COMPARTMENT RELEASE Right 12/06/2017   Procedure: RELEASE DORSAL COMPARTMENT (DEQUERVAIN);  Surgeon: Deeann Saint, MD;  Location: [facility] ORS;  Service:  Orthopedics;  Laterality: Right;   ESOPHAGOGASTRODUODENOSCOPY (EGD) WITH PROPOFOL N/A 06/11/2021   Procedure: ESOPHAGOGASTRODUODENOSCOPY (EGD) WITH PROPOFOL;  Surgeon: Toney Reil, MD;  Location: [facility] ENDOSCOPY;  Service: Gastroenterology;  Laterality: N/A;   PART MASTECTOMY,RADIO FREQUENCY LOCALIZER,AXILLARY SENTINEL NODE BIOPSY Left 03/27/2022   Procedure: PART MASTECTOMY,RADIO FREQUENCY LOCALIZER,AXILLARY SENTINEL NODE BIOPSY;  Surgeon: Carolan Shiver, MD;  Location: [facility] ORS;  Service: General;  Laterality: Left;   PARTIAL HYSTERECTOMY     PORTACATH PLACEMENT N/A 09/24/2021   Procedure: INSERTION PORT-A-CATH;  Surgeon: Carolan Shiver, MD;  Location: [facility] ORS;  Service: General;  Laterality: N/A;   TUBAL LIGATION      SOCIAL HISTORY: Social History   Socioeconomic History   Marital status: Single    Spouse name: Not on file   Number of children: Not on file   Years of education: Not on file   Highest education level: Not on file  Occupational History   Not on file  Tobacco Use   Smoking status: Former    Types: Cigarettes   Smokeless tobacco: Never  Vaping Use   Vaping Use: Never used  Substance and Sexual Activity   Alcohol use: No   Drug use: No   Sexual activity: Not on file  Other Topics Concern   Not on file  Social History Narrative   Lives alone   Social Determinants of Health   Financial Resource Strain: High Risk (08/15/2021)   Overall Financial Resource Strain (CARDIA)    Difficulty of Paying Living Expenses: Very hard  Food Insecurity: Food Insecurity Present (09/09/2021)   Hunger Vital Sign    Worried About Running Out of Food in the Last Year: Often true    Ran Out of Food in the Last Year: Often true  Transportation Needs: No Transportation Needs (08/15/2021)   PRAPARE - Administrator, Civil Service (Medical): No    Lack of Transportation (Non-Medical): No  Physical Activity: Inactive (08/15/2021)   Exercise Vital Sign     Days of Exercise per Week: 0 days    Minutes of Exercise per Session: 0 min  Stress: Stress Concern Present (08/15/2021)   [employer] of Occupational Health - Occupational Stress Questionnaire    Feeling of Stress : Rather much  Social Connections: Socially Isolated (08/15/2021)   Social Connection and Isolation Panel [NHANES]    Frequency of Communication with Friends and Family: Once a week    Frequency of Social Gatherings with Friends and Family: Once a week    Attends Religious Services: Never    Database administrator or Organizations: No    Attends Engineer, structural: Not on file    Marital Status: Never married  Intimate Partner Violence:  Not At Risk (08/15/2021)   Humiliation, Afraid, Rape, and Kick questionnaire    Fear of Current or Ex-Partner: No    Emotionally Abused: No    Physically Abused: No    Sexually Abused: No    FAMILY HISTORY: Family History  Problem Relation Age of Onset   Diabetes Mother    Hypertension Mother    Heart failure Mother    Pancreatic cancer Mother 60   Diabetes Father    Hypertension Father    Breast cancer Maternal Aunt    Cervical cancer Maternal Aunt    Lung cancer Son 43    ALLERGIES:  is allergic to bc powder [aspirin-salicylamide-caffeine], doxycycline, gabapentin, hydrocodone-acetaminophen, latex, penicillin g, penicillins, and shellfish allergy.  MEDICATIONS:  Current Outpatient Medications  Medication Sig Dispense Refill   albuterol (PROVENTIL) (2.5 MG/3ML) 0.083% nebulizer solution Take 3 mLs (2.5 mg total) by nebulization every 4 (four) hours as needed for wheezing or shortness of breath. 75 mL 2   albuterol (VENTOLIN HFA) 108 (90 Base) MCG/ACT inhaler Inhale 2 puffs into the lungs every 6 (six) hours as needed for wheezing or shortness of breath. 18 g 0   atorvastatin (LIPITOR) 10 MG tablet Take 10 mg by mouth daily.     Azelastine HCl 137 MCG/SPRAY SOLN Place 1 spray into the nose daily. 30 mL 0   calcium  carbonate (OS-CAL - DOSED IN MG OF ELEMENTAL CALCIUM) 1250 (500 Ca) MG tablet Take 1 tablet (1,250 mg total) by mouth daily. 90 tablet 1   cetirizine (ZYRTEC ALLERGY) 10 MG tablet Take 1 tablet (10 mg total) by mouth daily. 90 tablet 0   diphenoxylate-atropine (LOMOTIL) 2.5-0.025 MG tablet Take 1 tablet by mouth 4 (four) times daily as needed for diarrhea or loose stools. 120 tablet 0   esomeprazole (NEXIUM) 40 MG capsule Take 1 capsule (40 mg total) by mouth daily at 12 noon. 30 capsule 3   FLOVENT DISKUS 250 MCG/ACT AEPB Inhale 1 puff into the lungs daily.     fluticasone (FLONASE) 50 MCG/ACT nasal spray Place 2 sprays into both nostrils daily. 15.8 mL 0   Iron-Vitamin C 65-125 MG TABS Take 1 tablet by mouth daily at 2 PM. 30 tablet 2   KLOR-CON M20 20 MEQ tablet TAKE 1 TABLET BY MOUTH TWICE A DAY 60 tablet 1   lidocaine-prilocaine (EMLA) cream Apply 1 Application topically as needed. 30 g 12   magic mouthwash (multi-ingredient) oral suspension Swish and spit 5-10 ml by mouth 4 times a day as needed 400 mL 1   magnesium chloride (SLOW-MAG) 64 MG TBEC SR tablet Take 1 tablet (64 mg total) by mouth daily. 30 tablet 3   montelukast (SINGULAIR) 10 MG tablet Take by mouth.     ondansetron (ZOFRAN) 8 MG tablet Take 1 tablet (8 mg total) by mouth 2 (two) times daily as needed (Nausea or vomiting). Start on the third day after chemotherapy. 30 tablet 1   oxyCODONE-acetaminophen (PERCOCET/ROXICET) 5-325 MG tablet Take 1 tablet by mouth every 6 (six) hours as needed for pain.     prochlorperazine (COMPAZINE) 10 MG tablet Take 1 tablet (10 mg total) by mouth every 6 (six) hours as needed (Nausea or vomiting). 30 tablet 1   tamsulosin (FLOMAX) 0.4 MG CAPS capsule Take 0.4 mg by mouth daily.     traZODone (DESYREL) 50 MG tablet Take 1 tablet (50 mg total) by mouth at bedtime as needed for sleep. 90 tablet 1   zinc gluconate 50 MG  tablet Take 50 mg by mouth as needed.     naloxone (NARCAN) nasal spray 4 mg/0.1  mL  (Patient not taking: Reported on 12/17/2021)     No current facility-administered medications for this visit.   Facility-Administered Medications Ordered in Other Visits  Medication Dose Route Frequency Provider Last Rate Last Admin   magnesium sulfate IVPB 2 g 50 mL  2 g Intravenous Once C[place]ton, Sarah M, PA-C        Review of Systems  Constitutional:  Negative for appetite change, chills, fatigue and fever.  HENT:   Negative for hearing loss and voice change.        Nasal congestion, pressure  Eyes:  Negative for eye problems.  Respiratory:  Negative for chest tightness and cough.   Cardiovascular:  Negative for chest pain.  Gastrointestinal:  Negative for abdominal distention, abdominal pain, blood in stool and diarrhea.  Endocrine: Negative for hot flashes.  Genitourinary:  Negative for difficulty urinating and frequency.   Musculoskeletal:  Positive for arthralgias and back pain.  Skin:  Negative for itching and rash.  Neurological:  Negative for extremity weakness and headaches.  Hematological:  Negative for adenopathy.  Psychiatric/Behavioral:  Negative for confusion.      PHYSICAL EXAMINATION: ECOG PERFORMANCE STATUS: 1 - Symptomatic but completely ambulatory Vitals:   06/26/22 0932  BP: 121/66  Pulse: 77  Resp: 18  Temp: (!) 97.2 F (36.2 C)  SpO2: 100%   Filed Weights   06/26/22 0932  Weight: 213 lb (96.6 kg)    Physical Exam Constitutional:      General: She is not in acute distress.    Appearance: She is not diaphoretic.  HENT:     Head: Normocephalic and atraumatic.     Nose: Nose normal.     Mouth/Throat:     Pharynx: No oropharyngeal exudate.  Eyes:     General: No scleral icterus.    Pupils: Pupils are equal, round, and reactive to light.  Cardiovascular:     Rate and Rhythm: Normal rate and regular rhythm.     Heart sounds: No murmur heard. Pulmonary:     Effort: Pulmonary effort is normal. No respiratory distress.     Breath sounds:  No rales.  Chest:     Chest wall: No tenderness.  Abdominal:     General: There is no distension.     Palpations: Abdomen is soft.     Tenderness: There is no abdominal tenderness.  Musculoskeletal:        General: Normal range of motion.     Cervical back: Normal range of motion and neck supple.  Skin:    General: Skin is warm and dry.     Findings: No erythema.  Neurological:     Mental Status: She is alert and oriented to person, place, and time.     Cranial Nerves: No cranial nerve deficit.     Motor: No abnormal muscle tone.     Coordination: Coordination normal.  Psychiatric:        Mood and Affect: Affect normal.     LABORATORY DATA:  I have reviewed the data as listed    Latest Ref Rng & Units 06/26/2022    9:14 AM 06/19/2022    9:50 AM 06/05/2022    9:04 AM  CBC  WBC 4.0 - 10.5 K/uL 9.6  4.8  4.4   Hemoglobin 12.0 - 15.0 g/dL 9.5  9.6  9.2   Hematocrit 36.0 - 46.0 % 29.9  31.3  29.5   Platelets 150 - 400 K/uL 213  265  209       Latest Ref Rng & Units 06/19/2022    9:50 AM 06/05/2022    9:04 AM 05/15/2022    8:31 AM  CMP  Glucose 70 - 99 mg/dL 811  914  782   BUN 6 - 20 mg/dL 18  13  18    Creatinine 0.44 - 1.00 mg/dL 9.56  2.13  0.86   Sodium 135 - 145 mmol/L 139  139  137   Potassium 3.5 - 5.1 mmol/L 3.9  3.7  4.2   Chloride 98 - 111 mmol/L 106  105  106   CO2 22 - 32 mmol/L 25  26  25    Calcium 8.9 - 10.3 mg/dL 9.0  8.5  9.2   Total Protein 6.5 - 8.1 g/dL 7.1  6.9  7.3   Total Bilirubin 0.3 - 1.2 mg/dL 0.2  0.5  0.2   Alkaline Phos 38 - 126 U/L 91  85  85   AST 15 - 41 U/L 17  20  18    ALT 0 - 44 U/L 14  13  11        RADIOGRAPHIC STUDIES: I have personally reviewed the radiological images as listed and agreed with the findings in the report. No results found.

## 2022-06-26 NOTE — Assessment & Plan Note (Signed)
Refer to allergist  

## 2022-06-26 NOTE — Patient Instructions (Signed)
[facility]  Discharge Instructions: Thank you for choosing [facility] to provide your oncology and hematology care.  If you have a lab appointment with the Cancer Center, please go directly to the Cancer Center and check in at the registration area.  Wear comfortable clothing and clothing appropriate for easy access to any Portacath or PICC line.   We strive to give you quality time with your provider. You may need to reschedule your appointment if you arrive late (15 or more minutes).  Arriving late affects you and other patients whose appointments are after yours.  Also, if you miss three or more appointments without notifying the office, you may be dismissed from the clinic at the provider's discretion.      For prescription refill requests, have your pharmacy contact our office and allow 72 hours for refills to be completed.    Today you received the following chemotherapy and/or immunotherapy agents- trastuzumab, pertuzumab      To help prevent nausea and vomiting after your treatment, we encourage you to take your nausea medication as directed.  BELOW ARE SYMPTOMS THAT SHOULD BE REPORTED IMMEDIATELY: *FEVER GREATER THAN 100.4 F (38 C) OR HIGHER *CHILLS OR SWEATING *NAUSEA AND VOMITING THAT IS NOT CONTROLLED WITH YOUR NAUSEA MEDICATION *UNUSUAL SHORTNESS OF BREATH *UNUSUAL BRUISING OR BLEEDING *URINARY PROBLEMS (pain or burning when urinating, or frequent urination) *BOWEL PROBLEMS (unusual diarrhea, constipation, pain near the anus) TENDERNESS IN MOUTH AND THROAT WITH OR WITHOUT PRESENCE OF ULCERS (sore throat, sores in mouth, or a toothache) UNUSUAL RASH, SWELLING OR PAIN  UNUSUAL VAGINAL DISCHARGE OR ITCHING   Items with * indicate a potential emergency and should be followed up as soon as possible or go to the Emergency Department if any problems should occur.  Please show the CHEMOTHERAPY ALERT CARD or IMMUNOTHERAPY ALERT CARD  at check-in to the Emergency Department and triage nurse.  Should you have questions after your visit or need to cancel or reschedule your appointment, please contact [facility]  [phone] and follow the prompts.  Office hours are 8:00 a.m. to 4:30 p.m. Monday - Friday. Please note that voicemails left after 4:00 p.m. may not be returned until the following business day.  We are closed weekends and major holidays. You have access to a nurse at all times for urgent questions. Please call the main number to the clinic [phone] and follow the prompts.  For any non-urgent questions, you may also contact your provider using MyChart. We now offer e-Visits for anyone 18 and older to request care online for non-urgent symptoms. For details visit [email or web address].   Also download the MyChart app! Go to the app store, search "MyChart", open the app, select [place], and log in with your MyChart username and password.    

## 2022-06-26 NOTE — Addendum Note (Signed)
Addended by: Paulita Fujita on: 06/26/2022 10:12 AM   Modules accepted: Orders

## 2022-06-26 NOTE — Assessment & Plan Note (Addendum)
Right shoulder pain and right upper quadrant pain, intermittent.  She has full range motion of right shoulder, no palpable tenderness of right chest wall/rib cage.  Recommend right shoulder xray, right chest rib xray

## 2022-06-26 NOTE — Assessment & Plan Note (Signed)
Obtain CXR 

## 2022-06-26 NOTE — Assessment & Plan Note (Signed)
Xananx 0.25mg  daily PRN not helpful.  Recommend lexapro 10mg  daily.

## 2022-06-30 ENCOUNTER — Ambulatory Visit
Admission: RE | Admit: 2022-06-30 | Discharge: 2022-06-30 | Disposition: A | Discharge status: Home / Self Care | Payer: Medicare HMO | Source: Ambulatory Visit | Attending: Radiation Oncology | Admitting: Radiation Oncology

## 2022-06-30 ENCOUNTER — Other Ambulatory Visit: Payer: Self-pay

## 2022-06-30 ENCOUNTER — Encounter: Payer: Self-pay | Admitting: Licensed Clinical Social Worker

## 2022-06-30 DIAGNOSIS — Z5112 Encounter for antineoplastic immunotherapy: Secondary | ICD-10-CM | POA: Diagnosis not present

## 2022-06-30 LAB — RAD ONC ARIA SESSION SUMMARY
Course Elapsed Days: 13
Plan Fractions Treated to Date: 5
Plan Prescribed Dose Per Fraction: 1.8 Gy
Plan Total Fractions Prescribed: 24
Plan Total Prescribed Dose: 43.2 Gy
Reference Point Dosage Given to Date: 16.2 Gy
Reference Point Session Dosage Given: 1.8 Gy
Session Number: 9

## 2022-06-30 NOTE — Progress Notes (Signed)
[facility] Clinical Social Work  Clinical Social Work was referred by medical provider for assessment of psychosocial needs.  Clinical Social Worker attempted to contact patient by phone  to offer support and assess for needs.  CSW left voicemail with contact information and request for return call.   FA   Dacey Milberger, LCSW  Clinical Social Worker [facility]          

## 2022-06-30 NOTE — Progress Notes (Unsigned)
[facility] CSW Progress Note  Clinical Child psychotherapist contacted patient by phone to discuss financial concerns.  Patient has exhausted all financial assistance from the cancer center and outside resources.  Patient is aware of this and understands the limitations of financial from the cancer center.    Joseph Art, LCSW Clinical Social Worker [facility]    Patient is participating in a Managed Medicaid Plan:  Yes

## 2022-07-01 ENCOUNTER — Other Ambulatory Visit: Payer: Self-pay

## 2022-07-01 ENCOUNTER — Ambulatory Visit
Admission: RE | Admit: 2022-07-01 | Discharge: 2022-07-01 | Disposition: A | Discharge status: Home / Self Care | Payer: Medicare HMO | Source: Ambulatory Visit | Attending: Radiation Oncology | Admitting: Radiation Oncology

## 2022-07-01 ENCOUNTER — Inpatient Hospital Stay: Payer: Medicare HMO

## 2022-07-01 DIAGNOSIS — Z5112 Encounter for antineoplastic immunotherapy: Secondary | ICD-10-CM | POA: Diagnosis not present

## 2022-07-01 DIAGNOSIS — C50012 Malignant neoplasm of nipple and areola, left female breast: Secondary | ICD-10-CM

## 2022-07-01 LAB — RAD ONC ARIA SESSION SUMMARY
Course Elapsed Days: 14
Plan Fractions Treated to Date: 6
Plan Prescribed Dose Per Fraction: 1.8 Gy
Plan Total Fractions Prescribed: 24
Plan Total Prescribed Dose: 43.2 Gy
Reference Point Dosage Given to Date: 18 Gy
Reference Point Session Dosage Given: 1.8 Gy
Session Number: 10

## 2022-07-01 LAB — CBC (CANCER CENTER ONLY)
HCT: 31.7 % — ABNORMAL LOW (ref 36.0–46.0)
Hemoglobin: 10 g/dL — ABNORMAL LOW (ref 12.0–15.0)
MCH: 26.6 pg (ref 26.0–34.0)
MCHC: 31.5 g/dL (ref 30.0–36.0)
MCV: 84.3 fL (ref 80.0–100.0)
Platelet Count: 237 10*3/uL (ref 150–400)
RBC: 3.76 MIL/uL — ABNORMAL LOW (ref 3.87–5.11)
RDW: 14.9 % (ref 11.5–15.5)
WBC Count: 4.9 10*3/uL (ref 4.0–10.5)
nRBC: 0 % (ref 0.0–0.2)

## 2022-07-02 ENCOUNTER — Ambulatory Visit
Admission: RE | Admit: 2022-07-02 | Discharge: 2022-07-02 | Disposition: A | Discharge status: Home / Self Care | Payer: Medicare HMO | Source: Ambulatory Visit | Attending: Radiation Oncology | Admitting: Radiation Oncology

## 2022-07-02 ENCOUNTER — Other Ambulatory Visit: Payer: Self-pay

## 2022-07-02 DIAGNOSIS — Z5112 Encounter for antineoplastic immunotherapy: Secondary | ICD-10-CM | POA: Diagnosis not present

## 2022-07-02 LAB — RAD ONC ARIA SESSION SUMMARY
Course Elapsed Days: 15
Plan Fractions Treated to Date: 7
Plan Prescribed Dose Per Fraction: 1.8 Gy
Plan Total Fractions Prescribed: 24
Plan Total Prescribed Dose: 43.2 Gy
Reference Point Dosage Given to Date: 19.8 Gy
Reference Point Session Dosage Given: 1.8 Gy
Session Number: 11

## 2022-07-03 ENCOUNTER — Ambulatory Visit
Admission: RE | Admit: 2022-07-03 | Discharge: 2022-07-03 | Disposition: A | Discharge status: Home / Self Care | Payer: Medicare HMO | Source: Ambulatory Visit | Attending: Radiation Oncology | Admitting: Radiation Oncology

## 2022-07-03 ENCOUNTER — Other Ambulatory Visit: Payer: Self-pay

## 2022-07-03 DIAGNOSIS — Z5112 Encounter for antineoplastic immunotherapy: Secondary | ICD-10-CM | POA: Diagnosis not present

## 2022-07-03 LAB — RAD ONC ARIA SESSION SUMMARY
Course Elapsed Days: 16
Plan Fractions Treated to Date: 8
Plan Prescribed Dose Per Fraction: 1.8 Gy
Plan Total Fractions Prescribed: 24
Plan Total Prescribed Dose: 43.2 Gy
Reference Point Dosage Given to Date: 21.6 Gy
Reference Point Session Dosage Given: 1.8 Gy
Session Number: 12

## 2022-07-06 ENCOUNTER — Ambulatory Visit: Payer: Medicare HMO

## 2022-07-06 ENCOUNTER — Ambulatory Visit
Admission: RE | Admit: 2022-07-06 | Discharge: 2022-07-06 | Disposition: A | Discharge status: Home / Self Care | Payer: Medicare HMO | Source: Ambulatory Visit | Attending: Radiation Oncology | Admitting: Radiation Oncology

## 2022-07-06 ENCOUNTER — Other Ambulatory Visit: Payer: Self-pay

## 2022-07-06 DIAGNOSIS — Z5112 Encounter for antineoplastic immunotherapy: Secondary | ICD-10-CM | POA: Diagnosis not present

## 2022-07-06 DIAGNOSIS — Z510 Encounter for antineoplastic radiation therapy: Secondary | ICD-10-CM | POA: Diagnosis not present

## 2022-07-06 DIAGNOSIS — C50012 Malignant neoplasm of nipple and areola, left female breast: Secondary | ICD-10-CM | POA: Insufficient documentation

## 2022-07-06 LAB — RAD ONC ARIA SESSION SUMMARY
Course Elapsed Days: 19
Plan Fractions Treated to Date: 9
Plan Prescribed Dose Per Fraction: 1.8 Gy
Plan Total Fractions Prescribed: 24
Plan Total Prescribed Dose: 43.2 Gy
Reference Point Dosage Given to Date: 23.4 Gy
Reference Point Session Dosage Given: 1.8 Gy
Session Number: 13

## 2022-07-07 ENCOUNTER — Other Ambulatory Visit: Payer: Self-pay

## 2022-07-07 ENCOUNTER — Ambulatory Visit
Admission: RE | Admit: 2022-07-07 | Discharge: 2022-07-07 | Disposition: A | Discharge status: Home / Self Care | Payer: Medicare HMO | Source: Ambulatory Visit | Attending: Radiation Oncology | Admitting: Radiation Oncology

## 2022-07-07 DIAGNOSIS — Z510 Encounter for antineoplastic radiation therapy: Secondary | ICD-10-CM | POA: Diagnosis not present

## 2022-07-07 LAB — RAD ONC ARIA SESSION SUMMARY
Course Elapsed Days: 20
Plan Fractions Treated to Date: 10
Plan Prescribed Dose Per Fraction: 1.8 Gy
Plan Total Fractions Prescribed: 24
Plan Total Prescribed Dose: 43.2 Gy
Reference Point Dosage Given to Date: 25.2 Gy
Reference Point Session Dosage Given: 1.8 Gy
Session Number: 14

## 2022-07-08 ENCOUNTER — Ambulatory Visit
Admission: RE | Admit: 2022-07-08 | Discharge: 2022-07-08 | Disposition: A | Discharge status: Home / Self Care | Payer: Medicare HMO | Source: Ambulatory Visit | Attending: Radiation Oncology | Admitting: Radiation Oncology

## 2022-07-08 ENCOUNTER — Other Ambulatory Visit: Payer: Self-pay

## 2022-07-08 DIAGNOSIS — Z510 Encounter for antineoplastic radiation therapy: Secondary | ICD-10-CM | POA: Diagnosis not present

## 2022-07-08 LAB — RAD ONC ARIA SESSION SUMMARY
Course Elapsed Days: 21
Plan Fractions Treated to Date: 11
Plan Prescribed Dose Per Fraction: 1.8 Gy
Plan Total Fractions Prescribed: 24
Plan Total Prescribed Dose: 43.2 Gy
Reference Point Dosage Given to Date: 27 Gy
Reference Point Session Dosage Given: 1.8 Gy
Session Number: 15

## 2022-07-09 ENCOUNTER — Other Ambulatory Visit: Payer: Self-pay

## 2022-07-09 ENCOUNTER — Ambulatory Visit
Admission: RE | Admit: 2022-07-09 | Discharge: 2022-07-09 | Disposition: A | Discharge status: Home / Self Care | Payer: Medicare HMO | Source: Ambulatory Visit | Attending: Radiation Oncology | Admitting: Radiation Oncology

## 2022-07-09 DIAGNOSIS — Z510 Encounter for antineoplastic radiation therapy: Secondary | ICD-10-CM | POA: Diagnosis not present

## 2022-07-09 LAB — RAD ONC ARIA SESSION SUMMARY
Course Elapsed Days: 22
Plan Fractions Treated to Date: 12
Plan Prescribed Dose Per Fraction: 1.8 Gy
Plan Total Fractions Prescribed: 24
Plan Total Prescribed Dose: 43.2 Gy
Reference Point Dosage Given to Date: 28.8 Gy
Reference Point Session Dosage Given: 1.8 Gy
Session Number: 16

## 2022-07-10 ENCOUNTER — Other Ambulatory Visit: Payer: Self-pay

## 2022-07-10 ENCOUNTER — Ambulatory Visit
Admission: RE | Admit: 2022-07-10 | Discharge: 2022-07-10 | Disposition: A | Discharge status: Home / Self Care | Payer: Medicare HMO | Source: Ambulatory Visit | Attending: Radiation Oncology | Admitting: Radiation Oncology

## 2022-07-10 ENCOUNTER — Ambulatory Visit: Payer: Medicare HMO

## 2022-07-10 DIAGNOSIS — Z510 Encounter for antineoplastic radiation therapy: Secondary | ICD-10-CM | POA: Diagnosis not present

## 2022-07-10 LAB — RAD ONC ARIA SESSION SUMMARY
Course Elapsed Days: 23
Plan Fractions Treated to Date: 13
Plan Prescribed Dose Per Fraction: 1.8 Gy
Plan Total Fractions Prescribed: 24
Plan Total Prescribed Dose: 43.2 Gy
Reference Point Dosage Given to Date: 30.6 Gy
Reference Point Session Dosage Given: 1.8 Gy
Session Number: 17

## 2022-07-13 ENCOUNTER — Other Ambulatory Visit: Payer: Self-pay

## 2022-07-13 ENCOUNTER — Ambulatory Visit
Admission: RE | Admit: 2022-07-13 | Discharge: 2022-07-13 | Disposition: A | Discharge status: Home / Self Care | Payer: Medicare HMO | Source: Ambulatory Visit | Attending: Radiation Oncology | Admitting: Radiation Oncology

## 2022-07-13 DIAGNOSIS — Z510 Encounter for antineoplastic radiation therapy: Secondary | ICD-10-CM | POA: Diagnosis not present

## 2022-07-13 LAB — RAD ONC ARIA SESSION SUMMARY
Course Elapsed Days: 26
Plan Fractions Treated to Date: 14
Plan Prescribed Dose Per Fraction: 1.8 Gy
Plan Total Fractions Prescribed: 24
Plan Total Prescribed Dose: 43.2 Gy
Reference Point Dosage Given to Date: 32.4 Gy
Reference Point Session Dosage Given: 1.8 Gy
Session Number: 18

## 2022-07-14 ENCOUNTER — Ambulatory Visit
Admission: RE | Admit: 2022-07-14 | Discharge: 2022-07-14 | Disposition: A | Discharge status: Home / Self Care | Payer: Medicare HMO | Source: Ambulatory Visit | Attending: Radiation Oncology | Admitting: Radiation Oncology

## 2022-07-14 ENCOUNTER — Other Ambulatory Visit: Payer: Self-pay

## 2022-07-14 DIAGNOSIS — Z510 Encounter for antineoplastic radiation therapy: Secondary | ICD-10-CM | POA: Diagnosis not present

## 2022-07-14 LAB — RAD ONC ARIA SESSION SUMMARY
Course Elapsed Days: 27
Plan Fractions Treated to Date: 15
Plan Prescribed Dose Per Fraction: 1.8 Gy
Plan Total Fractions Prescribed: 24
Plan Total Prescribed Dose: 43.2 Gy
Reference Point Dosage Given to Date: 34.2 Gy
Reference Point Session Dosage Given: 1.8 Gy
Session Number: 19

## 2022-07-15 ENCOUNTER — Encounter: Payer: Self-pay | Admitting: *Deleted

## 2022-07-15 ENCOUNTER — Ambulatory Visit
Admission: RE | Admit: 2022-07-15 | Discharge: 2022-07-15 | Disposition: A | Discharge status: Home / Self Care | Payer: Medicare HMO | Source: Ambulatory Visit | Attending: Radiation Oncology | Admitting: Radiation Oncology

## 2022-07-15 ENCOUNTER — Other Ambulatory Visit: Payer: Self-pay

## 2022-07-15 DIAGNOSIS — Z510 Encounter for antineoplastic radiation therapy: Secondary | ICD-10-CM | POA: Diagnosis not present

## 2022-07-15 LAB — RAD ONC ARIA SESSION SUMMARY
Course Elapsed Days: 28
Plan Fractions Treated to Date: 16
Plan Prescribed Dose Per Fraction: 1.8 Gy
Plan Total Fractions Prescribed: 24
Plan Total Prescribed Dose: 43.2 Gy
Reference Point Dosage Given to Date: 36 Gy
Reference Point Session Dosage Given: 1.8 Gy
Session Number: 20

## 2022-07-16 ENCOUNTER — Other Ambulatory Visit: Payer: Self-pay

## 2022-07-16 ENCOUNTER — Ambulatory Visit
Admission: RE | Admit: 2022-07-16 | Discharge: 2022-07-16 | Disposition: A | Discharge status: Home / Self Care | Payer: Medicare HMO | Source: Ambulatory Visit | Attending: Radiation Oncology | Admitting: Radiation Oncology

## 2022-07-16 DIAGNOSIS — Z510 Encounter for antineoplastic radiation therapy: Secondary | ICD-10-CM | POA: Diagnosis not present

## 2022-07-16 LAB — RAD ONC ARIA SESSION SUMMARY
Course Elapsed Days: 29
Plan Fractions Treated to Date: 17
Plan Prescribed Dose Per Fraction: 1.8 Gy
Plan Total Fractions Prescribed: 24
Plan Total Prescribed Dose: 43.2 Gy
Reference Point Dosage Given to Date: 37.8 Gy
Reference Point Session Dosage Given: 1.8 Gy
Session Number: 21

## 2022-07-17 ENCOUNTER — Inpatient Hospital Stay: Payer: Medicare HMO

## 2022-07-17 ENCOUNTER — Inpatient Hospital Stay (HOSPITAL_BASED_OUTPATIENT_CLINIC_OR_DEPARTMENT_OTHER): Payer: Medicare HMO | Admitting: Oncology

## 2022-07-17 ENCOUNTER — Ambulatory Visit
Admission: RE | Admit: 2022-07-17 | Discharge: 2022-07-17 | Disposition: A | Discharge status: Home / Self Care | Payer: Medicare HMO | Source: Ambulatory Visit | Attending: Radiation Oncology | Admitting: Radiation Oncology

## 2022-07-17 ENCOUNTER — Other Ambulatory Visit: Payer: Self-pay

## 2022-07-17 ENCOUNTER — Ambulatory Visit: Payer: Medicare HMO

## 2022-07-17 ENCOUNTER — Encounter: Payer: Self-pay | Admitting: Oncology

## 2022-07-17 VITALS — BP 121/52 | HR 57 | Temp 96.3°F | Resp 18 | Wt 215.3 lb

## 2022-07-17 VITALS — BP 109/61 | HR 60 | Temp 97.8°F | Resp 18

## 2022-07-17 DIAGNOSIS — D6481 Anemia due to antineoplastic chemotherapy: Secondary | ICD-10-CM

## 2022-07-17 DIAGNOSIS — R1011 Right upper quadrant pain: Secondary | ICD-10-CM | POA: Insufficient documentation

## 2022-07-17 DIAGNOSIS — C50812 Malignant neoplasm of overlapping sites of left female breast: Secondary | ICD-10-CM

## 2022-07-17 DIAGNOSIS — Z5112 Encounter for antineoplastic immunotherapy: Secondary | ICD-10-CM | POA: Insufficient documentation

## 2022-07-17 DIAGNOSIS — Z510 Encounter for antineoplastic radiation therapy: Secondary | ICD-10-CM | POA: Diagnosis not present

## 2022-07-17 DIAGNOSIS — E876 Hypokalemia: Secondary | ICD-10-CM

## 2022-07-17 DIAGNOSIS — C50912 Malignant neoplasm of unspecified site of left female breast: Secondary | ICD-10-CM | POA: Insufficient documentation

## 2022-07-17 DIAGNOSIS — F419 Anxiety disorder, unspecified: Secondary | ICD-10-CM | POA: Insufficient documentation

## 2022-07-17 DIAGNOSIS — Z79899 Other long term (current) drug therapy: Secondary | ICD-10-CM

## 2022-07-17 DIAGNOSIS — Z5181 Encounter for therapeutic drug level monitoring: Secondary | ICD-10-CM

## 2022-07-17 DIAGNOSIS — F411 Generalized anxiety disorder: Secondary | ICD-10-CM

## 2022-07-17 DIAGNOSIS — C801 Malignant (primary) neoplasm, unspecified: Secondary | ICD-10-CM

## 2022-07-17 DIAGNOSIS — M25511 Pain in right shoulder: Secondary | ICD-10-CM | POA: Insufficient documentation

## 2022-07-17 DIAGNOSIS — Z171 Estrogen receptor negative status [ER-]: Secondary | ICD-10-CM | POA: Insufficient documentation

## 2022-07-17 DIAGNOSIS — C50911 Malignant neoplasm of unspecified site of right female breast: Secondary | ICD-10-CM

## 2022-07-17 DIAGNOSIS — J329 Chronic sinusitis, unspecified: Secondary | ICD-10-CM | POA: Insufficient documentation

## 2022-07-17 DIAGNOSIS — C773 Secondary and unspecified malignant neoplasm of axilla and upper limb lymph nodes: Secondary | ICD-10-CM | POA: Insufficient documentation

## 2022-07-17 DIAGNOSIS — T451X5A Adverse effect of antineoplastic and immunosuppressive drugs, initial encounter: Secondary | ICD-10-CM

## 2022-07-17 LAB — CBC (CANCER CENTER ONLY)
HCT: 31.6 % — ABNORMAL LOW (ref 36.0–46.0)
Hemoglobin: 9.9 g/dL — ABNORMAL LOW (ref 12.0–15.0)
MCH: 26.3 pg (ref 26.0–34.0)
MCHC: 31.3 g/dL (ref 30.0–36.0)
MCV: 83.8 fL (ref 80.0–100.0)
Platelet Count: 223 10*3/uL (ref 150–400)
RBC: 3.77 MIL/uL — ABNORMAL LOW (ref 3.87–5.11)
RDW: 15.5 % (ref 11.5–15.5)
WBC Count: 3.7 10*3/uL — ABNORMAL LOW (ref 4.0–10.5)
nRBC: 0 % (ref 0.0–0.2)

## 2022-07-17 LAB — CMP (CANCER CENTER ONLY)
ALT: 13 U/L (ref 0–44)
AST: 17 U/L (ref 15–41)
Albumin: 3.5 g/dL (ref 3.5–5.0)
Alkaline Phosphatase: 92 U/L (ref 38–126)
Anion gap: 7 (ref 5–15)
BUN: 17 mg/dL (ref 6–20)
CO2: 25 mmol/L (ref 22–32)
Calcium: 9 mg/dL (ref 8.9–10.3)
Chloride: 106 mmol/L (ref 98–111)
Creatinine: 0.89 mg/dL (ref 0.44–1.00)
GFR, Estimated: >60 mL/min (ref >60)
Glucose, Bld: 120 mg/dL — ABNORMAL HIGH (ref 70–99)
Potassium: 3.9 mmol/L (ref 3.5–5.1)
Sodium: 138 mmol/L (ref 135–145)
Total Bilirubin: 0.4 mg/dL (ref 0.3–1.2)
Total Protein: 7.2 g/dL (ref 6.5–8.1)

## 2022-07-17 LAB — RAD ONC ARIA SESSION SUMMARY
Course Elapsed Days: 30
Plan Fractions Treated to Date: 18
Plan Prescribed Dose Per Fraction: 1.8 Gy
Plan Total Fractions Prescribed: 24
Plan Total Prescribed Dose: 43.2 Gy
Reference Point Dosage Given to Date: 39.6 Gy
Reference Point Session Dosage Given: 1.8 Gy
Session Number: 22

## 2022-07-17 LAB — MAGNESIUM: Magnesium: 1.5 mg/dL — ABNORMAL LOW (ref 1.7–2.4)

## 2022-07-17 MED ORDER — DIPHENHYDRAMINE HCL 25 MG PO CAPS
50.0000 mg | ORAL_CAPSULE | Freq: Once | ORAL | Status: AC
  Administered 2022-07-17: 50 mg via ORAL
  Filled 2022-07-17: qty 2

## 2022-07-17 MED ORDER — ACETAMINOPHEN 325 MG PO TABS
650.0000 mg | ORAL_TABLET | Freq: Once | ORAL | Status: AC
  Administered 2022-07-17: 650 mg via ORAL
  Filled 2022-07-17: qty 2

## 2022-07-17 MED ORDER — SODIUM CHLORIDE 0.9 % IV SOLN
Freq: Once | INTRAVENOUS | Status: AC
  Filled 2022-07-17: qty 250

## 2022-07-17 MED ORDER — SODIUM CHLORIDE 0.9 % IV SOLN
420.0000 mg | Freq: Once | INTRAVENOUS | Status: AC
  Administered 2022-07-17: 420 mg via INTRAVENOUS
  Filled 2022-07-17: qty 14

## 2022-07-17 MED ORDER — TRASTUZUMAB-ANNS CHEMO 150 MG IV SOLR
6.0000 mg/kg | Freq: Once | INTRAVENOUS | Status: AC
  Administered 2022-07-17: 600 mg via INTRAVENOUS
  Filled 2022-07-17: qty 28.6

## 2022-07-17 MED ORDER — MAGNESIUM SULFATE 2 GM/50ML IV SOLN
2.0000 g | Freq: Once | INTRAVENOUS | Status: AC
  Administered 2022-07-17: 2 g via INTRAVENOUS

## 2022-07-17 MED ORDER — HEPARIN SOD (PORK) LOCK FLUSH 100 UNIT/ML IV SOLN
500.0000 [IU] | Freq: Once | INTRAVENOUS | Status: AC | PRN
  Administered 2022-07-17: 500 [IU]
  Filled 2022-07-17: qty 5

## 2022-07-17 NOTE — Assessment & Plan Note (Signed)
Hemoglobin is stable. Monitor counts. 

## 2022-07-17 NOTE — Assessment & Plan Note (Addendum)
She did not establish care with allergist.

## 2022-07-17 NOTE — Progress Notes (Signed)
Magnesium paused and line flushed prior to starting Kanjinti.  Magnesium restarted post Perjeta.

## 2022-07-17 NOTE — Assessment & Plan Note (Signed)
Recommend flonase nasal spray.  Also azelastine spray Refer to ENT

## 2022-07-17 NOTE — Assessment & Plan Note (Addendum)
Left breast cancer, cT4b N3 Mx, ER/PR-, HER2 + S/p neoadjuvant chemotherapy 6 cycles of TCHP  S/p left mastectomy + SLNB -ypT0 ypN0, complete pathological response.  Recommend adjuvant Transtuzumab and Pertuzumab to complete 1 year treatment if she tolerates.   Labs reviewed and discussed with patient.  Proceed with trastuzumab and pertuzumab today.   April 2024 echocardiogram- stable LVEF- repeat in July Follow up with radiation oncology for adjuvant radiation- last RT 7/1

## 2022-07-17 NOTE — Assessment & Plan Note (Addendum)
Self stopped lexapro 10mg  daily.-  She feels symptoms are better

## 2022-07-17 NOTE — Assessment & Plan Note (Signed)
continue potassium chloride 20meq twice daily.  

## 2022-07-17 NOTE — Assessment & Plan Note (Signed)
IV magnesium PRN if Mag <1.7 Continue Slow Mag 1 tab daily.  

## 2022-07-17 NOTE — Patient Instructions (Signed)
[facility] [place] [facility]  Discharge Instructions: Thank you for choosing [facility] to provide your oncology and hematology care.  If you have a lab appointment with the Cancer Center, please go directly to the Cancer Center and check in at the registration area.  Wear comfortable clothing and clothing appropriate for easy access to any Portacath or PICC line.   We strive to give you quality time with your provider. You may need to reschedule your appointment if you arrive late (15 or more minutes).  Arriving late affects you and other patients whose appointments are after yours.  Also, if you miss three or more appointments without notifying the office, you may be dismissed from the clinic at the provider's discretion.      For prescription refill requests, have your pharmacy contact our office and allow 72 hours for refills to be completed.    Today you received the following chemotherapy and/or immunotherapy agents Kanjinti and Perjeta      To help prevent nausea and vomiting after your treatment, we encourage you to take your nausea medication as directed.  BELOW ARE SYMPTOMS THAT SHOULD BE REPORTED IMMEDIATELY: *FEVER GREATER THAN 100.4 F (38 C) OR HIGHER *CHILLS OR SWEATING *NAUSEA AND VOMITING THAT IS NOT CONTROLLED WITH YOUR NAUSEA MEDICATION *UNUSUAL SHORTNESS OF BREATH *UNUSUAL BRUISING OR BLEEDING *URINARY PROBLEMS (pain or burning when urinating, or frequent urination) *BOWEL PROBLEMS (unusual diarrhea, constipation, pain near the anus) TENDERNESS IN MOUTH AND THROAT WITH OR WITHOUT PRESENCE OF ULCERS (sore throat, sores in mouth, or a toothache) UNUSUAL RASH, SWELLING OR PAIN  UNUSUAL VAGINAL DISCHARGE OR ITCHING   Items with * indicate a potential emergency and should be followed up as soon as possible or go to the Emergency Department if any problems should occur.  Please show the CHEMOTHERAPY ALERT CARD or IMMUNOTHERAPY ALERT CARD at  check-in to the Emergency Department and triage nurse.  Should you have questions after your visit or need to cancel or reschedule your appointment, please contact [facility] [place] [facility]  [phone] and follow the prompts.  Office hours are 8:00 a.m. to 4:30 p.m. Monday - Friday. Please note that voicemails left after 4:00 p.m. may not be returned until the following business day.  We are closed weekends and major holidays. You have access to a nurse at all times for urgent questions. Please call the main number to the clinic [phone] and follow the prompts.  For any non-urgent questions, you may also contact your provider using MyChart. We now offer e-Visits for anyone 18 and older to request care online for non-urgent symptoms. For details visit [email or web address].   Also download the MyChart app! Go to the app store, search "MyChart", open the app, select [facility], and log in with your MyChart username and password.    

## 2022-07-17 NOTE — Assessment & Plan Note (Signed)
Right shoulder pain and right upper quadrant pain, intermittent.  She has full range motion of right shoulder, no palpable tenderness of right chest wall/rib cage.  Recommend right shoulder xray, right chest rib xray 

## 2022-07-17 NOTE — Progress Notes (Signed)
Hematology/Oncology Progress note [phone] [phone]     CHIEF COMPLAINTS/REASON FOR VISIT:  Follow-up for Stage IIIB left breast cancer, ER-, HER2+  ASSESSMENT & PLAN:   Cancer Staging  Breast cancer [facility]) Staging form: Breast, AJCC 8th Edition - Clinical stage from 08/21/2021: Stage IIIB (cT4b, cN3c, cM0, G3, ER-, PR-, HER2+) - Signed by Vanessa Patience, MD on 09/12/2021  Breast cancer (HCC) Left breast cancer, cT4b N3 Mx, ER/PR-, HER2 + S/p neoadjuvant chemotherapy 6 cycles of TCHP  S/p left mastectomy + SLNB -ypT0 ypN0, complete pathological response.  Recommend adjuvant Transtuzumab and Pertuzumab to complete 1 year treatment if she tolerates.   Labs reviewed and discussed with patient.  Proceed with trastuzumab and pertuzumab today.   April 2024 echocardiogram- stable LVEF- repeat in July Follow up with radiation oncology for adjuvant radiation- last RT 7/1   Anemia due to antineoplastic chemotherapy Hemoglobin is stable. Monitor counts.   Hypomagnesemia IV magnesium PRN if Mag <1.7 Continue Slow Mag 1 tab daily.   Hypokalemia continue potassium chloride twice daily.   Anxiety associated with cancer diagnosis (HCC) Self stopped lexapro 10mg  daily.-  She feels symptoms are better   Right shoulder pain Right shoulder pain and right upper quadrant pain, intermittent.  She has full range motion of right shoulder, no palpable tenderness of right chest wall/rib cage.  Recommend right shoulder xray, right chest rib xray  Allergy She did not establish care with allergist.   Chronic sinusitis Recommend flonase nasal spray.  Also azelastine spray Refer to ENT   Orders Placed This Encounter  Procedures   Ambulatory referral to ENT    Referral Priority:   Routine    Referral Type:   Consultation    Referral Reason:   Specialty Services Required    Referred to Provider:   Vernie Murders, MD    Requested Specialty:   Otolaryngology     Number of Visits Requested:   1   ECHOCARDIOGRAM COMPLETE    Standing Status:   Future    Standing Expiration Date:   07/17/2023    Order Specific Question:   Where should this test be performed    Answer:   [facility]    Order Specific Question:   Perflutren DEFINITY (image enhancing agent) should be administered unless hypersensitivity or allergy exist    Answer:   Administer Perflutren    Order Specific Question:   Is a special reader required? (athlete or structural heart)    Answer:   No    Order Specific Question:   Reason for exam-Echo    Answer:   Chemo  Z09     All questions were answered. The patient knows to call the clinic with any problems questions or concerns.  Return of visit:  3 weeks lab MD Transtuzumab + Pertuzumab +/- IV magnesium  Vanessa Patience, MD, PhD [facility] Hematology Oncology 07/17/2022      HISTORY OF PRESENTING ILLNESS:  Vanessa Fisher is a  62 y.o.  female with PMH listed below who was referred to me for evaluation of  left breast cancer SUMMARY OF ONCOLOGIC HISTORY: Oncology History  Breast cancer (HCC)  07/31/2021 Imaging   Bilateral diagnostic mammogram and US showed 5 centimeter LEFT breast mass associated with pleomorphic calcifications is suspicious for invasive ductal carcinoma.At least 4 LEFT axillary lymph nodes with abnormal morphology   08/21/2021 Cancer Staging   Staging form: Breast, AJCC 8th Edition - Clinical: Stage IIIB (cT4b, cN3c, cM0, G3, ER-,  PR-, HER2+) - Signed by Vanessa Patience, MD on 08/28/2021 Histologic grading system: 3 grade system  -08/21/21 left breast ultrasound-guided biopsy showed invasive mammary carcinoma, grade 3, ER/PR negative, HER2 positive.  Left axillary lymph node biopsy positive for macro metastatic mammary carcinoma, 8 mm in greatest extent.   08/30/2021 Imaging   MRI brain w wo contrast -No metastatic disease or acute intracranial abnormality. Essentially normal for age MRI appearance of the  brain.    09/03/2021 Echocardiogram   1. Left ventricular ejection fraction, by estimation, is 55 to 60%. Left ventricular ejection fraction by 3D volume is 58 %. The left ventricle has normal function. The left ventricle has no regional wall motion abnormalities. There is mild left  ventricular hypertrophy. Left ventricular diastolic parameters were normal.  2. Right ventricular systolic function is normal. The right ventricular size is normal.  3. The mitral valve is normal in structure. No evidence of mitral valve regurgitation.  4. The aortic valve was not well visualized. Aortic valve regurgitation is not visualized   09/10/2021 Imaging   PET scan showed Large hypermetabolic left breast mass and diffuse skin thickening,consistent with primary breast carcinoma. Hypermetabolic lymphadenopathy in the left axilla, left subpectoral region, and left supraclavicular region, consistent with metastatic disease. No evidence of metastatic disease within the abdomen or pelvis   09/12/2021 - 02/13/2022 Chemotherapy   Patient is on Treatment Plan : BREAST  Docetaxel + Carboplatin + Trastuzumab + Pertuzumab  (TCHP) q21d       Genetic Testing   Negative genetic testing. No pathogenic variants identified on the [facility] Common Hereditary Cancers+RNA panel. The report date is 10/26/2021.  The Common Hereditary Cancers Panel + RNA offered by [facility] includes sequencing and/or deletion duplication testing of the following 47 genes: APC, ATM, AXIN2, BARD1, BMPR1A, BRCA1, BRCA2, BRIP1, CDH1, CDKN2A (p14ARF), CDKN2A (p16INK4a), CKD4, CHEK2, CTNNA1, DICER1, EPCAM (Deletion/duplication testing only), GREM1 (promoter region deletion/duplication testing only), KIT, MEN1, MLH1, MSH2, MSH3, MSH6, MUTYH, NBN, NF1, NHTL1, PALB2, PDGFRA, PMS2, POLD1, POLE, PTEN, RAD50, RAD51C, RAD51D, SDHB, SDHC, SDHD, SMAD4, SMARCA4. STK11, TP53, TSC1, TSC2, and VHL.  The following genes were evaluated for sequence changes only: SDHA and HOXB13  c.251G>A variant only.   02/09/2022 Echocardiogram   LVEF 55 to 60%    03/27/2022 Surgery   S/p left mastectomy + SLNB ypT0 ypN0,    04/24/2022 -  Chemotherapy   Patient is on Treatment Plan : BREAST Trastuzumab  + Pertuzumab q21d x 13 cycles     05/06/2022 Echocardiogram   LVEF 55 to 60%    07/02/2022 - 08/03/2022 Radiation Therapy   Adjuvant breast radiation.      INTERVAL HISTORY DAYJA QUARTERMAN is a 62 y.o. female who has above history reviewed by me today presents for follow up visit for Triple positive breast cancer treatment.   She denies any diarrhea episodes since last visit.. Right shoulder and right upper quadrant pain, intermittently, triggered when she bend down.  Back pain mid to lower back.  She did not take xrays that were ordered.  Feels anxious and depressed. Xanax is not helpful. She tried Lexapro 10mg , currently not taking as she feels symptoms are better + chronic nasal pressure, congestion, horse voice she attributes to sinusitis. Finished a course of prednisone and antibiotics.    MEDICAL HISTORY:  Past Medical History:  Diagnosis Date   Anemia    Anxiety    Arthritis    Asthma    WELL CONTROLLED   Bile acid malabsorption  syndrome    Bradycardia    HAD AN ISSUE WITH THIS IN 2016-NO PROBLEMS SINCE   Breast cancer, left breast (HCC) 08/2021   Carpal tunnel syndrome on right    Depression    Diabetes mellitus without complication (HCC)    Diverticulitis    Gastric reflux    GERD (gastroesophageal reflux disease)    Hand pain 05/12/2016   Headache    H/O MIGRAINES   History of kidney stones    H/O   Lumbar radiculitis    Neck pain, chronic    Osteoarthritis of both knees    Panic attacks     SURGICAL HISTORY: Past Surgical History:  Procedure Laterality Date   BREAST BIOPSY Left 08/21/2021   Axilla Bx, Hydromarker, neg   BREAST BIOPSY Left 08/21/2021   [place] Bx, Ribbon Clip, invasive mammary carcinoma   BREAST BIOPSY Left 03/12/2022   [place] LT  RADIO FREQUENCY TAG LOC US GUIDE 03/12/2022 [facility]   BREAST BIOPSY Left 03/12/2022   [place] LT RADIO FREQUENCY TAG EA ADD LESION LOC US GUIDE 03/12/2022 [facility]   CARPAL TUNNEL RELEASE Right 12/06/2017   Procedure: CARPAL TUNNEL RELEASE;  Surgeon: Deeann Saint, MD;  Location: [facility];  Service: Orthopedics;  Laterality: Right;   COLONOSCOPY WITH PROPOFOL N/A 06/11/2021   Procedure: COLONOSCOPY WITH PROPOFOL;  Surgeon: Toney Reil, MD;  [facility];  Service: Gastroenterology;  Laterality: N/A;   DORSAL COMPARTMENT RELEASE Right 12/06/2017   Procedure: RELEASE DORSAL COMPARTMENT (DEQUERVAIN);  Surgeon: Deeann Saint, MD;  Location: [facility] ORS;  Service: Orthopedics;  Laterality: Right;   ESOPHAGOGASTRODUODENOSCOPY (EGD) WITH PROPOFOL N/A 06/11/2021   Procedure: ESOPHAGOGASTRODUODENOSCOPY (EGD) WITH PROPOFOL;  Surgeon: Toney Reil, MD;  Location: [facility] ENDOSCOPY;  Service: Gastroenterology;  Laterality: N/A;   PART MASTECTOMY,RADIO FREQUENCY LOCALIZER,AXILLARY SENTINEL NODE BIOPSY Left 03/27/2022   Procedure: PART MASTECTOMY,RADIO FREQUENCY LOCALIZER,AXILLARY SENTINEL NODE BIOPSY;  Surgeon: Carolan Shiver, MD;  Location: [facility];  Service: General;  Laterality: Left;   PARTIAL HYSTERECTOMY     PORTACATH PLACEMENT N/A 09/24/2021   Procedure: INSERTION PORT-A-CATH;  Surgeon: Carolan Shiver, MD;  Location: [facility];  Service: General;  Laterality: N/A;   TUBAL LIGATION      SOCIAL HISTORY: Social History   Socioeconomic History   Marital status: Single    Spouse name: Not on file   Number of children: Not on file   Years of education: Not on file   Highest education level: Not on file  Occupational History   Not on file  Tobacco Use   Smoking status: Former    Types: Cigarettes   Smokeless tobacco: Never  Vaping Use   Vaping Use: Never used  Substance and Sexual Activity   Alcohol use: No   Drug use: No   Sexual activity: Not on  file  Other Topics Concern   Not on file  Social History Narrative   Lives alone   Social Determinants of Health   Financial Resource Strain: High Risk (08/15/2021)   Overall Financial Resource Strain (CARDIA)    Difficulty of Paying Living Expenses: Very hard  Food Insecurity: Food Insecurity Present (09/09/2021)   Hunger Vital Sign    Worried About Running Out of Food in the Last Year: Often true    Ran Out of Food in the Last Year: Often true  Transportation Needs: No Transportation Needs (08/15/2021)   PRAPARE - Administrator, Civil Service (Medical): No    Lack of Transportation (Non-Medical): No  Physical Activity: Inactive (08/15/2021)   Exercise Vital Sign    Days of Exercise per Week: 0 days    Minutes of Exercise per Session: 0 min  Stress: Stress Concern Present (08/15/2021)   [employer] of Occupational Health - Occupational Stress Questionnaire    Feeling of Stress : Rather much  Social Connections: Socially Isolated (08/15/2021)   Social Connection and Isolation Panel [NHANES]    Frequency of Communication with Friends and Family: Once a week    Frequency of Social Gatherings with Friends and Family: Once a week    Attends Religious Services: Never    Database administrator or Organizations: No    Attends Engineer, structural: Not on file    Marital Status: Never married  Intimate Partner Violence: Not At Risk (08/15/2021)   Humiliation, Afraid, Rape, and Kick questionnaire    Fear of Current or Ex-Partner: No    Emotionally Abused: No    Physically Abused: No    Sexually Abused: No    FAMILY HISTORY: Family History  Problem Relation Age of Onset   Diabetes Mother    Hypertension Mother    Heart failure Mother    Pancreatic cancer Mother 84   Diabetes Father    Hypertension Father    Breast cancer Maternal Aunt    Cervical cancer Maternal Aunt    Lung cancer Son 84    ALLERGIES:  is allergic to bc powder  [aspirin-salicylamide-caffeine], doxycycline, gabapentin, hydrocodone-acetaminophen, latex, penicillin g, penicillins, and shellfish allergy.  MEDICATIONS:  Current Outpatient Medications  Medication Sig Dispense Refill   albuterol (PROVENTIL) (2.5 MG/3ML) 0.083% nebulizer solution Take 3 mLs (2.5 mg total) by nebulization every 4 (four) hours as needed for wheezing or shortness of breath. 75 mL 2   albuterol (VENTOLIN HFA) 108 (90 Base) MCG/ACT inhaler Inhale 2 puffs into the lungs every 6 (six) hours as needed for wheezing or shortness of breath. 18 g 0   atorvastatin (LIPITOR) 10 MG tablet Take 10 mg by mouth daily.     Azelastine HCl 137 MCG/SPRAY SOLN Place 1 spray into the nose daily. 30 mL 0   calcium carbonate (OS-CAL - DOSED IN MG OF ELEMENTAL CALCIUM) 1250 (500 Ca) MG tablet Take 1 tablet (1,250 mg total) by mouth daily. 90 tablet 1   cetirizine (ZYRTEC ALLERGY) 10 MG tablet Take 1 tablet (10 mg total) by mouth daily. 90 tablet 0   diphenoxylate-atropine (LOMOTIL) 2.5-0.025 MG tablet Take 1 tablet by mouth 4 (four) times daily as needed for diarrhea or loose stools. 120 tablet 0   escitalopram (LEXAPRO) 10 MG tablet Take 1 tablet (10 mg total) by mouth daily. 30 tablet 1   esomeprazole (NEXIUM) 40 MG capsule Take 1 capsule (40 mg total) by mouth daily at 12 noon. 30 capsule 3   FLOVENT DISKUS 250 MCG/ACT AEPB Inhale 1 puff into the lungs daily.     fluticasone (FLONASE) 50 MCG/ACT nasal spray Place 2 sprays into both nostrils daily. 15.8 mL 0   Iron-Vitamin C 65-125 MG TABS Take 1 tablet by mouth daily at 2 PM. 30 tablet 2   KLOR-CON M20 20 MEQ tablet TAKE 1 TABLET BY MOUTH TWICE A DAY 60 tablet 1   lidocaine-prilocaine (EMLA) cream Apply 1 Application topically as needed. 30 g 12   magic mouthwash (multi-ingredient) oral suspension Swish and spit 5-10 ml by mouth 4 times a day as needed 400 mL 1   magnesium chloride (SLOW-MAG) 64 MG TBEC SR  tablet Take 1 tablet (64 mg total) by mouth  daily. 30 tablet 3   montelukast (SINGULAIR) 10 MG tablet Take by mouth.     ondansetron (ZOFRAN) 8 MG tablet Take 1 tablet (8 mg total) by mouth 2 (two) times daily as needed (Nausea or vomiting). Start on the third day after chemotherapy. 30 tablet 1   oxyCODONE-acetaminophen (PERCOCET/ROXICET) 5-325 MG tablet Take 1 tablet by mouth every 6 (six) hours as needed for pain.     prochlorperazine (COMPAZINE) 10 MG tablet Take 1 tablet (10 mg total) by mouth every 6 (six) hours as needed (Nausea or vomiting). 30 tablet 1   tamsulosin (FLOMAX) 0.4 MG CAPS capsule Take 0.4 mg by mouth daily.     zinc gluconate 50 MG tablet Take 50 mg by mouth as needed.     naloxone (NARCAN) nasal spray 4 mg/0.1 mL  (Patient not taking: Reported on 12/17/2021)     No current facility-administered medications for this visit.   Facility-Administered Medications Ordered in Other Visits  Medication Dose Route Frequency Provider Last Rate Last Admin   magnesium sulfate IVPB 2 g 50 mL  2 g Intravenous Once C[place]ton, Sarah M, PA-C        Review of Systems  Constitutional:  Negative for appetite change, chills, fatigue and fever.  HENT:   Negative for hearing loss and voice change.        Nasal congestion, pressure  Eyes:  Negative for eye problems.  Respiratory:  Negative for chest tightness and cough.   Cardiovascular:  Negative for chest pain.  Gastrointestinal:  Negative for abdominal distention, abdominal pain, blood in stool and diarrhea.  Endocrine: Negative for hot flashes.  Genitourinary:  Negative for difficulty urinating and frequency.   Musculoskeletal:  Positive for arthralgias and back pain.  Skin:  Negative for itching and rash.  Neurological:  Negative for extremity weakness and headaches.  Hematological:  Negative for adenopathy.  Psychiatric/Behavioral:  Negative for confusion.      PHYSICAL EXAMINATION: ECOG PERFORMANCE STATUS: 1 - Symptomatic but completely ambulatory Vitals:   07/17/22  0932  BP: (!) 121/52  Pulse: (!) 57  Resp: 18  Temp: (!) 96.3 F (35.7 C)  SpO2: 100%   Filed Weights   07/17/22 0932  Weight: 215 lb 4.8 oz (97.7 kg)    Physical Exam Constitutional:      General: She is not in acute distress.    Appearance: She is not diaphoretic.  HENT:     Head: Normocephalic and atraumatic.     Nose: Nose normal.     Mouth/Throat:     Pharynx: No oropharyngeal exudate.  Eyes:     General: No scleral icterus.    Pupils: Pupils are equal, round, and reactive to light.  Cardiovascular:     Rate and Rhythm: Normal rate and regular rhythm.     Heart sounds: No murmur heard. Pulmonary:     Effort: Pulmonary effort is normal. No respiratory distress.     Breath sounds: No rales.  Chest:     Chest wall: No tenderness.  Abdominal:     General: There is no distension.     Palpations: Abdomen is soft.     Tenderness: There is no abdominal tenderness.  Musculoskeletal:        General: Normal range of motion.     Cervical back: Normal range of motion and neck supple.  Skin:    General: Skin is warm and dry.  Findings: No erythema.  Neurological:     Mental Status: She is alert and oriented to person, place, and time.     Cranial Nerves: No cranial nerve deficit.     Motor: No abnormal muscle tone.     Coordination: Coordination normal.  Psychiatric:        Mood and Affect: Affect normal.     LABORATORY DATA:  I have reviewed the data as listed    Latest Ref Rng & Units 07/17/2022    9:16 AM 07/01/2022    9:54 AM 06/26/2022    9:14 AM  CBC  WBC 4.0 - 10.5 K/uL 3.7  4.9  9.6   Hemoglobin 12.0 - 15.0 g/dL 9.9  16.1  9.5   Hematocrit 36.0 - 46.0 % 31.6  31.7  29.9   Platelets 150 - 400 K/uL 223  237  213       Latest Ref Rng & Units 07/17/2022    9:16 AM 06/26/2022    9:14 AM 06/19/2022    9:50 AM  CMP  Glucose 70 - 99 mg/dL 096  045  409   BUN 6 - 20 mg/dL 17  13  18    Creatinine 0.44 - 1.00 mg/dL 8.11  9.14  7.82   Sodium 135 - 145 mmol/L  138  136  139   Potassium 3.5 - 5.1 mmol/L 3.9  3.8  3.9   Chloride 98 - 111 mmol/L 106  103  106   CO2 22 - 32 mmol/L 25  25  25    Calcium 8.9 - 10.3 mg/dL 9.0  8.7  9.0   Total Protein 6.5 - 8.1 g/dL 7.2  7.2  7.1   Total Bilirubin 0.3 - 1.2 mg/dL 0.4  0.2  0.2   Alkaline Phos 38 - 126 U/L 92  98  91   AST 15 - 41 U/L 17  20  17    ALT 0 - 44 U/L 13  12  14        RADIOGRAPHIC STUDIES: I have personally reviewed the radiological images as listed and agreed with the findings in the report. No results found.

## 2022-07-20 ENCOUNTER — Ambulatory Visit
Admission: RE | Admit: 2022-07-20 | Discharge: 2022-07-20 | Disposition: A | Discharge status: Home / Self Care | Payer: Medicare HMO | Source: Ambulatory Visit | Attending: Radiation Oncology | Admitting: Radiation Oncology

## 2022-07-20 ENCOUNTER — Other Ambulatory Visit: Payer: Self-pay

## 2022-07-20 ENCOUNTER — Ambulatory Visit: Payer: Medicare HMO

## 2022-07-20 DIAGNOSIS — Z510 Encounter for antineoplastic radiation therapy: Secondary | ICD-10-CM | POA: Diagnosis not present

## 2022-07-20 LAB — RAD ONC ARIA SESSION SUMMARY
Course Elapsed Days: 33
Plan Fractions Treated to Date: 19
Plan Prescribed Dose Per Fraction: 1.8 Gy
Plan Total Fractions Prescribed: 24
Plan Total Prescribed Dose: 43.2 Gy
Reference Point Dosage Given to Date: 41.4 Gy
Reference Point Session Dosage Given: 1.8 Gy
Session Number: 23

## 2022-07-21 ENCOUNTER — Ambulatory Visit
Admission: RE | Admit: 2022-07-21 | Discharge: 2022-07-21 | Disposition: A | Discharge status: Home / Self Care | Payer: Medicare HMO | Source: Ambulatory Visit | Attending: Radiation Oncology | Admitting: Radiation Oncology

## 2022-07-21 ENCOUNTER — Other Ambulatory Visit: Payer: Self-pay

## 2022-07-21 ENCOUNTER — Ambulatory Visit: Payer: Medicare HMO

## 2022-07-21 DIAGNOSIS — Z510 Encounter for antineoplastic radiation therapy: Secondary | ICD-10-CM | POA: Diagnosis not present

## 2022-07-21 LAB — RAD ONC ARIA SESSION SUMMARY
Course Elapsed Days: 34
Plan Fractions Treated to Date: 20
Plan Prescribed Dose Per Fraction: 1.8 Gy
Plan Total Fractions Prescribed: 24
Plan Total Prescribed Dose: 43.2 Gy
Reference Point Dosage Given to Date: 43.2 Gy
Reference Point Session Dosage Given: 1.8 Gy
Session Number: 24

## 2022-07-22 ENCOUNTER — Ambulatory Visit: Payer: Medicare HMO

## 2022-07-22 ENCOUNTER — Other Ambulatory Visit: Payer: Self-pay

## 2022-07-22 ENCOUNTER — Ambulatory Visit
Admission: RE | Admit: 2022-07-22 | Discharge: 2022-07-22 | Disposition: A | Discharge status: Home / Self Care | Payer: Medicare HMO | Source: Ambulatory Visit | Attending: Radiation Oncology | Admitting: Radiation Oncology

## 2022-07-22 DIAGNOSIS — Z510 Encounter for antineoplastic radiation therapy: Secondary | ICD-10-CM | POA: Diagnosis not present

## 2022-07-22 LAB — RAD ONC ARIA SESSION SUMMARY
Course Elapsed Days: 35
Plan Fractions Treated to Date: 21
Plan Prescribed Dose Per Fraction: 1.8 Gy
Plan Total Fractions Prescribed: 24
Plan Total Prescribed Dose: 43.2 Gy
Reference Point Dosage Given to Date: 45 Gy
Reference Point Session Dosage Given: 1.8 Gy
Session Number: 25

## 2022-07-23 ENCOUNTER — Ambulatory Visit: Payer: Medicare HMO

## 2022-07-23 ENCOUNTER — Ambulatory Visit
Admission: RE | Admit: 2022-07-23 | Discharge: 2022-07-23 | Disposition: A | Discharge status: Home / Self Care | Payer: Medicare HMO | Source: Ambulatory Visit | Attending: Radiation Oncology | Admitting: Radiation Oncology

## 2022-07-23 ENCOUNTER — Other Ambulatory Visit: Payer: Self-pay

## 2022-07-23 DIAGNOSIS — Z510 Encounter for antineoplastic radiation therapy: Secondary | ICD-10-CM | POA: Diagnosis not present

## 2022-07-23 LAB — RAD ONC ARIA SESSION SUMMARY
Course Elapsed Days: 36
Plan Fractions Treated to Date: 22
Plan Prescribed Dose Per Fraction: 1.8 Gy
Plan Total Fractions Prescribed: 24
Plan Total Prescribed Dose: 43.2 Gy
Reference Point Dosage Given to Date: 46.8 Gy
Reference Point Session Dosage Given: 1.8 Gy
Session Number: 26

## 2022-07-24 ENCOUNTER — Other Ambulatory Visit: Payer: Self-pay

## 2022-07-24 ENCOUNTER — Ambulatory Visit
Admission: RE | Admit: 2022-07-24 | Discharge: 2022-07-24 | Disposition: A | Discharge status: Home / Self Care | Payer: Medicare HMO | Source: Ambulatory Visit | Attending: Radiation Oncology | Admitting: Radiation Oncology

## 2022-07-24 ENCOUNTER — Ambulatory Visit: Payer: Medicare HMO

## 2022-07-24 DIAGNOSIS — Z510 Encounter for antineoplastic radiation therapy: Secondary | ICD-10-CM | POA: Diagnosis not present

## 2022-07-24 LAB — RAD ONC ARIA SESSION SUMMARY
Course Elapsed Days: 37
Plan Fractions Treated to Date: 23
Plan Prescribed Dose Per Fraction: 1.8 Gy
Plan Total Fractions Prescribed: 24
Plan Total Prescribed Dose: 43.2 Gy
Reference Point Dosage Given to Date: 48.6 Gy
Reference Point Session Dosage Given: 1.8 Gy
Session Number: 27

## 2022-07-27 ENCOUNTER — Ambulatory Visit: Payer: Medicare HMO

## 2022-07-27 ENCOUNTER — Ambulatory Visit
Admission: RE | Admit: 2022-07-27 | Discharge: 2022-07-27 | Disposition: A | Discharge status: Home / Self Care | Payer: Medicare HMO | Source: Ambulatory Visit | Attending: Radiation Oncology | Admitting: Radiation Oncology

## 2022-07-27 ENCOUNTER — Other Ambulatory Visit: Payer: Self-pay

## 2022-07-27 ENCOUNTER — Ambulatory Visit: Admission: RE | Admit: 2022-07-27 | Payer: Medicare HMO | Source: Ambulatory Visit

## 2022-07-27 DIAGNOSIS — Z510 Encounter for antineoplastic radiation therapy: Secondary | ICD-10-CM | POA: Diagnosis not present

## 2022-07-27 LAB — RAD ONC ARIA SESSION SUMMARY
Course Elapsed Days: 40
Plan Fractions Treated to Date: 24
Plan Prescribed Dose Per Fraction: 1.8 Gy
Plan Total Fractions Prescribed: 24
Plan Total Prescribed Dose: 43.2 Gy
Reference Point Dosage Given to Date: 50.4 Gy
Reference Point Session Dosage Given: 1.8 Gy
Session Number: 28

## 2022-07-28 ENCOUNTER — Other Ambulatory Visit: Payer: Self-pay

## 2022-07-28 ENCOUNTER — Other Ambulatory Visit: Payer: Self-pay | Admitting: *Deleted

## 2022-07-28 ENCOUNTER — Ambulatory Visit
Admission: RE | Admit: 2022-07-28 | Discharge: 2022-07-28 | Disposition: A | Discharge status: Home / Self Care | Payer: Medicare HMO | Source: Ambulatory Visit | Attending: Radiation Oncology | Admitting: Radiation Oncology

## 2022-07-28 DIAGNOSIS — Z510 Encounter for antineoplastic radiation therapy: Secondary | ICD-10-CM | POA: Diagnosis not present

## 2022-07-28 LAB — RAD ONC ARIA SESSION SUMMARY
Course Elapsed Days: 41
Plan Fractions Treated to Date: 1
Plan Prescribed Dose Per Fraction: 2 Gy
Plan Total Fractions Prescribed: 5
Plan Total Prescribed Dose: 10 Gy
Reference Point Dosage Given to Date: 2 Gy
Reference Point Session Dosage Given: 2 Gy
Session Number: 29

## 2022-07-28 MED ORDER — SILVER SULFADIAZINE 1 % EX CREA: 1.0000 | TOPICAL_CREAM | Freq: Every day | CUTANEOUS | 1 refills | Status: DC

## 2022-07-29 ENCOUNTER — Ambulatory Visit
Admission: RE | Admit: 2022-07-29 | Discharge: 2022-07-29 | Disposition: A | Discharge status: Home / Self Care | Payer: Medicare HMO | Source: Ambulatory Visit | Attending: Radiation Oncology | Admitting: Radiation Oncology

## 2022-07-29 ENCOUNTER — Other Ambulatory Visit: Payer: Self-pay

## 2022-07-29 DIAGNOSIS — Z510 Encounter for antineoplastic radiation therapy: Secondary | ICD-10-CM | POA: Diagnosis not present

## 2022-07-29 LAB — RAD ONC ARIA SESSION SUMMARY
Course Elapsed Days: 42
Plan Fractions Treated to Date: 2
Plan Prescribed Dose Per Fraction: 2 Gy
Plan Total Fractions Prescribed: 5
Plan Total Prescribed Dose: 10 Gy
Reference Point Dosage Given to Date: 4 Gy
Reference Point Session Dosage Given: 2 Gy
Session Number: 30

## 2022-07-30 ENCOUNTER — Ambulatory Visit
Admission: RE | Admit: 2022-07-30 | Discharge: 2022-07-30 | Disposition: A | Discharge status: Home / Self Care | Payer: Medicare HMO | Source: Ambulatory Visit | Attending: Radiation Oncology | Admitting: Radiation Oncology

## 2022-07-30 ENCOUNTER — Other Ambulatory Visit: Payer: Self-pay

## 2022-07-30 DIAGNOSIS — Z510 Encounter for antineoplastic radiation therapy: Secondary | ICD-10-CM | POA: Diagnosis not present

## 2022-07-30 LAB — RAD ONC ARIA SESSION SUMMARY
Course Elapsed Days: 43
Plan Fractions Treated to Date: 3
Plan Prescribed Dose Per Fraction: 2 Gy
Plan Total Fractions Prescribed: 5
Plan Total Prescribed Dose: 10 Gy
Reference Point Dosage Given to Date: 6 Gy
Reference Point Session Dosage Given: 2 Gy
Session Number: 31

## 2022-07-31 ENCOUNTER — Other Ambulatory Visit: Payer: Self-pay

## 2022-07-31 ENCOUNTER — Other Ambulatory Visit: Payer: Self-pay | Admitting: Oncology

## 2022-07-31 ENCOUNTER — Ambulatory Visit
Admission: RE | Admit: 2022-07-31 | Discharge: 2022-07-31 | Disposition: A | Discharge status: Home / Self Care | Payer: Medicare HMO | Source: Ambulatory Visit | Attending: Radiation Oncology | Admitting: Radiation Oncology

## 2022-07-31 DIAGNOSIS — C50812 Malignant neoplasm of overlapping sites of left female breast: Secondary | ICD-10-CM

## 2022-07-31 DIAGNOSIS — Z510 Encounter for antineoplastic radiation therapy: Secondary | ICD-10-CM | POA: Diagnosis not present

## 2022-07-31 LAB — RAD ONC ARIA SESSION SUMMARY
Course Elapsed Days: 44
Plan Fractions Treated to Date: 4
Plan Prescribed Dose Per Fraction: 2 Gy
Plan Total Fractions Prescribed: 5
Plan Total Prescribed Dose: 10 Gy
Reference Point Dosage Given to Date: 8 Gy
Reference Point Session Dosage Given: 2 Gy
Session Number: 32

## 2022-08-03 ENCOUNTER — Ambulatory Visit
Admission: RE | Admit: 2022-08-03 | Discharge: 2022-08-03 | Disposition: A | Discharge status: Home / Self Care | Payer: Medicare HMO | Source: Ambulatory Visit | Attending: Radiation Oncology | Admitting: Radiation Oncology

## 2022-08-03 ENCOUNTER — Encounter: Payer: Self-pay | Admitting: *Deleted

## 2022-08-03 ENCOUNTER — Other Ambulatory Visit: Payer: Self-pay

## 2022-08-03 DIAGNOSIS — C50012 Malignant neoplasm of nipple and areola, left female breast: Secondary | ICD-10-CM | POA: Diagnosis present

## 2022-08-03 DIAGNOSIS — Z171 Estrogen receptor negative status [ER-]: Secondary | ICD-10-CM | POA: Diagnosis present

## 2022-08-03 LAB — RAD ONC ARIA SESSION SUMMARY
Course Elapsed Days: 47
Plan Fractions Treated to Date: 5
Plan Prescribed Dose Per Fraction: 2 Gy
Plan Total Fractions Prescribed: 5
Plan Total Prescribed Dose: 10 Gy
Reference Point Dosage Given to Date: 10 Gy
Reference Point Session Dosage Given: 2 Gy
Session Number: 33

## 2022-08-07 ENCOUNTER — Ambulatory Visit: Payer: Medicare HMO | Admitting: Oncology

## 2022-08-07 ENCOUNTER — Other Ambulatory Visit: Payer: Medicare HMO

## 2022-08-07 ENCOUNTER — Ambulatory Visit: Payer: Medicare HMO

## 2022-08-14 ENCOUNTER — Inpatient Hospital Stay: Payer: Medicare HMO | Attending: Oncology

## 2022-08-14 ENCOUNTER — Inpatient Hospital Stay: Payer: Medicare HMO

## 2022-08-14 ENCOUNTER — Inpatient Hospital Stay (HOSPITAL_BASED_OUTPATIENT_CLINIC_OR_DEPARTMENT_OTHER): Payer: Medicare HMO | Admitting: Oncology

## 2022-08-14 ENCOUNTER — Encounter: Payer: Self-pay | Admitting: Oncology

## 2022-08-14 ENCOUNTER — Other Ambulatory Visit: Payer: Self-pay | Admitting: Oncology

## 2022-08-14 VITALS — BP 109/78 | HR 70 | Temp 97.0°F | Resp 17 | Wt 216.0 lb

## 2022-08-14 DIAGNOSIS — E876 Hypokalemia: Secondary | ICD-10-CM | POA: Insufficient documentation

## 2022-08-14 DIAGNOSIS — Z171 Estrogen receptor negative status [ER-]: Secondary | ICD-10-CM

## 2022-08-14 DIAGNOSIS — T451X5A Adverse effect of antineoplastic and immunosuppressive drugs, initial encounter: Secondary | ICD-10-CM

## 2022-08-14 DIAGNOSIS — F419 Anxiety disorder, unspecified: Secondary | ICD-10-CM | POA: Insufficient documentation

## 2022-08-14 DIAGNOSIS — C50911 Malignant neoplasm of unspecified site of right female breast: Secondary | ICD-10-CM | POA: Diagnosis not present

## 2022-08-14 DIAGNOSIS — C50812 Malignant neoplasm of overlapping sites of left female breast: Secondary | ICD-10-CM | POA: Diagnosis not present

## 2022-08-14 DIAGNOSIS — Z5112 Encounter for antineoplastic immunotherapy: Secondary | ICD-10-CM | POA: Insufficient documentation

## 2022-08-14 DIAGNOSIS — M549 Dorsalgia, unspecified: Secondary | ICD-10-CM | POA: Insufficient documentation

## 2022-08-14 DIAGNOSIS — L589 Radiodermatitis, unspecified: Secondary | ICD-10-CM | POA: Insufficient documentation

## 2022-08-14 DIAGNOSIS — G8929 Other chronic pain: Secondary | ICD-10-CM

## 2022-08-14 DIAGNOSIS — C801 Malignant (primary) neoplasm, unspecified: Secondary | ICD-10-CM

## 2022-08-14 DIAGNOSIS — C50012 Malignant neoplasm of nipple and areola, left female breast: Secondary | ICD-10-CM | POA: Diagnosis present

## 2022-08-14 DIAGNOSIS — M545 Low back pain, unspecified: Secondary | ICD-10-CM

## 2022-08-14 DIAGNOSIS — D6481 Anemia due to antineoplastic chemotherapy: Secondary | ICD-10-CM | POA: Insufficient documentation

## 2022-08-14 DIAGNOSIS — F411 Generalized anxiety disorder: Secondary | ICD-10-CM

## 2022-08-14 LAB — CMP (CANCER CENTER ONLY)
ALT: 14 U/L (ref 0–44)
AST: 18 U/L (ref 15–41)
Albumin: 3.5 g/dL (ref 3.5–5.0)
Alkaline Phosphatase: 97 U/L (ref 38–126)
Anion gap: 6 (ref 5–15)
BUN: 17 mg/dL (ref 6–20)
CO2: 26 mmol/L (ref 22–32)
Calcium: 8.7 mg/dL — ABNORMAL LOW (ref 8.9–10.3)
Chloride: 105 mmol/L (ref 98–111)
Creatinine: 0.83 mg/dL (ref 0.44–1.00)
GFR, Estimated: >60 mL/min (ref >60)
Glucose, Bld: 116 mg/dL — ABNORMAL HIGH (ref 70–99)
Potassium: 3.9 mmol/L (ref 3.5–5.1)
Sodium: 137 mmol/L (ref 135–145)
Total Bilirubin: 0.2 mg/dL — ABNORMAL LOW (ref 0.3–1.2)
Total Protein: 7 g/dL (ref 6.5–8.1)

## 2022-08-14 LAB — CBC (CANCER CENTER ONLY)
HCT: 30.2 % — ABNORMAL LOW (ref 36.0–46.0)
Hemoglobin: 9.5 g/dL — ABNORMAL LOW (ref 12.0–15.0)
MCH: 26.2 pg (ref 26.0–34.0)
MCHC: 31.5 g/dL (ref 30.0–36.0)
MCV: 83.4 fL (ref 80.0–100.0)
Platelet Count: 224 10*3/uL (ref 150–400)
RBC: 3.62 MIL/uL — ABNORMAL LOW (ref 3.87–5.11)
RDW: 15 % (ref 11.5–15.5)
WBC Count: 4.2 10*3/uL (ref 4.0–10.5)
nRBC: 0 % (ref 0.0–0.2)

## 2022-08-14 LAB — MAGNESIUM: Magnesium: 1.7 mg/dL (ref 1.7–2.4)

## 2022-08-14 MED ORDER — SODIUM CHLORIDE 0.9 % IV SOLN
420.0000 mg | Freq: Once | INTRAVENOUS | Status: AC
  Administered 2022-08-14: 420 mg via INTRAVENOUS
  Filled 2022-08-14: qty 14

## 2022-08-14 MED ORDER — ACETAMINOPHEN 325 MG PO TABS
650.0000 mg | ORAL_TABLET | Freq: Once | ORAL | Status: AC
  Administered 2022-08-14: 650 mg via ORAL
  Filled 2022-08-14: qty 2

## 2022-08-14 MED ORDER — DIPHENHYDRAMINE HCL 25 MG PO CAPS
50.0000 mg | ORAL_CAPSULE | Freq: Once | ORAL | Status: AC
  Administered 2022-08-14: 50 mg via ORAL
  Filled 2022-08-14: qty 2

## 2022-08-14 MED ORDER — SODIUM CHLORIDE 0.9 % IV SOLN
Freq: Once | INTRAVENOUS | Status: AC
  Filled 2022-08-14: qty 250

## 2022-08-14 MED ORDER — TRASTUZUMAB-ANNS CHEMO 150 MG IV SOLR
6.0000 mg/kg | Freq: Once | INTRAVENOUS | Status: AC
  Administered 2022-08-14: 600 mg via INTRAVENOUS
  Filled 2022-08-14: qty 28.57

## 2022-08-14 MED ORDER — HEPARIN SOD (PORK) LOCK FLUSH 100 UNIT/ML IV SOLN
500.0000 [IU] | Freq: Once | INTRAVENOUS | Status: DC | PRN
  Filled 2022-08-14: qty 5

## 2022-08-14 NOTE — Progress Notes (Signed)
Hematology/Oncology Progress note [phone] [phone]     CHIEF COMPLAINTS/REASON FOR VISIT:  Follow-up for Stage IIIB left breast cancer, ER-, HER2+  ASSESSMENT & PLAN:   Cancer Staging  Breast cancer [facility]) Staging form: Breast, AJCC 8th Edition - Clinical stage from 08/21/2021: Stage IIIB (cT4b, cN3c, cM0, G3, ER-, PR-, HER2+) - Signed by [name], MD on 09/12/2021  Breast cancer (HCC) Left breast cancer, cT4b N3 Mx, ER/PR-, HER2 + S/p neoadjuvant chemotherapy 6 cycles of TCHP  S/p left mastectomy + SLNB -ypT0 ypN0, complete pathological response.  Recommend adjuvant Transtuzumab and Pertuzumab to complete 1 year treatment if she tolerates.   Labs reviewed and discussed with patient.  Proceed with trastuzumab and pertuzumab today.   April 2024 echocardiogram- stable LVEF- repeat in July Follow up with radiation oncology for adjuvant radiation- last RT 7/1 Recommend adjuvant Zometa in future   Anemia due to antineoplastic chemotherapy Hemoglobin is stable. Monitor counts.   Hypomagnesemia IV magnesium PRN if Mag <1.7 Continue Slow Mag 1 tab daily.   Hypokalemia continue potassium chloride twice daily.   Anxiety associated with cancer diagnosis (HCC) Self stopped lexapro 10mg  daily.-  She feels symptoms are better   Radiation dermatitis Improving. No signs of infection.   Back pain Obtain CXR, ordered but not done.   Hypocalcemia Probably contribute to leg cramps.  Recommend calcium an vitamin D supplementation.   Orders Placed This Encounter  Procedures   DG Bone Density    ASAP    Standing Status:   Future    Standing Expiration Date:   08/14/2023    Order Specific Question:   Reason for Exam (SYMPTOM  OR DIAGNOSIS REQUIRED)    Answer:   [name] cancer    Order Specific Question:   Is patient pregnant?    Answer:   No    Order Specific Question:   Preferred imaging location?    Answer:   [facility]     All  questions were answered. The patient knows to call the clinic with any problems questions or concerns.  Return of visit:  3 weeks lab MD Transtuzumab + Pertuzumab +/- IV magnesium  [name], MD, PhD [facility] Hematology Oncology 08/14/2022      HISTORY OF PRESENTING ILLNESS:  Vanessa Fisher is a  62 y.o.  female with PMH listed below who was referred to me for evaluation of  left breast cancer SUMMARY OF ONCOLOGIC HISTORY: Oncology History  Breast cancer (HCC)  07/31/2021 Imaging   Bilateral diagnostic mammogram and US showed 5 centimeter LEFT breast mass associated with pleomorphic calcifications is suspicious for invasive ductal carcinoma.At least 4 LEFT axillary lymph nodes with abnormal morphology   08/21/2021 Cancer Staging   Staging form: Breast, AJCC 8th Edition - Clinical: Stage IIIB (cT4b, cN3c, cM0, G3, ER-, PR-, HER2+) - Signed by [name], MD on 08/28/2021 Histologic grading system: 3 grade system  -08/21/21 left breast ultrasound-guided biopsy showed invasive mammary carcinoma, grade 3, ER/PR negative, HER2 positive.  Left axillary lymph node biopsy positive for macro metastatic mammary carcinoma, 8 mm in greatest extent.   08/30/2021 Imaging   MRI brain w wo contrast -No metastatic disease or acute intracranial abnormality. Essentially normal for age MRI appearance of the brain.    09/03/2021 Echocardiogram   1. Left ventricular ejection fraction, by estimation, is 55 to 60%. Left ventricular ejection fraction by 3D volume is 58 %. The left ventricle has normal function. The left ventricle has no  regional wall motion abnormalities. There is mild left  ventricular hypertrophy. Left ventricular diastolic parameters were normal.  2. Right ventricular systolic function is normal. The right ventricular size is normal.  3. The mitral valve is normal in structure. No evidence of mitral valve regurgitation.  4. The aortic valve was not well visualized. Aortic valve  regurgitation is not visualized   09/10/2021 Imaging   PET scan showed Large hypermetabolic left breast mass and diffuse skin thickening,consistent with primary breast carcinoma. Hypermetabolic lymphadenopathy in the left axilla, left subpectoral region, and left supraclavicular region, consistent with metastatic disease. No evidence of metastatic disease within the abdomen or pelvis   09/12/2021 - 02/13/2022 Chemotherapy   Patient is on Treatment Plan : BREAST  Docetaxel + Carboplatin + Trastuzumab + Pertuzumab  (TCHP) q21d       Genetic Testing   Negative genetic testing. No pathogenic variants identified on the [facility] Common Hereditary Cancers+RNA panel. The report date is 10/26/2021.  The Common Hereditary Cancers Panel + RNA offered by [facility] includes sequencing and/or deletion duplication testing of the following 47 genes: APC, ATM, AXIN2, BARD1, BMPR1A, BRCA1, BRCA2, BRIP1, CDH1, CDKN2A (p14ARF), CDKN2A (p16INK4a), CKD4, CHEK2, CTNNA1, DICER1, EPCAM (Deletion/duplication testing only), GREM1 (promoter region deletion/duplication testing only), KIT, MEN1, MLH1, MSH2, MSH3, MSH6, MUTYH, NBN, NF1, NHTL1, PALB2, PDGFRA, PMS2, POLD1, POLE, PTEN, RAD50, RAD51C, RAD51D, SDHB, SDHC, SDHD, SMAD4, SMARCA4. STK11, TP53, TSC1, TSC2, and VHL.  The following genes were evaluated for sequence changes only: SDHA and HOXB13 c.251G>A variant only.   02/09/2022 Echocardiogram   LVEF 55 to 60%    03/27/2022 Surgery   S/p left mastectomy + SLNB ypT0 ypN0,    04/24/2022 -  Chemotherapy   Patient is on Treatment Plan : BREAST Trastuzumab  + Pertuzumab q21d x 13 cycles     05/06/2022 Echocardiogram   LVEF 55 to 60%    07/02/2022 - 08/03/2022 Radiation Therapy   Adjuvant breast radiation.     + chronic nasal pressure, congestion, horse voice she attributes to sinusitis.   INTERVAL HISTORY Vanessa Fisher is a 62 y.o. female who has above history reviewed by me today presents for follow up visit for ER  negative HER2 positive breast cancer treatment.   She denies any diarrhea episodes since last visit.. She finished RT, left arm pit skin irritation, improving.  No fever or chills.  + leg cramps.     MEDICAL HISTORY:  Past Medical History:  Diagnosis Date   Anemia    Anxiety    Arthritis    Asthma    WELL CONTROLLED   Bile acid malabsorption syndrome    Bradycardia    HAD AN ISSUE WITH THIS IN 2016-NO PROBLEMS SINCE   Breast cancer, left breast (HCC) 08/2021   Carpal tunnel syndrome on right    Depression    Diabetes mellitus without complication (HCC)    Diverticulitis    Gastric reflux    GERD (gastroesophageal reflux disease)    Hand pain 05/12/2016   Headache    H/O MIGRAINES   History of kidney stones    H/O   Lumbar radiculitis    Neck pain, chronic    Osteoarthritis of both knees    Panic attacks     SURGICAL HISTORY: Past Surgical History:  Procedure Laterality Date   BREAST BIOPSY Left 08/21/2021   Axilla Bx, Hydromarker, neg   BREAST BIOPSY Left 08/21/2021   [place] Bx, Ribbon Clip, invasive mammary carcinoma   BREAST BIOPSY Left  03/12/2022   [place] LT RADIO FREQUENCY TAG LOC US GUIDE 03/12/2022 [facility]   BREAST BIOPSY Left 03/12/2022   [place] LT RADIO FREQUENCY TAG EA ADD LESION LOC US GUIDE 03/12/2022 [facility]   CARPAL TUNNEL RELEASE Right 12/06/2017   Procedure: CARPAL TUNNEL RELEASE;  Surgeon: Deeann Saint, MD;  Location: [facility];  Service: Orthopedics;  Laterality: Right;   COLONOSCOPY WITH PROPOFOL N/A 06/11/2021   Procedure: COLONOSCOPY WITH PROPOFOL;  Surgeon: Toney Reil, MD;  Location: [facility] ENDOSCOPY;  Service: Gastroenterology;  Laterality: N/A;   DORSAL COMPARTMENT RELEASE Right 12/06/2017   Procedure: RELEASE DORSAL COMPARTMENT (DEQUERVAIN);  Surgeon: Deeann Saint, MD;  Location: [facility] ORS;  Service: Orthopedics;  Laterality: Right;   ESOPHAGOGASTRODUODENOSCOPY (EGD) WITH PROPOFOL N/A 06/11/2021   Procedure:  ESOPHAGOGASTRODUODENOSCOPY (EGD) WITH PROPOFOL;  Surgeon: Toney Reil, MD;  Location: [facility] ENDOSCOPY;  Service: Gastroenterology;  Laterality: N/A;   PART MASTECTOMY,RADIO FREQUENCY LOCALIZER,AXILLARY SENTINEL NODE BIOPSY Left 03/27/2022   Procedure: PART MASTECTOMY,RADIO FREQUENCY LOCALIZER,AXILLARY SENTINEL NODE BIOPSY;  Surgeon: Carolan Shiver, MD;  Location: [facility] ORS;  Service: General;  Laterality: Left;   PARTIAL HYSTERECTOMY     PORTACATH PLACEMENT N/A 09/24/2021   Procedure: INSERTION PORT-A-CATH;  Surgeon: Carolan Shiver, MD;  Location: [facility] ORS;  Service: General;  Laterality: N/A;   TUBAL LIGATION      SOCIAL HISTORY: Social History   Socioeconomic History   Marital status: Single    Spouse name: Not on file   Number of children: Not on file   Years of education: Not on file   Highest education level: Not on file  Occupational History   Not on file  Tobacco Use   Smoking status: Former    Types: Cigarettes   Smokeless tobacco: Never  Vaping Use   Vaping status: Never Used  Substance and Sexual Activity   Alcohol use: No   Drug use: No   Sexual activity: Not on file  Other Topics Concern   Not on file  Social History Narrative   Lives alone   Social Determinants of Health   Financial Resource Strain: High Risk (08/15/2021)   Overall Financial Resource Strain (CARDIA)    Difficulty of Paying Living Expenses: Very hard  Food Insecurity: Food Insecurity Present (09/09/2021)   Hunger Vital Sign    Worried About Running Out of Food in the Last Year: Often true    Ran Out of Food in the Last Year: Often true  Transportation Needs: No Transportation Needs (08/15/2021)   PRAPARE - Administrator, Civil Service (Medical): No    Lack of Transportation (Non-Medical): No  Physical Activity: Inactive (08/15/2021)   Exercise Vital Sign    Days of Exercise per Week: 0 days    Minutes of Exercise per Session: 0 min  Stress: Stress Concern  Present (08/15/2021)   [employer] of Occupational Health - Occupational Stress Questionnaire    Feeling of Stress : Rather much  Social Connections: Socially Isolated (08/15/2021)   Social Connection and Isolation Panel [NHANES]    Frequency of Communication with Friends and Family: Once a week    Frequency of Social Gatherings with Friends and Family: Once a week    Attends Religious Services: Never    Database administrator or Organizations: No    Attends Banker Meetings: Not on file    Marital Status: Never married  Intimate Partner Violence: Not At Risk (08/15/2021)   Humiliation, Afraid, Rape, and Kick questionnaire    Fear  of Current or Ex-Partner: No    Emotionally Abused: No    Physically Abused: No    Sexually Abused: No    FAMILY HISTORY: Family History  Problem Relation Age of Onset   Diabetes Mother    Hypertension Mother    Heart failure Mother    Pancreatic cancer Mother 101   Diabetes Father    Hypertension Father    Breast cancer Maternal Aunt    Cervical cancer Maternal Aunt    Lung cancer Son 48    ALLERGIES:  is allergic to bc powder [aspirin-salicylamide-caffeine], doxycycline, gabapentin, hydrocodone-acetaminophen, latex, penicillin g, penicillins, and shellfish allergy.  MEDICATIONS:  Current Outpatient Medications  Medication Sig Dispense Refill   albuterol (PROVENTIL) (2.5 MG/3ML) 0.083% nebulizer solution Take 3 mLs (2.5 mg total) by nebulization every 4 (four) hours as needed for wheezing or shortness of breath. 75 mL 2   albuterol (VENTOLIN HFA) 108 (90 Base) MCG/ACT inhaler Inhale 2 puffs into the lungs every 6 (six) hours as needed for wheezing or shortness of breath. 18 g 0   atorvastatin (LIPITOR) 10 MG tablet Take 10 mg by mouth daily.     Azelastine HCl 137 MCG/SPRAY SOLN Place 1 spray into the nose daily. 30 mL 0   calcium carbonate (OS-CAL - DOSED IN MG OF ELEMENTAL CALCIUM) 1250 (500 Ca) MG tablet Take 1 tablet (1,250 mg  total) by mouth daily. 90 tablet 1   cephALEXin (KEFLEX) 500 MG capsule Take 500 mg by mouth every 12 (twelve) hours.     cetirizine (ZYRTEC ALLERGY) 10 MG tablet Take 1 tablet (10 mg total) by mouth daily. 90 tablet 0   diphenoxylate-atropine (LOMOTIL) 2.5-0.025 MG tablet Take 1 tablet by mouth 4 (four) times daily as needed for diarrhea or loose stools. 120 tablet 0   escitalopram (LEXAPRO) 10 MG tablet Take 1 tablet (10 mg total) by mouth daily. 30 tablet 1   esomeprazole (NEXIUM) 40 MG capsule Take 1 capsule (40 mg total) by mouth daily at 12 noon. 30 capsule 3   FLOVENT DISKUS 250 MCG/ACT AEPB Inhale 1 puff into the lungs daily.     fluticasone (FLONASE) 50 MCG/ACT nasal spray Place 2 sprays into both nostrils daily. 15.8 mL 0   Iron-Vitamin C 65-125 MG TABS Take 1 tablet by mouth daily at 2 PM. 30 tablet 2   KLOR-CON M20 20 MEQ tablet TAKE 1 TABLET BY MOUTH TWICE A DAY 60 tablet 1   lidocaine-prilocaine (EMLA) cream Apply 1 Application topically as needed. 30 g 12   magic mouthwash (multi-ingredient) oral suspension Swish and spit 5-10 ml by mouth 4 times a day as needed 400 mL 1   magnesium chloride (SLOW-MAG) 64 MG TBEC SR tablet Take 1 tablet (64 mg total) by mouth daily. 30 tablet 3   montelukast (SINGULAIR) 10 MG tablet Take by mouth.     ondansetron (ZOFRAN) 8 MG tablet Take 1 tablet (8 mg total) by mouth 2 (two) times daily as needed (Nausea or vomiting). Start on the third day after chemotherapy. 30 tablet 1   oxyCODONE-acetaminophen (PERCOCET/ROXICET) 5-325 MG tablet Take 1 tablet by mouth every 6 (six) hours as needed for pain.     prochlorperazine (COMPAZINE) 10 MG tablet Take 1 tablet (10 mg total) by mouth every 6 (six) hours as needed (Nausea or vomiting). 30 tablet 1   silver sulfADIAZINE (SILVADENE) 1 % cream Apply 1 Application topically daily. 50 g 1   tamsulosin (FLOMAX) 0.4 MG CAPS capsule Take  0.4 mg by mouth daily.     zinc gluconate 50 MG tablet Take 50 mg by mouth as  needed.     naloxone (NARCAN) nasal spray 4 mg/0.1 mL  (Patient not taking: Reported on 12/17/2021)     No current facility-administered medications for this visit.   Facility-Administered Medications Ordered in Other Visits  Medication Dose Route Frequency Provider Last Rate Last Admin   heparin lock flush 100 unit/mL  500 Units Intracatheter Once PRN [name], MD       magnesium sulfate IVPB 2 g 50 mL  2 g Intravenous Once C[place]ton, Sarah M, PA-C       pertuzumab (PERJETA) 420 mg in sodium chloride 0.9 % 250 mL chemo infusion  420 mg Intravenous Once [name], MD       trastuzumab-anns [facility]) 600 mg in sodium chloride 0.9 % 250 mL chemo infusion  6 mg/kg (Treatment Plan Recorded) Intravenous Once [name], MD 557.1 mL/hr at 08/14/22 1103 600 mg at 08/14/22 1103    Review of Systems  Constitutional:  Negative for appetite change, chills, fatigue and fever.  HENT:   Negative for hearing loss and voice change.        Nasal congestion, pressure  Eyes:  Negative for eye problems.  Respiratory:  Negative for chest tightness and cough.   Cardiovascular:  Negative for chest pain.  Gastrointestinal:  Negative for abdominal distention, abdominal pain, blood in stool and diarrhea.  Endocrine: Negative for hot flashes.  Genitourinary:  Negative for difficulty urinating and frequency.   Musculoskeletal:  Positive for arthralgias and back pain.  Skin:  Negative for itching and rash.  Neurological:  Negative for extremity weakness and headaches.  Hematological:  Negative for adenopathy.  Psychiatric/Behavioral:  Negative for confusion.      PHYSICAL EXAMINATION: ECOG PERFORMANCE STATUS: 1 - Symptomatic but completely ambulatory Vitals:   08/14/22 0933  BP: 109/78  Pulse: 70  Resp: 17  Temp: (!) 97 F (36.1 C)  SpO2: 100%   Filed Weights   08/14/22 0933  Weight: 216 lb (98 kg)    Physical Exam Constitutional:      General: She is not in acute distress.    Appearance: She is  not diaphoretic.  HENT:     Head: Normocephalic and atraumatic.  Eyes:     General: No scleral icterus.    Pupils: Pupils are equal, round, and reactive to light.  Cardiovascular:     Rate and Rhythm: Normal rate and regular rhythm.     Heart sounds: No murmur heard. Pulmonary:     Effort: Pulmonary effort is normal. No respiratory distress.     Breath sounds: No wheezing.  Abdominal:     General: There is no distension.     Palpations: Abdomen is soft.     Tenderness: There is no abdominal tenderness.  Musculoskeletal:        General: Normal range of motion.     Cervical back: Normal range of motion and neck supple.  Skin:    General: Skin is warm and dry.     Findings: No erythema.     Comments: Post radiation dermatitis  Neurological:     Mental Status: She is alert and oriented to person, place, and time.     Cranial Nerves: No cranial nerve deficit.     Motor: No abnormal muscle tone.     Coordination: Coordination normal.  Psychiatric:        Mood and Affect: Affect  normal.        LABORATORY DATA:  I have reviewed the data as listed    Latest Ref Rng & Units 08/14/2022    9:14 AM 07/17/2022    9:16 AM 07/01/2022    9:54 AM  CBC  WBC 4.0 - 10.5 K/uL 4.2  3.7  4.9   Hemoglobin 12.0 - 15.0 g/dL 9.5  9.9  16.1   Hematocrit 36.0 - 46.0 % 30.2  31.6  31.7   Platelets 150 - 400 K/uL 224  223  237       Latest Ref Rng & Units 08/14/2022    9:14 AM 07/17/2022    9:16 AM 06/26/2022    9:14 AM  CMP  Glucose 70 - 99 mg/dL 096  045  409   BUN 6 - 20 mg/dL 17  17  13    Creatinine 0.44 - 1.00 mg/dL 8.11  9.14  7.82   Sodium 135 - 145 mmol/L 137  138  136   Potassium 3.5 - 5.1 mmol/L 3.9  3.9  3.8   Chloride 98 - 111 mmol/L 105  106  103   CO2 22 - 32 mmol/L 26  25  25    Calcium 8.9 - 10.3 mg/dL 8.7  9.0  8.7   Total Protein 6.5 - 8.1 g/dL 7.0  7.2  7.2   Total Bilirubin 0.3 - 1.2 mg/dL 0.2  0.4  0.2   Alkaline Phos 38 - 126 U/L 97  92  98   AST 15 - 41 U/L 18  17   20    ALT 0 - 44 U/L 14  13  12        RADIOGRAPHIC STUDIES: I have personally reviewed the radiological images as listed and agreed with the findings in the report. No results found.

## 2022-08-14 NOTE — Assessment & Plan Note (Signed)
Improving. No signs of infection.

## 2022-08-14 NOTE — Assessment & Plan Note (Signed)
IV magnesium PRN if Mag <1.7 Continue Slow Mag 1 tab daily.  

## 2022-08-14 NOTE — Patient Instructions (Signed)
[place] [facility] [place] [facility]  Discharge Instructions: Thank you for choosing [facility] to provide your oncology and hematology care.  If you have a lab appointment with the Cancer Center, please go directly to the Cancer Center and check in at the registration area.  Wear comfortable clothing and clothing appropriate for easy access to any Portacath or PICC line.   We strive to give you quality time with your provider. You may need to reschedule your appointment if you arrive late (15 or more minutes).  Arriving late affects you and other patients whose appointments are after yours.  Also, if you miss three or more appointments without notifying the office, you may be dismissed from the clinic at the provider's discretion.      For prescription refill requests, have your pharmacy contact our office and allow 72 hours for refills to be completed.    Today you received the following chemotherapy and/or immunotherapy agents Kanjinti & Perjeta      To help prevent nausea and vomiting after your treatment, we encourage you to take your nausea medication as directed.  BELOW ARE SYMPTOMS THAT SHOULD BE REPORTED IMMEDIATELY: *FEVER GREATER THAN 100.4 F (38 C) OR HIGHER *CHILLS OR SWEATING *NAUSEA AND VOMITING THAT IS NOT CONTROLLED WITH YOUR NAUSEA MEDICATION *UNUSUAL SHORTNESS OF BREATH *UNUSUAL BRUISING OR BLEEDING *URINARY PROBLEMS (pain or burning when urinating, or frequent urination) *BOWEL PROBLEMS (unusual diarrhea, constipation, pain near the anus) TENDERNESS IN MOUTH AND THROAT WITH OR WITHOUT PRESENCE OF ULCERS (sore throat, sores in mouth, or a toothache) UNUSUAL RASH, SWELLING OR PAIN  UNUSUAL VAGINAL DISCHARGE OR ITCHING   Items with * indicate a potential emergency and should be followed up as soon as possible or go to the Emergency Department if any problems should occur.  Please show the CHEMOTHERAPY ALERT CARD or IMMUNOTHERAPY ALERT CARD at  check-in to the Emergency Department and triage nurse.  Should you have questions after your visit or need to cancel or reschedule your appointment, please contact [facility] [place] [facility]  [phone] and follow the prompts.  Office hours are 8:00 a.m. to 4:30 p.m. Monday - Friday. Please note that voicemails left after 4:00 p.m. may not be returned until the following business day.  We are closed weekends and major holidays. You have access to a nurse at all times for urgent questions. Please call the main number to the clinic [phone] and follow the prompts.  For any non-urgent questions, you may also contact your provider using MyChart. We now offer e-Visits for anyone 18 and older to request care online for non-urgent symptoms. For details visit [email or web address].   Also download the MyChart app! Go to the app store, search "MyChart", open the app, select [place], and log in with your MyChart username and password.    

## 2022-08-14 NOTE — Assessment & Plan Note (Signed)
Hemoglobin is stable. Monitor counts. 

## 2022-08-14 NOTE — Assessment & Plan Note (Signed)
continue potassium chloride 20meq twice daily.  
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
## 2022-08-14 NOTE — Assessment & Plan Note (Signed)
Probably contribute to leg cramps.  Recommend calcium an vitamin D supplementation.

## 2022-08-14 NOTE — Assessment & Plan Note (Signed)
Obtain CXR, ordered but not done.

## 2022-08-14 NOTE — Assessment & Plan Note (Signed)
Self stopped lexapro 10mg daily.-  She feels symptoms are better  

## 2022-08-14 NOTE — Assessment & Plan Note (Addendum)
Left breast cancer, cT4b N3 Mx, ER/PR-, HER2 + S/p neoadjuvant chemotherapy 6 cycles of TCHP  S/p left mastectomy + SLNB -ypT0 ypN0, complete pathological response.  Recommend adjuvant Transtuzumab and Pertuzumab to complete 1 year treatment if she tolerates.   Labs reviewed and discussed with patient.  Proceed with trastuzumab and pertuzumab today.   April 2024 echocardiogram- stable LVEF- repeat in July Follow up with radiation oncology for adjuvant radiation- last RT 7/1 Recommend adjuvant Zometa in future

## 2022-08-20 ENCOUNTER — Ambulatory Visit: Admission: RE | Admit: 2022-08-20 | Payer: Medicare HMO | Source: Ambulatory Visit

## 2022-08-28 ENCOUNTER — Encounter: Payer: Self-pay | Admitting: Oncology

## 2022-08-31 IMAGING — US US BREAST*L* LIMITED INC AXILLA
1 series · 10 of 10 positions shown · non-contrast
Comparison: None Available.

CLINICAL DATA: 61-year-old female currently in the ER with
complaints of a palpable lump in her left breast. There is no
associated pain.

EXAM:
ULTRASOUND OF THE LEFT BREAST

[Series 1: us breast ltd uni left inc axilla · 10 of 10 slices shown]
[im 1/10]
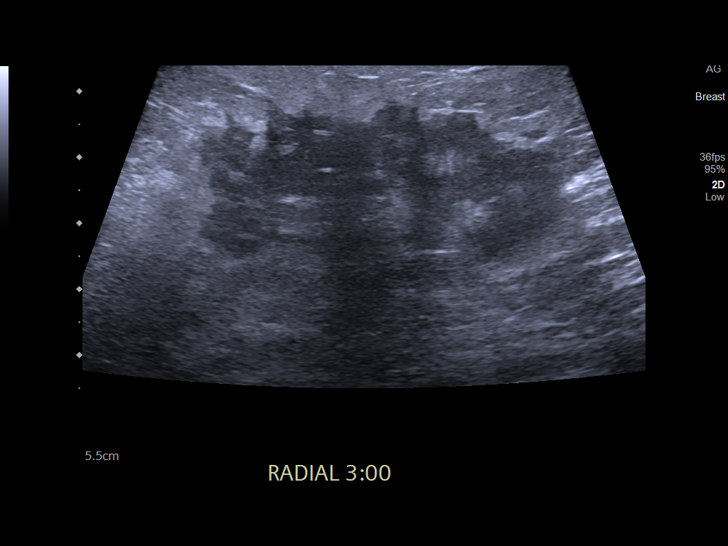
[im 2/10]
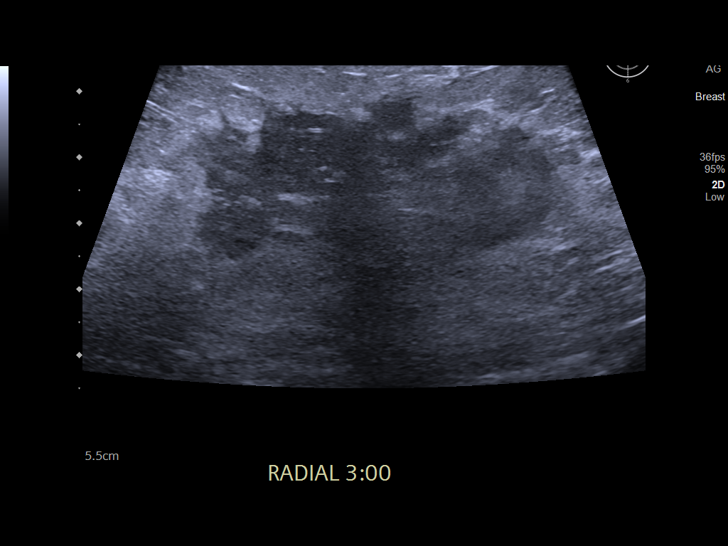
[im 3/10]
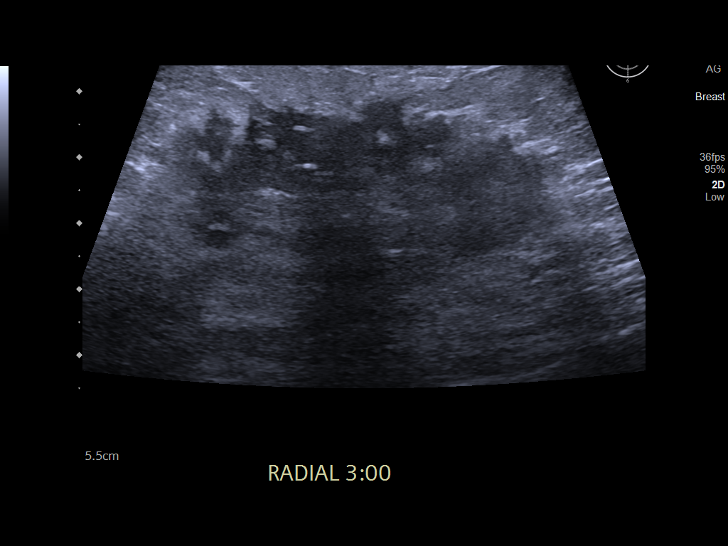
[im 4/10]
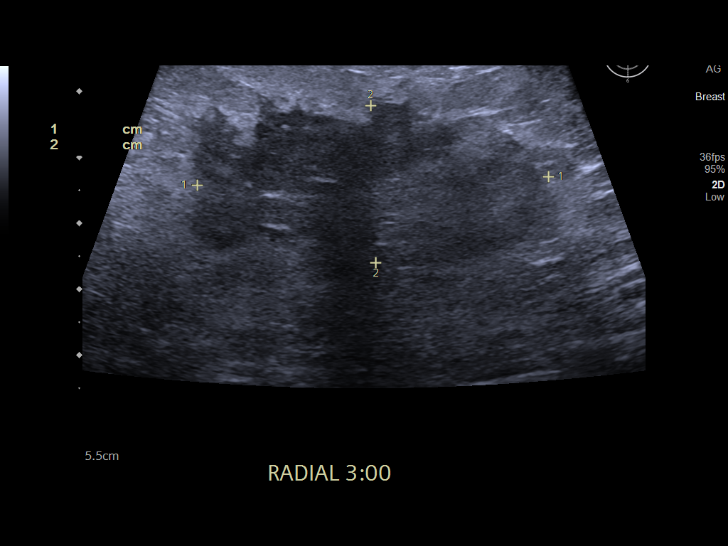
[im 5/10]
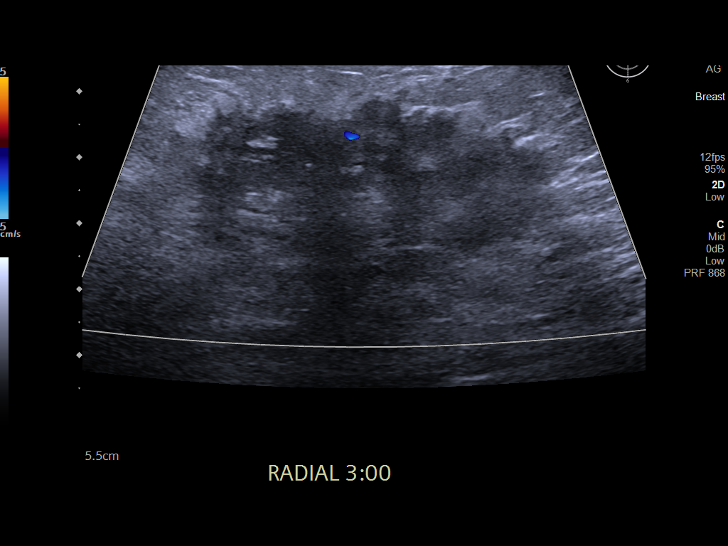
[im 6/10]
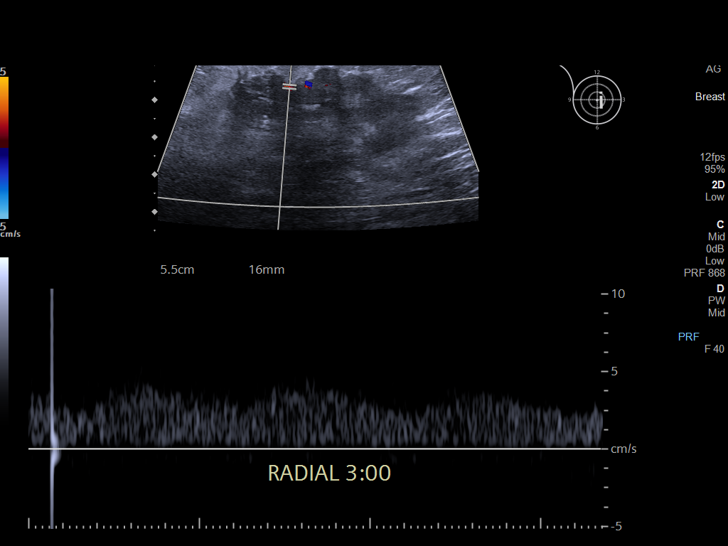
[im 7/10]
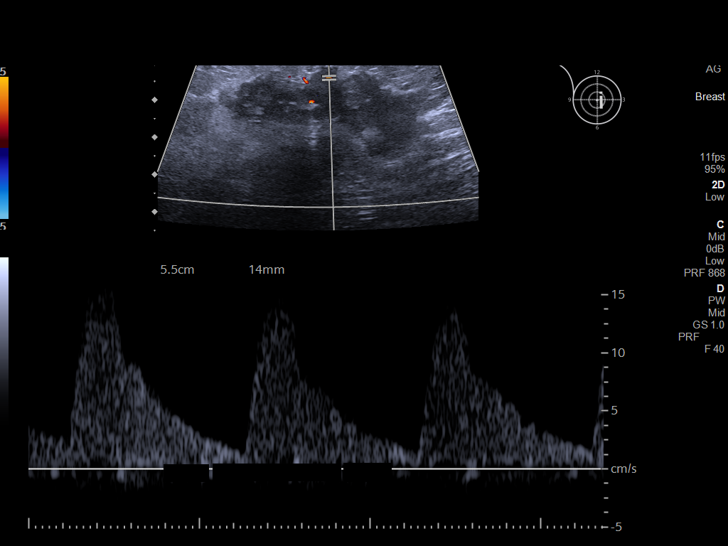
[im 8/10]
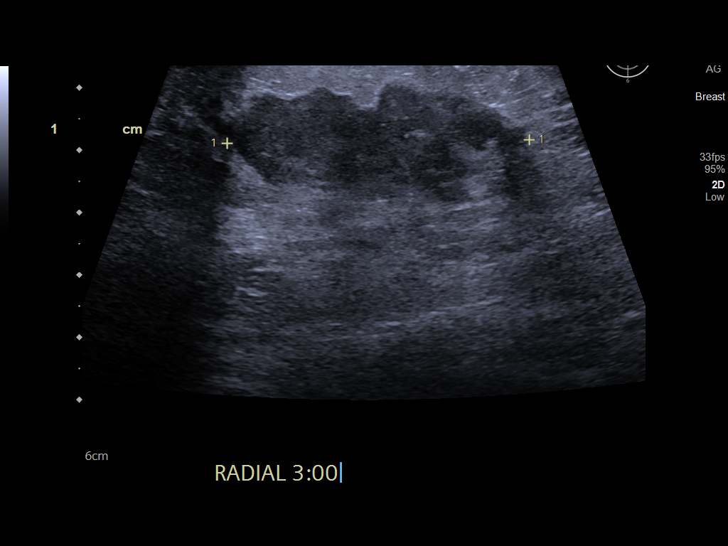
[im 9/10]
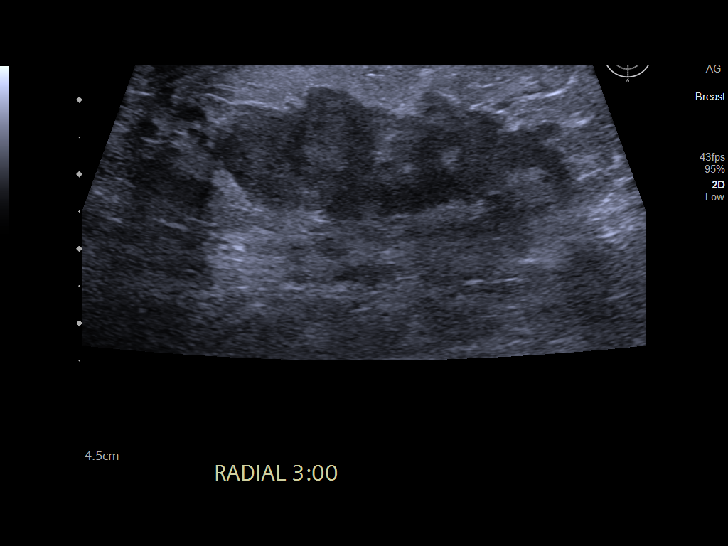
[im 10/10]
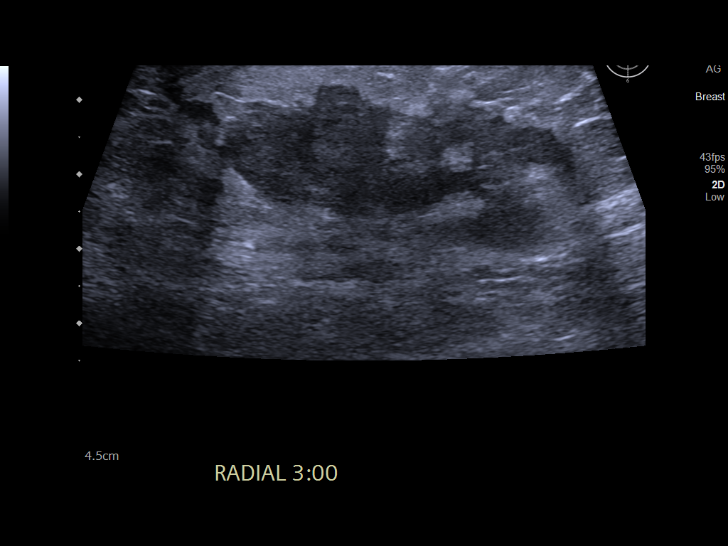

[10 of 10 positions shown; findings below may reference images not displayed]

FINDINGS: Ultrasound targeted to the palpable lump in the left breast at 3
o'clock demonstrates a large irregular mass with internal blood
flow. The mass measures 5.3 x 2.4 x 4.8 cm.
IMPRESSION: Targeted ultrasound images demonstrate a 5.3 cm mass in the left
breast. Given the lack of infectious symptoms related to the breast,
this appearance is suspicious for malignancy and requires further
evaluation.

RECOMMENDATION:
1. An outpatient diagnostic bilateral mammogram and targeted left
breast ultrasound is recommended. Biopsy will likely be necessary.
The patient indicates that she has an outside mammogram from last
year, which if available will be helpful for comparison purposes.

The findings and recommendation were discussed with Ru Holguin
in the ER at [DATE] p.m.

I have discussed the findings and recommendations with the patient.
If applicable, a reminder letter will be sent to the patient
regarding the next appointment.

BI-RADS CATEGORY  0: Incomplete. Need additional imaging evaluation
and/or prior mammograms for comparison.

## 2022-09-02 ENCOUNTER — Encounter: Payer: Self-pay | Admitting: Radiation Oncology

## 2022-09-02 ENCOUNTER — Ambulatory Visit
Admission: RE | Admit: 2022-09-02 | Discharge: 2022-09-02 | Disposition: A | Discharge status: Home / Self Care | Payer: Medicare HMO | Source: Ambulatory Visit | Attending: Radiation Oncology | Admitting: Radiation Oncology

## 2022-09-02 VITALS — BP 97/66 | HR 84 | Temp 98.5°F | Resp 16 | Ht 66.0 in | Wt 215.5 lb

## 2022-09-02 DIAGNOSIS — C50012 Malignant neoplasm of nipple and areola, left female breast: Secondary | ICD-10-CM | POA: Diagnosis not present

## 2022-09-02 DIAGNOSIS — Z171 Estrogen receptor negative status [ER-]: Secondary | ICD-10-CM

## 2022-09-02 NOTE — Progress Notes (Signed)
Radiation Oncology Follow up Note  Name: Vanessa Fisher   Date:   09/02/2022 MRN:  [id number] DOB: [birth year]    This 62 y.o. 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  Seen today in routine follow-up she is doing well.  She specifically denies breast tenderness cough or bone pain.  Her desquamation of the left axilla has cleared..  She is currently onadjuvant Transtuzumab and Pertuzumab which will continue for a year.  She is tolerating that well.  COMPLICATIONS OF TREATMENT: none  FOLLOW UP COMPLIANCE: keeps appointments   PHYSICAL EXAM:  BP 97/66   Pulse 84   Temp 98.5 F (36.9 C)   Resp 16   Ht 5\' 6"  (1.676 m)   Wt 215 lb 8 oz (97.8 kg)   BMI 34.78 kg/m  Lungs are clear to A&P cardiac examination essentially unremarkable with regular rate and rhythm. No dominant mass or nodularity is noted in either breast in 2 positions examined. Incision is well-healed. No axillary or supraclavicular adenopathy is appreciated. Cosmetic result is excellent.  Well-developed well-nourished patient in NAD. HEENT reveals PERLA, EOMI, discs not visualized.  Oral cavity is clear. No oral mucosal lesions are identified. Neck is clear without evidence of cervical or supraclavicular adenopathy. Lungs are clear to A&P. Cardiac examination is essentially unremarkable with regular rate and rhythm without murmur rub or thrill. Abdomen is benign with no  organomegaly or masses noted. Motor sensory and DTR levels are equal and symmetric in the upper and lower extremities. Cranial nerves II through XII are grossly intact. Proprioception is intact. No peripheral adenopathy or edema is identified. No motor or sensory levels are noted. Crude visual fields are within normal range.  RADIOLOGY RESULTS: No current films for review  PLAN: Present time patient is doing well 1 month out from whole breast radiation.  She continues close follow-up care and treatment through medical oncology.  I have asked to see her back in 6 months for follow-up.  Patient is to call sooner with any concerns.  I would like to take this opportunity to thank you for allowing me to participate in the care of your patient.Carmina Miller, MD

## 2022-09-16 ENCOUNTER — Other Ambulatory Visit: Payer: Self-pay | Admitting: Oncology

## 2022-09-18 ENCOUNTER — Encounter: Payer: Self-pay | Admitting: Oncology

## 2022-09-18 ENCOUNTER — Other Ambulatory Visit: Payer: Self-pay | Admitting: Oncology

## 2022-09-18 DIAGNOSIS — C50812 Malignant neoplasm of overlapping sites of left female breast: Secondary | ICD-10-CM

## 2022-09-23 ENCOUNTER — Inpatient Hospital Stay: Payer: Medicare HMO | Attending: Oncology | Admitting: Oncology

## 2022-09-23 ENCOUNTER — Encounter: Payer: Self-pay | Admitting: Oncology

## 2022-09-23 ENCOUNTER — Inpatient Hospital Stay: Payer: Medicare HMO

## 2022-09-23 VITALS — BP 114/45 | HR 56 | Temp 97.0°F | Resp 18 | Wt 219.8 lb

## 2022-09-23 VITALS — BP 143/55 | HR 60 | Temp 97.3°F | Resp 18

## 2022-09-23 DIAGNOSIS — R252 Cramp and spasm: Secondary | ICD-10-CM

## 2022-09-23 DIAGNOSIS — C50812 Malignant neoplasm of overlapping sites of left female breast: Secondary | ICD-10-CM

## 2022-09-23 DIAGNOSIS — C50912 Malignant neoplasm of unspecified site of left female breast: Secondary | ICD-10-CM | POA: Diagnosis present

## 2022-09-23 DIAGNOSIS — G8929 Other chronic pain: Secondary | ICD-10-CM

## 2022-09-23 DIAGNOSIS — E876 Hypokalemia: Secondary | ICD-10-CM | POA: Diagnosis not present

## 2022-09-23 DIAGNOSIS — D649 Anemia, unspecified: Secondary | ICD-10-CM | POA: Diagnosis not present

## 2022-09-23 DIAGNOSIS — M549 Dorsalgia, unspecified: Secondary | ICD-10-CM | POA: Diagnosis not present

## 2022-09-23 DIAGNOSIS — M545 Low back pain, unspecified: Secondary | ICD-10-CM

## 2022-09-23 DIAGNOSIS — Z5112 Encounter for antineoplastic immunotherapy: Secondary | ICD-10-CM | POA: Insufficient documentation

## 2022-09-23 DIAGNOSIS — C50911 Malignant neoplasm of unspecified site of right female breast: Secondary | ICD-10-CM | POA: Diagnosis not present

## 2022-09-23 DIAGNOSIS — Z171 Estrogen receptor negative status [ER-]: Secondary | ICD-10-CM | POA: Diagnosis not present

## 2022-09-23 LAB — CMP (CANCER CENTER ONLY)
ALT: 13 U/L (ref 0–44)
AST: 18 U/L (ref 15–41)
Albumin: 3.3 g/dL — ABNORMAL LOW (ref 3.5–5.0)
Alkaline Phosphatase: 97 U/L (ref 38–126)
Anion gap: 7 (ref 5–15)
BUN: 10 mg/dL (ref 6–20)
CO2: 24 mmol/L (ref 22–32)
Calcium: 8.4 mg/dL — ABNORMAL LOW (ref 8.9–10.3)
Chloride: 107 mmol/L (ref 98–111)
Creatinine: 0.76 mg/dL (ref 0.44–1.00)
GFR, Estimated: >60 mL/min (ref >60)
Glucose, Bld: 119 mg/dL — ABNORMAL HIGH (ref 70–99)
Potassium: 3.6 mmol/L (ref 3.5–5.1)
Sodium: 138 mmol/L (ref 135–145)
Total Bilirubin: 0.4 mg/dL (ref 0.3–1.2)
Total Protein: 6.7 g/dL (ref 6.5–8.1)

## 2022-09-23 LAB — CBC (CANCER CENTER ONLY)
HCT: 30.3 % — ABNORMAL LOW (ref 36.0–46.0)
Hemoglobin: 9.4 g/dL — ABNORMAL LOW (ref 12.0–15.0)
MCH: 26 pg (ref 26.0–34.0)
MCHC: 31 g/dL (ref 30.0–36.0)
MCV: 83.9 fL (ref 80.0–100.0)
Platelet Count: 231 K/uL (ref 150–400)
RBC: 3.61 MIL/uL — ABNORMAL LOW (ref 3.87–5.11)
RDW: 14.5 % (ref 11.5–15.5)
WBC Count: 3.8 K/uL — ABNORMAL LOW (ref 4.0–10.5)
nRBC: 0 % (ref 0.0–0.2)

## 2022-09-23 LAB — MAGNESIUM: Magnesium: 1.6 mg/dL — ABNORMAL LOW (ref 1.7–2.4)

## 2022-09-23 MED ORDER — SODIUM CHLORIDE 0.9 % IV SOLN
420.0000 mg | Freq: Once | INTRAVENOUS | Status: AC
  Administered 2022-09-23: 420 mg via INTRAVENOUS
  Filled 2022-09-23: qty 14

## 2022-09-23 MED ORDER — ACETAMINOPHEN 325 MG PO TABS
650.0000 mg | ORAL_TABLET | Freq: Once | ORAL | Status: AC
  Administered 2022-09-23: 650 mg via ORAL
  Filled 2022-09-23: qty 2

## 2022-09-23 MED ORDER — DIPHENOXYLATE-ATROPINE 2.5-0.025 MG PO TABS: 1.0000 | ORAL_TABLET | Freq: Four times a day (QID) | ORAL | 0 refills | Status: DC | PRN

## 2022-09-23 MED ORDER — SODIUM CHLORIDE 0.9 % IV SOLN
Freq: Once | INTRAVENOUS | Status: AC
  Filled 2022-09-23: qty 250

## 2022-09-23 MED ORDER — MAGNESIUM SULFATE 2 GM/50ML IV SOLN
2.0000 g | Freq: Once | INTRAVENOUS | Status: AC
  Administered 2022-09-23: 2 g via INTRAVENOUS
  Filled 2022-09-23: qty 50

## 2022-09-23 MED ORDER — DIPHENHYDRAMINE HCL 25 MG PO CAPS
50.0000 mg | ORAL_CAPSULE | Freq: Once | ORAL | Status: AC
  Administered 2022-09-23: 50 mg via ORAL
  Filled 2022-09-23: qty 2

## 2022-09-23 MED ORDER — TRASTUZUMAB-ANNS CHEMO 150 MG IV SOLR
6.0000 mg/kg | Freq: Once | INTRAVENOUS | Status: AC
  Administered 2022-09-23: 600 mg via INTRAVENOUS
  Filled 2022-09-23: qty 28.57

## 2022-09-23 MED ORDER — HEPARIN SOD (PORK) LOCK FLUSH 100 UNIT/ML IV SOLN
500.0000 [IU] | Freq: Once | INTRAVENOUS | Status: AC | PRN
  Administered 2022-09-23: 500 [IU]
  Filled 2022-09-23: qty 5

## 2022-09-23 NOTE — Assessment & Plan Note (Signed)
Hemoglobin is stable. Monitor counts.

## 2022-09-23 NOTE — Assessment & Plan Note (Addendum)
Chronic issue for her.  Xray was not done, per patient her pain doctor has done the xray.  Result is not available to me in current EMR. Will obtain results.

## 2022-09-23 NOTE — Assessment & Plan Note (Addendum)
Left breast cancer, cT4b N3 Mx, ER/PR-, HER2 + S/p neoadjuvant chemotherapy 6 cycles of TCHP  S/p left mastectomy + SLNB -ypT0 ypN0, complete pathological response.  Recommend adjuvant Transtuzumab and Pertuzumab to complete 1 year treatment if she tolerates.  S/p adjuvant radiation- last RT 7/1  Labs reviewed and discussed with patient.   Proceed with trastuzumab and pertuzumab today.   Discussed with her about importance of treatment compliance . April 2024 echocardiogram- stable LVEF- repeat  Recommend adjuvant Zometa- she declined.

## 2022-09-23 NOTE — Assessment & Plan Note (Signed)
continue potassium chloride 20meq twice daily.  

## 2022-09-23 NOTE — Progress Notes (Signed)
Hematology/Oncology Progress note [phone] [phone]     CHIEF COMPLAINTS/REASON FOR VISIT:  Follow-up for Stage IIIB left breast cancer, ER-, HER2+  ASSESSMENT & PLAN:   Cancer Staging  Breast cancer [facility]) Staging form: Breast, AJCC 8th Edition - Clinical stage from 08/21/2021: Stage IIIB (cT4b, cN3c, cM0, G3, ER-, PR-, HER2+) - Signed by [name], MD on 09/12/2021  Breast cancer (HCC) Left breast cancer, cT4b N3 Mx, ER/PR-, HER2 + S/p neoadjuvant chemotherapy 6 cycles of TCHP  S/p left mastectomy + SLNB -ypT0 ypN0, complete pathological response.  Recommend adjuvant Transtuzumab and Pertuzumab to complete 1 year treatment if she tolerates.  S/p adjuvant radiation- last RT 7/1  Labs reviewed and discussed with patient.   Proceed with trastuzumab and pertuzumab today.   Discussed with her about importance of treatment compliance . April 2024 echocardiogram- stable LVEF- repeat  Recommend adjuvant Zometa- she declined.    Normocytic anemia Hemoglobin is stable. Monitor counts.   Hypomagnesemia IV magnesium PRN if Mag <1.7 Continue Slow Mag 1 tab daily.   Hypokalemia continue potassium chloride twice daily.   Back pain Chronic issue for her.  Xray was not done, per patient her pain doctor has done the xray.  Result is not available to me in current EMR. Will obtain results.      Muscle cramps Possibly due to hypocalcemia.  Recommend patient to resume calcium and vitamin D supplementation.    Orders Placed This Encounter  Procedures   CBC (Cancer Center Only)    Standing Status:   Future    Standing Expiration Date:   10/14/2023   CMP (Cancer Center only)    Standing Status:   Future    Standing Expiration Date:   10/14/2023   Magnesium    Standing Status:   Future    Standing Expiration Date:   10/14/2023     All questions were answered. The patient knows to call the clinic with any problems questions or concerns.  Return  of visit:  3 weeks lab MD Transtuzumab + Pertuzumab +/- IV magnesium  [name], MD, PhD [facility] Hematology Oncology 09/23/2022      HISTORY OF PRESENTING ILLNESS:  Vanessa Fisher is a  62 y.o.  female with PMH listed below who was referred to me for evaluation of  left breast cancer SUMMARY OF ONCOLOGIC HISTORY: Oncology History  Breast cancer (HCC)  07/31/2021 Imaging   Bilateral diagnostic mammogram and US showed 5 centimeter LEFT breast mass associated with pleomorphic calcifications is suspicious for invasive ductal carcinoma.At least 4 LEFT axillary lymph nodes with abnormal morphology   08/21/2021 Cancer Staging   Staging form: Breast, AJCC 8th Edition - Clinical: Stage IIIB (cT4b, cN3c, cM0, G3, ER-, PR-, HER2+) - Signed by [name], MD on 08/28/2021 Histologic grading system: 3 grade system  -08/21/21 left breast ultrasound-guided biopsy showed invasive mammary carcinoma, grade 3, ER/PR negative, HER2 positive.  Left axillary lymph node biopsy positive for macro metastatic mammary carcinoma, 8 mm in greatest extent.   08/30/2021 Imaging   MRI brain w wo contrast -No metastatic disease or acute intracranial abnormality. Essentially normal for age MRI appearance of the brain.    09/03/2021 Echocardiogram   1. Left ventricular ejection fraction, by estimation, is 55 to 60%. Left ventricular ejection fraction by 3D volume is 58 %. The left ventricle has normal function. The left ventricle has no regional wall motion abnormalities. There is mild left  ventricular hypertrophy. Left ventricular diastolic parameters  were normal.  2. Right ventricular systolic function is normal. The right ventricular size is normal.  3. The mitral valve is normal in structure. No evidence of mitral valve regurgitation.  4. The aortic valve was not well visualized. Aortic valve regurgitation is not visualized   09/10/2021 Imaging   PET scan showed Large hypermetabolic left breast mass and  diffuse skin thickening,consistent with primary breast carcinoma. Hypermetabolic lymphadenopathy in the left axilla, left subpectoral region, and left supraclavicular region, consistent with metastatic disease. No evidence of metastatic disease within the abdomen or pelvis   09/12/2021 - 02/13/2022 Chemotherapy   Patient is on Treatment Plan : BREAST  Docetaxel + Carboplatin + Trastuzumab + Pertuzumab  (TCHP) q21d       Genetic Testing   Negative genetic testing. No pathogenic variants identified on the [facility] Common Hereditary Cancers+RNA panel. The report date is 10/26/2021.  The Common Hereditary Cancers Panel + RNA offered by [facility] includes sequencing and/or deletion duplication testing of the following 47 genes: APC, ATM, AXIN2, BARD1, BMPR1A, BRCA1, BRCA2, BRIP1, CDH1, CDKN2A (p14ARF), CDKN2A (p16INK4a), CKD4, CHEK2, CTNNA1, DICER1, EPCAM (Deletion/duplication testing only), GREM1 (promoter region deletion/duplication testing only), KIT, MEN1, MLH1, MSH2, MSH3, MSH6, MUTYH, NBN, NF1, NHTL1, PALB2, PDGFRA, PMS2, POLD1, POLE, PTEN, RAD50, RAD51C, RAD51D, SDHB, SDHC, SDHD, SMAD4, SMARCA4. STK11, TP53, TSC1, TSC2, and VHL.  The following genes were evaluated for sequence changes only: SDHA and HOXB13 c.251G>A variant only.   02/09/2022 Echocardiogram   LVEF 55 to 60%    03/27/2022 Surgery   S/p left mastectomy + SLNB ypT0 ypN0,    04/24/2022 -  Chemotherapy   Patient is on Treatment Plan : BREAST Trastuzumab  + Pertuzumab q21d x 13 cycles     05/06/2022 Echocardiogram   LVEF 55 to 60%    07/02/2022 - 08/03/2022 Radiation Therapy   Adjuvant breast radiation.     + chronic nasal pressure, congestion, horse voice she attributes to sinusitis.   INTERVAL HISTORY Vanessa Fisher is a 62 y.o. female who has above history reviewed by me today presents for follow up visit for ER negative HER2 positive breast cancer treatment.  She has no showed to her July chemotherapy appointment x 2  times. She denies any diarrhea episodes since last visit.Marland Kitchen No fever or chills.  + lower extremity cramps.    MEDICAL HISTORY:  Past Medical History:  Diagnosis Date   Anemia    Anxiety    Arthritis    Asthma    WELL CONTROLLED   Bile acid malabsorption syndrome    Bradycardia    HAD AN ISSUE WITH THIS IN 2016-NO PROBLEMS SINCE   Breast cancer, left breast (HCC) 08/2021   Carpal tunnel syndrome on right    Depression    Diabetes mellitus without complication (HCC)    Diverticulitis    Gastric reflux    GERD (gastroesophageal reflux disease)    Hand pain 05/12/2016   Headache    H/O MIGRAINES   History of kidney stones    H/O   Lumbar radiculitis    Neck pain, chronic    Osteoarthritis of both knees    Panic attacks     SURGICAL HISTORY: Past Surgical History:  Procedure Laterality Date   BREAST BIOPSY Left 08/21/2021   Axilla Bx, Hydromarker, neg   BREAST BIOPSY Left 08/21/2021   [place] Bx, Ribbon Clip, invasive mammary carcinoma   BREAST BIOPSY Left 03/12/2022   [place] LT RADIO FREQUENCY TAG LOC US GUIDE 03/12/2022 [facility]  BREAST BIOPSY Left 03/12/2022   [place] LT RADIO FREQUENCY TAG EA ADD LESION LOC US GUIDE 03/12/2022 [facility]   CARPAL TUNNEL RELEASE Right 12/06/2017   Procedure: CARPAL TUNNEL RELEASE;  Surgeon: Deeann Saint, MD;  Location: [facility];  Service: Orthopedics;  Laterality: Right;   COLONOSCOPY WITH PROPOFOL N/A 06/11/2021   Procedure: COLONOSCOPY WITH PROPOFOL;  Surgeon: Toney Reil, MD;  Location: [facility] ENDOSCOPY;  Service: Gastroenterology;  Laterality: N/A;   DORSAL COMPARTMENT RELEASE Right 12/06/2017   Procedure: RELEASE DORSAL COMPARTMENT (DEQUERVAIN);  Surgeon: Deeann Saint, MD;  Location: [facility] ORS;  Service: Orthopedics;  Laterality: Right;   ESOPHAGOGASTRODUODENOSCOPY (EGD) WITH PROPOFOL N/A 06/11/2021   Procedure: ESOPHAGOGASTRODUODENOSCOPY (EGD) WITH PROPOFOL;  Surgeon: Toney Reil, MD;  Location: [facility] ENDOSCOPY;   Service: Gastroenterology;  Laterality: N/A;   PART MASTECTOMY,RADIO FREQUENCY LOCALIZER,AXILLARY SENTINEL NODE BIOPSY Left 03/27/2022   Procedure: PART MASTECTOMY,RADIO FREQUENCY LOCALIZER,AXILLARY SENTINEL NODE BIOPSY;  Surgeon: Carolan Shiver, MD;  Location: [facility] ORS;  Service: General;  Laterality: Left;   PARTIAL HYSTERECTOMY     PORTACATH PLACEMENT N/A 09/24/2021   Procedure: INSERTION PORT-A-CATH;  Surgeon: Carolan Shiver, MD;  Location: [facility] ORS;  Service: General;  Laterality: N/A;   TUBAL LIGATION      SOCIAL HISTORY: Social History   Socioeconomic History   Marital status: Single    Spouse name: Not on file   Number of children: Not on file   Years of education: Not on file   Highest education level: Not on file  Occupational History   Not on file  Tobacco Use   Smoking status: Former    Types: Cigarettes   Smokeless tobacco: Never  Vaping Use   Vaping status: Never Used  Substance and Sexual Activity   Alcohol use: No   Drug use: No   Sexual activity: Not on file  Other Topics Concern   Not on file  Social History Narrative   Lives alone   Social Determinants of Health   Financial Resource Strain: High Risk (08/15/2021)   Overall Financial Resource Strain (CARDIA)    Difficulty of Paying Living Expenses: Very hard  Food Insecurity: Food Insecurity Present (09/09/2021)   Hunger Vital Sign    Worried About Running Out of Food in the Last Year: Often true    Ran Out of Food in the Last Year: Often true  Transportation Needs: No Transportation Needs (08/15/2021)   PRAPARE - Administrator, Civil Service (Medical): No    Lack of Transportation (Non-Medical): No  Physical Activity: Inactive (08/15/2021)   Exercise Vital Sign    Days of Exercise per Week: 0 days    Minutes of Exercise per Session: 0 min  Stress: Stress Concern Present (08/15/2021)   [employer] of Occupational Health - Occupational Stress Questionnaire    Feeling of  Stress : Rather much  Social Connections: Socially Isolated (08/15/2021)   Social Connection and Isolation Panel [NHANES]    Frequency of Communication with Friends and Family: Once a week    Frequency of Social Gatherings with Friends and Family: Once a week    Attends Religious Services: Never    Database administrator or Organizations: No    Attends Engineer, structural: Not on file    Marital Status: Never married  Intimate Partner Violence: Not At Risk (08/15/2021)   Humiliation, Afraid, Rape, and Kick questionnaire    Fear of Current or Ex-Partner: No    Emotionally Abused: No    Physically  Abused: No    Sexually Abused: No    FAMILY HISTORY: Family History  Problem Relation Age of Onset   Diabetes Mother    Hypertension Mother    Heart failure Mother    Pancreatic cancer Mother 60   Diabetes Father    Hypertension Father    Breast cancer Maternal Aunt    Cervical cancer Maternal Aunt    Lung cancer Son 89    ALLERGIES:  is allergic to bc powder [aspirin-salicylamide-caffeine], doxycycline, gabapentin, hydrocodone-acetaminophen, latex, penicillin g, penicillins, and shellfish allergy.  MEDICATIONS:  Current Outpatient Medications  Medication Sig Dispense Refill   albuterol (PROVENTIL) (2.5 MG/3ML) 0.083% nebulizer solution Take 3 mLs (2.5 mg total) by nebulization every 4 (four) hours as needed for wheezing or shortness of breath. 75 mL 2   albuterol (VENTOLIN HFA) 108 (90 Base) MCG/ACT inhaler Inhale 2 puffs into the lungs every 6 (six) hours as needed for wheezing or shortness of breath. 18 g 0   atorvastatin (LIPITOR) 10 MG tablet Take 10 mg by mouth daily.     Azelastine HCl 137 MCG/SPRAY SOLN Place 1 spray into the nose daily. 30 mL 0   calcium carbonate (OS-CAL - DOSED IN MG OF ELEMENTAL CALCIUM) 1250 (500 Ca) MG tablet Take 1 tablet (1,250 mg total) by mouth daily. 90 tablet 1   cephALEXin (KEFLEX) 500 MG capsule Take 500 mg by mouth every 12 (twelve)  hours.     cetirizine (ZYRTEC ALLERGY) 10 MG tablet Take 1 tablet (10 mg total) by mouth daily. 90 tablet 0   escitalopram (LEXAPRO) 10 MG tablet Take 1 tablet (10 mg total) by mouth daily. 30 tablet 1   esomeprazole (NEXIUM) 40 MG capsule Take 1 capsule (40 mg total) by mouth daily at 12 noon. 30 capsule 3   FLOVENT DISKUS 250 MCG/ACT AEPB Inhale 1 puff into the lungs daily.     fluticasone (FLONASE) 50 MCG/ACT nasal spray Place 2 sprays into both nostrils daily. 15.8 mL 0   Iron-Vitamin C 65-125 MG TABS Take 1 tablet by mouth daily at 2 PM. 30 tablet 2   KLOR-CON M20 20 MEQ tablet TAKE 1 TABLET BY MOUTH TWICE A DAY 60 tablet 1   lidocaine-prilocaine (EMLA) cream Apply 1 Application topically as needed. 30 g 12   magic mouthwash (multi-ingredient) oral suspension Swish and spit 5-10 ml by mouth 4 times a day as needed 400 mL 1   magnesium chloride (SLOW-MAG) 64 MG TBEC SR tablet Take 1 tablet (64 mg total) by mouth daily. 30 tablet 3   montelukast (SINGULAIR) 10 MG tablet Take by mouth.     naloxone (NARCAN) nasal spray 4 mg/0.1 mL      ondansetron (ZOFRAN) 8 MG tablet Take 1 tablet (8 mg total) by mouth 2 (two) times daily as needed (Nausea or vomiting). Start on the third day after chemotherapy. 30 tablet 1   oxyCODONE-acetaminophen (PERCOCET/ROXICET) 5-325 MG tablet Take 1 tablet by mouth every 6 (six) hours as needed for pain.     prochlorperazine (COMPAZINE) 10 MG tablet Take 1 tablet (10 mg total) by mouth every 6 (six) hours as needed (Nausea or vomiting). 30 tablet 1   silver sulfADIAZINE (SILVADENE) 1 % cream Apply 1 Application topically daily. 50 g 1   tamsulosin (FLOMAX) 0.4 MG CAPS capsule Take 0.4 mg by mouth daily.     zinc gluconate 50 MG tablet Take 50 mg by mouth as needed.     diphenoxylate-atropine (LOMOTIL) 2.5-0.025 MG  tablet Take 1 tablet by mouth 4 (four) times daily as needed for diarrhea or loose stools. 120 tablet 0   No current facility-administered medications for  this visit.   Facility-Administered Medications Ordered in Other Visits  Medication Dose Route Frequency Provider Last Rate Last Admin   0.9 %  sodium chloride infusion   Intravenous Once [name], MD       acetaminophen (TYLENOL) tablet 650 mg  650 mg Oral Once [name], MD       diphenhydrAMINE (BENADRYL) capsule 50 mg  50 mg Oral Once [name], MD       heparin lock flush 100 unit/mL  500 Units Intracatheter Once PRN [name], MD       magnesium sulfate IVPB 2 g 50 mL  2 g Intravenous Once Covington, Sarah M, PA-C       magnesium sulfate IVPB 2 g 50 mL  2 g Intravenous Once [name], MD       pertuzumab (PERJETA) 420 mg in sodium chloride 0.9 % 250 mL chemo infusion  420 mg Intravenous Once [name], MD       trastuzumab-anns [facility]) 600 mg in sodium chloride 0.9 % 250 mL chemo infusion  6 mg/kg (Treatment Plan Recorded) Intravenous Once [name], MD        Review of Systems  Constitutional:  Negative for appetite change, chills, fatigue and fever.  HENT:   Negative for hearing loss and voice change.   Eyes:  Negative for eye problems.  Respiratory:  Negative for chest tightness and cough.   Cardiovascular:  Negative for chest pain.  Gastrointestinal:  Negative for abdominal distention, abdominal pain, blood in stool and diarrhea.  Endocrine: Negative for hot flashes.  Genitourinary:  Negative for difficulty urinating and frequency.   Musculoskeletal:  Positive for arthralgias and back pain.       Leg cramps  Skin:  Negative for itching and rash.  Neurological:  Negative for extremity weakness and headaches.  Hematological:  Negative for adenopathy.  Psychiatric/Behavioral:  Negative for confusion.      PHYSICAL EXAMINATION: ECOG PERFORMANCE STATUS: 1 - Symptomatic but completely ambulatory Vitals:   09/23/22 0859  BP: (!) 114/45  Pulse: (!) 56  Resp: 18  Temp: (!) 97 F (36.1 C)  SpO2: 100%   Filed Weights   09/23/22 0859  Weight: 219 lb 12.8 oz (99.7 kg)     Physical Exam Constitutional:      General: She is not in acute distress.    Appearance: She is not diaphoretic.  HENT:     Head: Normocephalic and atraumatic.  Eyes:     General: No scleral icterus.    Pupils: Pupils are equal, round, and reactive to light.  Cardiovascular:     Rate and Rhythm: Normal rate and regular rhythm.     Heart sounds: No murmur heard. Pulmonary:     Effort: Pulmonary effort is normal. No respiratory distress.     Breath sounds: No wheezing.  Abdominal:     General: There is no distension.     Palpations: Abdomen is soft.     Tenderness: There is no abdominal tenderness.  Musculoskeletal:        General: Normal range of motion.     Cervical back: Normal range of motion and neck supple.  Skin:    General: Skin is warm and dry.     Findings: No erythema.  Neurological:     Mental Status: She is alert  and oriented to person, place, and time.     Cranial Nerves: No cranial nerve deficit.     Motor: No abnormal muscle tone.     Coordination: Coordination normal.  Psychiatric:        Mood and Affect: Affect normal.        LABORATORY DATA:  I have reviewed the data as listed    Latest Ref Rng & Units 09/23/2022    8:44 AM 08/14/2022    9:14 AM 07/17/2022    9:16 AM  CBC  WBC 4.0 - 10.5 K/uL 3.8  4.2  3.7   Hemoglobin 12.0 - 15.0 g/dL 9.4  9.5  9.9   Hematocrit 36.0 - 46.0 % 30.3  30.2  31.6   Platelets 150 - 400 K/uL 231  224  223       Latest Ref Rng & Units 09/23/2022    8:44 AM 08/14/2022    9:14 AM 07/17/2022    9:16 AM  CMP  Glucose 70 - 99 mg/dL 387  564  332   BUN 6 - 20 mg/dL 10  17  17    Creatinine 0.44 - 1.00 mg/dL 9.51  8.84  1.66   Sodium 135 - 145 mmol/L 138  137  138   Potassium 3.5 - 5.1 mmol/L 3.6  3.9  3.9   Chloride 98 - 111 mmol/L 107  105  106   CO2 22 - 32 mmol/L 24  26  25    Calcium 8.9 - 10.3 mg/dL 8.4  8.7  9.0   Total Protein 6.5 - 8.1 g/dL 6.7  7.0  7.2   Total Bilirubin 0.3 - 1.2 mg/dL 0.4  0.2  0.4    Alkaline Phos 38 - 126 U/L 97  97  92   AST 15 - 41 U/L 18  18  17    ALT 0 - 44 U/L 13  14  13        RADIOGRAPHIC STUDIES: I have personally reviewed the radiological images as listed and agreed with the findings in the report. No results found.

## 2022-09-23 NOTE — Assessment & Plan Note (Signed)
IV magnesium PRN if Mag <1.7 Continue Slow Mag 1 tab daily.  

## 2022-09-23 NOTE — Assessment & Plan Note (Signed)
Possibly due to hypocalcemia.  Recommend patient to resume calcium and vitamin D supplementation.

## 2022-10-13 ENCOUNTER — Other Ambulatory Visit: Payer: Medicare HMO

## 2022-10-14 ENCOUNTER — Ambulatory Visit: Payer: Medicare HMO | Admitting: Oncology

## 2022-10-14 ENCOUNTER — Inpatient Hospital Stay: Payer: Medicare HMO

## 2022-10-14 ENCOUNTER — Inpatient Hospital Stay (HOSPITAL_BASED_OUTPATIENT_CLINIC_OR_DEPARTMENT_OTHER): Payer: Medicare HMO | Admitting: Oncology

## 2022-10-14 ENCOUNTER — Other Ambulatory Visit: Payer: Medicare HMO

## 2022-10-14 ENCOUNTER — Inpatient Hospital Stay: Payer: Medicare HMO | Attending: Oncology

## 2022-10-14 ENCOUNTER — Ambulatory Visit: Payer: Medicare HMO

## 2022-10-14 ENCOUNTER — Encounter: Payer: Self-pay | Admitting: Oncology

## 2022-10-14 ENCOUNTER — Inpatient Hospital Stay: Payer: Medicare HMO | Admitting: Oncology

## 2022-10-14 VITALS — BP 110/57 | HR 67 | Temp 97.5°F | Resp 18 | Wt 223.8 lb

## 2022-10-14 VITALS — BP 122/72 | HR 70

## 2022-10-14 DIAGNOSIS — C50812 Malignant neoplasm of overlapping sites of left female breast: Secondary | ICD-10-CM | POA: Diagnosis not present

## 2022-10-14 DIAGNOSIS — D649 Anemia, unspecified: Secondary | ICD-10-CM | POA: Insufficient documentation

## 2022-10-14 DIAGNOSIS — Z23 Encounter for immunization: Secondary | ICD-10-CM | POA: Insufficient documentation

## 2022-10-14 DIAGNOSIS — Z171 Estrogen receptor negative status [ER-]: Secondary | ICD-10-CM

## 2022-10-14 DIAGNOSIS — Z9012 Acquired absence of left breast and nipple: Secondary | ICD-10-CM | POA: Insufficient documentation

## 2022-10-14 DIAGNOSIS — Z87891 Personal history of nicotine dependence: Secondary | ICD-10-CM | POA: Diagnosis not present

## 2022-10-14 DIAGNOSIS — M549 Dorsalgia, unspecified: Secondary | ICD-10-CM | POA: Insufficient documentation

## 2022-10-14 DIAGNOSIS — Z923 Personal history of irradiation: Secondary | ICD-10-CM | POA: Insufficient documentation

## 2022-10-14 DIAGNOSIS — C50912 Malignant neoplasm of unspecified site of left female breast: Secondary | ICD-10-CM | POA: Insufficient documentation

## 2022-10-14 DIAGNOSIS — E876 Hypokalemia: Secondary | ICD-10-CM

## 2022-10-14 DIAGNOSIS — M545 Low back pain, unspecified: Secondary | ICD-10-CM

## 2022-10-14 DIAGNOSIS — R252 Cramp and spasm: Secondary | ICD-10-CM

## 2022-10-14 DIAGNOSIS — Z5112 Encounter for antineoplastic immunotherapy: Secondary | ICD-10-CM | POA: Diagnosis present

## 2022-10-14 DIAGNOSIS — G8929 Other chronic pain: Secondary | ICD-10-CM

## 2022-10-14 LAB — CMP (CANCER CENTER ONLY)
ALT: 15 U/L (ref 0–44)
AST: 20 U/L (ref 15–41)
Albumin: 3.6 g/dL (ref 3.5–5.0)
Alkaline Phosphatase: 97 U/L (ref 38–126)
Anion gap: 6 (ref 5–15)
BUN: 13 mg/dL (ref 6–20)
CO2: 25 mmol/L (ref 22–32)
Calcium: 8.6 mg/dL — ABNORMAL LOW (ref 8.9–10.3)
Chloride: 103 mmol/L (ref 98–111)
Creatinine: 0.83 mg/dL (ref 0.44–1.00)
GFR, Estimated: >60 mL/min (ref >60)
Glucose, Bld: 100 mg/dL — ABNORMAL HIGH (ref 70–99)
Potassium: 4.1 mmol/L (ref 3.5–5.1)
Sodium: 134 mmol/L — ABNORMAL LOW (ref 135–145)
Total Bilirubin: 0.3 mg/dL (ref 0.3–1.2)
Total Protein: 6.8 g/dL (ref 6.5–8.1)

## 2022-10-14 LAB — CBC (CANCER CENTER ONLY)
HCT: 32.1 % — ABNORMAL LOW (ref 36.0–46.0)
Hemoglobin: 9.7 g/dL — ABNORMAL LOW (ref 12.0–15.0)
MCH: 25.8 pg — ABNORMAL LOW (ref 26.0–34.0)
MCHC: 30.2 g/dL (ref 30.0–36.0)
MCV: 85.4 fL (ref 80.0–100.0)
Platelet Count: 266 10*3/uL (ref 150–400)
RBC: 3.76 MIL/uL — ABNORMAL LOW (ref 3.87–5.11)
RDW: 14.7 % (ref 11.5–15.5)
WBC Count: 4.6 10*3/uL (ref 4.0–10.5)
nRBC: 0 % (ref 0.0–0.2)

## 2022-10-14 LAB — MAGNESIUM: Magnesium: 1.5 mg/dL — ABNORMAL LOW (ref 1.7–2.4)

## 2022-10-14 MED ORDER — ACETAMINOPHEN 325 MG PO TABS
650.0000 mg | ORAL_TABLET | Freq: Once | ORAL | Status: AC
  Administered 2022-10-14: 650 mg via ORAL
  Filled 2022-10-14: qty 2

## 2022-10-14 MED ORDER — HEPARIN SOD (PORK) LOCK FLUSH 100 UNIT/ML IV SOLN
500.0000 [IU] | Freq: Once | INTRAVENOUS | Status: AC | PRN
  Administered 2022-10-14: 500 [IU]
  Filled 2022-10-14: qty 5

## 2022-10-14 MED ORDER — SLOW-MAG 71.5-119 MG PO TBEC
1.0000 | DELAYED_RELEASE_TABLET | Freq: Two times a day (BID) | ORAL | 2 refills | Status: DC
Start: 2022-10-14 — End: 2023-04-28

## 2022-10-14 MED ORDER — INFLUENZA VIRUS VACC SPLIT PF (FLUZONE) 0.5 ML IM SUSY
0.5000 mL | PREFILLED_SYRINGE | Freq: Once | INTRAMUSCULAR | Status: AC
  Administered 2022-10-14: 0.5 mL via INTRAMUSCULAR
  Filled 2022-10-14: qty 0.5

## 2022-10-14 MED ORDER — DIPHENHYDRAMINE HCL 25 MG PO CAPS
50.0000 mg | ORAL_CAPSULE | Freq: Once | ORAL | Status: AC
  Administered 2022-10-14: 50 mg via ORAL
  Filled 2022-10-14: qty 2

## 2022-10-14 MED ORDER — MAGNESIUM SULFATE 2 GM/50ML IV SOLN
2.0000 g | Freq: Once | INTRAVENOUS | Status: AC
  Administered 2022-10-14: 2 g via INTRAVENOUS
  Filled 2022-10-14: qty 50

## 2022-10-14 MED ORDER — TRASTUZUMAB-ANNS CHEMO 150 MG IV SOLR
6.0000 mg/kg | Freq: Once | INTRAVENOUS | Status: AC
  Administered 2022-10-14: 600 mg via INTRAVENOUS
  Filled 2022-10-14: qty 28.57

## 2022-10-14 MED ORDER — SODIUM CHLORIDE 0.9 % IV SOLN
420.0000 mg | Freq: Once | INTRAVENOUS | Status: AC
  Administered 2022-10-14: 420 mg via INTRAVENOUS
  Filled 2022-10-14: qty 14

## 2022-10-14 MED ORDER — SODIUM CHLORIDE 0.9 % IV SOLN
Freq: Once | INTRAVENOUS | Status: AC
  Filled 2022-10-14: qty 250

## 2022-10-14 NOTE — Assessment & Plan Note (Signed)
IV magnesium PRN if Mag <1.7 Increase Slow Mag 1 tab BID.

## 2022-10-14 NOTE — Progress Notes (Signed)
Patient here for follow up. Reports that she has been having severe pain to shoulders ribs and arms. She ended up going to urgent care and was prescribed a muscle relaxant.

## 2022-10-14 NOTE — Assessment & Plan Note (Signed)
Possibly due to hypocalcemia.  Recommend patient to resume calcium and vitamin D supplementation.

## 2022-10-14 NOTE — Assessment & Plan Note (Addendum)
Chronic issue for her.  Xray was not done, per patient her pain doctor has done the xray.  Result was scanned in media.  Lumbar xray showed stable spondylosis and spondylolisthesis.  She has multiple bone pain, - will get bone scan.

## 2022-10-14 NOTE — Patient Instructions (Signed)
[place] [facility]  Discharge Instructions: Thank you for choosing [facility] to provide your oncology and hematology care.  If you have a lab appointment with the Cancer Center, please go directly to the Cancer Center and check in at the registration area.  Wear comfortable clothing and clothing appropriate for easy access to any Portacath or PICC line.   We strive to give you quality time with your provider. You may need to reschedule your appointment if you arrive late (15 or more minutes).  Arriving late affects you and other patients whose appointments are after yours.  Also, if you miss three or more appointments without notifying the office, you may be dismissed from the clinic at the provider's discretion.      For prescription refill requests, have your pharmacy contact our office and allow 72 hours for refills to be completed.    Today you received the following chemotherapy and/or immunotherapy agents Transtuzumab & Pertuzumab    To help prevent nausea and vomiting after your treatment, we encourage you to take your nausea medication as directed.  BELOW ARE SYMPTOMS THAT SHOULD BE REPORTED IMMEDIATELY: *FEVER GREATER THAN 100.4 F (38 C) OR HIGHER *CHILLS OR SWEATING *NAUSEA AND VOMITING THAT IS NOT CONTROLLED WITH YOUR NAUSEA MEDICATION *UNUSUAL SHORTNESS OF BREATH *UNUSUAL BRUISING OR BLEEDING *URINARY PROBLEMS (pain or burning when urinating, or frequent urination) *BOWEL PROBLEMS (unusual diarrhea, constipation, pain near the anus) TENDERNESS IN MOUTH AND THROAT WITH OR WITHOUT PRESENCE OF ULCERS (sore throat, sores in mouth, or a toothache) UNUSUAL RASH, SWELLING OR PAIN  UNUSUAL VAGINAL DISCHARGE OR ITCHING   Items with * indicate a potential emergency and should be followed up as soon as possible or go to the Emergency Department if any problems should occur.  Please show the CHEMOTHERAPY ALERT CARD or IMMUNOTHERAPY ALERT CARD  at check-in to the Emergency Department and triage nurse.  Should you have questions after your visit or need to cancel or reschedule your appointment, please contact [place] [facility]  [phone] and follow the prompts.  Office hours are 8:00 a.m. to 4:30 p.m. Monday - Friday. Please note that voicemails left after 4:00 p.m. may not be returned until the following business day.  We are closed weekends and major holidays. You have access to a nurse at all times for urgent questions. Please call the main number to the clinic [phone] and follow the prompts.  For any non-urgent questions, you may also contact your provider using MyChart. We now offer e-Visits for anyone 31 and older to request care online for non-urgent symptoms. For details visit [email or web address].   Also download the MyChart app! Go to the app store, search "MyChart", open the app, select , and log in with your MyChart username and password.

## 2022-10-14 NOTE — Assessment & Plan Note (Signed)
Hemoglobin is stable. Monitor counts.

## 2022-10-14 NOTE — Assessment & Plan Note (Signed)
continue potassium chloride 20meq twice daily.  

## 2022-10-14 NOTE — Progress Notes (Signed)
Hematology/Oncology Progress note [phone] [phone]     CHIEF COMPLAINTS/REASON FOR VISIT:  Follow-up for Stage IIIB left breast cancer, ER-, HER2+  ASSESSMENT & PLAN:   Cancer Staging  Breast cancer [facility]) Staging form: Breast, AJCC 8th Edition - Clinical stage from 08/21/2021: Stage IIIB (cT4b, cN3c, cM0, G3, ER-, PR-, HER2+) - Signed by [name], MD on 09/12/2021  Breast cancer (HCC) Left breast cancer, cT4b N3 Mx, ER/PR-, HER2 + S/p neoadjuvant chemotherapy 6 cycles of TCHP  S/p left mastectomy + SLNB -ypT0 ypN0, complete pathological response.  Recommend adjuvant Transtuzumab and Pertuzumab to complete 1 year treatment if she tolerates.  S/p adjuvant radiation- last RT 7/1  Labs reviewed and discussed with patient.   Proceed with trastuzumab and pertuzumab today.   April 2024 echocardiogram- stable LVEF- repeat echo   Recommend adjuvant Zometa- she declined.    Normocytic anemia Hemoglobin is stable. Monitor counts.   Back pain Chronic issue for her.  Xray was not done, per patient her pain doctor has done the xray.  Result was scanned in media.  Lumbar xray showed stable spondylosis and spondylolisthesis.  She has multiple bone pain, - will get bone scan.      Hypomagnesemia IV magnesium PRN if Mag <1.7 Increase Slow Mag 1 tab BID.   Hypokalemia continue potassium chloride twice daily.   Muscle cramps Possibly due to hypocalcemia.  Recommend patient to resume calcium and vitamin D supplementation.    Orders Placed This Encounter  Procedures   NM Bone Scan Whole Body    Standing Status:   Future    Standing Expiration Date:   10/14/2023    Order Specific Question:   If indicated for the ordered procedure, I authorize the administration of a radiopharmaceutical per Radiology protocol    Answer:   Yes    Order Specific Question:   Is the patient pregnant?    Answer:   No    Order Specific Question:   Preferred imaging  location?    Answer:   [facility]   CBC (Cancer Center Only)    Standing Status:   Future    Standing Expiration Date:   11/04/2023   CMP (Cancer Center only)    Standing Status:   Future    Standing Expiration Date:   11/04/2023   Magnesium    Standing Status:   Future    Standing Expiration Date:   11/04/2023   CBC (Cancer Center Only)    Standing Status:   Future    Standing Expiration Date:   11/25/2023   CMP (Cancer Center only)    Standing Status:   Future    Standing Expiration Date:   11/25/2023   Magnesium    Standing Status:   Future    Standing Expiration Date:   11/25/2023   CBC (Cancer Center Only)    Standing Status:   Future    Standing Expiration Date:   12/16/2023   CMP (Cancer Center only)    Standing Status:   Future    Standing Expiration Date:   12/16/2023   Magnesium    Standing Status:   Future    Standing Expiration Date:   12/16/2023   Follow up in 3 weeks.  All questions were answered. The patient knows to call the clinic with any problems questions or concerns.  Return of visit:  3 weeks lab MD Transtuzumab + Pertuzumab +/- IV magnesium  [name], MD, PhD [facility] Hematology Oncology  10/14/2022      HISTORY OF PRESENTING ILLNESS:  Vanessa Fisher is a  62 y.o.  female with PMH listed below who was referred to me for evaluation of  left breast cancer SUMMARY OF ONCOLOGIC HISTORY: Oncology History  Breast cancer (HCC)  07/31/2021 Imaging   Bilateral diagnostic mammogram and US showed 5 centimeter LEFT breast mass associated with pleomorphic calcifications is suspicious for invasive ductal carcinoma.At least 4 LEFT axillary lymph nodes with abnormal morphology   08/21/2021 Cancer Staging   Staging form: Breast, AJCC 8th Edition - Clinical: Stage IIIB (cT4b, cN3c, cM0, G3, ER-, PR-, HER2+) - Signed by [name], MD on 08/28/2021 Histologic grading system: 3 grade system  -08/21/21 left breast ultrasound-guided biopsy showed invasive  mammary carcinoma, grade 3, ER/PR negative, HER2 positive.  Left axillary lymph node biopsy positive for macro metastatic mammary carcinoma, 8 mm in greatest extent.   08/30/2021 Imaging   MRI brain w wo contrast -No metastatic disease or acute intracranial abnormality. Essentially normal for age MRI appearance of the brain.    09/03/2021 Echocardiogram   1. Left ventricular ejection fraction, by estimation, is 55 to 60%. Left ventricular ejection fraction by 3D volume is 58 %. The left ventricle has normal function. The left ventricle has no regional wall motion abnormalities. There is mild left  ventricular hypertrophy. Left ventricular diastolic parameters were normal.  2. Right ventricular systolic function is normal. The right ventricular size is normal.  3. The mitral valve is normal in structure. No evidence of mitral valve regurgitation.  4. The aortic valve was not well visualized. Aortic valve regurgitation is not visualized   09/10/2021 Imaging   PET scan showed Large hypermetabolic left breast mass and diffuse skin thickening,consistent with primary breast carcinoma. Hypermetabolic lymphadenopathy in the left axilla, left subpectoral region, and left supraclavicular region, consistent with metastatic disease. No evidence of metastatic disease within the abdomen or pelvis   09/12/2021 - 02/13/2022 Chemotherapy   Patient is on Treatment Plan : BREAST  Docetaxel + Carboplatin + Trastuzumab + Pertuzumab  (TCHP) q21d       Genetic Testing   Negative genetic testing. No pathogenic variants identified on the [facility] Common Hereditary Cancers+RNA panel. The report date is 10/26/2021.  The Common Hereditary Cancers Panel + RNA offered by [facility] includes sequencing and/or deletion duplication testing of the following 47 genes: APC, ATM, AXIN2, BARD1, BMPR1A, BRCA1, BRCA2, BRIP1, CDH1, CDKN2A (p14ARF), CDKN2A (p16INK4a), CKD4, CHEK2, CTNNA1, DICER1, EPCAM (Deletion/duplication testing only),  GREM1 (promoter region deletion/duplication testing only), KIT, MEN1, MLH1, MSH2, MSH3, MSH6, MUTYH, NBN, NF1, NHTL1, PALB2, PDGFRA, PMS2, POLD1, POLE, PTEN, RAD50, RAD51C, RAD51D, SDHB, SDHC, SDHD, SMAD4, SMARCA4. STK11, TP53, TSC1, TSC2, and VHL.  The following genes were evaluated for sequence changes only: SDHA and HOXB13 c.251G>A variant only.   02/09/2022 Echocardiogram   LVEF 55 to 60%    03/27/2022 Surgery   S/p left mastectomy + SLNB ypT0 ypN0,    04/24/2022 -  Chemotherapy   Patient is on Treatment Plan : BREAST Trastuzumab  + Pertuzumab q21d x 13 cycles     05/06/2022 Echocardiogram   LVEF 55 to 60%    07/02/2022 - 08/03/2022 Radiation Therapy   Adjuvant breast radiation.     + chronic nasal pressure, congestion, horse voice she attributes to sinusitis.   INTERVAL HISTORY Vanessa Fisher is a 62 y.o. female who has above history reviewed by me today presents for follow up visit for ER negative HER2 positive breast  cancer treatment.  She has no showed to her July chemotherapy appointment x 2 times. She denies any diarrhea episodes since last visit.Marland Kitchen No fever or chills.  + lower extremity cramps.  + Reports that she has been having severe pain to shoulders ribs and arms. She ended up going to urgent care and was prescribed a muscle relaxant.   MEDICAL HISTORY:  Past Medical History:  Diagnosis Date   Anemia    Anxiety    Arthritis    Asthma    WELL CONTROLLED   Bile acid malabsorption syndrome    Bradycardia    HAD AN ISSUE WITH THIS IN 2016-NO PROBLEMS SINCE   Breast cancer, left breast (HCC) 08/2021   Carpal tunnel syndrome on right    Depression    Diabetes mellitus without complication (HCC)    Diverticulitis    Gastric reflux    GERD (gastroesophageal reflux disease)    Hand pain 05/12/2016   Headache    H/O MIGRAINES   History of kidney stones    H/O   Lumbar radiculitis    Neck pain, chronic    Osteoarthritis of both knees    Panic attacks      SURGICAL HISTORY: Past Surgical History:  Procedure Laterality Date   BREAST BIOPSY Left 08/21/2021   Axilla Bx, Hydromarker, neg   BREAST BIOPSY Left 08/21/2021   [place] Bx, Ribbon Clip, invasive mammary carcinoma   BREAST BIOPSY Left 03/12/2022   [place] LT RADIO FREQUENCY TAG LOC US GUIDE 03/12/2022 [facility]   BREAST BIOPSY Left 03/12/2022   [place] LT RADIO FREQUENCY TAG EA ADD LESION LOC US GUIDE 03/12/2022 [facility]   CARPAL TUNNEL RELEASE Right 12/06/2017   Procedure: CARPAL TUNNEL RELEASE;  Surgeon: Deeann Saint, MD;  Location: [facility];  Service: Orthopedics;  Laterality: Right;   COLONOSCOPY WITH PROPOFOL N/A 06/11/2021   Procedure: COLONOSCOPY WITH PROPOFOL;  Surgeon: Toney Reil, MD;  Location: [facility];  Service: Gastroenterology;  Laterality: N/A;   DORSAL COMPARTMENT RELEASE Right 12/06/2017   Procedure: RELEASE DORSAL COMPARTMENT (DEQUERVAIN);  Surgeon: Deeann Saint, MD;  Location: [facility] OR[facility];  Service: Orthopedics;  Laterality: Right;   ESOPHAGOGASTRODUODENOSCOPY (EGD) WITH PROPOFOL N/A 06/11/2021   Procedure: ESOPHAGOGASTRODUODENOSCOPY (EGD) WITH PROPOFOL;  Surgeon: Toney Reil, MD;  Location: [facility] ENDOSCOPY;  Service: Gastroenterology;  Laterality: N/A;   PART MASTECTOMY,RADIO FREQUENCY LOCALIZER,AXILLARY SENTINEL NODE BIOPSY Left 03/27/2022   Procedure: PART MASTECTOMY,RADIO FREQUENCY LOCALIZER,AXILLARY SENTINEL NODE BIOPSY;  Surgeon: Carolan Shiver, MD;  Location: [facility];  Service: General;  Laterality: Left;   PARTIAL HYSTERECTOMY     PORTACATH PLACEMENT N/A 09/24/2021   Procedure: INSERTION PORT-A-CATH;  Surgeon: Carolan Shiver, MD;  Location: [facility]S;  Service: General;  Laterality: N/A;   TUBAL LIGATION      SOCIAL HISTORY: Social History   Socioeconomic History   Marital status: Single    Spouse name: Not on file   Number of children: Not on file   Years of education: Not on file   Highest education level: Not on file   Occupational History   Not on file  Tobacco Use   Smoking status: Former    Types: Cigarettes   Smokeless tobacco: Never  Vaping Use   Vaping status: Never Used  Substance and Sexual Activity   Alcohol use: No   Drug use: No   Sexual activity: Not on file  Other Topics Concern   Not on file  Social History Narrative   Lives alone   Social Determinants  of Health   Financial Resource Strain: High Risk (08/15/2021)   Overall Financial Resource Strain (CARDIA)    Difficulty of Paying Living Expenses: Very hard  Food Insecurity: Food Insecurity Present (09/09/2021)   Hunger Vital Sign    Worried About Running Out of Food in the Last Year: Often true    Ran Out of Food in the Last Year: Often true  Transportation Needs: No Transportation Needs (08/15/2021)   PRAPARE - Administrator, Civil Service (Medical): No    Lack of Transportation (Non-Medical): No  Physical Activity: Inactive (08/15/2021)   Exercise Vital Sign    Days of Exercise per Week: 0 days    Minutes of Exercise per Session: 0 min  Stress: Stress Concern Present (08/15/2021)   [employer] of Occupational Health - Occupational Stress Questionnaire    Feeling of Stress : Rather much  Social Connections: Socially Isolated (08/15/2021)   Social Connection and Isolation Panel [NHANES]    Frequency of Communication with Friends and Family: Once a week    Frequency of Social Gatherings with Friends and Family: Once a week    Attends Religious Services: Never    Database administrator or Organizations: No    Attends Engineer, structural: Not on file    Marital Status: Never married  Intimate Partner Violence: Not At Risk (08/15/2021)   Humiliation, Afraid, Rape, and Kick questionnaire    Fear of Current or Ex-Partner: No    Emotionally Abused: No    Physically Abused: No    Sexually Abused: No    FAMILY HISTORY: Family History  Problem Relation Age of Onset   Diabetes Mother    Hypertension  Mother    Heart failure Mother    Pancreatic cancer Mother 63   Diabetes Father    Hypertension Father    Breast cancer Maternal Aunt    Cervical cancer Maternal Aunt    Lung cancer Son 10    ALLERGIES:  is allergic to bc powder [aspirin-salicylamide-caffeine], doxycycline, gabapentin, hydrocodone-acetaminophen, latex, penicillin g, penicillins, and shellfish allergy.  MEDICATIONS:  Current Outpatient Medications  Medication Sig Dispense Refill   albuterol (PROVENTIL) (2.5 MG/3ML) 0.083% nebulizer solution Take 3 mLs (2.5 mg total) by nebulization every 4 (four) hours as needed for wheezing or shortness of breath. 75 mL 2   albuterol (VENTOLIN HFA) 108 (90 Base) MCG/ACT inhaler Inhale 2 puffs into the lungs every 6 (six) hours as needed for wheezing or shortness of breath. 18 g 0   atorvastatin (LIPITOR) 10 MG tablet Take 10 mg by mouth daily.     Azelastine HCl 137 MCG/SPRAY SOLN Place 1 spray into the nose daily. 30 mL 0   calcium carbonate (OS-CAL - DOSED IN MG OF ELEMENTAL CALCIUM) 1250 (500 Ca) MG tablet Take 1 tablet (1,250 mg total) by mouth daily. 90 tablet 1   cetirizine (ZYRTEC ALLERGY) 10 MG tablet Take 1 tablet (10 mg total) by mouth daily. 90 tablet 0   cyclobenzaprine (FLEXERIL) 10 MG tablet Take 10 mg by mouth 3 (three) times daily.     escitalopram (LEXAPRO) 10 MG tablet Take 1 tablet (10 mg total) by mouth daily. 30 tablet 1   esomeprazole (NEXIUM) 40 MG capsule Take 1 capsule (40 mg total) by mouth daily at 12 noon. 30 capsule 3   FLOVENT DISKUS 250 MCG/ACT AEPB Inhale 1 puff into the lungs daily.     fluticasone (FLONASE) 50 MCG/ACT nasal spray Place 2 sprays into both  nostrils daily. 15.8 mL 0   Iron-Vitamin C 65-125 MG TABS Take 1 tablet by mouth daily at 2 PM. 30 tablet 2   KLOR-CON M20 20 MEQ tablet TAKE 1 TABLET BY MOUTH TWICE A DAY 60 tablet 1   lidocaine-prilocaine (EMLA) cream Apply 1 Application topically as needed. 30 g 12   magic mouthwash (multi-ingredient)  oral suspension Swish and spit 5-10 ml by mouth 4 times a day as needed 400 mL 1   montelukast (SINGULAIR) 10 MG tablet Take by mouth.     ondansetron (ZOFRAN) 8 MG tablet Take 1 tablet (8 mg total) by mouth 2 (two) times daily as needed (Nausea or vomiting). Start on the third day after chemotherapy. 30 tablet 1   prochlorperazine (COMPAZINE) 10 MG tablet Take 1 tablet (10 mg total) by mouth every 6 (six) hours as needed (Nausea or vomiting). 30 tablet 1   silver sulfADIAZINE (SILVADENE) 1 % cream Apply 1 Application topically daily. 50 g 1   tamsulosin (FLOMAX) 0.4 MG CAPS capsule Take 0.4 mg by mouth daily.     zinc gluconate 50 MG tablet Take 50 mg by mouth as needed.     diphenoxylate-atropine (LOMOTIL) 2.5-0.025 MG tablet Take 1 tablet by mouth 4 (four) times daily as needed for diarrhea or loose stools. (Patient not taking: Reported on 10/14/2022) 120 tablet 0   lidocaine (LIDODERM) 5 % 2 patches daily. (Patient not taking: Reported on 10/14/2022)     magnesium chloride (SLOW-MAG) 64 MG TBEC SR tablet Take 1 tablet (64 mg total) by mouth 2 (two) times daily. 60 tablet 2   naloxone (NARCAN) nasal spray 4 mg/0.1 mL  (Patient not taking: Reported on 10/14/2022)     oxyCODONE-acetaminophen (PERCOCET/ROXICET) 5-325 MG tablet Take 1 tablet by mouth every 6 (six) hours as needed for pain. (Patient not taking: Reported on 10/14/2022)     No current facility-administered medications for this visit.   Facility-Administered Medications Ordered in Other Visits  Medication Dose Route Frequency Provider Last Rate Last Admin   magnesium sulfate IVPB 2 g 50 mL  2 g Intravenous Once Covington, Sarah M, PA-C        Review of Systems  Constitutional:  Negative for appetite change, chills, fatigue and fever.  HENT:   Negative for hearing loss and voice change.   Eyes:  Negative for eye problems.  Respiratory:  Negative for chest tightness and cough.   Cardiovascular:  Negative for chest pain.   Gastrointestinal:  Negative for abdominal distention, abdominal pain, blood in stool and diarrhea.  Endocrine: Negative for hot flashes.  Genitourinary:  Negative for difficulty urinating and frequency.   Musculoskeletal:  Positive for arthralgias and back pain.       Leg cramps  Skin:  Negative for itching and rash.  Neurological:  Negative for extremity weakness and headaches.  Hematological:  Negative for adenopathy.  Psychiatric/Behavioral:  Negative for confusion.      PHYSICAL EXAMINATION: ECOG PERFORMANCE STATUS: 1 - Symptomatic but completely ambulatory Vitals:   10/14/22 0902  BP: (!) 110/57  Pulse: 67  Resp: 18  Temp: (!) 97.5 F (36.4 C)   Filed Weights   10/14/22 0902  Weight: 223 lb 12.8 oz (101.5 kg)    Physical Exam Constitutional:      General: She is not in acute distress.    Appearance: She is not diaphoretic.  HENT:     Head: Normocephalic and atraumatic.  Eyes:     General: No scleral icterus.  Pupils: Pupils are equal, round, and reactive to light.  Cardiovascular:     Rate and Rhythm: Normal rate and regular rhythm.     Heart sounds: No murmur heard. Pulmonary:     Effort: Pulmonary effort is normal. No respiratory distress.     Breath sounds: No wheezing.  Abdominal:     General: There is no distension.     Palpations: Abdomen is soft.     Tenderness: There is no abdominal tenderness.  Musculoskeletal:        General: Normal range of motion.     Cervical back: Normal range of motion and neck supple.  Skin:    General: Skin is warm and dry.     Findings: No erythema.  Neurological:     Mental Status: She is alert and oriented to person, place, and time.     Cranial Nerves: No cranial nerve deficit.     Motor: No abnormal muscle tone.     Coordination: Coordination normal.  Psychiatric:        Mood and Affect: Affect normal.        LABORATORY DATA:  I have reviewed the data as listed    Latest Ref Rng & Units 10/14/2022     8:18 AM 09/23/2022    8:44 AM 08/14/2022    9:14 AM  CBC  WBC 4.0 - 10.5 K/uL 4.6  3.8  4.2   Hemoglobin 12.0 - 15.0 g/dL 9.7  9.4  9.5   Hematocrit 36.0 - 46.0 % 32.1  30.3  30.2   Platelets 150 - 400 K/uL 266  231  224       Latest Ref Rng & Units 10/14/2022    8:18 AM 09/23/2022    8:44 AM 08/14/2022    9:14 AM  CMP  Glucose 70 - 99 mg/dL 161  096  045   BUN 6 - 20 mg/dL 13  10  17    Creatinine 0.44 - 1.00 mg/dL 4.09  8.11  9.14   Sodium 135 - 145 mmol/L 134  138  137   Potassium 3.5 - 5.1 mmol/L 4.1  3.6  3.9   Chloride 98 - 111 mmol/L 103  107  105   CO2 22 - 32 mmol/L 25  24  26    Calcium 8.9 - 10.3 mg/dL 8.6  8.4  8.7   Total Protein 6.5 - 8.1 g/dL 6.8  6.7  7.0   Total Bilirubin 0.3 - 1.2 mg/dL 0.3  0.4  0.2   Alkaline Phos 38 - 126 U/L 97  97  97   AST 15 - 41 U/L 20  18  18    ALT 0 - 44 U/L 15  13  14        RADIOGRAPHIC STUDIES: I have personally reviewed the radiological images as listed and agreed with the findings in the report. No results found.

## 2022-10-14 NOTE — Assessment & Plan Note (Addendum)
Left breast cancer, cT4b N3 Mx, ER/PR-, HER2 + S/p neoadjuvant chemotherapy 6 cycles of TCHP  S/p left mastectomy + SLNB -ypT0 ypN0, complete pathological response.  Recommend adjuvant Transtuzumab and Pertuzumab to complete 1 year treatment if she tolerates.  S/p adjuvant radiation- last RT 7/1  Labs reviewed and discussed with patient.   Proceed with trastuzumab and pertuzumab today.   April 2024 echocardiogram- stable LVEF- repeat echo   Recommend adjuvant Zometa- she declined.

## 2022-10-15 ENCOUNTER — Ambulatory Visit
Admission: RE | Admit: 2022-10-15 | Discharge: 2022-10-15 | Disposition: A | Discharge status: Home / Self Care | Payer: Medicare HMO | Source: Ambulatory Visit | Attending: Oncology | Admitting: Oncology

## 2022-10-15 DIAGNOSIS — Z5181 Encounter for therapeutic drug level monitoring: Secondary | ICD-10-CM | POA: Diagnosis not present

## 2022-10-15 DIAGNOSIS — Z79899 Other long term (current) drug therapy: Secondary | ICD-10-CM | POA: Diagnosis not present

## 2022-10-15 DIAGNOSIS — Z171 Estrogen receptor negative status [ER-]: Secondary | ICD-10-CM | POA: Insufficient documentation

## 2022-10-15 DIAGNOSIS — C50911 Malignant neoplasm of unspecified site of right female breast: Secondary | ICD-10-CM | POA: Insufficient documentation

## 2022-10-15 LAB — ECHOCARDIOGRAM COMPLETE
AR max vel: 2.3 cm2
AV Area VTI: 2.44 cm2
AV Area mean vel: 2.06 cm2
AV Mean grad: 6 mmHg
AV Peak grad: 9.4 mmHg
Ao pk vel: 1.53 m/s
Area-P 1/2: 3.5 cm2
MV VTI: 2.72 cm2
S' Lateral: 2.6 cm

## 2022-10-15 NOTE — Progress Notes (Signed)
*  PRELIMINARY RESULTS* Echocardiogram 2D Echocardiogram has been performed.  Cristela Blue 10/15/2022, 11:54 AM

## 2022-10-20 ENCOUNTER — Encounter
Admission: RE | Admit: 2022-10-20 | Discharge: 2022-10-20 | Disposition: A | Discharge status: Home / Self Care | Payer: Medicare HMO | Source: Ambulatory Visit | Attending: Oncology | Admitting: Oncology

## 2022-10-20 DIAGNOSIS — C50812 Malignant neoplasm of overlapping sites of left female breast: Secondary | ICD-10-CM | POA: Insufficient documentation

## 2022-10-20 DIAGNOSIS — M545 Low back pain, unspecified: Secondary | ICD-10-CM | POA: Insufficient documentation

## 2022-10-20 DIAGNOSIS — Z171 Estrogen receptor negative status [ER-]: Secondary | ICD-10-CM | POA: Insufficient documentation

## 2022-10-20 DIAGNOSIS — G8929 Other chronic pain: Secondary | ICD-10-CM | POA: Diagnosis present

## 2022-10-20 MED ORDER — TECHNETIUM TC 99M MEDRONATE IV KIT
20.0000 | PACK | Freq: Once | INTRAVENOUS | Status: AC | PRN
  Administered 2022-10-20: 20.43 via INTRAVENOUS

## 2022-10-27 ENCOUNTER — Encounter: Payer: Self-pay | Admitting: Internal Medicine

## 2022-10-29 ENCOUNTER — Encounter: Payer: Self-pay | Admitting: Oncology

## 2022-11-04 ENCOUNTER — Inpatient Hospital Stay (HOSPITAL_BASED_OUTPATIENT_CLINIC_OR_DEPARTMENT_OTHER): Payer: Medicare HMO | Admitting: Oncology

## 2022-11-04 ENCOUNTER — Inpatient Hospital Stay: Payer: Medicare HMO | Attending: Oncology

## 2022-11-04 ENCOUNTER — Inpatient Hospital Stay: Payer: Medicare HMO

## 2022-11-04 ENCOUNTER — Telehealth: Payer: Self-pay

## 2022-11-04 ENCOUNTER — Other Ambulatory Visit: Payer: Self-pay

## 2022-11-04 ENCOUNTER — Encounter: Payer: Self-pay | Admitting: Oncology

## 2022-11-04 VITALS — BP 116/46 | HR 72 | Temp 97.1°F | Resp 18 | Wt 225.3 lb

## 2022-11-04 DIAGNOSIS — Z171 Estrogen receptor negative status [ER-]: Secondary | ICD-10-CM | POA: Insufficient documentation

## 2022-11-04 DIAGNOSIS — C50911 Malignant neoplasm of unspecified site of right female breast: Secondary | ICD-10-CM

## 2022-11-04 DIAGNOSIS — G8929 Other chronic pain: Secondary | ICD-10-CM

## 2022-11-04 DIAGNOSIS — R252 Cramp and spasm: Secondary | ICD-10-CM

## 2022-11-04 DIAGNOSIS — C50812 Malignant neoplasm of overlapping sites of left female breast: Secondary | ICD-10-CM

## 2022-11-04 DIAGNOSIS — M549 Dorsalgia, unspecified: Secondary | ICD-10-CM | POA: Diagnosis not present

## 2022-11-04 DIAGNOSIS — C50912 Malignant neoplasm of unspecified site of left female breast: Secondary | ICD-10-CM | POA: Diagnosis present

## 2022-11-04 DIAGNOSIS — D649 Anemia, unspecified: Secondary | ICD-10-CM | POA: Insufficient documentation

## 2022-11-04 DIAGNOSIS — E876 Hypokalemia: Secondary | ICD-10-CM | POA: Insufficient documentation

## 2022-11-04 DIAGNOSIS — Z5112 Encounter for antineoplastic immunotherapy: Secondary | ICD-10-CM | POA: Insufficient documentation

## 2022-11-04 DIAGNOSIS — M545 Low back pain, unspecified: Secondary | ICD-10-CM

## 2022-11-04 LAB — CBC (CANCER CENTER ONLY)
HCT: 31.7 % — ABNORMAL LOW (ref 36.0–46.0)
Hemoglobin: 10.1 g/dL — ABNORMAL LOW (ref 12.0–15.0)
MCH: 26.6 pg (ref 26.0–34.0)
MCHC: 31.9 g/dL (ref 30.0–36.0)
MCV: 83.4 fL (ref 80.0–100.0)
Platelet Count: 227 10*3/uL (ref 150–400)
RBC: 3.8 MIL/uL — ABNORMAL LOW (ref 3.87–5.11)
RDW: 14.8 % (ref 11.5–15.5)
WBC Count: 4.7 10*3/uL (ref 4.0–10.5)
nRBC: 0 % (ref 0.0–0.2)

## 2022-11-04 LAB — CMP (CANCER CENTER ONLY)
ALT: 16 U/L (ref 0–44)
AST: 21 U/L (ref 15–41)
Albumin: 3.8 g/dL (ref 3.5–5.0)
Alkaline Phosphatase: 96 U/L (ref 38–126)
Anion gap: 9 (ref 5–15)
BUN: 18 mg/dL (ref 6–20)
CO2: 24 mmol/L (ref 22–32)
Calcium: 9 mg/dL (ref 8.9–10.3)
Chloride: 104 mmol/L (ref 98–111)
Creatinine: 0.9 mg/dL (ref 0.44–1.00)
GFR, Estimated: >60 mL/min (ref >60)
Glucose, Bld: 109 mg/dL — ABNORMAL HIGH (ref 70–99)
Potassium: 3.8 mmol/L (ref 3.5–5.1)
Sodium: 137 mmol/L (ref 135–145)
Total Bilirubin: 0.5 mg/dL (ref 0.3–1.2)
Total Protein: 7.3 g/dL (ref 6.5–8.1)

## 2022-11-04 LAB — MAGNESIUM: Magnesium: 1.5 mg/dL — ABNORMAL LOW (ref 1.7–2.4)

## 2022-11-04 MED ORDER — MAGNESIUM SULFATE 2 GM/50ML IV SOLN
2.0000 g | Freq: Once | INTRAVENOUS | Status: AC
  Administered 2022-11-04: 2 g via INTRAVENOUS

## 2022-11-04 MED ORDER — SODIUM CHLORIDE 0.9 % IV SOLN
420.0000 mg | Freq: Once | INTRAVENOUS | Status: AC
  Administered 2022-11-04: 420 mg via INTRAVENOUS
  Filled 2022-11-04: qty 14

## 2022-11-04 MED ORDER — SODIUM CHLORIDE 0.9 % IV SOLN
Freq: Once | INTRAVENOUS | Status: AC
  Filled 2022-11-04: qty 250

## 2022-11-04 MED ORDER — TRASTUZUMAB-ANNS CHEMO 150 MG IV SOLR
6.0000 mg/kg | Freq: Once | INTRAVENOUS | Status: AC
  Administered 2022-11-04: 600 mg via INTRAVENOUS
  Filled 2022-11-04: qty 28.57

## 2022-11-04 MED ORDER — HEPARIN SOD (PORK) LOCK FLUSH 100 UNIT/ML IV SOLN
500.0000 [IU] | Freq: Once | INTRAVENOUS | Status: AC | PRN
  Administered 2022-11-04: 500 [IU]
  Filled 2022-11-04: qty 5

## 2022-11-04 MED ORDER — DIPHENHYDRAMINE HCL 25 MG PO CAPS
50.0000 mg | ORAL_CAPSULE | Freq: Once | ORAL | Status: AC
  Administered 2022-11-04: 50 mg via ORAL
  Filled 2022-11-04: qty 2

## 2022-11-04 MED ORDER — ACETAMINOPHEN 325 MG PO TABS
650.0000 mg | ORAL_TABLET | Freq: Once | ORAL | Status: AC
  Administered 2022-11-04: 650 mg via ORAL
  Filled 2022-11-04: qty 2

## 2022-11-04 NOTE — Telephone Encounter (Signed)
Called patient and informed that her bone scan showed no metastatic bone disease.  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  There is a focal uptake in the left breast which could be secondary to postsurgical changes.  I recommend unilateral diagnostic mammogram left breast to make sure there is no residual tumor. I have ordered mammogram and [place] and will have Morrie Sheldon schedule.

## 2022-11-04 NOTE — Assessment & Plan Note (Addendum)
Left breast cancer, cT4b N3 Mx, ER/PR-, HER2 + S/p neoadjuvant chemotherapy 6 cycles of TCHP  S/p left mastectomy + SLNB -ypT0 ypN0, complete pathological response.  Recommend adjuvant Transtuzumab and Pertuzumab to complete 1 year treatment if she tolerates.  S/p adjuvant radiation- last RT 7/1  Labs reviewed and discussed with patient.   Proceed with trastuzumab and pertuzumab today.   Sept 2024 echocardiogram- stable LVEF- repeat echo in Nov/Dec  Recommend adjuvant Zometa- she declined.  Focal uptake of the left breast on bone scan.  Possible scarring tissue.  Repeat left unilateral mammogram.

## 2022-11-04 NOTE — Assessment & Plan Note (Signed)
Vanessa Fisher has improved.  She continues to have symptoms. calcium and vitamin D supplementation.  Continue

## 2022-11-04 NOTE — Assessment & Plan Note (Signed)
IV magnesium PRN if Mag <1.7 Increase Slow Mag 1 tab BID.

## 2022-11-04 NOTE — Assessment & Plan Note (Signed)
continue potassium chloride 20meq twice daily.  

## 2022-11-04 NOTE — Assessment & Plan Note (Signed)
Chronic issue for her.  Xray was not done, per patient her pain doctor has done the xray.  Result was scanned in media.  Lumbar xray showed stable spondylosis and spondylolisthesis.  She has multiple bone pain, - will get bone scan-negative for metastatic disease.  Patient has degenerative disease.

## 2022-11-04 NOTE — Patient Instructions (Signed)
[facility]  Discharge Instructions: Thank you for choosing [facility] to provide your oncology and hematology care.  If you have a lab appointment with the Cancer Center, please go directly to the Cancer Center and check in at the registration area.  Wear comfortable clothing and clothing appropriate for easy access to any Portacath or PICC line.   We strive to give you quality time with your provider. You may need to reschedule your appointment if you arrive late (15 or more minutes).  Arriving late affects you and other patients whose appointments are after yours.  Also, if you miss three or more appointments without notifying the office, you may be dismissed from the clinic at the provider's discretion.      For prescription refill requests, have your pharmacy contact our office and allow 72 hours for refills to be completed.    Today you received the following chemotherapy and/or immunotherapy agents KANJINTI and PERJETA      To help prevent nausea and vomiting after your treatment, we encourage you to take your nausea medication as directed.  BELOW ARE SYMPTOMS THAT SHOULD BE REPORTED IMMEDIATELY: *FEVER GREATER THAN 100.4 F (38 C) OR HIGHER *CHILLS OR SWEATING *NAUSEA AND VOMITING THAT IS NOT CONTROLLED WITH YOUR NAUSEA MEDICATION *UNUSUAL SHORTNESS OF BREATH *UNUSUAL BRUISING OR BLEEDING *URINARY PROBLEMS (pain or burning when urinating, or frequent urination) *BOWEL PROBLEMS (unusual diarrhea, constipation, pain near the anus) TENDERNESS IN MOUTH AND THROAT WITH OR WITHOUT PRESENCE OF ULCERS (sore throat, sores in mouth, or a toothache) UNUSUAL RASH, SWELLING OR PAIN  UNUSUAL VAGINAL DISCHARGE OR ITCHING   Items with * indicate a potential emergency and should be followed up as soon as possible or go to the Emergency Department if any problems should occur.  Please show the CHEMOTHERAPY ALERT CARD or IMMUNOTHERAPY ALERT CARD at  check-in to the Emergency Department and triage nurse.  Should you have questions after your visit or need to cancel or reschedule your appointment, please contact [facility]  [phone] and follow the prompts.  Office hours are 8:00 a.m. to 4:30 p.m. Monday - Friday. Please note that voicemails left after 4:00 p.m. may not be returned until the following business day.  We are closed weekends and major holidays. You have access to a nurse at all times for urgent questions. Please call the main number to the clinic [phone] and follow the prompts.  For any non-urgent questions, you may also contact your provider using MyChart. We now offer e-Visits for anyone 60 and older to request care online for non-urgent symptoms. For details visit [email or web address].   Also download the MyChart app! Go to the app store, search "MyChart", open the app, select , and log in with your MyChart username and password.   Trastuzumab Injection What is this medication? TRASTUZUMAB (tras TOO zoo mab) treats breast cancer and stomach cancer. It works by blocking a protein that causes cancer cells to grow and multiply. This helps to slow or stop the spread of cancer cells. This medicine may be used for other purposes; ask your health care provider or pharmacist if you have questions. COMMON BRAND NAME(S): Herceptin, Marlowe Alt, Ontruzant, Trazimera What should I tell my care team before I take this medication? They need to know if you have any of these conditions: Heart failure Lung disease An unusual or allergic reaction to trastuzumab, other medications, foods, dyes, or preservatives Pregnant  or trying to get pregnant Breast-feeding How should I use this medication? This medication is injected into a vein. It is given by your care team in a hospital or clinic setting. Talk to your care team about the use of this medication in children. It is not  approved for use in children. Overdosage: If you think you have taken too much of this medicine contact a poison control center or emergency room at once. NOTE: This medicine is only for you. Do not share this medicine with others. What if I miss a dose? Keep appointments for follow-up doses. It is important not to miss your dose. Call your care team if you are unable to keep an appointment. What may interact with this medication? Certain types of chemotherapy, such as daunorubicin, doxorubicin, epirubicin, idarubicin This list may not describe all possible interactions. Give your health care provider a list of all the medicines, herbs, non-prescription drugs, or dietary supplements you use. Also tell them if you smoke, drink alcohol, or use illegal drugs. Some items may interact with your medicine. What should I watch for while using this medication? Your condition will be monitored carefully while you are receiving this medication. This medication may make you feel generally unwell. This is not uncommon, as chemotherapy affects healthy cells as well as cancer cells. Report any side effects. Continue your course of treatment even though you feel ill unless your care team tells you to stop. This medication may increase your risk of getting an infection. Call your care team for advice if you get a fever, chills, sore throat, or other symptoms of a cold or flu. Do not treat yourself. Try to avoid being around people who are sick. Avoid taking medications that contain aspirin, acetaminophen, ibuprofen, naproxen, or ketoprofen unless instructed by your care team. These medications can hide a fever. Talk to your care team if you may be pregnant. Serious birth defects can occur if you take this medication during pregnancy and for 7 months after the last dose. You will need a negative pregnancy test before starting this medication. Contraception is recommended while taking this medication and for 7 months  after the last dose. Your care team can help you find the option that works for you. Do not breastfeed while taking this medication and for 7 months after stopping treatment. What side effects may I notice from receiving this medication? Side effects that you should report to your care team as soon as possible: Allergic reactions or angioedema--skin rash, itching or hives, swelling of the face, eyes, lips, tongue, arms, or legs, trouble swallowing or breathing Dry cough, shortness of breath or trouble breathing Heart failure--shortness of breath, swelling of the ankles, feet, or hands, sudden weight gain, unusual weakness or fatigue Infection--fever, chills, cough, or sore throat Infusion reactions--chest pain, shortness of breath or trouble breathing, feeling faint or lightheaded Side effects that usually do not require medical attention (report to your care team if they continue or are bothersome): Diarrhea Dizziness Headache Nausea Trouble sleeping Vomiting This list may not describe all possible side effects. Call your doctor for medical advice about side effects. You may report side effects to FDA at 1-800-FDA-1088. Where should I keep my medication? This medication is given in a hospital or clinic. It will not be stored at home. NOTE: This sheet is a summary. It may not cover all possible information. If you have questions about this medicine, talk to your doctor, pharmacist, or health care provider.  2024 Elsevier/Gold Standard (  2021-06-03 00:00:00)  Pertuzumab Injection What is this medication? PERTUZUMAB (per TOOZ ue mab) treats breast cancer. It works by blocking a protein that causes cancer cells to grow and multiply. This helps to slow or stop the spread of cancer cells. It is a monoclonal antibody. This medicine may be used for other purposes; ask your health care provider or pharmacist if you have questions. COMMON BRAND NAME(S): PERJETA What should I tell my care team  before I take this medication? They need to know if you have any of these conditions: Heart failure An unusual or allergic reaction to pertuzumab, other medications, foods, dyes, or preservatives Pregnant or trying to get pregnant Breast-feeding How should I use this medication? This medication is injected into a vein. It is given by your care team in a hospital or clinic setting. Talk to your care team about the use of this medication in children. Special care may be needed. Overdosage: If you think you have taken too much of this medicine contact a poison control center or emergency room at once. NOTE: This medicine is only for you. Do not share this medicine with others. What if I miss a dose? Keep appointments for follow-up doses. It is important not to miss your dose. Call your care team if you are unable to keep an appointment. What may interact with this medication? Interactions are not expected. This list may not describe all possible interactions. Give your health care provider a list of all the medicines, herbs, non-prescription drugs, or dietary supplements you use. Also tell them if you smoke, drink alcohol, or use illegal drugs. Some items may interact with your medicine. What should I watch for while using this medication? Your condition will be monitored carefully while you are receiving this medication. This medication may make you feel generally unwell. This is not uncommon as chemotherapy can affect healthy cells as well as cancer cells. Report any side effects. Continue your course of treatment even though you feel ill unless your care team tells you to stop. Talk to your care team if you may be pregnant. Serious birth defects can occur if you take this medication during pregnancy and for 7 months after the last dose. You will need a negative pregnancy test before starting this medication. Contraception is recommended while taking this medication and for 7 months after the last  dose. Your care team can help you find the option that works for you. Do not breastfeed while taking this medication and for 7 months after the last dose. What side effects may I notice from receiving this medication? Side effects that you should report to your care team as soon as possible: Allergic reactions or angioedema--skin rash, itching or hives, swelling of the face, eyes, lips, tongue, arms, or legs, trouble swallowing or breathing Heart failure--shortness of breath, swelling of the ankles, feet, or hands, sudden weight gain, unusual weakness or fatigue Infusion reactions--chest pain, shortness of breath or trouble breathing, feeling faint or lightheaded Side effects that usually do not require medical attention (report to your care team if they continue or are bothersome): Diarrhea Dry skin Fatigue Hair loss Nausea Vomiting This list may not describe all possible side effects. Call your doctor for medical advice about side effects. You may report side effects to FDA at 1-800-FDA-1088. Where should I keep my medication? This medication is given in a hospital or clinic. It will not be stored at home. NOTE: This sheet is a summary. It may not cover all possible  information. If you have questions about this medicine, talk to your doctor, pharmacist, or health care provider.  2024 Elsevier/Gold Standard (2021-06-03 00:00:00)

## 2022-11-04 NOTE — Assessment & Plan Note (Signed)
Hemoglobin is stable. Monitor counts.

## 2022-11-04 NOTE — Telephone Encounter (Signed)
-----   Message from [name] sent at 11/04/2022 12:46 PM EDT ----- Please let patient know that her bone scan showed no metastatic bone disease.  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 There is a focal uptake in the left breast which could be secondary to postsurgical changes.  I recommend unilateral diagnostic mammogram left breast to make sure there is no residual tumor.

## 2022-11-04 NOTE — Progress Notes (Signed)
Hematology/Oncology Progress note [phone] [phone]     CHIEF COMPLAINTS/REASON FOR VISIT:  Follow-up for Stage IIIB left breast cancer, ER-, HER2+  ASSESSMENT & PLAN:   Cancer Staging  Breast cancer [facility]) Staging form: Breast, AJCC 8th Edition - Clinical stage from 08/21/2021: Stage IIIB (cT4b, cN3c, cM0, G3, ER-, PR-, HER2+) - Signed by [name], MD on 09/12/2021  Breast cancer (HCC) Left breast cancer, cT4b N3 Mx, ER/PR-, HER2 + S/p neoadjuvant chemotherapy 6 cycles of TCHP  S/p left mastectomy + SLNB -ypT0 ypN0, complete pathological response.  Recommend adjuvant Transtuzumab and Pertuzumab to complete 1 year treatment if she tolerates.  S/p adjuvant radiation- last RT 7/1  Labs reviewed and discussed with patient.   Proceed with trastuzumab and pertuzumab today.   Sept 2024 echocardiogram- stable LVEF- repeat echo in Nov/Dec  Recommend adjuvant Zometa- she declined.  Focal uptake of the left breast on bone scan.  Possible scarring tissue.  Repeat left unilateral mammogram.  Normocytic anemia Hemoglobin is stable. Monitor counts.   Hypomagnesemia IV magnesium PRN if Mag <1.7 Increase Slow Mag 1 tab BID.   Hypokalemia continue potassium chloride twice daily.   Back pain Chronic issue for her.  Xray was not done, per patient her pain doctor has done the xray.  Result was scanned in media.  Lumbar xray showed stable spondylosis and spondylolisthesis.  She has multiple bone pain, - will get bone scan-negative for metastatic disease.  Patient has degenerative disease.     Muscle cramps Santina Evans has improved.  She continues to have symptoms. calcium and vitamin D supplementation.  Continue   No orders of the defined types were placed in this encounter.  Follow up in 3 weeks.  All questions were answered. The patient knows to call the clinic with any problems questions or concerns.  Return of visit:  3 weeks lab MD Transtuzumab  + Pertuzumab +/- IV magnesium  [name], MD, PhD [facility] Hematology Oncology 11/04/2022      HISTORY OF PRESENTING ILLNESS:  Vanessa Fisher is a  62 y.o.  female with PMH listed below who was referred to me for evaluation of  left breast cancer SUMMARY OF ONCOLOGIC HISTORY: Oncology History  Breast cancer (HCC)  07/31/2021 Imaging   Bilateral diagnostic mammogram and US showed 5 centimeter LEFT breast mass associated with pleomorphic calcifications is suspicious for invasive ductal carcinoma.At least 4 LEFT axillary lymph nodes with abnormal morphology   08/21/2021 Cancer Staging   Staging form: Breast, AJCC 8th Edition - Clinical: Stage IIIB (cT4b, cN3c, cM0, G3, ER-, PR-, HER2+) - Signed by [name], MD on 08/28/2021 Histologic grading system: 3 grade system  -08/21/21 left breast ultrasound-guided biopsy showed invasive mammary carcinoma, grade 3, ER/PR negative, HER2 positive.  Left axillary lymph node biopsy positive for macro metastatic mammary carcinoma, 8 mm in greatest extent.   08/30/2021 Imaging   MRI brain w wo contrast -No metastatic disease or acute intracranial abnormality. Essentially normal for age MRI appearance of the brain.    09/03/2021 Echocardiogram   1. Left ventricular ejection fraction, by estimation, is 55 to 60%. Left ventricular ejection fraction by 3D volume is 58 %. The left ventricle has normal function. The left ventricle has no regional wall motion abnormalities. There is mild left  ventricular hypertrophy. Left ventricular diastolic parameters were normal.  2. Right ventricular systolic function is normal. The right ventricular size is normal.  3. The mitral valve is normal in structure. No evidence  of mitral valve regurgitation.  4. The aortic valve was not well visualized. Aortic valve regurgitation is not visualized   09/10/2021 Imaging   PET scan showed Large hypermetabolic left breast mass and diffuse skin thickening,consistent with  primary breast carcinoma. Hypermetabolic lymphadenopathy in the left axilla, left subpectoral region, and left supraclavicular region, consistent with metastatic disease. No evidence of metastatic disease within the abdomen or pelvis   09/12/2021 - 02/13/2022 Chemotherapy   Patient is on Treatment Plan : BREAST  Docetaxel + Carboplatin + Trastuzumab + Pertuzumab  (TCHP) q21d       Genetic Testing   Negative genetic testing. No pathogenic variants identified on the [facility] Common Hereditary Cancers+RNA panel. The report date is 10/26/2021.  The Common Hereditary Cancers Panel + RNA offered by [facility] includes sequencing and/or deletion duplication testing of the following 47 genes: APC, ATM, AXIN2, BARD1, BMPR1A, BRCA1, BRCA2, BRIP1, CDH1, CDKN2A (p14ARF), CDKN2A (p16INK4a), CKD4, CHEK2, CTNNA1, DICER1, EPCAM (Deletion/duplication testing only), GREM1 (promoter region deletion/duplication testing only), KIT, MEN1, MLH1, MSH2, MSH3, MSH6, MUTYH, NBN, NF1, NHTL1, PALB2, PDGFRA, PMS2, POLD1, POLE, PTEN, RAD50, RAD51C, RAD51D, SDHB, SDHC, SDHD, SMAD4, SMARCA4. STK11, TP53, TSC1, TSC2, and VHL.  The following genes were evaluated for sequence changes only: SDHA and HOXB13 c.251G>A variant only.   02/09/2022 Echocardiogram   LVEF 55 to 60%    03/27/2022 Surgery   S/p left mastectomy + SLNB ypT0 ypN0,    04/24/2022 -  Chemotherapy   Patient is on Treatment Plan : BREAST Trastuzumab  + Pertuzumab q21d x 13 cycles     05/06/2022 Echocardiogram   LVEF 55 to 60%    07/02/2022 - 08/03/2022 Radiation Therapy   Adjuvant breast radiation.    10/20/2022 Imaging   Bone scan whole body showed 1. Degenerative uptake at the sternoclavicular joints, shoulders,knees and hindfoot bilaterally. 2. No evidence for metastatic disease to the. 3. Uptake in the left breast may be related to postsurgical change or residual tumor.    + chronic nasal pressure, congestion, horse voice she attributes to sinusitis.   INTERVAL  HISTORY Vanessa Fisher is a 62 y.o. female who has above history reviewed by me today presents for follow up visit for ER negative HER2 positive breast cancer treatment.  She has no showed to her July chemotherapy appointment x 2 times. She denies any diarrhea episodes since last visit.Marland Kitchen No fever or chills.  + lower extremity cramps.  + Reports that she has been having severe pain to shoulders ribs and arms.  Patient follows up with pain clinic.   MEDICAL HISTORY:  Past Medical History:  Diagnosis Date   Anemia    Anxiety    Arthritis    Asthma    WELL CONTROLLED   Bile acid malabsorption syndrome    Bradycardia    HAD AN ISSUE WITH THIS IN 2016-NO PROBLEMS SINCE   Breast cancer, left breast (HCC) 08/2021   Carpal tunnel syndrome on right    Depression    Diabetes mellitus without complication (HCC)    Diverticulitis    Gastric reflux    GERD (gastroesophageal reflux disease)    Hand pain 05/12/2016   Headache    H/O MIGRAINES   History of kidney stones    H/O   Lumbar radiculitis    Neck pain, chronic    Osteoarthritis of both knees    Panic attacks     SURGICAL HISTORY: Past Surgical History:  Procedure Laterality Date   BREAST BIOPSY Left 08/21/2021  Axilla Bx, Hydromarker, neg   BREAST BIOPSY Left 08/21/2021   [place] Bx, Ribbon Clip, invasive mammary carcinoma   BREAST BIOPSY Left 03/12/2022   [place] LT RADIO FREQUENCY TAG LOC US GUIDE 03/12/2022 [facility]   BREAST BIOPSY Left 03/12/2022   [place] LT RADIO FREQUENCY TAG EA ADD LESION LOC US GUIDE 03/12/2022 [facility]   CARPAL TUNNEL RELEASE Right 12/06/2017   Procedure: CARPAL TUNNEL RELEASE;  Surgeon: Deeann Saint, MD;  Location: [facility];  Service: Orthopedics;  Laterality: Right;   COLONOSCOPY WITH PROPOFOL N/A 06/11/2021   Procedure: COLONOSCOPY WITH PROPOFOL;  Surgeon: Toney Reil, MD;  Location: [facility] ENDOSCOPY;  Service: Gastroenterology;  Laterality: N/A;   DORSAL COMPARTMENT RELEASE Right  12/06/2017   Procedure: RELEASE DORSAL COMPARTMENT (DEQUERVAIN);  Surgeon: Deeann Saint, MD;  Location: [facility] ORS;  Service: Orthopedics;  Laterality: Right;   ESOPHAGOGASTRODUODENOSCOPY (EGD) WITH PROPOFOL N/A 06/11/2021   Procedure: ESOPHAGOGASTRODUODENOSCOPY (EGD) WITH PROPOFOL;  Surgeon: Toney Reil, MD;  Location: [facility] ENDOSCOPY;  Service: Gastroenterology;  Laterality: N/A;   PART MASTECTOMY,RADIO FREQUENCY LOCALIZER,AXILLARY SENTINEL NODE BIOPSY Left 03/27/2022   Procedure: PART MASTECTOMY,RADIO FREQUENCY LOCALIZER,AXILLARY SENTINEL NODE BIOPSY;  Surgeon: Carolan Shiver, MD;  Location: [facility] ORS;  Service: General;  Laterality: Left;   PARTIAL HYSTERECTOMY     PORTACATH PLACEMENT N/A 09/24/2021   Procedure: INSERTION PORT-A-CATH;  Surgeon: Carolan Shiver, MD;  Location: [facility] ORS;  Service: General;  Laterality: N/A;   TUBAL LIGATION      SOCIAL HISTORY: Social History   Socioeconomic History   Marital status: Single    Spouse name: Not on file   Number of children: Not on file   Years of education: Not on file   Highest education level: Not on file  Occupational History   Not on file  Tobacco Use   Smoking status: Former    Types: Cigarettes   Smokeless tobacco: Never  Vaping Use   Vaping status: Never Used  Substance and Sexual Activity   Alcohol use: No   Drug use: No   Sexual activity: Not on file  Other Topics Concern   Not on file  Social History Narrative   Lives alone   Social Determinants of Health   Financial Resource Strain: High Risk (08/15/2021)   Overall Financial Resource Strain (CARDIA)    Difficulty of Paying Living Expenses: Very hard  Food Insecurity: Food Insecurity Present (09/09/2021)   Hunger Vital Sign    Worried About Running Out of Food in the Last Year: Often true    Ran Out of Food in the Last Year: Often true  Transportation Needs: No Transportation Needs (08/15/2021)   PRAPARE - Scientist, research (physical sciences) (Medical): No    Lack of Transportation (Non-Medical): No  Physical Activity: Inactive (08/15/2021)   Exercise Vital Sign    Days of Exercise per Week: 0 days    Minutes of Exercise per Session: 0 min  Stress: Stress Concern Present (08/15/2021)   [employer] of Occupational Health - Occupational Stress Questionnaire    Feeling of Stress : Rather much  Social Connections: Socially Isolated (08/15/2021)   Social Connection and Isolation Panel [NHANES]    Frequency of Communication with Friends and Family: Once a week    Frequency of Social Gatherings with Friends and Family: Once a week    Attends Religious Services: Never    Database administrator or Organizations: No    Attends Banker Meetings: Not on file  Marital Status: Never married  Intimate Partner Violence: Not At Risk (08/15/2021)   Humiliation, Afraid, Rape, and Kick questionnaire    Fear of Current or Ex-Partner: No    Emotionally Abused: No    Physically Abused: No    Sexually Abused: No    FAMILY HISTORY: Family History  Problem Relation Age of Onset   Diabetes Mother    Hypertension Mother    Heart failure Mother    Pancreatic cancer Mother 3   Diabetes Father    Hypertension Father    Breast cancer Maternal Aunt    Cervical cancer Maternal Aunt    Lung cancer Son 68    ALLERGIES:  is allergic to bc powder [aspirin-salicylamide-caffeine], doxycycline, gabapentin, hydrocodone-acetaminophen, latex, penicillin g, penicillins, and shellfish allergy.  MEDICATIONS:  Current Outpatient Medications  Medication Sig Dispense Refill   albuterol (PROVENTIL) (2.5 MG/3ML) 0.083% nebulizer solution Take 3 mLs (2.5 mg total) by nebulization every 4 (four) hours as needed for wheezing or shortness of breath. 75 mL 2   albuterol (VENTOLIN HFA) 108 (90 Base) MCG/ACT inhaler Inhale 2 puffs into the lungs every 6 (six) hours as needed for wheezing or shortness of breath. 18 g 0   atorvastatin  (LIPITOR) 10 MG tablet Take 10 mg by mouth daily.     Azelastine HCl 137 MCG/SPRAY SOLN Place 1 spray into the nose daily. 30 mL 0   calcium carbonate (OS-CAL - DOSED IN MG OF ELEMENTAL CALCIUM) 1250 (500 Ca) MG tablet Take 1 tablet (1,250 mg total) by mouth daily. 90 tablet 1   cetirizine (ZYRTEC ALLERGY) 10 MG tablet Take 1 tablet (10 mg total) by mouth daily. 90 tablet 0   cyclobenzaprine (FLEXERIL) 10 MG tablet Take 10 mg by mouth 3 (three) times daily.     esomeprazole (NEXIUM) 40 MG capsule Take 1 capsule (40 mg total) by mouth daily at 12 noon. 30 capsule 3   FLOVENT DISKUS 250 MCG/ACT AEPB Inhale 1 puff into the lungs daily.     fluticasone (FLONASE) 50 MCG/ACT nasal spray Place 2 sprays into both nostrils daily. 15.8 mL 0   Iron-Vitamin C 65-125 MG TABS Take 1 tablet by mouth daily at 2 PM. 30 tablet 2   KLOR-CON M20 20 MEQ tablet TAKE 1 TABLET BY MOUTH TWICE A DAY 60 tablet 1   lidocaine (LIDODERM) 5 % 2 patches daily.     lidocaine-prilocaine (EMLA) cream Apply 1 Application topically as needed. 30 g 12   magic mouthwash (multi-ingredient) oral suspension Swish and spit 5-10 ml by mouth 4 times a day as needed 400 mL 1   magnesium chloride (SLOW-MAG) 64 MG TBEC SR tablet Take 1 tablet (64 mg total) by mouth 2 (two) times daily. 60 tablet 2   montelukast (SINGULAIR) 10 MG tablet Take by mouth.     ondansetron (ZOFRAN) 8 MG tablet Take 1 tablet (8 mg total) by mouth 2 (two) times daily as needed (Nausea or vomiting). Start on the third day after chemotherapy. 30 tablet 1   oxyCODONE-acetaminophen (PERCOCET/ROXICET) 5-325 MG tablet Take 1 tablet by mouth every 6 (six) hours as needed for pain.     prochlorperazine (COMPAZINE) 10 MG tablet Take 1 tablet (10 mg total) by mouth every 6 (six) hours as needed (Nausea or vomiting). 30 tablet 1   tamsulosin (FLOMAX) 0.4 MG CAPS capsule Take 0.4 mg by mouth daily.     zinc gluconate 50 MG tablet Take 50 mg by mouth as needed.  diphenoxylate-atropine (LOMOTIL) 2.5-0.025 MG tablet Take 1 tablet by mouth 4 (four) times daily as needed for diarrhea or loose stools. (Patient not taking: Reported on 10/14/2022) 120 tablet 0   escitalopram (LEXAPRO) 10 MG tablet Take 1 tablet (10 mg total) by mouth daily. (Patient not taking: Reported on 11/04/2022) 30 tablet 1   naloxone (NARCAN) nasal spray 4 mg/0.1 mL  (Patient not taking: Reported on 10/14/2022)     silver sulfADIAZINE (SILVADENE) 1 % cream Apply 1 Application topically daily. (Patient not taking: Reported on 11/04/2022) 50 g 1   No current facility-administered medications for this visit.   Facility-Administered Medications Ordered in Other Visits  Medication Dose Route Frequency Provider Last Rate Last Admin   magnesium sulfate IVPB 2 g 50 mL  2 g Intravenous Once Covington, Sarah M, PA-C        Review of Systems  Constitutional:  Negative for appetite change, chills, fatigue and fever.  HENT:   Negative for hearing loss and voice change.   Eyes:  Negative for eye problems.  Respiratory:  Negative for chest tightness and cough.   Cardiovascular:  Negative for chest pain.  Gastrointestinal:  Negative for abdominal distention, abdominal pain, blood in stool and diarrhea.  Endocrine: Negative for hot flashes.  Genitourinary:  Negative for difficulty urinating and frequency.   Musculoskeletal:  Positive for arthralgias and back pain.       Leg cramps  Skin:  Negative for itching and rash.  Neurological:  Negative for extremity weakness and headaches.  Hematological:  Negative for adenopathy.  Psychiatric/Behavioral:  Negative for confusion.      PHYSICAL EXAMINATION: ECOG PERFORMANCE STATUS: 1 - Symptomatic but completely ambulatory Vitals:   11/04/22 0926  BP: (!) 116/46  Pulse: 72  Resp: 18  Temp: (!) 97.1 F (36.2 C)   Filed Weights   11/04/22 0926  Weight: 225 lb 4.8 oz (102.2 kg)    Physical Exam Constitutional:      General: She is not in acute  distress.    Appearance: She is not diaphoretic.  HENT:     Head: Normocephalic and atraumatic.  Eyes:     General: No scleral icterus. Cardiovascular:     Rate and Rhythm: Normal rate and regular rhythm.     Heart sounds: No murmur heard. Pulmonary:     Effort: Pulmonary effort is normal. No respiratory distress.     Breath sounds: No wheezing.  Abdominal:     General: There is no distension.     Palpations: Abdomen is soft.     Tenderness: There is no abdominal tenderness.  Musculoskeletal:        General: Normal range of motion.     Cervical back: Normal range of motion and neck supple.  Skin:    General: Skin is warm and dry.     Findings: No erythema.  Neurological:     Mental Status: She is alert and oriented to person, place, and time.     Cranial Nerves: No cranial nerve deficit.     Motor: No abnormal muscle tone.     Coordination: Coordination normal.  Psychiatric:        Mood and Affect: Affect normal.        LABORATORY DATA:  I have reviewed the data as listed    Latest Ref Rng & Units 11/04/2022    9:10 AM 10/14/2022    8:18 AM 09/23/2022    8:44 AM  CBC  WBC 4.0 - 10.5 K/uL  4.7  4.6  3.8   Hemoglobin 12.0 - 15.0 g/dL 16.1  9.7  9.4   Hematocrit 36.0 - 46.0 % 31.7  32.1  30.3   Platelets 150 - 400 K/uL 227  266  231       Latest Ref Rng & Units 11/04/2022    9:10 AM 10/14/2022    8:18 AM 09/23/2022    8:44 AM  CMP  Glucose 70 - 99 mg/dL 096  045  409   BUN 6 - 20 mg/dL 18  13  10    Creatinine 0.44 - 1.00 mg/dL 8.11  9.14  7.82   Sodium 135 - 145 mmol/L 137  134  138   Potassium 3.5 - 5.1 mmol/L 3.8  4.1  3.6   Chloride 98 - 111 mmol/L 104  103  107   CO2 22 - 32 mmol/L 24  25  24    Calcium 8.9 - 10.3 mg/dL 9.0  8.6  8.4   Total Protein 6.5 - 8.1 g/dL 7.3  6.8  6.7   Total Bilirubin 0.3 - 1.2 mg/dL 0.5  0.3  0.4   Alkaline Phos 38 - 126 U/L 96  97  97   AST 15 - 41 U/L 21  20  18    ALT 0 - 44 U/L 16  15  13        RADIOGRAPHIC STUDIES: I  have personally reviewed the radiological images as listed and agreed with the findings in the report. NM Bone Scan Whole Body  Result Date: 11/04/2022 CLINICAL DATA:  Breast cancer. Bone pain. Pain in neck, bilateral ribs and lower extremities for 3 months. EXAM: NUCLEAR MEDICINE WHOLE BODY BONE SCAN TECHNIQUE: Whole body anterior and posterior images were obtained approximately 3 hours after intravenous injection of radiopharmaceutical. RADIOPHARMACEUTICALS:  20.43 mCi Technetium-23m MDP IV COMPARISON:  CT of the abdomen and pelvis with contrast 04/17/2021 whole-body PET 09/10/2021. FINDINGS: Degenerative uptake is present at the sternoclavicular joints bilaterally. Degenerative uptake is also present the shoulders, knees and hindfoot bilaterally. No other focal uptake is present to suggest metastatic disease to the axial skeleton. Focal uptake is present in the left breast corresponding to the surgical site. IMPRESSION: 1. Degenerative uptake at the sternoclavicular joints, shoulders, knees and hindfoot bilaterally. 2. No evidence for metastatic disease to the. 3. Uptake in the left breast may be related to postsurgical change or residual tumor. Electronically Signed   By: Marin Roberts M.D.   On: 11/04/2022 09:52   ECHOCARDIOGRAM COMPLETE  Result Date: 10/15/2022    ECHOCARDIOGRAM REPORT   Patient Name:   BRAEDEN KOEP Date of Exam: 10/15/2022 Medical Rec #:  [id number]            Height:       66.0 in Accession #:    [id number]           Weight:       223.8 lb Date of Birth:  August 24, 1960            BSA:          2.098 m Patient Age:    62 years             BP:           122/72 mmHg Patient Gender: F                    HR:           70 bpm. Exam Location:  [facility] Procedure: 2D Echo, Cardiac Doppler, Color Doppler and Strain Analysis Indications:     Chemo Z09  History:         Patient has prior history of Echocardiogram examinations, most                  recent 05/06/2022.  Sonographer:     Cristela Blue Referring Phys:  [id number] Graceanne Guin Diagnosing Phys: Julien Nordmann MD  Sonographer Comments: Global longitudinal strain was attempted. IMPRESSIONS  1. Left ventricular ejection fraction, by estimation, is 60 to 65%. Left ventricular ejection fraction by PLAX is 72 %. The left ventricle has normal function. The left ventricle has no regional wall motion abnormalities. Left ventricular diastolic parameters were normal. The average left ventricular global longitudinal strain is -18.2 %.  2. Right ventricular systolic function is normal. The right ventricular size is normal.  3. The mitral valve is normal in structure. Mild mitral valve regurgitation. No evidence of mitral stenosis.  4. The aortic valve has an indeterminant number of cusps. Aortic valve regurgitation is not visualized. No aortic stenosis is present.  5. The inferior vena cava is normal in size with greater than 50% respiratory variability, suggesting right atrial pressure of 3 mmHg. FINDINGS  Left Ventricle: Left ventricular ejection fraction, by estimation, is 60 to 65%. Left ventricular ejection fraction by PLAX is 72 %. The left ventricle has normal function. The left ventricle has no regional wall motion abnormalities. The average left ventricular global longitudinal strain is -18.2 %. The left ventricular internal cavity size was normal in size. There is no left ventricular hypertrophy. Left ventricular diastolic parameters were normal. Right Ventricle: The right ventricular size is normal. No increase in right ventricular wall thickness. Right ventricular systolic function is normal. Left Atrium: Left atrial size was normal in size. Right Atrium: Right atrial size was normal in size. Pericardium: There is no evidence of pericardial effusion. Mitral Valve: The mitral valve is normal in structure. Mild mitral valve regurgitation. No evidence of mitral valve stenosis. MV peak gradient, 4.6 mmHg. The mean mitral valve gradient is 2.0 mmHg.  Tricuspid Valve: The tricuspid valve is normal in structure. Tricuspid valve regurgitation is not demonstrated. No evidence of tricuspid stenosis. Aortic Valve: The aortic valve has an indeterminant number of cusps. Aortic valve regurgitation is not visualized. No aortic stenosis is present. Aortic valve mean gradient measures 6.0 mmHg. Aortic valve peak gradient measures 9.4 mmHg. Aortic valve area, by VTI measures 2.44 cm. Pulmonic Valve: The pulmonic valve was normal in structure. Pulmonic valve regurgitation is not visualized. No evidence of pulmonic stenosis. Aorta: The aortic root is normal in size and structure. Venous: The inferior vena cava is normal in size with greater than 50% respiratory variability, suggesting right atrial pressure of 3 mmHg. IAS/Shunts: No atrial level shunt detected by color flow Doppler.  LEFT VENTRICLE PLAX 2D LV EF:         Left            Diastology                ventricular     LV e' medial:    11.10 cm/s                ejection        LV E/e' medial:  9.3                fraction by     LV e' lateral:   16.50  cm/s                PLAX is 72      LV E/e' lateral: 6.2                %. LVIDd:         4.40 cm         2D LVIDs:         2.60 cm         Longitudinal LV PW:         1.00 cm         Strain LV IVS:        1.00 cm         2D Strain GLS  -18.2 % LVOT diam:     2.00 cm         Avg: LV SV:         80 LV SV Index:   38 LVOT Area:     3.14 cm  RIGHT VENTRICLE RV Basal diam:  3.70 cm RV Mid diam:    3.20 cm RV S prime:     13.20 cm/s TAPSE (M-mode): 3.1 cm LEFT ATRIUM             Index        RIGHT ATRIUM           Index LA diam:        2.70 cm 1.29 cm/m   RA Area:     15.20 cm LA Vol (A2C):   63.7 ml 30.37 ml/m  RA Volume:   41.80 ml  19.93 ml/m LA Vol (A4C):   31.8 ml 15.16 ml/m LA Biplane Vol: 47.3 ml 22.55 ml/m  AORTIC VALVE AV Area (Vmax):    2.30 cm AV Area (Vmean):   2.06 cm AV Area (VTI):     2.44 cm AV Vmax:           153.00 cm/s AV Vmean:          113.000  cm/s AV VTI:            0.329 m AV Peak Grad:      9.4 mmHg AV Mean Grad:      6.0 mmHg LVOT Vmax:         112.00 cm/s LVOT Vmean:        74.100 cm/s LVOT VTI:          0.256 m LVOT/AV VTI ratio: 0.78  AORTA Ao Root diam: 2.37 cm MITRAL VALVE                TRICUSPID VALVE MV Area (PHT): 3.50 cm     TR Peak grad:   25.4 mmHg MV Area VTI:   2.72 cm     TR Vmax:        252.00 cm/s MV Peak grad:  4.6 mmHg MV Mean grad:  2.0 mmHg     SHUNTS MV Vmax:       1.07 m/s     Systemic VTI:  0.26 m MV Vmean:      62.4 cm/s    Systemic Diam: 2.00 cm MV Decel Time: 217 msec MV E velocity: 103.00 cm/s MV A velocity: 68.70 cm/s MV E/A ratio:  1.50 Julien Nordmann MD Electronically signed by Julien Nordmann MD Signature Date/Time: 10/15/2022/2:45:16 PM    Final

## 2022-11-11 ENCOUNTER — Other Ambulatory Visit: Payer: Self-pay | Admitting: Oncology

## 2022-11-11 ENCOUNTER — Ambulatory Visit
Admission: RE | Admit: 2022-11-11 | Discharge: 2022-11-11 | Disposition: A | Discharge status: Home / Self Care | Payer: Medicare HMO | Source: Ambulatory Visit | Attending: Oncology | Admitting: Oncology

## 2022-11-11 ENCOUNTER — Inpatient Hospital Stay
Admission: RE | Admit: 2022-11-11 | Discharge: 2022-11-11 | Disposition: A | Discharge status: Home / Self Care | Payer: Medicare HMO | Source: Ambulatory Visit | Attending: Oncology

## 2022-11-11 DIAGNOSIS — C50911 Malignant neoplasm of unspecified site of right female breast: Secondary | ICD-10-CM

## 2022-11-11 DIAGNOSIS — Z171 Estrogen receptor negative status [ER-]: Secondary | ICD-10-CM | POA: Insufficient documentation

## 2022-11-11 DIAGNOSIS — R9230 Dense breasts, unspecified: Secondary | ICD-10-CM | POA: Insufficient documentation

## 2022-11-11 HISTORY — DX: Personal history of antineoplastic chemotherapy: Z92.21

## 2022-11-11 HISTORY — DX: Personal history of irradiation: Z92.3

## 2022-11-12 ENCOUNTER — Other Ambulatory Visit: Payer: Self-pay | Admitting: Oncology

## 2022-11-12 DIAGNOSIS — R928 Other abnormal and inconclusive findings on diagnostic imaging of breast: Secondary | ICD-10-CM

## 2022-11-18 ENCOUNTER — Other Ambulatory Visit: Payer: Medicare HMO

## 2022-11-18 ENCOUNTER — Inpatient Hospital Stay: Payer: Medicare HMO | Admitting: Licensed Clinical Social Worker

## 2022-11-18 NOTE — Progress Notes (Signed)
[facility] CSW Progress Note  Clinical Child psychotherapist contacted patient by phone per the request of [name].  Vanessa Fisher stated she is experiencing financial strain.  She has contacted several resources and is not receiving assistance.  [facility] and [facility].  Patient expressed appreciation for the call.    Dorothey Baseman, LCSW Clinical Social Worker [facility]

## 2022-11-19 ENCOUNTER — Ambulatory Visit
Admission: RE | Admit: 2022-11-19 | Discharge: 2022-11-19 | Disposition: A | Discharge status: Home / Self Care | Payer: Medicare HMO | Source: Ambulatory Visit | Attending: Oncology | Admitting: Oncology

## 2022-11-19 DIAGNOSIS — R928 Other abnormal and inconclusive findings on diagnostic imaging of breast: Secondary | ICD-10-CM

## 2022-11-19 DIAGNOSIS — N6489 Other specified disorders of breast: Secondary | ICD-10-CM | POA: Diagnosis not present

## 2022-11-19 DIAGNOSIS — M96843 Postprocedural seroma of a musculoskeletal structure following other procedure: Secondary | ICD-10-CM | POA: Diagnosis not present

## 2022-11-19 DIAGNOSIS — Z853 Personal history of malignant neoplasm of breast: Secondary | ICD-10-CM | POA: Insufficient documentation

## 2022-11-19 MED ORDER — LIDOCAINE HCL 1 % IJ SOLN
5.0000 mL | Freq: Once | INTRAMUSCULAR | Status: AC
  Administered 2022-11-19: 5 mL
  Filled 2022-11-19: qty 5

## 2022-11-23 ENCOUNTER — Other Ambulatory Visit: Payer: Self-pay | Admitting: Oncology

## 2022-11-23 ENCOUNTER — Telehealth: Payer: Self-pay | Admitting: Oncology

## 2022-11-23 NOTE — Telephone Encounter (Signed)
Pt has the flu and wants to r/s her appts on 10/23. She stated she is good with any day for it to be r/s and I will call her with the updated dates/times. Her appts are from [facility], I will need Dr.Yu to change the dates before I can r/s   Thank you

## 2022-11-25 ENCOUNTER — Inpatient Hospital Stay: Payer: Medicare HMO

## 2022-11-25 ENCOUNTER — Inpatient Hospital Stay: Payer: Medicare HMO | Admitting: Oncology

## 2022-12-02 ENCOUNTER — Telehealth: Payer: Self-pay

## 2022-12-02 ENCOUNTER — Inpatient Hospital Stay (HOSPITAL_BASED_OUTPATIENT_CLINIC_OR_DEPARTMENT_OTHER): Payer: Medicare HMO | Admitting: Oncology

## 2022-12-02 ENCOUNTER — Encounter: Payer: Self-pay | Admitting: Oncology

## 2022-12-02 ENCOUNTER — Inpatient Hospital Stay: Payer: Medicare HMO

## 2022-12-02 VITALS — BP 120/61 | HR 66 | Temp 97.5°F | Resp 18 | Ht 66.0 in | Wt 231.7 lb

## 2022-12-02 DIAGNOSIS — C50911 Malignant neoplasm of unspecified site of right female breast: Secondary | ICD-10-CM | POA: Diagnosis not present

## 2022-12-02 DIAGNOSIS — C50812 Malignant neoplasm of overlapping sites of left female breast: Secondary | ICD-10-CM

## 2022-12-02 DIAGNOSIS — M25519 Pain in unspecified shoulder: Secondary | ICD-10-CM

## 2022-12-02 DIAGNOSIS — M549 Dorsalgia, unspecified: Secondary | ICD-10-CM | POA: Diagnosis not present

## 2022-12-02 DIAGNOSIS — F411 Generalized anxiety disorder: Secondary | ICD-10-CM

## 2022-12-02 DIAGNOSIS — Z5112 Encounter for antineoplastic immunotherapy: Secondary | ICD-10-CM | POA: Diagnosis not present

## 2022-12-02 DIAGNOSIS — F419 Anxiety disorder, unspecified: Secondary | ICD-10-CM | POA: Insufficient documentation

## 2022-12-02 DIAGNOSIS — R1011 Right upper quadrant pain: Secondary | ICD-10-CM | POA: Insufficient documentation

## 2022-12-02 DIAGNOSIS — Z171 Estrogen receptor negative status [ER-]: Secondary | ICD-10-CM | POA: Diagnosis not present

## 2022-12-02 LAB — CMP (CANCER CENTER ONLY)
ALT: 16 U/L (ref 0–44)
AST: 20 U/L (ref 15–41)
Albumin: 3.5 g/dL (ref 3.5–5.0)
Alkaline Phosphatase: 98 U/L (ref 38–126)
Anion gap: 5 (ref 5–15)
BUN: 16 mg/dL (ref 6–20)
CO2: 27 mmol/L (ref 22–32)
Calcium: 9 mg/dL (ref 8.9–10.3)
Chloride: 105 mmol/L (ref 98–111)
Creatinine: 0.81 mg/dL (ref 0.44–1.00)
GFR, Estimated: >60 mL/min (ref >60)
Glucose, Bld: 129 mg/dL — ABNORMAL HIGH (ref 70–99)
Potassium: 4.2 mmol/L (ref 3.5–5.1)
Sodium: 137 mmol/L (ref 135–145)
Total Bilirubin: 0.2 mg/dL — ABNORMAL LOW (ref 0.3–1.2)
Total Protein: 7 g/dL (ref 6.5–8.1)

## 2022-12-02 LAB — CBC (CANCER CENTER ONLY)
HCT: 31.5 % — ABNORMAL LOW (ref 36.0–46.0)
Hemoglobin: 9.9 g/dL — ABNORMAL LOW (ref 12.0–15.0)
MCH: 26.2 pg (ref 26.0–34.0)
MCHC: 31.4 g/dL (ref 30.0–36.0)
MCV: 83.3 fL (ref 80.0–100.0)
Platelet Count: 244 10*3/uL (ref 150–400)
RBC: 3.78 MIL/uL — ABNORMAL LOW (ref 3.87–5.11)
RDW: 14.8 % (ref 11.5–15.5)
WBC Count: 5 10*3/uL (ref 4.0–10.5)
nRBC: 0 % (ref 0.0–0.2)

## 2022-12-02 LAB — MAGNESIUM: Magnesium: 1.7 mg/dL (ref 1.7–2.4)

## 2022-12-02 MED ORDER — HEPARIN SOD (PORK) LOCK FLUSH 100 UNIT/ML IV SOLN
500.0000 [IU] | Freq: Once | INTRAVENOUS | Status: AC | PRN
  Administered 2022-12-02: 500 [IU]
  Filled 2022-12-02: qty 5

## 2022-12-02 MED ORDER — SODIUM CHLORIDE 0.9 % IV SOLN
Freq: Once | INTRAVENOUS | Status: AC
  Filled 2022-12-02: qty 250

## 2022-12-02 MED ORDER — DIPHENHYDRAMINE HCL 25 MG PO CAPS
50.0000 mg | ORAL_CAPSULE | Freq: Once | ORAL | Status: AC
  Administered 2022-12-02: 50 mg via ORAL

## 2022-12-02 MED ORDER — SODIUM CHLORIDE 0.9 % IV SOLN
420.0000 mg | Freq: Once | INTRAVENOUS | Status: DC
  Filled 2022-12-02: qty 14

## 2022-12-02 MED ORDER — TRASTUZUMAB-ANNS CHEMO 150 MG IV SOLR
6.0000 mg/kg | Freq: Once | INTRAVENOUS | Status: DC
  Filled 2022-12-02: qty 28.6

## 2022-12-02 MED ORDER — DIPHENHYDRAMINE HCL 25 MG PO CAPS: 50.0000 mg | ORAL_CAPSULE | Freq: Once | ORAL | Status: DC

## 2022-12-02 MED ORDER — SODIUM CHLORIDE 0.9 % IV SOLN
Freq: Once | INTRAVENOUS | Status: DC
  Filled 2022-12-02: qty 250

## 2022-12-02 MED ORDER — ACETAMINOPHEN 325 MG PO TABS
650.0000 mg | ORAL_TABLET | Freq: Once | ORAL | Status: AC
  Administered 2022-12-02: 650 mg via ORAL

## 2022-12-02 MED ORDER — SODIUM CHLORIDE 0.9 % IV SOLN
420.0000 mg | Freq: Once | INTRAVENOUS | Status: AC
  Administered 2022-12-02: 420 mg via INTRAVENOUS
  Filled 2022-12-02: qty 14

## 2022-12-02 MED ORDER — ACETAMINOPHEN 325 MG PO TABS: 650.0000 mg | ORAL_TABLET | Freq: Once | ORAL | Status: DC

## 2022-12-02 MED ORDER — TRASTUZUMAB-ANNS CHEMO 150 MG IV SOLR
6.0000 mg/kg | Freq: Once | INTRAVENOUS | Status: AC
  Administered 2022-12-02: 600 mg via INTRAVENOUS
  Filled 2022-12-02: qty 28.57

## 2022-12-02 NOTE — Patient Instructions (Signed)

## 2022-12-02 NOTE — Progress Notes (Signed)
Hematology/Oncology Progress note [phone] [phone]     CHIEF COMPLAINTS/REASON FOR VISIT:  Follow-up for Stage IIIB left breast cancer, ER-, HER2+  ASSESSMENT & PLAN:   Cancer Staging  Breast cancer [facility]) Staging form: Breast, AJCC 8th Edition - Clinical stage from 08/21/2021: Stage IIIB (cT4b, cN3c, cM0, G3, ER-, PR-, HER2+) - Signed by [name], MD on 09/12/2021  Breast cancer (HCC) Left breast cancer, cT4b N3 Mx, ER/PR-, HER2 + S/p neoadjuvant chemotherapy 6 cycles of TCHP  S/p left mastectomy + SLNB -ypT0 ypN0, complete pathological response.  Recommend adjuvant Transtuzumab and Pertuzumab to complete 1 year treatment if she tolerates.  S/p adjuvant radiation- last RT 7/1  Labs reviewed and discussed with patient.   Proceed with trastuzumab and pertuzumab today.   Sept 2024 echocardiogram- stable LVEF- repeat echo in Nov/Dec  Recommend adjuvant Zometa- she declined.  Focal uptake of the left breast on bone scan.  Possible scarring tissue.  Repeat left unilateral mammogram.  Right upper quadrant pain CT abdomen pelvis w contrast for further evaluation,.   Anxiety Refer to [facility] for behavior health    Orders Placed This Encounter  Procedures   CT ABDOMEN W CONTRAST    Standing Status:   Future    Standing Expiration Date:   12/02/2023    Order Specific Question:   If indicated for the ordered procedure, I authorize the administration of contrast media per Radiology protocol    Answer:   Yes    Order Specific Question:   Does the patient have a contrast media/X-ray dye allergy?    Answer:   No    Order Specific Question:   Is patient pregnant?    Answer:   No    Order Specific Question:   Preferred imaging location?    Answer:   [facility]    Order Specific Question:   If indicated for the ordered procedure, I authorize the administration of oral contrast media per Radiology protocol    Answer:   Yes   Ambulatory referral to  Orthopedic Surgery    Referral Priority:   Routine    Referral Type:   Surgical    Referral Reason:   Specialty Services Required    Requested Specialty:   Orthopedic Surgery    Number of Visits Requested:   1   Ambulatory Referral to [facility]    Referral Priority:   Routine    Referral Type:   Consultation    Referral Reason:   Specialty Services Required    Requested Specialty:   Oncology    Number of Visits Requested:   1   Follow up in 3 weeks.  All questions were answered. The patient knows to call the clinic with any problems questions or concerns.  Return of visit:  3 weeks lab MD Transtuzumab + Pertuzumab +/- IV magnesium  [name], MD, PhD [facility] Hematology Oncology 12/02/2022      HISTORY OF PRESENTING ILLNESS:  Vanessa Fisher is a  62 y.o.  female with PMH listed below who was referred to me for evaluation of  left breast cancer SUMMARY OF ONCOLOGIC HISTORY: Oncology History  Breast cancer (HCC)  07/31/2021 Imaging   Bilateral diagnostic mammogram and US showed 5 centimeter LEFT breast mass associated with pleomorphic calcifications is suspicious for invasive ductal carcinoma.At least 4 LEFT axillary lymph nodes with abnormal morphology   08/21/2021 Cancer Staging   Staging form: Breast, AJCC 8th Edition - Clinical: Stage IIIB (cT4b,  cN3c, cM0, G3, ER-, PR-, HER2+) - Signed by [name], MD on 08/28/2021 Histologic grading system: 3 grade system  -08/21/21 left breast ultrasound-guided biopsy showed invasive mammary carcinoma, grade 3, ER/PR negative, HER2 positive.  Left axillary lymph node biopsy positive for macro metastatic mammary carcinoma, 8 mm in greatest extent.   08/30/2021 Imaging   MRI brain w wo contrast -No metastatic disease or acute intracranial abnormality. Essentially normal for age MRI appearance of the brain.    09/03/2021 Echocardiogram   1. Left ventricular ejection fraction, by estimation, is 55 to 60%. Left ventricular ejection  fraction by 3D volume is 58 %. The left ventricle has normal function. The left ventricle has no regional wall motion abnormalities. There is mild left  ventricular hypertrophy. Left ventricular diastolic parameters were normal.  2. Right ventricular systolic function is normal. The right ventricular size is normal.  3. The mitral valve is normal in structure. No evidence of mitral valve regurgitation.  4. The aortic valve was not well visualized. Aortic valve regurgitation is not visualized   09/10/2021 Imaging   PET scan showed Large hypermetabolic left breast mass and diffuse skin thickening,consistent with primary breast carcinoma. Hypermetabolic lymphadenopathy in the left axilla, left subpectoral region, and left supraclavicular region, consistent with metastatic disease. No evidence of metastatic disease within the abdomen or pelvis   09/12/2021 - 02/13/2022 Chemotherapy   Patient is on Treatment Plan : BREAST  Docetaxel + Carboplatin + Trastuzumab + Pertuzumab  (TCHP) q21d       Genetic Testing   Negative genetic testing. No pathogenic variants identified on the [facility] Common Hereditary Cancers+RNA panel. The report date is 10/26/2021.  The Common Hereditary Cancers Panel + RNA offered by [facility] includes sequencing and/or deletion duplication testing of the following 47 genes: APC, ATM, AXIN2, BARD1, BMPR1A, BRCA1, BRCA2, BRIP1, CDH1, CDKN2A (p14ARF), CDKN2A (p16INK4a), CKD4, CHEK2, CTNNA1, DICER1, EPCAM (Deletion/duplication testing only), GREM1 (promoter region deletion/duplication testing only), KIT, MEN1, MLH1, MSH2, MSH3, MSH6, MUTYH, NBN, NF1, NHTL1, PALB2, PDGFRA, PMS2, POLD1, POLE, PTEN, RAD50, RAD51C, RAD51D, SDHB, SDHC, SDHD, SMAD4, SMARCA4. STK11, TP53, TSC1, TSC2, and VHL.  The following genes were evaluated for sequence changes only: SDHA and HOXB13 c.251G>A variant only.   02/09/2022 Echocardiogram   LVEF 55 to 60%    03/27/2022 Surgery   S/p left mastectomy + SLNB ypT0 ypN0,     04/24/2022 -  Chemotherapy   Patient is on Treatment Plan : BREAST Trastuzumab  + Pertuzumab q21d x 13 cycles     05/06/2022 Echocardiogram   LVEF 55 to 60%    07/02/2022 - 08/03/2022 Radiation Therapy   Adjuvant breast radiation.    10/20/2022 Imaging   Bone scan whole body showed 1. Degenerative uptake at the sternoclavicular joints, shoulders,knees and hindfoot bilaterally. 2. No evidence for metastatic disease to the. 3. Uptake in the left breast may be related to postsurgical change or residual tumor.    + chronic nasal pressure, congestion, horse voice she attributes to sinusitis.   INTERVAL HISTORY Vanessa Fisher is a 62 y.o. female who has above history reviewed by me today presents for follow up visit for ER negative HER2 positive breast cancer treatment.  She has no showed to her July chemotherapy appointment x 2 times. She denies any diarrhea episodes since last visit.Marland Kitchen No fever or chills.  + lower extremity cramps.  + right shoulder pain, back pain and right upper quadrant pain. Bone scan is negative.  Patient follows up with pain clinic.  Recent influenza, symptoms have resolved.   MEDICAL HISTORY:  Past Medical History:  Diagnosis Date   Anemia    Anxiety    Arthritis    Asthma    WELL CONTROLLED   Bile acid malabsorption syndrome    Bradycardia    HAD AN ISSUE WITH THIS IN 2016-NO PROBLEMS SINCE   Breast cancer, left breast (HCC) 08/2021   Carpal tunnel syndrome on right    Depression    Diabetes mellitus without complication (HCC)    Diverticulitis    Gastric reflux    GERD (gastroesophageal reflux disease)    Hand pain 05/12/2016   Headache    H/O MIGRAINES   History of kidney stones    H/O   Lumbar radiculitis    Neck pain, chronic    Osteoarthritis of both knees    Panic attacks    Personal history of chemotherapy    Personal history of radiation therapy     SURGICAL HISTORY: Past Surgical History:  Procedure Laterality Date    BREAST BIOPSY Left 08/21/2021   Axilla Bx, Hydromarker, neg   BREAST BIOPSY Left 08/21/2021   [place] Bx, Ribbon Clip, invasive mammary carcinoma   BREAST BIOPSY Left 03/12/2022   [place] LT RADIO FREQUENCY TAG LOC US GUIDE 03/12/2022 [facility]   BREAST BIOPSY Left 03/12/2022   [place] LT RADIO FREQUENCY TAG EA ADD LESION LOC US GUIDE 03/12/2022 [facility]   BREAST LUMPECTOMY Left 03/27/2022   CARPAL TUNNEL RELEASE Right 12/06/2017   Procedure: CARPAL TUNNEL RELEASE;  Surgeon: Deeann Saint, MD;  Location: [facility] ORS;  Service: Orthopedics;  Laterality: Right;   COLONOSCOPY WITH PROPOFOL N/A 06/11/2021   Procedure: COLONOSCOPY WITH PROPOFOL;  Surgeon: Toney Reil, MD;  Location: [facility] ENDOSCOPY;  Service: Gastroenterology;  Laterality: N/A;   DORSAL COMPARTMENT RELEASE Right 12/06/2017   Procedure: RELEASE DORSAL COMPARTMENT (DEQUERVAIN);  Surgeon: Deeann Saint, MD;  Location: [facility] ORS;  Service: Orthopedics;  Laterality: Right;   ESOPHAGOGASTRODUODENOSCOPY (EGD) WITH PROPOFOL N/A 06/11/2021   Procedure: ESOPHAGOGASTRODUODENOSCOPY (EGD) WITH PROPOFOL;  Surgeon: Toney Reil, MD;  Location[facility] ENDOSCOPY;  Service: Gastroenterology;  Laterality: N/A;   PART MASTECTOMY,RADIO FREQUENCY LOCALIZER,AXILLARY SENTINEL NODE BIOPSY Left 03/27/2022   Procedure: PART MASTECTOMY,RADIO FREQUENCY LOCALIZER,AXILLARY SENTINEL NODE BIOPSY;  Surgeon: Carolan Shiver, MD;  Location: [facility] ORS;  Service: General;  Laterality: Left;   PARTIAL HYSTERECTOMY     PORTACATH PLACEMENT N/A 09/24/2021   Procedure: INSERTION PORT-A-CATH;  Surgeon: Carolan Shiver, MD;  Location: [facility] ORS;  Service: General;  Laterality: N/A;   TUBAL LIGATION      SOCIAL HISTORY: Social History   Socioeconomic History   Marital status: Single    Spouse name: Not on file   Number of children: Not on file   Years of education: Not on file   Highest education level: Not on file  Occupational History   Not on  file  Tobacco Use   Smoking status: Former    Types: Cigarettes   Smokeless tobacco: Never  Vaping Use   Vaping status: Never Used  Substance and Sexual Activity   Alcohol use: No   Drug use: No   Sexual activity: Not on file  Other Topics Concern   Not on file  Social History Narrative   Lives alone   Social Determinants of Health   Financial Resource Strain: High Risk (08/15/2021)   Overall Financial Resource Strain (CARDIA)    Difficulty of Paying Living Expenses: Very hard  Food Insecurity: Food Insecurity Present (09/09/2021)  Hunger Vital Sign    Worried About Running Out of Food in the Last Year: Often true    Ran Out of Food in the Last Year: Often true  Transportation Needs: No Transportation Needs (08/15/2021)   PRAPARE - Administrator, Civil Service (Medical): No    Lack of Transportation (Non-Medical): No  Physical Activity: Inactive (08/15/2021)   Exercise Vital Sign    Days of Exercise per Week: 0 days    Minutes of Exercise per Session: 0 min  Stress: Stress Concern Present (08/15/2021)   [employer] of Occupational Health - Occupational Stress Questionnaire    Feeling of Stress : Rather much  Social Connections: Socially Isolated (08/15/2021)   Social Connection and Isolation Panel [NHANES]    Frequency of Communication with Friends and Family: Once a week    Frequency of Social Gatherings with Friends and Family: Once a week    Attends Religious Services: Never    Database administrator or Organizations: No    Attends Engineer, structural: Not on file    Marital Status: Never married  Intimate Partner Violence: Not At Risk (08/15/2021)   Humiliation, Afraid, Rape, and Kick questionnaire    Fear of Current or Ex-Partner: No    Emotionally Abused: No    Physically Abused: No    Sexually Abused: No    FAMILY HISTORY: Family History  Problem Relation Age of Onset   Diabetes Mother    Hypertension Mother    Heart failure Mother     Pancreatic cancer Mother 39   Diabetes Father    Hypertension Father    Breast cancer Maternal Aunt    Cervical cancer Maternal Aunt    Lung cancer Son 61    ALLERGIES:  is allergic to bc powder [aspirin-salicylamide-caffeine], doxycycline, gabapentin, hydrocodone-acetaminophen, latex, penicillin g, penicillins, and shellfish allergy.  MEDICATIONS:  Current Outpatient Medications  Medication Sig Dispense Refill   albuterol (PROVENTIL) (2.5 MG/3ML) 0.083% nebulizer solution Take 3 mLs (2.5 mg total) by nebulization every 4 (four) hours as needed for wheezing or shortness of breath. 75 mL 2   albuterol (VENTOLIN HFA) 108 (90 Base) MCG/ACT inhaler Inhale 2 puffs into the lungs every 6 (six) hours as needed for wheezing or shortness of breath. 18 g 0   atorvastatin (LIPITOR) 10 MG tablet Take 10 mg by mouth daily.     Azelastine HCl 137 MCG/SPRAY SOLN Place 1 spray into the nose daily. 30 mL 0   calcium carbonate (OS-CAL - DOSED IN MG OF ELEMENTAL CALCIUM) 1250 (500 Ca) MG tablet Take 1 tablet (1,250 mg total) by mouth daily. 90 tablet 1   cetirizine (ZYRTEC ALLERGY) 10 MG tablet Take 1 tablet (10 mg total) by mouth daily. 90 tablet 0   cyclobenzaprine (FLEXERIL) 10 MG tablet Take 10 mg by mouth 3 (three) times daily.     escitalopram (LEXAPRO) 10 MG tablet Take 1 tablet (10 mg total) by mouth daily. 30 tablet 1   esomeprazole (NEXIUM) 40 MG capsule Take 1 capsule (40 mg total) by mouth daily at 12 noon. 30 capsule 3   FLOVENT DISKUS 250 MCG/ACT AEPB Inhale 1 puff into the lungs daily.     fluticasone (FLONASE) 50 MCG/ACT nasal spray Place 2 sprays into both nostrils daily. 15.8 mL 0   Iron-Vitamin C 65-125 MG TABS Take 1 tablet by mouth daily at 2 PM. 30 tablet 2   KLOR-CON M20 20 MEQ tablet TAKE 1 TABLET BY MOUTH  TWICE A DAY 60 tablet 1   lidocaine-prilocaine (EMLA) cream Apply 1 Application topically as needed. 30 g 12   magic mouthwash (multi-ingredient) oral suspension Swish and spit  5-10 ml by mouth 4 times a day as needed 400 mL 1   magnesium chloride (SLOW-MAG) 64 MG TBEC SR tablet Take 1 tablet (64 mg total) by mouth 2 (two) times daily. 60 tablet 2   montelukast (SINGULAIR) 10 MG tablet Take by mouth.     ondansetron (ZOFRAN) 8 MG tablet Take 1 tablet (8 mg total) by mouth 2 (two) times daily as needed (Nausea or vomiting). Start on the third day after chemotherapy. 30 tablet 1   oxyCODONE-acetaminophen (PERCOCET/ROXICET) 5-325 MG tablet Take 1 tablet by mouth every 6 (six) hours as needed for pain.     prochlorperazine (COMPAZINE) 10 MG tablet Take 1 tablet (10 mg total) by mouth every 6 (six) hours as needed (Nausea or vomiting). 30 tablet 1   tamsulosin (FLOMAX) 0.4 MG CAPS capsule Take 0.4 mg by mouth daily.     zinc gluconate 50 MG tablet Take 50 mg by mouth as needed.     diphenoxylate-atropine (LOMOTIL) 2.5-0.025 MG tablet Take 1 tablet by mouth 4 (four) times daily as needed for diarrhea or loose stools. (Patient not taking: Reported on 10/14/2022) 120 tablet 0   naloxone (NARCAN) nasal spray 4 mg/0.1 mL  (Patient not taking: Reported on 10/14/2022)     silver sulfADIAZINE (SILVADENE) 1 % cream Apply 1 Application topically daily. (Patient not taking: Reported on 11/04/2022) 50 g 1   No current facility-administered medications for this visit.   Facility-Administered Medications Ordered in Other Visits  Medication Dose Route Frequency Provider Last Rate Last Admin   magnesium sulfate IVPB 2 g 50 mL  2 g Intravenous Once Covington, Sarah M, PA-C        Review of Systems  Constitutional:  Negative for appetite change, chills, fatigue and fever.  HENT:   Negative for hearing loss and voice change.   Eyes:  Negative for eye problems.  Respiratory:  Negative for chest tightness and cough.   Cardiovascular:  Negative for chest pain.  Gastrointestinal:  Negative for abdominal distention, abdominal pain, blood in stool and diarrhea.  Endocrine: Negative for hot  flashes.  Genitourinary:  Negative for difficulty urinating and frequency.   Musculoskeletal:  Positive for arthralgias and back pain.       Leg cramps  Skin:  Negative for itching and rash.  Neurological:  Negative for extremity weakness and headaches.  Hematological:  Negative for adenopathy.  Psychiatric/Behavioral:  Negative for confusion.      PHYSICAL EXAMINATION: ECOG PERFORMANCE STATUS: 1 - Symptomatic but completely ambulatory Vitals:   12/02/22 0858  BP: 120/61  Pulse: 66  Resp: 18  Temp: (!) 97.5 F (36.4 C)   Filed Weights   12/02/22 0858  Weight: 231 lb 11.2 oz (105.1 kg)    Physical Exam Constitutional:      General: She is not in acute distress.    Appearance: She is not diaphoretic.  HENT:     Head: Normocephalic and atraumatic.  Eyes:     General: No scleral icterus. Cardiovascular:     Rate and Rhythm: Normal rate and regular rhythm.     Heart sounds: No murmur heard. Pulmonary:     Effort: Pulmonary effort is normal. No respiratory distress.     Breath sounds: No wheezing.  Abdominal:     General: There is no distension.  Palpations: Abdomen is soft.     Tenderness: There is no abdominal tenderness.  Musculoskeletal:        General: Normal range of motion.     Cervical back: Normal range of motion and neck supple.  Skin:    General: Skin is warm and dry.     Findings: No erythema.  Neurological:     Mental Status: She is alert and oriented to person, place, and time.     Cranial Nerves: No cranial nerve deficit.     Motor: No abnormal muscle tone.     Coordination: Coordination normal.  Psychiatric:        Mood and Affect: Affect normal.     Comments: Anxious         LABORATORY DATA:  I have reviewed the data as listed    Latest Ref Rng & Units 12/02/2022    8:40 AM 11/04/2022    9:10 AM 10/14/2022    8:18 AM  CBC  WBC 4.0 - 10.5 K/uL 5.0  4.7  4.6   Hemoglobin 12.0 - 15.0 g/dL 9.9  65.7  9.7   Hematocrit 36.0 - 46.0 %  31.5  31.7  32.1   Platelets 150 - 400 K/uL 244  227  266       Latest Ref Rng & Units 12/02/2022    8:40 AM 11/04/2022    9:10 AM 10/14/2022    8:18 AM  CMP  Glucose 70 - 99 mg/dL 846  962  952   BUN 6 - 20 mg/dL 16  18  13    Creatinine 0.44 - 1.00 mg/dL 8.41  3.24  4.01   Sodium 135 - 145 mmol/L 137  137  134   Potassium 3.5 - 5.1 mmol/L 4.2  3.8  4.1   Chloride 98 - 111 mmol/L 105  104  103   CO2 22 - 32 mmol/L 27  24  25    Calcium 8.9 - 10.3 mg/dL 9.0  9.0  8.6   Total Protein 6.5 - 8.1 g/dL 7.0  7.3  6.8   Total Bilirubin 0.3 - 1.2 mg/dL 0.2  0.5  0.3   Alkaline Phos 38 - 126 U/L 98  96  97   AST 15 - 41 U/L 20  21  20    ALT 0 - 44 U/L 16  16  15        RADIOGRAPHIC STUDIES: I have personally reviewed the radiological images as listed and agreed with the findings in the report. US BREAST ASPIRATION LEFT  Result Date: 11/19/2022 CLINICAL DATA:  Patient presents for ultrasound-guided aspiration with potential conversion to biopsy of a LEFT breast seroma. Patient underwent lumpectomy in February 2024. Patient underwent neoadjuvant chemotherapy prior to lumpectomy after biopsy in July 2023 which demonstrated invasive mammary carcinoma at 3 o'clock 6 cm from nipple as well as nodal disease within the axilla. Lumpectomy and axillary node specimen demonstrated no residual disease. EXAM: ULTRASOUND GUIDED LEFT BREAST CYST ASPIRATION COMPARISON:  Previous exam(s). PROCEDURE: The patient and I discussed the procedure of ultrasound-guided aspiration including benefits and alternatives. We discussed the high likelihood of a successful procedure. We discussed the risks of the procedure including infection, bleeding, tissue injury, and inadequate sampling. Informed written consent was given. The usual time out protocol was performed immediately prior to the procedure. During setup for today's exam, no suspicious masslike area was identified within the seroma. Mobile debris with identified  throughout the seroma. As such, symptomatic aspiration of the painful  LEFT breast seroma was performed. Using sterile technique, 1% lidocaine, under direct ultrasound visualization, needle aspiration of a seroma at 2:30 5 cm from the nipple was performed. Approximately 35 ML of brown fluid was aspirated. Seroma aspirated to completion. Aspirated fluid has a benign appearance so was discarded. IMPRESSION: Ultrasound-guided aspiration of a LEFT breast seroma. No apparent complications. RECOMMENDATIONS: Recommend bilateral diagnostic mammogram (with RIGHT and LEFT breast ultrasound if deemed necessary) in 1 year per lumpectomy protocol. BI-RADS: 2: Benign. Electronically Signed   By: Meda Klinefelter M.D.   On: 11/19/2022 11:04   [place] LIMITED ULTRASOUND INCLUDING AXILLA LEFT BREAST   Result Date: 11/11/2022 CLINICAL DATA:  Here for evaluation of the left breast which demonstrated increased uptake on nuclear medicine whole-body bone scan dated 10/20/2022. The patient underwent left lumpectomy in February of 2024. The patient is also due for annual mammogram of the right breast. EXAM: DIGITAL DIAGNOSTIC BILATERAL MAMMOGRAM WITH TOMOSYNTHESIS AND CAD; ULTRASOUND LEFT BREAST LIMITED TECHNIQUE: Bilateral digital diagnostic mammography and breast tomosynthesis was performed. The images were evaluated with computer-aided detection. ; Targeted ultrasound examination of the left breast was performed. COMPARISON:  Previous exam(s). ACR Breast Density Category b: There are scattered areas of fibroglandular density. FINDINGS: Post lumpectomy changes are seen in the left breast with a postoperative seroma measuring 4.4 cm. No suspicious mass, microcalcification, or other finding is identified in either breast. Targeted left breast ultrasound was performed. At 2:30 o'clock 5 cm from the nipple a large fluid collection measures 4.3 x 2.6 x 4.4 cm. This corresponds to the 4.4 cm mass seen on the mammogram and likely reflects a  postoperative seroma. Within this fluid collection there are two distinct areas of hypoechogenicity, one of which appears to be layering debris and the other which appears to reflect a solid mass. The apparent solid mass measures 1.7 x 0.8 x 1.2 cm and likely contains internal cystic spaces. IMPRESSION: Large fluid collection, likely reflecting a postoperative seroma, with possible internal debris and possible solid mass is suspicious for malignancy. RECOMMENDATION: Recommend ultrasound-guided cyst aspiration and possible biopsy. I have discussed the findings and recommendations with the patient. If applicable, a reminder letter will be sent to the patient regarding the next appointment. BI-RADS CATEGORY  4: Suspicious. Electronically Signed   By: Romona Curls M.D.   On: 11/11/2022 14:04   MM 3D DIAGNOSTIC MAMMOGRAM BILATERAL BREAST  Result Date: 11/11/2022 CLINICAL DATA:  Here for evaluation of the left breast which demonstrated increased uptake on nuclear medicine whole-body bone scan dated 10/20/2022. The patient underwent left lumpectomy in February of 2024. The patient is also due for annual mammogram of the right breast. EXAM: DIGITAL DIAGNOSTIC BILATERAL MAMMOGRAM WITH TOMOSYNTHESIS AND CAD; ULTRASOUND LEFT BREAST LIMITED TECHNIQUE: Bilateral digital diagnostic mammography and breast tomosynthesis was performed. The images were evaluated with computer-aided detection. ; Targeted ultrasound examination of the left breast was performed. COMPARISON:  Previous exam(s). ACR Breast Density Category b: There are scattered areas of fibroglandular density. FINDINGS: Post lumpectomy changes are seen in the left breast with a postoperative seroma measuring 4.4 cm. No suspicious mass, microcalcification, or other finding is identified in either breast. Targeted left breast ultrasound was performed. At 2:30 o'clock 5 cm from the nipple a large fluid collection measures 4.3 x 2.6 x 4.4 cm. This corresponds to the 4.4  cm mass seen on the mammogram and likely reflects a postoperative seroma. Within this fluid collection there are two distinct areas of hypoechogenicity, one of which appears to  be layering debris and the other which appears to reflect a solid mass. The apparent solid mass measures 1.7 x 0.8 x 1.2 cm and likely contains internal cystic spaces. IMPRESSION: Large fluid collection, likely reflecting a postoperative seroma, with possible internal debris and possible solid mass is suspicious for malignancy. RECOMMENDATION: Recommend ultrasound-guided cyst aspiration and possible biopsy. I have discussed the findings and recommendations with the patient. If applicable, a reminder letter will be sent to the patient regarding the next appointment. BI-RADS CATEGORY  4: Suspicious. Electronically Signed   By: Romona Curls M.D.   On: 11/11/2022 14:04

## 2022-12-02 NOTE — Assessment & Plan Note (Signed)
CT abdomen pelvis w contrast for further evaluation,.

## 2022-12-02 NOTE — Telephone Encounter (Signed)
Referral faxed to [facility] orthopedic  Re: shoulder pain, back pain   Fax confirmation received.

## 2022-12-02 NOTE — Assessment & Plan Note (Signed)
Left breast cancer, cT4b N3 Mx, ER/PR-, HER2 + S/p neoadjuvant chemotherapy 6 cycles of TCHP  S/p left mastectomy + SLNB -ypT0 ypN0, complete pathological response.  Recommend adjuvant Transtuzumab and Pertuzumab to complete 1 year treatment if she tolerates.  S/p adjuvant radiation- last RT 7/1  Labs reviewed and discussed with patient.   Proceed with trastuzumab and pertuzumab today.   Sept 2024 echocardiogram- stable LVEF- repeat echo in Nov/Dec  Recommend adjuvant Zometa- she declined.  Focal uptake of the left breast on bone scan.  Possible scarring tissue.  Repeat left unilateral mammogram.

## 2022-12-02 NOTE — Assessment & Plan Note (Signed)
Refer to [facility] for behavior health

## 2022-12-09 ENCOUNTER — Ambulatory Visit: Payer: Medicare HMO

## 2022-12-11 NOTE — Telephone Encounter (Signed)
Recevied fax from [facility] ortho stating that pt was dismissed form their clinic in 2009.   Referral sent to [facility].

## 2022-12-16 ENCOUNTER — Ambulatory Visit: Payer: Medicare HMO | Admitting: Oncology

## 2022-12-16 ENCOUNTER — Other Ambulatory Visit: Payer: Medicare HMO

## 2022-12-16 ENCOUNTER — Ambulatory Visit: Payer: Medicare HMO

## 2022-12-16 ENCOUNTER — Encounter: Payer: Self-pay | Admitting: Oncology

## 2022-12-23 ENCOUNTER — Inpatient Hospital Stay: Payer: Medicare HMO

## 2022-12-23 ENCOUNTER — Inpatient Hospital Stay: Payer: Medicare HMO | Attending: Oncology | Admitting: Oncology

## 2022-12-23 ENCOUNTER — Encounter: Payer: Self-pay | Admitting: Oncology

## 2022-12-23 ENCOUNTER — Encounter: Payer: Self-pay | Admitting: *Deleted

## 2022-12-23 VITALS — BP 104/67 | HR 62 | Resp 18

## 2022-12-23 VITALS — BP 111/43 | HR 66 | Temp 96.4°F | Resp 18 | Wt 231.2 lb

## 2022-12-23 DIAGNOSIS — C50911 Malignant neoplasm of unspecified site of right female breast: Secondary | ICD-10-CM

## 2022-12-23 DIAGNOSIS — Z5112 Encounter for antineoplastic immunotherapy: Secondary | ICD-10-CM | POA: Insufficient documentation

## 2022-12-23 DIAGNOSIS — G8929 Other chronic pain: Secondary | ICD-10-CM

## 2022-12-23 DIAGNOSIS — Z171 Estrogen receptor negative status [ER-]: Secondary | ICD-10-CM

## 2022-12-23 DIAGNOSIS — M545 Low back pain, unspecified: Secondary | ICD-10-CM

## 2022-12-23 DIAGNOSIS — E876 Hypokalemia: Secondary | ICD-10-CM | POA: Diagnosis not present

## 2022-12-23 DIAGNOSIS — M549 Dorsalgia, unspecified: Secondary | ICD-10-CM | POA: Insufficient documentation

## 2022-12-23 DIAGNOSIS — D649 Anemia, unspecified: Secondary | ICD-10-CM | POA: Insufficient documentation

## 2022-12-23 DIAGNOSIS — C50912 Malignant neoplasm of unspecified site of left female breast: Secondary | ICD-10-CM | POA: Diagnosis present

## 2022-12-23 DIAGNOSIS — R1011 Right upper quadrant pain: Secondary | ICD-10-CM | POA: Insufficient documentation

## 2022-12-23 LAB — CMP (CANCER CENTER ONLY)
ALT: 20 U/L (ref 0–44)
AST: 16 U/L (ref 15–41)
Albumin: 3.4 g/dL — ABNORMAL LOW (ref 3.5–5.0)
Alkaline Phosphatase: 104 U/L (ref 38–126)
Anion gap: 8 (ref 5–15)
BUN: 21 mg/dL — ABNORMAL HIGH (ref 6–20)
CO2: 24 mmol/L (ref 22–32)
Calcium: 8.8 mg/dL — ABNORMAL LOW (ref 8.9–10.3)
Chloride: 106 mmol/L (ref 98–111)
Creatinine: 0.97 mg/dL (ref 0.44–1.00)
GFR, Estimated: >60 mL/min (ref >60)
Glucose, Bld: 101 mg/dL — ABNORMAL HIGH (ref 70–99)
Potassium: 3.9 mmol/L (ref 3.5–5.1)
Sodium: 138 mmol/L (ref 135–145)
Total Bilirubin: 0.3 mg/dL (ref <1.2)
Total Protein: 6.8 g/dL (ref 6.5–8.1)

## 2022-12-23 LAB — CBC (CANCER CENTER ONLY)
HCT: 31.8 % — ABNORMAL LOW (ref 36.0–46.0)
Hemoglobin: 9.8 g/dL — ABNORMAL LOW (ref 12.0–15.0)
MCH: 25.9 pg — ABNORMAL LOW (ref 26.0–34.0)
MCHC: 30.8 g/dL (ref 30.0–36.0)
MCV: 84.1 fL (ref 80.0–100.0)
Platelet Count: 231 10*3/uL (ref 150–400)
RBC: 3.78 MIL/uL — ABNORMAL LOW (ref 3.87–5.11)
RDW: 15.3 % (ref 11.5–15.5)
WBC Count: 6.6 10*3/uL (ref 4.0–10.5)
nRBC: 0 % (ref 0.0–0.2)

## 2022-12-23 LAB — MAGNESIUM: Magnesium: 1.7 mg/dL (ref 1.7–2.4)

## 2022-12-23 MED ORDER — SODIUM CHLORIDE 0.9 % IV SOLN
420.0000 mg | Freq: Once | INTRAVENOUS | Status: AC
  Administered 2022-12-23: 420 mg via INTRAVENOUS
  Filled 2022-12-23: qty 14

## 2022-12-23 MED ORDER — SODIUM CHLORIDE 0.9 % IV SOLN
Freq: Once | INTRAVENOUS | Status: AC
  Filled 2022-12-23: qty 250

## 2022-12-23 MED ORDER — HEPARIN SOD (PORK) LOCK FLUSH 100 UNIT/ML IV SOLN
500.0000 [IU] | Freq: Once | INTRAVENOUS | Status: AC | PRN
  Administered 2022-12-23: 500 [IU]
  Filled 2022-12-23: qty 5

## 2022-12-23 MED ORDER — ACETAMINOPHEN 325 MG PO TABS
650.0000 mg | ORAL_TABLET | Freq: Once | ORAL | Status: AC
  Administered 2022-12-23: 650 mg via ORAL
  Filled 2022-12-23: qty 2

## 2022-12-23 MED ORDER — DIPHENHYDRAMINE HCL 25 MG PO CAPS
50.0000 mg | ORAL_CAPSULE | Freq: Once | ORAL | Status: AC
  Administered 2022-12-23: 50 mg via ORAL
  Filled 2022-12-23: qty 2

## 2022-12-23 MED ORDER — TRASTUZUMAB-ANNS CHEMO 150 MG IV SOLR
6.0000 mg/kg | Freq: Once | INTRAVENOUS | Status: AC
  Administered 2022-12-23: 600 mg via INTRAVENOUS
  Filled 2022-12-23: qty 28.57

## 2022-12-23 NOTE — Assessment & Plan Note (Signed)
She is finishing adjuvant therapy. Continue Slow Mag 1 tab BID, till end of this year.

## 2022-12-23 NOTE — Assessment & Plan Note (Signed)
Hemoglobin is stable. Monitor counts.

## 2022-12-23 NOTE — Assessment & Plan Note (Signed)
continue potassium chloride 20meq twice daily.  

## 2022-12-23 NOTE — Progress Notes (Signed)
Hematology/Oncology Progress note [phone] [phone]     CHIEF COMPLAINTS/REASON FOR VISIT:  Follow-up for Stage IIIB left breast cancer, ER-, HER2+  ASSESSMENT & PLAN:   Cancer Staging  Breast cancer [facility]) Staging form: Breast, AJCC 8th Edition - Clinical stage from 08/21/2021: Stage IIIB (cT4b, cN3c, cM0, G3, ER-, PR-, HER2+) - Signed by [name], MD on 09/12/2021  Breast cancer (HCC) Left breast cancer, cT4b N3 Mx, ER/PR-, HER2 + S/p neoadjuvant chemotherapy 6 cycles of TCHP  S/p left mastectomy + SLNB -ypT0 ypN0, complete pathological response.  Recommend adjuvant Transtuzumab and Pertuzumab 11 cycles.  S/p adjuvant radiation-   Labs reviewed and discussed with patient.   Proceed with trastuzumab and pertuzumab today.  Last treatment today.  Sept 2024 echocardiogram- stable LVEF-   Recommend adjuvant Zometa- she declined.  Left breast seroma s/p aspiration.   Back pain Chronic issue for her.  Xray was not done, per patient her pain doctor has done the xray.  Result was scanned in media.  Lumbar xray showed stable spondylosis and spondylolisthesis.  She has multiple bone pain, -  bone scan-negative for metastatic disease.  Patient has degenerative disease.     Hypokalemia continue potassium chloride twice daily.   Normocytic anemia Hemoglobin is stable. Monitor counts.   Hypomagnesemia She is finishing adjuvant therapy. Continue Slow Mag 1 tab BID, till end of this year.   Right upper quadrant pain CT abdomen pelvis w contrast for further evaluation,.    Orders Placed This Encounter  Procedures   Cancer antigen 27.29    Standing Status:   Future    Standing Expiration Date:   12/23/2023   Cancer antigen 15-3    Standing Status:   Future    Standing Expiration Date:   12/23/2023   CBC with Differential (Cancer Center Only)    Standing Status:   Future    Standing Expiration Date:   12/23/2023   CMP (Cancer Center only)     Standing Status:   Future    Standing Expiration Date:   12/23/2023   Magnesium    Standing Status:   Future    Standing Expiration Date:   12/23/2023   Follow up in 3 weeks.  All questions were answered. The patient knows to call the clinic with any problems questions or concerns.  Return of visit:  3 weeks lab MD Transtuzumab + Pertuzumab +/- IV magnesium  [name], MD, PhD [facility] Hematology Oncology 12/23/2022      HISTORY OF PRESENTING ILLNESS:  Vanessa Fisher is a  62 y.o.  female with PMH listed below who was referred to me for evaluation of  left breast cancer SUMMARY OF ONCOLOGIC HISTORY: Oncology History  Breast cancer (HCC)  07/31/2021 Imaging   Bilateral diagnostic mammogram and US showed 5 centimeter LEFT breast mass associated with pleomorphic calcifications is suspicious for invasive ductal carcinoma.At least 4 LEFT axillary lymph nodes with abnormal morphology   08/21/2021 Cancer Staging   Staging form: Breast, AJCC 8th Edition - Clinical: Stage IIIB (cT4b, cN3c, cM0, G3, ER-, PR-, HER2+) - Signed by [name], MD on 08/28/2021 Histologic grading system: 3 grade system  -08/21/21 left breast ultrasound-guided biopsy showed invasive mammary carcinoma, grade 3, ER/PR negative, HER2 positive.  Left axillary lymph node biopsy positive for macro metastatic mammary carcinoma, 8 mm in greatest extent.   08/30/2021 Imaging   MRI brain w wo contrast -No metastatic disease or acute intracranial abnormality. Essentially normal for age  MRI appearance of the brain.    09/03/2021 Echocardiogram   1. Left ventricular ejection fraction, by estimation, is 55 to 60%. Left ventricular ejection fraction by 3D volume is 58 %. The left ventricle has normal function. The left ventricle has no regional wall motion abnormalities. There is mild left  ventricular hypertrophy. Left ventricular diastolic parameters were normal.  2. Right ventricular systolic function is normal. The  right ventricular size is normal.  3. The mitral valve is normal in structure. No evidence of mitral valve regurgitation.  4. The aortic valve was not well visualized. Aortic valve regurgitation is not visualized   09/10/2021 Imaging   PET scan showed Large hypermetabolic left breast mass and diffuse skin thickening,consistent with primary breast carcinoma. Hypermetabolic lymphadenopathy in the left axilla, left subpectoral region, and left supraclavicular region, consistent with metastatic disease. No evidence of metastatic disease within the abdomen or pelvis   09/12/2021 - 02/13/2022 Chemotherapy   Patient is on Treatment Plan : BREAST  Docetaxel + Carboplatin + Trastuzumab + Pertuzumab  (TCHP) q21d       Genetic Testing   Negative genetic testing. No pathogenic variants identified on the [facility] Common Hereditary Cancers+RNA panel. The report date is 10/26/2021.  The Common Hereditary Cancers Panel + RNA offered by [facility] includes sequencing and/or deletion duplication testing of the following 47 genes: APC, ATM, AXIN2, BARD1, BMPR1A, BRCA1, BRCA2, BRIP1, CDH1, CDKN2A (p14ARF), CDKN2A (p16INK4a), CKD4, CHEK2, CTNNA1, DICER1, EPCAM (Deletion/duplication testing only), GREM1 (promoter region deletion/duplication testing only), KIT, MEN1, MLH1, MSH2, MSH3, MSH6, MUTYH, NBN, NF1, NHTL1, PALB2, PDGFRA, PMS2, POLD1, POLE, PTEN, RAD50, RAD51C, RAD51D, SDHB, SDHC, SDHD, SMAD4, SMARCA4. STK11, TP53, TSC1, TSC2, and VHL.  The following genes were evaluated for sequence changes only: SDHA and HOXB13 c.251G>A variant only.   02/09/2022 Echocardiogram   LVEF 55 to 60%    03/27/2022 Surgery   S/p left mastectomy + SLNB ypT0 ypN0,    04/24/2022 -  Chemotherapy   Patient is on Treatment Plan : BREAST Trastuzumab  + Pertuzumab q21d x 13 cycles     05/06/2022 Echocardiogram   LVEF 55 to 60%    07/02/2022 - 08/03/2022 Radiation Therapy   Adjuvant breast radiation.    10/20/2022 Imaging   Bone scan whole body  showed 1. Degenerative uptake at the sternoclavicular joints, shoulders,knees and hindfoot bilaterally. 2. No evidence for metastatic disease to the. 3. Uptake in the left breast may be related to postsurgical change or residual tumor.    + chronic nasal pressure, congestion, horse voice she attributes to sinusitis.   INTERVAL HISTORY Vanessa Fisher is a 63 y.o. female who has above history reviewed by me today presents for follow up visit for ER negative HER2 positive breast cancer treatment.  She has no showed to her July chemotherapy appointment x 2 times. She denies any diarrhea episodes since last visit.Marland Kitchen No fever or chills.  + lower extremity cramps.  + right shoulder pain, back pain and right upper quadrant pain. Bone scan is negative.  Patient follows up with pain clinic. Recent influenza, symptoms have resolved.   MEDICAL HISTORY:  Past Medical History:  Diagnosis Date   Anemia    Anxiety    Arthritis    Asthma    WELL CONTROLLED   Bile acid malabsorption syndrome    Bradycardia    HAD AN ISSUE WITH THIS IN 2016-NO PROBLEMS SINCE   Breast cancer, left breast (HCC) 08/2021   Carpal tunnel syndrome on right  Depression    Diabetes mellitus without complication (HCC)    Diverticulitis    Gastric reflux    GERD (gastroesophageal reflux disease)    Hand pain 05/12/2016   Headache    H/O MIGRAINES   History of kidney stones    H/O   Lumbar radiculitis    Neck pain, chronic    Osteoarthritis of both knees    Panic attacks    Personal history of chemotherapy    Personal history of radiation therapy     SURGICAL HISTORY: Past Surgical History:  Procedure Laterality Date   BREAST BIOPSY Left 08/21/2021   Axilla Bx, Hydromarker, neg   BREAST BIOPSY Left 08/21/2021   [place] Bx, Ribbon Clip, invasive mammary carcinoma   BREAST BIOPSY Left 03/12/2022   [place] LT RADIO FREQUENCY TAG LOC US GUIDE 03/12/2022 [facility]   BREAST BIOPSY Left 03/12/2022   [place] LT  RADIO FREQUENCY TAG EA ADD LESION LOC US GUIDE 03/12/2022 [facility]   BREAST LUMPECTOMY Left 03/27/2022   CARPAL TUNNEL RELEASE Right 12/06/2017   Procedure: CARPAL TUNNEL RELEASE;  Surgeon: Deeann Saint, MD;  Location: [facility] ORS;  Service: Orthopedics;  Laterality: Right;   COLONOSCOPY WITH PROPOFOL N/A 06/11/2021   Procedure: COLONOSCOPY WITH PROPOFOL;  Surgeon: Toney Reil, MD;  Location: [facility] ENDOSCOPY;  Service: Gastroenterology;  Laterality: N/A;   DORSAL COMPARTMENT RELEASE Right 12/06/2017   Procedure: RELEASE DORSAL COMPARTMENT (DEQUERVAIN);  Surgeon: Deeann Saint, MD;  Location: [facility] ORS;  Service: Orthopedics;  Laterality: Right;   ESOPHAGOGASTRODUODENOSCOPY (EGD) WITH PROPOFOL N/A 06/11/2021   Procedure: ESOPHAGOGASTRODUODENOSCOPY (EGD) WITH PROPOFOL;  Surgeon: Toney Reil, MD;  Location: [facility] ENDOSCOPY;  Service: Gastroenterology;  Laterality: N/A;   PART MASTECTOMY,RADIO FREQUENCY LOCALIZER,AXILLARY SENTINEL NODE BIOPSY Left 03/27/2022   Procedure: PART MASTECTOMY,RADIO FREQUENCY LOCALIZER,AXILLARY SENTINEL NODE BIOPSY;  Surgeon: Carolan Shiver, MD;  Location: [facility] ORS;  Service: General;  Laterality: Left;   PARTIAL HYSTERECTOMY     PORTACATH PLACEMENT N/A 09/24/2021   Procedure: INSERTION PORT-A-CATH;  Surgeon: Carolan Shiver, MD;  Location: [facility] ORS;  Service: General;  Laterality: N/A;   TUBAL LIGATION      SOCIAL HISTORY: Social History   Socioeconomic History   Marital status: Single    Spouse name: Not on file   Number of children: Not on file   Years of education: Not on file   Highest education level: Not on file  Occupational History   Not on file  Tobacco Use   Smoking status: Former    Types: Cigarettes   Smokeless tobacco: Never  Vaping Use   Vaping status: Never Used  Substance and Sexual Activity   Alcohol use: No   Drug use: No   Sexual activity: Not on file  Other Topics Concern   Not on file  Social History  Narrative   Lives alone   Social Determinants of Health   Financial Resource Strain: High Risk (08/15/2021)   Overall Financial Resource Strain (CARDIA)    Difficulty of Paying Living Expenses: Very hard  Food Insecurity: Food Insecurity Present (09/09/2021)   Hunger Vital Sign    Worried About Running Out of Food in the Last Year: Often true    Ran Out of Food in the Last Year: Often true  Transportation Needs: No Transportation Needs (08/15/2021)   PRAPARE - Administrator, Civil Service (Medical): No    Lack of Transportation (Non-Medical): No  Physical Activity: Inactive (08/15/2021)   Exercise Vital Sign    Days of  Exercise per Week: 0 days    Minutes of Exercise per Session: 0 min  Stress: Stress Concern Present (08/15/2021)   [employer] of Occupational Health - Occupational Stress Questionnaire    Feeling of Stress : Rather much  Social Connections: Socially Isolated (08/15/2021)   Social Connection and Isolation Panel [NHANES]    Frequency of Communication with Friends and Family: Once a week    Frequency of Social Gatherings with Friends and Family: Once a week    Attends Religious Services: Never    Database administrator or Organizations: No    Attends Engineer, structural: Not on file    Marital Status: Never married  Intimate Partner Violence: Not At Risk (08/15/2021)   Humiliation, Afraid, Rape, and Kick questionnaire    Fear of Current or Ex-Partner: No    Emotionally Abused: No    Physically Abused: No    Sexually Abused: No    FAMILY HISTORY: Family History  Problem Relation Age of Onset   Diabetes Mother    Hypertension Mother    Heart failure Mother    Pancreatic cancer Mother 9   Diabetes Father    Hypertension Father    Breast cancer Maternal Aunt    Cervical cancer Maternal Aunt    Lung cancer Son 1    ALLERGIES:  is allergic to bc powder [aspirin-salicylamide-caffeine], doxycycline, gabapentin, hydrocodone-acetaminophen,  latex, penicillin g, penicillins, and shellfish allergy.  MEDICATIONS:  Current Outpatient Medications  Medication Sig Dispense Refill   albuterol (PROVENTIL) (2.5 MG/3ML) 0.083% nebulizer solution Take 3 mLs (2.5 mg total) by nebulization every 4 (four) hours as needed for wheezing or shortness of breath. 75 mL 2   albuterol (VENTOLIN HFA) 108 (90 Base) MCG/ACT inhaler Inhale 2 puffs into the lungs every 6 (six) hours as needed for wheezing or shortness of breath. 18 g 0   atorvastatin (LIPITOR) 10 MG tablet Take 10 mg by mouth daily.     Azelastine HCl 137 MCG/SPRAY SOLN Place 1 spray into the nose daily. 30 mL 0   calcium carbonate (OS-CAL - DOSED IN MG OF ELEMENTAL CALCIUM) 1250 (500 Ca) MG tablet Take 1 tablet (1,250 mg total) by mouth daily. 90 tablet 1   cetirizine (ZYRTEC ALLERGY) 10 MG tablet Take 1 tablet (10 mg total) by mouth daily. 90 tablet 0   cyclobenzaprine (FLEXERIL) 10 MG tablet Take 10 mg by mouth 3 (three) times daily.     escitalopram (LEXAPRO) 10 MG tablet Take 1 tablet (10 mg total) by mouth daily. 30 tablet 1   esomeprazole (NEXIUM) 40 MG capsule Take 1 capsule (40 mg total) by mouth daily at 12 noon. 30 capsule 3   FLOVENT DISKUS 250 MCG/ACT AEPB Inhale 1 puff into the lungs daily.     fluticasone (FLONASE) 50 MCG/ACT nasal spray Place 2 sprays into both nostrils daily. 15.8 mL 0   Iron-Vitamin C 65-125 MG TABS Take 1 tablet by mouth daily at 2 PM. 30 tablet 2   KLOR-CON M20 20 MEQ tablet TAKE 1 TABLET BY MOUTH TWICE A DAY 60 tablet 1   lidocaine-prilocaine (EMLA) cream Apply 1 Application topically as needed. 30 g 12   magic mouthwash (multi-ingredient) oral suspension Swish and spit 5-10 ml by mouth 4 times a day as needed 400 mL 1   magnesium chloride (SLOW-MAG) 64 MG TBEC SR tablet Take 1 tablet (64 mg total) by mouth 2 (two) times daily. 60 tablet 2   montelukast (SINGULAIR) 10 MG  tablet Take by mouth.     ondansetron (ZOFRAN) 8 MG tablet Take 1 tablet (8 mg  total) by mouth 2 (two) times daily as needed (Nausea or vomiting). Start on the third day after chemotherapy. 30 tablet 1   oxyCODONE-acetaminophen (PERCOCET/ROXICET) 5-325 MG tablet Take 1 tablet by mouth every 6 (six) hours as needed for pain.     prochlorperazine (COMPAZINE) 10 MG tablet Take 1 tablet (10 mg total) by mouth every 6 (six) hours as needed (Nausea or vomiting). 30 tablet 1   tamsulosin (FLOMAX) 0.4 MG CAPS capsule Take 0.4 mg by mouth daily.     zinc gluconate 50 MG tablet Take 50 mg by mouth as needed.     diphenoxylate-atropine (LOMOTIL) 2.5-0.025 MG tablet Take 1 tablet by mouth 4 (four) times daily as needed for diarrhea or loose stools. (Patient not taking: Reported on 10/14/2022) 120 tablet 0   naloxone (NARCAN) nasal spray 4 mg/0.1 mL  (Patient not taking: Reported on 10/14/2022)     silver sulfADIAZINE (SILVADENE) 1 % cream Apply 1 Application topically daily. (Patient not taking: Reported on 11/04/2022) 50 g 1   No current facility-administered medications for this visit.   Facility-Administered Medications Ordered in Other Visits  Medication Dose Route Frequency Provider Last Rate Last Admin   magnesium sulfate IVPB 2 g 50 mL  2 g Intravenous Once Covington, Sarah M, PA-C        Review of Systems  Constitutional:  Negative for appetite change, chills, fatigue and fever.  HENT:   Negative for hearing loss and voice change.   Eyes:  Negative for eye problems.  Respiratory:  Negative for chest tightness and cough.   Cardiovascular:  Negative for chest pain.  Gastrointestinal:  Positive for abdominal pain. Negative for abdominal distention, blood in stool and diarrhea.  Endocrine: Negative for hot flashes.  Genitourinary:  Negative for difficulty urinating and frequency.   Musculoskeletal:  Positive for arthralgias and back pain.       Leg cramps  Skin:  Negative for itching and rash.  Neurological:  Negative for extremity weakness and headaches.  Hematological:   Negative for adenopathy.  Psychiatric/Behavioral:  Negative for confusion.      PHYSICAL EXAMINATION: ECOG PERFORMANCE STATUS: 1 - Symptomatic but completely ambulatory Vitals:   12/23/22 0913  BP: (!) 111/43  Pulse: 66  Resp: 18  Temp: (!) 96.4 F (35.8 C)  SpO2: 100%   Filed Weights   12/23/22 0913  Weight: 231 lb 3.2 oz (104.9 kg)    Physical Exam Constitutional:      General: She is not in acute distress.    Appearance: She is not diaphoretic.  HENT:     Head: Normocephalic and atraumatic.  Eyes:     General: No scleral icterus. Cardiovascular:     Rate and Rhythm: Normal rate and regular rhythm.     Heart sounds: No murmur heard. Pulmonary:     Effort: Pulmonary effort is normal. No respiratory distress.     Breath sounds: No wheezing.  Abdominal:     General: There is no distension.     Palpations: Abdomen is soft.     Tenderness: There is no abdominal tenderness.  Musculoskeletal:        General: Normal range of motion.     Cervical back: Normal range of motion and neck supple.  Skin:    General: Skin is warm and dry.     Findings: No erythema.  Neurological:  Mental Status: She is alert and oriented to person, place, and time.     Cranial Nerves: No cranial nerve deficit.     Motor: No abnormal muscle tone.     Coordination: Coordination normal.  Psychiatric:        Mood and Affect: Affect normal.     Comments: Anxious         LABORATORY DATA:  I have reviewed the data as listed    Latest Ref Rng & Units 12/23/2022    8:53 AM 12/02/2022    8:40 AM 11/04/2022    9:10 AM  CBC  WBC 4.0 - 10.5 K/uL 6.6  5.0  4.7   Hemoglobin 12.0 - 15.0 g/dL 9.8  9.9  56.2   Hematocrit 36.0 - 46.0 % 31.8  31.5  31.7   Platelets 150 - 400 K/uL 231  244  227       Latest Ref Rng & Units 12/23/2022    8:53 AM 12/02/2022    8:40 AM 11/04/2022    9:10 AM  CMP  Glucose 70 - 99 mg/dL 130  865  784   BUN 6 - 20 mg/dL 21  16  18    Creatinine 0.44 - 1.00  mg/dL 6.96  2.95  2.84   Sodium 135 - 145 mmol/L 138  137  137   Potassium 3.5 - 5.1 mmol/L 3.9  4.2  3.8   Chloride 98 - 111 mmol/L 106  105  104   CO2 22 - 32 mmol/L 24  27  24    Calcium 8.9 - 10.3 mg/dL 8.8  9.0  9.0   Total Protein 6.5 - 8.1 g/dL 6.8  7.0  7.3   Total Bilirubin <1.2 mg/dL 0.3  0.2  0.5   Alkaline Phos 38 - 126 U/L 104  98  96   AST 15 - 41 U/L 16  20  21    ALT 0 - 44 U/L 20  16  16        RADIOGRAPHIC STUDIES: I have personally reviewed the radiological images as listed and agreed with the findings in the report. No results found.

## 2022-12-23 NOTE — Patient Instructions (Signed)
[facility] - A DEPT OF [facility]  Discharge Instructions: Thank you for choosing [facility] to provide your oncology and hematology care.  If you have a lab appointment with the Cancer Center, please go directly to the Cancer Center and check in at the registration area.  Wear comfortable clothing and clothing appropriate for easy access to any Portacath or PICC line.   We strive to give you quality time with your provider. You may need to reschedule your appointment if you arrive late (15 or more minutes).  Arriving late affects you and other patients whose appointments are after yours.  Also, if you miss three or more appointments without notifying the office, you may be dismissed from the clinic at the provider's discretion.      For prescription refill requests, have your pharmacy contact our office and allow 72 hours for refills to be completed.    Today you received the following chemotherapy and/or immunotherapy agents KANJINTI and PERJETA      To help prevent nausea and vomiting after your treatment, we encourage you to take your nausea medication as directed.  BELOW ARE SYMPTOMS THAT SHOULD BE REPORTED IMMEDIATELY: *FEVER GREATER THAN 100.4 F (38 C) OR HIGHER *CHILLS OR SWEATING *NAUSEA AND VOMITING THAT IS NOT CONTROLLED WITH YOUR NAUSEA MEDICATION *UNUSUAL SHORTNESS OF BREATH *UNUSUAL BRUISING OR BLEEDING *URINARY PROBLEMS (pain or burning when urinating, or frequent urination) *BOWEL PROBLEMS (unusual diarrhea, constipation, pain near the anus) TENDERNESS IN MOUTH AND THROAT WITH OR WITHOUT PRESENCE OF ULCERS (sore throat, sores in mouth, or a toothache) UNUSUAL RASH, SWELLING OR PAIN  UNUSUAL VAGINAL DISCHARGE OR ITCHING   Items with * indicate a potential emergency and should be followed up as soon as possible or go to the Emergency Department if any problems should occur.  Please show the CHEMOTHERAPY ALERT CARD or  IMMUNOTHERAPY ALERT CARD at check-in to the Emergency Department and triage nurse.  Should you have questions after your visit or need to cancel or reschedule your appointment, please contact [facility] - A DEPT OF [facility]  [phone] and follow the prompts.  Office hours are 8:00 a.m. to 4:30 p.m. Monday - Friday. Please note that voicemails left after 4:00 p.m. may not be returned until the following business day.  We are closed weekends and major holidays. You have access to a nurse at all times for urgent questions. Please call the main number to the clinic [phone] and follow the prompts.  For any non-urgent questions, you may also contact your provider using MyChart. We now offer e-Visits for anyone 49 and older to request care online for non-urgent symptoms. For details visit [email or web address].   Also download the MyChart app! Go to the app store, search "MyChart", open the app, select , and log in with your MyChart username and password.  Trastuzumab Injection What is this medication? TRASTUZUMAB (tras TOO zoo mab) treats breast cancer and stomach cancer. It works by blocking a protein that causes cancer cells to grow and multiply. This helps to slow or stop the spread of cancer cells. This medicine may be used for other purposes; ask your health care provider or pharmacist if you have questions. COMMON BRAND NAME(S): Herceptin, Marlowe Alt, Ontruzant, Trazimera What should I tell my care team before I take this medication? They need to know if you have any of these conditions: Heart failure Lung disease An unusual or  allergic reaction to trastuzumab, other medications, foods, dyes, or preservatives Pregnant or trying to get pregnant Breast-feeding How should I use this medication? This medication is injected into a vein. It is given by your care team in a hospital or clinic setting. Talk to your care team about  the use of this medication in children. It is not approved for use in children. Overdosage: If you think you have taken too much of this medicine contact a poison control center or emergency room at once. NOTE: This medicine is only for you. Do not share this medicine with others. What if I miss a dose? Keep appointments for follow-up doses. It is important not to miss your dose. Call your care team if you are unable to keep an appointment. What may interact with this medication? Certain types of chemotherapy, such as daunorubicin, doxorubicin, epirubicin, idarubicin This list may not describe all possible interactions. Give your health care provider a list of all the medicines, herbs, non-prescription drugs, or dietary supplements you use. Also tell them if you smoke, drink alcohol, or use illegal drugs. Some items may interact with your medicine. What should I watch for while using this medication? Your condition will be monitored carefully while you are receiving this medication. This medication may make you feel generally unwell. This is not uncommon, as chemotherapy affects healthy cells as well as cancer cells. Report any side effects. Continue your course of treatment even though you feel ill unless your care team tells you to stop. This medication may increase your risk of getting an infection. Call your care team for advice if you get a fever, chills, sore throat, or other symptoms of a cold or flu. Do not treat yourself. Try to avoid being around people who are sick. Avoid taking medications that contain aspirin, acetaminophen, ibuprofen, naproxen, or ketoprofen unless instructed by your care team. These medications can hide a fever. Talk to your care team if you may be pregnant. Serious birth defects can occur if you take this medication during pregnancy and for 7 months after the last dose. You will need a negative pregnancy test before starting this medication. Contraception is recommended  while taking this medication and for 7 months after the last dose. Your care team can help you find the option that works for you. Do not breastfeed while taking this medication and for 7 months after stopping treatment. What side effects may I notice from receiving this medication? Side effects that you should report to your care team as soon as possible: Allergic reactions or angioedema--skin rash, itching or hives, swelling of the face, eyes, lips, tongue, arms, or legs, trouble swallowing or breathing Dry cough, shortness of breath or trouble breathing Heart failure--shortness of breath, swelling of the ankles, feet, or hands, sudden weight gain, unusual weakness or fatigue Infection--fever, chills, cough, or sore throat Infusion reactions--chest pain, shortness of breath or trouble breathing, feeling faint or lightheaded Side effects that usually do not require medical attention (report to your care team if they continue or are bothersome): Diarrhea Dizziness Headache Nausea Trouble sleeping Vomiting This list may not describe all possible side effects. Call your doctor for medical advice about side effects. You may report side effects to FDA at 1-800-FDA-1088. Where should I keep my medication? This medication is given in a hospital or clinic. It will not be stored at home. NOTE: This sheet is a summary. It may not cover all possible information. If you have questions about this medicine, talk to  your doctor, pharmacist, or health care provider.  2024 Elsevier/Gold Standard (2021-06-03 00:00:00)  Pertuzumab Injection What is this medication? PERTUZUMAB (per TOOZ ue mab) treats breast cancer. It works by blocking a protein that causes cancer cells to grow and multiply. This helps to slow or stop the spread of cancer cells. It is a monoclonal antibody. This medicine may be used for other purposes; ask your health care provider or pharmacist if you have questions. COMMON BRAND NAME(S):  PERJETA What should I tell my care team before I take this medication? They need to know if you have any of these conditions: Heart failure An unusual or allergic reaction to pertuzumab, other medications, foods, dyes, or preservatives Pregnant or trying to get pregnant Breast-feeding How should I use this medication? This medication is injected into a vein. It is given by your care team in a hospital or clinic setting. Talk to your care team about the use of this medication in children. Special care may be needed. Overdosage: If you think you have taken too much of this medicine contact a poison control center or emergency room at once. NOTE: This medicine is only for you. Do not share this medicine with others. What if I miss a dose? Keep appointments for follow-up doses. It is important not to miss your dose. Call your care team if you are unable to keep an appointment. What may interact with this medication? Interactions are not expected. This list may not describe all possible interactions. Give your health care provider a list of all the medicines, herbs, non-prescription drugs, or dietary supplements you use. Also tell them if you smoke, drink alcohol, or use illegal drugs. Some items may interact with your medicine. What should I watch for while using this medication? Your condition will be monitored carefully while you are receiving this medication. This medication may make you feel generally unwell. This is not uncommon as chemotherapy can affect healthy cells as well as cancer cells. Report any side effects. Continue your course of treatment even though you feel ill unless your care team tells you to stop. Talk to your care team if you may be pregnant. Serious birth defects can occur if you take this medication during pregnancy and for 7 months after the last dose. You will need a negative pregnancy test before starting this medication. Contraception is recommended while taking this  medication and for 7 months after the last dose. Your care team can help you find the option that works for you. Do not breastfeed while taking this medication and for 7 months after the last dose. What side effects may I notice from receiving this medication? Side effects that you should report to your care team as soon as possible: Allergic reactions or angioedema--skin rash, itching or hives, swelling of the face, eyes, lips, tongue, arms, or legs, trouble swallowing or breathing Heart failure--shortness of breath, swelling of the ankles, feet, or hands, sudden weight gain, unusual weakness or fatigue Infusion reactions--chest pain, shortness of breath or trouble breathing, feeling faint or lightheaded Side effects that usually do not require medical attention (report to your care team if they continue or are bothersome): Diarrhea Dry skin Fatigue Hair loss Nausea Vomiting This list may not describe all possible side effects. Call your doctor for medical advice about side effects. You may report side effects to FDA at 1-800-FDA-1088. Where should I keep my medication? This medication is given in a hospital or clinic. It will not be stored at home. NOTE:  This sheet is a summary. It may not cover all possible information. If you have questions about this medicine, talk to your doctor, pharmacist, or health care provider.  2024 Elsevier/Gold Standard (2021-06-03 00:00:00)

## 2022-12-23 NOTE — Assessment & Plan Note (Signed)
CT abdomen pelvis w contrast for further evaluation,.

## 2022-12-23 NOTE — Assessment & Plan Note (Addendum)
Chronic issue for her.  Xray was not done, per patient her pain doctor has done the xray.  Result was scanned in media.  Lumbar xray showed stable spondylosis and spondylolisthesis.  She has multiple bone pain, -  bone scan-negative for metastatic disease.  Patient has degenerative disease.

## 2022-12-23 NOTE — Assessment & Plan Note (Addendum)
Left breast cancer, cT4b N3 Mx, ER/PR-, HER2 + S/p neoadjuvant chemotherapy 6 cycles of TCHP  S/p left mastectomy + SLNB -ypT0 ypN0, complete pathological response.  Recommend adjuvant Transtuzumab and Pertuzumab 11 cycles.  S/p adjuvant radiation-   Labs reviewed and discussed with patient.   Proceed with trastuzumab and pertuzumab today.  Last treatment today.  Sept 2024 echocardiogram- stable LVEF-   Recommend adjuvant Zometa- she declined.  Left breast seroma s/p aspiration.

## 2022-12-30 ENCOUNTER — Ambulatory Visit: Payer: Medicare HMO | Attending: Oncology

## 2023-01-19 ENCOUNTER — Emergency Department: Payer: Medicare HMO

## 2023-01-19 ENCOUNTER — Encounter: Payer: Self-pay | Admitting: Oncology

## 2023-01-19 ENCOUNTER — Emergency Department
Admission: EM | Admit: 2023-01-19 | Discharge: 2023-01-19 | Discharge status: Against Medical Advice | Payer: Medicare HMO | Attending: Emergency Medicine | Admitting: Emergency Medicine

## 2023-01-19 ENCOUNTER — Other Ambulatory Visit: Payer: Self-pay

## 2023-01-19 DIAGNOSIS — G8929 Other chronic pain: Secondary | ICD-10-CM | POA: Insufficient documentation

## 2023-01-19 DIAGNOSIS — M545 Low back pain, unspecified: Secondary | ICD-10-CM | POA: Insufficient documentation

## 2023-01-19 DIAGNOSIS — R0981 Nasal congestion: Secondary | ICD-10-CM | POA: Diagnosis present

## 2023-01-19 DIAGNOSIS — Z5321 Procedure and treatment not carried out due to patient leaving prior to being seen by health care provider: Secondary | ICD-10-CM | POA: Diagnosis not present

## 2023-01-19 DIAGNOSIS — Z20822 Contact with and (suspected) exposure to covid-19: Secondary | ICD-10-CM | POA: Insufficient documentation

## 2023-01-19 LAB — RESP PANEL BY RT-PCR (RSV, FLU A&B, COVID)  RVPGX2
Influenza A by PCR: NEGATIVE
Influenza B by PCR: NEGATIVE
Resp Syncytial Virus by PCR: NEGATIVE
SARS Coronavirus 2 by RT PCR: NEGATIVE
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
## 2023-01-28 NOTE — Telephone Encounter (Signed)
Called [facility] to follow up on referral. They have received referral and on 12/15/22 they called her but were not able to reach her. Call made to pt to contact their office for pt. No answer, detailed VM left with [facility] office number.

## 2023-02-01 ENCOUNTER — Other Ambulatory Visit: Payer: Self-pay

## 2023-02-01 ENCOUNTER — Emergency Department
Admission: EM | Admit: 2023-02-01 | Discharge: 2023-02-01 | Disposition: A | Discharge status: Home / Self Care | Payer: Medicare HMO | Attending: Emergency Medicine | Admitting: Emergency Medicine

## 2023-02-01 ENCOUNTER — Emergency Department: Payer: Medicare HMO

## 2023-02-01 DIAGNOSIS — M1990 Unspecified osteoarthritis, unspecified site: Secondary | ICD-10-CM

## 2023-02-01 DIAGNOSIS — M25562 Pain in left knee: Secondary | ICD-10-CM | POA: Diagnosis present

## 2023-02-01 DIAGNOSIS — M1712 Unilateral primary osteoarthritis, left knee: Secondary | ICD-10-CM | POA: Diagnosis not present

## 2023-02-01 MED ORDER — OXYCODONE-ACETAMINOPHEN 5-325 MG PO TABS: 1.0000 | ORAL_TABLET | Freq: Three times a day (TID) | ORAL | 0 refills | Status: DC | PRN

## 2023-02-01 MED ORDER — OXYCODONE-ACETAMINOPHEN 5-325 MG PO TABS
1.0000 | ORAL_TABLET | Freq: Once | ORAL | Status: AC
  Administered 2023-02-01: 1 via ORAL
  Filled 2023-02-01: qty 1
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
## 2023-02-01 NOTE — ED Notes (Signed)
Pt c/o right knee pain. Pt states that she has not injured it. Pt reports hx/o arthritis

## 2023-02-03 ENCOUNTER — Encounter: Payer: Self-pay | Admitting: Oncology

## 2023-02-04 ENCOUNTER — Inpatient Hospital Stay: Payer: Medicare PPO

## 2023-02-05 ENCOUNTER — Encounter: Payer: Self-pay | Admitting: Oncology

## 2023-02-05 ENCOUNTER — Inpatient Hospital Stay: Payer: Medicare PPO | Attending: Oncology

## 2023-02-05 DIAGNOSIS — Z452 Encounter for adjustment and management of vascular access device: Secondary | ICD-10-CM | POA: Diagnosis not present

## 2023-02-05 DIAGNOSIS — C50912 Malignant neoplasm of unspecified site of left female breast: Secondary | ICD-10-CM | POA: Insufficient documentation

## 2023-02-05 DIAGNOSIS — Z95828 Presence of other vascular implants and grafts: Secondary | ICD-10-CM

## 2023-02-05 MED ORDER — SODIUM CHLORIDE 0.9% FLUSH
10.0000 mL | Freq: Once | INTRAVENOUS | Status: AC
Start: 2023-02-05 — End: 2023-02-05
  Administered 2023-02-05: 10 mL via INTRAVENOUS
  Filled 2023-02-05: qty 10

## 2023-02-05 MED ORDER — HEPARIN SOD (PORK) LOCK FLUSH 100 UNIT/ML IV SOLN
500.0000 [IU] | Freq: Once | INTRAVENOUS | Status: AC
Start: 2023-02-05 — End: 2023-02-05
  Administered 2023-02-05: 500 [IU] via INTRAVENOUS
  Filled 2023-02-05: qty 5

## 2023-02-11 ENCOUNTER — Encounter: Payer: Self-pay | Admitting: Oncology

## 2023-03-04 ENCOUNTER — Ambulatory Visit: Payer: Medicare PPO | Attending: Radiation Oncology | Admitting: Radiation Oncology

## 2023-03-15 ENCOUNTER — Encounter: Payer: Self-pay | Admitting: Oncology

## 2023-03-22 ENCOUNTER — Ambulatory Visit: Payer: Medicare PPO | Admitting: Radiation Oncology

## 2023-03-25 ENCOUNTER — Inpatient Hospital Stay: Payer: Medicare PPO

## 2023-03-25 ENCOUNTER — Other Ambulatory Visit: Payer: Self-pay | Admitting: Oncology

## 2023-03-25 ENCOUNTER — Inpatient Hospital Stay: Payer: Medicare PPO | Admitting: Oncology

## 2023-03-25 NOTE — Assessment & Plan Note (Deleted)
 Left breast cancer, cT4b N3 Mx, ER/PR-, HER2 + S/p neoadjuvant chemotherapy 6 cycles of TCHP  03/27/2022 S/p left partial mastectomy + SLNB -ypT0 ypN0, complete pathological response.  S/p  adjuvant Transtuzumab and Pertuzumab 11 cycles.  S/p adjuvant radiation-   she declined.adjuvant Zometa-   Left breast seroma s/p aspiration.

## 2023-04-20 ENCOUNTER — Ambulatory Visit
Admission: RE | Admit: 2023-04-20 | Discharge: 2023-04-20 | Disposition: A | Discharge status: Home / Self Care | Payer: Medicare PPO | Source: Ambulatory Visit | Attending: Radiation Oncology | Admitting: Radiation Oncology

## 2023-04-20 ENCOUNTER — Encounter: Payer: Self-pay | Admitting: Radiation Oncology

## 2023-04-20 VITALS — BP 120/68 | HR 66 | Temp 97.6°F | Resp 20 | Wt 239.0 lb

## 2023-04-20 DIAGNOSIS — Z923 Personal history of irradiation: Secondary | ICD-10-CM | POA: Diagnosis not present

## 2023-04-20 DIAGNOSIS — C50012 Malignant neoplasm of nipple and areola, left female breast: Secondary | ICD-10-CM | POA: Diagnosis present

## 2023-04-20 DIAGNOSIS — Z171 Estrogen receptor negative status [ER-]: Secondary | ICD-10-CM | POA: Insufficient documentation

## 2023-04-20 NOTE — Progress Notes (Signed)
 Radiation Oncology Follow up Note  Name: Vanessa Fisher   Date:   04/20/2023 MRN:  [id number] DOB: [birth year]    This 63 y.o. female presents to the clinic today for 10-month follow-up status post whole breast radiation to her left breast and peripheral lymphatics for stage IIIb (cT4b cN3c M0) ER/PR negative HER2/neu overexpressed invasive mammary carcinoma status post neoadjuvant chemotherapy with complete response.  REFERRING PROVIDER: No ref. provider found  HPI: Patient is a 63 year old female now out 7 months having completed whole breast radiation to her left breast and peripheral lymphatics for stage IIIb ER/PR negative HER2/neu overexpressed invasive mammary carcinoma.  Seen today in routine follow-up she is doing well from a breast standpoint she does not need knee replacement surgery which is scheduled in the future.  She specifically denies breast tenderness cough or bone pain..  She is not on endocrine therapy based on the ER/PR negative nature of her disease.  She is currently on trastuzumab and pertuzumab for HER2 nu positive disease.  She is tolerating it well.  She specifically denies breast tenderness cough or bone pain.  COMPLICATIONS OF TREATMENT: none  FOLLOW UP COMPLIANCE: keeps appointments   PHYSICAL EXAM:  BP 120/68   Pulse 66   Temp 97.6 F (36.4 C) (Tympanic)   Resp 20   Wt 239 lb (108.4 kg)   BMI 38.58 kg/m  Lungs are clear to A&P cardiac examination essentially unremarkable with regular rate and rhythm. No dominant mass or nodularity is noted in either breast in 2 positions examined. Incision is well-healed. No axillary or supraclavicular adenopathy is appreciated. Cosmetic result is excellent.  Well-developed well-nourished patient in NAD. HEENT reveals PERLA, EOMI, discs not visualized.  Oral cavity is clear. No oral mucosal lesions are identified. Neck is clear without evidence of cervical or supraclavicular adenopathy. Lungs are clear to A&P. Cardiac  examination is essentially unremarkable with regular rate and rhythm without murmur rub or thrill. Abdomen is benign with no organomegaly or masses noted. Motor sensory and DTR levels are equal and symmetric in the upper and lower extremities. Cranial nerves II through XII are grossly intact. Proprioception is intact. No peripheral adenopathy or edema is identified. No motor or sensory levels are noted. Crude visual fields are within normal range.  RADIOLOGY RESULTS: No current films  PLAN: Present time patient is doing well from a breast standpoint with no evidence of disease.  And pleased with her overall progress.  Assess her back in 6 months and we will start once year follow-up visits.  She continues care with through medical oncology.  She is also scheduling her knee replacement in near future.  Patient is to call with any concerns.  I would like to take this opportunity to thank you for allowing me to participate in the care of your patient.Carmina Miller, MD

## 2023-04-23 ENCOUNTER — Inpatient Hospital Stay: Payer: Medicare PPO | Admitting: Oncology

## 2023-04-23 ENCOUNTER — Inpatient Hospital Stay: Payer: Medicare PPO | Attending: Oncology

## 2023-04-23 ENCOUNTER — Inpatient Hospital Stay: Payer: Medicare PPO

## 2023-04-23 DIAGNOSIS — Z9012 Acquired absence of left breast and nipple: Secondary | ICD-10-CM | POA: Insufficient documentation

## 2023-04-23 DIAGNOSIS — Z171 Estrogen receptor negative status [ER-]: Secondary | ICD-10-CM | POA: Insufficient documentation

## 2023-04-23 DIAGNOSIS — Z79899 Other long term (current) drug therapy: Secondary | ICD-10-CM | POA: Insufficient documentation

## 2023-04-23 DIAGNOSIS — Z87891 Personal history of nicotine dependence: Secondary | ICD-10-CM | POA: Insufficient documentation

## 2023-04-23 DIAGNOSIS — Z923 Personal history of irradiation: Secondary | ICD-10-CM | POA: Insufficient documentation

## 2023-04-23 DIAGNOSIS — D649 Anemia, unspecified: Secondary | ICD-10-CM | POA: Insufficient documentation

## 2023-04-23 DIAGNOSIS — Z1731 Human epidermal growth factor receptor 2 positive status: Secondary | ICD-10-CM | POA: Insufficient documentation

## 2023-04-23 DIAGNOSIS — Z9221 Personal history of antineoplastic chemotherapy: Secondary | ICD-10-CM | POA: Insufficient documentation

## 2023-04-23 DIAGNOSIS — Z1722 Progesterone receptor negative status: Secondary | ICD-10-CM | POA: Insufficient documentation

## 2023-04-23 DIAGNOSIS — C50912 Malignant neoplasm of unspecified site of left female breast: Secondary | ICD-10-CM | POA: Insufficient documentation

## 2023-04-28 ENCOUNTER — Inpatient Hospital Stay

## 2023-04-28 ENCOUNTER — Encounter: Payer: Self-pay | Admitting: Oncology

## 2023-04-28 ENCOUNTER — Inpatient Hospital Stay (HOSPITAL_BASED_OUTPATIENT_CLINIC_OR_DEPARTMENT_OTHER): Admitting: Oncology

## 2023-04-28 VITALS — BP 125/56 | HR 65 | Temp 96.2°F | Resp 18 | Wt 240.2 lb

## 2023-04-28 DIAGNOSIS — Z9012 Acquired absence of left breast and nipple: Secondary | ICD-10-CM | POA: Diagnosis not present

## 2023-04-28 DIAGNOSIS — Z87891 Personal history of nicotine dependence: Secondary | ICD-10-CM | POA: Diagnosis not present

## 2023-04-28 DIAGNOSIS — M545 Low back pain, unspecified: Secondary | ICD-10-CM

## 2023-04-28 DIAGNOSIS — D649 Anemia, unspecified: Secondary | ICD-10-CM | POA: Diagnosis not present

## 2023-04-28 DIAGNOSIS — Z1731 Human epidermal growth factor receptor 2 positive status: Secondary | ICD-10-CM | POA: Diagnosis not present

## 2023-04-28 DIAGNOSIS — Z171 Estrogen receptor negative status [ER-]: Secondary | ICD-10-CM | POA: Diagnosis not present

## 2023-04-28 DIAGNOSIS — Z79899 Other long term (current) drug therapy: Secondary | ICD-10-CM | POA: Diagnosis not present

## 2023-04-28 DIAGNOSIS — C50912 Malignant neoplasm of unspecified site of left female breast: Secondary | ICD-10-CM | POA: Diagnosis present

## 2023-04-28 DIAGNOSIS — C50911 Malignant neoplasm of unspecified site of right female breast: Secondary | ICD-10-CM

## 2023-04-28 DIAGNOSIS — Z95828 Presence of other vascular implants and grafts: Secondary | ICD-10-CM | POA: Insufficient documentation

## 2023-04-28 DIAGNOSIS — G8929 Other chronic pain: Secondary | ICD-10-CM

## 2023-04-28 DIAGNOSIS — Z1722 Progesterone receptor negative status: Secondary | ICD-10-CM | POA: Diagnosis not present

## 2023-04-28 DIAGNOSIS — E876 Hypokalemia: Secondary | ICD-10-CM | POA: Diagnosis not present

## 2023-04-28 DIAGNOSIS — R059 Cough, unspecified: Secondary | ICD-10-CM | POA: Insufficient documentation

## 2023-04-28 DIAGNOSIS — M1710 Unilateral primary osteoarthritis, unspecified knee: Secondary | ICD-10-CM | POA: Insufficient documentation

## 2023-04-28 DIAGNOSIS — Z9221 Personal history of antineoplastic chemotherapy: Secondary | ICD-10-CM | POA: Diagnosis not present

## 2023-04-28 DIAGNOSIS — Z923 Personal history of irradiation: Secondary | ICD-10-CM | POA: Diagnosis not present

## 2023-04-28 LAB — CBC WITH DIFFERENTIAL (CANCER CENTER ONLY)
Abs Immature Granulocytes: 0.02 10*3/uL (ref 0.00–0.07)
Basophils Absolute: 0 10*3/uL (ref 0.0–0.1)
Basophils Relative: 1 %
Eosinophils Absolute: 0.1 10*3/uL (ref 0.0–0.5)
Eosinophils Relative: 2 %
HCT: 32.6 % — ABNORMAL LOW (ref 36.0–46.0)
Hemoglobin: 10.1 g/dL — ABNORMAL LOW (ref 12.0–15.0)
Immature Granulocytes: 0 %
Lymphocytes Relative: 26 %
Lymphs Abs: 1.2 10*3/uL (ref 0.7–4.0)
MCH: 25.5 pg — ABNORMAL LOW (ref 26.0–34.0)
MCHC: 31 g/dL (ref 30.0–36.0)
MCV: 82.3 fL (ref 80.0–100.0)
Monocytes Absolute: 0.4 10*3/uL (ref 0.1–1.0)
Monocytes Relative: 9 %
Neutro Abs: 3 10*3/uL (ref 1.7–7.7)
Neutrophils Relative %: 62 %
Platelet Count: 266 10*3/uL (ref 150–400)
RBC: 3.96 MIL/uL (ref 3.87–5.11)
RDW: 15.2 % (ref 11.5–15.5)
WBC Count: 4.8 10*3/uL (ref 4.0–10.5)
nRBC: 0 % (ref 0.0–0.2)

## 2023-04-28 LAB — CMP (CANCER CENTER ONLY)
ALT: 14 U/L (ref 0–44)
AST: 20 U/L (ref 15–41)
Albumin: 3.4 g/dL — ABNORMAL LOW (ref 3.5–5.0)
Alkaline Phosphatase: 101 U/L (ref 38–126)
Anion gap: 8 (ref 5–15)
BUN: 14 mg/dL (ref 8–23)
CO2: 25 mmol/L (ref 22–32)
Calcium: 8.9 mg/dL (ref 8.9–10.3)
Chloride: 104 mmol/L (ref 98–111)
Creatinine: 0.85 mg/dL (ref 0.44–1.00)
GFR, Estimated: >60 mL/min (ref >60)
Glucose, Bld: 110 mg/dL — ABNORMAL HIGH (ref 70–99)
Potassium: 3.6 mmol/L (ref 3.5–5.1)
Sodium: 137 mmol/L (ref 135–145)
Total Bilirubin: 0.4 mg/dL (ref 0.0–1.2)
Total Protein: 6.6 g/dL (ref 6.5–8.1)

## 2023-04-28 LAB — MAGNESIUM: Magnesium: 1.6 mg/dL — ABNORMAL LOW (ref 1.7–2.4)

## 2023-04-28 MED ORDER — MAGNESIUM SULFATE 2 GM/50ML IV SOLN
2.0000 g | Freq: Once | INTRAVENOUS | Status: AC
  Administered 2023-04-28: 2 g via INTRAVENOUS
  Filled 2023-04-28: qty 50

## 2023-04-28 MED ORDER — SODIUM CHLORIDE 0.9% FLUSH
10.0000 mL | Freq: Once | INTRAVENOUS | Status: AC
  Administered 2023-04-28: 10 mL via INTRAVENOUS
  Filled 2023-04-28: qty 10

## 2023-04-28 MED ORDER — HEPARIN SOD (PORK) LOCK FLUSH 100 UNIT/ML IV SOLN
500.0000 [IU] | Freq: Once | INTRAVENOUS | Status: AC
  Administered 2023-04-28: 500 [IU] via INTRAVENOUS
  Filled 2023-04-28: qty 5

## 2023-04-28 MED ORDER — SLOW-MAG 71.5-119 MG PO TBEC: 1.0000 | DELAYED_RELEASE_TABLET | Freq: Two times a day (BID) | ORAL | 2 refills | Status: AC

## 2023-04-28 MED ORDER — SODIUM CHLORIDE 0.9 % IV SOLN
INTRAVENOUS | Status: DC
  Filled 2023-04-28: qty 250

## 2023-04-28 NOTE — Patient Instructions (Signed)
 Magnesium Sulfate Injection What is this medication? MAGNESIUM SULFATE (mag NEE zee um SUL fate) prevents and treats low levels of magnesium in your body. It may also be used to prevent and treat seizures during pregnancy in people with high blood pressure disorders, such as preeclampsia or eclampsia. Magnesium plays an important role in maintaining the health of your muscles and nervous system. This medicine may be used for other purposes; ask your health care provider or pharmacist if you have questions. What should I tell my care team before I take this medication? They need to know if you have any of these conditions: Heart disease History of irregular heart beat Kidney disease An unusual or allergic reaction to magnesium sulfate, medications, foods, dyes, or preservatives Pregnant or trying to get pregnant Breast-feeding How should I use this medication? This medication is for infusion into a vein. It is given in a hospital or clinic setting. Talk to your care team about the use of this medication in children. While this medication may be prescribed for selected conditions, precautions do apply. Overdosage: If you think you have taken too much of this medicine contact a poison control center or emergency room at once. NOTE: This medicine is only for you. Do not share this medicine with others. What if I miss a dose? This does not apply. What may interact with this medication? Certain medications for anxiety or sleep Certain medications for seizures, such phenobarbital Digoxin Medications that relax muscles for surgery Narcotic medications for pain This list may not describe all possible interactions. Give your health care provider a list of all the medicines, herbs, non-prescription drugs, or dietary supplements you use. Also tell them if you smoke, drink alcohol, or use illegal drugs. Some items may interact with your medicine. What should I watch for while using this  medication? Your condition will be monitored carefully while you are receiving this medication. You may need blood work done while you are receiving this medication. What side effects may I notice from receiving this medication? Side effects that you should report to your care team as soon as possible: Allergic reactions--skin rash, itching, hives, swelling of the face, lips, tongue, or throat High magnesium level--confusion, drowsiness, facial flushing, redness, sweating, muscle weakness, fast or irregular heartbeat, trouble breathing Low blood pressure--dizziness, feeling faint or lightheaded, blurry vision Side effects that usually do not require medical attention (report to your care team if they continue or are bothersome): Headache Nausea This list may not describe all possible side effects. Call your doctor for medical advice about side effects. You may report side effects to FDA at 1-800-FDA-1088. Where should I keep my medication? This medication is given in a hospital or clinic and will not be stored at home. NOTE: This sheet is a summary. It may not cover all possible information. If you have questions about this medicine, talk to your doctor, pharmacist, or health care provider.  2024 Elsevier/Gold Standard (2020-10-02 00:00:00)

## 2023-04-28 NOTE — Assessment & Plan Note (Addendum)
 Left breast cancer, cT4b N3 Mx, ER/PR-, HER2 + S/p neoadjuvant chemotherapy 6 cycles of TCHP  S/p left lumpectomy  + SLNB -ypT0 ypN0, complete pathological response.  S/p adjuvant Transtuzumab and Pertuzumab 11 cycles.  S/p adjuvant radiation-   Sept 2024 echocardiogram- stable LVEF-  Recommend adjuvant Zometa- she declined.  Left breast seroma s/p aspiration.

## 2023-04-28 NOTE — Progress Notes (Signed)
 Hematology/Oncology Progress note [phone] [phone]     CHIEF COMPLAINTS/REASON FOR VISIT:  Follow-up for Stage IIIB left breast cancer, ER-, HER2+  ASSESSMENT & PLAN:   Cancer Staging  Breast cancer [facility]) Staging form: Breast, AJCC 8th Edition - Clinical stage from 08/21/2021: Stage IIIB (cT4b, cN3c, cM0, G3, ER-, PR-, HER2+) - Signed by [name], MD on 09/12/2021  Breast cancer (HCC) Left breast cancer, cT4b N3 Mx, ER/PR-, HER2 + S/p neoadjuvant chemotherapy 6 cycles of TCHP  S/p left lumpectomy  + SLNB -ypT0 ypN0, complete pathological response.  S/p adjuvant Transtuzumab and Pertuzumab 11 cycles.  S/p adjuvant radiation-   Sept 2024 echocardiogram- stable LVEF-  Recommend adjuvant Zometa- she declined.  Left breast seroma s/p aspiration.   Back pain Chronic issue for her.  Previous lumbar xray showed stable spondylosis and spondylolisthesis.  bone scan-negative for metastatic disease.      Hypomagnesemia Continue Slow Mag 1 tab BID IV Mag 2g x 1   Normocytic anemia Hemoglobin is stable. Monitor counts.   Hypocalcemia Recommend patient to take calcium 1200mg  daily, otc supply.  Take vitamin D supplementation.   Port-A-Cath in place Continue port flush Q6-8 weeks.   Arthritis of knee Follow up with orthopedic surgeon. She will try conservative measures first.  If she has to get knew replacement, consider removal of medi port.   Cough Obtain CXR    Orders Placed This Encounter  Procedures   DG Chest 2 View    Standing Status:   Future    Expected Date:   04/28/2023    Expiration Date:   04/27/2024    Reason for Exam (SYMPTOM  OR DIAGNOSIS REQUIRED):   cough    Preferred imaging location?:   [facility]   CBC with Differential (Cancer Center Only)    Standing Status:   Future    Expected Date:   07/29/2023    Expiration Date:   04/27/2024   CMP (Cancer Center only)    Standing Status:   Future    Expected Date:    07/29/2023    Expiration Date:   04/27/2024   Magnesium    Standing Status:   Future    Expected Date:   07/29/2023    Expiration Date:   04/27/2024   Follow up in 3 weeks.  All questions were answered. The patient knows to call the clinic with any problems questions or concerns.  Return of visit:  3 weeks lab MD  +/- IV magnesium  [name], MD, PhD [facility] Hematology Oncology 04/28/2023      HISTORY OF PRESENTING ILLNESS:  Vanessa Fisher is a  63 y.o.  female with PMH listed below who was referred to me for evaluation of  left breast cancer SUMMARY OF ONCOLOGIC HISTORY: Oncology History  Breast cancer (HCC)  07/31/2021 Imaging   Bilateral diagnostic mammogram and US showed 5 centimeter LEFT breast mass associated with pleomorphic calcifications is suspicious for invasive ductal carcinoma.At least 4 LEFT axillary lymph nodes with abnormal morphology   08/21/2021 Cancer Staging   Staging form: Breast, AJCC 8th Edition - Clinical: Stage IIIB (cT4b, cN3c, cM0, G3, ER-, PR-, HER2+) - Signed by [name], MD on 08/28/2021 Histologic grading system: 3 grade system  -08/21/21 left breast ultrasound-guided biopsy showed invasive mammary carcinoma, grade 3, ER/PR negative, HER2 positive.  Left axillary lymph node biopsy positive for macro metastatic mammary carcinoma, 8 mm in greatest extent.   08/30/2021 Imaging   MRI brain w  wo contrast -No metastatic disease or acute intracranial abnormality. Essentially normal for age MRI appearance of the brain.    09/03/2021 Echocardiogram   1. Left ventricular ejection fraction, by estimation, is 55 to 60%. Left ventricular ejection fraction by 3D volume is 58 %. The left ventricle has normal function. The left ventricle has no regional wall motion abnormalities. There is mild left  ventricular hypertrophy. Left ventricular diastolic parameters were normal.  2. Right ventricular systolic function is normal. The right ventricular size is  normal.  3. The mitral valve is normal in structure. No evidence of mitral valve regurgitation.  4. The aortic valve was not well visualized. Aortic valve regurgitation is not visualized   09/10/2021 Imaging   PET scan showed Large hypermetabolic left breast mass and diffuse skin thickening,consistent with primary breast carcinoma. Hypermetabolic lymphadenopathy in the left axilla, left subpectoral region, and left supraclavicular region, consistent with metastatic disease. No evidence of metastatic disease within the abdomen or pelvis   09/12/2021 - 02/13/2022 Chemotherapy   Patient is on Treatment Plan : BREAST  Docetaxel + Carboplatin + Trastuzumab + Pertuzumab  (TCHP) q21d       Genetic Testing   Negative genetic testing. No pathogenic variants identified on the [facility] Common Hereditary Cancers+RNA panel. The report date is 10/26/2021.  The Common Hereditary Cancers Panel + RNA offered by [facility] includes sequencing and/or deletion duplication testing of the following 47 genes: APC, ATM, AXIN2, BARD1, BMPR1A, BRCA1, BRCA2, BRIP1, CDH1, CDKN2A (p14ARF), CDKN2A (p16INK4a), CKD4, CHEK2, CTNNA1, DICER1, EPCAM (Deletion/duplication testing only), GREM1 (promoter region deletion/duplication testing only), KIT, MEN1, MLH1, MSH2, MSH3, MSH6, MUTYH, NBN, NF1, NHTL1, PALB2, PDGFRA, PMS2, POLD1, POLE, PTEN, RAD50, RAD51C, RAD51D, SDHB, SDHC, SDHD, SMAD4, SMARCA4. STK11, TP53, TSC1, TSC2, and VHL.  The following genes were evaluated for sequence changes only: SDHA and HOXB13 c.251G>A variant only.   02/09/2022 Echocardiogram   LVEF 55 to 60%    03/27/2022 Surgery   S/p left mastectomy + SLNB ypT0 ypN0,    04/24/2022 -  Chemotherapy   Patient is on Treatment Plan : BREAST Trastuzumab  + Pertuzumab q21d x 13 cycles     05/06/2022 Echocardiogram   LVEF 55 to 60%    07/02/2022 - 08/03/2022 Radiation Therapy   Adjuvant breast radiation.    10/20/2022 Imaging   Bone scan whole body showed 1. Degenerative  uptake at the sternoclavicular joints, shoulders,knees and hindfoot bilaterally. 2. No evidence for metastatic disease to the. 3. Uptake in the left breast may be related to postsurgical change or residual tumor.    + chronic nasal pressure, congestion, horse voice she attributes to sinusitis.   INTERVAL HISTORY Vanessa Fisher is a 63 y.o. female who has above history reviewed by me today presents for follow up visit for ER negative HER2 positive breast cancer  No fever or chills.  + lower extremity cramps.  + knee arthritis, going to get injections in her knee.  + URI in Feb,continues to have cough, non productive.   MEDICAL HISTORY:  Past Medical History:  Diagnosis Date   Anemia    Anxiety    Arthritis    Asthma    WELL CONTROLLED   Bile acid malabsorption syndrome    Bradycardia    HAD AN ISSUE WITH THIS IN 2016-NO PROBLEMS SINCE   Breast cancer, left breast (HCC) 08/2021   Carpal tunnel syndrome on right    Depression    Diabetes mellitus without complication (HCC)    Diverticulitis  Gastric reflux    GERD (gastroesophageal reflux disease)    Hand pain 05/12/2016   Headache    H/O MIGRAINES   History of kidney stones    H/O   Lumbar radiculitis    Neck pain, chronic    Osteoarthritis of both knees    Panic attacks    Personal history of chemotherapy    Personal history of radiation therapy     SURGICAL HISTORY: Past Surgical History:  Procedure Laterality Date   BREAST BIOPSY Left 08/21/2021   Axilla Bx, Hydromarker, neg   BREAST BIOPSY Left 08/21/2021   [place] Bx, Ribbon Clip, invasive mammary carcinoma   BREAST BIOPSY Left 03/12/2022   [place] LT RADIO FREQUENCY TAG LOC US GUIDE 03/12/2022 [facility]   BREAST BIOPSY Left 03/12/2022   [place] LT RADIO FREQUENCY TAG EA ADD LESION LOC US GUIDE 03/12/2022 [facility]   BREAST LUMPECTOMY Left 03/27/2022   CARPAL TUNNEL RELEASE Right 12/06/2017   Procedure: CARPAL TUNNEL RELEASE;  Surgeon: Deeann Saint,  MD;  Location: [facility] ORS;  Service: Orthopedics;  Laterality: Right;   COLONOSCOPY WITH PROPOFOL N/A 06/11/2021   Procedure: COLONOSCOPY WITH PROPOFOL;  Surgeon: Toney Reil, MD;  Location: [facility] ENDOSCOPY;  Service: Gastroenterology;  Laterality: N/A;   DORSAL COMPARTMENT RELEASE Right 12/06/2017   Procedure: RELEASE DORSAL COMPARTMENT (DEQUERVAIN);  Surgeon: Deeann Saint, MD;  Location: [facility] ORS;  Service: Orthopedics;  Laterality: Right;   ESOPHAGOGASTRODUODENOSCOPY (EGD) WITH PROPOFOL N/A 06/11/2021   Procedure: ESOPHAGOGASTRODUODENOSCOPY (EGD) WITH PROPOFOL;  Surgeon: Toney Reil, MD;  Location: [facility] ENDOSCOPY;  Service: Gastroenterology;  Laterality: N/A;   PART MASTECTOMY,RADIO FREQUENCY LOCALIZER,AXILLARY SENTINEL NODE BIOPSY Left 03/27/2022   Procedure: PART MASTECTOMY,RADIO FREQUENCY LOCALIZER,AXILLARY SENTINEL NODE BIOPSY;  Surgeon: Carolan Shiver, MD;  Location: [facility] ORS;  Service: General;  Laterality: Left;   PARTIAL HYSTERECTOMY     PORTACATH PLACEMENT N/A 09/24/2021   Procedure: INSERTION PORT-A-CATH;  Surgeon: Carolan Shiver, MD;  Location: [facility] ORS;  Service: General;  Laterality: N/A;   TUBAL LIGATION      SOCIAL HISTORY: Social History   Socioeconomic History   Marital status: Single    Spouse name: Not on file   Number of children: Not on file   Years of education: Not on file   Highest education level: Not on file  Occupational History   Not on file  Tobacco Use   Smoking status: Former    Types: Cigarettes   Smokeless tobacco: Never  Vaping Use   Vaping status: Never Used  Substance and Sexual Activity   Alcohol use: No   Drug use: No   Sexual activity: Not on file  Other Topics Concern   Not on file  Social History Narrative   Lives alone   Social Drivers of Health   Financial Resource Strain: Low Risk  (03/29/2023)   [facility]   Overall Financial Resource Strain (CARDIA)    Difficulty  of Paying Living Expenses: Not hard at all  Food Insecurity: Food Insecurity Present (03/29/2023)   Received from [facility]   Hunger Vital Sign    Worried About Running Out of Food in the Last Year: Sometimes true    Ran Out of Food in the Last Year: Sometimes true  Transportation Needs: No Transportation Needs (03/29/2023)   Received from [facility] - Transportation    In the past 12 months, has lack of transportation kept you from medical appointments or from getting  medications?: No    Lack of Transportation (Non-Medical): No  Physical Activity: Inactive (08/15/2021)   Exercise Vital Sign    Days of Exercise per Week: 0 days    Minutes of Exercise per Session: 0 min  Stress: Stress Concern Present (08/15/2021)   [employer] of Occupational Health - Occupational Stress Questionnaire    Feeling of Stress : Rather much  Social Connections: Socially Isolated (08/15/2021)   Social Connection and Isolation Panel [NHANES]    Frequency of Communication with Friends and Family: Once a week    Frequency of Social Gatherings with Friends and Family: Once a week    Attends Religious Services: Never    Database administrator or Organizations: No    Attends Engineer, structural: Not on file    Marital Status: Never married  Intimate Partner Violence: Not At Risk (08/15/2021)   Humiliation, Afraid, Rape, and Kick questionnaire    Fear of Current or Ex-Partner: No    Emotionally Abused: No    Physically Abused: No    Sexually Abused: No    FAMILY HISTORY: Family History  Problem Relation Age of Onset   Diabetes Mother    Hypertension Mother    Heart failure Mother    Pancreatic cancer Mother 46   Diabetes Father    Hypertension Father    Breast cancer Maternal Aunt    Cervical cancer Maternal Aunt    Lung cancer Son 44    ALLERGIES:  is allergic to bc powder [aspirin-salicylamide-caffeine], doxycycline, gabapentin,  hydrocodone-acetaminophen, latex, penicillin g, penicillins, and shellfish allergy.  MEDICATIONS:  Current Outpatient Medications  Medication Sig Dispense Refill   albuterol (PROVENTIL) (2.5 MG/3ML) 0.083% nebulizer solution Take 3 mLs (2.5 mg total) by nebulization every 4 (four) hours as needed for wheezing or shortness of breath. 75 mL 2   albuterol (VENTOLIN HFA) 108 (90 Base) MCG/ACT inhaler Inhale 2 puffs into the lungs every 6 (six) hours as needed for wheezing or shortness of breath. 18 g 0   atorvastatin (LIPITOR) 10 MG tablet Take 10 mg by mouth daily.     Azelastine HCl 137 MCG/SPRAY SOLN Place 1 spray into the nose daily. 30 mL 0   calcium carbonate (OS-CAL - DOSED IN MG OF ELEMENTAL CALCIUM) 1250 (500 Ca) MG tablet Take 1 tablet (1,250 mg total) by mouth daily. 90 tablet 1   cetirizine (ZYRTEC ALLERGY) 10 MG tablet Take 1 tablet (10 mg total) by mouth daily. 90 tablet 0   cyclobenzaprine (FLEXERIL) 10 MG tablet Take 10 mg by mouth 3 (three) times daily.     diclofenac Sodium (VOLTAREN) 1 % GEL Apply topically.     escitalopram (LEXAPRO) 10 MG tablet Take 1 tablet (10 mg total) by mouth daily. 30 tablet 1   esomeprazole (NEXIUM) 40 MG capsule Take 1 capsule (40 mg total) by mouth daily at 12 noon. 30 capsule 3   etodolac (LODINE) 500 MG tablet Take 1 tablet by mouth 2 (two) times daily.     FLOVENT DISKUS 250 MCG/ACT AEPB Inhale 1 puff into the lungs daily.     fluticasone (FLONASE) 50 MCG/ACT nasal spray Place 2 sprays into both nostrils daily. 15.8 mL 0   Iron-Vitamin C 65-125 MG TABS Take 1 tablet by mouth daily at 2 PM. 30 tablet 2   KLOR-CON M20 20 MEQ tablet TAKE 1 TABLET BY MOUTH TWICE A DAY 60 tablet 1   lidocaine-prilocaine (EMLA) cream Apply 1 Application topically as needed. 30 g  12   montelukast (SINGULAIR) 10 MG tablet Take by mouth.     oxyCODONE-acetaminophen (PERCOCET) 5-325 MG tablet Take 1 tablet by mouth every 8 (eight) hours as needed for severe pain (pain score  7-10). 15 tablet 0   zinc gluconate 50 MG tablet Take 50 mg by mouth as needed.     diphenoxylate-atropine (LOMOTIL) 2.5-0.025 MG tablet Take 1 tablet by mouth 4 (four) times daily as needed for diarrhea or loose stools. (Patient not taking: Reported on 10/14/2022) 120 tablet 0   magnesium chloride (SLOW-MAG) 64 MG TBEC SR tablet Take 1 tablet (64 mg total) by mouth 2 (two) times daily. 60 tablet 2   naloxone (NARCAN) nasal spray 4 mg/0.1 mL  (Patient not taking: Reported on 10/14/2022)     No current facility-administered medications for this visit.   Facility-Administered Medications Ordered in Other Visits  Medication Dose Route Frequency Provider Last Rate Last Admin   0.9 %  sodium chloride infusion   Intravenous Continuous [name], MD 40 mL/hr at 04/28/23 0943 New Bag at 04/28/23 0943   magnesium sulfate IVPB 2 g 50 mL  2 g Intravenous Once Covington, Sarah M, PA-C        Review of Systems  Constitutional:  Negative for appetite change, chills, fatigue and fever.  HENT:   Negative for hearing loss and voice change.   Eyes:  Negative for eye problems.  Respiratory:  Positive for cough. Negative for chest tightness.   Cardiovascular:  Negative for chest pain.  Gastrointestinal:  Negative for abdominal distention, abdominal pain, blood in stool and diarrhea.  Endocrine: Negative for hot flashes.  Genitourinary:  Negative for difficulty urinating and frequency.   Musculoskeletal:  Positive for arthralgias and back pain.       Leg cramps  Skin:  Negative for itching and rash.  Neurological:  Negative for extremity weakness and headaches.  Hematological:  Negative for adenopathy.  Psychiatric/Behavioral:  Negative for confusion.      PHYSICAL EXAMINATION: ECOG PERFORMANCE STATUS: 1 - Symptomatic but completely ambulatory Vitals:   04/28/23 0907  BP: (!) 125/56  Pulse: 65  Resp: 18  Temp: (!) 96.2 F (35.7 C)  SpO2: 100%   Filed Weights   04/28/23 0907  Weight: 240 lb 3.2  oz (109 kg)    Physical Exam Constitutional:      General: She is not in acute distress.    Appearance: She is not diaphoretic.  HENT:     Head: Normocephalic and atraumatic.  Eyes:     General: No scleral icterus. Cardiovascular:     Rate and Rhythm: Normal rate and regular rhythm.     Heart sounds: No murmur heard. Pulmonary:     Effort: Pulmonary effort is normal. No respiratory distress.     Breath sounds: No wheezing.  Abdominal:     General: There is no distension.     Palpations: Abdomen is soft.     Tenderness: There is no abdominal tenderness.  Musculoskeletal:        General: Normal range of motion.     Cervical back: Normal range of motion and neck supple.  Skin:    General: Skin is warm and dry.     Findings: No erythema.  Neurological:     Mental Status: She is alert and oriented to person, place, and time.     Cranial Nerves: No cranial nerve deficit.     Motor: No abnormal muscle tone.     Coordination: Coordination  normal.  Psychiatric:        Mood and Affect: Affect normal.     Comments: Anxious         LABORATORY DATA:  I have reviewed the data as listed    Latest Ref Rng & Units 04/28/2023    8:54 AM 12/23/2022    8:53 AM 12/02/2022    8:40 AM  CBC  WBC 4.0 - 10.5 K/uL 4.8  6.6  5.0   Hemoglobin 12.0 - 15.0 g/dL 16.1  9.8  9.9   Hematocrit 36.0 - 46.0 % 32.6  31.8  31.5   Platelets 150 - 400 K/uL 266  231  244       Latest Ref Rng & Units 04/28/2023    8:54 AM 12/23/2022    8:53 AM 12/02/2022    8:40 AM  CMP  Glucose 70 - 99 mg/dL 096  045  409   BUN 8 - 23 mg/dL 14  21  16    Creatinine 0.44 - 1.00 mg/dL 8.11  9.14  7.82   Sodium 135 - 145 mmol/L 137  138  137   Potassium 3.5 - 5.1 mmol/L 3.6  3.9  4.2   Chloride 98 - 111 mmol/L 104  106  105   CO2 22 - 32 mmol/L 25  24  27    Calcium 8.9 - 10.3 mg/dL 8.9  8.8  9.0   Total Protein 6.5 - 8.1 g/dL 6.6  6.8  7.0   Total Bilirubin 0.0 - 1.2 mg/dL 0.4  0.3  0.2   Alkaline Phos 38 - 126  U/L 101  104  98   AST 15 - 41 U/L 20  16  20    ALT 0 - 44 U/L 14  20  16        RADIOGRAPHIC STUDIES: I have personally reviewed the radiological images as listed and agreed with the findings in the report. No results found.

## 2023-04-28 NOTE — Assessment & Plan Note (Signed)
 Obtain CXR

## 2023-04-28 NOTE — Assessment & Plan Note (Signed)
 Chronic issue for her.  Previous lumbar xray showed stable spondylosis and spondylolisthesis.  bone scan-negative for metastatic disease.

## 2023-04-28 NOTE — Assessment & Plan Note (Signed)
 Continue Slow Mag 1 tab BID IV Mag 2g x 1

## 2023-04-28 NOTE — Assessment & Plan Note (Signed)
 Follow up with orthopedic surgeon. She will try conservative measures first.  If she has to get knew replacement, consider removal of medi port.

## 2023-04-28 NOTE — Assessment & Plan Note (Signed)
 Recommend patient to take calcium 1200mg  daily, otc supply.  Take vitamin D supplementation.

## 2023-04-28 NOTE — Assessment & Plan Note (Signed)
Hemoglobin is stable. Monitor counts.

## 2023-04-28 NOTE — Assessment & Plan Note (Signed)
Continue port flush Q6-8 weeks.

## 2023-04-29 ENCOUNTER — Encounter: Payer: Self-pay | Admitting: *Deleted

## 2023-04-29 LAB — CANCER ANTIGEN 15-3: CA 15-3: 16.4 U/mL (ref 0.0–25.0)

## 2023-04-29 LAB — CANCER ANTIGEN 27.29: CA 27.29: 18.1 U/mL (ref 0.0–38.6)

## 2023-06-02 ENCOUNTER — Inpatient Hospital Stay: Attending: Oncology | Admitting: Licensed Clinical Social Worker

## 2023-06-02 ENCOUNTER — Inpatient Hospital Stay: Admitting: Licensed Clinical Social Worker

## 2023-06-02 NOTE — Progress Notes (Signed)
 [facility] CSW Progress Note  Clinical Child psychotherapist called patient per the request of patient and Charity fundraiser.  Patient expressed financial strain.  Per Fausto Hooker Maxey's note from 06/30/22, patient has exhausted all financial assistance from the cancer center.  She expressed wanting assistance with breast cancer grants.  Meeting with CSW is scheduled for Monday, May 5th at 11:30, at the [facility].   Kennth Peal, LCSW Clinical Social Worker [facility]    Patient is participating in a Managed Medicaid Plan:  Yes

## 2023-06-07 ENCOUNTER — Inpatient Hospital Stay: Attending: Oncology

## 2023-06-07 DIAGNOSIS — C50912 Malignant neoplasm of unspecified site of left female breast: Secondary | ICD-10-CM | POA: Insufficient documentation

## 2023-06-07 DIAGNOSIS — Z452 Encounter for adjustment and management of vascular access device: Secondary | ICD-10-CM | POA: Insufficient documentation

## 2023-06-07 NOTE — Progress Notes (Signed)
 [facility] CSW Progress Note  Visual merchandiser met with patient to discuss breast cancer grants.  Reviewed the grants and entered some of the information.  Patient to take home and complete.  CSW will meet with Vanessa Fisher again if she has additional questions.  Offered active listening and supportive counseling.  She is in the process of applying for social security disability.  She has also contacted several non profits in the area and asked for assistance.Vanessa Peal, LCSW Clinical Social Worker [facility]    Patient is participating in a Managed Medicaid Plan:  Yes

## 2023-06-09 ENCOUNTER — Inpatient Hospital Stay

## 2023-06-09 DIAGNOSIS — Z452 Encounter for adjustment and management of vascular access device: Secondary | ICD-10-CM | POA: Diagnosis not present

## 2023-06-09 DIAGNOSIS — Z95828 Presence of other vascular implants and grafts: Secondary | ICD-10-CM

## 2023-06-09 DIAGNOSIS — C50912 Malignant neoplasm of unspecified site of left female breast: Secondary | ICD-10-CM | POA: Diagnosis present

## 2023-06-09 MED ORDER — HEPARIN SOD (PORK) LOCK FLUSH 100 UNIT/ML IV SOLN
500.0000 [IU] | Freq: Once | INTRAVENOUS | Status: AC
  Administered 2023-06-09: 500 [IU] via INTRAVENOUS
  Filled 2023-06-09: qty 5

## 2023-06-09 MED ORDER — SODIUM CHLORIDE 0.9% FLUSH
10.0000 mL | Freq: Once | INTRAVENOUS | Status: AC
  Administered 2023-06-09: 10 mL via INTRAVENOUS
  Filled 2023-06-09: qty 10

## 2023-06-24 ENCOUNTER — Other Ambulatory Visit: Payer: Self-pay | Admitting: Oncology

## 2023-06-24 DIAGNOSIS — E876 Hypokalemia: Secondary | ICD-10-CM

## 2023-06-29 ENCOUNTER — Encounter: Payer: Self-pay | Admitting: Oncology

## 2023-06-30 ENCOUNTER — Telehealth: Payer: Self-pay | Admitting: *Deleted

## 2023-06-30 NOTE — Telephone Encounter (Signed)
 Patient left message on triage line requesting a appointment with Dr Wilhelmenia Harada.  States she has had pain in her breast for a week, taking tylenol  and ibuprofen  but is concerned.  RN called patient but was unable to reach her for more information.

## 2023-06-30 NOTE — Telephone Encounter (Signed)
 She called rad onc regarding same issue. Vanessa Fisher has talked to pt and move up appts. Thank you

## 2023-07-06 ENCOUNTER — Encounter: Payer: Self-pay | Admitting: Pediatrics

## 2023-07-20 ENCOUNTER — Other Ambulatory Visit: Payer: Self-pay

## 2023-07-20 ENCOUNTER — Encounter: Payer: Self-pay | Admitting: Oncology

## 2023-07-20 ENCOUNTER — Inpatient Hospital Stay

## 2023-07-20 ENCOUNTER — Inpatient Hospital Stay (HOSPITAL_BASED_OUTPATIENT_CLINIC_OR_DEPARTMENT_OTHER): Attending: Oncology | Admitting: Oncology

## 2023-07-20 ENCOUNTER — Inpatient Hospital Stay: Attending: Oncology

## 2023-07-20 VITALS — BP 126/53 | HR 72 | Temp 96.4°F | Resp 18 | Wt 240.2 lb

## 2023-07-20 DIAGNOSIS — M549 Dorsalgia, unspecified: Secondary | ICD-10-CM | POA: Insufficient documentation

## 2023-07-20 DIAGNOSIS — R252 Cramp and spasm: Secondary | ICD-10-CM | POA: Insufficient documentation

## 2023-07-20 DIAGNOSIS — Z452 Encounter for adjustment and management of vascular access device: Secondary | ICD-10-CM | POA: Diagnosis not present

## 2023-07-20 DIAGNOSIS — Z171 Estrogen receptor negative status [ER-]: Secondary | ICD-10-CM

## 2023-07-20 DIAGNOSIS — M545 Low back pain, unspecified: Secondary | ICD-10-CM

## 2023-07-20 DIAGNOSIS — C50912 Malignant neoplasm of unspecified site of left female breast: Secondary | ICD-10-CM | POA: Diagnosis present

## 2023-07-20 DIAGNOSIS — G8929 Other chronic pain: Secondary | ICD-10-CM

## 2023-07-20 DIAGNOSIS — C50911 Malignant neoplasm of unspecified site of right female breast: Secondary | ICD-10-CM | POA: Diagnosis not present

## 2023-07-20 DIAGNOSIS — Z95828 Presence of other vascular implants and grafts: Secondary | ICD-10-CM

## 2023-07-20 DIAGNOSIS — R300 Dysuria: Secondary | ICD-10-CM | POA: Insufficient documentation

## 2023-07-20 DIAGNOSIS — N6320 Unspecified lump in the left breast, unspecified quadrant: Secondary | ICD-10-CM

## 2023-07-20 LAB — URINALYSIS, COMPLETE (UACMP) WITH MICROSCOPIC
Bilirubin Urine: NEGATIVE
Glucose, UA: NEGATIVE mg/dL
Ketones, ur: NEGATIVE mg/dL
Nitrite: NEGATIVE
Protein, ur: NEGATIVE mg/dL
Specific Gravity, Urine: 1.016 (ref 1.005–1.030)
pH: 5 (ref 5.0–8.0)

## 2023-07-20 LAB — CBC WITH DIFFERENTIAL (CANCER CENTER ONLY)
Abs Immature Granulocytes: 0.03 10*3/uL (ref 0.00–0.07)
Basophils Absolute: 0 10*3/uL (ref 0.0–0.1)
Basophils Relative: 1 %
Eosinophils Absolute: 0.1 10*3/uL (ref 0.0–0.5)
Eosinophils Relative: 1 %
HCT: 33.2 % — ABNORMAL LOW (ref 36.0–46.0)
Hemoglobin: 10.3 g/dL — ABNORMAL LOW (ref 12.0–15.0)
Immature Granulocytes: 1 %
Lymphocytes Relative: 23 %
Lymphs Abs: 1.5 10*3/uL (ref 0.7–4.0)
MCH: 25.2 pg — ABNORMAL LOW (ref 26.0–34.0)
MCHC: 31 g/dL (ref 30.0–36.0)
MCV: 81.2 fL (ref 80.0–100.0)
Monocytes Absolute: 0.5 10*3/uL (ref 0.1–1.0)
Monocytes Relative: 7 %
Neutro Abs: 4.5 10*3/uL (ref 1.7–7.7)
Neutrophils Relative %: 67 %
Platelet Count: 272 10*3/uL (ref 150–400)
RBC: 4.09 MIL/uL (ref 3.87–5.11)
RDW: 14.8 % (ref 11.5–15.5)
WBC Count: 6.6 10*3/uL (ref 4.0–10.5)
nRBC: 0 % (ref 0.0–0.2)

## 2023-07-20 LAB — CMP (CANCER CENTER ONLY)
ALT: 16 U/L (ref 0–44)
AST: 18 U/L (ref 15–41)
Albumin: 3.3 g/dL — ABNORMAL LOW (ref 3.5–5.0)
Alkaline Phosphatase: 99 U/L (ref 38–126)
Anion gap: 8 (ref 5–15)
BUN: 14 mg/dL (ref 8–23)
CO2: 24 mmol/L (ref 22–32)
Calcium: 8.6 mg/dL — ABNORMAL LOW (ref 8.9–10.3)
Chloride: 104 mmol/L (ref 98–111)
Creatinine: 0.93 mg/dL (ref 0.44–1.00)
GFR, Estimated: >60 mL/min (ref >60)
Glucose, Bld: 141 mg/dL — ABNORMAL HIGH (ref 70–99)
Potassium: 3.6 mmol/L (ref 3.5–5.1)
Sodium: 136 mmol/L (ref 135–145)
Total Bilirubin: 0.7 mg/dL (ref 0.0–1.2)
Total Protein: 7.1 g/dL (ref 6.5–8.1)

## 2023-07-20 LAB — MAGNESIUM: Magnesium: 1.7 mg/dL (ref 1.7–2.4)

## 2023-07-20 MED ORDER — HEPARIN SOD (PORK) LOCK FLUSH 100 UNIT/ML IV SOLN
500.0000 [IU] | Freq: Once | INTRAVENOUS | Status: AC
  Administered 2023-07-20: 500 [IU] via INTRAVENOUS
  Filled 2023-07-20: qty 5

## 2023-07-20 MED ORDER — SULFAMETHOXAZOLE-TRIMETHOPRIM 800-160 MG PO TABS: 1.0000 | ORAL_TABLET | Freq: Two times a day (BID) | ORAL | 0 refills | Status: DC

## 2023-07-20 NOTE — Assessment & Plan Note (Signed)
 Continue Slow Mag 1 tab BID

## 2023-07-20 NOTE — Assessment & Plan Note (Signed)
 Recommend patient to take calcium 1200mg  daily, otc supply.  Take vitamin D supplementation.

## 2023-07-20 NOTE — Assessment & Plan Note (Addendum)
 Left breast cancer, cT4b N3 Mx, ER/PR-, HER2 + S/p neoadjuvant chemotherapy 6 cycles of TCHP  S/p left lumpectomy  + SLNB -ypT0 ypN0, complete pathological response.  S/p adjuvant Transtuzumab and Pertuzumab  11 cycles.  S/p adjuvant radiation-   Sept 2024 echocardiogram- stable LVEF-  Recommend adjuvant Zometa- she declined.  Left breast seroma s/p aspiration.   Today's breast examination is concerning for acute mastitis. Will obtain mammogram and US  of left breast.  Empiric Bactrim  BID for 5 days. Discussed with Dr. Charmel Cooter who will also see patient for evaluation.

## 2023-07-20 NOTE — Progress Notes (Signed)
 Hematology/Oncology Progress note [phone] [phone]     CHIEF COMPLAINTS/REASON FOR VISIT:  Follow-up for Stage IIIB left breast cancer, ER-, HER2+  ASSESSMENT & PLAN:   Cancer Staging  Breast cancer [facility]) Staging form: Breast, AJCC 8th Edition - Clinical stage from 08/21/2021: Stage IIIB (cT4b, cN3c, cM0, G3, ER-, PR-, HER2+) - Signed by Timmy Forbes, MD on 09/12/2021  Breast cancer (HCC) Left breast cancer, cT4b N3 Mx, ER/PR-, HER2 + S/p neoadjuvant chemotherapy 6 cycles of TCHP  S/p left lumpectomy  + SLNB -ypT0 ypN0, complete pathological response.  S/p adjuvant Transtuzumab and Pertuzumab  11 cycles.  S/p adjuvant radiation-   Sept 2024 echocardiogram- stable LVEF-  Recommend adjuvant Zometa- she declined.  Left breast seroma s/p aspiration.   Today's breast examination is concerning for acute mastitis. Will obtain mammogram and US  of left breast.  Empiric Bactrim  BID for 5 days. Discussed with Dr. Charmel Cooter who will also see patient for evaluation.   Port-A-Cath in place Continue port flush Q6-8 weeks.   Hypomagnesemia Continue Slow Mag 1 tab BID   Muscle cramps She continues to have symptoms. calcium  and vitamin D  supplementation.  Continue  Back pain Chronic issue for her.  Previous lumbar xray showed stable spondylosis and spondylolisthesis.  bone scan-negative for metastatic disease.      Hypocalcemia Recommend patient to take calcium  1200mg  daily, otc supply.  Take vitamin D  supplementation.   Dysuria UA positive for leukocytes, negative nitrite. Urine culture is pending.     Orders Placed This Encounter  Procedures   Urine Culture    Standing Status:   Future    Number of Occurrences:   1    Expected Date:   07/20/2023    Expiration Date:   10/18/2023   MM 3D DIAGNOSTIC MAMMOGRAM UNILATERAL LEFT BREAST    Standing Status:   Future    Expected Date:   07/23/2023    Expiration Date:   07/19/2024    Reason for Exam (SYMPTOM  OR  DIAGNOSIS REQUIRED):   left breast pain and mass    Preferred imaging location?:   [facility]   US  LIMITED ULTRASOUND INCLUDING AXILLA LEFT BREAST     Standing Status:   Future    Expected Date:   07/23/2023    Expiration Date:   07/19/2024    Reason for Exam (SYMPTOM  OR DIAGNOSIS REQUIRED):   left breast pain and mass    Preferred imaging location?:   [facility]   Urinalysis, Complete w Microscopic    Standing Status:   Future    Number of Occurrences:   1    Expected Date:   07/20/2023    Expiration Date:   10/18/2023   Follow up in 3 weeks.  All questions were answered. The patient knows to call the clinic with any problems questions or concerns.  Return of visit:  3 weeks lab MD  +/- IV magnesium   Timmy Forbes, MD, PhD [facility] Hematology Oncology 07/20/2023      HISTORY OF PRESENTING ILLNESS:  Vanessa Fisher is a  63 y.o.  female with PMH listed below who was referred to me for evaluation of  left breast cancer SUMMARY OF ONCOLOGIC HISTORY: Oncology History  Breast cancer (HCC)  07/31/2021 Imaging   Bilateral diagnostic mammogram and US  showed 5 centimeter LEFT breast mass associated with pleomorphic calcifications is suspicious for invasive ductal carcinoma.At least 4 LEFT axillary lymph nodes with abnormal morphology   08/21/2021 Cancer Staging  Staging form: Breast, AJCC 8th Edition - Clinical: Stage IIIB (cT4b, cN3c, cM0, G3, ER-, PR-, HER2+) - Signed by Timmy Forbes, MD on 08/28/2021 Histologic grading system: 3 grade system  -08/21/21 left breast ultrasound-guided biopsy showed invasive mammary carcinoma, grade 3, ER/PR negative, HER2 positive.  Left axillary lymph node biopsy positive for macro metastatic mammary carcinoma, 8 mm in greatest extent.   08/30/2021 Imaging   MRI brain w wo contrast -No metastatic disease or acute intracranial abnormality. Essentially normal for age MRI appearance of the brain.    09/03/2021 Echocardiogram   1. Left  ventricular ejection fraction, by estimation, is 55 to 60%. Left ventricular ejection fraction by 3D volume is 58 %. The left ventricle has normal function. The left ventricle has no regional wall motion abnormalities. There is mild left  ventricular hypertrophy. Left ventricular diastolic parameters were normal.  2. Right ventricular systolic function is normal. The right ventricular size is normal.  3. The mitral valve is normal in structure. No evidence of mitral valve regurgitation.  4. The aortic valve was not well visualized. Aortic valve regurgitation is not visualized   09/10/2021 Imaging   PET scan showed Large hypermetabolic left breast mass and diffuse skin thickening,consistent with primary breast carcinoma. Hypermetabolic lymphadenopathy in the left axilla, left subpectoral region, and left supraclavicular region, consistent with metastatic disease. No evidence of metastatic disease within the abdomen or pelvis   09/12/2021 - 02/13/2022 Chemotherapy   Patient is on Treatment Plan : BREAST  Docetaxel  + Carboplatin  + Trastuzumab  + Pertuzumab   (TCHP) q21d       Genetic Testing   Negative genetic testing. No pathogenic variants identified on the [facility] Common Hereditary Cancers+RNA panel. The report date is 10/26/2021.  The Common Hereditary Cancers Panel + RNA offered by [facility] includes sequencing and/or deletion duplication testing of the following 47 genes: APC, ATM, AXIN2, BARD1, BMPR1A, BRCA1, BRCA2, BRIP1, CDH1, CDKN2A (p14ARF), CDKN2A (p16INK4a), CKD4, CHEK2, CTNNA1, DICER1, EPCAM (Deletion/duplication testing only), GREM1 (promoter region deletion/duplication testing only), KIT, MEN1, MLH1, MSH2, MSH3, MSH6, MUTYH, NBN, NF1, NHTL1, PALB2, PDGFRA, PMS2, POLD1, POLE, PTEN, RAD50, RAD51C, RAD51D, SDHB, SDHC, SDHD, SMAD4, SMARCA4. STK11, TP53, TSC1, TSC2, and VHL.  The following genes were evaluated for sequence changes only: SDHA and HOXB13 c.251G>A variant only.   02/09/2022  Echocardiogram   LVEF 55 to 60%    03/27/2022 Surgery   S/p left mastectomy + SLNB ypT0 ypN0,    04/24/2022 -  Chemotherapy   Patient is on Treatment Plan : BREAST Trastuzumab   + Pertuzumab  q21d x 13 cycles     05/06/2022 Echocardiogram   LVEF 55 to 60%    07/02/2022 - 08/03/2022 Radiation Therapy   Adjuvant breast radiation.    10/20/2022 Imaging   Bone scan whole body showed 1. Degenerative uptake at the sternoclavicular joints, shoulders,knees and hindfoot bilaterally. 2. No evidence for metastatic disease to the. 3. Uptake in the left breast may be related to postsurgical change or residual tumor.    + chronic nasal pressure, congestion, horse voice she attributes to sinusitis.   INTERVAL HISTORY Vanessa Fisher is a 63 y.o. female who has above history reviewed by me today presents for follow up visit for ER negative HER2 positive breast cancer  - acute visit for left breast pain for 2 weeks. She takes Tylenol  or aleve with some relieve. No fever or chills.  + bursing sensation during urination. No flank pain.  No fever or chills.  + lower extremity cramps.  MEDICAL HISTORY:  Past Medical History:  Diagnosis Date   Anemia    Anxiety    Arthritis    Asthma    WELL CONTROLLED   Bile acid malabsorption syndrome    Bradycardia    HAD AN ISSUE WITH THIS IN 2016-NO PROBLEMS SINCE   Breast cancer, left breast (HCC) 08/2021   Carpal tunnel syndrome on right    Depression    Diabetes mellitus without complication (HCC)    Diverticulitis    Gastric reflux    GERD (gastroesophageal reflux disease)    Hand pain 05/12/2016   Headache    H/O MIGRAINES   History of kidney stones    H/O   Lumbar radiculitis    Neck pain, chronic    Osteoarthritis of both knees    Panic attacks    Personal history of chemotherapy    Personal history of radiation therapy     SURGICAL HISTORY: Past Surgical History:  Procedure Laterality Date   BREAST BIOPSY Left 08/21/2021    Axilla Bx, Hydromarker, neg   BREAST BIOPSY Left 08/21/2021   US  Bx, Ribbon Clip, invasive mammary carcinoma   BREAST BIOPSY Left 03/12/2022   US  LT RADIO FREQUENCY TAG LOC US  GUIDE 03/12/2022 [facility]   BREAST BIOPSY Left 03/12/2022   US  LT RADIO FREQUENCY TAG EA ADD LESION LOC US  GUIDE 03/12/2022 [facility]   BREAST LUMPECTOMY Left 03/27/2022   CARPAL TUNNEL RELEASE Right 12/06/2017   Procedure: CARPAL TUNNEL RELEASE;  Surgeon: Marlynn Singer, MD;  Location: [facility] OR[facility];  Service: Orthopedics;  Laterality: Right;   COLONOSCOPY WITH PROPOFOL  N/A 06/11/2021   Procedure: COLONOSCOPY WITH PROPOFOL ;  Surgeon: Selena Daily, MD;  Location: [facility] ENDOSCOPY;  Service: Gastroenterology;  Laterality: N/A;   DORSAL COMPARTMENT RELEASE Right 12/06/2017   Procedure: RELEASE DORSAL COMPARTMENT (DEQUERVAIN);  Surgeon: Marlynn Singer, MD;  Location: [facility] ORS;  Service: Orthopedics;  Laterality: Right;   ESOPHAGOGASTRODUODENOSCOPY (EGD) WITH PROPOFOL  N/A 06/11/2021   Procedure: ESOPHAGOGASTRODUODENOSCOPY (EGD) WITH PROPOFOL ;  Surgeon: Selena Daily, MD;  Location: [facility] ENDOSCOPY;  Service: Gastroenterology;  Laterality: N/A;   PART MASTECTOMY,RADIO FREQUENCY LOCALIZER,AXILLARY SENTINEL NODE BIOPSY Left 03/27/2022   Procedure: PART MASTECTOMY,RADIO FREQUENCY LOCALIZER,AXILLARY SENTINEL NODE BIOPSY;  Surgeon: Eldred Grego, MD;  Location: [facility] ORS;  Service: General;  Laterality: Left;   PARTIAL HYSTERECTOMY     PORTACATH PLACEMENT N/A 09/24/2021   Procedure: INSERTION PORT-A-CATH;  Surgeon: Eldred Grego, MD;  Location: [facility] OR[facility];  Service: General;  Laterality: N/A;   TUBAL LIGATION      SOCIAL HISTORY: Social History   Socioeconomic History   Marital status: Single    Spouse name: Not on file   Number of children: Not on file   Years of education: Not on file   Highest education level: Not on file  Occupational History   Not on file  Tobacco Use   Smoking  status: Former    Types: Cigarettes   Smokeless tobacco: Never  Vaping Use   Vaping status: Never Used  Substance and Sexual Activity   Alcohol use: No   Drug use: No   Sexual activity: Not on file  Other Topics Concern   Not on file  Social History Narrative   Lives alone   Social Drivers of Health   Financial Resource Strain: Low Risk  (03/29/2023)   Received from [facility]   Overall Financial Resource Strain (CARDIA)    Difficulty of Paying Living Expenses: Not hard at all  Food Insecurity:  Food Insecurity Present (03/29/2023)   Received from [facility]   Hunger Vital Sign    Within the past 12 months, you worried that your food would run out before you got the money to buy more.: Sometimes true    Within the past 12 months, the food you bought just didn't last and you didn't have money to get more.: Sometimes true  Transportation Needs: No Transportation Needs (03/29/2023)   Received from [facility] - Transportation    In the past 12 months, has lack of transportation kept you from medical appointments or from getting medications?: No    Lack of Transportation (Non-Medical): No  Physical Activity: Inactive (08/15/2021)   Exercise Vital Sign    Days of Exercise per Week: 0 days    Minutes of Exercise per Session: 0 min  Stress: Stress Concern Present (08/15/2021)   [employer] of Occupational Health - Occupational Stress Questionnaire    Feeling of Stress : Rather much  Social Connections: Socially Isolated (08/15/2021)   Social Connection and Isolation Panel    Frequency of Communication with Friends and Family: Once a week    Frequency of Social Gatherings with Friends and Family: Once a week    Attends Religious Services: Never    Database administrator or Organizations: No    Attends Engineer, structural: Not on file    Marital Status: Never married  Intimate Partner Violence: Not At Risk  (08/15/2021)   Humiliation, Afraid, Rape, and Kick questionnaire    Fear of Current or Ex-Partner: No    Emotionally Abused: No    Physically Abused: No    Sexually Abused: No    FAMILY HISTORY: Family History  Problem Relation Age of Onset   Diabetes Mother    Hypertension Mother    Heart failure Mother    Pancreatic cancer Mother 52   Diabetes Father    Hypertension Father    Breast cancer Maternal Aunt    Cervical cancer Maternal Aunt    Lung cancer Son 17    ALLERGIES:  is allergic to bc powder [aspirin-salicylamide-caffeine], doxycycline , gabapentin , hydrocodone -acetaminophen , latex, penicillin g, penicillins, and shellfish allergy.  MEDICATIONS:  Current Outpatient Medications  Medication Sig Dispense Refill   albuterol  (PROVENTIL ) (2.5 MG/3ML) 0.083% nebulizer solution Take 3 mLs (2.5 mg total) by nebulization every 4 (four) hours as needed for wheezing or shortness of breath. 75 mL 2   albuterol  (VENTOLIN  HFA) 108 (90 Base) MCG/ACT inhaler Inhale 2 puffs into the lungs every 6 (six) hours as needed for wheezing or shortness of breath. 18 g 0   allopurinol (ZYLOPRIM) 100 MG tablet Take 100 mg by mouth daily.     atorvastatin (LIPITOR) 10 MG tablet Take 10 mg by mouth daily.     Azelastine  HCl 137 MCG/SPRAY SOLN Place 1 spray into the nose daily. 30 mL 0   calcium  carbonate (OS-CAL - DOSED IN MG OF ELEMENTAL CALCIUM ) 1250 (500 Ca) MG tablet Take 1 tablet (1,250 mg total) by mouth daily. 90 tablet 1   celecoxib (CELEBREX) 200 MG capsule Take by mouth 2 (two) times daily.     cetirizine  (ZYRTEC  ALLERGY) 10 MG tablet Take 1 tablet (10 mg total) by mouth daily. 90 tablet 0   cyclobenzaprine  (FLEXERIL ) 10 MG tablet Take 10 mg by mouth 3 (three) times daily.     diclofenac Sodium (VOLTAREN) 1 % GEL Apply topically.     diphenoxylate -atropine  (LOMOTIL ) 2.5-0.025  MG tablet Take 1 tablet by mouth 4 (four) times daily as needed for diarrhea or loose stools. 120 tablet 0    escitalopram  (LEXAPRO ) 10 MG tablet Take 1 tablet (10 mg total) by mouth daily. 30 tablet 1   esomeprazole  (NEXIUM ) 40 MG capsule Take 1 capsule (40 mg total) by mouth daily at 12 noon. 30 capsule 3   etodolac  (LODINE ) 500 MG tablet Take 1 tablet by mouth 2 (two) times daily.     FLOVENT  DISKUS 250 MCG/ACT AEPB Inhale 1 puff into the lungs daily.     fluticasone  (FLONASE ) 50 MCG/ACT nasal spray Place 2 sprays into both nostrils daily. 15.8 mL 0   Iron -Vitamin C  65-125 MG TABS Take 1 tablet by mouth daily at 2 PM. 30 tablet 2   KLOR-CON  M20 20 MEQ tablet TAKE 1 TABLET BY MOUTH TWICE A DAY 60 tablet 1   lidocaine -prilocaine  (EMLA ) cream Apply 1 Application topically as needed. 30 g 12   magnesium  chloride (SLOW-MAG) 64 MG TBEC SR tablet Take 1 tablet (64 mg total) by mouth 2 (two) times daily. 60 tablet 2   montelukast  (SINGULAIR ) 10 MG tablet Take by mouth.     oxyCODONE -acetaminophen  (PERCOCET) 5-325 MG tablet Take 1 tablet by mouth every 8 (eight) hours as needed for severe pain (pain score 7-10). 15 tablet 0   sulfamethoxazole -trimethoprim  (BACTRIM  DS) 800-160 MG tablet Take 1 tablet by mouth 2 (two) times daily. 10 tablet 0   zinc gluconate 50 MG tablet Take 50 mg by mouth as needed.     naloxone (NARCAN) nasal spray 4 mg/0.1 mL  (Patient not taking: Reported on 07/20/2023)     No current facility-administered medications for this visit.   Facility-Administered Medications Ordered in Other Visits  Medication Dose Route Frequency Provider Last Rate Last Admin   magnesium  sulfate IVPB 2 g 50 mL  2 g Intravenous Once Covington, Sarah M, PA-C        Review of Systems  Constitutional:  Negative for appetite change, chills, fatigue and fever.  HENT:   Negative for hearing loss and voice change.   Eyes:  Negative for eye problems.  Respiratory:  Negative for chest tightness and cough.   Cardiovascular:  Negative for chest pain.  Gastrointestinal:  Negative for abdominal distention, abdominal  pain, blood in stool and diarrhea.  Endocrine: Negative for hot flashes.  Genitourinary:  Negative for difficulty urinating and frequency.   Musculoskeletal:  Positive for arthralgias and back pain.       Leg cramps  Skin:  Negative for itching and rash.  Neurological:  Negative for extremity weakness and headaches.  Hematological:  Negative for adenopathy.  Psychiatric/Behavioral:  Negative for confusion.   Left breast pain   PHYSICAL EXAMINATION: ECOG PERFORMANCE STATUS: 1 - Symptomatic but completely ambulatory Vitals:   07/20/23 1058  BP: (!) 126/53  Pulse: 72  Resp: 18  Temp: (!) 96.4 F (35.8 C)  SpO2: 100%   Filed Weights   07/20/23 1058  Weight: 240 lb 3.2 oz (109 kg)    Physical Exam Constitutional:      General: She is not in acute distress.    Appearance: She is not diaphoretic.  HENT:     Head: Normocephalic and atraumatic.   Eyes:     General: No scleral icterus.   Cardiovascular:     Rate and Rhythm: Normal rate and regular rhythm.     Heart sounds: No murmur heard. Pulmonary:     Effort: Pulmonary  effort is normal. No respiratory distress.     Breath sounds: No wheezing.  Abdominal:     General: There is no distension.     Palpations: Abdomen is soft.     Tenderness: There is no abdominal tenderness.   Musculoskeletal:        General: Normal range of motion.     Cervical back: Normal range of motion and neck supple.   Skin:    General: Skin is warm and dry.     Findings: No erythema.   Neurological:     Mental Status: She is alert and oriented to person, place, and time.     Cranial Nerves: No cranial nerve deficit.     Motor: No abnormal muscle tone.     Coordination: Coordination normal.   Psychiatric:        Mood and Affect: Affect normal.     Comments: Anxious      Left breast is warm and swollen,  with a palpable, firm mass at the 3 o'clock position,   LABORATORY DATA:  I have reviewed the data as listed    Latest Ref  Rng & Units 07/20/2023   10:34 AM 04/28/2023    8:54 AM 12/23/2022    8:53 AM  CBC  WBC 4.0 - 10.5 K/uL 6.6  4.8  6.6   Hemoglobin 12.0 - 15.0 g/dL 16.1  09.6  9.8   Hematocrit 36.0 - 46.0 % 33.2  32.6  31.8   Platelets 150 - 400 K/uL 272  266  231       Latest Ref Rng & Units 07/20/2023   10:34 AM 04/28/2023    8:54 AM 12/23/2022    8:53 AM  CMP  Glucose 70 - 99 mg/dL 045  409  811   BUN 8 - 23 mg/dL 14  14  21    Creatinine 0.44 - 1.00 mg/dL 9.14  7.82  9.56   Sodium 135 - 145 mmol/L 136  137  138   Potassium 3.5 - 5.1 mmol/L 3.6  3.6  3.9   Chloride 98 - 111 mmol/L 104  104  106   CO2 22 - 32 mmol/L 24  25  24    Calcium  8.9 - 10.3 mg/dL 8.6  8.9  8.8   Total Protein 6.5 - 8.1 g/dL 7.1  6.6  6.8   Total Bilirubin 0.0 - 1.2 mg/dL 0.7  0.4  0.3   Alkaline Phos 38 - 126 U/L 99  101  104   AST 15 - 41 U/L 18  20  16    ALT 0 - 44 U/L 16  14  20        RADIOGRAPHIC STUDIES: I have personally reviewed the radiological images as listed and agreed with the findings in the report. No results found.

## 2023-07-20 NOTE — Assessment & Plan Note (Signed)
Continue port flush Q6-8 weeks.

## 2023-07-20 NOTE — Assessment & Plan Note (Signed)
 Chronic issue for her.  Previous lumbar xray showed stable spondylosis and spondylolisthesis.  bone scan-negative for metastatic disease.

## 2023-07-20 NOTE — Assessment & Plan Note (Addendum)
 She continues to have symptoms. calcium  and vitamin D  supplementation.  Continue

## 2023-07-20 NOTE — Assessment & Plan Note (Signed)
 UA positive for leukocytes, negative nitrite. Urine culture is pending.

## 2023-07-20 NOTE — Progress Notes (Signed)
 [facility] CSW Progress Note  Clinical Child psychotherapist contacted patient by phone to discuss breast cancer grants.  Confirmed with patient that CSW received the grant paperwork.  Reviewed with patient and will make copies and put in the mail.    Kennth Peal, LCSW Clinical Social Worker [facility]    Patient is participating in a Managed Medicaid Plan:  Yes

## 2023-07-21 LAB — URINE CULTURE: Culture: NO GROWTH

## 2023-07-23 ENCOUNTER — Inpatient Hospital Stay

## 2023-07-23 NOTE — Progress Notes (Signed)
 [facility] CSW Progress Note  Clinical Child psychotherapist contacted patient by phone to discuss breast cancer grants.  Discussed with patient information that was required on the [facility] Application but that she did not have, including a W2.  CSW to mail application anyway with a note to contact patient with additional questions.  Patient stated she understood.    Kennth Peal, LCSW Clinical Social Worker [facility]    Patient is participating in a Managed Medicaid Plan:  Yes

## 2023-07-25 ENCOUNTER — Ambulatory Visit: Payer: Self-pay | Admitting: Oncology

## 2023-07-26 NOTE — Progress Notes (Signed)
 Spoke to pt and informed her that she does not have a UTI. Pt states that she saw Dr. Cesar on 6/19 and some fluid was drained from the breast. She is scheduled for mammogram on 6/25 and to follow up again with Dr. cesar on 6/27,

## 2023-07-28 ENCOUNTER — Ambulatory Visit
Admission: RE | Admit: 2023-07-28 | Discharge: 2023-07-28 | Disposition: A | Discharge status: Home / Self Care | Source: Ambulatory Visit | Attending: Oncology | Admitting: Oncology

## 2023-07-28 ENCOUNTER — Other Ambulatory Visit

## 2023-07-28 ENCOUNTER — Ambulatory Visit

## 2023-07-28 ENCOUNTER — Ambulatory Visit: Admitting: Oncology

## 2023-07-28 ENCOUNTER — Telehealth: Payer: Self-pay

## 2023-07-28 ENCOUNTER — Other Ambulatory Visit: Payer: Self-pay | Admitting: Oncology

## 2023-07-28 DIAGNOSIS — N6320 Unspecified lump in the left breast, unspecified quadrant: Secondary | ICD-10-CM

## 2023-07-28 DIAGNOSIS — N6321 Unspecified lump in the left breast, upper outer quadrant: Secondary | ICD-10-CM | POA: Diagnosis present

## 2023-07-28 DIAGNOSIS — N6489 Other specified disorders of breast: Secondary | ICD-10-CM | POA: Diagnosis not present

## 2023-07-28 DIAGNOSIS — M96843 Postprocedural seroma of a musculoskeletal structure following other procedure: Secondary | ICD-10-CM | POA: Insufficient documentation

## 2023-07-28 MED ORDER — SULFAMETHOXAZOLE-TRIMETHOPRIM 800-160 MG PO TABS: 1.0000 | ORAL_TABLET | Freq: Two times a day (BID) | ORAL | 0 refills | Status: DC

## 2023-07-28 NOTE — Telephone Encounter (Signed)
 Called pt and unable to reach her. Left detailed message letting her know that Dr. Babara would like for her to continue Bactrim  and she has sent a refill to [facility]. Also informed her that the antibiotic is for the breast issues not for UTI, since urinalysis did not show UTI.  Also called her daughter Wanda, but had to leave VM.

## 2023-07-29 ENCOUNTER — Encounter: Payer: Self-pay | Admitting: Oncology

## 2023-07-29 ENCOUNTER — Other Ambulatory Visit: Payer: Self-pay | Admitting: Oncology

## 2023-07-29 DIAGNOSIS — N6489 Other specified disorders of breast: Secondary | ICD-10-CM

## 2023-07-29 NOTE — Telephone Encounter (Signed)
 Spoke to pt and clarified the reason for antibiotic and also notified her of refill. Pt verbalized understanding.

## 2023-08-02 ENCOUNTER — Inpatient Hospital Stay
Admission: RE | Admit: 2023-08-02 | Discharge: 2023-08-02 | Discharge status: Home / Self Care | Source: Ambulatory Visit | Attending: Oncology

## 2023-08-02 DIAGNOSIS — N6489 Other specified disorders of breast: Secondary | ICD-10-CM | POA: Diagnosis present

## 2023-08-02 MED ORDER — LIDOCAINE HCL 1 % IJ SOLN
5.0000 mL | Freq: Once | INTRAMUSCULAR | Status: AC
  Administered 2023-08-02: 5 mL
  Filled 2023-08-02: qty 5

## 2023-08-03 ENCOUNTER — Ambulatory Visit: Payer: Self-pay | Admitting: Pediatrics

## 2023-08-05 ENCOUNTER — Ambulatory Visit: Payer: Self-pay | Admitting: Pediatrics

## 2023-08-09 ENCOUNTER — Telehealth: Payer: Self-pay

## 2023-08-09 NOTE — Telephone Encounter (Signed)
 Please schedule and notify pt of appt:   MD in approx 1-2 weeks (breast evaluation)

## 2023-08-10 ENCOUNTER — Encounter: Payer: Self-pay | Admitting: Oncology

## 2023-08-14 ENCOUNTER — Encounter: Payer: Self-pay | Admitting: Oncology

## 2023-08-16 ENCOUNTER — Inpatient Hospital Stay: Attending: Oncology | Admitting: Oncology

## 2023-08-16 ENCOUNTER — Encounter: Payer: Self-pay | Admitting: Oncology

## 2023-08-16 VITALS — BP 122/62 | HR 66 | Temp 97.4°F | Resp 18 | Wt 238.7 lb

## 2023-08-16 DIAGNOSIS — D649 Anemia, unspecified: Secondary | ICD-10-CM | POA: Diagnosis not present

## 2023-08-16 DIAGNOSIS — K5900 Constipation, unspecified: Secondary | ICD-10-CM | POA: Insufficient documentation

## 2023-08-16 DIAGNOSIS — C50412 Malignant neoplasm of upper-outer quadrant of left female breast: Secondary | ICD-10-CM | POA: Insufficient documentation

## 2023-08-16 DIAGNOSIS — C50911 Malignant neoplasm of unspecified site of right female breast: Secondary | ICD-10-CM | POA: Diagnosis not present

## 2023-08-16 DIAGNOSIS — Z171 Estrogen receptor negative status [ER-]: Secondary | ICD-10-CM | POA: Insufficient documentation

## 2023-08-16 DIAGNOSIS — Z95828 Presence of other vascular implants and grafts: Secondary | ICD-10-CM

## 2023-08-16 DIAGNOSIS — K5903 Drug induced constipation: Secondary | ICD-10-CM

## 2023-08-16 MED ORDER — DOCUSATE SODIUM 100 MG PO CAPS: 100.0000 mg | ORAL_CAPSULE | Freq: Two times a day (BID) | ORAL | Status: AC

## 2023-08-16 NOTE — Assessment & Plan Note (Signed)
 This may contribute to her intermittent bloating and abdominal discomfort.  Recommend Colace 100mg  BID and Miralax 17g daily PRN

## 2023-08-16 NOTE — Assessment & Plan Note (Signed)
 Hemoglobin is stable. Monitor counts. She may stop iron  supplementation due to constipation.

## 2023-08-16 NOTE — Assessment & Plan Note (Signed)
Continue port flush Q6-8 weeks.

## 2023-08-16 NOTE — Progress Notes (Signed)
 Hematology/Oncology Progress note [phone] [phone]     CHIEF COMPLAINTS/REASON FOR VISIT:  Follow-up for Stage IIIB left breast cancer, ER-, HER2+  ASSESSMENT & PLAN:   Cancer Staging  Breast cancer Vanessa Fisher) Staging form: Breast, AJCC 8th Edition - Clinical stage from 08/21/2021: Stage IIIB (cT4b, cN3c, cM0, G3, ER-, PR-, HER2+) - Signed by [name], MD on 09/12/2021  Breast cancer (HCC) Left breast cancer, cT4b N3 Mx, ER/PR-, HER2 + S/p neoadjuvant chemotherapy 6 cycles of TCHP  S/p left lumpectomy  + SLNB -ypT0 ypN0, complete pathological response.  S/p adjuvant Transtuzumab and Pertuzumab  11 cycles.  S/p adjuvant radiation-   Sept 2024 echocardiogram- stable LVEF-  Recommend adjuvant Zometa- she declined.  Left breast seroma s/p aspiration.   Today's breast examination showed improved left breast mastitis. Mammogram results were reviewed with patient. I will hold off additional antibiotics for now.  Recommend patient to follow up with  Dr. Cesar evaluation.   Hypocalcemia Recommend patient to take calcium  1200mg  daily, otc supply.  Take vitamin D  supplementation.   Hypomagnesemia Continue Slow Mag 1 tab BID   Normocytic anemia Hemoglobin is stable. Monitor counts. She may stop iron  supplementation due to constipation.   Port-A-Cath in place Continue port flush Q6-8 weeks.   Constipation This may contribute to her intermittent bloating and abdominal discomfort.  Recommend Colace 100mg  BID and Miralax 17g daily PRN    Orders Placed This Encounter  Procedures   CBC with Differential ([facility] Only)    Standing Status:   Future    Expected Date:   11/16/2023    Expiration Date:   02/14/2024   CMP ([facility] only)    Standing Status:   Future    Expected Date:   11/16/2023    Expiration Date:   02/14/2024   Iron  and TIBC    Standing Status:   Future    Expected Date:   11/16/2023    Expiration Date:   02/14/2024   Ferritin     Standing Status:   Future    Expected Date:   11/16/2023    Expiration Date:   02/14/2024   Retic Panel    Standing Status:   Future    Expected Date:   11/16/2023    Expiration Date:   02/14/2024   Follow up in 3 months.  All questions were answered. The patient knows to call the clinic with any problems questions or concerns.   Call Babara, MD, PhD [facility] Hematology Oncology 08/16/2023      HISTORY OF PRESENTING ILLNESS:  Vanessa Fisher is a  63 y.o.  female with PMH listed below who was referred to me for evaluation of  left breast cancer SUMMARY OF ONCOLOGIC HISTORY: Oncology History  Breast cancer (HCC)  07/31/2021 Imaging   Bilateral diagnostic mammogram and US  showed 5 centimeter LEFT breast mass associated with pleomorphic calcifications is suspicious for invasive ductal carcinoma.At least 4 LEFT axillary lymph nodes with abnormal morphology   08/21/2021 Cancer Staging   Staging form: Breast, AJCC 8th Edition - Clinical: Stage IIIB (cT4b, cN3c, cM0, G3, ER-, PR-, HER2+) - Signed by [name], MD on 08/28/2021 Histologic grading system: 3 grade system  -08/21/21 left breast ultrasound-guided biopsy showed invasive mammary carcinoma, grade 3, ER/PR negative, HER2 positive.  Left axillary lymph node biopsy positive for macro metastatic mammary carcinoma, 8 mm in greatest extent.   08/30/2021 Imaging   MRI brain w wo contrast -No metastatic disease or acute intracranial  abnormality. Essentially normal for age MRI appearance of the brain.    09/03/2021 Echocardiogram   1. Left ventricular ejection fraction, by estimation, is 55 to 60%. Left ventricular ejection fraction by 3D volume is 58 %. The left ventricle has normal function. The left ventricle has no regional wall motion abnormalities. There is mild left  ventricular hypertrophy. Left ventricular diastolic parameters were normal.  2. Right ventricular systolic function is normal. The right ventricular size is  normal.  3. The mitral valve is normal in structure. No evidence of mitral valve regurgitation.  4. The aortic valve was not well visualized. Aortic valve regurgitation is not visualized   09/10/2021 Imaging   PET scan showed Large hypermetabolic left breast mass and diffuse skin thickening,consistent with primary breast carcinoma. Hypermetabolic lymphadenopathy in the left axilla, left subpectoral region, and left supraclavicular region, consistent with metastatic disease. No evidence of metastatic disease within the abdomen or pelvis   09/12/2021 - 02/13/2022 Chemotherapy   Patient is on Treatment Plan : BREAST  Docetaxel  + Carboplatin  + Trastuzumab  + Pertuzumab   (TCHP) q21d       Genetic Testing   Negative genetic testing. No pathogenic variants identified on the [facility] Common Hereditary Cancers+RNA panel. The report date is 10/26/2021.  The Common Hereditary Cancers Panel + RNA offered by [facility] includes sequencing and/or deletion duplication testing of the following 47 genes: APC, ATM, AXIN2, BARD1, BMPR1A, BRCA1, BRCA2, BRIP1, CDH1, CDKN2A (p14ARF), CDKN2A (p16INK4a), CKD4, CHEK2, CTNNA1, DICER1, EPCAM (Deletion/duplication testing only), GREM1 (promoter region deletion/duplication testing only), KIT, MEN1, MLH1, MSH2, MSH3, MSH6, MUTYH, NBN, NF1, NHTL1, PALB2, PDGFRA, PMS2, POLD1, POLE, PTEN, RAD50, RAD51C, RAD51D, SDHB, SDHC, SDHD, SMAD4, SMARCA4. STK11, TP53, TSC1, TSC2, and VHL.  The following genes were evaluated for sequence changes only: SDHA and HOXB13 c.251G>A variant only.   02/09/2022 Echocardiogram   LVEF 55 to 60%    03/27/2022 Surgery   S/p left mastectomy + SLNB ypT0 ypN0,    04/24/2022 -  Chemotherapy   Patient is on Treatment Plan : BREAST Trastuzumab   + Pertuzumab  q21d x 13 cycles     05/06/2022 Echocardiogram   LVEF 55 to 60%    07/02/2022 - 08/03/2022 Radiation Therapy   Adjuvant breast radiation.    10/20/2022 Imaging   Bone scan whole body showed 1. Degenerative  uptake at the sternoclavicular joints, shoulders,knees and hindfoot bilaterally. 2. No evidence for metastatic disease to the. 3. Uptake in the left breast may be related to postsurgical change or residual tumor.    + chronic nasal pressure, congestion, horse voice she attributes to sinusitis.   INTERVAL HISTORY Vanessa Fisher is a 63 y.o. female who has above history reviewed by me today presents for follow up visit for ER negative HER2 positive breast cancer  -  left breast pain has resolved. She finished a course of Bactrim  for left mastitis.  Mammogram findings are consistent with mastitis. S/p seroma aspiration.  No fever or chills.  Last Oct, she has complained right upper quadrant pain and was recommended to get CT abdomen done. She did not get CT done due to improvement of symptoms. She endorses intermittent mild abdominal pain that moves around, sometimes RUQ, sometimes LUQ. She is constipated.    MEDICAL HISTORY:  Past Medical History:  Diagnosis Date   Anemia    Anxiety    Arthritis    Asthma    WELL CONTROLLED   Bile acid malabsorption syndrome    Bradycardia    HAD AN  ISSUE WITH THIS IN 2016-NO PROBLEMS SINCE   Breast cancer, left breast (HCC) 08/2021   Carpal tunnel syndrome on right    Depression    Diabetes mellitus without complication (HCC)    Diverticulitis    Gastric reflux    GERD (gastroesophageal reflux disease)    Hand pain 05/12/2016   Headache    H/O MIGRAINES   History of kidney stones    H/O   Lumbar radiculitis    Neck pain, chronic    Osteoarthritis of both knees    Panic attacks    Personal history of chemotherapy    Personal history of radiation therapy     SURGICAL HISTORY: Past Surgical History:  Procedure Laterality Date   BREAST BIOPSY Left 08/21/2021   Axilla Bx, Hydromarker, neg   BREAST BIOPSY Left 08/21/2021   US  Bx, Ribbon Clip, invasive mammary carcinoma   BREAST BIOPSY Left 03/12/2022   US  LT RADIO FREQUENCY TAG  LOC US  GUIDE 03/12/2022 [facility]   BREAST BIOPSY Left 03/12/2022   US  LT RADIO FREQUENCY TAG EA ADD LESION LOC US  GUIDE 03/12/2022 [facility]   BREAST LUMPECTOMY Left 03/27/2022   CARPAL TUNNEL RELEASE Right 12/06/2017   Procedure: CARPAL TUNNEL RELEASE;  Surgeon: Cleotilde Barrio, MD;  Location: [facility];  Service: Orthopedics;  Laterality: Right;   COLONOSCOPY WITH PROPOFOL  N/A 06/11/2021   Procedure: COLONOSCOPY WITH PROPOFOL ;  Surgeon: Unk Corinn Skiff, MD;  Location: [facility];  Service: Gastroenterology;  Laterality: N/A;   DORSAL COMPARTMENT RELEASE Right 12/06/2017   Procedure: RELEASE DORSAL COMPARTMENT (DEQUERVAIN);  Surgeon: Cleotilde Barrio, MD;  Location: [facility];  Service: Orthopedics;  Laterality: Right;   ESOPHAGOGASTRODUODENOSCOPY (EGD) WITH PROPOFOL  N/A 06/11/2021   Procedure: ESOPHAGOGASTRODUODENOSCOPY (EGD) WITH PROPOFOL ;  Surgeon: Unk Corinn Skiff, MD;  Location: [facility];  Service: Gastroenterology;  Laterality: N/A;   PART MASTECTOMY,RADIO FREQUENCY LOCALIZER,AXILLARY SENTINEL NODE BIOPSY Left 03/27/2022   Procedure: PART MASTECTOMY,RADIO FREQUENCY LOCALIZER,AXILLARY SENTINEL NODE BIOPSY;  Surgeon: Rodolph Romano, MD;  Location: [facility];  Service: General;  Laterality: Left;   PARTIAL HYSTERECTOMY     PORTACATH PLACEMENT N/A 09/24/2021   Procedure: INSERTION PORT-A-CATH;  Surgeon: Rodolph Romano, MD;  Location: [facility];  Service: General;  Laterality: N/A;   TUBAL LIGATION      SOCIAL HISTORY: Social History   Socioeconomic History   Marital status: Single    Spouse name: Not on file   Number of children: Not on file   Years of education: Not on file   Highest education level: Not on file  Occupational History   Not on file  Tobacco Use   Smoking status: Former    Types: Cigarettes   Smokeless tobacco: Never  Vaping Use   Vaping status: Never Used  Substance and Sexual Activity   Alcohol use: No   Drug use: No    Sexual activity: Not on file  Other Topics Concern   Not on file  Social History Narrative   Lives alone   Social Drivers of Health   Financial Resource Strain: Low Risk  (03/29/2023)   Received from [facility]   Overall Financial Resource Strain (CARDIA)    Difficulty of Paying Living Expenses: Not hard at all  Food Insecurity: Food Insecurity Present (03/29/2023)   Received from [facility]   Hunger Vital Sign    Within the past 12 months, you worried that your food would run out before you got the money to buy more.: Sometimes true  Within the past 12 months, the food you bought just didn't last and you didn't have money to get more.: Sometimes true  Transportation Needs: No Transportation Needs (03/29/2023)   Received [facility] - Transportation    In the past 12 months, has lack of transportation kept you from medical appointments or from getting medications?: No    Lack of Transportation (Non-Medical): No  Physical Activity: Inactive (08/15/2021)   Exercise Vital Sign    Days of Exercise per Week: 0 days    Minutes of Exercise per Session: 0 min  Stress: Stress Concern Present (08/15/2021)   [employer] of Occupational Health - Occupational Stress Questionnaire    Feeling of Stress : Rather much  Social Connections: Socially Isolated (08/15/2021)   Social Connection and Isolation Panel    Frequency of Communication with Friends and Family: Once a week    Frequency of Social Gatherings with Friends and Family: Once a week    Attends Religious Services: Never    Database administrator or Organizations: No    Attends Engineer, structural: Not on file    Marital Status: Never married  Intimate Partner Violence: Not At Risk (08/15/2021)   Humiliation, Afraid, Rape, and Kick questionnaire    Fear of Current or Ex-Partner: No    Emotionally Abused: No    Physically Abused: No    Sexually Abused:  No    FAMILY HISTORY: Family History  Problem Relation Age of Onset   Diabetes Mother    Hypertension Mother    Heart failure Mother    Pancreatic cancer Mother 82   Diabetes Father    Hypertension Father    Breast cancer Maternal Aunt    Cervical cancer Maternal Aunt    Lung cancer Son 4    ALLERGIES:  is allergic to bc powder [aspirin-salicylamide-caffeine], doxycycline , gabapentin , hydrocodone -acetaminophen , latex, penicillin g, penicillins, and shellfish allergy.  MEDICATIONS:  Current Outpatient Medications  Medication Sig Dispense Refill   albuterol  (PROVENTIL ) (2.5 MG/3ML) 0.083% nebulizer solution Take 3 mLs (2.5 mg total) by nebulization every 4 (four) hours as needed for wheezing or shortness of breath. 75 mL 2   albuterol  (VENTOLIN  HFA) 108 (90 Base) MCG/ACT inhaler Inhale 2 puffs into the lungs every 6 (six) hours as needed for wheezing or shortness of breath. 18 g 0   allopurinol (ZYLOPRIM) 100 MG tablet Take 100 mg by mouth daily.     atorvastatin (LIPITOR) 10 MG tablet Take 10 mg by mouth daily.     Azelastine  HCl 137 MCG/SPRAY SOLN Place 1 spray into the nose daily. 30 mL 0   calcium  carbonate (OS-CAL - DOSED IN MG OF ELEMENTAL CALCIUM ) 1250 (500 Ca) MG tablet Take 1 tablet (1,250 mg total) by mouth daily. 90 tablet 1   celecoxib (CELEBREX) 200 MG capsule Take by mouth 2 (two) times daily.     cetirizine  (ZYRTEC  ALLERGY) 10 MG tablet Take 1 tablet (10 mg total) by mouth daily. 90 tablet 0   cyclobenzaprine  (FLEXERIL ) 10 MG tablet Take 10 mg by mouth 3 (three) times daily.     diclofenac Sodium (VOLTAREN) 1 % GEL Apply topically.     diphenoxylate -atropine  (LOMOTIL ) 2.5-0.025 MG tablet Take 1 tablet by mouth 4 (four) times daily as needed for diarrhea or loose stools. 120 tablet 0   docusate sodium  (COLACE) 100 MG capsule Take 1 capsule (100 mg total) by mouth 2 (two) times daily.     escitalopram  (  LEXAPRO ) 10 MG tablet Take 1 tablet (10 mg total) by mouth daily. 30  tablet 1   esomeprazole  (NEXIUM ) 40 MG capsule Take 1 capsule (40 mg total) by mouth daily at 12 noon. 30 capsule 3   etodolac  (LODINE ) 500 MG tablet Take 1 tablet by mouth 2 (two) times daily.     FLOVENT  DISKUS 250 MCG/ACT AEPB Inhale 1 puff into the lungs daily.     fluticasone  (FLONASE ) 50 MCG/ACT nasal spray Place 2 sprays into both nostrils daily. 15.8 mL 0   Iron -Vitamin C  65-125 MG TABS Take 1 tablet by mouth daily at 2 PM. 30 tablet 2   KLOR-CON  M20 20 MEQ tablet TAKE 1 TABLET BY MOUTH TWICE A DAY 60 tablet 1   lidocaine -prilocaine  (EMLA ) cream Apply 1 Application topically as needed. 30 g 12   magnesium  chloride (SLOW-MAG) 64 MG TBEC SR tablet Take 1 tablet (64 mg total) by mouth 2 (two) times daily. 60 tablet 2   montelukast  (SINGULAIR ) 10 MG tablet Take by mouth.     oxyCODONE -acetaminophen  (PERCOCET) 5-325 MG tablet Take 1 tablet by mouth every 8 (eight) hours as needed for severe pain (pain score 7-10). 15 tablet 0   sulfamethoxazole -trimethoprim  (BACTRIM  DS) 800-160 MG tablet Take 1 tablet by mouth 2 (two) times daily. 10 tablet 0   zinc gluconate 50 MG tablet Take 50 mg by mouth as needed.     naloxone (NARCAN) nasal spray 4 mg/0.1 mL  (Patient not taking: Reported on 08/16/2023)     No current facility-administered medications for this visit.   Facility-Administered Medications Ordered in Other Visits  Medication Dose Route Frequency Provider Last Rate Last Admin   magnesium  sulfate IVPB 2 g 50 mL  2 g Intravenous Once Covington, Sarah M, PA-C        Review of Systems  Constitutional:  Negative for appetite change, chills, fatigue and fever.  HENT:   Negative for hearing loss and voice change.   Eyes:  Negative for eye problems.  Respiratory:  Negative for chest tightness and cough.   Cardiovascular:  Negative for chest pain.  Gastrointestinal:  Negative for abdominal distention, abdominal pain, blood in stool and diarrhea.  Endocrine: Negative for hot flashes.   Genitourinary:  Negative for difficulty urinating and frequency.   Musculoskeletal:  Positive for arthralgias and back pain.       Leg cramps  Skin:  Negative for itching and rash.  Neurological:  Negative for extremity weakness and headaches.  Hematological:  Negative for adenopathy.  Psychiatric/Behavioral:  Negative for confusion.   Left breast pain   PHYSICAL EXAMINATION: ECOG PERFORMANCE STATUS: 1 - Symptomatic but completely ambulatory Vitals:   08/16/23 0959  BP: 122/62  Pulse: 66  Resp: 18  Temp: (!) 97.4 F (36.3 C)  SpO2: 100%   Filed Weights   08/16/23 0959  Weight: 238 lb 11.2 oz (108.3 kg)    Physical Exam Constitutional:      General: She is not in acute distress.    Appearance: She is not diaphoretic.  HENT:     Head: Normocephalic and atraumatic.  Eyes:     General: No scleral icterus. Cardiovascular:     Rate and Rhythm: Normal rate and regular rhythm.     Heart sounds: No murmur heard. Pulmonary:     Effort: Pulmonary effort is normal. No respiratory distress.     Breath sounds: No wheezing.  Abdominal:     General: There is no distension.  Palpations: Abdomen is soft.     Tenderness: There is no abdominal tenderness.  Musculoskeletal:        General: Normal range of motion.     Cervical back: Normal range of motion and neck supple.  Skin:    General: Skin is warm and dry.     Findings: No erythema.  Neurological:     Mental Status: She is alert and oriented to person, place, and time.     Cranial Nerves: No cranial nerve deficit.     Motor: No abnormal muscle tone.     Coordination: Coordination normal.  Psychiatric:        Mood and Affect: Affect normal.     Comments: Anxious      Left breast is warm and swollen,  with a palpable, firm mass at the 3 o'clock position,   LABORATORY DATA:  I have reviewed the data as listed    Latest Ref Rng & Units 07/20/2023   10:34 AM 04/28/2023    8:54 AM 12/23/2022    8:53 AM  CBC  WBC  4.0 - 10.5 K/uL 6.6  4.8  6.6   Hemoglobin 12.0 - 15.0 g/dL 89.6  89.8  9.8   Hematocrit 36.0 - 46.0 % 33.2  32.6  31.8   Platelets 150 - 400 K/uL 272  266  231       Latest Ref Rng & Units 07/20/2023   10:34 AM 04/28/2023    8:54 AM 12/23/2022    8:53 AM  CMP  Glucose 70 - 99 mg/dL 858  889  898   BUN 8 - 23 mg/dL 14  14  21    Creatinine 0.44 - 1.00 mg/dL 9.06  9.14  9.02   Sodium 135 - 145 mmol/L 136  137  138   Potassium 3.5 - 5.1 mmol/L 3.6  3.6  3.9   Chloride 98 - 111 mmol/L 104  104  106   CO2 22 - 32 mmol/L 24  25  24    Calcium  8.9 - 10.3 mg/dL 8.6  8.9  8.8   Total Protein 6.5 - 8.1 g/dL 7.1  6.6  6.8   Total Bilirubin 0.0 - 1.2 mg/dL 0.7  0.4  0.3   Alkaline Phos 38 - 126 U/L 99  101  104   AST 15 - 41 U/L 18  20  16    ALT 0 - 44 U/L 16  14  20        RADIOGRAPHIC STUDIES: I have personally reviewed the radiological images as listed and agreed with the findings in the report. US  BREAST ASPIRATION LEFT Result Date: 08/02/2023 CLINICAL DATA:  Patient with history of LEFT breast lumpectomy with radiation chemotherapy in February 2024. No residual disease was noted on lumpectomy and axillary node specimen after neoadjuvant chemotherapy. Patient presents for elective seroma aspiration. Patient has had several aspirations of the seroma. EXAM: ULTRASOUND GUIDED LEFT BREAST CYST ASPIRATION COMPARISON:  Previous exam(s). PROCEDURE: The patient and I discussed the procedure of ultrasound-guided aspiration including benefits and alternatives. We discussed the high likelihood of a successful procedure. We discussed the risks of the procedure including infection, bleeding, tissue injury, and inadequate sampling. Informed written consent was given. The usual time out protocol was performed immediately prior to the procedure. Using sterile technique, 1% lidocaine , under direct ultrasound visualization, needle aspiration of a seroma at 2:30 5 cm from nipple was performed. Approximately 22 ML of  dark red brown fluid was aspirated from the seroma consistent  with a complex seroma. Aspirated fluid has a benign appearance so was discarded. There is residual complex material at the site most consistent with organizing blood products which are not amenable for aspiration. IMPRESSION: Ultrasound-guided aspiration of a complex LEFT breast seroma. No apparent complications. RECOMMENDATIONS: Recommend bilateral diagnostic mammogram (with RIGHT and LEFT breast ultrasound if deemed necessary) in 1 year as per lumpectomy protocol. Recommend clinical management of persistent LEFT breast erythema with consideration of skin punch biopsy if not responsive to conservative measures as outlined on diagnostic report from June twenty-fifth 2025. Electronically Signed   By: Corean Salter M.D.   On: 08/02/2023 10:35   MM 3D DIAGNOSTIC MAMMOGRAM UNILATERAL LEFT BREAST Result Date: 07/28/2023 CLINICAL DATA:  Patient with history of LEFT breast lumpectomy, radiation and chemotherapy. She reports 3 weeks of nonfocal pain throughout the LEFT breast. EXAM: DIGITAL DIAGNOSTIC UNILATERAL LEFT MAMMOGRAM WITH TOMOSYNTHESIS AND CAD; ULTRASOUND LEFT BREAST LIMITED TECHNIQUE: Left digital diagnostic mammography and breast tomosynthesis was performed. The images were evaluated with computer-aided detection. ; Targeted ultrasound examination of the left breast was performed. COMPARISON:  Previous exam(s). ACR Breast Density Category b: There are scattered areas of fibroglandular density. FINDINGS: Postlumpectomy changes of the LEFT breast. Again demonstrated is a large mass at approximately 2-3 o'clock measuring up to approximately 5.5 cm, correlating with known seroma in this location. Surrounding density and architectural distortion in keeping with post lumpectomy change. There is diffuse skin thickening and trabecular coarsening compatible with history of radiation therapy, similar to prior. No mammographic abnormalities in the MEDIAL  breast aside from stable breast skin thickening. Targeted ultrasound of the LEFT breast was performed. The entire MEDIAL LEFT breast was evaluated without underlying sonographic abnormality or etiology for pain. Skin thickening is noted. Representative negative pictures were taken. Physical examination of this area demonstrates mild diffuse erythema. At the LEFT breast 2:30 position 5 cm from the nipple there is again demonstrated a 44 x 30 x 38 mm seroma with internal complex debris, previously measuring 43 x 26 x 44 mm. The complex internal debris is likely the result of multiple reported interval aspirations. IMPRESSION: 1. Diffuse MEDIAL LEFT breast erythema and pain without interval change in mammographic appearance or suspicious sonographic abnormality. Stable skin thickening in this area is presumably due to prior radiation therapy. Findings are most suspicious for cellulitis. Recommend treatment with antibiotics. If antibiotics fail to resolve the erythema, surgical or dermatologic consultation for skin punch biopsy is recommended to exclude inflammatory breast cancer. 2. Redemonstrated LEFT breast seroma at the lumpectomy site, now with complex internal debris. Patient requests aspiration of the fluid component of this collection, which will be attempted under ultrasound guidance. RECOMMENDATION: 1. Antibiotic therapy for suspected LEFT breast cellulitis is recommended. If erythema fails to resolve after antibiotic therapy, surgical or dermatologic consultation for skin punch biopsy is recommended. 2. Elective ultrasound guided aspiration of LEFT breast complex seroma. I have discussed the findings and recommendations with the patient. Dr. Babara was notified via epic secure chat. If applicable, a reminder letter will be sent to the patient regarding the next appointment. BI-RADS CATEGORY  2: Benign. Electronically Signed   By: Norleen Croak M.D.   On: 07/28/2023 14:56   US  LIMITED ULTRASOUND INCLUDING AXILLA  LEFT BREAST  Result Date: 07/28/2023 CLINICAL DATA:  Patient with history of LEFT breast lumpectomy, radiation and chemotherapy. She reports 3 weeks of nonfocal pain throughout the LEFT breast. EXAM: DIGITAL DIAGNOSTIC UNILATERAL LEFT MAMMOGRAM WITH TOMOSYNTHESIS AND CAD; ULTRASOUND LEFT BREAST  LIMITED TECHNIQUE: Left digital diagnostic mammography and breast tomosynthesis was performed. The images were evaluated with computer-aided detection. ; Targeted ultrasound examination of the left breast was performed. COMPARISON:  Previous exam(s). ACR Breast Density Category b: There are scattered areas of fibroglandular density. FINDINGS: Postlumpectomy changes of the LEFT breast. Again demonstrated is a large mass at approximately 2-3 o'clock measuring up to approximately 5.5 cm, correlating with known seroma in this location. Surrounding density and architectural distortion in keeping with post lumpectomy change. There is diffuse skin thickening and trabecular coarsening compatible with history of radiation therapy, similar to prior. No mammographic abnormalities in the MEDIAL breast aside from stable breast skin thickening. Targeted ultrasound of the LEFT breast was performed. The entire MEDIAL LEFT breast was evaluated without underlying sonographic abnormality or etiology for pain. Skin thickening is noted. Representative negative pictures were taken. Physical examination of this area demonstrates mild diffuse erythema. At the LEFT breast 2:30 position 5 cm from the nipple there is again demonstrated a 44 x 30 x 38 mm seroma with internal complex debris, previously measuring 43 x 26 x 44 mm. The complex internal debris is likely the result of multiple reported interval aspirations. IMPRESSION: 1. Diffuse MEDIAL LEFT breast erythema and pain without interval change in mammographic appearance or suspicious sonographic abnormality. Stable skin thickening in this area is presumably due to prior radiation therapy.  Findings are most suspicious for cellulitis. Recommend treatment with antibiotics. If antibiotics fail to resolve the erythema, surgical or dermatologic consultation for skin punch biopsy is recommended to exclude inflammatory breast cancer. 2. Redemonstrated LEFT breast seroma at the lumpectomy site, now with complex internal debris. Patient requests aspiration of the fluid component of this collection, which will be attempted under ultrasound guidance. RECOMMENDATION: 1. Antibiotic therapy for suspected LEFT breast cellulitis is recommended. If erythema fails to resolve after antibiotic therapy, surgical or dermatologic consultation for skin punch biopsy is recommended. 2. Elective ultrasound guided aspiration of LEFT breast complex seroma. I have discussed the findings and recommendations with the patient. Dr. Babara was notified via epic secure chat. If applicable, a reminder letter will be sent to the patient regarding the next appointment. BI-RADS CATEGORY  2: Benign. Electronically Signed   By: Norleen Croak M.D.   On: 07/28/2023 14:56

## 2023-08-16 NOTE — Assessment & Plan Note (Signed)
 Recommend patient to take calcium 1200mg  daily, otc supply.  Take vitamin D supplementation.

## 2023-08-16 NOTE — Assessment & Plan Note (Signed)
 Continue Slow Mag 1 tab BID

## 2023-08-16 NOTE — Assessment & Plan Note (Addendum)
 Left breast cancer, cT4b N3 Mx, ER/PR-, HER2 + S/p neoadjuvant chemotherapy 6 cycles of TCHP  S/p left lumpectomy  + SLNB -ypT0 ypN0, complete pathological response.  S/p adjuvant Transtuzumab and Pertuzumab  11 cycles.  S/p adjuvant radiation-   Sept 2024 echocardiogram- stable LVEF-  Recommend adjuvant Zometa- she declined.  Left breast seroma s/p aspiration.   Today's breast examination showed improved left breast mastitis. Mammogram results were reviewed with patient. I will hold off additional antibiotics for now.  Recommend patient to follow up with  Dr. Cesar evaluation.

## 2023-08-23 ENCOUNTER — Encounter: Payer: Self-pay | Admitting: Emergency Medicine

## 2023-08-23 ENCOUNTER — Ambulatory Visit
Admission: EM | Admit: 2023-08-23 | Discharge: 2023-08-23 | Disposition: A | Discharge status: Home / Self Care | Attending: Family Medicine | Admitting: Family Medicine

## 2023-08-23 DIAGNOSIS — C50912 Malignant neoplasm of unspecified site of left female breast: Secondary | ICD-10-CM | POA: Insufficient documentation

## 2023-08-23 DIAGNOSIS — R300 Dysuria: Secondary | ICD-10-CM | POA: Insufficient documentation

## 2023-08-23 DIAGNOSIS — Z853 Personal history of malignant neoplasm of breast: Secondary | ICD-10-CM | POA: Insufficient documentation

## 2023-08-23 DIAGNOSIS — N61 Mastitis without abscess: Secondary | ICD-10-CM | POA: Diagnosis not present

## 2023-08-23 DIAGNOSIS — Z171 Estrogen receptor negative status [ER-]: Secondary | ICD-10-CM | POA: Diagnosis present

## 2023-08-23 DIAGNOSIS — N6489 Other specified disorders of breast: Secondary | ICD-10-CM | POA: Insufficient documentation

## 2023-08-23 LAB — URINALYSIS, W/ REFLEX TO CULTURE (INFECTION SUSPECTED)
Bilirubin Urine: NEGATIVE
Glucose, UA: NEGATIVE mg/dL
Ketones, ur: NEGATIVE mg/dL
Leukocytes,Ua: NEGATIVE
Nitrite: NEGATIVE
Protein, ur: NEGATIVE mg/dL
Specific Gravity, Urine: 1.025 (ref 1.005–1.030)
pH: 5 (ref 5.0–8.0)

## 2023-08-23 LAB — GLUCOSE, CAPILLARY: Glucose-Capillary: 127 mg/dL — ABNORMAL HIGH (ref 70–99)

## 2023-08-23 MED ORDER — FLUCONAZOLE 150 MG PO TABS: 150.0000 mg | ORAL_TABLET | ORAL | 0 refills | Status: DC

## 2023-08-23 MED ORDER — OXYCODONE HCL 10 MG PO TABA: 10.0000 mg | ORAL_TABLET | ORAL | 0 refills | Status: AC | PRN

## 2023-08-23 MED ORDER — SULFAMETHOXAZOLE-TRIMETHOPRIM 800-160 MG PO TABS: 1.0000 | ORAL_TABLET | Freq: Two times a day (BID) | ORAL | 0 refills | Status: AC

## 2023-08-23 MED ORDER — OXYCODONE HCL ER 10 MG PO T12A: 10.0000 mg | EXTENDED_RELEASE_TABLET | ORAL | 0 refills | Status: DC | PRN

## 2023-08-23 NOTE — ED Triage Notes (Signed)
 Pt presents with an infection her left breast. Pt just finished up her 2nd round of antibiotics 2 days ago. She continues to have drainage and skin peeling on her breast.   She also c/o vaginal irritation, dysuria and back pain x 2 weeks.

## 2023-08-23 NOTE — ED Provider Notes (Incomplete)
 [facility]    CSN: [id number] Arrival date & time: 08/23/23  1314      History   Chief Complaint Chief Complaint  Patient presents with   Breast Problem   Dysuria   Vaginal Irritation     HPI Vanessa Fisher is a 63 y.o. female.   HPI   Vanessa Fisher presents for left breast rash with drainage  that started 6 weeks ago. She has taken 2 rounds of antibiotics and 2 rounds of getting fluid drainage. Says that took 25 ml of fluid out the last time.    Fever : no  Chills: no Sore throat: no   Cough: no Sputum: no Nasal congestion : no  Rhinorrhea: no Myalgias: no Appetite: normal  Hydration: normal  Abdominal pain: no Nausea: no Vomiting: no Sleep disturbance: no Headache: no      Past Medical History:  Diagnosis Date   Anemia    Anxiety    Arthritis    Asthma    WELL CONTROLLED   Bile acid malabsorption syndrome    Bradycardia    HAD AN ISSUE WITH THIS IN 2016-NO PROBLEMS SINCE   Breast cancer, left breast (HCC) 08/2021   Carpal tunnel syndrome on right    Depression    Diabetes mellitus without complication (HCC)    Diverticulitis    Gastric reflux    GERD (gastroesophageal reflux disease)    Hand pain 05/12/2016   Headache    H/O MIGRAINES   History of kidney stones    H/O   Lumbar radiculitis    Neck pain, chronic    Osteoarthritis of both knees    Panic attacks    Personal history of chemotherapy    Personal history of radiation therapy     Patient Active Problem List   Diagnosis Date Noted   Constipation 08/16/2023   Dysuria 07/20/2023   Hypocalcemia 04/28/2023   Port-A-Cath in place 04/28/2023   Arthritis of knee 04/28/2023   Cough 04/28/2023   Right upper quadrant pain 12/02/2022   Anxiety 12/02/2022   Muscle cramps 09/23/2022   Radiation dermatitis 08/14/2022   Right shoulder pain 06/26/2022   Back pain 06/26/2022   Allergy 06/26/2022   Pollen allergy 04/24/2022   Insomnia 02/27/2022   Anxiety associated with  cancer diagnosis (HCC) 02/27/2022   Chronic sinusitis 12/17/2021   Sinus headache 11/26/2021   Financial difficulties 11/26/2021   Palmar plantar erythrodysesthesia 11/19/2021   Genetic testing 10/30/2021   Hypomagnesemia 10/28/2021   Normocytic anemia 10/07/2021   Chemotherapy induced diarrhea 10/07/2021   Thrombophlebitis 10/07/2021   Encounter for antineoplastic chemotherapy 09/12/2021   Encounter for monitoring cardiotoxic drug therapy 08/28/2021   Goals of care, counseling/discussion 07/26/2021   Breast cancer (HCC) 07/18/2021   Carpal tunnel syndrome of right wrist 12/06/2017   Radial styloid tenosynovitis 08/11/2017   Lumbar facet osteoarthritis 11/10/2016   Chronic musculoskeletal pain 11/05/2016   Lumbar radiculitis (Right) (L4) 09/28/2016   Vitamin D  insufficiency 07/30/2016   Chronic pain syndrome 07/08/2016   Long term (current) use of opiate analgesic 07/08/2016   Long term prescription opiate use 07/08/2016   Opiate use (30 MME/day) 07/08/2016   Chronic low back pain (Primary Source of Pain) (midline) ( (Bilateral) (L>R) 07/08/2016   Disturbance of skin sensation 07/08/2016   Chronic neck pain (Secondary source of pain) (midline) (Bilateral) (L>R) 07/08/2016   DDD (degenerative disc disease), cervical 07/08/2016   DDD (degenerative disc disease), lumbar 07/08/2016   Cervical spondylosis 07/08/2016  Cervical radicular pain (Right) (C7/C8) 07/08/2016   Upper extremity numbness (Right) 07/08/2016   Cervical facet syndrome (Bilateral) (L>R) 07/08/2016   Osteoarthritis of knee (Bilateral) (R>L) 07/08/2016   Chronic lower extremity pain (Right) 07/08/2016   Lumbar facet syndrome (Bilateral) (L>R) 07/08/2016   Grade 1 Anterolisthesis of L4 over L5 (3 mm) 07/08/2016   Grade 1 Retrolisthesis of L5 over S1 (5 mm) 07/08/2016   Chronic knee pain [facility] source of pain) (Bilateral) (R>L) 05/12/2016    Past Surgical History:  Procedure Laterality Date   BREAST BIOPSY  Left 08/21/2021   Axilla Bx, Hydromarker, neg   BREAST BIOPSY Left 08/21/2021   US  Bx, Ribbon Clip, invasive mammary carcinoma   BREAST BIOPSY Left 03/12/2022   US  LT RADIO FREQUENCY TAG LOC US  GUIDE 03/12/2022 [facility]   BREAST BIOPSY Left 03/12/2022   US  LT RADIO FREQUENCY TAG EA ADD LESION LOC US  GUIDE 03/12/2022 [facility]   BREAST LUMPECTOMY Left 03/27/2022   CARPAL TUNNEL RELEASE Right 12/06/2017   Procedure: CARPAL TUNNEL RELEASE;  Surgeon: Cleotilde Barrio, MD;  Location: [facility];  Service: Orthopedics;  Laterality: Right;   COLONOSCOPY WITH PROPOFOL  N/A 06/11/2021   Procedure: COLONOSCOPY WITH PROPOFOL ;  Surgeon: Unk Corinn Skiff, MD;  Location: [facility];  Service: Gastroenterology;  Laterality: N/A;   DORSAL COMPARTMENT RELEASE Right 12/06/2017   Procedure: RELEASE DORSAL COMPARTMENT (DEQUERVAIN);  Surgeon: Cleotilde Barrio, MD;  Location: [facility];  Service: Orthopedics;  Laterality: Right;   ESOPHAGOGASTRODUODENOSCOPY (EGD) WITH PROPOFOL  N/A 06/11/2021   Procedure: ESOPHAGOGASTRODUODENOSCOPY (EGD) WITH PROPOFOL ;  Surgeon: Unk Corinn Skiff, MD;  Location: [facility] ENDOSCOPY;  Service: Gastroenterology;  Laterality: N/A;   PART MASTECTOMY,RADIO FREQUENCY LOCALIZER,AXILLARY SENTINEL NODE BIOPSY Left 03/27/2022   Procedure: PART MASTECTOMY,RADIO FREQUENCY LOCALIZER,AXILLARY SENTINEL NODE BIOPSY;  Surgeon: Rodolph Romano, MD;  Location: [facility];  Service: General;  Laterality: Left;   PARTIAL HYSTERECTOMY     PORTACATH PLACEMENT N/A 09/24/2021   Procedure: INSERTION PORT-A-CATH;  Surgeon: Rodolph Romano, MD;  Location: [facility];  Service: General;  Laterality: N/A;   TUBAL LIGATION      OB History   No obstetric history on file.      Home Medications    Prior to Admission medications   Medication Sig Start Date End Date Taking? Authorizing Provider  pantoprazole (PROTONIX) 20 MG tablet Take 20 mg by mouth daily. 08/09/23  Yes [provider]  pregabalin (LYRICA) 50 MG capsule Take 50 mg by mouth 3 (three) times daily. 08/09/23  Yes [provider]  albuterol  (PROVENTIL ) (2.5 MG/3ML) 0.083% nebulizer solution Take 3 mLs (2.5 mg total) by nebulization every 4 (four) hours as needed for wheezing or shortness of breath. 10/09/20 08/16/23  Fisher, Devere ORN, PA-C  albuterol  (VENTOLIN  HFA) 108 (90 Base) MCG/ACT inhaler Inhale 2 puffs into the lungs every 6 (six) hours as needed for wheezing or shortness of breath. 10/06/20   Rodriguez-Southworth, Sylvia, PA-C  allopurinol (ZYLOPRIM) 100 MG tablet Take 100 mg by mouth daily. 07/08/23   [provider]  atorvastatin (LIPITOR) 10 MG tablet Take 10 mg by mouth daily.    [provider]  Azelastine  HCl 137 MCG/SPRAY SOLN Place 1 spray into the nose daily. 02/11/22   [name], MD  calcium  carbonate (OS-CAL - DOSED IN MG OF ELEMENTAL CALCIUM ) 1250 (500 Ca) MG tablet Take 1 tablet (1,250 mg total) by mouth daily. 06/05/22   Yu, Zhou, MD  celecoxib (CELEBREX) 200 MG capsule Take by mouth 2 (two) times daily. 07/14/23  [provider]  cetirizine  (ZYRTEC  ALLERGY) 10 MG tablet Take 1 tablet (10 mg total) by mouth daily. 11/26/21   [name], MD  cyclobenzaprine  (FLEXERIL ) 10 MG tablet Take 10 mg by mouth 3 (three) times daily. 10/12/22   [provider]  diclofenac Sodium (VOLTAREN) 1 % GEL Apply topically. 04/12/23   [provider]  diphenoxylate -atropine  (LOMOTIL ) 2.5-0.025 MG tablet Take 1 tablet by mouth 4 (four) times daily as needed for diarrhea or loose stools. 09/23/22   [name], MD  docusate sodium  (COLACE) 100 MG capsule Take 1 capsule (100 mg total) by mouth 2 (two) times daily. 08/16/23   [name], MD  escitalopram  (LEXAPRO ) 10 MG tablet Take 1 tablet (10 mg total) by mouth daily. 06/26/22   [name], MD  esomeprazole  (NEXIUM ) 40 MG capsule Take 1 capsule (40 mg total) by mouth daily at 12 noon. 01/27/22   Tonette Lauraine HERO, PA-C  etodolac  (LODINE ) 500  MG tablet Take 1 tablet by mouth 2 (two) times daily. 04/26/23   [provider]  FLOVENT  DISKUS 250 MCG/ACT AEPB Inhale 1 puff into the lungs daily. 05/19/21   [provider]  fluticasone  (FLONASE ) 50 MCG/ACT nasal spray Place 2 sprays into both nostrils daily. 05/04/20   Gwenn Kent, MD  Iron -Vitamin C  65-125 MG TABS Take 1 tablet by mouth daily at 2 PM. 04/27/22   [name], MD  KLOR-CON  M20 20 MEQ tablet TAKE 1 TABLET BY MOUTH TWICE A DAY 05/20/22   [name], MD  lidocaine -prilocaine  (EMLA ) cream Apply 1 Application topically as needed. 09/11/21   [name], MD  magnesium  chloride (SLOW-MAG) 64 MG TBEC SR tablet Take 1 tablet (64 mg total) by mouth 2 (two) times daily. 04/28/23   [name], MD  montelukast  (SINGULAIR ) 10 MG tablet Take by mouth. 02/10/21   [provider]  naloxone [facility]) nasal spray 4 mg/0.1 mL  02/09/21   [provider]  oxyCODONE -acetaminophen  (PERCOCET) 5-325 MG tablet Take 1 tablet by mouth every 8 (eight) hours as needed for severe pain (pain score 7-10). 02/01/23 02/01/24  Arlander Charleston, MD  sulfamethoxazole -trimethoprim  (BACTRIM  DS) 800-160 MG tablet Take 1 tablet by mouth 2 (two) times daily. 07/28/23   [name], MD  zinc gluconate 50 MG tablet Take 50 mg by mouth as needed.    [provider]  gabapentin  (NEURONTIN ) 400 MG capsule Take 1 capsule (400 mg total) by mouth 2 (two) times daily. 12/06/17 07/27/18  Cleotilde Barrio, MD    Family History Family History  Problem Relation Age of Onset   Diabetes Mother    Hypertension Mother    Heart failure Mother    Pancreatic cancer Mother 78   Diabetes Father    Hypertension Father    Breast cancer Maternal Aunt    Cervical cancer Maternal Aunt    Lung cancer Son 88    Social History Social History   Tobacco Use   Smoking status: Former    Types: Cigarettes   Smokeless tobacco: Never  Vaping Use   Vaping status: Never Used  Substance Use Topics   Alcohol use: No   Drug  use: No     Allergies   Bc powder [aspirin-salicylamide-caffeine], Doxycycline , Gabapentin , Hydrocodone -acetaminophen , Latex, Penicillin g, Penicillins, and Shellfish allergy   Review of Systems Review of Systems: negative unless otherwise stated in HPI.      Physical Exam Triage Vital Signs ED Triage Vitals  Encounter Vitals Group     BP  08/23/23 1340 (!) 115/90     Girls Systolic BP Percentile --      Girls Diastolic BP Percentile --      Boys Systolic BP Percentile --      Boys Diastolic BP Percentile --      Pulse Rate 08/23/23 1340 63     Resp 08/23/23 1340 18     Temp 08/23/23 1340 (!) 97.5 F (36.4 C)     Temp Source 08/23/23 1340 Tympanic     SpO2 08/23/23 1340 99 %     Weight --      Height --      Head Circumference --      Peak Flow --      Pain Score 08/23/23 1337 7     Pain Loc --      Pain Education --      Exclude from Growth Chart --    No data found.  Updated Vital Signs BP (!) 115/90 (BP Location: Right Arm)   Pulse 63   Temp (!) 97.5 F (36.4 C) (Tympanic)   Resp 18   SpO2 99%   Visual Acuity Right Eye Distance:   Left Eye Distance:   Bilateral Distance:    Right Eye Near:   Left Eye Near:    Bilateral Near:     Physical Exam GEN:     alert, cooperative and no distress ***   HENT:  mucus membranes moist, oropharyngeal without lesions or erythema ***,  nares patent, no nasal discharge *** EYES:   pupils equal and reactive, EOM intact NECK:  supple, normal ROM, no lymphadenopathy *** RESP:  clear to auscultation bilaterally, no increased work of breathing *** CVS:   regular rate and rhythm, no murmur, distal pulses intact  *** ABD:  soft, non-tender; bowel sounds present; no palpable masses,  *** EXT:   normal ROM, atraumatic, no edema *** NEURO:  normal without focal findings,  speech normal, alert and oriented  *** Skin:   warm and dry, no rash***, normal*** skin turgor Psych: Normal affect, appropriate speech and behavior       UC Treatments / Results  Labs (all labs ordered are listed, but only abnormal results are displayed) Labs Reviewed  URINALYSIS, W/ REFLEX TO CULTURE (INFECTION SUSPECTED) - Abnormal; Notable for the following components:      Result Value   Hgb urine dipstick SMALL (*)    Bacteria, UA FEW (*)    All other components within normal limits    EKG  If EKG performed, see my interpretation in the MDM section  Radiology No results found.   Procedures Procedures (including critical care time)  Medications Ordered in UC Medications - No data to display  Initial Impression / Assessment and Plan / UC Course  I have reviewed the triage vital signs and the nursing notes.  Pertinent labs & imaging results that were available during my care of the patient were reviewed by me and considered in my medical decision making (see chart for details).       Patient is a 63 y.o. female  who presents for ***.  Overall patient is nontoxic-appearing and ***afebrile.  Vital signs stable.   ED and return precautions given and patient/guardian voiced understanding. Discussed MDM, treatment plan and plan for follow-up with patient*** who agrees with plan.     Discussed MDM, treatment plan and plan for follow-up with patient***who agrees with plan.   Final Clinical Impressions(s) / UC Diagnoses   Final  diagnoses:  None     Discharge Instructions      Recommend a skin punch biopsy is recommended to exclude inflammatory breast cancer, follow up with your oncologist or primary care provider.    ED Prescriptions   None    PDMP not reviewed this encounter.

## 2023-08-23 NOTE — Discharge Instructions (Addendum)
 Recommend a skin punch biopsy is recommended to exclude inflammatory breast cancer, follow up with your oncologist or primary care provider.   Stop by the pharmacy to pick up your prescriptions.

## 2023-08-24 ENCOUNTER — Ambulatory Visit: Payer: Self-pay | Admitting: General Surgery
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
## 2023-08-25 ENCOUNTER — Other Ambulatory Visit: Payer: Self-pay

## 2023-08-25 ENCOUNTER — Encounter
Admission: RE | Admit: 2023-08-25 | Discharge: 2023-08-25 | Disposition: A | Discharge status: Home / Self Care | Source: Ambulatory Visit | Attending: General Surgery | Admitting: General Surgery

## 2023-08-25 VITALS — Ht 66.0 in | Wt 240.0 lb

## 2023-08-25 DIAGNOSIS — Z8639 Personal history of other endocrine, nutritional and metabolic disease: Secondary | ICD-10-CM

## 2023-08-25 NOTE — Patient Instructions (Addendum)
 Your procedure is scheduled on: Friday 08/27/23 Report to the Registration Desk on the 1st floor of the [facility]. To find out your arrival time, please call [phone] between 1PM - 3PM on: Thursday 08/26/23 If your arrival time is 6:00 am, do not arrive before that time as the [facility] entrance doors do not open until 6:00 am.  REMEMBER: Instructions that are not followed completely may result in serious medical risk, up to and including death; or upon the discretion of your surgeon and anesthesiologist your surgery may need to be rescheduled.  Do not eat food or drink any liquids after midnight the night before surgery.  No gum chewing or hard candies.  One week prior to surgery: Stop Anti-inflammatories (NSAIDS) such as Advil , Aleve, Ibuprofen , Motrin , Naproxen, Naprosyn and Aspirin based products such as Excedrin, Goody's Powder, BC Powder.  You may however, continue to take Tylenol  if needed for pain up until the day of surgery.  Stop ALL OVER THE COUNTER supplements and vitamins until after surgery.  Continue taking all of your other prescription medications up until the day of surgery.  ON THE DAY OF SURGERY ONLY TAKE THESE MEDICATIONS WITH SIPS OF WATER :  pantoprazole (PROTONIX) 20 MG tablet   Use your nebulizer on the day of surgery and bring your inhaler to the hospital.  No Alcohol for 24 hours before or after surgery.  No Smoking including e-cigarettes for 24 hours before surgery.  No chewable tobacco products for at least 6 hours before surgery.  No nicotine patches on the day of surgery.  Do not use any recreational drugs for at least a week (preferably 2 weeks) before your surgery.  Please be advised that the combination of cocaine and anesthesia may have negative outcomes, up to and including death. If you test positive for cocaine, your surgery will be cancelled.  On the morning of surgery brush your teeth with toothpaste and water , you may rinse your  mouth with mouthwash if you wish. Do not swallow any toothpaste or mouthwash.  Use CHG Soap or wipes as directed on instruction sheet.  Do not wear jewelry, make-up, hairpins, clips or nail polish.  For welded (permanent) jewelry: bracelets, anklets, waist bands, etc.  Please have this removed prior to surgery.  If it is not removed, there is a chance that hospital personnel will need to cut it off on the day of surgery.  Do not wear lotions, powders, perfumes or deodorant.   Do not shave body hair from the neck down 48 hours before surgery.  Contact lenses, hearing aids and dentures may not be worn into surgery.  Do not bring valuables to the hospital. [facility] is not responsible for any missing/lost belongings or valuables.   Notify your doctor if there is any change in your medical condition (cold, fever, infection).  Wear comfortable clothing (specific to your surgery type) to the hospital.  After surgery, you can help prevent lung complications by doing breathing exercises.  Take deep breaths and cough every 1-2 hours. Your doctor may order a device called an Incentive Spirometer to help you take deep breaths.  If you are being discharged the day of surgery, you will not be allowed to drive home. You will need a responsible individual to drive you home and stay with you for 24 hours after surgery.   Please call the Pre-admissions Testing Dept. at [phone] if you have any questions about these instructions.  Surgery Visitation Policy:  Patients having  surgery or a procedure may have two visitors.  Children under the age of 69 must have an adult with them who is not the patient.  Merchandiser, retail to address health-related social needs:  [email or web address]     Preparing for Surgery with CHLORHEXIDINE  GLUCONATE (CHG) Soap  Chlorhexidine  Gluconate (CHG) Soap  o An antiseptic cleaner that kills germs and bonds with the skin to continue  killing germs even after washing  o Used for showering the night before surgery and morning of surgery  Before surgery, you can play an important role by reducing the number of germs on your skin.  CHG (Chlorhexidine  gluconate) soap is an antiseptic cleanser which kills germs and bonds with the skin to continue killing germs even after washing.  Please do not use if you have an allergy to CHG or antibacterial soaps. If your skin becomes reddened/irritated stop using the CHG.  1. Shower the NIGHT BEFORE SURGERY and the MORNING OF SURGERY with CHG soap.  2. If you choose to wash your hair, wash your hair first as usual with your normal shampoo.  3. After shampooing, rinse your hair and body thoroughly to remove the shampoo.  4. Use CHG as you would any other liquid soap. You can apply CHG directly to the skin and wash gently with a scrungie or a clean washcloth.  5. Apply the CHG soap to your body only from the neck down. Do not use on open wounds or open sores. Avoid contact with your eyes, ears, mouth, and genitals (private parts). Wash face and genitals (private parts) with your normal soap.  6. Wash thoroughly, paying special attention to the area where your surgery will be performed.  7. Thoroughly rinse your body with warm water .  8. Do not shower/wash with your normal soap after using and rinsing off the CHG soap.  9. Pat yourself dry with a clean towel.  10. Wear clean pajamas to bed the night before surgery.  12. Place clean sheets on your bed the night of your first shower and do not sleep with pets.  13. Shower again with the CHG soap on the day of surgery prior to arriving at the hospital.  14. Do not apply any deodorants/lotions/powders.  15. Please wear clean clothes to the hospital.

## 2023-08-27 ENCOUNTER — Encounter
Admission: RE | Disposition: A | Discharge status: Home / Self Care | Payer: Self-pay | Source: Home / Self Care | Attending: General Surgery

## 2023-08-27 ENCOUNTER — Other Ambulatory Visit: Payer: Self-pay

## 2023-08-27 ENCOUNTER — Ambulatory Visit: Admitting: Anesthesiology

## 2023-08-27 ENCOUNTER — Encounter: Payer: Self-pay | Admitting: General Surgery

## 2023-08-27 ENCOUNTER — Ambulatory Visit
Admission: RE | Admit: 2023-08-27 | Discharge: 2023-08-27 | Disposition: A | Discharge status: Home / Self Care | Attending: General Surgery | Admitting: General Surgery

## 2023-08-27 DIAGNOSIS — Z9221 Personal history of antineoplastic chemotherapy: Secondary | ICD-10-CM | POA: Insufficient documentation

## 2023-08-27 DIAGNOSIS — Z7984 Long term (current) use of oral hypoglycemic drugs: Secondary | ICD-10-CM | POA: Insufficient documentation

## 2023-08-27 DIAGNOSIS — E669 Obesity, unspecified: Secondary | ICD-10-CM | POA: Diagnosis not present

## 2023-08-27 DIAGNOSIS — K219 Gastro-esophageal reflux disease without esophagitis: Secondary | ICD-10-CM | POA: Insufficient documentation

## 2023-08-27 DIAGNOSIS — Z87891 Personal history of nicotine dependence: Secondary | ICD-10-CM | POA: Diagnosis not present

## 2023-08-27 DIAGNOSIS — J45909 Unspecified asthma, uncomplicated: Secondary | ICD-10-CM | POA: Insufficient documentation

## 2023-08-27 DIAGNOSIS — Z7985 Long-term (current) use of injectable non-insulin antidiabetic drugs: Secondary | ICD-10-CM | POA: Insufficient documentation

## 2023-08-27 DIAGNOSIS — Z6838 Body mass index (BMI) 38.0-38.9, adult: Secondary | ICD-10-CM | POA: Diagnosis not present

## 2023-08-27 DIAGNOSIS — G8929 Other chronic pain: Secondary | ICD-10-CM | POA: Insufficient documentation

## 2023-08-27 DIAGNOSIS — C50212 Malignant neoplasm of upper-inner quadrant of left female breast: Secondary | ICD-10-CM | POA: Insufficient documentation

## 2023-08-27 DIAGNOSIS — Z923 Personal history of irradiation: Secondary | ICD-10-CM | POA: Insufficient documentation

## 2023-08-27 DIAGNOSIS — N611 Abscess of the breast and nipple: Secondary | ICD-10-CM | POA: Diagnosis present

## 2023-08-27 DIAGNOSIS — E119 Type 2 diabetes mellitus without complications: Secondary | ICD-10-CM | POA: Insufficient documentation

## 2023-08-27 DIAGNOSIS — Z8639 Personal history of other endocrine, nutritional and metabolic disease: Secondary | ICD-10-CM

## 2023-08-27 HISTORY — PX: INCISION AND DRAINAGE, ABSCESS, BREAST: SHX7594

## 2023-08-27 LAB — POCT I-STAT, CHEM 8
BUN: 13 mg/dL (ref 8–23)
Calcium, Ion: 1.21 mmol/L (ref 1.15–1.40)
Chloride: 104 mmol/L (ref 98–111)
Creatinine, Ser: 0.9 mg/dL (ref 0.44–1.00)
Glucose, Bld: 117 mg/dL — ABNORMAL HIGH (ref 70–99)
HCT: 34 % — ABNORMAL LOW (ref 36.0–46.0)
Hemoglobin: 11.6 g/dL — ABNORMAL LOW (ref 12.0–15.0)
Potassium: 4 mmol/L (ref 3.5–5.1)
Sodium: 138 mmol/L (ref 135–145)
TCO2: 22 mmol/L (ref 22–32)

## 2023-08-27 LAB — GLUCOSE, CAPILLARY: Glucose-Capillary: 99 mg/dL (ref 70–99)

## 2023-08-27 SURGERY — INCISION AND DRAINAGE, ABSCESS, BREAST
Anesthesia: General | Site: Breast | Laterality: Left

## 2023-08-27 MED ORDER — LIDOCAINE HCL (CARDIAC) PF 100 MG/5ML IV SOSY
PREFILLED_SYRINGE | INTRAVENOUS | Status: DC | PRN
  Administered 2023-08-27: 100 mg via INTRAVENOUS

## 2023-08-27 MED ORDER — LACTATED RINGERS IV SOLN: INTRAVENOUS | Status: DC | PRN

## 2023-08-27 MED ORDER — ORAL CARE MOUTH RINSE: 15.0000 mL | Freq: Once | OROMUCOSAL | Status: AC

## 2023-08-27 MED ORDER — BUPIVACAINE-EPINEPHRINE 0.25% -1:200000 IJ SOLN
INTRAMUSCULAR | Status: DC | PRN
Start: 2023-08-27 — End: 2023-08-27
  Administered 2023-08-27: 14 mL

## 2023-08-27 MED ORDER — PROPOFOL 10 MG/ML IV BOLUS
INTRAVENOUS | Status: AC
Start: 2023-08-27 — End: 2023-08-27
  Filled 2023-08-27: qty 20

## 2023-08-27 MED ORDER — FENTANYL CITRATE (PF) 100 MCG/2ML IJ SOLN
INTRAMUSCULAR | Status: AC
Start: 2023-08-27 — End: 2023-08-27
  Filled 2023-08-27: qty 2

## 2023-08-27 MED ORDER — FENTANYL CITRATE (PF) 100 MCG/2ML IJ SOLN
25.0000 ug | INTRAMUSCULAR | Status: DC | PRN
  Administered 2023-08-27 (×4): 25 ug via INTRAVENOUS

## 2023-08-27 MED ORDER — ACETAMINOPHEN 10 MG/ML IV SOLN
INTRAVENOUS | Status: DC | PRN
  Administered 2023-08-27: 1000 mg via INTRAVENOUS

## 2023-08-27 MED ORDER — LIDOCAINE HCL (PF) 2 % IJ SOLN
INTRAMUSCULAR | Status: AC
  Filled 2023-08-27: qty 5

## 2023-08-27 MED ORDER — CHLORHEXIDINE GLUCONATE 0.12 % MT SOLN
15.0000 mL | Freq: Once | OROMUCOSAL | Status: AC
  Administered 2023-08-27: 15 mL via OROMUCOSAL

## 2023-08-27 MED ORDER — OXYCODONE HCL 5 MG/5ML PO SOLN: 5.0000 mg | Freq: Once | ORAL | Status: AC | PRN

## 2023-08-27 MED ORDER — ONDANSETRON HCL 4 MG/2ML IJ SOLN: 4.0000 mg | Freq: Once | INTRAMUSCULAR | Status: DC | PRN

## 2023-08-27 MED ORDER — CEFAZOLIN SODIUM-DEXTROSE 2-4 GM/100ML-% IV SOLN
INTRAVENOUS | Status: AC
  Filled 2023-08-27: qty 100

## 2023-08-27 MED ORDER — CEFAZOLIN SODIUM-DEXTROSE 2-4 GM/100ML-% IV SOLN
2.0000 g | INTRAVENOUS | Status: AC
  Administered 2023-08-27: 2 g via INTRAVENOUS

## 2023-08-27 MED ORDER — PROPOFOL 10 MG/ML IV BOLUS
INTRAVENOUS | Status: DC | PRN
  Administered 2023-08-27: 20 mg via INTRAVENOUS
  Administered 2023-08-27: 130 mg via INTRAVENOUS

## 2023-08-27 MED ORDER — OXYCODONE HCL 5 MG PO TABS
ORAL_TABLET | ORAL | Status: AC
  Filled 2023-08-27: qty 1

## 2023-08-27 MED ORDER — FENTANYL CITRATE (PF) 100 MCG/2ML IJ SOLN
INTRAMUSCULAR | Status: DC | PRN
  Administered 2023-08-27: 25 ug via INTRAVENOUS
  Administered 2023-08-27: 75 ug via INTRAVENOUS

## 2023-08-27 MED ORDER — OXYCODONE HCL 5 MG PO TABS
5.0000 mg | ORAL_TABLET | Freq: Once | ORAL | Status: AC | PRN
  Administered 2023-08-27: 5 mg via ORAL

## 2023-08-27 MED ORDER — ACETAMINOPHEN 10 MG/ML IV SOLN
INTRAVENOUS | Status: AC
  Filled 2023-08-27: qty 100

## 2023-08-27 MED ORDER — BUPIVACAINE-EPINEPHRINE (PF) 0.25% -1:200000 IJ SOLN
INTRAMUSCULAR | Status: AC
  Filled 2023-08-27: qty 30

## 2023-08-27 MED ORDER — ACETAMINOPHEN 10 MG/ML IV SOLN
1000.0000 mg | Freq: Once | INTRAVENOUS | Status: DC | PRN
Start: 2023-08-27 — End: 2023-08-27

## 2023-08-27 MED ORDER — MIDAZOLAM HCL 2 MG/2ML IJ SOLN
INTRAMUSCULAR | Status: AC
  Filled 2023-08-27: qty 2

## 2023-08-27 MED ORDER — 0.9 % SODIUM CHLORIDE (POUR BTL) OPTIME
TOPICAL | Status: DC | PRN
  Administered 2023-08-27: 500 mL

## 2023-08-27 MED ORDER — CHLORHEXIDINE GLUCONATE 0.12 % MT SOLN
OROMUCOSAL | Status: AC
Start: 2023-08-27 — End: 2023-08-27
  Filled 2023-08-27: qty 15

## 2023-08-27 MED ORDER — HYDROMORPHONE HCL 1 MG/ML IJ SOLN
INTRAMUSCULAR | Status: DC | PRN
  Administered 2023-08-27: .5 mg via INTRAVENOUS

## 2023-08-27 MED ORDER — MIDAZOLAM HCL 2 MG/2ML IJ SOLN
INTRAMUSCULAR | Status: DC | PRN
  Administered 2023-08-27: 2 mg via INTRAVENOUS

## 2023-08-27 MED ORDER — SODIUM CHLORIDE 0.9 % IV SOLN: INTRAVENOUS | Status: DC

## 2023-08-27 MED ORDER — LACTATED RINGERS IV SOLN: INTRAVENOUS | Status: DC

## 2023-08-27 MED ORDER — ONDANSETRON HCL 4 MG/2ML IJ SOLN
INTRAMUSCULAR | Status: DC | PRN
  Administered 2023-08-27: 4 mg via INTRAVENOUS

## 2023-08-27 SURGICAL SUPPLY — 31 items
BLADE CLIPPER SURG (BLADE) ×1 IMPLANT
BNDG GAUZE DERMACEA FLUFF 4 (GAUZE/BANDAGES/DRESSINGS) IMPLANT
CHLORAPREP W/TINT 26 (MISCELLANEOUS) IMPLANT
CNTNR URN SCR LID CUP LEK RST (MISCELLANEOUS) IMPLANT
DERMABOND ADVANCED .7 DNX12 (GAUZE/BANDAGES/DRESSINGS) IMPLANT
DRAIN PENROSE 12X.25 LTX STRL (MISCELLANEOUS) IMPLANT
DRAIN PENROSE 5/8X18 LTX STRL (DRAIN) IMPLANT
DRAPE LAPAROTOMY 77X122 PED (DRAPES) ×1 IMPLANT
ELECTRODE REM PT RTRN 9FT ADLT (ELECTROSURGICAL) ×1 IMPLANT
GAUZE 4X4 16PLY ~~LOC~~+RFID DBL (SPONGE) IMPLANT
GAUZE SPONGE 4X4 12PLY STRL (GAUZE/BANDAGES/DRESSINGS) IMPLANT
GLOVE BIO SURGEON STRL SZ 6.5 (GLOVE) ×1 IMPLANT
GLOVE BIOGEL PI IND STRL 6.5 (GLOVE) ×1 IMPLANT
GLOVE SURG SYN 6.5 PF PI (GLOVE) ×2 IMPLANT
GOWN STRL REUS W/ TWL LRG LVL3 (GOWN DISPOSABLE) ×3 IMPLANT
KIT TURNOVER KIT A (KITS) ×1 IMPLANT
MANIFOLD NEPTUNE II (INSTRUMENTS) ×1 IMPLANT
NDL HYPO 22X1.5 SAFETY MO (MISCELLANEOUS) ×1 IMPLANT
NEEDLE HYPO 22X1.5 SAFETY MO (MISCELLANEOUS) ×1 IMPLANT
NS IRRIG 1000ML POUR BTL (IV SOLUTION) ×1 IMPLANT
PACK BASIN MINOR ARMC (MISCELLANEOUS) ×1 IMPLANT
PAD ABD DERMACEA PRESS 5X9 (GAUZE/BANDAGES/DRESSINGS) IMPLANT
PUNCH BIOPSY DISP 4 (MISCELLANEOUS) IMPLANT
SOLUTION PREP PVP 2OZ (MISCELLANEOUS) IMPLANT
SPONGE T-LAP 18X18 ~~LOC~~+RFID (SPONGE) ×1 IMPLANT
SUTURE EHLN 3-0 FS-10 30 BLK (SUTURE) IMPLANT
SWAB CULTURE AMIES ANAERIB BLU (MISCELLANEOUS) IMPLANT
SYR 10ML LL (SYRINGE) ×1 IMPLANT
SYR BULB IRRIG 60ML STRL (SYRINGE) ×1 IMPLANT
TRAP FLUID SMOKE EVACUATOR (MISCELLANEOUS) ×1 IMPLANT
WATER STERILE IRR 500ML POUR (IV SOLUTION) ×1 IMPLANT

## 2023-08-27 NOTE — Interval H&P Note (Signed)
 History and Physical Interval Note:  08/27/2023 11:04 AM  Vanessa Fisher  has presented today for surgery, with the diagnosis of N61.1 abscess of breast.  The various methods of treatment have been discussed with the patient and family. After consideration of risks, benefits and other options for treatment, the patient has consented to  Procedure(s): INCISION AND DRAINAGE, ABSCESS, BREAST (Left) as a surgical intervention.  The patient's history has been reviewed, patient examined, no change in status, stable for surgery.  I have reviewed the patient's chart and labs.  Questions were answered to the patient's satisfaction.     Lucas Sjogren

## 2023-08-27 NOTE — Transfer of Care (Signed)
 Immediate Anesthesia Transfer of Care Note  Patient: [name]  Procedure(s) Performed: INCISION AND DRAINAGE, ABSCESS, BREAST (Left: Breast)  Patient Location: PACU  Anesthesia Type:General  Level of Consciousness: sedated  Airway & Oxygen Therapy: Patient Spontanous Breathing and Patient connected to face mask  Post-op Assessment: Report given to RN and Post -op Vital signs reviewed and stable  Post vital signs: Reviewed and stable  Last Vitals:  Vitals Value Taken Time  BP 127/81 08/27/23 12:30  Temp 36.1 C 08/27/23 12:29  Pulse 78 08/27/23 12:33  Resp 17 08/27/23 12:33  SpO2 100 % 08/27/23 12:33  Vitals shown include unfiled device data.  Last Pain:  Vitals:   08/27/23 1229  PainSc: Asleep         Complications: No notable events documented.

## 2023-08-27 NOTE — Op Note (Signed)
 Preoperative diagnosis: Left breast mastitis with abscess   Postoperative diagnosis: Left breast mastitis with abscess  Procedure: Mastotomy with drainage of deep abscess of the left breast Punch biopsy of left breast x 2  Anesthesia: General  Surgeon: Dr. Rodolph, MD  Wound Classification: Contaminated  Indications: Patient is a 63 y.o. female  presented with an abscess on the left breast not resolving with multiple aspirations.  Punch biopsy of the thickened skin indicated to rule out malignancy  Findings:  1. Abscess on the left breast 2.  Abundant amount of turbid fluid seroma drained 3. Adequate hemostasis.   Description of procedure: The patient was placed in the supine position and general anesthesia was induced. The left breast was prepped and draped in the usual sterile fashion. A timeout was completed verifying correct patient, procedure, site, positioning, and implant(s) and/or special equipment prior to beginning this procedure.  A skin incision was made using the previous scar.  Dissection was taken down deep in the left breast.  Chronic cavity seroma was opened and turbid serous fluid was drained. With a hemostat blunt dissection of septas was done to be able to drain the abscess completely. The cavity was irrigated with saline until clear saline was aspirated. The wound was packed with an wet-to-dry packing and approximated with 2 interrupted suture of 3-0 nylon.  2 punch needle biopsies were taken with a 4 mm punch biopsy at the 11 o'clock position of the left breast areola. Sterile dressing left in place. Local anesthesia infiltrated away of the infected area to block the area.  The patient tolerated the procedure well and was taken to the postanesthesia care unit in satisfactory condition.   Specimen: Left breast abscess cultures                    Left breast punch biopsy at the 11 o'clock position  Complications: None  Estimated Blood Loss: 5 mL

## 2023-08-27 NOTE — Anesthesia Procedure Notes (Signed)
 Procedure Name: LMA Insertion Date/Time: 08/27/2023 11:35 AM  Performed by: Veronica Alm BROCKS, CRNAPre-anesthesia Checklist: Patient identified, Emergency Drugs available, Suction available and Patient being monitored Patient Re-evaluated:Patient Re-evaluated prior to induction Oxygen Delivery Method: Circle system utilized Preoxygenation: Pre-oxygenation with 100% oxygen Induction Type: IV induction Ventilation: Mask ventilation without difficulty LMA: LMA inserted LMA Size: 4.0 Number of attempts: 1 Placement Confirmation: positive ETCO2, CO2 detector and breath sounds checked- equal and bilateral Tube secured with: Tape

## 2023-08-27 NOTE — Anesthesia Postprocedure Evaluation (Signed)
 Anesthesia Post Note  Patient: Vanessa Fisher  Procedure(s) Performed: INCISION AND DRAINAGE, ABSCESS, BREAST (Left: Breast)  Patient location during evaluation: PACU Anesthesia Type: General Level of consciousness: awake and alert, oriented and patient cooperative Pain management: pain level controlled Vital Signs Assessment: post-procedure vital signs reviewed and stable Respiratory status: spontaneous breathing, nonlabored ventilation and respiratory function stable Cardiovascular status: blood pressure returned to baseline and stable Postop Assessment: adequate PO intake Anesthetic complications: no   No notable events documented.   Last Vitals:  Vitals:   08/27/23 1308 08/27/23 1315  BP:  116/69  Pulse: 66 67  Resp: 19 17  Temp: (!) 36.1 C   SpO2: 100% 100%    Last Pain:  Vitals:   08/27/23 1307  PainSc: 5                  Alfonso Ruths

## 2023-08-27 NOTE — Anesthesia Preprocedure Evaluation (Addendum)
 Anesthesia Evaluation  Patient identified by MRN, date of birth, ID band Patient awake    Reviewed: Allergy & Precautions, NPO status , Patient's Chart, lab work & pertinent test results  History of Anesthesia Complications Negative for: history of anesthetic complications  Airway Mallampati: IV   Neck ROM: Full    Dental  (+) Upper Dentures, Lower Dentures   Pulmonary asthma    Pulmonary exam normal breath sounds clear to auscultation       Cardiovascular negative cardio ROS Normal cardiovascular exam Rhythm:Regular Rate:Normal     Neuro/Psych  Headaches PSYCHIATRIC DISORDERS Anxiety Depression    Chronic pain    GI/Hepatic ,GERD  Medicated and Controlled,,  Endo/Other  diabetes, Type 2  Obesity   Renal/GU Renal disease (nephrolithiasis)     Musculoskeletal  (+) Arthritis ,    Abdominal   Peds  Hematology   Anesthesia Other Findings   Reproductive/Obstetrics                              Anesthesia Physical Anesthesia Plan  ASA: 2  Anesthesia Plan: General   Post-op Pain Management:    Induction: Intravenous  PONV Risk Score and Plan: 3 and Ondansetron , Dexamethasone  and Treatment may vary due to age or medical condition  Airway Management Planned: LMA  Additional Equipment:   Intra-op Plan:   Post-operative Plan: Extubation in OR  Informed Consent: I have reviewed the patients History and Physical, chart, labs and discussed the procedure including the risks, benefits and alternatives for the proposed anesthesia with the patient or authorized representative who has indicated his/her understanding and acceptance.     Dental advisory given  Plan Discussed with: CRNA  Anesthesia Plan Comments: (Patient consented for risks of anesthesia including but not limited to:  - adverse reactions to medications - damage to eyes, teeth, lips or other oral mucosa - nerve damage  due to positioning  - sore throat or hoarseness - damage to heart, brain, nerves, lungs, other parts of body or loss of life  Informed patient about role of CRNA in peri- and intra-operative care.  Patient voiced understanding.)         Anesthesia Quick Evaluation

## 2023-08-27 NOTE — Discharge Instructions (Signed)
  Diet: Resume home heart healthy regular diet.   Activity: No heavy lifting >20 pounds (children, pets, laundry, garbage) or strenuous activity until follow-up, but light activity and walking are encouraged. Do not drive or drink alcohol if taking narcotic pain medications.  Wound care: Remove dressing tomorrow. Once dressing removed, may shower with soapy water  and pat dry (do not rub incisions), but no baths or submerging incision underwater until follow-up. (no swimming). Then, apply dry plain gauze on the outside to absorb any drainage.   Medications: Resume all home medications. For mild to moderate pain: acetaminophen  (Tylenol ) or ibuprofen  (if no kidney disease). Combining Tylenol  with alcohol can substantially increase your risk of causing liver disease. Narcotic pain medications, if prescribed, can be used for severe pain, though may cause nausea, constipation, and drowsiness. If you do not need the narcotic pain medication, you do not need to fill the prescription.  Call office [phone]) at any time if any questions, worsening pain, fevers/chills, bleeding, drainage from incision site, or other concerns.

## 2023-08-28 ENCOUNTER — Encounter: Payer: Self-pay | Admitting: General Surgery

## 2023-08-30 ENCOUNTER — Telehealth: Payer: Self-pay

## 2023-08-30 DIAGNOSIS — C50911 Malignant neoplasm of unspecified site of right female breast: Secondary | ICD-10-CM

## 2023-08-30 LAB — SURGICAL PATHOLOGY

## 2023-08-30 NOTE — Telephone Encounter (Signed)
 Pt aware of plan. Please schedule appts and contact pt with appt details:   MRI breast w wo PET scan ASAP  MD in 1 -2 weeks  (at least 3 days after scans)   Pt requesting MRI and PET to be same day due to transportation.

## 2023-08-30 NOTE — Telephone Encounter (Signed)
 Per Dr. Babara please let patient know that her recent breast biopsy showed cancer. I ask pathology to add on additional tests to see if this is the same kind cancer comparing previously treated one. I recommend PET scan ASAP as well bilateral breast MRI w wo contrast STAT. please arrange her to see me in 1-2 weeks. {at least 1 week so the [facility] results are back. )   Pt informed of plan.

## 2023-09-01 ENCOUNTER — Encounter: Payer: Self-pay | Admitting: Oncology

## 2023-09-01 ENCOUNTER — Ambulatory Visit
Admission: RE | Admit: 2023-09-01 | Discharge: 2023-09-01 | Disposition: A | Discharge status: Home / Self Care | Source: Ambulatory Visit | Attending: Oncology | Admitting: Oncology

## 2023-09-01 ENCOUNTER — Encounter: Payer: Self-pay | Admitting: General Surgery

## 2023-09-01 DIAGNOSIS — Z171 Estrogen receptor negative status [ER-]: Secondary | ICD-10-CM | POA: Insufficient documentation

## 2023-09-01 DIAGNOSIS — C50911 Malignant neoplasm of unspecified site of right female breast: Secondary | ICD-10-CM | POA: Diagnosis present

## 2023-09-01 LAB — AEROBIC/ANAEROBIC CULTURE W GRAM STAIN (SURGICAL/DEEP WOUND)
Culture: NO GROWTH
Culture: NO GROWTH
Gram Stain: NONE SEEN
Gram Stain: NONE SEEN

## 2023-09-01 MED ORDER — GADOBUTROL 1 MMOL/ML IV SOLN
10.0000 mL | Freq: Once | INTRAVENOUS | Status: AC | PRN
  Administered 2023-09-01: 10 mL via INTRAVENOUS

## 2023-09-01 NOTE — Telephone Encounter (Signed)
 Appt scheduled

## 2023-09-02 ENCOUNTER — Ambulatory Visit: Payer: Self-pay | Admitting: Oncology

## 2023-09-02 ENCOUNTER — Other Ambulatory Visit: Payer: Self-pay | Admitting: Oncology

## 2023-09-02 ENCOUNTER — Telehealth: Payer: Self-pay

## 2023-09-02 DIAGNOSIS — R928 Other abnormal and inconclusive findings on diagnostic imaging of breast: Secondary | ICD-10-CM

## 2023-09-02 NOTE — Telephone Encounter (Signed)
 Randine from [facility] radiology calling in regards to breast MR results.

## 2023-09-03 ENCOUNTER — Other Ambulatory Visit: Payer: Self-pay

## 2023-09-03 DIAGNOSIS — R928 Other abnormal and inconclusive findings on diagnostic imaging of breast: Secondary | ICD-10-CM

## 2023-09-03 NOTE — Progress Notes (Signed)
 MRI breast bx scheduled 8/7

## 2023-09-03 NOTE — Telephone Encounter (Signed)
 Dr. Babara has reviewed results

## 2023-09-03 NOTE — Progress Notes (Signed)
 Pt scheduled for US  breast bx on 8/5, PET on 8/8. Orders for MRI breast bx placed and [facility] will contact pt to schedule appt.

## 2023-09-07 ENCOUNTER — Inpatient Hospital Stay: Admission: RE | Admit: 2023-09-07 | Source: Ambulatory Visit

## 2023-09-07 ENCOUNTER — Other Ambulatory Visit

## 2023-09-07 LAB — AEROBIC/ANAEROBIC CULTURE W GRAM STAIN (SURGICAL/DEEP WOUND)
Culture: NO GROWTH
Gram Stain: NONE SEEN

## 2023-09-08 ENCOUNTER — Ambulatory Visit
Admission: RE | Admit: 2023-09-08 | Discharge: 2023-09-08 | Disposition: A | Discharge status: Home / Self Care | Source: Ambulatory Visit | Attending: Oncology | Admitting: Oncology

## 2023-09-08 ENCOUNTER — Ambulatory Visit
Admission: RE | Admit: 2023-09-08 | Discharge: 2023-09-08 | Disposition: A | Discharge status: Home / Self Care | Source: Ambulatory Visit | Attending: Oncology

## 2023-09-08 DIAGNOSIS — R928 Other abnormal and inconclusive findings on diagnostic imaging of breast: Secondary | ICD-10-CM

## 2023-09-08 DIAGNOSIS — C773 Secondary and unspecified malignant neoplasm of axilla and upper limb lymph nodes: Secondary | ICD-10-CM | POA: Insufficient documentation

## 2023-09-08 MED ORDER — LIDOCAINE-EPINEPHRINE 1 %-1:100000 IJ SOLN
5.0000 mL | Freq: Once | INTRAMUSCULAR | Status: AC
  Administered 2023-09-08: 5 mL
  Filled 2023-09-08: qty 5

## 2023-09-08 MED ORDER — LIDOCAINE 1 % OPTIME INJ - NO CHARGE
2.0000 mL | Freq: Once | INTRAMUSCULAR | Status: AC
  Administered 2023-09-08: 2 mL
  Filled 2023-09-08: qty 2

## 2023-09-09 ENCOUNTER — Encounter: Payer: Self-pay | Admitting: Oncology

## 2023-09-09 ENCOUNTER — Encounter

## 2023-09-09 ENCOUNTER — Telehealth: Payer: Self-pay

## 2023-09-09 ENCOUNTER — Inpatient Hospital Stay: Attending: Oncology | Admitting: Oncology

## 2023-09-09 ENCOUNTER — Inpatient Hospital Stay: Admission: RE | Admit: 2023-09-09 | Source: Ambulatory Visit

## 2023-09-09 ENCOUNTER — Other Ambulatory Visit

## 2023-09-09 VITALS — BP 131/57 | HR 61 | Temp 96.8°F | Resp 18 | Wt 242.0 lb

## 2023-09-09 DIAGNOSIS — Z5112 Encounter for antineoplastic immunotherapy: Secondary | ICD-10-CM | POA: Diagnosis present

## 2023-09-09 DIAGNOSIS — C50212 Malignant neoplasm of upper-inner quadrant of left female breast: Secondary | ICD-10-CM | POA: Insufficient documentation

## 2023-09-09 DIAGNOSIS — C50911 Malignant neoplasm of unspecified site of right female breast: Secondary | ICD-10-CM

## 2023-09-09 DIAGNOSIS — C773 Secondary and unspecified malignant neoplasm of axilla and upper limb lymph nodes: Secondary | ICD-10-CM | POA: Insufficient documentation

## 2023-09-09 DIAGNOSIS — D649 Anemia, unspecified: Secondary | ICD-10-CM | POA: Insufficient documentation

## 2023-09-09 DIAGNOSIS — F419 Anxiety disorder, unspecified: Secondary | ICD-10-CM | POA: Insufficient documentation

## 2023-09-09 DIAGNOSIS — Z170 Estrogen receptor positive status [ER+]: Secondary | ICD-10-CM | POA: Insufficient documentation

## 2023-09-09 DIAGNOSIS — Z171 Estrogen receptor negative status [ER-]: Secondary | ICD-10-CM | POA: Diagnosis not present

## 2023-09-09 LAB — SURGICAL PATHOLOGY

## 2023-09-09 NOTE — Assessment & Plan Note (Signed)
 Exacerbated by recurrence diagnosis. Recommend patient to resume Lexapro .

## 2023-09-09 NOTE — Assessment & Plan Note (Addendum)
 Left breast cancer, cT4b N3 Mx, ER/PR-, HER2 + S/p neoadjuvant chemotherapy 6 cycles of TCHP  S/p left lumpectomy  + SLNB -ypT0 ypN0, complete pathological response.  S/p adjuvant Transtuzumab and Pertuzumab  11 cycles. S/p adjuvant radiation-  Declined adjuvant Zometa.  June/July 2025, left breast erythematous/edema, status post antibiotics treatments and seroma aspiration.  08/27/2023 skin punch biopsy showed ER positive PR negative HER2 positive breast cancer recurrence.  09/08/2023, ultrasound-guided right axillary lymph node biopsy showed metastatic carcinoma.  ER/PR HER2 pending. Pathology results and MRI results were reviewed with patient. Recommend patient to keep PET scan for further evaluation and staging. She missed her appointment for MRI biopsy of right breast with abnormal findings seen on MRI.  Plan to reschedule.  Also recommend breast obtain brain MRI with and without contrast.  Likely stage IV breast cancer given the contralateral axillary lymph node involvement. recommend systemic palliative chemotherapy-  Fam-trastuzumab  deruxtecan- Enhertu Obtain echocardiogram/MUGA for evaluation of LVEF.

## 2023-09-09 NOTE — Assessment & Plan Note (Signed)
 Recommend patient to take calcium 1200mg  daily, otc supply.  Take vitamin D supplementation.

## 2023-09-09 NOTE — Telephone Encounter (Signed)
 [facility] NGS testing (xR+xT) requested on Breast specimen [id number] collected on 08/27/23.   Financial assistance application completed and approved. Patient's out-of-pocket cost will be $0

## 2023-09-09 NOTE — Progress Notes (Signed)
 DISCONTINUE ON PATHWAY REGIMEN - Breast     A cycle is every 21 days:     Trastuzumab -xxxx      Pertuzumab    **Always confirm dose/schedule in your pharmacy ordering system**  PRIOR TREATMENT: [id number]: Trastuzumab  IV + Pertuzumab  IV q21 Days to Complete One Year of Therapy  START ON PATHWAY REGIMEN - Breast     A cycle is every 21 days:     Fam-trastuzumab  deruxtecan-nxki   **Always confirm dose/schedule in your pharmacy ordering system**  Patient Characteristics: Distant Metastases or Locoregional Recurrent Disease - Unresected, M0 or Locally Advanced Unresectable Disease Progressing after Neoadjuvant and Local Therapies, M0, HER2 Positive, ER Positive, Chemotherapy + HER2-Targeted Therapy, First Line Therapeutic Status: Distant Metastases HER2 Status: Positive (+) ER Status: Positive (+) PR Status: Negative (-) Line of Therapy: First Line Intent of Therapy: Non-Curative / Palliative Intent, Discussed with Patient

## 2023-09-09 NOTE — Progress Notes (Signed)
 Hematology/Oncology Progress note [phone] [phone]     CHIEF COMPLAINTS/REASON FOR VISIT:  Follow-up for Stage IIIB left breast cancer, ER-, HER2+  ASSESSMENT & PLAN:   Cancer Staging  Breast cancer [facility]) Staging form: Breast, AJCC 8th Edition - Clinical stage from 08/21/2021: Stage IIIB (cT4b, cN3c, cM0, G3, ER-, PR-, HER2+) - Signed by [name], MD on 09/12/2021  Breast cancer (HCC) Left breast cancer, cT4b N3 Mx, ER/PR-, HER2 + S/p neoadjuvant chemotherapy 6 cycles of TCHP  S/p left lumpectomy  + SLNB -ypT0 ypN0, complete pathological response.  S/p adjuvant Transtuzumab and Pertuzumab  11 cycles. S/p adjuvant radiation-  Declined adjuvant Zometa.  June/July 2025, left breast erythematous/edema, status post antibiotics treatments and seroma aspiration.  08/27/2023 skin punch biopsy showed ER positive PR negative HER2 positive breast cancer recurrence.  09/08/2023, ultrasound-guided right axillary lymph node biopsy showed metastatic carcinoma.  ER/PR HER2 pending. Pathology results and MRI results were reviewed with patient. Recommend patient to keep PET scan for further evaluation and staging. She missed her appointment for MRI biopsy of right breast with abnormal findings seen on MRI.  Plan to reschedule.  Also recommend breast obtain brain MRI with and without contrast.  Likely stage IV breast cancer given the contralateral axillary lymph node involvement. recommend systemic palliative chemotherapy-  Fam-trastuzumab  deruxtecan- Enhertu Obtain echocardiogram/MUGA for evaluation of LVEF.   Anxiety Exacerbated by recurrence diagnosis. Recommend patient to resume Lexapro .  Hypocalcemia Recommend patient to take calcium  1200mg  daily, otc supply.  Take vitamin D  supplementation.      Orders Placed This Encounter  Procedures   MR Brain W Wo Contrast    Standing Status:   Future    Expected Date:   09/10/2023    Expiration Date:   09/08/2024    If  indicated for the ordered procedure, I authorize the administration of contrast media per Radiology protocol:   Yes    What is the patient's sedation requirement?:   No Sedation    Does the patient have a pacemaker or implanted devices?:   No    Use SRS Protocol?:   Yes    Preferred imaging location?:   [facility] (table limit - 500lbs)   NM Cardiac [place] [facility]    Standing Status:   Future    Expiration Date:   09/08/2024    Preferred imaging location?:   [facility]   ECHOCARDIOGRAM COMPLETE    Standing Status:   Future    Expiration Date:   09/08/2024    Where should this test be performed:   [facility]    Perflutren DEFINITY (image enhancing agent) should be administered unless hypersensitivity or allergy exist:   Administer Perflutren    Reason for exam-Echo:   Chemo  Z09   Follow up TBD  We spent sufficient time to discuss many aspect of care, questions were answered to patient's satisfaction. A total of 45 minutes was spent on this visit.  With 10 minutes spent reviewing image findings, pathology reports, 30 minutes counseling the patient on the recurrence diagnosis, goal of care, further imaging staging plan and management.  Additional 5 minutes was spent on answering patient's questions.  All questions were answered. The patient knows to call the clinic with any problems questions or concerns.   Call Babara, MD, PhD [facility] Hematology Oncology 09/09/2023      HISTORY OF PRESENTING ILLNESS:  Vanessa Fisher is a  63 y.o.  female with PMH listed below who was referred to  me for evaluation of  left breast cancer SUMMARY OF ONCOLOGIC HISTORY: Oncology History  Breast cancer (HCC)  07/31/2021 Imaging   Bilateral diagnostic mammogram and US  showed 5 centimeter LEFT breast mass associated with pleomorphic calcifications is suspicious for invasive ductal carcinoma.At least 4 LEFT axillary lymph nodes with abnormal morphology   08/21/2021 Cancer Staging    Staging form: Breast, AJCC 8th Edition - Clinical: Stage IIIB (cT4b, cN3c, cM0, G3, ER-, PR-, HER2+) - Signed by [name], MD on 08/28/2021 Histologic grading system: 3 grade system  -08/21/21 left breast ultrasound-guided biopsy showed invasive mammary carcinoma, grade 3, ER/PR negative, HER2 positive.  Left axillary lymph node biopsy positive for macro metastatic mammary carcinoma, 8 mm in greatest extent.   08/30/2021 Imaging   MRI brain w wo contrast -No metastatic disease or acute intracranial abnormality. Essentially normal for age MRI appearance of the brain.    09/03/2021 Echocardiogram   1. Left ventricular ejection fraction, by estimation, is 55 to 60%. Left ventricular ejection fraction by 3D volume is 58 %. The left ventricle has normal function. The left ventricle has no regional wall motion abnormalities. There is mild left  ventricular hypertrophy. Left ventricular diastolic parameters were normal.  2. Right ventricular systolic function is normal. The right ventricular size is normal.  3. The mitral valve is normal in structure. No evidence of mitral valve regurgitation.  4. The aortic valve was not well visualized. Aortic valve regurgitation is not visualized   09/10/2021 Imaging   PET scan showed Large hypermetabolic left breast mass and diffuse skin thickening,consistent with primary breast carcinoma. Hypermetabolic lymphadenopathy in the left axilla, left subpectoral region, and left supraclavicular region, consistent with metastatic disease. No evidence of metastatic disease within the abdomen or pelvis   09/12/2021 - 02/13/2022 Chemotherapy   Patient is on Treatment Plan : BREAST  Docetaxel  + Carboplatin  + Trastuzumab  + Pertuzumab   (TCHP) q21d       Genetic Testing   Negative genetic testing. No pathogenic variants identified on the [facility] Common Hereditary Cancers+RNA panel. The report date is 10/26/2021.  The Common Hereditary Cancers Panel + RNA offered by [facility]  includes sequencing and/or deletion duplication testing of the following 47 genes: APC, ATM, AXIN2, BARD1, BMPR1A, BRCA1, BRCA2, BRIP1, CDH1, CDKN2A (p14ARF), CDKN2A (p16INK4a), CKD4, CHEK2, CTNNA1, DICER1, EPCAM (Deletion/duplication testing only), GREM1 (promoter region deletion/duplication testing only), KIT, MEN1, MLH1, MSH2, MSH3, MSH6, MUTYH, NBN, NF1, NHTL1, PALB2, PDGFRA, PMS2, POLD1, POLE, PTEN, RAD50, RAD51C, RAD51D, SDHB, SDHC, SDHD, SMAD4, SMARCA4. STK11, TP53, TSC1, TSC2, and VHL.  The following genes were evaluated for sequence changes only: SDHA and HOXB13 c.251G>A variant only.   02/09/2022 Echocardiogram   LVEF 55 to 60%    03/27/2022 Surgery   S/p left mastectomy + SLNB ypT0 ypN0,    04/24/2022 -  Chemotherapy   Patient is on Treatment Plan : BREAST Trastuzumab   + Pertuzumab  q21d x 13 cycles     05/06/2022 Echocardiogram   LVEF 55 to 60%    07/02/2022 - 08/03/2022 Radiation Therapy   Adjuvant breast radiation.    10/20/2022 Imaging   Bone scan whole body showed 1. Degenerative uptake at the sternoclavicular joints, shoulders,knees and hindfoot bilaterally. 2. No evidence for metastatic disease to the. 3. Uptake in the left breast may be related to postsurgical change or residual tumor.   07/28/2023 Mammogram   Unilateral left breast mammogram showed  1. Diffuse MEDIAL LEFT breast erythema and pain without interval change in mammographic appearance or suspicious sonographic  abnormality. Stable skin thickening in this area is presumably due to prior radiation therapy. Findings are most suspicious for cellulitis. Recommend treatment with antibiotics. If antibiotics fail to resolve the erythema, surgical or dermatologic consultation for skin punch biopsy is recommended to exclude inflammatory breast cancer.   2. Redemonstrated LEFT breast seroma at the lumpectomy site, now with complex internal debris. Patient requests aspiration of the fluid component of this collection,  which will be attempted under ultrasound guidance.   08/27/2023 Relapse/Recurrence   08/27/2023, left breast 11:00 punch biopsy showed 1. Breast, biopsy, left 11:00 punch BX :       - INVASIVE GRADE 3 MAMMARY CARCINOMA  INVADING SKIN AND EPIDERMIS WITH       ULCERATION  ER 70% positive, PR 0%, HER2 positive(3+).    09/08/2023 right axilla lymph node biopsy showed 1. Lymph node, needle/core biopsy, right axilla, hydromark coil clip :       ONE LYMPH NODE, POSITIVE FOR METASTATIC CARCINOMA MORPHOLOGICALLY COMPATIBLE       WITH BREAST PRIMARY (1/1).       METASTATIC FOCUS: 11 MM / 1.1 CM.       EXTRANODAL EXTENSION IDENTIFIE    09/01/2023 Imaging   MRI breast bilaterally without contrast showed  1. Widespread abnormal enhancement involving the skin and nipple of the LEFT breast, involving all 4 quadrants and compatible with known, biopsy proven LEFT breast cancer invading the skin. There is likely slight RETROAREOLAR extension of the nipple involvement extending 8 mm posteriorly from the nipple.   2. Post treatment changes of the LEFT breast without other evidence of recurrent malignancy in the LEFT breast. Thin peripheral enhancement at the lumpectomy site is nonspecific but favored to be due to recent instrumentation.   3. Markedly enlarged RIGHT axillary level 1 lymph nodes, for which second-look ultrasound and ultrasound-guided biopsy is recommended. Small but asymmetric RIGHT axillary level 2 lymph nodes are also suspicious for malignancy but not amenable to percutaneous sampling.   4. Indeterminate RIGHT breast focus at the 9 o'clock periareolar position anterior depth measuring 3 mm and indeterminate RIGHT breast mass at the 9 o'clock central position at middle depth measuring 5 mm. If management would be impacted, MRI guided biopsies of the focus and mass at 9 o'clock in the RIGHT breast is recommended.   5. Confluent enhancement along the LEFT internal mammary vessels  which could reflect ectatic vascular structures or an enlarged LEFT internal mammary lymph node. Recommend attention on forthcoming PET-CT.      + chronic nasal pressure, congestion, horse voice she attributes to sinusitis.   INTERVAL HISTORY Vanessa Fisher is a 63 y.o. female who has above history reviewed by me today presents for follow up visit for recurrent HER2 positive breast cancer  -  left breast persistent swelling with drainage in the site of previous biopsy. Patient feels overwhelmed about multiple appointments.  She missed her MRI breast biopsy.   MEDICAL HISTORY:  Past Medical History:  Diagnosis Date   Anemia    Anxiety    Arthritis    Asthma    WELL CONTROLLED   Bile acid malabsorption syndrome    Bradycardia    HAD AN ISSUE WITH THIS IN 2016-NO PROBLEMS SINCE   Breast cancer, left breast (HCC) 08/2021   Carpal tunnel syndrome on right    Depression    Diabetes mellitus without complication (HCC)    Diverticulitis    Gastric reflux    GERD (gastroesophageal reflux disease)  Hand pain 05/12/2016   Headache    H/O MIGRAINES   History of kidney stones    H/O   Lumbar radiculitis    Neck pain, chronic    Osteoarthritis of both knees    Panic attacks    Personal history of chemotherapy    Personal history of radiation therapy     SURGICAL HISTORY: Past Surgical History:  Procedure Laterality Date   BREAST BIOPSY Left 08/21/2021   Axilla Bx, Hydromarker, neg   BREAST BIOPSY Left 08/21/2021   US  Bx, Ribbon Clip, invasive mammary carcinoma   BREAST BIOPSY Left 03/12/2022   US  LT RADIO FREQUENCY TAG LOC US  GUIDE 03/12/2022 [facility]   BREAST BIOPSY Left 03/12/2022   US  LT RADIO FREQUENCY TAG EA ADD LESION LOC US  GUIDE 03/12/2022 [facility]   BREAST LUMPECTOMY Left 03/27/2022   CARPAL TUNNEL RELEASE Right 12/06/2017   Procedure: CARPAL TUNNEL RELEASE;  Surgeon: Cleotilde Barrio, MD;  Location: [facility];  Service: Orthopedics;  Laterality:  Right;   COLONOSCOPY WITH PROPOFOL  N/A 06/11/2021   Procedure: COLONOSCOPY WITH PROPOFOL ;  Surgeon: Unk Corinn Skiff, MD;  Location: [facility];  Service: Gastroenterology;  Laterality: N/A;   DORSAL COMPARTMENT RELEASE Right 12/06/2017   Procedure: RELEASE DORSAL COMPARTMENT (DEQUERVAIN);  Surgeon: Cleotilde Barrio, MD;  Location: [facility];  Service: Orthopedics;  Laterality: Right;   ESOPHAGOGASTRODUODENOSCOPY (EGD) WITH PROPOFOL  N/A 06/11/2021   Procedure: ESOPHAGOGASTRODUODENOSCOPY (EGD) WITH PROPOFOL ;  Surgeon: Unk Corinn Skiff, MD;  Location: [facility] ENDOSCOPY;  Service: Gastroenterology;  Laterality: N/A;   INCISION AND DRAINAGE, ABSCESS, BREAST Left 08/27/2023   Procedure: INCISION AND DRAINAGE, ABSCESS, BREAST;  Surgeon: Rodolph Romano, MD;  Location: [facility];  Service: General;  Laterality: Left;   PART MASTECTOMY,RADIO FREQUENCY LOCALIZER,AXILLARY SENTINEL NODE BIOPSY Left 03/27/2022   Procedure: PART MASTECTOMY,RADIO FREQUENCY LOCALIZER,AXILLARY SENTINEL NODE BIOPSY;  Surgeon: Rodolph Romano, MD;  Location: [facility];  Service: General;  Laterality: Left;   PARTIAL HYSTERECTOMY     PORTACATH PLACEMENT N/A 09/24/2021   Procedure: INSERTION PORT-A-CATH;  Surgeon: Rodolph Romano, MD;  Location: [facility];  Service: General;  Laterality: N/A;   TUBAL LIGATION      SOCIAL HISTORY: Social History   Socioeconomic History   Marital status: Single    Spouse name: Not on file   Number of children: Not on file   Years of education: Not on file   Highest education level: Not on file  Occupational History   Not on file  Tobacco Use   Smoking status: Former    Types: Cigarettes   Smokeless tobacco: Never  Vaping Use   Vaping status: Never Used  Substance and Sexual Activity   Alcohol use: No   Drug use: No   Sexual activity: Not on file  Other Topics Concern   Not on file  Social History Narrative   Lives alone   Social Drivers of Health   Financial  Resource Strain: Low Risk  (03/29/2023)   Received from [facility]   Overall Financial Resource Strain (CARDIA)    Difficulty of Paying Living Expenses: Not hard at all  Food Insecurity: Food Insecurity Present (03/29/2023)   Received from [facility]   Hunger Vital Sign    Within the past 12 months, you worried that your food would run out before you got the money to buy more.: Sometimes true    Within the past 12 months, the food you bought just didn't last and you didn't have money to get  more.: Sometimes true  Transportation Needs: No Transportation Needs (03/29/2023)   Received [facility] - [facility]    In the past 12 months, has lack of transportation kept you from medical appointments or from getting medications?: No    Lack of Transportation (Non-Medical): No  Physical Activity: Inactive (08/15/2021)   Exercise Vital Sign    Days of Exercise per Week: 0 days    Minutes of Exercise per Session: 0 min  Stress: Stress Concern Present (08/15/2021)   [employer] of Occupational Health - Occupational Stress Questionnaire    Feeling of Stress : Rather much  Social Connections: Socially Isolated (08/15/2021)   Social Connection and Isolation Panel    Frequency of Communication with Friends and Family: Once a week    Frequency of Social Gatherings with Friends and Family: Once a week    Attends Religious Services: Never    Database administrator or Organizations: No    Attends Engineer, structural: Not on file    Marital Status: Never married  Intimate Partner Violence: Not At Risk (08/15/2021)   Humiliation, Afraid, Rape, and Kick questionnaire    Fear of Current or Ex-Partner: No    Emotionally Abused: No    Physically Abused: No    Sexually Abused: No    FAMILY HISTORY: Family History  Problem Relation Age of Onset   Diabetes Mother    Hypertension Mother    Heart failure Mother     Pancreatic cancer Mother 69   Diabetes Father    Hypertension Father    Breast cancer Maternal Aunt    Cervical cancer Maternal Aunt    Lung cancer Son 45    ALLERGIES:  is allergic to bc powder [aspirin-salicylamide-caffeine], doxycycline , gabapentin , hydrocodone -acetaminophen , latex, penicillin g, penicillins, and shellfish allergy.  MEDICATIONS:  Current Outpatient Medications  Medication Sig Dispense Refill   albuterol  (PROVENTIL ) (2.5 MG/3ML) 0.083% nebulizer solution Take 3 mLs (2.5 mg total) by nebulization every 4 (four) hours as needed for wheezing or shortness of breath. 75 mL 2   albuterol  (VENTOLIN  HFA) 108 (90 Base) MCG/ACT inhaler Inhale 2 puffs into the lungs every 6 (six) hours as needed for wheezing or shortness of breath. 18 g 0   allopurinol (ZYLOPRIM) 100 MG tablet Take 100 mg by mouth daily.     atorvastatin (LIPITOR) 10 MG tablet Take 10 mg by mouth daily.     Azelastine  HCl 137 MCG/SPRAY SOLN Place 1 spray into the nose daily. 30 mL 0   calcium  carbonate (OS-CAL - DOSED IN MG OF ELEMENTAL CALCIUM ) 1250 (500 Ca) MG tablet Take 1 tablet (1,250 mg total) by mouth daily. 90 tablet 1   celecoxib (CELEBREX) 200 MG capsule Take by mouth 2 (two) times daily.     cetirizine  (ZYRTEC  ALLERGY) 10 MG tablet Take 1 tablet (10 mg total) by mouth daily. 90 tablet 0   cyclobenzaprine  (FLEXERIL ) 10 MG tablet Take 10 mg by mouth 3 (three) times daily.     diclofenac Sodium (VOLTAREN) 1 % GEL Apply topically.     diphenoxylate -atropine  (LOMOTIL ) 2.5-0.025 MG tablet Take 1 tablet by mouth 4 (four) times daily as needed for diarrhea or loose stools. 120 tablet 0   docusate sodium  (COLACE) 100 MG capsule Take 1 capsule (100 mg total) by mouth 2 (two) times daily.     fluticasone  (FLONASE ) 50 MCG/ACT nasal spray Place 2 sprays into both nostrils daily. 15.8 mL 0   Iron -Vitamin C   65-125 MG TABS Take 1 tablet by mouth daily at 2 PM. 30 tablet 2   KLOR-CON  M20 20 MEQ tablet TAKE 1 TABLET BY  MOUTH TWICE A DAY 60 tablet 1   lidocaine -prilocaine  (EMLA ) cream Apply 1 Application topically as needed. 30 g 12   magnesium  chloride (SLOW-MAG) 64 MG TBEC SR tablet Take 1 tablet (64 mg total) by mouth 2 (two) times daily. 60 tablet 2   oxyCODONE  HCl 10 MG TABA Take 10 mg by mouth every 4 (four) hours as needed. 14 tablet 0   pantoprazole (PROTONIX) 20 MG tablet Take 20 mg by mouth daily.     pregabalin (LYRICA) 50 MG capsule Take 50 mg by mouth 3 (three) times daily.     sulfamethoxazole -trimethoprim  (BACTRIM  DS) 800-160 MG tablet Take 1 tablet by mouth 2 (two) times daily.     escitalopram  (LEXAPRO ) 10 MG tablet Take 1 tablet (10 mg total) by mouth daily. (Patient not taking: Reported on 09/09/2023) 30 tablet 1   fluconazole  (DIFLUCAN ) 150 MG tablet Take 1 tablet (150 mg total) by mouth once a week. (Patient not taking: Reported on 09/09/2023) 2 tablet 0   naloxone (NARCAN) nasal spray 4 mg/0.1 mL  (Patient not taking: Reported on 09/09/2023)     No current facility-administered medications for this visit.   Facility-Administered Medications Ordered in Other Visits  Medication Dose Route Frequency Provider Last Rate Last Admin   magnesium  sulfate IVPB 2 g 50 mL  2 g Intravenous Once Covington, Sarah M, PA-C        Review of Systems  Constitutional:  Negative for appetite change, chills, fatigue and fever.  HENT:   Negative for hearing loss and voice change.   Eyes:  Negative for eye problems.  Respiratory:  Negative for chest tightness and cough.   Cardiovascular:  Negative for chest pain.  Gastrointestinal:  Negative for abdominal distention, abdominal pain, blood in stool and diarrhea.  Endocrine: Negative for hot flashes.  Genitourinary:  Negative for difficulty urinating and frequency.   Musculoskeletal:  Positive for arthralgias and back pain.       Leg cramps  Skin:  Negative for itching and rash.  Neurological:  Negative for extremity weakness and headaches.  Hematological:   Negative for adenopathy.  Psychiatric/Behavioral:  Negative for confusion.   Left breast pain   PHYSICAL EXAMINATION: ECOG PERFORMANCE STATUS: 1 - Symptomatic but completely ambulatory Vitals:   09/09/23 1007  BP: (!) 131/57  Pulse: 61  Resp: 18  Temp: (!) 96.8 F (36 C)   Filed Weights   09/09/23 1007  Weight: 242 lb (109.8 kg)    Physical Exam Constitutional:      General: She is not in acute distress.    Appearance: She is not diaphoretic.  HENT:     Head: Normocephalic and atraumatic.  Eyes:     General: No scleral icterus. Cardiovascular:     Rate and Rhythm: Normal rate and regular rhythm.     Heart sounds: No murmur heard. Pulmonary:     Effort: Pulmonary effort is normal. No respiratory distress.     Breath sounds: No wheezing.  Abdominal:     General: There is no distension.     Palpations: Abdomen is soft.     Tenderness: There is no abdominal tenderness.  Musculoskeletal:        General: Normal range of motion.     Cervical back: Normal range of motion and neck supple.  Skin:    General:  Skin is warm and dry.     Findings: No erythema.  Neurological:     Mental Status: She is alert and oriented to person, place, and time.     Cranial Nerves: No cranial nerve deficit.     Motor: No abnormal muscle tone.     Coordination: Coordination normal.  Psychiatric:        Mood and Affect: Affect normal.     Comments: Anxious      Left breast is warm and swollen,  with a palpable, firm mass at the 3 o'clock position, drainage at the previous biopsy sites.   LABORATORY DATA:  I have reviewed the data as listed    Latest Ref Rng & Units 08/27/2023   10:36 AM 07/20/2023   10:34 AM 04/28/2023    8:54 AM  CBC  WBC 4.0 - 10.5 K/uL  6.6  4.8   Hemoglobin 12.0 - 15.0 g/dL 88.3  89.6  89.8   Hematocrit 36.0 - 46.0 % 34.0  33.2  32.6   Platelets 150 - 400 K/uL  272  266       Latest Ref Rng & Units 08/27/2023   10:36 AM 07/20/2023   10:34 AM 04/28/2023     8:54 AM  CMP  Glucose 70 - 99 mg/dL 882  858  889   BUN 8 - 23 mg/dL 13  14  14    Creatinine 0.44 - 1.00 mg/dL 9.09  9.06  9.14   Sodium 135 - 145 mmol/L 138  136  137   Potassium 3.5 - 5.1 mmol/L 4.0  3.6  3.6   Chloride 98 - 111 mmol/L 104  104  104   CO2 22 - 32 mmol/L  24  25   Calcium  8.9 - 10.3 mg/dL  8.6  8.9   Total Protein 6.5 - 8.1 g/dL  7.1  6.6   Total Bilirubin 0.0 - 1.2 mg/dL  0.7  0.4   Alkaline Phos 38 - 126 U/L  99  101   AST 15 - 41 U/L  18  20   ALT 0 - 44 U/L  16  14       RADIOGRAPHIC STUDIES: I have personally reviewed the radiological images as listed and agreed with the findings in the report. MM CLIP PLACEMENT RIGHT Result Date: 09/08/2023 CLINICAL DATA:  Same day RIGHT axillary node biopsy EXAM: 3D DIAGNOSTIC RIGHT MAMMOGRAM POST ULTRASOUND BIOPSY COMPARISON:  Previous exam(s). ACR Breast Density Category b: There are scattered areas of fibroglandular density. FINDINGS: 3D Mammographic images were obtained following ultrasound guided biopsy of an enlarged RIGHT axillary lymph node. The biopsy marking clip is in expected position at the site of biopsy. IMPRESSION: Appropriate positioning of the HydroMARK coil shaped biopsy marking clip at the site of biopsy in the RIGHT axilla. Final Assessment: Post Procedure Mammograms for Marker Placement Electronically Signed   By: Norleen Croak M.D.   On: 09/08/2023 10:59   US  AXILLARY NODE CORE BIOPSY RIGHT Result Date: 09/08/2023 CLINICAL DATA:  Enlarged RIGHT axillary lymphadenopathy in the setting of LEFT breast cancer EXAM: US  AXILLARY NODE CORE BIOPSY RIGHT COMPARISON:  Previous exam(s). PROCEDURE: I met with the patient and we discussed the procedure of ultrasound-guided biopsy, including benefits and alternatives. We discussed the high likelihood of a successful procedure. We discussed the risks of the procedure, including infection, bleeding, tissue injury, clip migration, and inadequate sampling. Informed written consent  was given. The usual time-out protocol was performed immediately prior to  the procedure. Using sterile technique and 1% lidocaine  and 1% lidocaine  with epinephrine  as local anesthetic, under direct ultrasound visualization, a 14 gauge spring-loaded device was used to perform biopsy of a representative enlarged RIGHT axillary lymph node using a anti radial approach. At the conclusion of the procedure [facility] coil tissue marker clip was deployed into the biopsy cavity. Follow up 2 view mammogram was performed and dictated separately. IMPRESSION: Ultrasound guided biopsy of a representative enlarged RIGHT axillary lymph node. No apparent complications. Electronically Signed   By: Norleen Croak M.D.   On: 09/08/2023 10:57   US  AXILLA RIGHT Result Date: 09/08/2023 CLINICAL DATA:  LEFT breast inflammatory cancer, RIGHT axillary lymphadenopathy on MRI EXAM: ULTRASOUND OF THE RIGHT AXILLA COMPARISON:  MRI dated September 01, 2023 FINDINGS: Ultrasound is performed, showing multiple enlarged lymph nodes with cortical thickness measuring up to 11 mm. IMPRESSION: RIGHT axillary lymphadenopathy is confirmed via targeted ultrasound. Biopsy was subsequently performed and is separately dictated. RECOMMENDATION: RIGHT axillary lymph node biopsy, which was subsequently performed and is separately dictated. I have discussed the findings and recommendations with the patient. If applicable, a reminder letter will be sent to the patient regarding the next appointment. BI-RADS CATEGORY  4: Suspicious. Electronically Signed   By: Norleen Croak M.D.   On: 09/08/2023 10:44   MR BREAST BILATERAL W WO CONTRAST INC CAD Result Date: 09/02/2023 CLINICAL DATA:  History of LEFT breast cancer status post lumpectomy, radiation and chemotherapy in 2024. Skin punch biopsy on 08/27/2023 demonstrated invasive mammary carcinoma invading the skin in the epidermis at the LEFT breast 11 o'clock biopsy location. EXAM: BILATERAL BREAST MRI WITH AND WITHOUT  CONTRAST TECHNIQUE: Multiplanar, multisequence MR images of both breasts were obtained prior to and following the intravenous administration of 10 ml of Gadavist  Three-dimensional MR images were rendered by post-processing of the original MR data on an independent workstation. The three-dimensional MR images were interpreted, and findings are reported in the following complete MRI report for this study. Three dimensional images were evaluated at the independent interpreting workstation using the DynaCAD thin client. COMPARISON:  Mammogram dated July 28, 2023 FINDINGS: Breast composition: b. Scattered fibroglandular tissue. Background parenchymal enhancement: Mild Right breast: In the RIGHT breast, anterior depth, LATERAL periareolar 9 o'clock position there is a 3 mm focus of enhancement (series 15, image 43 which is larger and more conspicuous the other, benign-appearing foci seen throughout the RIGHT breast. A second 5 mm oval mass with irregular margins is present at the central 9 o'clock position, middle depth, and demonstrates punctate plateau kinetics centrally. It is best appreciated on the third pass imaging. Left breast: Postsurgical changes status post LEFT breast lumpectomy, with diffuse skin thickening and edema. Postsurgical changes of the LEFT axilla are also noted. There is a complex seroma with internal debris at the lumpectomy site. Thin peripheral enhancement laterally is likely due to recent instrumentation, less likely recurrent malignancy. There is diffuse abnormal skin enhancement involving the majority of the LEFT breast in the nipple, most confluent in the direct RETROAREOLAR and LATERAL periareolar region on series 15, image 35, but present in all 4 quadrants (for example upper inner quadrant on image 63; lower inner quadrant on image 27; lower outer quadrant on image 24; and upper outer quadrant on image 58). The skin punch biopsy site is within an area of confluent enhancement on image 51.  The nipple involvement may extend partially into the RETROAREOLAR region (series 15, image 35). Lymph nodes: Markedly enlarged RIGHT axillary level  1 lymph nodes (series 6, images 70 through 88). The largest node measures 2.4 x 2.8 cm in the axial plane (image 84). Two small but asymmetric level 2 nodes are also present on the RIGHT (image 83), the larger measuring up to 5 mm in size. Confluent enhancement surrounding the LEFT internal mammary vessels measuring 1.3 cm (series 14, image 63), possibly reflecting a focally dilated vein given tortuous appearance on axial images and linear configuration when viewed in the sagittal plane. Ancillary findings:  RIGHT chest wall Port-A-Cath in place. IMPRESSION: 1. Widespread abnormal enhancement involving the skin and nipple of the LEFT breast, involving all 4 quadrants and compatible with known, biopsy proven LEFT breast cancer invading the skin. There is likely slight RETROAREOLAR extension of the nipple involvement extending 8 mm posteriorly from the nipple. 2. Post treatment changes of the LEFT breast without other evidence of recurrent malignancy in the LEFT breast. Thin peripheral enhancement at the lumpectomy site is nonspecific but favored to be due to recent instrumentation. 3. Markedly enlarged RIGHT axillary level 1 lymph nodes, for which second-look ultrasound and ultrasound-guided biopsy is recommended. Small but asymmetric RIGHT axillary level 2 lymph nodes are also suspicious for malignancy but not amenable to percutaneous sampling. 4. Indeterminate RIGHT breast focus at the 9 o'clock periareolar position anterior depth measuring 3 mm and indeterminate RIGHT breast mass at the 9 o'clock central position at middle depth measuring 5 mm. If management would be impacted, MRI guided biopsies of the focus and mass at 9 o'clock in the RIGHT breast is recommended. 5. Confluent enhancement along the LEFT internal mammary vessels which could reflect ectatic vascular  structures or an enlarged LEFT internal mammary lymph node. Recommend attention on forthcoming PET-CT. RECOMMENDATION: 1. RIGHT axillary second-look ultrasound and ultrasound-guided biopsy for suspicious lymphadenopathy. 2. RIGHT breast MRI guided biopsy x2, if the results would impact clinical management. 3. Follow-up possible LEFT internal mammary lymph node on forthcoming PET-CT. 4. Otherwise, management of known LEFT breast malignancy involving the skin per surgical/oncology teams. BI-RADS CATEGORY  4: Suspicious. Electronically Signed   By: Norleen Croak M.D.   On: 09/02/2023 12:32

## 2023-09-10 ENCOUNTER — Ambulatory Visit
Admission: RE | Admit: 2023-09-10 | Discharge: 2023-09-10 | Disposition: A | Discharge status: Home / Self Care | Source: Ambulatory Visit | Attending: Oncology | Admitting: Oncology

## 2023-09-10 DIAGNOSIS — C773 Secondary and unspecified malignant neoplasm of axilla and upper limb lymph nodes: Secondary | ICD-10-CM | POA: Diagnosis not present

## 2023-09-10 DIAGNOSIS — C50911 Malignant neoplasm of unspecified site of right female breast: Secondary | ICD-10-CM | POA: Insufficient documentation

## 2023-09-10 DIAGNOSIS — R590 Localized enlarged lymph nodes: Secondary | ICD-10-CM | POA: Insufficient documentation

## 2023-09-10 DIAGNOSIS — N6489 Other specified disorders of breast: Secondary | ICD-10-CM | POA: Insufficient documentation

## 2023-09-10 DIAGNOSIS — R234 Changes in skin texture: Secondary | ICD-10-CM | POA: Diagnosis not present

## 2023-09-10 DIAGNOSIS — Z171 Estrogen receptor negative status [ER-]: Secondary | ICD-10-CM | POA: Diagnosis not present

## 2023-09-10 LAB — GLUCOSE, CAPILLARY: Glucose-Capillary: 95 mg/dL (ref 70–99)

## 2023-09-10 MED ORDER — FLUDEOXYGLUCOSE F - 18 (FDG) INJECTION
12.0300 | Freq: Once | INTRAVENOUS | Status: AC | PRN
  Administered 2023-09-10: 12.03 via INTRAVENOUS

## 2023-09-14 ENCOUNTER — Ambulatory Visit
Admission: RE | Admit: 2023-09-14 | Discharge: 2023-09-14 | Disposition: A | Discharge status: Home / Self Care | Source: Ambulatory Visit | Attending: Oncology | Admitting: Oncology

## 2023-09-14 DIAGNOSIS — C50911 Malignant neoplasm of unspecified site of right female breast: Secondary | ICD-10-CM | POA: Insufficient documentation

## 2023-09-14 DIAGNOSIS — Z171 Estrogen receptor negative status [ER-]: Secondary | ICD-10-CM | POA: Insufficient documentation

## 2023-09-14 MED ORDER — GADOBUTROL 1 MMOL/ML IV SOLN
10.0000 mL | Freq: Once | INTRAVENOUS | Status: AC | PRN
  Administered 2023-09-14 (×2): 10 mL via INTRAVENOUS

## 2023-09-15 ENCOUNTER — Encounter

## 2023-09-15 ENCOUNTER — Ambulatory Visit: Admitting: Oncology

## 2023-09-15 ENCOUNTER — Other Ambulatory Visit: Payer: Self-pay | Admitting: Oncology

## 2023-09-15 DIAGNOSIS — C50812 Malignant neoplasm of overlapping sites of left female breast: Secondary | ICD-10-CM

## 2023-09-15 MED ORDER — DEXAMETHASONE 4 MG PO TABS
ORAL_TABLET | ORAL | 1 refills | Status: AC
Start: 2023-09-15 — End: ?

## 2023-09-15 MED ORDER — PROCHLORPERAZINE MALEATE 10 MG PO TABS
10.0000 mg | ORAL_TABLET | Freq: Four times a day (QID) | ORAL | 1 refills | Status: AC | PRN
Start: 2023-09-15 — End: ?

## 2023-09-15 MED ORDER — ONDANSETRON HCL 8 MG PO TABS
8.0000 mg | ORAL_TABLET | Freq: Three times a day (TID) | ORAL | 1 refills | Status: AC | PRN
Start: 2023-09-15 — End: ?

## 2023-09-15 NOTE — Progress Notes (Signed)
 DISCONTINUE ON PATHWAY REGIMEN - Breast     A cycle is every 21 days:     Fam-trastuzumab  deruxtecan-nxki   **Always confirm dose/schedule in your pharmacy ordering system**  PRIOR TREATMENT: [id number]: Fam-trastuzumab  Deruxtecan 5.4 mg/kg q21 Days  START ON PATHWAY REGIMEN - Breast     A cycle is every 21 days:     Fam-trastuzumab  deruxtecan-nxki   **Always confirm dose/schedule in your pharmacy ordering system**  Patient Characteristics: Distant Metastases or Locoregional Recurrent Disease - Unresected, M0 or Locally Advanced Unresectable Disease Progressing after Neoadjuvant and Local Therapies, M0, HER2 Positive, ER Negative, Chemotherapy, Second Line Therapeutic Status: Distant Metastases HER2 Status: Positive (+) ER Status: Negative (-) PR Status: Negative (-) Line of Therapy: Second Line Intent of Therapy: Non-Curative / Palliative Intent, Discussed with Patient

## 2023-09-16 ENCOUNTER — Other Ambulatory Visit: Payer: Self-pay | Admitting: Oncology

## 2023-09-16 ENCOUNTER — Telehealth: Payer: Self-pay | Admitting: Oncology

## 2023-09-16 NOTE — Telephone Encounter (Signed)
 Left vm for pt that MUGA scan was scheduled sooner. Date and time provided. Mailing out AVS as well.

## 2023-09-16 NOTE — Progress Notes (Signed)
 Pharmacist Chemotherapy Monitoring - Initial Assessment    Anticipated start date: 09/27/23   The following has been reviewed per standard work regarding the patient's treatment regimen: The patient's diagnosis, treatment plan and drug doses, and organ/hematologic function Lab orders and baseline tests specific to treatment regimen  The treatment plan start date, drug sequencing, and pre-medications Prior authorization status  Patient's documented medication list, including drug-drug interaction screen and prescriptions for anti-emetics and supportive care specific to the treatment regimen The drug concentrations, fluid compatibility, administration routes, and timing of the medications to be used The patient's access for treatment and lifetime cumulative dose history, if applicable  The patient's medication allergies and previous infusion related reactions, if applicable  ER 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 recommend systemic palliative chemotherapy-  Fam-trastuzumab  deruxtecan- Enhertu       Vanessa Fisher, RPH, 09/16/2023  11:24 AM

## 2023-09-21 ENCOUNTER — Telehealth: Payer: Self-pay | Admitting: Oncology

## 2023-09-21 NOTE — Telephone Encounter (Signed)
 Pt called and said she had a painful spot on her breast that she needed Dr Babara to look at. Transferred pt call to triage and sent secure chat to Dr Babara clinical team - [facility]

## 2023-09-22 ENCOUNTER — Telehealth: Payer: Self-pay | Admitting: *Deleted

## 2023-09-22 NOTE — Telephone Encounter (Signed)
 Called patient and no answer. Left detailed message letting her know that the ulcer from left breast is from the cancer and the only way for it to start healing is to start her chemotherapy, which is scheduled for Monday. Dr. Cesar evaluated site yesterday and discussed findings with Dr. Babara. If pt really wants site looked at, Dr. Babara recommends [facility].

## 2023-09-22 NOTE — Telephone Encounter (Signed)
 Patient called today and said that the sore on the left breast is not healing.  He has an appointment for next Monday but she would like to see if Dr. Babara has an appointment tomorrow after 11 AM if the has a slot .  I see in the notes of the surgeon from yesterday says he has seroma.

## 2023-09-23 ENCOUNTER — Encounter
Admission: RE | Admit: 2023-09-23 | Discharge: 2023-09-23 | Disposition: A | Discharge status: Home / Self Care | Source: Ambulatory Visit | Attending: Oncology | Admitting: Oncology

## 2023-09-23 ENCOUNTER — Encounter: Payer: Self-pay | Admitting: Oncology

## 2023-09-23 DIAGNOSIS — Z79899 Other long term (current) drug therapy: Secondary | ICD-10-CM | POA: Insufficient documentation

## 2023-09-23 DIAGNOSIS — Z01818 Encounter for other preprocedural examination: Secondary | ICD-10-CM | POA: Diagnosis not present

## 2023-09-23 DIAGNOSIS — Z171 Estrogen receptor negative status [ER-]: Secondary | ICD-10-CM | POA: Insufficient documentation

## 2023-09-23 DIAGNOSIS — C50911 Malignant neoplasm of unspecified site of right female breast: Secondary | ICD-10-CM | POA: Insufficient documentation

## 2023-09-23 MED ORDER — TECHNETIUM TC 99M-LABELED RED BLOOD CELLS IV KIT
22.0000 | PACK | Freq: Once | INTRAVENOUS | Status: AC | PRN
  Administered 2023-09-23: 21.57 via INTRAVENOUS

## 2023-09-24 MED FILL — Fosaprepitant Dimeglumine For IV Infusion 150 MG (Base Eq): INTRAVENOUS | Qty: 5 | Status: AC

## 2023-09-27 ENCOUNTER — Encounter

## 2023-09-27 ENCOUNTER — Inpatient Hospital Stay

## 2023-09-27 ENCOUNTER — Encounter: Payer: Self-pay | Admitting: Oncology

## 2023-09-27 ENCOUNTER — Inpatient Hospital Stay (HOSPITAL_BASED_OUTPATIENT_CLINIC_OR_DEPARTMENT_OTHER): Admitting: Oncology

## 2023-09-27 VITALS — BP 132/62 | HR 65 | Temp 97.4°F | Resp 18 | Wt 238.3 lb

## 2023-09-27 VITALS — BP 105/54 | HR 70

## 2023-09-27 DIAGNOSIS — Z171 Estrogen receptor negative status [ER-]: Secondary | ICD-10-CM

## 2023-09-27 DIAGNOSIS — C50812 Malignant neoplasm of overlapping sites of left female breast: Secondary | ICD-10-CM

## 2023-09-27 DIAGNOSIS — Z5112 Encounter for antineoplastic immunotherapy: Secondary | ICD-10-CM | POA: Diagnosis not present

## 2023-09-27 DIAGNOSIS — D649 Anemia, unspecified: Secondary | ICD-10-CM | POA: Diagnosis not present

## 2023-09-27 DIAGNOSIS — Z5111 Encounter for antineoplastic chemotherapy: Secondary | ICD-10-CM

## 2023-09-27 LAB — CBC WITH DIFFERENTIAL (CANCER CENTER ONLY)
Abs Immature Granulocytes: 0.03 K/uL (ref 0.00–0.07)
Basophils Absolute: 0 K/uL (ref 0.0–0.1)
Basophils Relative: 1 %
Eosinophils Absolute: 0.1 K/uL (ref 0.0–0.5)
Eosinophils Relative: 1 %
HCT: 33.3 % — ABNORMAL LOW (ref 36.0–46.0)
Hemoglobin: 10.5 g/dL — ABNORMAL LOW (ref 12.0–15.0)
Immature Granulocytes: 1 %
Lymphocytes Relative: 26 %
Lymphs Abs: 1.5 K/uL (ref 0.7–4.0)
MCH: 25.7 pg — ABNORMAL LOW (ref 26.0–34.0)
MCHC: 31.5 g/dL (ref 30.0–36.0)
MCV: 81.4 fL (ref 80.0–100.0)
Monocytes Absolute: 0.5 K/uL (ref 0.1–1.0)
Monocytes Relative: 8 %
Neutro Abs: 3.7 K/uL (ref 1.7–7.7)
Neutrophils Relative %: 63 %
Platelet Count: 275 K/uL (ref 150–400)
RBC: 4.09 MIL/uL (ref 3.87–5.11)
RDW: 15.5 % (ref 11.5–15.5)
WBC Count: 5.7 K/uL (ref 4.0–10.5)
nRBC: 0 % (ref 0.0–0.2)

## 2023-09-27 LAB — CANCER ANTIGEN 27.29: CA 27.29: 26 U/mL (ref 0.0–38.6)

## 2023-09-27 LAB — CMP (CANCER CENTER ONLY)
ALT: 15 U/L (ref 0–44)
AST: 16 U/L (ref 15–41)
Albumin: 3.5 g/dL (ref 3.5–5.0)
Alkaline Phosphatase: 97 U/L (ref 38–126)
Anion gap: 9 (ref 5–15)
BUN: 13 mg/dL (ref 8–23)
CO2: 25 mmol/L (ref 22–32)
Calcium: 8.8 mg/dL — ABNORMAL LOW (ref 8.9–10.3)
Chloride: 101 mmol/L (ref 98–111)
Creatinine: 0.73 mg/dL (ref 0.44–1.00)
GFR, Estimated: >60 mL/min (ref >60)
Glucose, Bld: 113 mg/dL — ABNORMAL HIGH (ref 70–99)
Potassium: 3.8 mmol/L (ref 3.5–5.1)
Sodium: 135 mmol/L (ref 135–145)
Total Bilirubin: 0.5 mg/dL (ref 0.0–1.2)
Total Protein: 7.1 g/dL (ref 6.5–8.1)

## 2023-09-27 MED ORDER — DEXAMETHASONE SODIUM PHOSPHATE 10 MG/ML IJ SOLN
10.0000 mg | Freq: Once | INTRAMUSCULAR | Status: AC
  Administered 2023-09-27: 10 mg via INTRAVENOUS
  Filled 2023-09-27: qty 1

## 2023-09-27 MED ORDER — DIPHENHYDRAMINE HCL 25 MG PO CAPS
50.0000 mg | ORAL_CAPSULE | Freq: Once | ORAL | Status: AC
  Administered 2023-09-27: 50 mg via ORAL
  Filled 2023-09-27: qty 2

## 2023-09-27 MED ORDER — SODIUM CHLORIDE 0.9 % IV SOLN
150.0000 mg | Freq: Once | INTRAVENOUS | Status: AC
  Administered 2023-09-27: 150 mg via INTRAVENOUS
  Filled 2023-09-27: qty 150

## 2023-09-27 MED ORDER — ACETAMINOPHEN 325 MG PO TABS
650.0000 mg | ORAL_TABLET | Freq: Once | ORAL | Status: AC
  Administered 2023-09-27: 650 mg via ORAL
  Filled 2023-09-27: qty 2

## 2023-09-27 MED ORDER — LIDOCAINE-PRILOCAINE 2.5-2.5 % EX CREA
1.0000 | TOPICAL_CREAM | CUTANEOUS | 12 refills | Status: AC | PRN
Start: 2023-09-27 — End: ?

## 2023-09-27 MED ORDER — FAM-TRASTUZUMAB DERUXTECAN-NXKI CHEMO 100 MG IV SOLR
5.4000 mg/kg | Freq: Once | INTRAVENOUS | Status: AC
  Administered 2023-09-27: 600 mg via INTRAVENOUS
  Filled 2023-09-27: qty 30

## 2023-09-27 MED ORDER — PALONOSETRON HCL INJECTION 0.25 MG/5ML
0.2500 mg | Freq: Once | INTRAVENOUS | Status: AC
  Administered 2023-09-27: 0.25 mg via INTRAVENOUS
  Filled 2023-09-27: qty 5

## 2023-09-27 MED ORDER — DEXTROSE 5 % IV SOLN
INTRAVENOUS | Status: DC
  Filled 2023-09-27: qty 250

## 2023-09-27 NOTE — Patient Instructions (Signed)

## 2023-09-27 NOTE — Assessment & Plan Note (Signed)
 Recommend patient to take calcium 1200mg  daily, otc supply.  Take vitamin D supplementation.

## 2023-09-27 NOTE — Assessment & Plan Note (Signed)
 Hemoglobin is stable. Monitor counts. She may stop iron  supplementation due to constipation.

## 2023-09-27 NOTE — Assessment & Plan Note (Addendum)
 Left breast cancer, cT4b N3 Mx, ER/PR-, HER2 + S/p neoadjuvant chemotherapy 6 cycles of TCHP  S/p left lumpectomy  + SLNB -ypT0 ypN0, complete pathological response.  S/p adjuvant Transtuzumab and Pertuzumab  11 cycles. S/p adjuvant radiation-  Declined adjuvant Zometa. Recurrent Stage IV breast cancer. 08/27/2023 left breast skin punch biopsy showed ER+ PR + HER2 (3+) breast carcinoma 09/08/2023, right axillary lymph node biopsy showed .  ER- PR-  HER2 (3+)  breast carcinoma  PET scan showed recurrence in bilateral breast and lymphadenopathy. MRI brain negative.   Recommend Fam-trastuzumab  deruxtecan- Enhertu , Rationale and side effects were reviewed with patient.   Labs are reviewed and discussed with patient. Proceed with cycle 1 treatments.   LVEF has decreased comparing to prior.  Refer to cardiology for further evaluation.

## 2023-09-27 NOTE — Progress Notes (Signed)
 Pt here for follow up. Pt reports that she continues to have pain to left breast and ulcer is still present

## 2023-09-27 NOTE — Progress Notes (Signed)
 Hematology/Oncology Progress note [phone] [phone]     CHIEF COMPLAINTS/REASON FOR VISIT:  Follow-up for Stage IV breast cancer, ER-,PR- HER2+ and ER-, PR-, HER2-  ASSESSMENT & PLAN:   Cancer Staging  Breast cancer (HCC) Staging form: Breast, AJCC 8th Edition - Clinical stage from 08/21/2021: Stage IIIB (cT4b, cN3c, cM0, G3, ER-, PR-, HER2+) - Signed by [name], MD on 09/12/2021  Breast cancer (HCC) Left breast cancer, cT4b N3 Mx, ER/PR-, HER2 + S/p neoadjuvant chemotherapy 6 cycles of TCHP  S/p left lumpectomy  + SLNB -ypT0 ypN0, complete pathological response.  S/p adjuvant Transtuzumab and Pertuzumab  11 cycles. S/p adjuvant radiation-  Declined adjuvant Zometa. Recurrent Stage IV breast cancer. 08/27/2023 left breast skin punch biopsy showed ER+ PR + HER2 (3+) breast carcinoma 09/08/2023, right axillary lymph node biopsy showed .  ER- PR-  HER2 (3+)  breast carcinoma  PET scan showed recurrence in bilateral breast and lymphadenopathy. MRI brain negative.   Recommend Fam-trastuzumab  deruxtecan- Enhertu , Rationale and side effects were reviewed with patient.   Labs are reviewed and discussed with patient. Proceed with cycle 1 treatments.   LVEF has decreased comparing to prior.  Refer to cardiology for further evaluation.   Hypocalcemia Recommend patient to take calcium  1200mg  daily, otc supply.  Take vitamin D  supplementation.   Normocytic anemia Hemoglobin is stable. Monitor counts. She may stop iron  supplementation due to constipation.      Orders Placed This Encounter  Procedures   Cancer antigen 27.29    Standing Status:   Future    Expected Date:   10/18/2023    Expiration Date:   10/17/2024   Cancer antigen 15-3    Standing Status:   Future    Expected Date:   10/18/2023    Expiration Date:   10/17/2024   CBC with Differential (Cancer Center Only)    Standing Status:   Future    Expected Date:   10/18/2023    Expiration Date:    10/17/2024   CMP (Cancer Center only)    Standing Status:   Future    Expected Date:   10/18/2023    Expiration Date:   10/17/2024   Ambulatory referral to Cardiology    Referral Priority:   Routine    Referral Type:   Consultation    Referral Reason:   Specialty Services Required    Number of Visits Requested:   1   Ambulatory Referral to Palliative Care    Referral Priority:   Routine    Referral Type:   Consultation    Number of Visits Requested:   1   Follow up 3 weeks  We spent sufficient time to discuss many aspect of care, questions were answered to patient's satisfaction.  Call Babara, MD, PhD [facility] Hematology Oncology 09/27/2023      HISTORY OF PRESENTING ILLNESS:  Vanessa Fisher is a  63 y.o.  female presents for follow up of Stage IV metastatic breast cancer.  SUMMARY OF ONCOLOGIC HISTORY: Oncology History  Breast cancer (HCC)  07/31/2021 Imaging   Bilateral diagnostic mammogram and US  showed 5 centimeter LEFT breast mass associated with pleomorphic calcifications is suspicious for invasive ductal carcinoma.At least 4 LEFT axillary lymph nodes with abnormal morphology   08/21/2021 Cancer Staging   Staging form: Breast, AJCC 8th Edition - Clinical: Stage IIIB (cT4b, cN3c, cM0, G3, ER-, PR-, HER2+) - Signed by [name], MD on 08/28/2021 Histologic grading system: 3 grade system  -08/21/21 left breast ultrasound-guided  biopsy showed invasive mammary carcinoma, grade 3, ER/PR negative, HER2 positive.  Left axillary lymph node biopsy positive for macro metastatic mammary carcinoma, 8 mm in greatest extent.   08/30/2021 Imaging   MRI brain w wo contrast -No metastatic disease or acute intracranial abnormality. Essentially normal for age MRI appearance of the brain.    09/03/2021 Echocardiogram   1. Left ventricular ejection fraction, by estimation, is 55 to 60%. Left ventricular ejection fraction by 3D volume is 58 %. The left ventricle has normal function. The left  ventricle has no regional wall motion abnormalities. There is mild left  ventricular hypertrophy. Left ventricular diastolic parameters were normal.  2. Right ventricular systolic function is normal. The right ventricular size is normal.  3. The mitral valve is normal in structure. No evidence of mitral valve regurgitation.  4. The aortic valve was not well visualized. Aortic valve regurgitation is not visualized   09/10/2021 Imaging   PET scan showed Large hypermetabolic left breast mass and diffuse skin thickening,consistent with primary breast carcinoma. Hypermetabolic lymphadenopathy in the left axilla, left subpectoral region, and left supraclavicular region, consistent with metastatic disease. No evidence of metastatic disease within the abdomen or pelvis   09/12/2021 - 02/13/2022 Chemotherapy   Patient is on Treatment Plan : BREAST  Docetaxel  + Carboplatin  + Trastuzumab  + Pertuzumab   (TCHP) q21d       Genetic Testing   Negative genetic testing. No pathogenic variants identified on the [facility] Common Hereditary Cancers+RNA panel. The report date is 10/26/2021.  The Common Hereditary Cancers Panel + RNA offered by [facility] includes sequencing and/or deletion duplication testing of the following 47 genes: APC, ATM, AXIN2, BARD1, BMPR1A, BRCA1, BRCA2, BRIP1, CDH1, CDKN2A (p14ARF), CDKN2A (p16INK4a), CKD4, CHEK2, CTNNA1, DICER1, EPCAM (Deletion/duplication testing only), GREM1 (promoter region deletion/duplication testing only), KIT, MEN1, MLH1, MSH2, MSH3, MSH6, MUTYH, NBN, NF1, NHTL1, PALB2, PDGFRA, PMS2, POLD1, POLE, PTEN, RAD50, RAD51C, RAD51D, SDHB, SDHC, SDHD, SMAD4, SMARCA4. STK11, TP53, TSC1, TSC2, and VHL.  The following genes were evaluated for sequence changes only: SDHA and HOXB13 c.251G>A variant only.   02/09/2022 Echocardiogram   LVEF 55 to 60%    03/27/2022 Surgery   S/p left mastectomy + SLNB ypT0 ypN0,    04/24/2022 - 12/23/2022 Chemotherapy   Patient is on Treatment Plan :  BREAST Trastuzumab   + Pertuzumab  q21d x 13 cycles     05/06/2022 Echocardiogram   LVEF 55 to 60%    07/02/2022 - 08/03/2022 Radiation Therapy   Adjuvant breast radiation.    10/20/2022 Imaging   Bone scan whole body showed 1. Degenerative uptake at the sternoclavicular joints, shoulders,knees and hindfoot bilaterally. 2. No evidence for metastatic disease to the. 3. Uptake in the left breast may be related to postsurgical change or residual tumor.   07/28/2023 Mammogram   Unilateral left breast mammogram showed  1. Diffuse MEDIAL LEFT breast erythema and pain without interval change in mammographic appearance or suspicious sonographic abnormality. Stable skin thickening in this area is presumably due to prior radiation therapy. Findings are most suspicious for cellulitis. Recommend treatment with antibiotics. If antibiotics fail to resolve the erythema, surgical or dermatologic consultation for skin punch biopsy is recommended to exclude inflammatory breast cancer.   2. Redemonstrated LEFT breast seroma at the lumpectomy site, now with complex internal debris. Patient requests aspiration of the fluid component of this collection, which will be attempted under ultrasound guidance.   08/27/2023 Relapse/Recurrence   08/27/2023, left breast 11:00 punch biopsy showed 1. Breast, biopsy, left  11:00 punch BX :       - INVASIVE GRADE 3 MAMMARY CARCINOMA  INVADING SKIN AND EPIDERMIS WITH       ULCERATION  ER 70% positive, PR 0%, HER2 positive(3+).    09/08/2023 right axilla lymph node biopsy showed 1. Lymph node, needle/core biopsy, right axilla, hydromark coil clip :       ONE LYMPH NODE, POSITIVE FOR METASTATIC CARCINOMA MORPHOLOGICALLY COMPATIBLE       WITH BREAST PRIMARY (1/1).       METASTATIC FOCUS: 11 MM / 1.1 CM.       EXTRANODAL EXTENSION IDENTIFIE    09/01/2023 Imaging   MRI breast bilaterally without contrast showed  1. Widespread abnormal enhancement involving the skin and  nipple of the LEFT breast, involving all 4 quadrants and compatible with known, biopsy proven LEFT breast cancer invading the skin. There is likely slight RETROAREOLAR extension of the nipple involvement extending 8 mm posteriorly from the nipple.   2. Post treatment changes of the LEFT breast without other evidence of recurrent malignancy in the LEFT breast. Thin peripheral enhancement at the lumpectomy site is nonspecific but favored to be due to recent instrumentation.   3. Markedly enlarged RIGHT axillary level 1 lymph nodes, for which second-look ultrasound and ultrasound-guided biopsy is recommended. Small but asymmetric RIGHT axillary level 2 lymph nodes are also suspicious for malignancy but not amenable to percutaneous sampling.   4. Indeterminate RIGHT breast focus at the 9 o'clock periareolar position anterior depth measuring 3 mm and indeterminate RIGHT breast mass at the 9 o'clock central position at middle depth measuring 5 mm. If management would be impacted, MRI guided biopsies of the focus and mass at 9 o'clock in the RIGHT breast is recommended.   5. Confluent enhancement along the LEFT internal mammary vessels which could reflect ectatic vascular structures or an enlarged LEFT internal mammary lymph node. Recommend attention on forthcoming PET-CT.     09/10/2023 - 09/10/2023 Chemotherapy   Patient is on Treatment Plan : BREAST Fam-Trastuzumab Deruxtecan-nxki  (Enhertu ) (5.4) q21d     09/10/2023 Imaging   PET scan showed  1. Significant hypermetabolism involving skin thickening of the left breast laterally worrisome for recurrent breast cancer. 2. Extensive hypermetabolic right axillary and subpectoral lymphadenopathy consistent with metastatic nodal disease. 3. Hypermetabolic left internal mammary lymphadenopathy. 4. No findings for abdominal/pelvic metastatic disease or osseous metastatic disease. 5. Large fluid collection in the left breast containing  gas suggesting a postoperative fluid collection or abscess. 6. Benign hypermetabolic/brown fat in the neck and chest area.   09/27/2023 -  Chemotherapy   Patient is on Treatment Plan : BREAST Fam-Trastuzumab Deruxtecan-nxki  (Enhertu ) (5.4) q21d      + chronic nasal pressure, congestion, horse voice she attributes to sinusitis.   INTERVAL HISTORY BRINKLEE CISSE is a 63 y.o. female who has above history reviewed by me today presents for follow up visit for recurrent HER2 positive breast cancer  -  left breast persistent swelling with drainage in the site of previous biopsy.  MEDICAL HISTORY:  Past Medical History:  Diagnosis Date   Anemia    Anxiety    Arthritis    Asthma    WELL CONTROLLED   Bile acid malabsorption syndrome    Bradycardia    HAD AN ISSUE WITH THIS IN 2016-NO PROBLEMS SINCE   Breast cancer, left breast (HCC) 08/2021   Carpal tunnel syndrome on right    Depression    Diabetes mellitus without complication (HCC)  Diverticulitis    Gastric reflux    GERD (gastroesophageal reflux disease)    Hand pain 05/12/2016   Headache    H/O MIGRAINES   History of kidney stones    H/O   Lumbar radiculitis    Neck pain, chronic    Osteoarthritis of both knees    Panic attacks    Personal history of chemotherapy    Personal history of radiation therapy     SURGICAL HISTORY: Past Surgical History:  Procedure Laterality Date   BREAST BIOPSY Left 08/21/2021   Axilla Bx, Hydromarker, neg   BREAST BIOPSY Left 08/21/2021   US  Bx, Ribbon Clip, invasive mammary carcinoma   BREAST BIOPSY Left 03/12/2022   US  LT RADIO FREQUENCY TAG LOC US  GUIDE 03/12/2022 [facility]   BREAST BIOPSY Left 03/12/2022   US  LT RADIO FREQUENCY TAG EA ADD LESION LOC US  GUIDE 03/12/2022 [facility]   BREAST LUMPECTOMY Left 03/27/2022   CARPAL TUNNEL RELEASE Right 12/06/2017   Procedure: CARPAL TUNNEL RELEASE;  Surgeon: Cleotilde Barrio, MD;  Location: [facility];  Service: Orthopedics;   Laterality: Right;   COLONOSCOPY WITH PROPOFOL  N/A 06/11/2021   Procedure: COLONOSCOPY WITH PROPOFOL ;  Surgeon: Unk Corinn Skiff, MD;  Location[facility]COPY;  Service: Gastroenterology;  Laterality: N/A;   DORSAL COMPARTMENT RELEASE Right 12/06/2017   Procedure: RELEASE DORSAL COMPARTMENT (DEQUERVAIN);  Surgeon: Cleotilde Barrio, MD;  Location: [facility];  Service: Orthopedics;  Laterality: Right;   ESOPHAGOGASTRODUODENOSCOPY (EGD) WITH PROPOFOL  N/A 06/11/2021   Procedure: ESOPHAGOGASTRODUODENOSCOPY (EGD) WITH PROPOFOL ;  Surgeon: Unk Corinn Skiff, MD;  Location: [facility] ENDOSCOPY;  Service: Gastroenterology;  Laterality: N/A;   INCISION AND DRAINAGE, ABSCESS, BREAST Left 08/27/2023   Procedure: INCISION AND DRAINAGE, ABSCESS, BREAST;  Surgeon: Rodolph Romano, MD;  Location: [facility];  Service: General;  Laterality: Left;   PART MASTECTOMY,RADIO FREQUENCY LOCALIZER,AXILLARY SENTINEL NODE BIOPSY Left 03/27/2022   Procedure: PART MASTECTOMY,RADIO FREQUENCY LOCALIZER,AXILLARY SENTINEL NODE BIOPSY;  Surgeon: Rodolph Romano, MD;  Location: [facility];  Service: General;  Laterality: Left;   PARTIAL HYSTERECTOMY     PORTACATH PLACEMENT N/A 09/24/2021   Procedure: INSERTION PORT-A-CATH;  Surgeon: Rodolph Romano, MD;  Location: [facility];  Service: General;  Laterality: N/A;   TUBAL LIGATION      SOCIAL HISTORY: Social History   Socioeconomic History   Marital status: Single    Spouse name: Not on file   Number of children: Not on file   Years of education: Not on file   Highest education level: Not on file  Occupational History   Not on file  Tobacco Use   Smoking status: Former    Types: Cigarettes   Smokeless tobacco: Never  Vaping Use   Vaping status: Never Used  Substance and Sexual Activity   Alcohol use: No   Drug use: No   Sexual activity: Not on file  Other Topics Concern   Not on file  Social History Narrative   Lives alone   Social Drivers of Health    Financial Resource Strain: Low Risk  (09/24/2023)   Received [facility]   Overall Financial Resource Strain (CARDIA)    Difficulty of Paying Living Expenses: Not hard at all  Food Insecurity: Food Insecurity Present (09/24/2023)   Received from [facility]   Hunger Vital Sign    Within the past 12 months, you worried that your food would run out before you got the money to buy more.: Never true    Within the past  12 months, the food you bought just didn't last and you didn't have money to get more.: Sometimes true  Transportation Needs: No Transportation Needs (09/24/2023)   Received [facility] - Transportation    In the past 12 months, has lack of transportation kept you from medical appointments or from getting medications?: No    Lack of Transportation (Non-Medical): No  Physical Activity: Inactive (08/15/2021)   Exercise Vital Sign    Days of Exercise per Week: 0 days    Minutes of Exercise per Session: 0 min  Stress: Stress Concern Present (08/15/2021)   [employer] of Occupational Health - Occupational Stress Questionnaire    Feeling of Stress : Rather much  Social Connections: Socially Isolated (08/15/2021)   Social Connection and Isolation Panel    Frequency of Communication with Friends and Family: Once a week    Frequency of Social Gatherings with Friends and Family: Once a week    Attends Religious Services: Never    Database administrator or Organizations: No    Attends Engineer, structural: Not on file    Marital Status: Never married  Intimate Partner Violence: Not At Risk (08/15/2021)   Humiliation, Afraid, Rape, and Kick questionnaire    Fear of Current or Ex-Partner: No    Emotionally Abused: No    Physically Abused: No    Sexually Abused: No    FAMILY HISTORY: Family History  Problem Relation Age of Onset   Diabetes Mother    Hypertension Mother    Heart failure Mother     Pancreatic cancer Mother 20   Diabetes Father    Hypertension Father    Breast cancer Maternal Aunt    Cervical cancer Maternal Aunt    Lung cancer Son 29    ALLERGIES:  is allergic to bc powder [aspirin-salicylamide-caffeine], doxycycline , gabapentin , hydrocodone -acetaminophen , latex, penicillin g, penicillins, and shellfish allergy.  MEDICATIONS:  Current Outpatient Medications  Medication Sig Dispense Refill   albuterol  (PROVENTIL ) (2.5 MG/3ML) 0.083% nebulizer solution Take 3 mLs (2.5 mg total) by nebulization every 4 (four) hours as needed for wheezing or shortness of breath. 75 mL 2   albuterol  (VENTOLIN  HFA) 108 (90 Base) MCG/ACT inhaler Inhale 2 puffs into the lungs every 6 (six) hours as needed for wheezing or shortness of breath. 18 g 0   allopurinol (ZYLOPRIM) 100 MG tablet Take 100 mg by mouth daily.     atorvastatin (LIPITOR) 10 MG tablet Take 10 mg by mouth daily.     Azelastine  HCl 137 MCG/SPRAY SOLN Place 1 spray into the nose daily. 30 mL 0   calcium  carbonate (OS-CAL - DOSED IN MG OF ELEMENTAL CALCIUM ) 1250 (500 Ca) MG tablet Take 1 tablet (1,250 mg total) by mouth daily. 90 tablet 1   celecoxib (CELEBREX) 200 MG capsule Take by mouth 2 (two) times daily.     cetirizine  (ZYRTEC  ALLERGY) 10 MG tablet Take 1 tablet (10 mg total) by mouth daily. 90 tablet 0   cyclobenzaprine  (FLEXERIL ) 10 MG tablet Take 10 mg by mouth 3 (three) times daily.     dexamethasone  (DECADRON ) 4 MG tablet Take 2 tablets (8 mg) by mouth daily for 3 days starting the day after chemotherapy. Take with food. 30 tablet 1   diclofenac Sodium (VOLTAREN) 1 % GEL Apply topically.     diphenoxylate -atropine  (LOMOTIL ) 2.5-0.025 MG tablet Take 1 tablet by mouth 4 (four) times daily as needed for diarrhea or loose stools. 120 tablet  0   docusate sodium  (COLACE) 100 MG capsule Take 1 capsule (100 mg total) by mouth 2 (two) times daily.     fluticasone  (FLONASE ) 50 MCG/ACT nasal spray Place 2 sprays into both  nostrils daily. 15.8 mL 0   Iron -Vitamin C  65-125 MG TABS Take 1 tablet by mouth daily at 2 PM. 30 tablet 2   KLOR-CON  M20 20 MEQ tablet TAKE 1 TABLET BY MOUTH TWICE A DAY 60 tablet 1   magnesium  chloride (SLOW-MAG) 64 MG TBEC SR tablet Take 1 tablet (64 mg total) by mouth 2 (two) times daily. 60 tablet 2   ondansetron  (ZOFRAN ) 8 MG tablet Take 1 tablet (8 mg total) by mouth every 8 (eight) hours as needed for nausea or vomiting. Start on the third day after chemotherapy. 30 tablet 1   oxyCODONE  HCl 10 MG TABA Take 10 mg by mouth every 4 (four) hours as needed. 14 tablet 0   pantoprazole (PROTONIX) 20 MG tablet Take 20 mg by mouth daily.     pregabalin (LYRICA) 50 MG capsule Take 50 mg by mouth 3 (three) times daily.     prochlorperazine  (COMPAZINE ) 10 MG tablet Take 1 tablet (10 mg total) by mouth every 6 (six) hours as needed for nausea or vomiting. 30 tablet 1   escitalopram  (LEXAPRO ) 10 MG tablet Take 1 tablet (10 mg total) by mouth daily. (Patient not taking: Reported on 09/27/2023) 30 tablet 1   fluconazole  (DIFLUCAN ) 150 MG tablet Take 1 tablet (150 mg total) by mouth once a week. (Patient not taking: Reported on 09/27/2023) 2 tablet 0   lidocaine -prilocaine  (EMLA ) cream Apply 1 Application topically as needed. Apply small amount to port and cover with saran wrap 1-2 hours prior to port access 30 g 12   naloxone (NARCAN) nasal spray 4 mg/0.1 mL  (Patient not taking: Reported on 09/27/2023)     No current facility-administered medications for this visit.   Facility-Administered Medications Ordered in Other Visits  Medication Dose Route Frequency Provider Last Rate Last Admin   dextrose  5 % solution   Intravenous Continuous [name], MD   Stopped at 09/27/23 1533   magnesium  sulfate IVPB 2 g 50 mL  2 g Intravenous Once Covington, Sarah M, PA-C        Review of Systems  Constitutional:  Negative for appetite change, chills, fatigue and fever.  HENT:   Negative for hearing loss and voice  change.   Eyes:  Negative for eye problems.  Respiratory:  Negative for chest tightness and cough.   Cardiovascular:  Negative for chest pain.  Gastrointestinal:  Negative for abdominal distention, abdominal pain, blood in stool and diarrhea.  Endocrine: Negative for hot flashes.  Genitourinary:  Negative for difficulty urinating and frequency.   Musculoskeletal:  Positive for arthralgias and back pain.       Leg cramps  Skin:  Negative for itching and rash.  Neurological:  Negative for extremity weakness and headaches.  Hematological:  Negative for adenopathy.  Psychiatric/Behavioral:  Negative for confusion.   Left breast pain   PHYSICAL EXAMINATION: ECOG PERFORMANCE STATUS: 1 - Symptomatic but completely ambulatory Vitals:   09/27/23 1140 09/27/23 1144  BP: (!) 151/114 132/62  Pulse: (!) 52 65  Resp: 18   Temp: (!) 97.4 F (36.3 C)    Filed Weights   09/27/23 1140  Weight: 238 lb 4.8 oz (108.1 kg)    Physical Exam Constitutional:      General: She is not in acute distress.  Appearance: She is not diaphoretic.  HENT:     Head: Normocephalic and atraumatic.  Eyes:     General: No scleral icterus. Cardiovascular:     Rate and Rhythm: Normal rate and regular rhythm.     Heart sounds: No murmur heard. Pulmonary:     Effort: Pulmonary effort is normal. No respiratory distress.     Breath sounds: No wheezing.  Abdominal:     General: There is no distension.     Palpations: Abdomen is soft.     Tenderness: There is no abdominal tenderness.  Musculoskeletal:        General: Normal range of motion.     Cervical back: Normal range of motion and neck supple.  Skin:    General: Skin is warm and dry.     Findings: No erythema.  Neurological:     Mental Status: She is alert and oriented to person, place, and time.     Cranial Nerves: No cranial nerve deficit.     Motor: No abnormal muscle tone.     Coordination: Coordination normal.  Psychiatric:        Mood and  Affect: Affect normal.     Comments: Anxious    Left breast is warm and swollen,  with a palpable, firm mass at the 3 o'clock position, drainage at the previous biopsy sites.   LABORATORY DATA:  I have reviewed the data as listed    Latest Ref Rng & Units 09/27/2023   10:48 AM 08/27/2023   10:36 AM 07/20/2023   10:34 AM  CBC  WBC 4.0 - 10.5 K/uL 5.7   6.6   Hemoglobin 12.0 - 15.0 g/dL 89.4  88.3  89.6   Hematocrit 36.0 - 46.0 % 33.3  34.0  33.2   Platelets 150 - 400 K/uL 275   272       Latest Ref Rng & Units 09/27/2023   10:48 AM 08/27/2023   10:36 AM 07/20/2023   10:34 AM  CMP  Glucose 70 - 99 mg/dL 886  882  858   BUN 8 - 23 mg/dL 13  13  14    Creatinine 0.44 - 1.00 mg/dL 9.26  9.09  9.06   Sodium 135 - 145 mmol/L 135  138  136   Potassium 3.5 - 5.1 mmol/L 3.8  4.0  3.6   Chloride 98 - 111 mmol/L 101  104  104   CO2 22 - 32 mmol/L 25   24   Calcium  8.9 - 10.3 mg/dL 8.8   8.6   Total Protein 6.5 - 8.1 g/dL 7.1   7.1   Total Bilirubin 0.0 - 1.2 mg/dL 0.5   0.7   Alkaline Phos 38 - 126 U/L 97   99   AST 15 - 41 U/L 16   18   ALT 0 - 44 U/L 15   16       RADIOGRAPHIC STUDIES: I have personally reviewed the radiological images as listed and agreed with the findings in the report. NM Cardiac Muga Rest Result Date: 09/23/2023 CLINICAL DATA:  Breast cancer. Evaluate cardiac function in relation to chemotherapy. EXAM: NUCLEAR MEDICINE CARDIAC BLOOD POOL IMAGING (MUGA) TECHNIQUE: Cardiac multi-gated acquisition was performed at rest following intravenous injection of Tc-51m labeled red blood cells. RADIOPHARMACEUTICALS:  21.6 mCi Tc-72m pertechnetate in-vitro labeled red blood cells IV COMPARISON:  None Available. FINDINGS: No  focal wall motion abnormality of the left ventricle. Calculated left ventricular ejection fraction equals 52.6% IMPRESSION: Left ventricular  ejection fraction equals52.6 %. Electronically Signed   By: Jackquline Boxer M.D.   On: 09/23/2023 12:26   NM PET  Image Restag (PS) Skull Base To Thigh Result Date: 09/14/2023 CLINICAL DATA:  Subsequent treatment strategy for history of left breast cancer. Recent biopsy of right axillary lymphadenopathy. EXAM: NUCLEAR MEDICINE PET SKULL BASE TO THIGH TECHNIQUE: 12.03 mCi F-18 FDG was injected intravenously. Full-ring PET imaging was performed from the skull base to thigh after the radiotracer. CT data was obtained and used for attenuation correction and anatomic localization. Fasting blood glucose: 95 mg/dl COMPARISON:  Prior PET-CT 09/10/2021. FINDINGS: Mediastinal blood pool activity: SUV max 3.5 Liver activity: SUV max NA NECK: No hypermetabolic lymph nodes in the neck. Incidental CT findings: None. CHEST: Large fluid collection in the left breast containing gas suggesting a postoperative fluid collection or abscess. Significant hypermetabolism involving skin thickening of the left breast laterally with SUV max of 17.0. Findings worrisome for recurrent breast cancer. No enlarged or hypermetabolic left axillary lymph nodes. Extensive right axillary and subpectoral lymphadenopathy demonstrating marked hypermetabolism and consistent with metastatic nodal disease. The largest node measures 18 mm and SUV max is 29.3. Left-sided internal mammary adenopathy is hypermetabolic with SUV max of 21.0. No mediastinal or hilar adenopathy. No hypermetabolic pulmonary lesions. 4 mm subpleural nodule in the right upper lobe is unchanged since 2023 and considered benign. Incidental CT findings: Extensive hypermetabolic brown fat in the neck and supraclavicular fossa. The right IJ Port-A-Cath is in good position without complicating features. ABDOMEN/PELVIS: No findings suspicious for abdominal/pelvic metastatic disease. The liver, spleen, adrenal glands and kidneys are unremarkable. No abdominal or pelvic lymphadenopathy. Incidental CT findings: Minimal scattered vascular calcifications. Simple right ovarian cyst measuring 17 mm not  requiring any further imaging evaluation or follow-up. SKELETON: No findings suspicious for osseous metastatic disease. Incidental CT findings: Stable mixed lytic and sclerotic lesion involving the right iliac bone likely a benign finding. IMPRESSION: 1. Significant hypermetabolism involving skin thickening of the left breast laterally worrisome for recurrent breast cancer. 2. Extensive hypermetabolic right axillary and subpectoral lymphadenopathy consistent with metastatic nodal disease. 3. Hypermetabolic left internal mammary lymphadenopathy. 4. No findings for abdominal/pelvic metastatic disease or osseous metastatic disease. 5. Large fluid collection in the left breast containing gas suggesting a postoperative fluid collection or abscess. 6. Benign hypermetabolic/brown fat in the neck and chest area. Electronically Signed   By: MYRTIS Stammer M.D.   On: 09/14/2023 22:57   MR Brain W Wo Contrast Result Date: 09/14/2023 CLINICAL DATA:  63 year old female with recurrent breast cancer. Staging. EXAM: MRI HEAD WITHOUT AND WITH CONTRAST TECHNIQUE: Multiplanar, multiecho pulse sequences of the brain and surrounding structures were obtained without and with intravenous contrast. CONTRAST:  10mL GADAVIST  GADOBUTROL  1 MMOL/ML IV SOLN COMPARISON:  Brain MRI without and with contrast 04/17/2022 and earlier. FINDINGS: Brain: Cerebral volume remains normal for age. No restricted diffusion to suggest acute infarction. No midline shift, mass effect, evidence of mass lesion, ventriculomegaly, extra-axial collection or acute intracranial hemorrhage. Cervicomedullary junction and pituitary are within normal limits. No abnormal enhancement identified.  No dural thickening. Elnor and white matter signal appears stable since 2023, normal for age throughout the brain with a solitary small nonspecific focus of left frontal lobe white matter T2 and FLAIR hyperintensity. No cortical encephalomalacia. No chronic cerebral blood products.  Deep gray nuclei, brainstem, and cerebellum appear normal. Vascular: Major intracranial vascular flow voids are stable since 2023. Following contrast major dural venous sinuses are enhancing and appear patent.  Skull and upper cervical spine: Visible cervical spine and bone marrow signal stable since 2023 and within normal limits for age. Sinuses/Orbits: Stable and negative. Other: Visible internal auditory structures appear normal. Mastoids are clear. Negative visible scalp and face. IMPRESSION: Stable since 2023 and normal for age MRI appearance of the brain. No metastatic disease or acute intracranial abnormality. Electronically Signed   By: VEAR Hurst M.D.   On: 09/14/2023 11:11   US  AXILLARY NODE CORE BIOPSY RIGHT Addendum Date: 09/10/2023 ADDENDUM REPORT: 09/10/2023 12:39 ADDENDUM: PATHOLOGY revealed: 1. Lymph node, needle/core biopsy, right axilla, hydromark coil clip- ONE LYMPH NODE, POSITIVE FOR METASTATIC CARCINOMA MORPHOLOGICALLY COMPATIBLE WITH BREAST PRIMARY (1/1). METASTATIC FOCUS: 11 MM/1.1 CM. EXTRANODAL EXTENSION IDENTIFIED. Pathology results are CONCORDANT with imaging findings, per Dr. Norleen Croak. Pathology results and recommendations were discussed with patient via telephone on 09/09/23 by Rock Hover RN. Patient reported biopsy site doing well with no adverse symptoms, and only slight tenderness at the site. Post biopsy care instructions were reviewed, questions were answered and my direct phone number was provided. Patient was instructed to call [facility] for any additional questions or concerns related to biopsy site. Note: I Joya Hover RN) sent Epic message to provider (Dr. Zelphia Cap) on 09/10/2023 in regard to the following: patient missed MRI biopsy appointments, and I offered to reschedule biopsies for patient. Patient stated her understanding was to delay MRI biopsies until PET scan results were reviewed with provider. My phone number was given to patient and I encouraged her to  call me back for assistance with scheduling MRI biopsies if needed. Patient expressed understanding. RECOMMENDATION: Management per clinical team as patient has recent diagnosis of LEFT breast cancer with newly biopsy proven RIGHT axillary nodal metastases. MRI biopsies of the RIGHT breast if still clinically needed (see discussion above). Pathology results reported by Rock Hover RN on 09/09/2023. Electronically Signed   By: Norleen Croak M.D.   On: 09/10/2023 12:39   Result Date: 09/10/2023 CLINICAL DATA:  Enlarged RIGHT axillary lymphadenopathy in the setting of LEFT breast cancer EXAM: US  AXILLARY NODE CORE BIOPSY RIGHT COMPARISON:  Previous exam(s). PROCEDURE: I met with the patient and we discussed the procedure of ultrasound-guided biopsy, including benefits and alternatives. We discussed the high likelihood of a successful procedure. We discussed the risks of the procedure, including infection, bleeding, tissue injury, clip migration, and inadequate sampling. Informed written consent was given. The usual time-out protocol was performed immediately prior to the procedure. Using sterile technique and 1% lidocaine  and 1% lidocaine  with epinephrine  as local anesthetic, under direct ultrasound visualization, a 14 gauge spring-loaded device was used to perform biopsy of a representative enlarged RIGHT axillary lymph node using a anti radial approach. At the conclusion of the procedure [facility] coil tissue marker clip was deployed into the biopsy cavity. Follow up 2 view mammogram was performed and dictated separately. IMPRESSION: Ultrasound guided biopsy of a representative enlarged RIGHT axillary lymph node. No apparent complications. Electronically Signed: By: Norleen Croak M.D. On: 09/08/2023 10:57   MM CLIP PLACEMENT RIGHT Result Date: 09/08/2023 CLINICAL DATA:  Same day RIGHT axillary node biopsy EXAM: 3D DIAGNOSTIC RIGHT MAMMOGRAM POST ULTRASOUND BIOPSY COMPARISON:  Previous exam(s). ACR Breast Density  Category b: There are scattered areas of fibroglandular density. FINDINGS: 3D Mammographic images were obtained following ultrasound guided biopsy of an enlarged RIGHT axillary lymph node. The biopsy marking clip is in expected position at the site of biopsy. IMPRESSION: Appropriate positioning of the HydroMARK coil shaped  biopsy marking clip at the site of biopsy in the RIGHT axilla. Final Assessment: Post Procedure Mammograms for Marker Placement Electronically Signed   By: Norleen Croak M.D.   On: 09/08/2023 10:59   US  AXILLA RIGHT Result Date: 09/08/2023 CLINICAL DATA:  LEFT breast inflammatory cancer, RIGHT axillary lymphadenopathy on MRI EXAM: ULTRASOUND OF THE RIGHT AXILLA COMPARISON:  MRI dated September 01, 2023 FINDINGS: Ultrasound is performed, showing multiple enlarged lymph nodes with cortical thickness measuring up to 11 mm. IMPRESSION: RIGHT axillary lymphadenopathy is confirmed via targeted ultrasound. Biopsy was subsequently performed and is separately dictated. RECOMMENDATION: RIGHT axillary lymph node biopsy, which was subsequently performed and is separately dictated. I have discussed the findings and recommendations with the patient. If applicable, a reminder letter will be sent to the patient regarding the next appointment. BI-RADS CATEGORY  4: Suspicious. Electronically Signed   By: Norleen Croak M.D.   On: 09/08/2023 10:44   MR BREAST BILATERAL W WO CONTRAST INC CAD Result Date: 09/02/2023 CLINICAL DATA:  History of LEFT breast cancer status post lumpectomy, radiation and chemotherapy in 2024. Skin punch biopsy on 08/27/2023 demonstrated invasive mammary carcinoma invading the skin in the epidermis at the LEFT breast 11 o'clock biopsy location. EXAM: BILATERAL BREAST MRI WITH AND WITHOUT CONTRAST TECHNIQUE: Multiplanar, multisequence MR images of both breasts were obtained prior to and following the intravenous administration of 10 ml of Gadavist  Three-dimensional MR images were rendered by  post-processing of the original MR data on an independent workstation. The three-dimensional MR images were interpreted, and findings are reported in the following complete MRI report for this study. Three dimensional images were evaluated at the independent interpreting workstation using the DynaCAD thin client. COMPARISON:  Mammogram dated July 28, 2023 FINDINGS: Breast composition: b. Scattered fibroglandular tissue. Background parenchymal enhancement: Mild Right breast: In the RIGHT breast, anterior depth, LATERAL periareolar 9 o'clock position there is a 3 mm focus of enhancement (series 15, image 43 which is larger and more conspicuous the other, benign-appearing foci seen throughout the RIGHT breast. A second 5 mm oval mass with irregular margins is present at the central 9 o'clock position, middle depth, and demonstrates punctate plateau kinetics centrally. It is best appreciated on the third pass imaging. Left breast: Postsurgical changes status post LEFT breast lumpectomy, with diffuse skin thickening and edema. Postsurgical changes of the LEFT axilla are also noted. There is a complex seroma with internal debris at the lumpectomy site. Thin peripheral enhancement laterally is likely due to recent instrumentation, less likely recurrent malignancy. There is diffuse abnormal skin enhancement involving the majority of the LEFT breast in the nipple, most confluent in the direct RETROAREOLAR and LATERAL periareolar region on series 15, image 35, but present in all 4 quadrants (for example upper inner quadrant on image 63; lower inner quadrant on image 27; lower outer quadrant on image 24; and upper outer quadrant on image 58). The skin punch biopsy site is within an area of confluent enhancement on image 51. The nipple involvement may extend partially into the RETROAREOLAR region (series 15, image 35). Lymph nodes: Markedly enlarged RIGHT axillary level 1 lymph nodes (series 6, images 70 through 88). The  largest node measures 2.4 x 2.8 cm in the axial plane (image 84). Two small but asymmetric level 2 nodes are also present on the RIGHT (image 83), the larger measuring up to 5 mm in size. Confluent enhancement surrounding the LEFT internal mammary vessels measuring 1.3 cm (series 14, image 63), possibly reflecting a  focally dilated vein given tortuous appearance on axial images and linear configuration when viewed in the sagittal plane. Ancillary findings:  RIGHT chest wall Port-A-Cath in place. IMPRESSION: 1. Widespread abnormal enhancement involving the skin and nipple of the LEFT breast, involving all 4 quadrants and compatible with known, biopsy proven LEFT breast cancer invading the skin. There is likely slight RETROAREOLAR extension of the nipple involvement extending 8 mm posteriorly from the nipple. 2. Post treatment changes of the LEFT breast without other evidence of recurrent malignancy in the LEFT breast. Thin peripheral enhancement at the lumpectomy site is nonspecific but favored to be due to recent instrumentation. 3. Markedly enlarged RIGHT axillary level 1 lymph nodes, for which second-look ultrasound and ultrasound-guided biopsy is recommended. Small but asymmetric RIGHT axillary level 2 lymph nodes are also suspicious for malignancy but not amenable to percutaneous sampling. 4. Indeterminate RIGHT breast focus at the 9 o'clock periareolar position anterior depth measuring 3 mm and indeterminate RIGHT breast mass at the 9 o'clock central position at middle depth measuring 5 mm. If management would be impacted, MRI guided biopsies of the focus and mass at 9 o'clock in the RIGHT breast is recommended. 5. Confluent enhancement along the LEFT internal mammary vessels which could reflect ectatic vascular structures or an enlarged LEFT internal mammary lymph node. Recommend attention on forthcoming PET-CT. RECOMMENDATION: 1. RIGHT axillary second-look ultrasound and ultrasound-guided biopsy for  suspicious lymphadenopathy. 2. RIGHT breast MRI guided biopsy x2, if the results would impact clinical management. 3. Follow-up possible LEFT internal mammary lymph node on forthcoming PET-CT. 4. Otherwise, management of known LEFT breast malignancy involving the skin per surgical/oncology teams. BI-RADS CATEGORY  4: Suspicious. Electronically Signed   By: Norleen Croak M.D.   On: 09/02/2023 12:32

## 2023-09-27 NOTE — Assessment & Plan Note (Signed)
 Treatment plan as listed above.

## 2023-09-27 NOTE — Telephone Encounter (Signed)
 Testing completed and copy placed in HIM bin to scan. Copy also given to MD

## 2023-09-28 ENCOUNTER — Encounter: Payer: Self-pay | Admitting: Oncology

## 2023-09-28 ENCOUNTER — Encounter: Payer: Self-pay | Admitting: *Deleted

## 2023-09-28 LAB — CANCER ANTIGEN 15-3: CA 15-3: 18.3 U/mL (ref 0.0–25.0)

## 2023-09-30 ENCOUNTER — Other Ambulatory Visit

## 2023-10-04 ENCOUNTER — Emergency Department

## 2023-10-04 ENCOUNTER — Emergency Department
Admission: EM | Admit: 2023-10-04 | Discharge: 2023-10-04 | Disposition: A | Discharge status: Home / Self Care | Attending: Emergency Medicine | Admitting: Emergency Medicine

## 2023-10-04 ENCOUNTER — Other Ambulatory Visit: Payer: Self-pay

## 2023-10-04 DIAGNOSIS — K5641 Fecal impaction: Secondary | ICD-10-CM | POA: Diagnosis not present

## 2023-10-04 DIAGNOSIS — K5900 Constipation, unspecified: Secondary | ICD-10-CM | POA: Diagnosis present

## 2023-10-04 DIAGNOSIS — Z853 Personal history of malignant neoplasm of breast: Secondary | ICD-10-CM | POA: Insufficient documentation

## 2023-10-04 LAB — COMPREHENSIVE METABOLIC PANEL WITH GFR
ALT: 19 U/L (ref 0–44)
AST: 20 U/L (ref 15–41)
Albumin: 3.5 g/dL (ref 3.5–5.0)
Alkaline Phosphatase: 92 U/L (ref 38–126)
Anion gap: 10 (ref 5–15)
BUN: 18 mg/dL (ref 8–23)
CO2: 27 mmol/L (ref 22–32)
Calcium: 9.1 mg/dL (ref 8.9–10.3)
Chloride: 97 mmol/L — ABNORMAL LOW (ref 98–111)
Creatinine, Ser: 0.98 mg/dL (ref 0.44–1.00)
GFR, Estimated: >60 mL/min (ref >60)
Glucose, Bld: 119 mg/dL — ABNORMAL HIGH (ref 70–99)
Potassium: 3.9 mmol/L (ref 3.5–5.1)
Sodium: 134 mmol/L — ABNORMAL LOW (ref 135–145)
Total Bilirubin: 0.4 mg/dL (ref 0.0–1.2)
Total Protein: 7.3 g/dL (ref 6.5–8.1)

## 2023-10-04 LAB — CBC
HCT: 35.5 % — ABNORMAL LOW (ref 36.0–46.0)
Hemoglobin: 11.3 g/dL — ABNORMAL LOW (ref 12.0–15.0)
MCH: 25.6 pg — ABNORMAL LOW (ref 26.0–34.0)
MCHC: 31.8 g/dL (ref 30.0–36.0)
MCV: 80.5 fL (ref 80.0–100.0)
Platelets: 188 K/uL (ref 150–400)
RBC: 4.41 MIL/uL (ref 3.87–5.11)
RDW: 14.4 % (ref 11.5–15.5)
WBC: 5.5 K/uL (ref 4.0–10.5)
nRBC: 0 % (ref 0.0–0.2)

## 2023-10-04 LAB — LIPASE, BLOOD: Lipase: 55 U/L — ABNORMAL HIGH (ref 11–51)

## 2023-10-04 MED ORDER — SMOG ENEMA
960.0000 mL | RECTAL | Status: AC
  Administered 2023-10-04: 960 mL via RECTAL
  Filled 2023-10-04: qty 960

## 2023-10-04 NOTE — Discharge Instructions (Signed)
 ? ?  Please return to the emergency room right away if you are to develop a fever, severe nausea, your pain becomes severe or worsens, you are unable to keep food down, begin vomiting any dark or bloody fluid, you develop any dark or bloody stools, feel dehydrated, or other new concerns or symptoms arise. ? ?

## 2023-10-04 NOTE — ED Notes (Signed)
 This RN with the help of Donny RN provided minimal assistance to help pt from bed to bedside commode.

## 2023-10-04 NOTE — ED Triage Notes (Signed)
 Pt to ED via POV from home. Pt reports has stage 4 breast cancer. Pt reports constipation x3 days and is having abd pain. Pt also reports vaginal bleeding that she believes it is from straining to have BM.   Pt is on chemo and has port.

## 2023-10-04 NOTE — ED Provider Notes (Signed)
 [facility] Provider Note    Event Date/Time   First MD Initiated Contact with Patient 10/04/23 1735     (approximate)   History   Constipation   HPI  Vanessa Fisher is a 63 y.o. female with a history of active breast cancer on chemotherapy, anemia  Patient reports she has been feeling constipated for about 3 days.  Today she feels like she been having pressure around her rectum, she was able to partially self disimpact but feels like it is stuck up further.  Urgency and a little bit of blood in the rectal area after she did her own disimpaction.  No fevers or chills no nausea or vomiting.  She does relate that occasionally she will have vomiting but this usually been associated with her chemotherapy she has not had any in the last couple days.  Her stomach itself and abdomen does not feel painful.  She reports it feels more like she is stuck and constipated up in the rectum   She has had constipation before, and feels like she may be impacted.  She used to work at a care facility, and reports that she is familiar with the concept of disimpaction but is never had to have it done herself before  She reports still eating and drinking fluids, but just feeling very full especially in the rectal area  Physical Exam   Triage Vital Signs: ED Triage Vitals  Encounter Vitals Group     BP 10/04/23 1638 (!) 131/45     Girls Systolic BP Percentile --      Girls Diastolic BP Percentile --      Boys Systolic BP Percentile --      Boys Diastolic BP Percentile --      Pulse Rate 10/04/23 1638 84     Resp 10/04/23 1638 18     Temp 10/04/23 1638 98.3 F (36.8 C)     Temp Source 10/04/23 1638 Oral     SpO2 10/04/23 1638 98 %     Weight --      Height --      Head Circumference --      Peak Flow --      Pain Score 10/04/23 1639 9     Pain Loc --      Pain Education --      Exclude from Growth Chart --     Most recent vital signs: Vitals:   10/04/23  2200 10/04/23 2218  BP: (!) 106/51   Pulse: 64   Resp:    Temp:  98.2 F (36.8 C)  SpO2: 100%      General: Awake, no distress.  She is very pleasant CV:  Good peripheral perfusion.  Normal tone Resp:  Normal effort.  No distress clear even unlabored respirations.  Clear lung sounds bilateral Abd:  The abdomen does not appear frankly distended.  It is soft and she reports is not tender in any quadrant.  No tenderness to palpation in any quadrant.  No obvious masses. Performed rectal exam with nurse Manuelita.  The patient has small amount of fecal impaction that I am able to disimpact rectally, but I am able to feel with my finger what feels like a fairly solid stool ball or disimpaction more distal.  Patient reports area to be sore.  There is no frank bleeding.  The stool is brown.  Suspect patient has slightly more proximal impaction based on examination. Other:     ED  Results / Procedures / Treatments   Labs (all labs ordered are listed, but only abnormal results are displayed) Labs Reviewed  LIPASE, BLOOD - Abnormal; Notable for the following components:      Result Value   Lipase 55 (*)    All other components within normal limits  COMPREHENSIVE METABOLIC PANEL WITH GFR - Abnormal; Notable for the following components:   Sodium 134 (*)    Chloride 97 (*)    Glucose, Bld 119 (*)    All other components within normal limits  CBC - Abnormal; Notable for the following components:   Hemoglobin 11.3 (*)    HCT 35.5 (*)    MCH 25.6 (*)    All other components within normal limits  URINALYSIS, ROUTINE W REFLEX MICROSCOPIC      RADIOLOGY  Abdominal plain film interpreted me as negative for acute obstructive pathology   DG Abdomen 1 View Result Date: 10/04/2023 CLINICAL DATA:  Constipation. EXAM: ABDOMEN - 1 VIEW COMPARISON:  05/20/2021, 04/17/2021. FINDINGS: The bowel gas pattern is normal. A mild amount of retained stool is present in the colon. No radio-opaque calculi or  other acute radiographic abnormality are seen. Degenerative changes are noted in the lumbar spine IMPRESSION: 1. No evidence of bowel obstruction. 2. Mild amount of retained stool in the colon. Electronically Signed   By: Leita Birmingham M.D.   On: 10/04/2023 17:26      PROCEDURES:  Critical Care performed: No  Procedures   MEDICATIONS ORDERED IN ED: Medications  sorbitol , magnesium  hydroxide, mineral oil, glycerin (SMOG) enema (960 mLs Rectal Given 10/04/23 1904)     IMPRESSION / MDM / ASSESSMENT AND PLAN / ED COURSE  I reviewed the triage vital signs and the nursing notes.                              Differential diagnosis includes but is not limited to, abdominal perforation, aortic dissection, cholecystitis, appendicitis, diverticulitis, colitis, esophagitis/gastritis, kidney stone, pyelonephritis, urinary tract infection, etc. All are considered in decision and treatment plan. Based upon the patient's presentation and risk factors, as well as the patient's description of symptoms and clinical exam findings of no associated abdominal pain to palpation of the anterior abdomen but notable stool in the rectum with evidence of slightly more proximal impaction it appears the patient is likely suffering from constipation and rectal impaction.  She has no fever no active vomiting.  Still eating.  Able to provide some disimpaction, but still more remains more proximal.  Will attempt enema, and reassess for improvement thereafter.     Patient's presentation is most consistent with acute complicated illness / injury requiring diagnostic workup.   Patient relates that she has recently talked with her oncologist about constipation issues and has been taking MiraLAX some but not routinely.  She reports symptoms and clinical examination seem most consistent with fecal impaction without evidence of acute obstruction or intra-abdominal pain.  Very minimally elevated lipase is fairly nonspecific in  this instance and I suspect without significant abdominal pain or epigastric discomfort etc. this would not represent acute pancreatitis.   Clinical Course as of 10/04/23 2237  Mon Oct 04, 2023  2235 Patient reports show symptoms much improved.  Had a large bowel movement after enema and feels much better.  Abdomen soft nontender nondistended.  Patient reports feeling well would like to be discharged.  Appears appropriate for discharge. [MQ]    Clinical Course  User Index [MQ] Dicky Anes, MD   Vitals:   10/04/23 2200 10/04/23 2218  BP: (!) 106/51   Pulse: 64   Resp:    Temp:  98.2 F (36.8 C)  SpO2: 100%    Return precautions and treatment recommendations and follow-up discussed with the patient who is agreeable with the plan.   FINAL CLINICAL IMPRESSION(S) / ED DIAGNOSES   Final diagnoses:  Fecal impaction (HCC)     Rx / DC Orders   ED Discharge Orders     None        Note:  This document was prepared using Dragon voice recognition software and may include unintentional dictation errors.   Dicky Anes, MD 10/04/23 2237

## 2023-10-04 NOTE — ED Notes (Signed)
 This RN with the help of Donny RN provided minimal assistance to help pt from bedside commode to bed. Pt tolerated activity well. Bed is in lowest, locked position and call bell is within reach.

## 2023-10-11 ENCOUNTER — Emergency Department

## 2023-10-11 ENCOUNTER — Telehealth: Payer: Self-pay

## 2023-10-11 ENCOUNTER — Other Ambulatory Visit: Payer: Self-pay

## 2023-10-11 ENCOUNTER — Emergency Department: Admission: EM | Admit: 2023-10-11 | Discharge: 2023-10-11 | Disposition: A | Discharge status: Home / Self Care

## 2023-10-11 DIAGNOSIS — R197 Diarrhea, unspecified: Secondary | ICD-10-CM | POA: Diagnosis not present

## 2023-10-11 DIAGNOSIS — R109 Unspecified abdominal pain: Secondary | ICD-10-CM | POA: Insufficient documentation

## 2023-10-11 LAB — COMPREHENSIVE METABOLIC PANEL WITH GFR
ALT: 20 U/L (ref 0–44)
AST: 20 U/L (ref 15–41)
Albumin: 2.9 g/dL — ABNORMAL LOW (ref 3.5–5.0)
Alkaline Phosphatase: 80 U/L (ref 38–126)
Anion gap: 9 (ref 5–15)
BUN: 10 mg/dL (ref 8–23)
CO2: 26 mmol/L (ref 22–32)
Calcium: 8.6 mg/dL — ABNORMAL LOW (ref 8.9–10.3)
Chloride: 104 mmol/L (ref 98–111)
Creatinine, Ser: 0.92 mg/dL (ref 0.44–1.00)
GFR, Estimated: >60 mL/min (ref >60)
Glucose, Bld: 107 mg/dL — ABNORMAL HIGH (ref 70–99)
Potassium: 3.5 mmol/L (ref 3.5–5.1)
Sodium: 139 mmol/L (ref 135–145)
Total Bilirubin: 0.5 mg/dL (ref 0.0–1.2)
Total Protein: 6.4 g/dL — ABNORMAL LOW (ref 6.5–8.1)

## 2023-10-11 LAB — CBC
HCT: 32.7 % — ABNORMAL LOW (ref 36.0–46.0)
Hemoglobin: 10.3 g/dL — ABNORMAL LOW (ref 12.0–15.0)
MCH: 25.7 pg — ABNORMAL LOW (ref 26.0–34.0)
MCHC: 31.5 g/dL (ref 30.0–36.0)
MCV: 81.5 fL (ref 80.0–100.0)
Platelets: 310 K/uL (ref 150–400)
RBC: 4.01 MIL/uL (ref 3.87–5.11)
RDW: 14.8 % (ref 11.5–15.5)
WBC: 3.3 K/uL — ABNORMAL LOW (ref 4.0–10.5)
nRBC: 0 % (ref 0.0–0.2)

## 2023-10-11 LAB — CBC WITH DIFFERENTIAL/PLATELET
Abs Immature Granulocytes: 0.01 K/uL (ref 0.00–0.07)
Basophils Absolute: 0 K/uL (ref 0.0–0.1)
Basophils Relative: 1 %
Eosinophils Absolute: 0.1 K/uL (ref 0.0–0.5)
Eosinophils Relative: 3 %
HCT: 32.8 % — ABNORMAL LOW (ref 36.0–46.0)
Hemoglobin: 10.4 g/dL — ABNORMAL LOW (ref 12.0–15.0)
Immature Granulocytes: 0 %
Lymphocytes Relative: 39 %
Lymphs Abs: 1.3 K/uL (ref 0.7–4.0)
MCH: 25.7 pg — ABNORMAL LOW (ref 26.0–34.0)
MCHC: 31.7 g/dL (ref 30.0–36.0)
MCV: 81.2 fL (ref 80.0–100.0)
Monocytes Absolute: 0.3 K/uL (ref 0.1–1.0)
Monocytes Relative: 10 %
Neutro Abs: 1.5 K/uL — ABNORMAL LOW (ref 1.7–7.7)
Neutrophils Relative %: 47 %
Platelets: 315 K/uL (ref 150–400)
RBC: 4.04 MIL/uL (ref 3.87–5.11)
RDW: 14.7 % (ref 11.5–15.5)
WBC: 3.3 K/uL — ABNORMAL LOW (ref 4.0–10.5)
nRBC: 0 % (ref 0.0–0.2)

## 2023-10-11 LAB — URINALYSIS, ROUTINE W REFLEX MICROSCOPIC
Bilirubin Urine: NEGATIVE
Glucose, UA: NEGATIVE mg/dL
Hgb urine dipstick: NEGATIVE
Ketones, ur: 5 mg/dL — AB
Nitrite: NEGATIVE
Protein, ur: NEGATIVE mg/dL
Specific Gravity, Urine: >1.046 — ABNORMAL HIGH (ref 1.005–1.030)
pH: 5 (ref 5.0–8.0)

## 2023-10-11 LAB — LACTIC ACID, PLASMA
Lactic Acid, Venous: 0.9 mmol/L (ref 0.5–1.9)
Lactic Acid, Venous: 0.9 mmol/L (ref 0.5–1.9)

## 2023-10-11 LAB — LIPASE, BLOOD: Lipase: 39 U/L (ref 11–51)

## 2023-10-11 MED ORDER — MORPHINE SULFATE (PF) 4 MG/ML IV SOLN
4.0000 mg | Freq: Once | INTRAVENOUS | Status: AC
  Administered 2023-10-11: 4 mg via INTRAVENOUS
  Filled 2023-10-11: qty 1

## 2023-10-11 MED ORDER — ONDANSETRON HCL 4 MG/2ML IJ SOLN
4.0000 mg | Freq: Once | INTRAMUSCULAR | Status: AC
  Administered 2023-10-11: 4 mg via INTRAVENOUS
  Filled 2023-10-11: qty 2

## 2023-10-11 MED ORDER — SODIUM CHLORIDE 0.9 % IV BOLUS
1000.0000 mL | Freq: Once | INTRAVENOUS | Status: AC
  Administered 2023-10-11: 1000 mL via INTRAVENOUS

## 2023-10-11 MED ORDER — IOHEXOL 300 MG/ML  SOLN
100.0000 mL | Freq: Once | INTRAMUSCULAR | Status: AC | PRN
  Administered 2023-10-11: 100 mL via INTRAVENOUS

## 2023-10-11 NOTE — ED Notes (Signed)
 Pt gone to CT

## 2023-10-11 NOTE — Telephone Encounter (Signed)
 Call back made to patient returing call from answering service.   Pt reports that last week she went to ER due to constipation. They gave her an enema and it resolved. She has diarrhea for a few days after, but this morning around 3am she reports having severe stomach cramping and she threw up poop. Advised pt to go to ER per Dr. Babara. Pt verbalized understanding and she will go to ER.

## 2023-10-11 NOTE — ED Triage Notes (Signed)
 Pt states she is now having diarrhea and vomiting stool. Pt states the vomiting started last night. . Pt states she was here recently for constipation and belly pain. Pt is cancer pt and has port in place.

## 2023-10-11 NOTE — Discharge Instructions (Signed)

## 2023-10-11 NOTE — ED Provider Notes (Signed)
 [facility] Provider Note    Event Date/Time   First MD Initiated Contact with Patient 10/11/23 0932     (approximate)   History   Abdominal Pain   HPI  Vanessa Fisher is a 63 y.o. female  story of active breast cancer on chemotherapy, anemia Was here on 10/04/2023 for constipation, had an Xray and an enema and reports worsening abdominal pain and frequent diarrhea for the past week.  Denies any chest pain or shortness of breath.  Denies any blood in her bowel movements.  Reports the pain is predominantly left sided.  Has not had any syncopal episodes and is compliant with all of her medications      Physical Exam   Triage Vital Signs: ED Triage Vitals  Encounter Vitals Group     BP 10/11/23 0927 (!) 115/48     Girls Systolic BP Percentile --      Girls Diastolic BP Percentile --      Boys Systolic BP Percentile --      Boys Diastolic BP Percentile --      Pulse Rate 10/11/23 0927 61     Resp 10/11/23 0927 17     Temp 10/11/23 0927 98 F (36.7 C)     Temp src --      SpO2 10/11/23 0927 100 %     Weight 10/11/23 0929 225 lb (102.1 kg)     Height 10/11/23 0929 5' 6 (1.676 m)     Head Circumference --      Peak Flow --      Pain Score 10/11/23 0929 7     Pain Loc --      Pain Education --      Exclude from Growth Chart --     Most recent vital signs: Vitals:   10/11/23 1444 10/11/23 1455  BP: (!) 91/51 (!) 101/45  Pulse: 61 (!) 56  Resp: 18 17  Temp: 98.1 F (36.7 C)   SpO2:  100%    Nursing Triage Note reviewed. Vital signs reviewed and patients oxygen saturation is normoxic  General: Patient is well nourished, well developed, awake and alert, resting comfortably in no acute distress Head: Normocephalic and atraumatic Eyes: Normal inspection, extraocular muscles intact, no conjunctival pallor Ear, nose, throat: Normal external exam Neck: Normal range of motion Respiratory: Patient is in no respiratory distress, lungs  CTAB Cardiovascular: Patient is not tachycardic, RRR without murmur appreciated GI: Abd soft, tender to palpation in the left abdomen with no rebound or guarding Back: Normal inspection of the back with good strength and range of motion throughout all ext Extremities: pulses intact with good cap refills, no LE pitting edema or calf tenderness Neuro: The patient is alert and oriented to person, place, and time, appropriately conversive, with 5/5 bilat UE/LE strength, no gross motor or sensory defects noted. Coordination appears to be adequate. Skin: Warm, dry, and intact Psych: normal mood and affect, no SI or HI  ED Results / Procedures / Treatments   Labs (all labs ordered are listed, but only abnormal results are displayed) Labs Reviewed  COMPREHENSIVE METABOLIC PANEL WITH GFR - Abnormal; Notable for the following components:      Result Value   Glucose, Bld 107 (*)    Calcium  8.6 (*)    Total Protein 6.4 (*)    Albumin 2.9 (*)    All other components within normal limits  CBC - Abnormal; Notable for the following components:   WBC  3.3 (*)    Hemoglobin 10.3 (*)    HCT 32.7 (*)    MCH 25.7 (*)    All other components within normal limits  URINALYSIS, ROUTINE W REFLEX MICROSCOPIC - Abnormal; Notable for the following components:   Color, Urine YELLOW (*)    APPearance CLEAR (*)    Specific Gravity, Urine >1.046 (*)    Ketones, ur 5 (*)    Leukocytes,Ua MODERATE (*)    Bacteria, UA RARE (*)    All other components within normal limits  CBC WITH DIFFERENTIAL/PLATELET - Abnormal; Notable for the following components:   WBC 3.3 (*)    Hemoglobin 10.4 (*)    HCT 32.8 (*)    MCH 25.7 (*)    Neutro Abs 1.5 (*)    All other components within normal limits  C DIFFICILE QUICK SCREEN W PCR REFLEX    LIPASE, BLOOD  LACTIC ACID, PLASMA  LACTIC ACID, PLASMA     EKG EKG and rhythm strip are interpreted by myself:   EKG: [Normal sinus rhythm] at heart rate of 61, normal QRS  duration, QTc 439, nnonspecific ST segments and T waves no ectopy EKG not consistent with Acute STEMI Rhythm strip: NSR in lead II   RADIOLOGY CT abdomen pelvis with IV contrast: No acute abnormalities on my independent review interpretation and radiologist agrees    PROCEDURES:  Critical Care performed: No  Procedures   MEDICATIONS ORDERED IN ED: Medications  sodium chloride  0.9 % bolus 1,000 mL (0 mLs Intravenous Stopped 10/11/23 1246)  morphine  (PF) 4 MG/ML injection 4 mg (4 mg Intravenous Given 10/11/23 1039)  ondansetron  (ZOFRAN ) injection 4 mg (4 mg Intravenous Given 10/11/23 1038)  iohexol  (OMNIPAQUE ) 300 MG/ML solution 100 mL (100 mLs Intravenous Contrast Given 10/11/23 1208)     IMPRESSION / MDM / ASSESSMENT AND PLAN / ED COURSE                                Differential diagnosis includes, but is not limited to, small bowel obstruction, diverticulitis, intra-abdominal abscess, enema reaction, acute renal insufficiency   ED course: Patient arrives and she is immunocompromise on chemotherapy with worsening diarrhea and abdominal pain.  Likely she was not found to be neutropenic.  She had no profound electrolyte derangements or acute renal insufficiency.  Urinalysis does not seem consistent with UTI.  She did not have an elevated lactic acidosis.  CT abdomen pelvis with IV contrast pursued this time given her risk factors and continued symptoms which demonstrated no acute abnormalities.  Patient was unable to give us  a sample for C. difficile screen--which is reassuring especially since she has no profound leukocytosis.  She is able to ambulate and take p.o. and was reassured by her workup today.  She will follow-up with her oncology team.  All questions answered and patient requested discharge   Clinical Course as of 10/11/23 1638  Mon Oct 11, 2023  1055 NEUT#(!): 1.5 Not technically neutropenic [HD]  1234 Lactic Acid, Venous: 0.9 Not elevated [HD]  1322 CT ABDOMEN PELVIS  W CONTRAST No acute abnormality [HD]  1427 Urinalysis, Routine w reflex microscopic -Urine, Random(!) Unremarkable [HD]  1432 Repeat abdominal exam completely benign.  Patient feels comfortable returning home at this time [HD]    Clinical Course User Index [HD] Nicholaus Rolland BRAVO, MD   At time of discharge there is no evidence of acute life, limb, vision, or fertility threat.  Patient has stable vital signs, pain is well controlled, patient is ambulatory and p.o. tolerant.  Discharge instructions were completed using the [employer] system. I would refer you to those at this time. All warnings prescriptions follow-up etc. were discussed in detail with the patient. Patient indicates understanding and is agreeable with this plan. All questions answered.  Patient is made aware that they may return to the emergency department for any worsening or new condition or for any other emergency.   FINAL CLINICAL IMPRESSION(S) / ED DIAGNOSES   Final diagnoses:  Diarrhea, unspecified type  Abdominal pain, unspecified abdominal location     Rx / DC Orders   ED Discharge Orders     None        Note:  This document was prepared using Dragon voice recognition software and may include unintentional dictation errors.   Nicholaus Rolland BRAVO, MD 10/11/23 [phone]

## 2023-10-11 NOTE — ED Notes (Signed)
 MD aware of pt's BP and verbalized continuation of discharged. Pt asymptomatic. Stable and ambulatory at time of departure. Pt verbalizes understanding of discharge instructions. Opportunity for questioning and answers were provided. Pt discharged from ED to home with family.

## 2023-10-15 MED FILL — Fosaprepitant Dimeglumine For IV Infusion 150 MG (Base Eq): INTRAVENOUS | Qty: 5 | Status: AC

## 2023-10-18 ENCOUNTER — Inpatient Hospital Stay

## 2023-10-18 ENCOUNTER — Inpatient Hospital Stay (HOSPITAL_BASED_OUTPATIENT_CLINIC_OR_DEPARTMENT_OTHER): Admitting: Hospice and Palliative Medicine

## 2023-10-18 ENCOUNTER — Encounter: Payer: Self-pay | Admitting: Oncology

## 2023-10-18 ENCOUNTER — Inpatient Hospital Stay: Attending: Oncology

## 2023-10-18 ENCOUNTER — Inpatient Hospital Stay (HOSPITAL_BASED_OUTPATIENT_CLINIC_OR_DEPARTMENT_OTHER): Admitting: Oncology

## 2023-10-18 VITALS — BP 112/65 | HR 59 | Temp 98.6°F | Resp 19 | Wt 235.2 lb

## 2023-10-18 VITALS — BP 129/71 | HR 58 | Temp 96.0°F | Resp 18

## 2023-10-18 DIAGNOSIS — Z452 Encounter for adjustment and management of vascular access device: Secondary | ICD-10-CM | POA: Insufficient documentation

## 2023-10-18 DIAGNOSIS — Z5111 Encounter for antineoplastic chemotherapy: Secondary | ICD-10-CM | POA: Diagnosis not present

## 2023-10-18 DIAGNOSIS — C50812 Malignant neoplasm of overlapping sites of left female breast: Secondary | ICD-10-CM

## 2023-10-18 DIAGNOSIS — K5900 Constipation, unspecified: Secondary | ICD-10-CM | POA: Insufficient documentation

## 2023-10-18 DIAGNOSIS — Z5112 Encounter for antineoplastic immunotherapy: Secondary | ICD-10-CM | POA: Diagnosis present

## 2023-10-18 DIAGNOSIS — C773 Secondary and unspecified malignant neoplasm of axilla and upper limb lymph nodes: Secondary | ICD-10-CM | POA: Diagnosis not present

## 2023-10-18 DIAGNOSIS — Z171 Estrogen receptor negative status [ER-]: Secondary | ICD-10-CM | POA: Diagnosis not present

## 2023-10-18 DIAGNOSIS — D649 Anemia, unspecified: Secondary | ICD-10-CM

## 2023-10-18 DIAGNOSIS — Z170 Estrogen receptor positive status [ER+]: Secondary | ICD-10-CM | POA: Diagnosis not present

## 2023-10-18 DIAGNOSIS — C50212 Malignant neoplasm of upper-inner quadrant of left female breast: Secondary | ICD-10-CM | POA: Diagnosis present

## 2023-10-18 LAB — CMP (CANCER CENTER ONLY)
ALT: 22 U/L (ref 0–44)
AST: 22 U/L (ref 15–41)
Albumin: 3.3 g/dL — ABNORMAL LOW (ref 3.5–5.0)
Alkaline Phosphatase: 100 U/L (ref 38–126)
Anion gap: 7 (ref 5–15)
BUN: 13 mg/dL (ref 8–23)
CO2: 25 mmol/L (ref 22–32)
Calcium: 8.8 mg/dL — ABNORMAL LOW (ref 8.9–10.3)
Chloride: 102 mmol/L (ref 98–111)
Creatinine: 0.69 mg/dL (ref 0.44–1.00)
GFR, Estimated: >60 mL/min (ref >60)
Glucose, Bld: 144 mg/dL — ABNORMAL HIGH (ref 70–99)
Potassium: 3.9 mmol/L (ref 3.5–5.1)
Sodium: 134 mmol/L — ABNORMAL LOW (ref 135–145)
Total Bilirubin: 0.3 mg/dL (ref 0.0–1.2)
Total Protein: 6.7 g/dL (ref 6.5–8.1)

## 2023-10-18 LAB — CBC WITH DIFFERENTIAL (CANCER CENTER ONLY)
Abs Immature Granulocytes: 0.25 K/uL — ABNORMAL HIGH (ref 0.00–0.07)
Basophils Absolute: 0 K/uL (ref 0.0–0.1)
Basophils Relative: 1 %
Eosinophils Absolute: 0 K/uL (ref 0.0–0.5)
Eosinophils Relative: 0 %
HCT: 32.9 % — ABNORMAL LOW (ref 36.0–46.0)
Hemoglobin: 10.3 g/dL — ABNORMAL LOW (ref 12.0–15.0)
Immature Granulocytes: 5 %
Lymphocytes Relative: 18 %
Lymphs Abs: 1 K/uL (ref 0.7–4.0)
MCH: 25.8 pg — ABNORMAL LOW (ref 26.0–34.0)
MCHC: 31.3 g/dL (ref 30.0–36.0)
MCV: 82.3 fL (ref 80.0–100.0)
Monocytes Absolute: 0.1 K/uL (ref 0.1–1.0)
Monocytes Relative: 2 %
Neutro Abs: 4.1 K/uL (ref 1.7–7.7)
Neutrophils Relative %: 74 %
Platelet Count: 440 K/uL — ABNORMAL HIGH (ref 150–400)
RBC: 4 MIL/uL (ref 3.87–5.11)
RDW: 15.9 % — ABNORMAL HIGH (ref 11.5–15.5)
WBC Count: 5.5 K/uL (ref 4.0–10.5)
nRBC: 0 % (ref 0.0–0.2)

## 2023-10-18 LAB — CANCER ANTIGEN 27.29: CA 27.29: 25.5 U/mL (ref 0.0–38.6)

## 2023-10-18 MED ORDER — SODIUM CHLORIDE 0.9 % IV SOLN
150.0000 mg | Freq: Once | INTRAVENOUS | Status: AC
  Administered 2023-10-18: 150 mg via INTRAVENOUS
  Filled 2023-10-18: qty 150

## 2023-10-18 MED ORDER — PALONOSETRON HCL INJECTION 0.25 MG/5ML
0.2500 mg | Freq: Once | INTRAVENOUS | Status: AC
  Administered 2023-10-18: 0.25 mg via INTRAVENOUS
  Filled 2023-10-18: qty 5

## 2023-10-18 MED ORDER — DEXTROSE 5 % IV SOLN
INTRAVENOUS | Status: DC
  Filled 2023-10-18: qty 250

## 2023-10-18 MED ORDER — FAM-TRASTUZUMAB DERUXTECAN-NXKI CHEMO 100 MG IV SOLR
5.4000 mg/kg | Freq: Once | INTRAVENOUS | Status: AC
  Administered 2023-10-18: 600 mg via INTRAVENOUS
  Filled 2023-10-18: qty 30

## 2023-10-18 MED ORDER — DIPHENHYDRAMINE HCL 25 MG PO CAPS
50.0000 mg | ORAL_CAPSULE | Freq: Once | ORAL | Status: AC
  Administered 2023-10-18: 50 mg via ORAL
  Filled 2023-10-18: qty 2

## 2023-10-18 MED ORDER — DEXAMETHASONE SODIUM PHOSPHATE 10 MG/ML IJ SOLN
10.0000 mg | Freq: Once | INTRAMUSCULAR | Status: AC
  Administered 2023-10-18: 10 mg via INTRAVENOUS
  Filled 2023-10-18: qty 1

## 2023-10-18 MED ORDER — ACETAMINOPHEN 325 MG PO TABS
650.0000 mg | ORAL_TABLET | Freq: Once | ORAL | Status: AC
  Administered 2023-10-18: 650 mg via ORAL
  Filled 2023-10-18: qty 2

## 2023-10-18 NOTE — Assessment & Plan Note (Signed)
 Recommend patient to take calcium 1200mg  daily, otc supply.  Take vitamin D supplementation.

## 2023-10-18 NOTE — Assessment & Plan Note (Signed)
 Hemoglobin is stable. Monitor counts. She may stop iron  supplementation due to constipation.

## 2023-10-18 NOTE — Progress Notes (Signed)
 Hematology/Oncology Progress note [phone] [phone]     CHIEF COMPLAINTS/REASON FOR VISIT:  Follow-up for Stage IV breast cancer, ER-,PR- HER2+ and ER-, PR-, HER2-  ASSESSMENT & PLAN:   Cancer Staging  Breast cancer (HCC) Staging form: Breast, AJCC 8th Edition - Clinical stage from 08/21/2021: Stage IIIB (cT4b, cN3c, cM0, G3, ER-, PR-, HER2+) - Signed by Vanessa Call, MD on 09/12/2021  Breast cancer (HCC) Left breast cancer, cT4b N3 Mx, ER/PR-, HER2 + S/p neoadjuvant chemotherapy 6 cycles of TCHP  S/p left lumpectomy  + SLNB -ypT0 ypN0, complete pathological response.  S/p adjuvant Transtuzumab and Pertuzumab  11 cycles. S/p adjuvant radiation-  Declined adjuvant Zometa. Recurrent Stage IV breast cancer. 08/27/2023 left breast skin punch biopsy showed ER+ PR + HER2 (3+) breast carcinoma 09/08/2023, right axillary lymph node biopsy showed .  ER- PR-  HER2 (3+)  breast carcinoma  PET scan showed recurrence in bilateral breast and lymphadenopathy. MRI brain negative.   Recommend Fam-trastuzumab  deruxtecan- Enhertu ,  Labs are reviewed and discussed with patient.  She tolerates treatment with some moderate difficulties. Proceed with cycle 2 treatments.   LVEF has decreased comparing to prior.  Refer to cardiology for further evaluation.   Encounter for antineoplastic chemotherapy Treatment plan as listed above  Hypocalcemia Recommend patient to take calcium  1200mg  daily, otc supply.  Take vitamin D  supplementation.   Hypomagnesemia Continue Slow Mag 1 tab BID   Normocytic anemia Hemoglobin is stable. Monitor counts. She may stop iron  supplementation due to constipation.      Orders Placed This Encounter  Procedures   Cancer antigen 27.29    Standing Status:   Future    Expected Date:   11/08/2023    Expiration Date:   11/07/2024   Cancer antigen 15-3    Standing Status:   Future    Expected Date:   11/08/2023    Expiration Date:   11/07/2024   CBC  with Differential (Cancer Center Only)    Standing Status:   Future    Expected Date:   11/08/2023    Expiration Date:   11/07/2024   CMP (Cancer Center only)    Standing Status:   Future    Expected Date:   11/08/2023    Expiration Date:   11/07/2024   Cancer antigen 27.29    Standing Status:   Future    Expected Date:   11/29/2023    Expiration Date:   11/28/2024   Cancer antigen 15-3    Standing Status:   Future    Expected Date:   11/29/2023    Expiration Date:   11/28/2024   CBC with Differential (Cancer Center Only)    Standing Status:   Future    Expected Date:   11/29/2023    Expiration Date:   11/28/2024   CMP (Cancer Center only)    Standing Status:   Future    Expected Date:   11/29/2023    Expiration Date:   11/28/2024   Follow up 3 weeks  We spent sufficient time to discuss many aspect of care, questions were answered to patient's satisfaction.  Fisher Babara, MD, PhD [facility] Hematology Oncology 10/18/2023      HISTORY OF PRESENTING ILLNESS:  Vanessa Fisher is a  63 y.o.  female presents for follow up of Stage IV metastatic breast cancer.  SUMMARY OF ONCOLOGIC HISTORY: Oncology History  Breast cancer (HCC)  07/31/2021 Imaging   Bilateral diagnostic mammogram and US  showed 5 centimeter LEFT breast  mass associated with pleomorphic calcifications is suspicious for invasive ductal carcinoma.At least 4 LEFT axillary lymph nodes with abnormal morphology   08/21/2021 Cancer Staging   Staging form: Breast, AJCC 8th Edition - Clinical: Stage IIIB (cT4b, cN3c, cM0, G3, ER-, PR-, HER2+) - Signed by Vanessa Call, MD on 08/28/2021 Histologic grading system: 3 grade system  -08/21/21 left breast ultrasound-guided biopsy showed invasive mammary carcinoma, grade 3, ER/PR negative, HER2 positive.  Left axillary lymph node biopsy positive for macro metastatic mammary carcinoma, 8 mm in greatest extent.   08/30/2021 Imaging   MRI brain w wo contrast -No metastatic disease or  acute intracranial abnormality. Essentially normal for age MRI appearance of the brain.    09/03/2021 Echocardiogram   1. Left ventricular ejection fraction, by estimation, is 55 to 60%. Left ventricular ejection fraction by 3D volume is 58 %. The left ventricle has normal function. The left ventricle has no regional wall motion abnormalities. There is mild left  ventricular hypertrophy. Left ventricular diastolic parameters were normal.  2. Right ventricular systolic function is normal. The right ventricular size is normal.  3. The mitral valve is normal in structure. No evidence of mitral valve regurgitation.  4. The aortic valve was not well visualized. Aortic valve regurgitation is not visualized   09/10/2021 Imaging   PET scan showed Large hypermetabolic left breast mass and diffuse skin thickening,consistent with primary breast carcinoma. Hypermetabolic lymphadenopathy in the left axilla, left subpectoral region, and left supraclavicular region, consistent with metastatic disease. No evidence of metastatic disease within the abdomen or pelvis   09/12/2021 - 02/13/2022 Chemotherapy   Patient is on Treatment Plan : BREAST  Docetaxel  + Carboplatin  + Trastuzumab  + Pertuzumab   (TCHP) q21d       Genetic Testing   Negative genetic testing. No pathogenic variants identified on the [facility] Common Hereditary Cancers+RNA panel. The report date is 10/26/2021.  The Common Hereditary Cancers Panel + RNA offered by [facility] includes sequencing and/or deletion duplication testing of the following 47 genes: APC, ATM, AXIN2, BARD1, BMPR1A, BRCA1, BRCA2, BRIP1, CDH1, CDKN2A (p14ARF), CDKN2A (p16INK4a), CKD4, CHEK2, CTNNA1, DICER1, EPCAM (Deletion/duplication testing only), GREM1 (promoter region deletion/duplication testing only), KIT, MEN1, MLH1, MSH2, MSH3, MSH6, MUTYH, NBN, NF1, NHTL1, PALB2, PDGFRA, PMS2, POLD1, POLE, PTEN, RAD50, RAD51C, RAD51D, SDHB, SDHC, SDHD, SMAD4, SMARCA4. STK11, TP53, TSC1, TSC2, and  VHL.  The following genes were evaluated for sequence changes only: SDHA and HOXB13 c.251G>A variant only.   02/09/2022 Echocardiogram   LVEF 55 to 60%    03/27/2022 Surgery   S/p left mastectomy + SLNB ypT0 ypN0,    04/24/2022 - 12/23/2022 Chemotherapy   Patient is on Treatment Plan : BREAST Trastuzumab   + Pertuzumab  q21d x 13 cycles     05/06/2022 Echocardiogram   LVEF 55 to 60%    07/02/2022 - 08/03/2022 Radiation Therapy   Adjuvant breast radiation.    10/20/2022 Imaging   Bone scan whole body showed 1. Degenerative uptake at the sternoclavicular joints, shoulders,knees and hindfoot bilaterally. 2. No evidence for metastatic disease to the. 3. Uptake in the left breast may be related to postsurgical change or residual tumor.   07/28/2023 Mammogram   Unilateral left breast mammogram showed  1. Diffuse MEDIAL LEFT breast erythema and pain without interval change in mammographic appearance or suspicious sonographic abnormality. Stable skin thickening in this area is presumably due to prior radiation therapy. Findings are most suspicious for cellulitis. Recommend treatment with antibiotics. If antibiotics fail to resolve the erythema, surgical or  dermatologic consultation for skin punch biopsy is recommended to exclude inflammatory breast cancer.   2. Redemonstrated LEFT breast seroma at the lumpectomy site, now with complex internal debris. Patient requests aspiration of the fluid component of this collection, which will be attempted under ultrasound guidance.   08/27/2023 Relapse/Recurrence   08/27/2023, left breast 11:00 punch biopsy showed 1. Breast, biopsy, left 11:00 punch BX :       - INVASIVE GRADE 3 MAMMARY CARCINOMA  INVADING SKIN AND EPIDERMIS WITH       ULCERATION  ER 70% positive, PR 0%, HER2 positive(3+).    09/08/2023 right axilla lymph node biopsy showed 1. Lymph node, needle/core biopsy, right axilla, hydromark coil clip :       ONE LYMPH NODE, POSITIVE FOR  METASTATIC CARCINOMA MORPHOLOGICALLY COMPATIBLE       WITH BREAST PRIMARY (1/1).       METASTATIC FOCUS: 11 MM / 1.1 CM.       EXTRANODAL EXTENSION IDENTIFIE    09/01/2023 Imaging   MRI breast bilaterally without contrast showed  1. Widespread abnormal enhancement involving the skin and nipple of the LEFT breast, involving all 4 quadrants and compatible with known, biopsy proven LEFT breast cancer invading the skin. There is likely slight RETROAREOLAR extension of the nipple involvement extending 8 mm posteriorly from the nipple.   2. Post treatment changes of the LEFT breast without other evidence of recurrent malignancy in the LEFT breast. Thin peripheral enhancement at the lumpectomy site is nonspecific but favored to be due to recent instrumentation.   3. Markedly enlarged RIGHT axillary level 1 lymph nodes, for which second-look ultrasound and ultrasound-guided biopsy is recommended. Small but asymmetric RIGHT axillary level 2 lymph nodes are also suspicious for malignancy but not amenable to percutaneous sampling.   4. Indeterminate RIGHT breast focus at the 9 o'clock periareolar position anterior depth measuring 3 mm and indeterminate RIGHT breast mass at the 9 o'clock central position at middle depth measuring 5 mm. If management would be impacted, MRI guided biopsies of the focus and mass at 9 o'clock in the RIGHT breast is recommended.   5. Confluent enhancement along the LEFT internal mammary vessels which could reflect ectatic vascular structures or an enlarged LEFT internal mammary lymph node. Recommend attention on forthcoming PET-CT.     09/10/2023 - 09/10/2023 Chemotherapy   Patient is on Treatment Plan : BREAST Fam-Trastuzumab Deruxtecan-nxki  (Enhertu ) (5.4) q21d     09/10/2023 Imaging   PET scan showed  1. Significant hypermetabolism involving skin thickening of the left breast laterally worrisome for recurrent breast cancer. 2. Extensive hypermetabolic right  axillary and subpectoral lymphadenopathy consistent with metastatic nodal disease. 3. Hypermetabolic left internal mammary lymphadenopathy. 4. No findings for abdominal/pelvic metastatic disease or osseous metastatic disease. 5. Large fluid collection in the left breast containing gas suggesting a postoperative fluid collection or abscess. 6. Benign hypermetabolic/brown fat in the neck and chest area.   09/27/2023 -  Chemotherapy   Patient is on Treatment Plan : BREAST Fam-Trastuzumab Deruxtecan-nxki  (Enhertu ) (5.4) q21d      + chronic nasal pressure, congestion, horse voice she attributes to sinusitis.   INTERVAL HISTORY Vanessa Fisher is a 63 y.o. female who has above history reviewed by me today presents for follow up visit for recurrent HER2 positive breast cancer  -  left breast persistent swelling with drainage in the site of previous biopsy, drainage has improved. Left breast pain has resolved.  During the interval patient went to ER on  10/04/2023 due to constipation, abdominal discomfort. CXR showed no obstruction.   10/11/2023 she went to ER again due to diarrhea. CT abdomen pelvis w contrast showed no obstruction. Diarrhea resolved after taking imodium .  Today patient reports no new complaints.  Denies any abdominal pain.  MEDICAL HISTORY:  Past Medical History:  Diagnosis Date   Anemia    Anxiety    Arthritis    Asthma    WELL CONTROLLED   Bile acid malabsorption syndrome    Bradycardia    HAD AN ISSUE WITH THIS IN 2016-NO PROBLEMS SINCE   Breast cancer, left breast (HCC) 08/2021   Carpal tunnel syndrome on right    Depression    Diabetes mellitus without complication (HCC)    Diverticulitis    Gastric reflux    GERD (gastroesophageal reflux disease)    Hand pain 05/12/2016   Headache    H/O MIGRAINES   History of kidney stones    H/O   Lumbar radiculitis    Neck pain, chronic    Osteoarthritis of both knees    Panic attacks    Personal history of  chemotherapy    Personal history of radiation therapy     SURGICAL HISTORY: Past Surgical History:  Procedure Laterality Date   BREAST BIOPSY Left 08/21/2021   Axilla Bx, Hydromarker, neg   BREAST BIOPSY Left 08/21/2021   US  Bx, Ribbon Clip, invasive mammary carcinoma   BREAST BIOPSY Left 03/12/2022   US  LT RADIO FREQUENCY TAG LOC US  GUIDE 03/12/2022 [facility]   BREAST BIOPSY Left 03/12/2022   US  LT RADIO FREQUENCY TAG EA ADD LESION LOC US  GUIDE 03/12/2022 [facility]   BREAST LUMPECTOMY Left 03/27/2022   CARPAL TUNNEL RELEASE Right 12/06/2017   Procedure: CARPAL TUNNEL RELEASE;  Surgeon: Cleotilde Barrio, MD;  Location: [facility];  Service: Orthopedics;  Laterality: Right;   COLONOSCOPY WITH PROPOFOL  N/A 06/11/2021   Procedure: COLONOSCOPY WITH PROPOFOL ;  Surgeon: Unk Corinn Skiff, MD;  Location[facility];  Service: Gastroenterology;  Laterality: N/A;   DORSAL COMPARTMENT RELEASE Right 12/06/2017   Procedure: RELEASE DORSAL COMPARTMENT (DEQUERVAIN);  Surgeon: Cleotilde Barrio, MD;  Location: [facility];  Service: Orthopedics;  Laterality: Right;   ESOPHAGOGASTRODUODENOSCOPY (EGD) WITH PROPOFOL  N/A 06/11/2021   Procedure: ESOPHAGOGASTRODUODENOSCOPY (EGD) WITH PROPOFOL ;  Surgeon: Unk Corinn Skiff, MD;  Location: [facility] ENDOSCOPY;  Service: Gastroenterology;  Laterality: N/A;   INCISION AND DRAINAGE, ABSCESS, BREAST Left 08/27/2023   Procedure: INCISION AND DRAINAGE, ABSCESS, BREAST;  Surgeon: Rodolph Romano, MD;  Location: [facility];  Service: General;  Laterality: Left;   PART MASTECTOMY,RADIO FREQUENCY LOCALIZER,AXILLARY SENTINEL NODE BIOPSY Left 03/27/2022   Procedure: PART MASTECTOMY,RADIO FREQUENCY LOCALIZER,AXILLARY SENTINEL NODE BIOPSY;  Surgeon: Rodolph Romano, MD;  Location: [facility];  Service: General;  Laterality: Left;   PARTIAL HYSTERECTOMY     PORTACATH PLACEMENT N/A 09/24/2021   Procedure: INSERTION PORT-A-CATH;  Surgeon: Rodolph Romano, MD;   Location: [facility];  Service: General;  Laterality: N/A;   TUBAL LIGATION      SOCIAL HISTORY: Social History   Socioeconomic History   Marital status: Single    Spouse name: Not on file   Number of children: Not on file   Years of education: Not on file   Highest education level: Not on file  Occupational History   Not on file  Tobacco Use   Smoking status: Former    Types: Cigarettes   Smokeless tobacco: Never  Vaping Use   Vaping status: Never Used  Substance and Sexual Activity  Alcohol use: No   Drug use: No   Sexual activity: Not on file  Other Topics Concern   Not on file  Social History Narrative   Lives alone   Social Drivers of Health   Financial Resource Strain: Low Risk  (10/08/2023)   Received from [facility]   Overall Financial Resource Strain (CARDIA)    Difficulty of Paying Living Expenses: Not very hard  Food Insecurity: Food Insecurity Present (10/08/2023)   Received from [facility]   Hunger Vital Sign    Within the past 12 months, you worried that your food would run out before you got the money to buy more.: Sometimes true    Within the past 12 months, the food you bought just didn't last and you didn't have money to get more.: Sometimes true  Transportation Needs: Unmet Transportation Needs (10/08/2023)   Received from [facility] - Transportation    In the past 12 months, has lack of transportation kept you from medical appointments or from getting medications?: Yes    Lack of Transportation (Non-Medical): Yes  Physical Activity: Inactive (08/15/2021)   Exercise Vital Sign    Days of Exercise per Week: 0 days    Minutes of Exercise per Session: 0 min  Stress: Stress Concern Present (08/15/2021)   [employer] of Occupational Health - Occupational Stress Questionnaire    Feeling of Stress : Rather much  Social Connections: Socially Isolated (08/15/2021)   Social Connection and  Isolation Panel    Frequency of Communication with Friends and Family: Once a week    Frequency of Social Gatherings with Friends and Family: Once a week    Attends Religious Services: Never    Database administrator or Organizations: No    Attends Engineer, structural: Not on file    Marital Status: Never married  Intimate Partner Violence: Not At Risk (08/15/2021)   Humiliation, Afraid, Rape, and Kick questionnaire    Fear of Current or Ex-Partner: No    Emotionally Abused: No    Physically Abused: No    Sexually Abused: No    FAMILY HISTORY: Family History  Problem Relation Age of Onset   Diabetes Mother    Hypertension Mother    Heart failure Mother    Pancreatic cancer Mother 16   Diabetes Father    Hypertension Father    Breast cancer Maternal Aunt    Cervical cancer Maternal Aunt    Lung cancer Son 28    ALLERGIES:  is allergic to bc powder [aspirin-salicylamide-caffeine], doxycycline , gabapentin , hydrocodone -acetaminophen , latex, penicillin g, penicillins, and shellfish allergy.  MEDICATIONS:  Current Outpatient Medications  Medication Sig Dispense Refill   albuterol  (PROVENTIL ) (2.5 MG/3ML) 0.083% nebulizer solution Take 3 mLs (2.5 mg total) by nebulization every 4 (four) hours as needed for wheezing or shortness of breath. 75 mL 2   albuterol  (VENTOLIN  HFA) 108 (90 Base) MCG/ACT inhaler Inhale 2 puffs into the lungs every 6 (six) hours as needed for wheezing or shortness of breath. 18 g 0   allopurinol (ZYLOPRIM) 100 MG tablet Take 100 mg by mouth daily.     atorvastatin (LIPITOR) 10 MG tablet Take 10 mg by mouth daily.     Azelastine  HCl 137 MCG/SPRAY SOLN Place 1 spray into the nose daily. 30 mL 0   calcium  carbonate (OS-CAL - DOSED IN MG OF ELEMENTAL CALCIUM ) 1250 (500 Ca) MG tablet Take 1 tablet (1,250 mg total) by  mouth daily. 90 tablet 1   celecoxib (CELEBREX) 200 MG capsule Take by mouth 2 (two) times daily.     cetirizine  (ZYRTEC  ALLERGY) 10 MG  tablet Take 1 tablet (10 mg total) by mouth daily. 90 tablet 0   cyclobenzaprine  (FLEXERIL ) 10 MG tablet Take 10 mg by mouth 3 (three) times daily.     dexamethasone  (DECADRON ) 4 MG tablet Take 2 tablets (8 mg) by mouth daily for 3 days starting the day after chemotherapy. Take with food. 30 tablet 1   diclofenac Sodium (VOLTAREN) 1 % GEL Apply topically.     diphenoxylate -atropine  (LOMOTIL ) 2.5-0.025 MG tablet Take 1 tablet by mouth 4 (four) times daily as needed for diarrhea or loose stools. 120 tablet 0   docusate sodium  (COLACE) 100 MG capsule Take 1 capsule (100 mg total) by mouth 2 (two) times daily.     DULoxetine (CYMBALTA) 20 MG capsule Take 20 mg by mouth.     fluticasone  (FLONASE ) 50 MCG/ACT nasal spray Place 2 sprays into both nostrils daily. 15.8 mL 0   KLOR-CON  M20 20 MEQ tablet TAKE 1 TABLET BY MOUTH TWICE A DAY 60 tablet 1   lidocaine -prilocaine  (EMLA ) cream Apply 1 Application topically as needed. Apply small amount to port and cover with saran wrap 1-2 hours prior to port access 30 g 12   losartan (COZAAR) 25 MG tablet Take 25 mg by mouth daily.     magnesium  chloride (SLOW-MAG) 64 MG TBEC SR tablet Take 1 tablet (64 mg total) by mouth 2 (two) times daily. 60 tablet 2   metoprolol succinate (TOPROL-XL) 25 MG 24 hr tablet Take 25 mg by mouth daily.     ondansetron  (ZOFRAN ) 8 MG tablet Take 1 tablet (8 mg total) by mouth every 8 (eight) hours as needed for nausea or vomiting. Start on the third day after chemotherapy. 30 tablet 1   oxyCODONE  HCl 10 MG TABA Take 10 mg by mouth every 4 (four) hours as needed. 14 tablet 0   pantoprazole (PROTONIX) 20 MG tablet Take 20 mg by mouth daily.     pregabalin (LYRICA) 50 MG capsule Take 50 mg by mouth 3 (three) times daily.     prochlorperazine  (COMPAZINE ) 10 MG tablet Take 1 tablet (10 mg total) by mouth every 6 (six) hours as needed for nausea or vomiting. 30 tablet 1   escitalopram  (LEXAPRO ) 10 MG tablet Take 1 tablet (10 mg total) by mouth  daily. (Patient not taking: Reported on 10/18/2023) 30 tablet 1   fluconazole  (DIFLUCAN ) 150 MG tablet Take 1 tablet (150 mg total) by mouth once a week. (Patient not taking: Reported on 10/18/2023) 2 tablet 0   naloxone (NARCAN) nasal spray 4 mg/0.1 mL  (Patient not taking: Reported on 10/18/2023)     No current facility-administered medications for this visit.   Facility-Administered Medications Ordered in Other Visits  Medication Dose Route Frequency Provider Last Rate Last Admin   magnesium  sulfate IVPB 2 g 50 mL  2 g Intravenous Once Covington, Sarah M, PA-C        Review of Systems  Constitutional:  Negative for appetite change, chills, fatigue and fever.  HENT:   Negative for hearing loss and voice change.   Eyes:  Negative for eye problems.  Respiratory:  Negative for chest tightness and cough.   Cardiovascular:  Negative for chest pain.  Gastrointestinal:  Negative for abdominal distention, abdominal pain, blood in stool and diarrhea.  Endocrine: Negative for hot flashes.  Genitourinary:  Negative for difficulty urinating and frequency.   Musculoskeletal:  Positive for arthralgias and back pain.       Leg cramps  Skin:  Negative for itching and rash.  Neurological:  Negative for extremity weakness and headaches.  Hematological:  Negative for adenopathy.  Psychiatric/Behavioral:  Negative for confusion.   Left breast pain   PHYSICAL EXAMINATION: ECOG PERFORMANCE STATUS: 1 - Symptomatic but completely ambulatory Vitals:   10/18/23 1054  BP: 112/65  Pulse: (!) 59  Resp: 19  Temp: 98.6 F (37 C)  SpO2: 98%   Filed Weights   10/18/23 1054  Weight: 235 lb 3.2 oz (106.7 kg)    Physical Exam Constitutional:      General: She is not in acute distress.    Appearance: She is not diaphoretic.  HENT:     Head: Normocephalic and atraumatic.  Eyes:     General: No scleral icterus. Cardiovascular:     Rate and Rhythm: Normal rate and regular rhythm.     Heart sounds: No  murmur heard. Pulmonary:     Effort: Pulmonary effort is normal. No respiratory distress.     Breath sounds: No wheezing.  Abdominal:     General: There is no distension.     Palpations: Abdomen is soft.     Tenderness: There is no abdominal tenderness.  Musculoskeletal:        General: Normal range of motion.     Cervical back: Normal range of motion and neck supple.  Skin:    General: Skin is warm and dry.     Findings: No erythema.  Neurological:     Mental Status: She is alert and oriented to person, place, and time.     Cranial Nerves: No cranial nerve deficit.     Motor: No abnormal muscle tone.     Coordination: Coordination normal.  Psychiatric:        Mood and Affect: Affect normal.     Comments:        LABORATORY DATA:  I have reviewed the data as listed    Latest Ref Rng & Units 10/18/2023   10:45 AM 10/11/2023    9:31 AM 10/04/2023    4:40 PM  CBC  WBC 4.0 - 10.5 K/uL 5.5  3.3    3.3  5.5   Hemoglobin 12.0 - 15.0 g/dL 89.6  89.6    89.5  88.6   Hematocrit 36.0 - 46.0 % 32.9  32.7    32.8  35.5   Platelets 150 - 400 K/uL 440  310    315  188       Latest Ref Rng & Units 10/18/2023   10:45 AM 10/11/2023    9:31 AM 10/04/2023    4:40 PM  CMP  Glucose 70 - 99 mg/dL 855  892  880   BUN 8 - 23 mg/dL 13  10  18    Creatinine 0.44 - 1.00 mg/dL 9.30  9.07  9.01   Sodium 135 - 145 mmol/L 134  139  134   Potassium 3.5 - 5.1 mmol/L 3.9  3.5  3.9   Chloride 98 - 111 mmol/L 102  104  97   CO2 22 - 32 mmol/L 25  26  27    Calcium  8.9 - 10.3 mg/dL 8.8  8.6  9.1   Total Protein 6.5 - 8.1 g/dL 6.7  6.4  7.3   Total Bilirubin 0.0 - 1.2 mg/dL 0.3  0.5  0.4   Alkaline Phos  38 - 126 U/L 100  80  92   AST 15 - 41 U/L 22  20  20    ALT 0 - 44 U/L 22  20  19        RADIOGRAPHIC STUDIES: I have personally reviewed the radiological images as listed and agreed with the findings in the report. CT ABDOMEN PELVIS W CONTRAST Result Date: 10/11/2023 CLINICAL DATA:  Vomiting and  diarrhea. EXAM: CT ABDOMEN AND PELVIS WITH CONTRAST TECHNIQUE: Multidetector CT imaging of the abdomen and pelvis was performed using the standard protocol following bolus administration of intravenous contrast. RADIATION DOSE REDUCTION: This exam was performed according to the departmental dose-optimization program which includes automated exposure control, adjustment of the mA and/or kV according to patient size and/or use of iterative reconstruction technique. CONTRAST:  OMNIPAQUE  IOHEXOL  300 MG/ML  SOLN COMPARISON:  April 17, 2021 FINDINGS: Lower chest: No acute abnormality. Hepatobiliary: No focal liver abnormality is seen. No gallstones, gallbladder wall thickening, or biliary dilatation. Pancreas: Unremarkable. No pancreatic ductal dilatation or surrounding inflammatory changes. Spleen: Normal in size without focal abnormality. Adrenals/Urinary Tract: Adrenal glands are unremarkable. Kidneys are normal, without renal calculi, focal lesion, or hydronephrosis. Bladder is unremarkable. Stomach/Bowel: There is a small hiatal hernia. Appendix appears normal. No evidence of bowel wall thickening, distention, or inflammatory changes. Vascular/Lymphatic: No significant vascular findings are present. No enlarged abdominal or pelvic lymph nodes. Reproductive: Status post hysterectomy. No adnexal masses. Other: No abdominal wall hernia or abnormality. No abdominopelvic ascites. Musculoskeletal: Moderate to marked severity degenerative changes are seen at the level of L5-S1. IMPRESSION: 1. Small hiatal hernia. 2. 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 Electronically Signed   By: Suzen Dials M.D.   On: 10/11/2023 12:53   DG Abdomen 1 View Result Date: 10/04/2023 CLINICAL DATA:  Constipation. EXAM: ABDOMEN - 1 VIEW COMPARISON:  05/20/2021, 04/17/2021. FINDINGS: The bowel gas pattern is normal. A mild amount of retained stool is present in the colon. No radio-opaque calculi or  other acute radiographic abnormality are seen. Degenerative changes are noted in the lumbar spine IMPRESSION: 1. No evidence of bowel obstruction. 2. Mild amount of retained stool in the colon. Electronically Signed   By: Leita Birmingham M.D.   On: 10/04/2023 17:26   NM Cardiac Muga Rest Result Date: 09/23/2023 CLINICAL DATA:  Breast cancer. Evaluate cardiac function in relation to chemotherapy. EXAM: NUCLEAR MEDICINE CARDIAC BLOOD POOL IMAGING (MUGA) TECHNIQUE: Cardiac multi-gated acquisition was performed at rest following intravenous injection of Tc-48m labeled red blood cells. RADIOPHARMACEUTICALS:  21.6 mCi Tc-49m pertechnetate in-vitro labeled red blood cells IV COMPARISON:  None Available. FINDINGS: No  focal wall motion abnormality of the left ventricle. Calculated left ventricular ejection fraction equals 52.6% IMPRESSION: Left ventricular ejection fraction equals52.6 %. Electronically Signed   By: Jackquline Boxer M.D.   On: 09/23/2023 12:26

## 2023-10-18 NOTE — Assessment & Plan Note (Signed)
 Treatment plan as listed above.

## 2023-10-18 NOTE — Progress Notes (Signed)
 [facility] CSW Progress Note  Visual merchandiser met with patient in infusion to follow-up on assessment of psychosocial needs.    Interventions: Provided brief mental health counseling with regard to changes in her condition.  She stated she is taking things one day at a time and that her faith is very important to her.  She agreed to receive a [employer] card.  Referred to [facility].  Her family remains supportive.        Follow Up Plan:  CSW will follow-up with patient by phone     Vanessa Fisher Au, LCSW Clinical Social Worker [facility]    Patient is participating in a Managed Medicaid Plan:  Yes

## 2023-10-18 NOTE — Progress Notes (Signed)
 Palliative Medicine [facility] at [facility] [phone] [phone]   Name: KRYSTI HICKLING Date: 10/18/2023 MRN: [id number]  DOB: [birth year]  Patient Care Team: [facility] as PCP - General Georgina Shasta POUR, RN as Oncology Nurse Navigator [name], MD as Consulting Physician (Oncology) Lenn Aran, MD as Radiation Oncologist (Radiation Oncology)    REASON FOR CONSULTATION: BEYOUNCE DICKENS is a 63 y.o. 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  Palliative care consulted to address goals.  SOCIAL HISTORY:     reports that she has quit smoking. Her smoking use included cigarettes. She has never used smokeless tobacco. She reports that she does not drink alcohol and does not use drugs.  Patient is unmarried.  She lives at home alone.  Her oldest son died of cancer.  She has a son and daughter who live nearby.  She also has a sister who is involved.  Patient previously worked at [employer].  ADVANCE DIRECTIVES:    CODE STATUS:   PAST MEDICAL HISTORY: Past Medical History:  Diagnosis Date   Anemia    Anxiety    Arthritis    Asthma    WELL CONTROLLED   Bile acid malabsorption syndrome    Bradycardia    HAD AN ISSUE WITH THIS IN 2016-NO PROBLEMS SINCE   Breast cancer, left breast (HCC) 08/2021   Carpal tunnel syndrome on right    Depression    Diabetes mellitus without complication (HCC)    Diverticulitis    Gastric reflux    GERD (gastroesophageal reflux disease)    Hand pain 05/12/2016   Headache    H/O MIGRAINES   History of kidney stones    H/O   Lumbar radiculitis    Neck pain, chronic    Osteoarthritis of both knees    Panic attacks    Personal history of chemotherapy    Personal history of radiation therapy     PAST SURGICAL HISTORY:  Past Surgical History:  Procedure Laterality Date   BREAST BIOPSY Left 08/21/2021   Axilla  Bx, Hydromarker, neg   BREAST BIOPSY Left 08/21/2021   US  Bx, Ribbon Clip, invasive mammary carcinoma   BREAST BIOPSY Left 03/12/2022   US  LT RADIO FREQUENCY TAG LOC US  GUIDE 03/12/2022 [facility]   BREAST BIOPSY Left 03/12/2022   US  LT RADIO FREQUENCY TAG EA ADD LESION LOC US  GUIDE 03/12/2022 [facility]   BREAST LUMPECTOMY Left 03/27/2022   CARPAL TUNNEL RELEASE Right 12/06/2017   Procedure: CARPAL TUNNEL RELEASE;  Surgeon: Cleotilde Barrio, MD;  Location: [facility];  Service: Orthopedics;  Laterality: Right;   COLONOSCOPY WITH PROPOFOL  N/A 06/11/2021   Procedure: COLONOSCOPY WITH PROPOFOL ;  Surgeon: Unk Corinn Skiff, MD;  Location: [facility] ENDOSCOPY;  Service: Gastroenterology;  Laterality: N/A;   DORSAL COMPARTMENT RELEASE Right 12/06/2017   Procedure: RELEASE DORSAL COMPARTMENT (DEQUERVAIN);  Surgeon: Cleotilde Barrio, MD;  Location: [facility];  Service: Orthopedics;  Laterality: Right;   ESOPHAGOGASTRODUODENOSCOPY (EGD) WITH PROPOFOL  N/A 06/11/2021   Procedure: ESOPHAGOGASTRODUODENOSCOPY (EGD) WITH PROPOFOL ;  Surgeon: Unk Corinn Skiff, MD;  Location: [facility] ENDOSCOPY;  Service: Gastroenterology;  Laterality: N/A;   INCISION AND DRAINAGE, ABSCESS, BREAST Left 08/27/2023   Procedure: INCISION AND DRAINAGE, ABSCESS, BREAST;  Surgeon: Rodolph Romano, MD;  Location: [facility];  Service: General;  Laterality: Left;   PART MASTECTOMY,RADIO FREQUENCY LOCALIZER,AXILLARY SENTINEL NODE BIOPSY Left 03/27/2022   Procedure: PART MASTECTOMY,RADIO FREQUENCY LOCALIZER,AXILLARY SENTINEL NODE BIOPSY;  Surgeon: Rodolph Romano, MD;  Location: [facility];  Service: General;  Laterality: Left;   PARTIAL HYSTERECTOMY     PORTACATH PLACEMENT N/A 09/24/2021   Procedure: INSERTION PORT-A-CATH;  Surgeon: Rodolph Romano, MD;  Location: [facility];  Service: General;  Laterality: N/A;   TUBAL LIGATION      HEMATOLOGY/ONCOLOGY HISTORY:  Oncology History  Breast cancer (HCC)  07/31/2021 Imaging    Bilateral diagnostic mammogram and US  showed 5 centimeter LEFT breast mass associated with pleomorphic calcifications is suspicious for invasive ductal carcinoma.At least 4 LEFT axillary lymph nodes with abnormal morphology   08/21/2021 Cancer Staging   Staging form: Breast, AJCC 8th Edition - Clinical: Stage IIIB (cT4b, cN3c, cM0, G3, ER-, PR-, HER2+) - Signed by [name], MD on 08/28/2021 Histologic grading system: 3 grade system  -08/21/21 left breast ultrasound-guided biopsy showed invasive mammary carcinoma, grade 3, ER/PR negative, HER2 positive.  Left axillary lymph node biopsy positive for macro metastatic mammary carcinoma, 8 mm in greatest extent.   08/30/2021 Imaging   MRI brain w wo contrast -No metastatic disease or acute intracranial abnormality. Essentially normal for age MRI appearance of the brain.    09/03/2021 Echocardiogram   1. Left ventricular ejection fraction, by estimation, is 55 to 60%. Left ventricular ejection fraction by 3D volume is 58 %. The left ventricle has normal function. The left ventricle has no regional wall motion abnormalities. There is mild left  ventricular hypertrophy. Left ventricular diastolic parameters were normal.  2. Right ventricular systolic function is normal. The right ventricular size is normal.  3. The mitral valve is normal in structure. No evidence of mitral valve regurgitation.  4. The aortic valve was not well visualized. Aortic valve regurgitation is not visualized   09/10/2021 Imaging   PET scan showed Large hypermetabolic left breast mass and diffuse skin thickening,consistent with primary breast carcinoma. Hypermetabolic lymphadenopathy in the left axilla, left subpectoral region, and left supraclavicular region, consistent with metastatic disease. No evidence of metastatic disease within the abdomen or pelvis   09/12/2021 - 02/13/2022 Chemotherapy   Patient is on Treatment Plan : BREAST  Docetaxel  + Carboplatin  + Trastuzumab  +  Pertuzumab   (TCHP) q21d       Genetic Testing   Negative genetic testing. No pathogenic variants identified on the [facility] Common Hereditary Cancers+RNA panel. The report date is 10/26/2021.  The Common Hereditary Cancers Panel + RNA offered by [facility] includes sequencing and/or deletion duplication testing of the following 47 genes: APC, ATM, AXIN2, BARD1, BMPR1A, BRCA1, BRCA2, BRIP1, CDH1, CDKN2A (p14ARF), CDKN2A (p16INK4a), CKD4, CHEK2, CTNNA1, DICER1, EPCAM (Deletion/duplication testing only), GREM1 (promoter region deletion/duplication testing only), KIT, MEN1, MLH1, MSH2, MSH3, MSH6, MUTYH, NBN, NF1, NHTL1, PALB2, PDGFRA, PMS2, POLD1, POLE, PTEN, RAD50, RAD51C, RAD51D, SDHB, SDHC, SDHD, SMAD4, SMARCA4. STK11, TP53, TSC1, TSC2, and VHL.  The following genes were evaluated for sequence changes only: SDHA and HOXB13 c.251G>A variant only.   02/09/2022 Echocardiogram   LVEF 55 to 60%    03/27/2022 Surgery   S/p left mastectomy + SLNB ypT0 ypN0,    04/24/2022 - 12/23/2022 Chemotherapy   Patient is on Treatment Plan : BREAST Trastuzumab   + Pertuzumab  q21d x 13 cycles     05/06/2022 Echocardiogram   LVEF 55 to 60%    07/02/2022 - 08/03/2022 Radiation Therapy   Adjuvant breast radiation.    10/20/2022 Imaging   Bone scan whole body showed 1. Degenerative uptake at the sternoclavicular joints, shoulders,knees and hindfoot bilaterally. 2. No evidence for metastatic disease  to the. 3. Uptake in the left breast may be related to postsurgical change or residual tumor.   07/28/2023 Mammogram   Unilateral left breast mammogram showed  1. Diffuse MEDIAL LEFT breast erythema and pain without interval change in mammographic appearance or suspicious sonographic abnormality. Stable skin thickening in this area is presumably due to prior radiation therapy. Findings are most suspicious for cellulitis. Recommend treatment with antibiotics. If antibiotics fail to resolve the erythema, surgical or  dermatologic consultation for skin punch biopsy is recommended to exclude inflammatory breast cancer.   2. Redemonstrated LEFT breast seroma at the lumpectomy site, now with complex internal debris. Patient requests aspiration of the fluid component of this collection, which will be attempted under ultrasound guidance.   08/27/2023 Relapse/Recurrence   08/27/2023, left breast 11:00 punch biopsy showed 1. Breast, biopsy, left 11:00 punch BX :       - INVASIVE GRADE 3 MAMMARY CARCINOMA  INVADING SKIN AND EPIDERMIS WITH       ULCERATION  ER 70% positive, PR 0%, HER2 positive(3+).    09/08/2023 right axilla lymph node biopsy showed 1. Lymph node, needle/core biopsy, right axilla, hydromark coil clip :       ONE LYMPH NODE, POSITIVE FOR METASTATIC CARCINOMA MORPHOLOGICALLY COMPATIBLE       WITH BREAST PRIMARY (1/1).       METASTATIC FOCUS: 11 MM / 1.1 CM.       EXTRANODAL EXTENSION IDENTIFIE    09/01/2023 Imaging   MRI breast bilaterally without contrast showed  1. Widespread abnormal enhancement involving the skin and nipple of the LEFT breast, involving all 4 quadrants and compatible with known, biopsy proven LEFT breast cancer invading the skin. There is likely slight RETROAREOLAR extension of the nipple involvement extending 8 mm posteriorly from the nipple.   2. Post treatment changes of the LEFT breast without other evidence of recurrent malignancy in the LEFT breast. Thin peripheral enhancement at the lumpectomy site is nonspecific but favored to be due to recent instrumentation.   3. Markedly enlarged RIGHT axillary level 1 lymph nodes, for which second-look ultrasound and ultrasound-guided biopsy is recommended. Small but asymmetric RIGHT axillary level 2 lymph nodes are also suspicious for malignancy but not amenable to percutaneous sampling.   4. Indeterminate RIGHT breast focus at the 9 o'clock periareolar position anterior depth measuring 3 mm and indeterminate  RIGHT breast mass at the 9 o'clock central position at middle depth measuring 5 mm. If management would be impacted, MRI guided biopsies of the focus and mass at 9 o'clock in the RIGHT breast is recommended.   5. Confluent enhancement along the LEFT internal mammary vessels which could reflect ectatic vascular structures or an enlarged LEFT internal mammary lymph node. Recommend attention on forthcoming PET-CT.     09/10/2023 - 09/10/2023 Chemotherapy   Patient is on Treatment Plan : BREAST Fam-Trastuzumab Deruxtecan-nxki  (Enhertu ) (5.4) q21d     09/10/2023 Imaging   PET scan showed  1. Significant hypermetabolism involving skin thickening of the left breast laterally worrisome for recurrent breast cancer. 2. Extensive hypermetabolic right axillary and subpectoral lymphadenopathy consistent with metastatic nodal disease. 3. Hypermetabolic left internal mammary lymphadenopathy. 4. No findings for abdominal/pelvic metastatic disease or osseous metastatic disease. 5. Large fluid collection in the left breast containing gas suggesting a postoperative fluid collection or abscess. 6. Benign hypermetabolic/brown fat in the neck and chest area.   09/27/2023 -  Chemotherapy   Patient is on Treatment Plan : BREAST Fam-Trastuzumab Deruxtecan-nxki  (Enhertu ) (5.4) q21d  ALLERGIES:  is allergic to bc powder [aspirin-salicylamide-caffeine], doxycycline , gabapentin , hydrocodone -acetaminophen , latex, penicillin g, penicillins, and shellfish allergy.  MEDICATIONS:  Current Outpatient Medications  Medication Sig Dispense Refill   albuterol  (PROVENTIL ) (2.5 MG/3ML) 0.083% nebulizer solution Take 3 mLs (2.5 mg total) by nebulization every 4 (four) hours as needed for wheezing or shortness of breath. 75 mL 2   albuterol  (VENTOLIN  HFA) 108 (90 Base) MCG/ACT inhaler Inhale 2 puffs into the lungs every 6 (six) hours as needed for wheezing or shortness of breath. 18 g 0   allopurinol (ZYLOPRIM) 100 MG  tablet Take 100 mg by mouth daily.     atorvastatin (LIPITOR) 10 MG tablet Take 10 mg by mouth daily.     Azelastine  HCl 137 MCG/SPRAY SOLN Place 1 spray into the nose daily. 30 mL 0   calcium  carbonate (OS-CAL - DOSED IN MG OF ELEMENTAL CALCIUM ) 1250 (500 Ca) MG tablet Take 1 tablet (1,250 mg total) by mouth daily. 90 tablet 1   celecoxib (CELEBREX) 200 MG capsule Take by mouth 2 (two) times daily.     cetirizine  (ZYRTEC  ALLERGY) 10 MG tablet Take 1 tablet (10 mg total) by mouth daily. 90 tablet 0   cyclobenzaprine  (FLEXERIL ) 10 MG tablet Take 10 mg by mouth 3 (three) times daily.     dexamethasone  (DECADRON ) 4 MG tablet Take 2 tablets (8 mg) by mouth daily for 3 days starting the day after chemotherapy. Take with food. 30 tablet 1   diclofenac Sodium (VOLTAREN) 1 % GEL Apply topically.     diphenoxylate -atropine  (LOMOTIL ) 2.5-0.025 MG tablet Take 1 tablet by mouth 4 (four) times daily as needed for diarrhea or loose stools. 120 tablet 0   docusate sodium  (COLACE) 100 MG capsule Take 1 capsule (100 mg total) by mouth 2 (two) times daily.     DULoxetine (CYMBALTA) 20 MG capsule Take 20 mg by mouth.     escitalopram  (LEXAPRO ) 10 MG tablet Take 1 tablet (10 mg total) by mouth daily. (Patient not taking: Reported on 10/18/2023) 30 tablet 1   fluconazole  (DIFLUCAN ) 150 MG tablet Take 1 tablet (150 mg total) by mouth once a week. (Patient not taking: Reported on 10/18/2023) 2 tablet 0   fluticasone  (FLONASE ) 50 MCG/ACT nasal spray Place 2 sprays into both nostrils daily. 15.8 mL 0   KLOR-CON  M20 20 MEQ tablet TAKE 1 TABLET BY MOUTH TWICE A DAY 60 tablet 1   lidocaine -prilocaine  (EMLA ) cream Apply 1 Application topically as needed. Apply small amount to port and cover with saran wrap 1-2 hours prior to port access 30 g 12   losartan (COZAAR) 25 MG tablet Take 25 mg by mouth daily.     magnesium  chloride (SLOW-MAG) 64 MG TBEC SR tablet Take 1 tablet (64 mg total) by mouth 2 (two) times daily. 60 tablet 2    metoprolol succinate (TOPROL-XL) 25 MG 24 hr tablet Take 25 mg by mouth daily.     naloxone (NARCAN) nasal spray 4 mg/0.1 mL  (Patient not taking: Reported on 10/18/2023)     ondansetron  (ZOFRAN ) 8 MG tablet Take 1 tablet (8 mg total) by mouth every 8 (eight) hours as needed for nausea or vomiting. Start on the third day after chemotherapy. 30 tablet 1   oxyCODONE  HCl 10 MG TABA Take 10 mg by mouth every 4 (four) hours as needed. 14 tablet 0   pantoprazole (PROTONIX) 20 MG tablet Take 20 mg by mouth daily.     pregabalin (LYRICA) 50 MG capsule Take  50 mg by mouth 3 (three) times daily.     prochlorperazine  (COMPAZINE ) 10 MG tablet Take 1 tablet (10 mg total) by mouth every 6 (six) hours as needed for nausea or vomiting. 30 tablet 1   No current facility-administered medications for this visit.   Facility-Administered Medications Ordered in Other Visits  Medication Dose Route Frequency Provider Last Rate Last Admin   dextrose  5 % solution   Intravenous Continuous [name], MD 10 mL/hr at 10/18/23 1250 New Bag at 10/18/23 1250   magnesium  sulfate IVPB 2 g 50 mL  2 g Intravenous Once Covington, Sarah M, PA-C        VITAL SIGNS: There were no vitals taken for this visit. There were no vitals filed for this visit.  Estimated body mass index is 37.96 kg/m as calculated from the following:   Height as of 10/11/23: 5' 6 (1.676 m).   Weight as of an earlier encounter on 10/18/23: 235 lb 3.2 oz (106.7 kg).  LABS: CBC:    Component Value Date/Time   WBC 5.5 10/18/2023 1045   WBC 3.3 (L) 10/11/2023 0931   WBC 3.3 (L) 10/11/2023 0931   HGB 10.3 (L) 10/18/2023 1045   HGB 11.9 (L) 05/27/2014 2215   HCT 32.9 (L) 10/18/2023 1045   HCT 36.4 05/27/2014 2215   PLT 440 (H) 10/18/2023 1045   PLT 293 05/27/2014 2215   MCV 82.3 10/18/2023 1045   MCV 81 05/27/2014 2215   NEUTROABS 4.1 10/18/2023 1045   NEUTROABS 5.4 05/27/2014 2215   LYMPHSABS 1.0 10/18/2023 1045   LYMPHSABS 3.0 05/27/2014 2215    MONOABS 0.1 10/18/2023 1045   MONOABS 0.6 05/27/2014 2215   EOSABS 0.0 10/18/2023 1045   EOSABS 0.0 05/27/2014 2215   BASOSABS 0.0 10/18/2023 1045   BASOSABS 0.0 05/27/2014 2215   Comprehensive Metabolic Panel:    Component Value Date/Time   NA 134 (L) 10/18/2023 1045   NA 143 07/08/2016 1259   NA 139 05/27/2014 2215   K 3.9 10/18/2023 1045   K 3.5 05/27/2014 2215   CL 102 10/18/2023 1045   CL 105 05/27/2014 2215   CO2 25 10/18/2023 1045   CO2 26 05/27/2014 2215   BUN 13 10/18/2023 1045   BUN 10 07/08/2016 1259   BUN 14 05/27/2014 2215   CREATININE 0.69 10/18/2023 1045   CREATININE 1.09 (H) 05/27/2014 2215   GLUCOSE 144 (H) 10/18/2023 1045   GLUCOSE 104 (H) 05/27/2014 2215   CALCIUM  8.8 (L) 10/18/2023 1045   CALCIUM  9.0 05/27/2014 2215   AST 22 10/18/2023 1045   ALT 22 10/18/2023 1045   ALT 24 08/05/2013 0832   ALKPHOS 100 10/18/2023 1045   ALKPHOS 97 08/05/2013 0832   BILITOT 0.3 10/18/2023 1045   PROT 6.7 10/18/2023 1045   PROT 7.0 07/08/2016 1259   PROT 6.8 08/05/2013 0832   ALBUMIN 3.3 (L) 10/18/2023 1045   ALBUMIN 4.0 07/08/2016 1259   ALBUMIN 3.4 08/05/2013 0832    RADIOGRAPHIC STUDIES: CT ABDOMEN PELVIS W CONTRAST Result Date: 10/11/2023 CLINICAL DATA:  Vomiting and diarrhea. EXAM: CT ABDOMEN AND PELVIS WITH CONTRAST TECHNIQUE: Multidetector CT imaging of the abdomen and pelvis was performed using the standard protocol following bolus administration of intravenous contrast. RADIATION DOSE REDUCTION: This exam was performed according to the departmental dose-optimization program which includes automated exposure control, adjustment of the mA and/or kV according to patient size and/or use of iterative reconstruction technique. CONTRAST:  100mL OMNIPAQUE  IOHEXOL  300 MG/ML  SOLN COMPARISON:  April 17, 2021 FINDINGS: Lower chest: No acute abnormality. Hepatobiliary: No focal liver abnormality is seen. No gallstones, gallbladder wall thickening, or biliary dilatation.  Pancreas: Unremarkable. No pancreatic ductal dilatation or surrounding inflammatory changes. Spleen: Normal in size without focal abnormality. Adrenals/Urinary Tract: Adrenal glands are unremarkable. Kidneys are normal, without renal calculi, focal lesion, or hydronephrosis. Bladder is unremarkable. Stomach/Bowel: There is a small hiatal hernia. Appendix appears normal. No evidence of bowel wall thickening, distention, or inflammatory changes. Vascular/Lymphatic: No significant vascular findings are present. No enlarged abdominal or pelvic lymph nodes. Reproductive: Status post hysterectomy. No adnexal masses. Other: No abdominal wall hernia or abnormality. No abdominopelvic ascites. Musculoskeletal: Moderate to marked severity degenerative changes are seen at the level of L5-S1. IMPRESSION: 1. Small hiatal hernia. 2. 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 Electronically Signed   By: Suzen Dials M.D.   On: 10/11/2023 12:53   DG Abdomen 1 View Result Date: 10/04/2023 CLINICAL DATA:  Constipation. EXAM: ABDOMEN - 1 VIEW COMPARISON:  05/20/2021, 04/17/2021. FINDINGS: The bowel gas pattern is normal. A mild amount of retained stool is present in the colon. No radio-opaque calculi or other acute radiographic abnormality are seen. Degenerative changes are noted in the lumbar spine IMPRESSION: 1. No evidence of bowel obstruction. 2. Mild amount of retained stool in the colon. Electronically Signed   By: Leita Birmingham M.D.   On: 10/04/2023 17:26   NM Cardiac Muga Rest Result Date: 09/23/2023 CLINICAL DATA:  Breast cancer. Evaluate cardiac function in relation to chemotherapy. EXAM: NUCLEAR MEDICINE CARDIAC BLOOD POOL IMAGING (MUGA) TECHNIQUE: Cardiac multi-gated acquisition was performed at rest following intravenous injection of Tc-77m labeled red blood cells. RADIOPHARMACEUTICALS:  21.6 mCi Tc-42m pertechnetate in-vitro labeled red blood cells IV COMPARISON:  None Available.  FINDINGS: No  focal wall motion abnormality of the left ventricle. Calculated left ventricular ejection fraction equals 52.6% IMPRESSION: Left ventricular ejection fraction equals52.6 %. Electronically Signed   By: Jackquline Boxer M.D.   On: 09/23/2023 12:26    PERFORMANCE STATUS (ECOG) : 1 - Symptomatic but completely ambulatory  Review of Systems Unless otherwise noted, a complete review of systems is negative.  Physical Exam General: NAD Cardiovascular: regular rate and rhythm Pulmonary: clear ant fields Abdomen: soft, nontender, + bowel sounds GU: no suprapubic tenderness Extremities: no edema, no joint deformities Skin: no rashes Neurological: Weakness but otherwise nonfocal  IMPRESSION: Patient seen in infusion.  She was followed by me previously several years ago with initial stage IIIb breast cancer.  Patient underwent neoadjuvant chemotherapy/lumpectomy/adjuvant immunotherapy and radiation.  Unfortunately, now with recurrent stage IV breast cancer with PET positive involvement bilateral breast and lymphadenopathy.  Patient on treatment with Fam-trastuzumab  deruxtecan- Enhertu .  Patient understands that stage IV disease is inherently incurable.  However, she verbalizes agreement with current scope of treatment.  She says that she is praying for a miracle.  So far, she says that she is tolerating chemotherapy well.  Denies any adverse effects or significant symptomatic burden.  At baseline, she lives at home alone.  However, her son lives nearby and checks on her regularly.  She also has a daughter who is involved.  Patient says that she wants to maintain her own independence with her care.  Patient feels she is coping well.  She denies anxiety or depression.  She says that she is sleeping well.  Patient does endorse recent constipation but says that that has improved with regular bowel regimen.  Appetite has declined  somewhat.  PLAN: - Continue current scope of treatment -  Referral to nutrition, [facility], social work - Follow-up telephone visit 1 month   Patient expressed understanding and was in agreement with this plan. She also understands that She can call the clinic at any time with any questions, concerns, or complaints.     Time Total: 15 minutes  Visit consisted of counseling and education dealing with the complex and emotionally intense issues of symptom management and palliative care in the setting of serious and potentially life-threatening illness.Greater than 50%  of this time was spent counseling and coordinating care related to the above assessment and plan.  Signed by: Fonda Mower, PhD, NP-C

## 2023-10-18 NOTE — Assessment & Plan Note (Signed)
 Left breast cancer, cT4b N3 Mx, ER/PR-, HER2 + S/p neoadjuvant chemotherapy 6 cycles of TCHP  S/p left lumpectomy  + SLNB -ypT0 ypN0, complete pathological response.  S/p adjuvant Transtuzumab and Pertuzumab  11 cycles. S/p adjuvant radiation-  Declined adjuvant Zometa. Recurrent Stage IV breast cancer. 08/27/2023 left breast skin punch biopsy showed ER+ PR + HER2 (3+) breast carcinoma 09/08/2023, right axillary lymph node biopsy showed .  ER- PR-  HER2 (3+)  breast carcinoma  PET scan showed recurrence in bilateral breast and lymphadenopathy. MRI brain negative.   Recommend Fam-trastuzumab  deruxtecan- Enhertu ,  Labs are reviewed and discussed with patient.  She tolerates treatment with some moderate difficulties. Proceed with cycle 2 treatments.   LVEF has decreased comparing to prior.  Refer to cardiology for further evaluation.

## 2023-10-18 NOTE — Assessment & Plan Note (Signed)
 Continue Slow Mag 1 tab BID

## 2023-10-19 LAB — CANCER ANTIGEN 15-3: CA 15-3: 21.5 U/mL (ref 0.0–25.0)

## 2023-10-25 ENCOUNTER — Encounter: Payer: Self-pay | Admitting: Oncology

## 2023-10-25 ENCOUNTER — Inpatient Hospital Stay (HOSPITAL_BASED_OUTPATIENT_CLINIC_OR_DEPARTMENT_OTHER): Admitting: Nurse Practitioner

## 2023-10-25 ENCOUNTER — Other Ambulatory Visit: Payer: Self-pay | Admitting: *Deleted

## 2023-10-25 ENCOUNTER — Other Ambulatory Visit: Payer: Self-pay

## 2023-10-25 ENCOUNTER — Telehealth: Payer: Self-pay | Admitting: *Deleted

## 2023-10-25 ENCOUNTER — Inpatient Hospital Stay

## 2023-10-25 ENCOUNTER — Encounter: Payer: Self-pay | Admitting: Nurse Practitioner

## 2023-10-25 ENCOUNTER — Ambulatory Visit

## 2023-10-25 VITALS — BP 120/63 | HR 63 | Temp 98.1°F | Resp 18 | Ht 66.0 in | Wt 234.0 lb

## 2023-10-25 DIAGNOSIS — K521 Toxic gastroenteritis and colitis: Secondary | ICD-10-CM

## 2023-10-25 DIAGNOSIS — E876 Hypokalemia: Secondary | ICD-10-CM

## 2023-10-25 DIAGNOSIS — C50011 Malignant neoplasm of nipple and areola, right female breast: Secondary | ICD-10-CM

## 2023-10-25 DIAGNOSIS — T451X5A Adverse effect of antineoplastic and immunosuppressive drugs, initial encounter: Secondary | ICD-10-CM

## 2023-10-25 DIAGNOSIS — Z171 Estrogen receptor negative status [ER-]: Secondary | ICD-10-CM

## 2023-10-25 DIAGNOSIS — Z5112 Encounter for antineoplastic immunotherapy: Secondary | ICD-10-CM | POA: Diagnosis not present

## 2023-10-25 DIAGNOSIS — R197 Diarrhea, unspecified: Secondary | ICD-10-CM

## 2023-10-25 DIAGNOSIS — K1231 Oral mucositis (ulcerative) due to antineoplastic therapy: Secondary | ICD-10-CM | POA: Diagnosis not present

## 2023-10-25 LAB — COMPREHENSIVE METABOLIC PANEL WITH GFR
ALT: 23 U/L (ref 0–44)
AST: 23 U/L (ref 15–41)
Albumin: 2.9 g/dL — ABNORMAL LOW (ref 3.5–5.0)
Alkaline Phosphatase: 84 U/L (ref 38–126)
Anion gap: 9 (ref 5–15)
BUN: 36 mg/dL — ABNORMAL HIGH (ref 8–23)
CO2: 21 mmol/L — ABNORMAL LOW (ref 22–32)
Calcium: 8.3 mg/dL — ABNORMAL LOW (ref 8.9–10.3)
Chloride: 100 mmol/L (ref 98–111)
Creatinine, Ser: 1.04 mg/dL — ABNORMAL HIGH (ref 0.44–1.00)
GFR, Estimated: >60 mL/min (ref >60)
Glucose, Bld: 129 mg/dL — ABNORMAL HIGH (ref 70–99)
Potassium: 3.8 mmol/L (ref 3.5–5.1)
Sodium: 130 mmol/L — ABNORMAL LOW (ref 135–145)
Total Bilirubin: 0.6 mg/dL (ref 0.0–1.2)
Total Protein: 6.1 g/dL — ABNORMAL LOW (ref 6.5–8.1)

## 2023-10-25 LAB — CBC WITH DIFFERENTIAL/PLATELET
Abs Immature Granulocytes: 0.05 K/uL (ref 0.00–0.07)
Basophils Absolute: 0 K/uL (ref 0.0–0.1)
Basophils Relative: 0 %
Eosinophils Absolute: 0 K/uL (ref 0.0–0.5)
Eosinophils Relative: 0 %
HCT: 36.8 % (ref 36.0–46.0)
Hemoglobin: 11.9 g/dL — ABNORMAL LOW (ref 12.0–15.0)
Immature Granulocytes: 1 %
Lymphocytes Relative: 12 %
Lymphs Abs: 1 K/uL (ref 0.7–4.0)
MCH: 25.6 pg — ABNORMAL LOW (ref 26.0–34.0)
MCHC: 32.3 g/dL (ref 30.0–36.0)
MCV: 79.3 fL — ABNORMAL LOW (ref 80.0–100.0)
Monocytes Absolute: 0.2 K/uL (ref 0.1–1.0)
Monocytes Relative: 2 %
Neutro Abs: 7 K/uL (ref 1.7–7.7)
Neutrophils Relative %: 85 %
Platelets: 230 K/uL (ref 150–400)
RBC: 4.64 MIL/uL (ref 3.87–5.11)
RDW: 15.8 % — ABNORMAL HIGH (ref 11.5–15.5)
WBC: 8.3 K/uL (ref 4.0–10.5)
nRBC: 0 % (ref 0.0–0.2)

## 2023-10-25 LAB — MAGNESIUM: Magnesium: 2.3 mg/dL (ref 1.7–2.4)

## 2023-10-25 MED ORDER — LOPERAMIDE HCL 2 MG PO CAPS: ORAL_CAPSULE | ORAL | 1 refills | Status: DC

## 2023-10-25 MED ORDER — ALTEPLASE 2 MG IJ SOLR
2.0000 mg | Freq: Once | INTRAMUSCULAR | Status: AC | PRN
  Administered 2023-10-25: 2 mg
  Filled 2023-10-25: qty 2

## 2023-10-25 MED ORDER — SODIUM CHLORIDE 0.9 % IV SOLN
Freq: Once | INTRAVENOUS | Status: AC
  Filled 2023-10-25: qty 250

## 2023-10-25 MED ORDER — POTASSIUM CHLORIDE CRYS ER 20 MEQ PO TBCR: 20.0000 meq | EXTENDED_RELEASE_TABLET | Freq: Two times a day (BID) | ORAL | 1 refills | Status: DC

## 2023-10-25 MED ORDER — NYSTATIN 100000 UNIT/ML MT SUSP: 5.0000 mL | Freq: Four times a day (QID) | OROMUCOSAL | 0 refills | Status: AC

## 2023-10-25 MED ORDER — MAGIC MOUTHWASH W/LIDOCAINE: 5.0000 mL | Freq: Four times a day (QID) | ORAL | 2 refills | Status: AC | PRN

## 2023-10-25 MED ORDER — STERILE WATER FOR INJECTION IJ SOLN
5.0000 mL | Freq: Four times a day (QID) | OROMUCOSAL | 3 refills | Status: AC | PRN
  Filled 2023-10-25: qty 480, 12d supply, fill #0

## 2023-10-25 MED ORDER — DIPHENOXYLATE-ATROPINE 2.5-0.025 MG PO TABS: 1.0000 | ORAL_TABLET | Freq: Four times a day (QID) | ORAL | 0 refills | Status: AC | PRN

## 2023-10-25 NOTE — Progress Notes (Unsigned)
 Symptom Management Clinic  [facility] at [facility] A Department of the [facility]. [facility] [address] [place], [place] [address] [phone] (phone) [phone] (fax)  Patient Care Team: [place], [place] [facility] as PCP - General Georgina Shasta POUR, RN as Oncology Nurse Navigator [name], MD as Consulting Physician (Oncology) Lenn Aran, MD as Radiation Oncologist (Radiation Oncology)   Name of the patient: Vanessa Fisher  [id number]  01-Oct-1960   Date of visit: 10/25/23  Diagnosis- stage IV breast cancer  Chief complaint/ Reason for visit- diarrhea  Heme/Onc history:  Oncology History  Breast cancer (HCC)  07/31/2021 Imaging   Bilateral diagnostic mammogram and US  showed 5 centimeter LEFT breast mass associated with pleomorphic calcifications is suspicious for invasive ductal carcinoma.At least 4 LEFT axillary lymph nodes with abnormal morphology   08/21/2021 Cancer Staging   Staging form: Breast, AJCC 8th Edition - Clinical: Stage IIIB (cT4b, cN3c, cM0, G3, ER-, PR-, HER2+) - Signed by [name], MD on 08/28/2021 Histologic grading system: 3 grade system  -08/21/21 left breast ultrasound-guided biopsy showed invasive mammary carcinoma, grade 3, ER/PR negative, HER2 positive.  Left axillary lymph node biopsy positive for macro metastatic mammary carcinoma, 8 mm in greatest extent.   08/30/2021 Imaging   MRI brain w wo contrast -No metastatic disease or acute intracranial abnormality. Essentially normal for age MRI appearance of the brain.    09/03/2021 Echocardiogram   1. Left ventricular ejection fraction, by estimation, is 55 to 60%. Left ventricular ejection fraction by 3D volume is 58 %. The left ventricle has normal function. The left ventricle has no regional wall motion abnormalities. There is mild left  ventricular hypertrophy. Left ventricular diastolic parameters were normal.  2. Right ventricular systolic function  is normal. The right ventricular size is normal.  3. The mitral valve is normal in structure. No evidence of mitral valve regurgitation.  4. The aortic valve was not well visualized. Aortic valve regurgitation is not visualized   09/10/2021 Imaging   PET scan showed Large hypermetabolic left breast mass and diffuse skin thickening,consistent with primary breast carcinoma. Hypermetabolic lymphadenopathy in the left axilla, left subpectoral region, and left supraclavicular region, consistent with metastatic disease. No evidence of metastatic disease within the abdomen or pelvis   09/12/2021 - 02/13/2022 Chemotherapy   Patient is on Treatment Plan : BREAST  Docetaxel  + Carboplatin  + Trastuzumab  + Pertuzumab   (TCHP) q21d       Genetic Testing   Negative genetic testing. No pathogenic variants identified on the [facility] Common Hereditary Cancers+RNA panel. The report date is 10/26/2021.  The Common Hereditary Cancers Panel + RNA offered by [facility] includes sequencing and/or deletion duplication testing of the following 47 genes: APC, ATM, AXIN2, BARD1, BMPR1A, BRCA1, BRCA2, BRIP1, CDH1, CDKN2A (p14ARF), CDKN2A (p16INK4a), CKD4, CHEK2, CTNNA1, DICER1, EPCAM (Deletion/duplication testing only), GREM1 (promoter region deletion/duplication testing only), KIT, MEN1, MLH1, MSH2, MSH3, MSH6, MUTYH, NBN, NF1, NHTL1, PALB2, PDGFRA, PMS2, POLD1, POLE, PTEN, RAD50, RAD51C, RAD51D, SDHB, SDHC, SDHD, SMAD4, SMARCA4. STK11, TP53, TSC1, TSC2, and VHL.  The following genes were evaluated for sequence changes only: SDHA and HOXB13 c.251G>A variant only.   02/09/2022 Echocardiogram   LVEF 55 to 60%    03/27/2022 Surgery   S/p left mastectomy + SLNB ypT0 ypN0,    04/24/2022 - 12/23/2022 Chemotherapy   Patient is on Treatment Plan : BREAST Trastuzumab   + Pertuzumab  q21d x 13 cycles     05/06/2022 Echocardiogram   LVEF 55  to 60%    07/02/2022 - 08/03/2022 Radiation Therapy   Adjuvant breast radiation.    10/20/2022 Imaging    Bone scan whole body showed 1. Degenerative uptake at the sternoclavicular joints, shoulders,knees and hindfoot bilaterally. 2. No evidence for metastatic disease to the. 3. Uptake in the left breast may be related to postsurgical change or residual tumor.   07/28/2023 Mammogram   Unilateral left breast mammogram showed  1. Diffuse MEDIAL LEFT breast erythema and pain without interval change in mammographic appearance or suspicious sonographic abnormality. Stable skin thickening in this area is presumably due to prior radiation therapy. Findings are most suspicious for cellulitis. Recommend treatment with antibiotics. If antibiotics fail to resolve the erythema, surgical or dermatologic consultation for skin punch biopsy is recommended to exclude inflammatory breast cancer.   2. Redemonstrated LEFT breast seroma at the lumpectomy site, now with complex internal debris. Patient requests aspiration of the fluid component of this collection, which will be attempted under ultrasound guidance.   08/27/2023 Relapse/Recurrence   08/27/2023, left breast 11:00 punch biopsy showed 1. Breast, biopsy, left 11:00 punch BX :       - INVASIVE GRADE 3 MAMMARY CARCINOMA  INVADING SKIN AND EPIDERMIS WITH       ULCERATION  ER 70% positive, PR 0%, HER2 positive(3+).    09/08/2023 right axilla lymph node biopsy showed 1. Lymph node, needle/core biopsy, right axilla, hydromark coil clip :       ONE LYMPH NODE, POSITIVE FOR METASTATIC CARCINOMA MORPHOLOGICALLY COMPATIBLE       WITH BREAST PRIMARY (1/1).       METASTATIC FOCUS: 11 MM / 1.1 CM.       EXTRANODAL EXTENSION IDENTIFIE    09/01/2023 Imaging   MRI breast bilaterally without contrast showed  1. Widespread abnormal enhancement involving the skin and nipple of the LEFT breast, involving all 4 quadrants and compatible with known, biopsy proven LEFT breast cancer invading the skin. There is likely slight RETROAREOLAR extension of the nipple  involvement extending 8 mm posteriorly from the nipple.   2. Post treatment changes of the LEFT breast without other evidence of recurrent malignancy in the LEFT breast. Thin peripheral enhancement at the lumpectomy site is nonspecific but favored to be due to recent instrumentation.   3. Markedly enlarged RIGHT axillary level 1 lymph nodes, for which second-look ultrasound and ultrasound-guided biopsy is recommended. Small but asymmetric RIGHT axillary level 2 lymph nodes are also suspicious for malignancy but not amenable to percutaneous sampling.   4. Indeterminate RIGHT breast focus at the 9 o'clock periareolar position anterior depth measuring 3 mm and indeterminate RIGHT breast mass at the 9 o'clock central position at middle depth measuring 5 mm. If management would be impacted, MRI guided biopsies of the focus and mass at 9 o'clock in the RIGHT breast is recommended.   5. Confluent enhancement along the LEFT internal mammary vessels which could reflect ectatic vascular structures or an enlarged LEFT internal mammary lymph node. Recommend attention on forthcoming PET-CT.     09/10/2023 - 09/10/2023 Chemotherapy   Patient is on Treatment Plan : BREAST Fam-Trastuzumab Deruxtecan-nxki  (Enhertu ) (5.4) q21d     09/10/2023 Imaging   PET scan showed  1. Significant hypermetabolism involving skin thickening of the left breast laterally worrisome for recurrent breast cancer. 2. Extensive hypermetabolic right axillary and subpectoral lymphadenopathy consistent with metastatic nodal disease. 3. Hypermetabolic left internal mammary lymphadenopathy. 4. No findings for abdominal/pelvic metastatic disease or osseous metastatic disease. 5. Large fluid  collection in the left breast containing gas suggesting a postoperative fluid collection or abscess. 6. Benign hypermetabolic/brown fat in the neck and chest area.   09/27/2023 -  Chemotherapy   Patient is on Treatment Plan : BREAST  Fam-Trastuzumab Deruxtecan-nxki  (Enhertu ) (5.4) q21d       Interval history- ADEANA GRILLIOT is a 63 y.o. female with history of metastatic breast cancer, currently on enhertu , status post cycle 2 on 10/18/23, who presents to symptom management clinic for complaints of diarrhea.  Diarrhea started on Wednesday, worsened and then improved.  She thought that symptoms had resolved but then they recurred.  She had another watery stool this morning.  She has not taken anything for her symptoms due to previously having constipation that required hospitalization.  No fevers or chills.  Endorses some lower abdominal cramping.  She also complains of oral tenderness.  She was not able to get Magic mouthwash due to insurance.  Review of systems- ROS   Current treatment- ***  Allergies  Allergen Reactions  . Bc Powder [Aspirin-Salicylamide-Caffeine] Other (See Comments)    Heart fluttering  . Doxycycline  Diarrhea  . Gabapentin  Itching  . Hydrocodone -Acetaminophen  Other (See Comments)    Tachycardia (finding)  . Latex Itching    Patient denies 08/25/2023  . Penicillin G Other (See Comments)    Localized superficial swelling of skin  . Penicillins Other (See Comments)    Has patient had a PCN reaction causing immediate rash, facial/tongue/throat swelling, SOB or lightheadedness with hypotension: Yes Has patient had a PCN reaction causing severe rash involving mucus membranes or skin necrosis: No Has patient had a PCN reaction that required hospitalization: No Has patient had a PCN reaction occurring within the last 10 years: No If all of the above answers are NO, then may proceed with Cephalosporin use.  . Shellfish Allergy Swelling    MOUTH MOUTH    Past Medical History:  Diagnosis Date  . Anemia   . Anxiety   . Arthritis   . Asthma    WELL CONTROLLED  . Bile acid malabsorption syndrome   . Bradycardia    HAD AN ISSUE WITH THIS IN 2016-NO PROBLEMS SINCE  . Breast cancer, left breast  (HCC) 08/2021  . Carpal tunnel syndrome on right   . Depression   . Diabetes mellitus without complication (HCC)   . Diverticulitis   . Gastric reflux   . GERD (gastroesophageal reflux disease)   . Hand pain 05/12/2016  . Headache    H/O MIGRAINES  . History of kidney stones    H/O  . Lumbar radiculitis   . Neck pain, chronic   . Osteoarthritis of both knees   . Panic attacks   . Personal history of chemotherapy   . Personal history of radiation therapy     Past Surgical History:  Procedure Laterality Date  . BREAST BIOPSY Left 08/21/2021   Axilla Bx, Hydromarker, neg  . BREAST BIOPSY Left 08/21/2021   US  Bx, Ribbon Clip, invasive mammary carcinoma  . BREAST BIOPSY Left 03/12/2022   US  LT RADIO FREQUENCY TAG LOC US  GUIDE 03/12/2022 [facility]  . BREAST BIOPSY Left 03/12/2022   US  LT RADIO FREQUENCY TAG EA ADD LESION LOC US  GUIDE 03/12/2022 [facility]  . BREAST LUMPECTOMY Left 03/27/2022  . CARPAL TUNNEL RELEASE Right 12/06/2017   Procedure: CARPAL TUNNEL RELEASE;  Surgeon: Cleotilde Barrio, MD;  Location: [facility];  Service: Orthopedics;  Laterality: Right;  . COLONOSCOPY WITH PROPOFOL  N/A 06/11/2021   Procedure: COLONOSCOPY  WITH PROPOFOL ;  Surgeon: Unk Corinn Skiff, MD;  Location: [facility];  Service: Gastroenterology;  Laterality: N/A;  . DORSAL COMPARTMENT RELEASE Right 12/06/2017   Procedure: RELEASE DORSAL COMPARTMENT (DEQUERVAIN);  Surgeon: Cleotilde Barrio, MD;  Location: [facility];  Service: Orthopedics;  Laterality: Right;  . ESOPHAGOGASTRODUODENOSCOPY (EGD) WITH PROPOFOL  N/A 06/11/2021   Procedure: ESOPHAGOGASTRODUODENOSCOPY (EGD) WITH PROPOFOL ;  Surgeon: Unk Corinn Skiff, MD;  Location: [facility];  Service: Gastroenterology;  Laterality: N/A;  . INCISION AND DRAINAGE, ABSCESS, BREAST Left 08/27/2023   Procedure: INCISION AND DRAINAGE, ABSCESS, BREAST;  Surgeon: Rodolph Romano, MD;  Location: [facility];  Service: General;  Laterality: Left;   . PART MASTECTOMY,RADIO FREQUENCY LOCALIZER,AXILLARY SENTINEL NODE BIOPSY Left 03/27/2022   Procedure: PART MASTECTOMY,RADIO FREQUENCY LOCALIZER,AXILLARY SENTINEL NODE BIOPSY;  Surgeon: Rodolph Romano, MD;  Location: [facility];  Service: General;  Laterality: Left;  . PARTIAL HYSTERECTOMY    . PORTACATH PLACEMENT N/A 09/24/2021   Procedure: INSERTION PORT-A-CATH;  Surgeon: Rodolph Romano, MD;  Location: [facility];  Service: General;  Laterality: N/A;  . TUBAL LIGATION      Social History   Socioeconomic History  . Marital status: Single    Spouse name: Not on file  . Number of children: Not on file  . Years of education: Not on file  . Highest education level: Not on file  Occupational History  . Not on file  Tobacco Use  . Smoking status: Former    Types: Cigarettes  . Smokeless tobacco: Never  Vaping Use  . Vaping status: Never Used  Substance and Sexual Activity  . Alcohol use: No  . Drug use: No  . Sexual activity: Not on file  Other Topics Concern  . Not on file  Social History Narrative   Lives alone   Social Drivers of Health   Financial Resource Strain: Low Risk  (10/20/2023)   Received from [facility]   Overall Financial Resource Strain (CARDIA)   . Difficulty of Paying Living Expenses: Not hard at all  Food Insecurity: Food Insecurity Present (10/20/2023)   Received from [facility]   Hunger Vital Sign   . Within the past 12 months, you worried that your food would run out before you got the money to buy more.: Never true   . Within the past 12 months, the food you bought just didn't last and you didn't have money to get more.: Sometimes true  Transportation Needs: No Transportation Needs (10/20/2023)   Received from [facility]   [facility] - Transportation   . In the past 12 months, has lack of transportation kept you from medical appointments or from getting medications?: No   . Lack of  Transportation (Non-Medical): No  Recent Concern: Transportation Needs - Unmet Transportation Needs (10/08/2023)   Received from [facility]   [facility] - Transportation   . In the past 12 months, has lack of transportation kept you from medical appointments or from getting medications?: Yes   . Lack of Transportation (Non-Medical): Yes  Physical Activity: Inactive (08/15/2021)   Exercise Vital Sign   . Days of Exercise per Week: 0 days   . Minutes of Exercise per Session: 0 min  Stress: Stress Concern Present (08/15/2021)   [employer] of Occupational [employer] - Occupational Stress Questionnaire   . Feeling of Stress : Rather much  Social Connections: Socially Isolated (08/15/2021)   Social Connection and Isolation Panel   . Frequency of Communication with Friends and  Family: Once a week   . Frequency of Social Gatherings with Friends and Family: Once a week   . Attends Religious Services: Never   . Active Member of Clubs or Organizations: No   . Attends Banker Meetings: Not on file   . Marital Status: Never married  Intimate Partner Violence: Not At Risk (08/15/2021)   Humiliation, Afraid, Rape, and Kick questionnaire   . Fear of Current or Ex-Partner: No   . Emotionally Abused: No   . Physically Abused: No   . Sexually Abused: No    Family History  Problem Relation Age of Onset  . Diabetes Mother   . Hypertension Mother   . Heart failure Mother   . Pancreatic cancer Mother 28  . Diabetes Father   . Hypertension Father   . Breast cancer Maternal Aunt   . Cervical cancer Maternal Aunt   . Lung cancer Son 25     Current Outpatient Medications:  .  albuterol  (PROVENTIL ) (2.5 MG/3ML) 0.083% nebulizer solution, Take 3 mLs (2.5 mg total) by nebulization every 4 (four) hours as needed for wheezing or shortness of breath., Disp: 75 mL, Rfl: 2 .  albuterol  (VENTOLIN  HFA) 108 (90 Base) MCG/ACT inhaler, Inhale 2 puffs into the lungs every 6 (six) hours  as needed for wheezing or shortness of breath., Disp: 18 g, Rfl: 0 .  allopurinol (ZYLOPRIM) 100 MG tablet, Take 100 mg by mouth daily., Disp: , Rfl:  .  atorvastatin (LIPITOR) 10 MG tablet, Take 10 mg by mouth daily., Disp: , Rfl:  .  Azelastine  HCl 137 MCG/SPRAY SOLN, Place 1 spray into the nose daily., Disp: 30 mL, Rfl: 0 .  calcium  carbonate (OS-CAL - DOSED IN MG OF ELEMENTAL CALCIUM ) 1250 (500 Ca) MG tablet, Take 1 tablet (1,250 mg total) by mouth daily., Disp: 90 tablet, Rfl: 1 .  celecoxib (CELEBREX) 200 MG capsule, Take by mouth 2 (two) times daily., Disp: , Rfl:  .  cetirizine  (ZYRTEC  ALLERGY) 10 MG tablet, Take 1 tablet (10 mg total) by mouth daily., Disp: 90 tablet, Rfl: 0 .  cyclobenzaprine  (FLEXERIL ) 10 MG tablet, Take 10 mg by mouth 3 (three) times daily., Disp: , Rfl:  .  diclofenac Sodium (VOLTAREN) 1 % GEL, Apply topically., Disp: , Rfl:  .  diphenoxylate -atropine  (LOMOTIL ) 2.5-0.025 MG tablet, Take 1 tablet by mouth 4 (four) times daily as needed for diarrhea or loose stools., Disp: 120 tablet, Rfl: 0 .  DULoxetine (CYMBALTA) 20 MG capsule, Take 20 mg by mouth., Disp: , Rfl:  .  fluticasone  (FLONASE ) 50 MCG/ACT nasal spray, Place 2 sprays into both nostrils daily., Disp: 15.8 mL, Rfl: 0 .  KLOR-CON  M20 20 MEQ tablet, TAKE 1 TABLET BY MOUTH TWICE A DAY, Disp: 60 tablet, Rfl: 1 .  lidocaine -prilocaine  (EMLA ) cream, Apply 1 Application topically as needed. Apply small amount to port and cover with saran wrap 1-2 hours prior to port access, Disp: 30 g, Rfl: 12 .  losartan (COZAAR) 25 MG tablet, Take 25 mg by mouth daily., Disp: , Rfl:  .  magnesium  chloride (SLOW-MAG) 64 MG TBEC SR tablet, Take 1 tablet (64 mg total) by mouth 2 (two) times daily., Disp: 60 tablet, Rfl: 2 .  metoprolol succinate (TOPROL-XL) 25 MG 24 hr tablet, Take 25 mg by mouth daily., Disp: , Rfl:  .  ondansetron  (ZOFRAN ) 8 MG tablet, Take 1 tablet (8 mg total) by mouth every 8 (eight) hours as needed for nausea or  vomiting. Start  on the third day after chemotherapy., Disp: 30 tablet, Rfl: 1 .  oxyCODONE  HCl 10 MG TABA, Take 10 mg by mouth every 4 (four) hours as needed., Disp: 14 tablet, Rfl: 0 .  pantoprazole (PROTONIX) 20 MG tablet, Take 20 mg by mouth daily., Disp: , Rfl:  .  pregabalin (LYRICA) 50 MG capsule, Take 50 mg by mouth 3 (three) times daily., Disp: , Rfl:  .  prochlorperazine  (COMPAZINE ) 10 MG tablet, Take 1 tablet (10 mg total) by mouth every 6 (six) hours as needed for nausea or vomiting., Disp: 30 tablet, Rfl: 1 .  dexamethasone  (DECADRON ) 4 MG tablet, Take 2 tablets (8 mg) by mouth daily for 3 days starting the day after chemotherapy. Take with food. (Patient not taking: Reported on 10/25/2023), Disp: 30 tablet, Rfl: 1 .  docusate sodium  (COLACE) 100 MG capsule, Take 1 capsule (100 mg total) by mouth 2 (two) times daily. (Patient not taking: Reported on 10/25/2023), Disp: , Rfl:  .  escitalopram  (LEXAPRO ) 10 MG tablet, Take 1 tablet (10 mg total) by mouth daily. (Patient not taking: Reported on 10/25/2023), Disp: 30 tablet, Rfl: 1 .  fluconazole  (DIFLUCAN ) 150 MG tablet, Take 1 tablet (150 mg total) by mouth once a week. (Patient not taking: Reported on 10/25/2023), Disp: 2 tablet, Rfl: 0 .  naloxone (NARCAN) nasal spray 4 mg/0.1 mL, , Disp: , Rfl:  No current facility-administered medications for this visit.  Facility-Administered Medications Ordered in Other Visits:  .  magnesium  sulfate IVPB 2 g 50 mL, 2 g, Intravenous, Once, Tonette Lauraine [place], [place]  Physical exam:  Vitals:   10/25/23 1124  BP: 120/63  Pulse: 63  Resp: 18  Temp: 98.1 F (36.7 C)  TempSrc: Tympanic  Weight: 234 lb (106.1 kg)  Height: 5' 6 (1.676 m)   Physical Exam      Latest Ref Rng & Units 10/25/2023   10:47 AM  CMP  Glucose 70 - 99 mg/dL 870   BUN 8 - 23 mg/dL 36   Creatinine 9.55 - 1.00 mg/dL 8.95   Sodium 864 - 854 mmol/L 130   Potassium 3.5 - 5.1 mmol/L 3.8   Chloride 98 - 111 mmol/L 100   CO2 22 -  32 mmol/L 21   Calcium  8.9 - 10.3 mg/dL 8.3   Total Protein 6.5 - 8.1 g/dL 6.1   Total Bilirubin 0.0 - 1.2 mg/dL 0.6   Alkaline Phos 38 - 126 U/L 84   AST 15 - 41 U/L 23   ALT 0 - 44 U/L 23       Latest Ref Rng & Units 10/25/2023   10:47 AM  CBC  WBC 4.0 - 10.5 K/uL 8.3   Hemoglobin 12.0 - 15.0 g/dL 88.0   Hematocrit 63.9 - 46.0 % 36.8   Platelets 150 - 400 K/uL 230     No images are attached to the encounter.  CT ABDOMEN PELVIS W CONTRAST Result Date: 10/11/2023 CLINICAL DATA:  Vomiting and diarrhea. EXAM: CT ABDOMEN AND PELVIS WITH CONTRAST TECHNIQUE: Multidetector CT imaging of the abdomen and pelvis was performed using the standard protocol following bolus administration of intravenous contrast. RADIATION DOSE REDUCTION: This exam was performed according to the departmental dose-optimization program which includes automated exposure control, adjustment of the mA and/or kV according to patient size and/or use of iterative reconstruction technique. CONTRAST:  OMNIPAQUE  IOHEXOL  300 MG/ML  SOLN COMPARISON:  April 17, 2021 FINDINGS: Lower chest: No acute abnormality. Hepatobiliary: No focal liver abnormality is  seen. No gallstones, gallbladder wall thickening, or biliary dilatation. Pancreas: Unremarkable. No pancreatic ductal dilatation or surrounding inflammatory changes. Spleen: Normal in size without focal abnormality. Adrenals/Urinary Tract: Adrenal glands are unremarkable. Kidneys are normal, without renal calculi, focal lesion, or hydronephrosis. Bladder is unremarkable. Stomach/Bowel: There is a small hiatal hernia. Appendix appears normal. No evidence of bowel wall thickening, distention, or inflammatory changes. Vascular/Lymphatic: No significant vascular findings are present. No enlarged abdominal or pelvic lymph nodes. Reproductive: Status post hysterectomy. No adnexal masses. Other: No abdominal wall hernia or abnormality. No abdominopelvic ascites. Musculoskeletal: Moderate to  marked severity degenerative changes are seen at the level of L5-S1. IMPRESSION: 1. Small hiatal hernia. 2. 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 Electronically Signed   By: Suzen Dials M.D.   On: 10/11/2023 12:53   DG Abdomen 1 View Result Date: 10/04/2023 CLINICAL DATA:  Constipation. EXAM: ABDOMEN - 1 VIEW COMPARISON:  05/20/2021, 04/17/2021. FINDINGS: The bowel gas pattern is normal. A mild amount of retained stool is present in the colon. No radio-opaque calculi or other acute radiographic abnormality are seen. Degenerative changes are noted in the lumbar spine IMPRESSION: 1. No evidence of bowel obstruction. 2. Mild amount of retained stool in the colon. Electronically Signed   By: Leita Birmingham M.D.   On: 10/04/2023 17:26    Assessment and plan- Patient is a 63 y.o. female who presents to symptom management clinic for  Stage IV HER2 positive breast cancer- s/p cycle 2 of Enhertu .  Diarrhea- likely secondary to enhertu .    Visit Diagnosis 1. Hypokalemia     Patient expressed understanding and was in agreement with this plan. She also understands that She can call clinic at any time with any questions, concerns, or complaints.   Thank you for allowing me to participate in the care of this very pleasant patient.   Tinnie Dawn, DNP, AGNP-C, AOCNP [facility] [phone]  CC:

## 2023-10-25 NOTE — Patient Instructions (Signed)
 Diarrhea, Adult Diarrhea is frequent loose and sometimes watery bowel movements. Diarrhea can make you feel weak and cause you to become dehydrated. Dehydration is a condition in which there is not enough water or other fluids in the body. Dehydration can make you tired and thirsty, cause you to have a dry mouth, and decrease how often you urinate. Diarrhea typically lasts 2-3 days. However, it can last longer if it is a sign of something more serious. It is important to treat your diarrhea as told by your health care provider. Follow these instructions at home: Eating and drinking     Follow these recommendations as told by your health care provider: Take an oral rehydration solution (ORS). This is an over-the-counter medicine that helps return your body to its normal balance of nutrients and water. It is found at pharmacies and retail stores. Drink enough fluid to keep your urine pale yellow. Drink fluids such as water, diluted fruit juice, and low-calorie sports drinks. You can drink milk also, if desired. Sucking on ice chips is another way to get fluids. Avoid drinking fluids that contain a lot of sugar or caffeine, such as soda, energy drinks, and regular sports drinks. Avoid alcohol. Eat bland, easy-to-digest foods in small amounts as you are able. These foods include bananas, applesauce, rice, lean meats, toast, and crackers. Avoid spicy or fatty foods.  Medicines Take over-the-counter and prescription medicines only as told by your health care provider. If you were prescribed antibiotics, take them as told by your health care provider. Do not stop using the antibiotic even if you start to feel better. General instructions  Wash your hands often using soap and water for at least 20 seconds. If soap and water are not available, use hand sanitizer. Others in the household should wash their hands as well. Hands should be washed: After using the toilet or changing a diaper. Before  preparing, cooking, or serving food. While caring for a sick person or while visiting someone in a hospital. Rest at home while you recover. Take a warm bath to relieve any burning or pain from frequent diarrhea episodes. Watch your condition for any changes. Contact a health care provider if: You have a fever. Your diarrhea gets worse. You have new symptoms. You vomit every time you eat or drink. You feel light-headed, dizzy, or have a headache. You have muscle cramps. You have signs of dehydration, such as: Dark urine, very little urine, or no urine. Cracked lips. Dry mouth. Sunken eyes. Sleepiness. Weakness. You have bloody or black stools or stools that look like tar. You have severe pain, cramping, or bloating in your abdomen. Your skin feels cold and clammy. You feel confused. Get help right away if: You have chest pain or your heart is beating very quickly. You have trouble breathing or you are breathing very quickly. You feel extremely weak or you faint. These symptoms may be an emergency. Get help right away. Call 911. Do not wait to see if the symptoms will go away. Do not drive yourself to the hospital. This information is not intended to replace advice given to you by your health care provider. Make sure you discuss any questions you have with your health care provider. Document Revised: 07/08/2021 Document Reviewed: 07/08/2021 Elsevier Patient Education  2024 [employer].

## 2023-10-25 NOTE — Telephone Encounter (Signed)
 The patient called and said she needs to come in and get seen by somebody she has had diarrhea all weekend and she started back with the mouth sores again.  Says that she needs to be in [facility] and then I spoke with Powell and she says she will need CBC with differential Maxy and a mag.  I got it set up for 10:30 the labs and then see [facility] and if needed IV fluids. I CALLED HER AND SHE WILL BE HERE 10:30.

## 2023-10-26 ENCOUNTER — Telehealth: Payer: Self-pay | Admitting: *Deleted

## 2023-10-26 ENCOUNTER — Encounter: Payer: Self-pay | Admitting: Oncology

## 2023-10-26 ENCOUNTER — Other Ambulatory Visit: Payer: Self-pay

## 2023-10-26 NOTE — Telephone Encounter (Signed)
 Is trying to get information to help with her getting some Magic mouthwash for her is in her mouth. She says t that she has talked to a person named Geni and when it comes to meds they are supposed to be helping patients.  Told her that she had a charitable foundation and it has all ran out.  And it is only a one-time of $1000 .  I told her that I can asked the Ms duffy to see if she has any help because the Magic mouthwash I was told that the insurance does not cover for this patient..  Told her that they sent nystatin  down to help and according to [facility] who said that she can go get it at her regular pharmacy and that that 1 was already put in.  She says okay.I looked back in the notes from [place] she has already talked to [facility] and she has used the 1000 and it is 1 time with that foundation.

## 2023-10-26 NOTE — Telephone Encounter (Signed)
 Left vm for patient to return my phone call re: her prescription meds.

## 2023-10-26 NOTE — Telephone Encounter (Signed)
 Patient called in saying that the medicine for her mouth was  was going to be free, when she got there it was not and it needed to be approved from her insurance.   Lauren NP talked to her yesterday and saw her but in [facility] the patient asked for Josh to call her.  Seems like she needs to have authorization for the medicines.  There is 2 prescriptions for her mouth and I am not sure which one she needs to have or does she need both of them because more and put in to mouth sores.

## 2023-10-27 ENCOUNTER — Inpatient Hospital Stay

## 2023-10-27 NOTE — Progress Notes (Signed)
 Nutrition  Unable to reach patient for telephone visit today.  Tried to reach patient at appointment time and at 3pm but no answer.  Left message with RD contact information. Message sent to scheduling to offer another appointment.  Izora Benn B. Dasie SOLON, CSO, LDN Registered Dietitian [phone]

## 2023-10-28 ENCOUNTER — Encounter: Payer: Self-pay | Admitting: Radiation Oncology

## 2023-10-28 ENCOUNTER — Other Ambulatory Visit: Payer: Self-pay

## 2023-10-28 ENCOUNTER — Encounter: Payer: Self-pay | Admitting: Oncology

## 2023-10-28 ENCOUNTER — Ambulatory Visit
Admission: RE | Admit: 2023-10-28 | Discharge: 2023-10-28 | Disposition: A | Discharge status: Home / Self Care | Source: Ambulatory Visit | Attending: Radiation Oncology | Admitting: Radiation Oncology

## 2023-10-28 VITALS — BP 103/69 | HR 70 | Temp 97.9°F | Resp 16 | Wt 232.0 lb

## 2023-10-28 DIAGNOSIS — Z923 Personal history of irradiation: Secondary | ICD-10-CM | POA: Diagnosis not present

## 2023-10-28 DIAGNOSIS — R590 Localized enlarged lymph nodes: Secondary | ICD-10-CM | POA: Insufficient documentation

## 2023-10-28 DIAGNOSIS — Z171 Estrogen receptor negative status [ER-]: Secondary | ICD-10-CM | POA: Insufficient documentation

## 2023-10-28 DIAGNOSIS — C50012 Malignant neoplasm of nipple and areola, left female breast: Secondary | ICD-10-CM | POA: Insufficient documentation

## 2023-10-28 NOTE — Progress Notes (Signed)
 Radiation Oncology Follow up Note  Name: Vanessa Fisher   Date:   10/28/2023 MRN:  [id number] DOB: 02-04-1960    This 63 y.o. 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  REFERRING PROVIDER: [place], [facility]  HPI: Patient is a 63 year old female now out a year having completed left breast and peripheral lymphatic radiation therapy for stage IIIb HER2/neu positive invasive mammary carcinoma.SABRA  Unfortunately patient has gone on to develop stage IV disease now with invasion of the left breast skin which is positive on PET CT scan and biopsy positive back in July 25 now with ER/PR positive and HER2/neu positive breast cancer.  She also had right axillary lymph node biopsy again showing ER/PR negative HER2/neu 3+ breast cancer.  PET scan shows recurrence in bilateral breast and lymphadenopathy MRI of her brain is negative.  She is currently onFam-trastuzumab  deruxtecan- Enhertu , which she is tolerating well.  There is a small ulceration around the nipple areolar complex on the left otherwise no evidence of overt disease at this time.  She is having no pain at this time.  PET scan again shows extensive hypermetabolic right axillary and subpectoral lymphadenopathy consistent with metastatic nodal disease as well as left internal mammary lymphadenopathy.  COMPLICATIONS OF TREATMENT: none  FOLLOW UP COMPLIANCE: keeps appointments   PHYSICAL EXAM:  BP 103/69   Pulse 70   Temp 97.9 F (36.6 C) (Tympanic)   Resp 16   Wt 232 lb (105.2 kg)   BMI 37.45 kg/m  Left breast is somewhat thickened.  Small area of ulceration around the nipple areolar complex.  No dominant masses noted in either breast.  Well-developed well-nourished patient in NAD. HEENT  reveals PERLA, EOMI, discs not visualized.  Oral cavity is clear. No oral mucosal lesions are identified. Neck is clear without evidence of cervical or supraclavicular adenopathy. Lungs are clear to A&P. Cardiac examination is essentially unremarkable with regular rate and rhythm without murmur rub or thrill. Abdomen is benign with no organomegaly or masses noted. Motor sensory and DTR levels are equal and symmetric in the upper and lower extremities. Cranial nerves II through XII are grossly intact. Proprioception is intact. No peripheral adenopathy or edema is identified. No motor or sensory levels are noted. Crude visual fields are within normal range.  RADIOLOGY RESULTS: CT scans and PET CT scans as well as MRI of the brain reviewed compatible with above-stated findings  PLAN: At this time patient will continue treatment through medical oncology for her stage IV disease.  I do not see any areas that need palliative radiation therapy at this time.  I am open to reevaluate her anytime should that be indicated.  I have asked to see her back in 6 months for follow-up.  Patient knows to call with any concerns.  I would like to take this opportunity to thank you for allowing me to participate in the care of your patient.SABRA Marcey Penton, MD

## 2023-11-05 MED FILL — Fosaprepitant Dimeglumine For IV Infusion 150 MG (Base Eq): INTRAVENOUS | Qty: 5 | Status: AC

## 2023-11-08 ENCOUNTER — Other Ambulatory Visit

## 2023-11-08 ENCOUNTER — Ambulatory Visit

## 2023-11-08 ENCOUNTER — Ambulatory Visit: Admitting: Oncology

## 2023-11-08 ENCOUNTER — Inpatient Hospital Stay: Attending: Oncology | Admitting: Oncology

## 2023-11-08 ENCOUNTER — Inpatient Hospital Stay: Attending: Oncology

## 2023-11-08 ENCOUNTER — Encounter: Payer: Self-pay | Admitting: Oncology

## 2023-11-08 ENCOUNTER — Inpatient Hospital Stay

## 2023-11-08 VITALS — BP 140/78 | HR 70 | Temp 96.0°F | Resp 19

## 2023-11-08 VITALS — BP 112/55 | HR 88 | Temp 97.2°F | Resp 18 | Ht 66.0 in | Wt 230.0 lb

## 2023-11-08 DIAGNOSIS — M7989 Other specified soft tissue disorders: Secondary | ICD-10-CM | POA: Diagnosis not present

## 2023-11-08 DIAGNOSIS — K521 Toxic gastroenteritis and colitis: Secondary | ICD-10-CM | POA: Diagnosis not present

## 2023-11-08 DIAGNOSIS — C50812 Malignant neoplasm of overlapping sites of left female breast: Secondary | ICD-10-CM | POA: Diagnosis not present

## 2023-11-08 DIAGNOSIS — Z23 Encounter for immunization: Secondary | ICD-10-CM | POA: Insufficient documentation

## 2023-11-08 DIAGNOSIS — E876 Hypokalemia: Secondary | ICD-10-CM | POA: Insufficient documentation

## 2023-11-08 DIAGNOSIS — Z5112 Encounter for antineoplastic immunotherapy: Secondary | ICD-10-CM | POA: Insufficient documentation

## 2023-11-08 DIAGNOSIS — Z515 Encounter for palliative care: Secondary | ICD-10-CM | POA: Insufficient documentation

## 2023-11-08 DIAGNOSIS — D649 Anemia, unspecified: Secondary | ICD-10-CM | POA: Diagnosis not present

## 2023-11-08 DIAGNOSIS — E860 Dehydration: Secondary | ICD-10-CM | POA: Diagnosis not present

## 2023-11-08 DIAGNOSIS — Z171 Estrogen receptor negative status [ER-]: Secondary | ICD-10-CM

## 2023-11-08 DIAGNOSIS — Z5111 Encounter for antineoplastic chemotherapy: Secondary | ICD-10-CM

## 2023-11-08 DIAGNOSIS — C50212 Malignant neoplasm of upper-inner quadrant of left female breast: Secondary | ICD-10-CM | POA: Diagnosis present

## 2023-11-08 DIAGNOSIS — Z170 Estrogen receptor positive status [ER+]: Secondary | ICD-10-CM | POA: Insufficient documentation

## 2023-11-08 DIAGNOSIS — C50912 Malignant neoplasm of unspecified site of left female breast: Secondary | ICD-10-CM

## 2023-11-08 DIAGNOSIS — T451X5A Adverse effect of antineoplastic and immunosuppressive drugs, initial encounter: Secondary | ICD-10-CM

## 2023-11-08 LAB — CMP (CANCER CENTER ONLY)
ALT: 33 U/L (ref 0–44)
AST: 21 U/L (ref 15–41)
Albumin: 3 g/dL — ABNORMAL LOW (ref 3.5–5.0)
Alkaline Phosphatase: 100 U/L (ref 38–126)
Anion gap: 6 (ref 5–15)
BUN: 25 mg/dL — ABNORMAL HIGH (ref 8–23)
CO2: 25 mmol/L (ref 22–32)
Calcium: 8.6 mg/dL — ABNORMAL LOW (ref 8.9–10.3)
Chloride: 105 mmol/L (ref 98–111)
Creatinine: 0.75 mg/dL (ref 0.44–1.00)
GFR, Estimated: >60 mL/min (ref >60)
Glucose, Bld: 111 mg/dL — ABNORMAL HIGH (ref 70–99)
Potassium: 3.6 mmol/L (ref 3.5–5.1)
Sodium: 136 mmol/L (ref 135–145)
Total Bilirubin: 0.3 mg/dL (ref 0.0–1.2)
Total Protein: 5.8 g/dL — ABNORMAL LOW (ref 6.5–8.1)

## 2023-11-08 LAB — CBC WITH DIFFERENTIAL (CANCER CENTER ONLY)
Abs Immature Granulocytes: 0.35 K/uL — ABNORMAL HIGH (ref 0.00–0.07)
Basophils Absolute: 0 K/uL (ref 0.0–0.1)
Basophils Relative: 0 %
Eosinophils Absolute: 0 K/uL (ref 0.0–0.5)
Eosinophils Relative: 0 %
HCT: 29.7 % — ABNORMAL LOW (ref 36.0–46.0)
Hemoglobin: 9.6 g/dL — ABNORMAL LOW (ref 12.0–15.0)
Immature Granulocytes: 4 %
Lymphocytes Relative: 21 %
Lymphs Abs: 1.9 K/uL (ref 0.7–4.0)
MCH: 26.1 pg (ref 26.0–34.0)
MCHC: 32.3 g/dL (ref 30.0–36.0)
MCV: 80.7 fL (ref 80.0–100.0)
Monocytes Absolute: 0.7 K/uL (ref 0.1–1.0)
Monocytes Relative: 7 %
Neutro Abs: 6.3 K/uL (ref 1.7–7.7)
Neutrophils Relative %: 68 %
Platelet Count: 230 K/uL (ref 150–400)
RBC: 3.68 MIL/uL — ABNORMAL LOW (ref 3.87–5.11)
RDW: 17.2 % — ABNORMAL HIGH (ref 11.5–15.5)
WBC Count: 9.3 K/uL (ref 4.0–10.5)
nRBC: 0.2 % (ref 0.0–0.2)

## 2023-11-08 MED ORDER — FAM-TRASTUZUMAB DERUXTECAN-NXKI CHEMO 100 MG IV SOLR
5.4000 mg/kg | Freq: Once | INTRAVENOUS | Status: AC
  Administered 2023-11-08: 600 mg via INTRAVENOUS
  Filled 2023-11-08: qty 30

## 2023-11-08 MED ORDER — PALONOSETRON HCL INJECTION 0.25 MG/5ML
0.2500 mg | Freq: Once | INTRAVENOUS | Status: AC
  Administered 2023-11-08: 0.25 mg via INTRAVENOUS
  Filled 2023-11-08: qty 5

## 2023-11-08 MED ORDER — SODIUM CHLORIDE 0.9 % IV SOLN
150.0000 mg | Freq: Once | INTRAVENOUS | Status: AC
  Administered 2023-11-08: 150 mg via INTRAVENOUS
  Filled 2023-11-08: qty 150

## 2023-11-08 MED ORDER — INFLUENZA VIRUS VACC SPLIT PF (FLUZONE) 0.5 ML IM SUSY
0.5000 mL | PREFILLED_SYRINGE | Freq: Once | INTRAMUSCULAR | Status: AC
  Administered 2023-11-08: 0.5 mL via INTRAMUSCULAR
  Filled 2023-11-08: qty 0.5

## 2023-11-08 MED ORDER — DEXAMETHASONE SODIUM PHOSPHATE 10 MG/ML IJ SOLN
10.0000 mg | Freq: Once | INTRAMUSCULAR | Status: AC
  Administered 2023-11-08: 10 mg via INTRAVENOUS
  Filled 2023-11-08: qty 1

## 2023-11-08 MED ORDER — DEXTROSE 5 % IV SOLN
INTRAVENOUS | Status: DC
  Filled 2023-11-08: qty 250

## 2023-11-08 MED ORDER — ACETAMINOPHEN 325 MG PO TABS
650.0000 mg | ORAL_TABLET | Freq: Once | ORAL | Status: AC
  Administered 2023-11-08: 650 mg via ORAL
  Filled 2023-11-08: qty 2

## 2023-11-08 MED ORDER — DIPHENHYDRAMINE HCL 25 MG PO CAPS
50.0000 mg | ORAL_CAPSULE | Freq: Once | ORAL | Status: AC
  Administered 2023-11-08: 50 mg via ORAL
  Filled 2023-11-08: qty 2

## 2023-11-08 NOTE — Assessment & Plan Note (Signed)
 Treatment plan as listed above.

## 2023-11-08 NOTE — Assessment & Plan Note (Addendum)
Hemoglobin is stable. Monitor counts.

## 2023-11-08 NOTE — Assessment & Plan Note (Signed)
 Continue Slow Mag 1 tab BID

## 2023-11-08 NOTE — Assessment & Plan Note (Addendum)
 Left breast cancer, cT4b N3 Mx, ER/PR-, HER2 + S/p neoadjuvant chemotherapy 6 cycles of TCHP  S/p left lumpectomy  + SLNB -ypT0 ypN0, complete pathological response.  S/p adjuvant Transtuzumab and Pertuzumab  11 cycles. S/p adjuvant radiation-  Declined adjuvant Zometa. Recurrent Stage IV breast cancer. 08/27/2023 left breast skin punch biopsy showed ER+ PR + HER2 (3+) breast carcinoma 09/08/2023, right axillary lymph node biopsy showed .  ER- PR-  HER2 (3+)  breast carcinoma  PET scan showed recurrence in bilateral breast and lymphadenopathy. MRI brain negative.   Recommend Fam-trastuzumab  deruxtecan- Enhertu ,  Labs are reviewed and discussed with patient.  She tolerates treatment with some moderate difficulties. Proceed with cycle 3 treatments.   LVEF has decreased comparing to prior.  She was seen by cardiology and was cleared to continue treatments.  She was started on beta-blocker and ACE inhibitors.

## 2023-11-08 NOTE — Assessment & Plan Note (Signed)
 Recommend left upper extremity ultrasound for further evaluation.

## 2023-11-08 NOTE — Assessment & Plan Note (Signed)
 Recommend patient to take calcium 1200mg  daily, otc supply.  Take vitamin D supplementation.

## 2023-11-08 NOTE — Progress Notes (Signed)
 Hematology/Oncology Progress note [phone] [phone]     CHIEF COMPLAINTS/REASON FOR VISIT:  Follow-up for Stage IV breast cancer, ER-,PR- HER2+ and ER-, PR-, HER2-  ASSESSMENT & PLAN:   Cancer Staging  Breast cancer (HCC) Staging form: Breast, AJCC 8th Edition - Clinical stage from 08/21/2021: Stage IIIB (cT4b, cN3c, cM0, G3, ER-, PR-, HER2+) - Signed by [name], MD on 09/12/2021  Breast cancer (HCC) Left breast cancer, cT4b N3 Mx, ER/PR-, HER2 + S/p neoadjuvant chemotherapy 6 cycles of TCHP  S/p left lumpectomy  + SLNB -ypT0 ypN0, complete pathological response.  S/p adjuvant Transtuzumab and Pertuzumab  11 cycles. S/p adjuvant radiation-  Declined adjuvant Zometa. Recurrent Stage IV breast cancer. 08/27/2023 left breast skin punch biopsy showed ER+ PR + HER2 (3+) breast carcinoma 09/08/2023, right axillary lymph node biopsy showed .  ER- PR-  HER2 (3+)  breast carcinoma  PET scan showed recurrence in bilateral breast and lymphadenopathy. MRI brain negative.   Recommend Fam-trastuzumab  deruxtecan- Enhertu ,  Labs are reviewed and discussed with patient.  She tolerates treatment with some moderate difficulties. Proceed with cycle 3 treatments.   LVEF has decreased comparing to prior.  She was seen by cardiology and was cleared to continue treatments.  She was started on beta-blocker and ACE inhibitors.  Encounter for antineoplastic chemotherapy Treatment plan as listed above  Hypocalcemia Recommend patient to take calcium  1200mg  daily, otc supply.  Take vitamin D  supplementation.   Hypomagnesemia Continue Slow Mag 1 tab BID   Normocytic anemia Hemoglobin is stable. Monitor counts.   Chemotherapy induced diarrhea Recommend patient to stop Imodium  and continue Lomotil  4 times daily.    Left upper extremity swelling Recommend left upper extremity ultrasound for further evaluation.      Orders Placed This Encounter  Procedures   US  Venous  Img Upper Uni Left    Standing Status:   Future    Expected Date:   11/11/2023    Expiration Date:   11/07/2024    Reason for Exam (SYMPTOM  OR DIAGNOSIS REQUIRED):   swelling    Preferred imaging location?:   [facility]   Cancer antigen 27.29    Standing Status:   Future    Expected Date:   12/20/2023    Expiration Date:   12/19/2024   Cancer antigen 15-3    Standing Status:   Future    Expected Date:   12/20/2023    Expiration Date:   12/19/2024   CBC with Differential (Cancer Center Only)    Standing Status:   Future    Expected Date:   12/20/2023    Expiration Date:   12/19/2024   CMP (Cancer Center only)    Standing Status:   Future    Expected Date:   12/20/2023    Expiration Date:   12/19/2024   Follow up 3 weeks  We spent sufficient time to discuss many aspect of care, questions were answered to patient's satisfaction.  Call Babara, MD, PhD [facility] Hematology Oncology 11/08/2023      HISTORY OF PRESENTING ILLNESS:  Vanessa Fisher is a  63 y.o.  female presents for follow up of Stage IV metastatic breast cancer.  SUMMARY OF ONCOLOGIC HISTORY: Oncology History  Breast cancer (HCC)  07/31/2021 Imaging   Bilateral diagnostic mammogram and US  showed 5 centimeter LEFT breast mass associated with pleomorphic calcifications is suspicious for invasive ductal carcinoma.At least 4 LEFT axillary lymph nodes with abnormal morphology   08/21/2021 Cancer Staging   Staging  form: Breast, AJCC 8th Edition - Clinical: Stage IIIB (cT4b, cN3c, cM0, G3, ER-, PR-, HER2+) - Signed by [name], MD on 08/28/2021 Histologic grading system: 3 grade system  -08/21/21 left breast ultrasound-guided biopsy showed invasive mammary carcinoma, grade 3, ER/PR negative, HER2 positive.  Left axillary lymph node biopsy positive for macro metastatic mammary carcinoma, 8 mm in greatest extent.   08/30/2021 Imaging   MRI brain w wo contrast -No metastatic disease or acute intracranial  abnormality. Essentially normal for age MRI appearance of the brain.    09/03/2021 Echocardiogram   1. Left ventricular ejection fraction, by estimation, is 55 to 60%. Left ventricular ejection fraction by 3D volume is 58 %. The left ventricle has normal function. The left ventricle has no regional wall motion abnormalities. There is mild left  ventricular hypertrophy. Left ventricular diastolic parameters were normal.  2. Right ventricular systolic function is normal. The right ventricular size is normal.  3. The mitral valve is normal in structure. No evidence of mitral valve regurgitation.  4. The aortic valve was not well visualized. Aortic valve regurgitation is not visualized   09/10/2021 Imaging   PET scan showed Large hypermetabolic left breast mass and diffuse skin thickening,consistent with primary breast carcinoma. Hypermetabolic lymphadenopathy in the left axilla, left subpectoral region, and left supraclavicular region, consistent with metastatic disease. No evidence of metastatic disease within the abdomen or pelvis   09/12/2021 - 02/13/2022 Chemotherapy   Patient is on Treatment Plan : BREAST  Docetaxel  + Carboplatin  + Trastuzumab  + Pertuzumab   (TCHP) q21d       Genetic Testing   Negative genetic testing. No pathogenic variants identified on the [facility] Common Hereditary Cancers+RNA panel. The report date is 10/26/2021.  The Common Hereditary Cancers Panel + RNA offered by [facility] includes sequencing and/or deletion duplication testing of the following 47 genes: APC, ATM, AXIN2, BARD1, BMPR1A, BRCA1, BRCA2, BRIP1, CDH1, CDKN2A (p14ARF), CDKN2A (p16INK4a), CKD4, CHEK2, CTNNA1, DICER1, EPCAM (Deletion/duplication testing only), GREM1 (promoter region deletion/duplication testing only), KIT, MEN1, MLH1, MSH2, MSH3, MSH6, MUTYH, NBN, NF1, NHTL1, PALB2, PDGFRA, PMS2, POLD1, POLE, PTEN, RAD50, RAD51C, RAD51D, SDHB, SDHC, SDHD, SMAD4, SMARCA4. STK11, TP53, TSC1, TSC2, and VHL.  The following  genes were evaluated for sequence changes only: SDHA and HOXB13 c.251G>A variant only.   02/09/2022 Echocardiogram   LVEF 55 to 60%    03/27/2022 Surgery   S/p left mastectomy + SLNB ypT0 ypN0,    04/24/2022 - 12/23/2022 Chemotherapy   Patient is on Treatment Plan : BREAST Trastuzumab   + Pertuzumab  q21d x 13 cycles     05/06/2022 Echocardiogram   LVEF 55 to 60%    07/02/2022 - 08/03/2022 Radiation Therapy   Adjuvant breast radiation.    10/20/2022 Imaging   Bone scan whole body showed 1. Degenerative uptake at the sternoclavicular joints, shoulders,knees and hindfoot bilaterally. 2. No evidence for metastatic disease to the. 3. Uptake in the left breast may be related to postsurgical change or residual tumor.   07/28/2023 Mammogram   Unilateral left breast mammogram showed  1. Diffuse MEDIAL LEFT breast erythema and pain without interval change in mammographic appearance or suspicious sonographic abnormality. Stable skin thickening in this area is presumably due to prior radiation therapy. Findings are most suspicious for cellulitis. Recommend treatment with antibiotics. If antibiotics fail to resolve the erythema, surgical or dermatologic consultation for skin punch biopsy is recommended to exclude inflammatory breast cancer.   2. Redemonstrated LEFT breast seroma at the lumpectomy site, now with complex internal  debris. Patient requests aspiration of the fluid component of this collection, which will be attempted under ultrasound guidance.   08/27/2023 Relapse/Recurrence   08/27/2023, left breast 11:00 punch biopsy showed 1. Breast, biopsy, left 11:00 punch BX :       - INVASIVE GRADE 3 MAMMARY CARCINOMA  INVADING SKIN AND EPIDERMIS WITH       ULCERATION  ER 70% positive, PR 0%, HER2 positive(3+).    09/08/2023 right axilla lymph node biopsy showed 1. Lymph node, needle/core biopsy, right axilla, hydromark coil clip :       ONE LYMPH NODE, POSITIVE FOR METASTATIC CARCINOMA  MORPHOLOGICALLY COMPATIBLE       WITH BREAST PRIMARY (1/1).       METASTATIC FOCUS: 11 MM / 1.1 CM.       EXTRANODAL EXTENSION IDENTIFIE    09/01/2023 Imaging   MRI breast bilaterally without contrast showed  1. Widespread abnormal enhancement involving the skin and nipple of the LEFT breast, involving all 4 quadrants and compatible with known, biopsy proven LEFT breast cancer invading the skin. There is likely slight RETROAREOLAR extension of the nipple involvement extending 8 mm posteriorly from the nipple.   2. Post treatment changes of the LEFT breast without other evidence of recurrent malignancy in the LEFT breast. Thin peripheral enhancement at the lumpectomy site is nonspecific but favored to be due to recent instrumentation.   3. Markedly enlarged RIGHT axillary level 1 lymph nodes, for which second-look ultrasound and ultrasound-guided biopsy is recommended. Small but asymmetric RIGHT axillary level 2 lymph nodes are also suspicious for malignancy but not amenable to percutaneous sampling.   4. Indeterminate RIGHT breast focus at the 9 o'clock periareolar position anterior depth measuring 3 mm and indeterminate RIGHT breast mass at the 9 o'clock central position at middle depth measuring 5 mm. If management would be impacted, MRI guided biopsies of the focus and mass at 9 o'clock in the RIGHT breast is recommended.   5. Confluent enhancement along the LEFT internal mammary vessels which could reflect ectatic vascular structures or an enlarged LEFT internal mammary lymph node. Recommend attention on forthcoming PET-CT.     09/10/2023 - 09/10/2023 Chemotherapy   Patient is on Treatment Plan : BREAST Fam-Trastuzumab Deruxtecan-nxki  (Enhertu ) (5.4) q21d     09/10/2023 Imaging   PET scan showed  1. Significant hypermetabolism involving skin thickening of the left breast laterally worrisome for recurrent breast cancer. 2. Extensive hypermetabolic right axillary and  subpectoral lymphadenopathy consistent with metastatic nodal disease. 3. Hypermetabolic left internal mammary lymphadenopathy. 4. No findings for abdominal/pelvic metastatic disease or osseous metastatic disease. 5. Large fluid collection in the left breast containing gas suggesting a postoperative fluid collection or abscess. 6. Benign hypermetabolic/brown fat in the neck and chest area.   09/27/2023 -  Chemotherapy   Patient is on Treatment Plan : BREAST Fam-Trastuzumab Deruxtecan-nxki  (Enhertu ) (5.4) q21d      + chronic nasal pressure, congestion, horse voice she attributes to sinusitis.   INTERVAL HISTORY Vanessa Fisher is a 63 y.o. female who has above history reviewed by me today presents for follow up visit for recurrent HER2 positive breast cancer  -  left breast persistent swelling with drainage in the site of previous biopsy, drainage has improved. Left breast pain has resolved.  She has been experiencing swelling in her left arm and hand for the past week. The swelling fluctuates in severity and is not associated with pain. She has a port on the right side.  She  has had significant diarrhea since last week, with episodes occurring three to four times daily. Despite using Imodium  every four hours, it has been ineffective. She maintains adequate hydration by consuming fluids, including watermelon and water . The diarrhea is accompanied by cramping, which has been severe but is slightly improving.  MEDICAL HISTORY:  Past Medical History:  Diagnosis Date   Anemia    Anxiety    Arthritis    Asthma    WELL CONTROLLED   Bile acid malabsorption syndrome    Bradycardia    HAD AN ISSUE WITH THIS IN 2016-NO PROBLEMS SINCE   Breast cancer, left breast (HCC) 08/2021   Carpal tunnel syndrome on right    Depression    Diabetes mellitus without complication (HCC)    Diverticulitis    Gastric reflux    GERD (gastroesophageal reflux disease)    Hand pain 05/12/2016   Headache     H/O MIGRAINES   History of kidney stones    H/O   Lumbar radiculitis    Neck pain, chronic    Osteoarthritis of both knees    Panic attacks    Personal history of chemotherapy    Personal history of radiation therapy     SURGICAL HISTORY: Past Surgical History:  Procedure Laterality Date   BREAST BIOPSY Left 08/21/2021   Axilla Bx, Hydromarker, neg   BREAST BIOPSY Left 08/21/2021   US  Bx, Ribbon Clip, invasive mammary carcinoma   BREAST BIOPSY Left 03/12/2022   US  LT RADIO FREQUENCY TAG LOC US  GUIDE 03/12/2022 [facility]   BREAST BIOPSY Left 03/12/2022   US  LT RADIO FREQUENCY TAG EA ADD LESION LOC US  GUIDE 03/12/2022 [facility]   BREAST LUMPECTOMY Left 03/27/2022   CARPAL TUNNEL RELEASE Right 12/06/2017   Procedure: CARPAL TUNNEL RELEASE;  Surgeon: Cleotilde Barrio, MD;  Location: [facility];  Service: Orthopedics;  Laterality: Right;   COLONOSCOPY WITH PROPOFOL  N/A 06/11/2021   Procedure: COLONOSCOPY WITH PROPOFOL ;  Surgeon: Unk Corinn Skiff, MD;  Location: [facility] END[facility]COPY;  Service: Gastroenterology;  Laterality: N/A;   DORSAL COMPARTMENT RELEASE Right 12/06/2017   Procedure: RELEASE DORSAL COMPARTMENT (DEQUERVAIN);  Surgeon: Cleotilde Barrio, MD;  Location: [facility];  Service: Orthopedics;  Laterality: Right;   ESOPHAGOGASTRODUODENOSCOPY (EGD) WITH PROPOFOL  N/A 06/11/2021   Procedure: ESOPHAGOGASTRODUODENOSCOPY (EGD) WITH PROPOFOL ;  Surgeon: Unk Corinn Skiff, MD;  Location: [facility] ENDOSCOPY;  Service: Gastroenterology;  Laterality: N/A;   INCISION AND DRAINAGE, ABSCESS, BREAST Left 08/27/2023   Procedure: INCISION AND DRAINAGE, ABSCESS, BREAST;  Surgeon: Rodolph Romano, MD;  Location: [facility];  Service: General;  Laterality: Left;   PART MASTECTOMY,RADIO FREQUENCY LOCALIZER,AXILLARY SENTINEL NODE BIOPSY Left 03/27/2022   Procedure: PART MASTECTOMY,RADIO FREQUENCY LOCALIZER,AXILLARY SENTINEL NODE BIOPSY;  Surgeon: Rodolph Romano, MD;  Location: [facility];   Service: General;  Laterality: Left;   PARTIAL HYSTERECTOMY     PORTACATH PLACEMENT N/A 09/24/2021   Procedure: INSERTION PORT-A-CATH;  Surgeon: Rodolph Romano, MD;  Location: [facility];  Service: General;  Laterality: N/A;   TUBAL LIGATION      SOCIAL HISTORY: Social History   Socioeconomic History   Marital status: Single    Spouse name: Not on file   Number of children: Not on file   Years of education: Not on file   Highest education level: Not on file  Occupational History   Not on file  Tobacco Use   Smoking status: Former    Types: Cigarettes   Smokeless tobacco: Never  Vaping Use   Vaping status: Never Used  Substance and Sexual Activity   Alcohol use: No   Drug use: No   Sexual activity: Not on file  Other Topics Concern   Not on file  Social History Narrative   Lives alone   Social Drivers of Health   Financial Resource Strain: Low Risk  (10/20/2023)   Received from [facility]   Overall Financial Resource Strain (CARDIA)    Difficulty of Paying Living Expenses: Not hard at all  Food Insecurity: Food Insecurity Present (10/20/2023)   Received from [facility]   Hunger Vital Sign    Within the past 12 months, you worried that your food would run out before you got the money to buy more.: Never true    Within the past 12 months, the food you bought just didn't last and you didn't have money to get more.: Sometimes true  Transportation Needs: No Transportation Needs (10/20/2023)   Received from [facility] - Transportation    In the past 12 months, has lack of transportation kept you from medical appointments or from getting medications?: No    Lack of Transportation (Non-Medical): No  Recent Concern: Transportation Needs - Unmet Transportation Needs (10/08/2023)   Received from [facility] - Transportation    In the past 12 months, has lack of transportation kept  you from medical appointments or from getting medications?: Yes    Lack of Transportation (Non-Medical): Yes  Physical Activity: Inactive (08/15/2021)   Exercise Vital Sign    Days of Exercise per Week: 0 days    Minutes of Exercise per Session: 0 min  Stress: Stress Concern Present (08/15/2021)   [employer] of Occupational Health - Occupational Stress Questionnaire    Feeling of Stress : Rather much  Social Connections: Socially Isolated (08/15/2021)   Social Connection and Isolation Panel    Frequency of Communication with Friends and Family: Once a week    Frequency of Social Gatherings with Friends and Family: Once a week    Attends Religious Services: Never    Database administrator or Organizations: No    Attends Engineer, structural: Not on file    Marital Status: Never married  Intimate Partner Violence: Not At Risk (08/15/2021)   Humiliation, Afraid, Rape, and Kick questionnaire    Fear of Current or Ex-Partner: No    Emotionally Abused: No    Physically Abused: No    Sexually Abused: No    FAMILY HISTORY: Family History  Problem Relation Age of Onset   Diabetes Mother    Hypertension Mother    Heart failure Mother    Pancreatic cancer Mother 105   Diabetes Father    Hypertension Father    Breast cancer Maternal Aunt    Cervical cancer Maternal Aunt    Lung cancer Son 9    ALLERGIES:  is allergic to bc powder [aspirin-salicylamide-caffeine], doxycycline , gabapentin , hydrocodone -acetaminophen , latex, penicillin g, penicillins, and shellfish allergy.  MEDICATIONS:  Current Outpatient Medications  Medication Sig Dispense Refill   albuterol  (PROVENTIL ) (2.5 MG/3ML) 0.083% nebulizer solution Take 3 mLs (2.5 mg total) by nebulization every 4 (four) hours as needed for wheezing or shortness of breath. 75 mL 2   albuterol  (VENTOLIN  HFA) 108 (90 Base) MCG/ACT inhaler Inhale 2 puffs into the lungs every 6 (six) hours as needed for wheezing or shortness of  breath. 18 g 0   allopurinol (ZYLOPRIM) 100 MG tablet Take 100 mg  by mouth daily.     atorvastatin (LIPITOR) 10 MG tablet Take 10 mg by mouth daily.     Azelastine  HCl 137 MCG/SPRAY SOLN Place 1 spray into the nose daily. 30 mL 0   calcium  carbonate (OS-CAL - DOSED IN MG OF ELEMENTAL CALCIUM ) 1250 (500 Ca) MG tablet Take 1 tablet (1,250 mg total) by mouth daily. 90 tablet 1   celecoxib (CELEBREX) 200 MG capsule Take by mouth 2 (two) times daily.     cetirizine  (ZYRTEC  ALLERGY) 10 MG tablet Take 1 tablet (10 mg total) by mouth daily. 90 tablet 0   cyclobenzaprine  (FLEXERIL ) 10 MG tablet Take 10 mg by mouth 3 (three) times daily.     dexamethasone  (DECADRON ) 4 MG tablet Take 2 tablets (8 mg) by mouth daily for 3 days starting the day after chemotherapy. Take with food. 30 tablet 1   diclofenac Sodium (VOLTAREN) 1 % GEL Apply topically.     diphenoxylate -atropine  (LOMOTIL ) 2.5-0.025 MG tablet Take 1 tablet by mouth 4 (four) times daily as needed for diarrhea or loose stools. 120 tablet 0   docusate sodium  (COLACE) 100 MG capsule Take 1 capsule (100 mg total) by mouth 2 (two) times daily.     DULoxetine (CYMBALTA) 20 MG capsule Take 20 mg by mouth.     escitalopram  (LEXAPRO ) 10 MG tablet Take 1 tablet (10 mg total) by mouth daily. 30 tablet 1   fluconazole  (DIFLUCAN ) 150 MG tablet Take 1 tablet (150 mg total) by mouth once a week. 2 tablet 0   fluticasone  (FLONASE ) 50 MCG/ACT nasal spray Place 2 sprays into both nostrils daily. 15.8 mL 0   lidocaine -prilocaine  (EMLA ) cream Apply 1 Application topically as needed. Apply small amount to port and cover with saran wrap 1-2 hours prior to port access 30 g 12   losartan (COZAAR) 25 MG tablet Take 25 mg by mouth daily.     magic mouthwash (multi-ingredient) oral suspension Swish and swallow 5-10 mLs 4 (four) times daily as needed. 480 mL 3   magic mouthwash w/lidocaine  SOLN Take 5 mLs by mouth 4 (four) times daily as needed for mouth pain. 250 mL 2    magnesium  chloride (SLOW-MAG) 64 MG TBEC SR tablet Take 1 tablet (64 mg total) by mouth 2 (two) times daily. 60 tablet 2   metoprolol succinate (TOPROL-XL) 25 MG 24 hr tablet Take 25 mg by mouth daily.     naloxone (NARCAN) nasal spray 4 mg/0.1 mL      nystatin  (MYCOSTATIN ) 100000 UNIT/ML suspension Take 5 mLs (500,000 Units total) by mouth 4 (four) times daily for 14 days. Swish in mouth for 2-4 minutes then swallow. 280 mL 0   ondansetron  (ZOFRAN ) 8 MG tablet Take 1 tablet (8 mg total) by mouth every 8 (eight) hours as needed for nausea or vomiting. Start on the third day after chemotherapy. 30 tablet 1   oxyCODONE  HCl 10 MG TABA Take 10 mg by mouth every 4 (four) hours as needed. 14 tablet 0   pantoprazole (PROTONIX) 20 MG tablet Take 20 mg by mouth daily.     potassium chloride  SA (KLOR-CON  M20) 20 MEQ tablet Take 1 tablet (20 mEq total) by mouth 2 (two) times daily. 60 tablet 1   pregabalin (LYRICA) 50 MG capsule Take 50 mg by mouth 3 (three) times daily.     prochlorperazine  (COMPAZINE ) 10 MG tablet Take 1 tablet (10 mg total) by mouth every 6 (six) hours as needed for nausea or vomiting. 30 tablet  1   No current facility-administered medications for this visit.   Facility-Administered Medications Ordered in Other Visits  Medication Dose Route Frequency Provider Last Rate Last Admin   magnesium  sulfate IVPB 2 g 50 mL  2 g Intravenous Once Covington, Sarah M, PA-C        Review of Systems  Constitutional:  Negative for appetite change, chills, fatigue and fever.  HENT:   Negative for hearing loss and voice change.   Eyes:  Negative for eye problems.  Respiratory:  Negative for chest tightness and cough.   Cardiovascular:  Negative for chest pain.  Gastrointestinal:  Negative for abdominal distention, abdominal pain, blood in stool and diarrhea.  Endocrine: Negative for hot flashes.  Genitourinary:  Negative for difficulty urinating and frequency.   Musculoskeletal:  Positive for  arthralgias and back pain.       Leg cramps  Skin:  Negative for itching and rash.  Neurological:  Negative for extremity weakness and headaches.  Hematological:  Negative for adenopathy.  Psychiatric/Behavioral:  Negative for confusion.      PHYSICAL EXAMINATION: ECOG PERFORMANCE STATUS: 1 - Symptomatic but completely ambulatory Vitals:   11/08/23 1110  BP: (!) 112/55  Pulse: 88  Resp: 18  Temp: (!) 97.2 F (36.2 C)  SpO2: 99%   Filed Weights   11/08/23 1110  Weight: 230 lb (104.3 kg)    Physical Exam Constitutional:      General: She is not in acute distress.    Appearance: She is not diaphoretic.  HENT:     Head: Normocephalic and atraumatic.  Eyes:     General: No scleral icterus. Cardiovascular:     Rate and Rhythm: Normal rate and regular rhythm.     Heart sounds: No murmur heard. Pulmonary:     Effort: Pulmonary effort is normal. No respiratory distress.     Breath sounds: No wheezing.  Abdominal:     General: There is no distension.     Palpations: Abdomen is soft.     Tenderness: There is no abdominal tenderness.  Musculoskeletal:        General: Normal range of motion.     Cervical back: Normal range of motion and neck supple.  Skin:    General: Skin is warm and dry.     Findings: No erythema.  Neurological:     Mental Status: She is alert and oriented to person, place, and time.     Cranial Nerves: No cranial nerve deficit.     Motor: No abnormal muscle tone.     Coordination: Coordination normal.  Psychiatric:        Mood and Affect: Affect normal.     Comments:        LABORATORY DATA:  I have reviewed the data as listed    Latest Ref Rng & Units 11/08/2023   10:41 AM 10/25/2023   10:47 AM 10/18/2023   10:45 AM  CBC  WBC 4.0 - 10.5 K/uL 9.3  8.3  5.5   Hemoglobin 12.0 - 15.0 g/dL 9.6  88.0  89.6   Hematocrit 36.0 - 46.0 % 29.7  36.8  32.9   Platelets 150 - 400 K/uL 230  230  440       Latest Ref Rng & Units 11/08/2023   10:41 AM  10/25/2023   10:47 AM 10/18/2023   10:45 AM  CMP  Glucose 70 - 99 mg/dL 888  870  855   BUN 8 - 23 mg/dL 25  36  13   Creatinine 0.44 - 1.00 mg/dL 9.24  8.95  9.30   Sodium 135 - 145 mmol/L 136  130  134   Potassium 3.5 - 5.1 mmol/L 3.6  3.8  3.9   Chloride 98 - 111 mmol/L 105  100  102   CO2 22 - 32 mmol/L 25  21  25    Calcium  8.9 - 10.3 mg/dL 8.6  8.3  8.8   Total Protein 6.5 - 8.1 g/dL 5.8  6.1  6.7   Total Bilirubin 0.0 - 1.2 mg/dL 0.3  0.6  0.3   Alkaline Phos 38 - 126 U/L 100  84  100   AST 15 - 41 U/L 21  23  22    ALT 0 - 44 U/L 33  23  22       RADIOGRAPHIC STUDIES: I have personally reviewed the radiological images as listed and agreed with the findings in the report. CT ABDOMEN PELVIS W CONTRAST Result Date: 10/11/2023 CLINICAL DATA:  Vomiting and diarrhea. EXAM: CT ABDOMEN AND PELVIS WITH CONTRAST TECHNIQUE: Multidetector CT imaging of the abdomen and pelvis was performed using the standard protocol following bolus administration of intravenous contrast. RADIATION DOSE REDUCTION: This exam was performed according to the departmental dose-optimization program which includes automated exposure control, adjustment of the mA and/or kV according to patient size and/or use of iterative reconstruction technique. CONTRAST:  OMNIPAQUE  IOHEXOL  300 MG/ML  SOLN COMPARISON:  April 17, 2021 FINDINGS: Lower chest: No acute abnormality. Hepatobiliary: No focal liver abnormality is seen. No gallstones, gallbladder wall thickening, or biliary dilatation. Pancreas: Unremarkable. No pancreatic ductal dilatation or surrounding inflammatory changes. Spleen: Normal in size without focal abnormality. Adrenals/Urinary Tract: Adrenal glands are unremarkable. Kidneys are normal, without renal calculi, focal lesion, or hydronephrosis. Bladder is unremarkable. Stomach/Bowel: There is a small hiatal hernia. Appendix appears normal. No evidence of bowel wall thickening, distention, or inflammatory changes.  Vascular/Lymphatic: No significant vascular findings are present. No enlarged abdominal or pelvic lymph nodes. Reproductive: Status post hysterectomy. No adnexal masses. Other: No abdominal wall hernia or abnormality. No abdominopelvic ascites. Musculoskeletal: Moderate to marked severity degenerative changes are seen at the level of L5-S1. IMPRESSION: 1. Small hiatal hernia. 2. 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 Electronically Signed   By: Suzen Dials M.D.   On: 10/11/2023 12:53

## 2023-11-08 NOTE — Assessment & Plan Note (Signed)
 Recommend patient to stop Imodium  and continue Lomotil  4 times daily.

## 2023-11-09 ENCOUNTER — Ambulatory Visit: Attending: Oncology

## 2023-11-09 LAB — CANCER ANTIGEN 27.29: CA 27.29: 21.7 U/mL (ref 0.0–38.6)

## 2023-11-09 LAB — CANCER ANTIGEN 15-3: CA 15-3: 15.9 U/mL (ref 0.0–25.0)

## 2023-11-11 ENCOUNTER — Inpatient Hospital Stay

## 2023-11-11 ENCOUNTER — Ambulatory Visit

## 2023-11-11 ENCOUNTER — Encounter

## 2023-11-15 ENCOUNTER — Ambulatory Visit

## 2023-11-16 ENCOUNTER — Ambulatory Visit: Admitting: Oncology

## 2023-11-16 ENCOUNTER — Other Ambulatory Visit

## 2023-11-17 ENCOUNTER — Telehealth: Admitting: Hospice and Palliative Medicine

## 2023-11-17 ENCOUNTER — Other Ambulatory Visit: Payer: Self-pay

## 2023-11-17 ENCOUNTER — Inpatient Hospital Stay

## 2023-11-17 ENCOUNTER — Ambulatory Visit

## 2023-11-17 ENCOUNTER — Telehealth: Payer: Self-pay | Admitting: Oncology

## 2023-11-17 ENCOUNTER — Encounter

## 2023-11-17 DIAGNOSIS — C50912 Malignant neoplasm of unspecified site of left female breast: Secondary | ICD-10-CM

## 2023-11-17 NOTE — Telephone Encounter (Signed)
 Per Dr. Babara, if pt feels like she is dehydrated then she can get [facility] eval

## 2023-11-17 NOTE — Telephone Encounter (Signed)
 Pt called and NUT and Iron  appts for today have been r/s. Pt confirmed iron  appt on Friday 10/17.   Pt stated she is currently at her PCP in [place] and she wants to be scheduled for fluids. She stated her electrolytes are low and she is tired.    Please advise

## 2023-11-17 NOTE — Telephone Encounter (Signed)
 Can you add labs prior to infusion please (cbc, cmp, mag)

## 2023-11-17 NOTE — Telephone Encounter (Signed)
 Dr. Babara, ok to add lab/ IVF on Friday?

## 2023-11-19 ENCOUNTER — Inpatient Hospital Stay

## 2023-11-19 ENCOUNTER — Inpatient Hospital Stay: Admitting: Hospice and Palliative Medicine

## 2023-11-19 VITALS — BP 135/76 | HR 90 | Temp 97.6°F | Resp 18

## 2023-11-19 DIAGNOSIS — Z171 Estrogen receptor negative status [ER-]: Secondary | ICD-10-CM

## 2023-11-19 DIAGNOSIS — K521 Toxic gastroenteritis and colitis: Secondary | ICD-10-CM | POA: Diagnosis not present

## 2023-11-19 DIAGNOSIS — Z5112 Encounter for antineoplastic immunotherapy: Secondary | ICD-10-CM | POA: Diagnosis not present

## 2023-11-19 DIAGNOSIS — E860 Dehydration: Secondary | ICD-10-CM | POA: Diagnosis not present

## 2023-11-19 DIAGNOSIS — C50912 Malignant neoplasm of unspecified site of left female breast: Secondary | ICD-10-CM

## 2023-11-19 DIAGNOSIS — T451X5A Adverse effect of antineoplastic and immunosuppressive drugs, initial encounter: Secondary | ICD-10-CM

## 2023-11-19 DIAGNOSIS — C50011 Malignant neoplasm of nipple and areola, right female breast: Secondary | ICD-10-CM

## 2023-11-19 LAB — CMP (CANCER CENTER ONLY)
ALT: 32 U/L (ref 0–44)
AST: 27 U/L (ref 15–41)
Albumin: 2.7 g/dL — ABNORMAL LOW (ref 3.5–5.0)
Alkaline Phosphatase: 89 U/L (ref 38–126)
Anion gap: 9 (ref 5–15)
BUN: 13 mg/dL (ref 8–23)
CO2: 24 mmol/L (ref 22–32)
Calcium: 8.3 mg/dL — ABNORMAL LOW (ref 8.9–10.3)
Chloride: 104 mmol/L (ref 98–111)
Creatinine: 0.99 mg/dL (ref 0.44–1.00)
GFR, Estimated: >60 mL/min (ref >60)
Glucose, Bld: 113 mg/dL — ABNORMAL HIGH (ref 70–99)
Potassium: 3.2 mmol/L — ABNORMAL LOW (ref 3.5–5.1)
Sodium: 137 mmol/L (ref 135–145)
Total Bilirubin: 0.4 mg/dL (ref 0.0–1.2)
Total Protein: 5.6 g/dL — ABNORMAL LOW (ref 6.5–8.1)

## 2023-11-19 LAB — CBC WITH DIFFERENTIAL (CANCER CENTER ONLY)
Abs Immature Granulocytes: 0.06 K/uL (ref 0.00–0.07)
Basophils Absolute: 0 K/uL (ref 0.0–0.1)
Basophils Relative: 0 %
Eosinophils Absolute: 0.1 K/uL (ref 0.0–0.5)
Eosinophils Relative: 1 %
HCT: 29 % — ABNORMAL LOW (ref 36.0–46.0)
Hemoglobin: 9.1 g/dL — ABNORMAL LOW (ref 12.0–15.0)
Immature Granulocytes: 1 %
Lymphocytes Relative: 28 %
Lymphs Abs: 1.3 K/uL (ref 0.7–4.0)
MCH: 25.6 pg — ABNORMAL LOW (ref 26.0–34.0)
MCHC: 31.4 g/dL (ref 30.0–36.0)
MCV: 81.7 fL (ref 80.0–100.0)
Monocytes Absolute: 0.3 K/uL (ref 0.1–1.0)
Monocytes Relative: 6 %
Neutro Abs: 3 K/uL (ref 1.7–7.7)
Neutrophils Relative %: 64 %
Platelet Count: 183 K/uL (ref 150–400)
RBC: 3.55 MIL/uL — ABNORMAL LOW (ref 3.87–5.11)
RDW: 16.6 % — ABNORMAL HIGH (ref 11.5–15.5)
WBC Count: 4.7 K/uL (ref 4.0–10.5)
nRBC: 0.4 % — ABNORMAL HIGH (ref 0.0–0.2)

## 2023-11-19 LAB — MAGNESIUM: Magnesium: 1.3 mg/dL — ABNORMAL LOW (ref 1.7–2.4)

## 2023-11-19 MED ORDER — IRON SUCROSE 20 MG/ML IV SOLN
200.0000 mg | Freq: Once | INTRAVENOUS | Status: AC
  Administered 2023-11-19: 200 mg via INTRAVENOUS
  Filled 2023-11-19: qty 10

## 2023-11-19 MED ORDER — SODIUM CHLORIDE 0.9% FLUSH
10.0000 mL | Freq: Once | INTRAVENOUS | Status: AC | PRN
  Administered 2023-11-19: 10 mL
  Filled 2023-11-19: qty 10

## 2023-11-19 MED ORDER — POTASSIUM CHLORIDE 10 MEQ/100ML IV SOLN
10.0000 meq | Freq: Once | INTRAVENOUS | Status: AC
  Administered 2023-11-19: 10 meq via INTRAVENOUS
  Filled 2023-11-19: qty 100

## 2023-11-19 MED ORDER — MAGNESIUM SULFATE 2 GM/50ML IV SOLN
2.0000 g | Freq: Once | INTRAVENOUS | Status: AC
  Administered 2023-11-19: 2 g via INTRAVENOUS
  Filled 2023-11-19: qty 50

## 2023-11-19 MED ORDER — SODIUM CHLORIDE 0.9 % IV SOLN
Freq: Once | INTRAVENOUS | Status: AC
  Filled 2023-11-19: qty 250

## 2023-11-19 NOTE — Progress Notes (Signed)
 Symptom Management Clinic [facility] at [facility] [phone] [phone]  Patient Care Team: [facility] as PCP - General Georgina Shasta POUR, RN as Oncology Nurse Navigator [name], MD as Consulting Physician (Oncology) Lenn Aran, MD as Radiation Oncologist (Radiation Oncology)   NAME OF PATIENT: Vanessa Fisher  [id number]  08-07-1960   DATE OF VISIT: 11/19/23  REASON FOR CONSULT: Vanessa Fisher is a 63 y.o. 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  Patient is status post 6 cycles of neoadjuvant TCHP chemotherapy followed by left mastectomy and adjuvant Transtuzumab.  INTERVAL HISTORY: Patient was here for IV Venofer.  She was an add-on to [facility] with complaint of poor oral intake and weakness.  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  She is taking Lomotil .  No active nausea, or vomiting.  No fever or chills.   No urinary complaints.  Patient denies any further complaints today.  PAST MEDICAL HISTORY: Past Medical History:  Diagnosis Date   Anemia    Anxiety    Arthritis    Asthma    WELL CONTROLLED   Bile acid malabsorption syndrome    Bradycardia    HAD AN ISSUE WITH THIS IN 2016-NO PROBLEMS SINCE   Breast cancer, left breast (HCC) 08/2021   Carpal tunnel syndrome on right    Depression    Diabetes mellitus without complication (HCC)    Diverticulitis    Gastric reflux    GERD (gastroesophageal reflux disease)    Hand pain 05/12/2016   Headache    H/O MIGRAINES   History of kidney stones    H/O   Lumbar radiculitis    Neck pain, chronic    Osteoarthritis of both knees    Panic attacks    Personal history of chemotherapy    Personal history of radiation therapy     PAST SURGICAL HISTORY:  Past Surgical History:  Procedure Laterality Date   BREAST BIOPSY Left 08/21/2021   Axilla Bx, Hydromarker, neg   BREAST BIOPSY  Left 08/21/2021   US  Bx, Ribbon Clip, invasive mammary carcinoma   BREAST BIOPSY Left 03/12/2022   US  LT RADIO FREQUENCY TAG LOC US  GUIDE 03/12/2022 [facility]   BREAST BIOPSY Left 03/12/2022   US  LT RADIO FREQUENCY TAG EA ADD LESION LOC US  GUIDE 03/12/2022 [facility]   BREAST LUMPECTOMY Left 03/27/2022   CARPAL TUNNEL RELEASE Right 12/06/2017   Procedure: CARPAL TUNNEL RELEASE;  Surgeon: Cleotilde Barrio, MD;  Location: [facility];  Service: Orthopedics;  Laterality: Right;   COLONOSCOPY WITH PROPOFOL  N/A 06/11/2021   Procedure: COLONOSCOPY WITH PROPOFOL ;  Surgeon: Unk Corinn Skiff, MD;  Location: [facility] END[facility]COPY;  Service: Gastroenterology;  Laterality: N/A;   DORSAL COMPARTMENT RELEASE Right 12/06/2017   Procedure: RELEASE DORSAL COMPARTMENT (DEQUERVAIN);  Surgeon: Cleotilde Barrio, MD;  Location: [facility];  Service: Orthopedics;  Laterality: Right;   ESOPHAGOGASTRODUODENOSCOPY (EGD) WITH PROPOFOL  N/A 06/11/2021   Procedure: ESOPHAGOGASTRODUODENOSCOPY (EGD) WITH PROPOFOL ;  Surgeon: Unk Corinn Skiff, MD;  Location: [facility] ENDOSCOPY;  Service: Gastroenterology;  Laterality: N/A;   INCISION AND DRAINAGE, ABSCESS, BREAST Left 08/27/2023   Procedure: INCISION AND DRAINAGE, ABSCESS, BREAST;  Surgeon: Rodolph Romano, MD;  Location: [facility] OR[facility];  Service: General;  Laterality: Left;   PART MASTECTOMY,RADIO FREQUENCY LOCALIZER,AXILLARY SENTINEL NODE BIOPSY Left 03/27/2022   Procedure: PART MASTECTOMY,RADIO FREQUENCY LOCALIZER,AXILLARY SENTINEL NODE BIOPSY;  Surgeon: Rodolph Romano, MD;  Location: [facility] OR[facility];  Service: General;  Laterality: Left;  PARTIAL HYSTERECTOMY     PORTACATH PLACEMENT N/A 09/24/2021   Procedure: INSERTION PORT-A-CATH;  Surgeon: Rodolph Romano, MD;  Location: [facility];  Service: General;  Laterality: N/A;   TUBAL LIGATION      HEMATOLOGY/ONCOLOGY HISTORY:  Oncology History  Breast cancer (HCC)  07/31/2021 Imaging   Bilateral diagnostic mammogram and US   showed 5 centimeter LEFT breast mass associated with pleomorphic calcifications is suspicious for invasive ductal carcinoma.At least 4 LEFT axillary lymph nodes with abnormal morphology   08/21/2021 Cancer Staging   Staging form: Breast, AJCC 8th Edition - Clinical: Stage IIIB (cT4b, cN3c, cM0, G3, ER-, PR-, HER2+) - Signed by [name], MD on 08/28/2021 Histologic grading system: 3 grade system  -08/21/21 left breast ultrasound-guided biopsy showed invasive mammary carcinoma, grade 3, ER/PR negative, HER2 positive.  Left axillary lymph node biopsy positive for macro metastatic mammary carcinoma, 8 mm in greatest extent.   08/30/2021 Imaging   MRI brain w wo contrast -No metastatic disease or acute intracranial abnormality. Essentially normal for age MRI appearance of the brain.    09/03/2021 Echocardiogram   1. Left ventricular ejection fraction, by estimation, is 55 to 60%. Left ventricular ejection fraction by 3D volume is 58 %. The left ventricle has normal function. The left ventricle has no regional wall motion abnormalities. There is mild left  ventricular hypertrophy. Left ventricular diastolic parameters were normal.  2. Right ventricular systolic function is normal. The right ventricular size is normal.  3. The mitral valve is normal in structure. No evidence of mitral valve regurgitation.  4. The aortic valve was not well visualized. Aortic valve regurgitation is not visualized   09/10/2021 Imaging   PET scan showed Large hypermetabolic left breast mass and diffuse skin thickening,consistent with primary breast carcinoma. Hypermetabolic lymphadenopathy in the left axilla, left subpectoral region, and left supraclavicular region, consistent with metastatic disease. No evidence of metastatic disease within the abdomen or pelvis   09/12/2021 - 02/13/2022 Chemotherapy   Patient is on Treatment Plan : BREAST  Docetaxel  + Carboplatin  + Trastuzumab  + Pertuzumab   (TCHP) q21d       Genetic  Testing   Negative genetic testing. No pathogenic variants identified on the [facility] Common Hereditary Cancers+RNA panel. The report date is 10/26/2021.  The Common Hereditary Cancers Panel + RNA offered by [facility] includes sequencing and/or deletion duplication testing of the following 47 genes: APC, ATM, AXIN2, BARD1, BMPR1A, BRCA1, BRCA2, BRIP1, CDH1, CDKN2A (p14ARF), CDKN2A (p16INK4a), CKD4, CHEK2, CTNNA1, DICER1, EPCAM (Deletion/duplication testing only), GREM1 (promoter region deletion/duplication testing only), KIT, MEN1, MLH1, MSH2, MSH3, MSH6, MUTYH, NBN, NF1, NHTL1, PALB2, PDGFRA, PMS2, POLD1, POLE, PTEN, RAD50, RAD51C, RAD51D, SDHB, SDHC, SDHD, SMAD4, SMARCA4. STK11, TP53, TSC1, TSC2, and VHL.  The following genes were evaluated for sequence changes only: SDHA and HOXB13 c.251G>A variant only.   02/09/2022 Echocardiogram   LVEF 55 to 60%    03/27/2022 Surgery   S/p left mastectomy + SLNB ypT0 ypN0,    04/24/2022 - 12/23/2022 Chemotherapy   Patient is on Treatment Plan : BREAST Trastuzumab   + Pertuzumab  q21d x 13 cycles     05/06/2022 Echocardiogram   LVEF 55 to 60%    07/02/2022 - 08/03/2022 Radiation Therapy   Adjuvant breast radiation.    10/20/2022 Imaging   Bone scan whole body showed 1. Degenerative uptake at the sternoclavicular joints, shoulders,knees and hindfoot bilaterally. 2. No evidence for metastatic disease to the. 3. Uptake in the left breast may be related to postsurgical change or residual  tumor.   07/28/2023 Mammogram   Unilateral left breast mammogram showed  1. Diffuse MEDIAL LEFT breast erythema and pain without interval change in mammographic appearance or suspicious sonographic abnormality. Stable skin thickening in this area is presumably due to prior radiation therapy. Findings are most suspicious for cellulitis. Recommend treatment with antibiotics. If antibiotics fail to resolve the erythema, surgical or dermatologic consultation for skin punch biopsy is  recommended to exclude inflammatory breast cancer.   2. Redemonstrated LEFT breast seroma at the lumpectomy site, now with complex internal debris. Patient requests aspiration of the fluid component of this collection, which will be attempted under ultrasound guidance.   08/27/2023 Relapse/Recurrence   08/27/2023, left breast 11:00 punch biopsy showed 1. Breast, biopsy, left 11:00 punch BX :       - INVASIVE GRADE 3 MAMMARY CARCINOMA  INVADING SKIN AND EPIDERMIS WITH       ULCERATION  ER 70% positive, PR 0%, HER2 positive(3+).    09/08/2023 right axilla lymph node biopsy showed 1. Lymph node, needle/core biopsy, right axilla, hydromark coil clip :       ONE LYMPH NODE, POSITIVE FOR METASTATIC CARCINOMA MORPHOLOGICALLY COMPATIBLE       WITH BREAST PRIMARY (1/1).       METASTATIC FOCUS: 11 MM / 1.1 CM.       EXTRANODAL EXTENSION IDENTIFIE    09/01/2023 Imaging   MRI breast bilaterally without contrast showed  1. Widespread abnormal enhancement involving the skin and nipple of the LEFT breast, involving all 4 quadrants and compatible with known, biopsy proven LEFT breast cancer invading the skin. There is likely slight RETROAREOLAR extension of the nipple involvement extending 8 mm posteriorly from the nipple.   2. Post treatment changes of the LEFT breast without other evidence of recurrent malignancy in the LEFT breast. Thin peripheral enhancement at the lumpectomy site is nonspecific but favored to be due to recent instrumentation.   3. Markedly enlarged RIGHT axillary level 1 lymph nodes, for which second-look ultrasound and ultrasound-guided biopsy is recommended. Small but asymmetric RIGHT axillary level 2 lymph nodes are also suspicious for malignancy but not amenable to percutaneous sampling.   4. Indeterminate RIGHT breast focus at the 9 o'clock periareolar position anterior depth measuring 3 mm and indeterminate RIGHT breast mass at the 9 o'clock central position at  middle depth measuring 5 mm. If management would be impacted, MRI guided biopsies of the focus and mass at 9 o'clock in the RIGHT breast is recommended.   5. Confluent enhancement along the LEFT internal mammary vessels which could reflect ectatic vascular structures or an enlarged LEFT internal mammary lymph node. Recommend attention on forthcoming PET-CT.     09/10/2023 - 09/10/2023 Chemotherapy   Patient is on Treatment Plan : BREAST Fam-Trastuzumab Deruxtecan-nxki  (Enhertu ) (5.4) q21d     09/10/2023 Imaging   PET scan showed  1. Significant hypermetabolism involving skin thickening of the left breast laterally worrisome for recurrent breast cancer. 2. Extensive hypermetabolic right axillary and subpectoral lymphadenopathy consistent with metastatic nodal disease. 3. Hypermetabolic left internal mammary lymphadenopathy. 4. No findings for abdominal/pelvic metastatic disease or osseous metastatic disease. 5. Large fluid collection in the left breast containing gas suggesting a postoperative fluid collection or abscess. 6. Benign hypermetabolic/brown fat in the neck and chest area.   09/27/2023 -  Chemotherapy   Patient is on Treatment Plan : BREAST Fam-Trastuzumab Deruxtecan-nxki  (Enhertu ) (5.4) q21d       ALLERGIES:  is allergic to bc powder [aspirin-salicylamide-caffeine], doxycycline , gabapentin ,  hydrocodone -acetaminophen , latex, penicillin g, penicillins, and shellfish allergy.  MEDICATIONS:  Current Outpatient Medications  Medication Sig Dispense Refill   albuterol  (PROVENTIL ) (2.5 MG/3ML) 0.083% nebulizer solution Take 3 mLs (2.5 mg total) by nebulization every 4 (four) hours as needed for wheezing or shortness of breath. 75 mL 2   albuterol  (VENTOLIN  HFA) 108 (90 Base) MCG/ACT inhaler Inhale 2 puffs into the lungs every 6 (six) hours as needed for wheezing or shortness of breath. 18 g 0   allopurinol (ZYLOPRIM) 100 MG tablet Take 100 mg by mouth daily.     atorvastatin  (LIPITOR) 10 MG tablet Take 10 mg by mouth daily.     Azelastine  HCl 137 MCG/SPRAY SOLN Place 1 spray into the nose daily. 30 mL 0   calcium  carbonate (OS-CAL - DOSED IN MG OF ELEMENTAL CALCIUM ) 1250 (500 Ca) MG tablet Take 1 tablet (1,250 mg total) by mouth daily. 90 tablet 1   celecoxib (CELEBREX) 200 MG capsule Take by mouth 2 (two) times daily.     cetirizine  (ZYRTEC  ALLERGY) 10 MG tablet Take 1 tablet (10 mg total) by mouth daily. 90 tablet 0   cyclobenzaprine  (FLEXERIL ) 10 MG tablet Take 10 mg by mouth 3 (three) times daily.     dexamethasone  (DECADRON ) 4 MG tablet Take 2 tablets (8 mg) by mouth daily for 3 days starting the day after chemotherapy. Take with food. 30 tablet 1   diclofenac Sodium (VOLTAREN) 1 % GEL Apply topically.     diphenoxylate -atropine  (LOMOTIL ) 2.5-0.025 MG tablet Take 1 tablet by mouth 4 (four) times daily as needed for diarrhea or loose stools. 120 tablet 0   docusate sodium  (COLACE) 100 MG capsule Take 1 capsule (100 mg total) by mouth 2 (two) times daily.     DULoxetine (CYMBALTA) 20 MG capsule Take 20 mg by mouth.     escitalopram  (LEXAPRO ) 10 MG tablet Take 1 tablet (10 mg total) by mouth daily. 30 tablet 1   fluconazole  (DIFLUCAN ) 150 MG tablet Take 1 tablet (150 mg total) by mouth once a week. 2 tablet 0   fluticasone  (FLONASE ) 50 MCG/ACT nasal spray Place 2 sprays into both nostrils daily. 15.8 mL 0   lidocaine -prilocaine  (EMLA ) cream Apply 1 Application topically as needed. Apply small amount to port and cover with saran wrap 1-2 hours prior to port access 30 g 12   losartan (COZAAR) 25 MG tablet Take 25 mg by mouth daily.     magic mouthwash (multi-ingredient) oral suspension Swish and swallow 5-10 mLs 4 (four) times daily as needed. 480 mL 3   magic mouthwash w/lidocaine  SOLN Take 5 mLs by mouth 4 (four) times daily as needed for mouth pain. 250 mL 2   magnesium  chloride (SLOW-MAG) 64 MG TBEC SR tablet Take 1 tablet (64 mg total) by mouth 2 (two) times  daily. 60 tablet 2   metoprolol succinate (TOPROL-XL) 25 MG 24 hr tablet Take 25 mg by mouth daily.     naloxone (NARCAN) nasal spray 4 mg/0.1 mL      ondansetron  (ZOFRAN ) 8 MG tablet Take 1 tablet (8 mg total) by mouth every 8 (eight) hours as needed for nausea or vomiting. Start on the third day after chemotherapy. 30 tablet 1   oxyCODONE  HCl 10 MG TABA Take 10 mg by mouth every 4 (four) hours as needed. 14 tablet 0   pantoprazole (PROTONIX) 20 MG tablet Take 20 mg by mouth daily.     potassium chloride  SA (KLOR-CON  M20) 20 MEQ tablet  Take 1 tablet (20 mEq total) by mouth 2 (two) times daily. 60 tablet 1   pregabalin (LYRICA) 50 MG capsule Take 50 mg by mouth 3 (three) times daily.     prochlorperazine  (COMPAZINE ) 10 MG tablet Take 1 tablet (10 mg total) by mouth every 6 (six) hours as needed for nausea or vomiting. 30 tablet 1   No current facility-administered medications for this visit.   Facility-Administered Medications Ordered in Other Visits  Medication Dose Route Frequency Provider Last Rate Last Admin   0.9 %  sodium chloride  infusion   Intravenous Once [name], MD       magnesium  sulfate IVPB 2 g 50 mL  2 g Intravenous Once C[place]ton, Sarah M, PA-C       magnesium  sulfate IVPB 2 g 50 mL  2 g Intravenous Once [name], MD        VITAL SIGNS: There were no vitals taken for this visit. There were no vitals filed for this visit.   Estimated body mass index is 37.12 kg/m as calculated from the following:   Height as of 11/08/23: 5' 6 (1.676 m).   Weight as of 11/08/23: 230 lb (104.3 kg).  LABS: CBC:    Component Value Date/Time   WBC 4.7 11/19/2023 1147   WBC 8.3 10/25/2023 1047   HGB 9.1 (L) 11/19/2023 1147   HGB 11.9 (L) 05/27/2014 2215   HCT 29.0 (L) 11/19/2023 1147   HCT 36.4 05/27/2014 2215   PLT 183 11/19/2023 1147   PLT 293 05/27/2014 2215   MCV 81.7 11/19/2023 1147   MCV 81 05/27/2014 2215   NEUTROABS 3.0 11/19/2023 1147   NEUTROABS 5.4 05/27/2014 2215    LYMPHSABS 1.3 11/19/2023 1147   LYMPHSABS 3.0 05/27/2014 2215   MONOABS 0.3 11/19/2023 1147   MONOABS 0.6 05/27/2014 2215   EOSABS 0.1 11/19/2023 1147   EOSABS 0.0 05/27/2014 2215   BASOSABS 0.0 11/19/2023 1147   BASOSABS 0.0 05/27/2014 2215   Comprehensive Metabolic Panel:    Component Value Date/Time   NA 137 11/19/2023 1147   NA 143 07/08/2016 1259   NA 139 05/27/2014 2215   K 3.2 (L) 11/19/2023 1147   K 3.5 05/27/2014 2215   CL 104 11/19/2023 1147   CL 105 05/27/2014 2215   CO2 24 11/19/2023 1147   CO2 26 05/27/2014 2215   BUN 13 11/19/2023 1147   BUN 10 07/08/2016 1259   BUN 14 05/27/2014 2215   CREATININE 0.99 11/19/2023 1147   CREATININE 1.09 (H) 05/27/2014 2215   GLUCOSE 113 (H) 11/19/2023 1147   GLUCOSE 104 (H) 05/27/2014 2215   CALCIUM  8.3 (L) 11/19/2023 1147   CALCIUM  9.0 05/27/2014 2215   AST 27 11/19/2023 1147   ALT 32 11/19/2023 1147   ALT 24 08/05/2013 0832   ALKPHOS 89 11/19/2023 1147   ALKPHOS 97 08/05/2013 0832   BILITOT 0.4 11/19/2023 1147   PROT 5.6 (L) 11/19/2023 1147   PROT 7.0 07/08/2016 1259   PROT 6.8 08/05/2013 0832   ALBUMIN 2.7 (L) 11/19/2023 1147   ALBUMIN 4.0 07/08/2016 1259   ALBUMIN 3.4 08/05/2013 0832    RADIOGRAPHIC STUDIES: No results found.  PERFORMANCE STATUS (ECOG) : 1 - Symptomatic but completely ambulatory  Review of Systems Unless otherwise noted, a complete review of systems is negative.  Physical Exam General: NAD Cardiovascular: regular rate and rhythm Pulmonary: clear ant fields Abdomen: soft, nontender, + bowel sounds GU: no suprapubic tenderness Extremities: no edema, no joint deformities Skin:  no rashes Neurological: Weakness but otherwise nonfocal   IMPRESSION/PLAN: Breast cancer -on Enhertu   Diarrhea -likely secondary to chemotherapy.  However, we will obtain stool studies and rule out infectious etiology.  Supportive care.  Dehydration -proceed with IV fluids today  Hypokalemia/hypomagnesemia  -replete with IV K and mag.  Increase oral potassium supplement 20 mEq 3 times daily  Labs/fluids next week. RTC as scheduled to see Dr. Babara on 10/27   Patient expressed understanding and was in agreement with this plan. She also understands that She can call clinic at any time with any questions, concerns, or complaints.   Thank you for allowing me to participate in the care of this very pleasant patient.   Time Total: 25 minutes  Visit consisted of counseling and education dealing with the complex and emotionally intense issues of symptom management in the setting of serious illness.Greater than 50%  of this time was spent counseling and coordinating care related to the above assessment and plan.  Signed by: Fonda Mower, PhD, NP-C

## 2023-11-21 ENCOUNTER — Other Ambulatory Visit: Payer: Self-pay | Admitting: Nurse Practitioner

## 2023-11-21 DIAGNOSIS — E876 Hypokalemia: Secondary | ICD-10-CM

## 2023-11-22 ENCOUNTER — Inpatient Hospital Stay

## 2023-11-22 NOTE — Progress Notes (Signed)
 [facility] CSW Progress Note  Clinical Child psychotherapist contacted patient by phone to follow-up on need for community resources.    Interventions: Provided patient with information about community resources.  She also inquired about the [employer].  CSW to consult supervisor regarding availability since she previously exhausted the grant.       Follow Up Plan:  CSW will follow-up with patient by phone     Macario CHRISTELLA Au, LCSW Clinical Social Worker [facility]    Patient is participating in a Managed Medicaid Plan:  Yes

## 2023-11-25 ENCOUNTER — Inpatient Hospital Stay

## 2023-11-25 ENCOUNTER — Other Ambulatory Visit: Payer: Self-pay

## 2023-11-25 VITALS — BP 116/55 | HR 94

## 2023-11-25 VITALS — BP 109/71 | HR 85 | Temp 97.7°F | Resp 19

## 2023-11-25 DIAGNOSIS — Z5112 Encounter for antineoplastic immunotherapy: Secondary | ICD-10-CM | POA: Diagnosis not present

## 2023-11-25 DIAGNOSIS — K521 Toxic gastroenteritis and colitis: Secondary | ICD-10-CM

## 2023-11-25 DIAGNOSIS — C50912 Malignant neoplasm of unspecified site of left female breast: Secondary | ICD-10-CM

## 2023-11-25 DIAGNOSIS — C50011 Malignant neoplasm of nipple and areola, right female breast: Secondary | ICD-10-CM

## 2023-11-25 DIAGNOSIS — M7989 Other specified soft tissue disorders: Secondary | ICD-10-CM

## 2023-11-25 LAB — CBC WITH DIFFERENTIAL (CANCER CENTER ONLY)
Abs Immature Granulocytes: 0.17 K/uL — ABNORMAL HIGH (ref 0.00–0.07)
Basophils Absolute: 0.1 K/uL (ref 0.0–0.1)
Basophils Relative: 1 %
Eosinophils Absolute: 0.1 K/uL (ref 0.0–0.5)
Eosinophils Relative: 3 %
HCT: 26.6 % — ABNORMAL LOW (ref 36.0–46.0)
Hemoglobin: 8.2 g/dL — ABNORMAL LOW (ref 12.0–15.0)
Immature Granulocytes: 4 %
Lymphocytes Relative: 41 %
Lymphs Abs: 1.8 K/uL (ref 0.7–4.0)
MCH: 26.4 pg (ref 26.0–34.0)
MCHC: 30.8 g/dL (ref 30.0–36.0)
MCV: 85.5 fL (ref 80.0–100.0)
Monocytes Absolute: 0.5 K/uL (ref 0.1–1.0)
Monocytes Relative: 12 %
Neutro Abs: 1.7 K/uL (ref 1.7–7.7)
Neutrophils Relative %: 39 %
Platelet Count: 270 K/uL (ref 150–400)
RBC: 3.11 MIL/uL — ABNORMAL LOW (ref 3.87–5.11)
RDW: 19.4 % — ABNORMAL HIGH (ref 11.5–15.5)
Smear Review: NORMAL
WBC Count: 4.4 K/uL (ref 4.0–10.5)
nRBC: 0.7 % — ABNORMAL HIGH (ref 0.0–0.2)

## 2023-11-25 LAB — CMP (CANCER CENTER ONLY)
ALT: 28 U/L (ref 0–44)
AST: 25 U/L (ref 15–41)
Albumin: 2.7 g/dL — ABNORMAL LOW (ref 3.5–5.0)
Alkaline Phosphatase: 92 U/L (ref 38–126)
Anion gap: 6 (ref 5–15)
BUN: 12 mg/dL (ref 8–23)
CO2: 25 mmol/L (ref 22–32)
Calcium: 8.1 mg/dL — ABNORMAL LOW (ref 8.9–10.3)
Chloride: 106 mmol/L (ref 98–111)
Creatinine: 0.83 mg/dL (ref 0.44–1.00)
GFR, Estimated: >60 mL/min (ref >60)
Glucose, Bld: 120 mg/dL — ABNORMAL HIGH (ref 70–99)
Potassium: 3.4 mmol/L — ABNORMAL LOW (ref 3.5–5.1)
Sodium: 137 mmol/L (ref 135–145)
Total Bilirubin: 0.4 mg/dL (ref 0.0–1.2)
Total Protein: 5.5 g/dL — ABNORMAL LOW (ref 6.5–8.1)

## 2023-11-25 LAB — MAGNESIUM: Magnesium: 1.6 mg/dL — ABNORMAL LOW (ref 1.7–2.4)

## 2023-11-25 MED ORDER — MAGNESIUM SULFATE 2 GM/50ML IV SOLN
2.0000 g | Freq: Once | INTRAVENOUS | Status: AC
  Administered 2023-11-25: 2 g via INTRAVENOUS
  Filled 2023-11-25: qty 50

## 2023-11-25 MED ORDER — IRON SUCROSE 20 MG/ML IV SOLN
200.0000 mg | Freq: Once | INTRAVENOUS | Status: AC
  Administered 2023-11-25: 200 mg via INTRAVENOUS
  Filled 2023-11-25: qty 10

## 2023-11-25 NOTE — Progress Notes (Signed)
 Nutrition Follow-up:  Re-consulted  Patient with recurrent stage IV breast cancer.  Patient receiving enhertu .  Met with patient in infusion.  Reports that appetite is fair.  Eating a variety of foods (meats, vegetables, fruits, breads, tomato soup). Has backed off boost shakes because was not sure if they were causing diarrhea.  Has been having issues with diarrhea.  Drinking broth based soups, water , electrolyte drinks.      Medications: reviewed  Labs: K 3.4, glucose 120, Mag 1.6  Anthropometrics:   Weight 230 lb 10/6 238 lb on 8/25 209 lb on 05/15/22 (last seen by RD)    NUTRITION DIAGNOSIS: Inadequate oral intake related to diarrhea    INTERVENTION:  Discussed foods to choose with diarrhea.  Handout provided Encouraged antidiarrheal medications    MONITORING, EVALUATION, GOAL: weight trends, intake   NEXT VISIT: as needed  Fatime Biswell B. Dasie SOLON, CSO, LDN Registered Dietitian [phone]

## 2023-11-26 ENCOUNTER — Encounter: Payer: Self-pay | Admitting: Oncology

## 2023-11-26 MED FILL — Fosaprepitant Dimeglumine For IV Infusion 150 MG (Base Eq): INTRAVENOUS | Qty: 5 | Status: AC

## 2023-11-29 ENCOUNTER — Ambulatory Visit

## 2023-11-29 ENCOUNTER — Other Ambulatory Visit

## 2023-11-29 ENCOUNTER — Inpatient Hospital Stay: Admitting: Oncology

## 2023-11-29 ENCOUNTER — Inpatient Hospital Stay

## 2023-11-29 ENCOUNTER — Ambulatory Visit: Admitting: Hospice and Palliative Medicine

## 2023-11-29 ENCOUNTER — Ambulatory Visit: Admitting: Oncology

## 2023-11-29 ENCOUNTER — Inpatient Hospital Stay: Admitting: Hospice and Palliative Medicine

## 2023-11-29 ENCOUNTER — Ambulatory Visit
Admission: RE | Admit: 2023-11-29 | Discharge: 2023-11-29 | Disposition: A | Source: Ambulatory Visit | Attending: Oncology

## 2023-11-29 ENCOUNTER — Encounter: Payer: Self-pay | Admitting: Oncology

## 2023-11-29 ENCOUNTER — Ambulatory Visit
Admission: RE | Admit: 2023-11-29 | Discharge: 2023-11-29 | Disposition: A | Discharge status: Home / Self Care | Source: Ambulatory Visit | Attending: Oncology | Admitting: Oncology

## 2023-11-29 DIAGNOSIS — C50911 Malignant neoplasm of unspecified site of right female breast: Secondary | ICD-10-CM | POA: Diagnosis not present

## 2023-11-29 DIAGNOSIS — C50812 Malignant neoplasm of overlapping sites of left female breast: Secondary | ICD-10-CM

## 2023-11-29 DIAGNOSIS — M7989 Other specified soft tissue disorders: Secondary | ICD-10-CM | POA: Diagnosis present

## 2023-11-29 DIAGNOSIS — Z171 Estrogen receptor negative status [ER-]: Secondary | ICD-10-CM | POA: Insufficient documentation

## 2023-11-29 DIAGNOSIS — D649 Anemia, unspecified: Secondary | ICD-10-CM

## 2023-11-29 DIAGNOSIS — Z5112 Encounter for antineoplastic immunotherapy: Secondary | ICD-10-CM | POA: Diagnosis not present

## 2023-11-29 LAB — CMP (CANCER CENTER ONLY)
ALT: 26 U/L (ref 0–44)
AST: 24 U/L (ref 15–41)
Albumin: 2.9 g/dL — ABNORMAL LOW (ref 3.5–5.0)
Alkaline Phosphatase: 102 U/L (ref 38–126)
Anion gap: 6 (ref 5–15)
BUN: 15 mg/dL (ref 8–23)
CO2: 27 mmol/L (ref 22–32)
Calcium: 8.6 mg/dL — ABNORMAL LOW (ref 8.9–10.3)
Chloride: 104 mmol/L (ref 98–111)
Creatinine: 0.99 mg/dL (ref 0.44–1.00)
GFR, Estimated: >60 mL/min (ref >60)
Glucose, Bld: 120 mg/dL — ABNORMAL HIGH (ref 70–99)
Potassium: 4 mmol/L (ref 3.5–5.1)
Sodium: 137 mmol/L (ref 135–145)
Total Bilirubin: 0.4 mg/dL (ref 0.0–1.2)
Total Protein: 5.8 g/dL — ABNORMAL LOW (ref 6.5–8.1)

## 2023-11-29 LAB — CBC WITH DIFFERENTIAL (CANCER CENTER ONLY)
Abs Immature Granulocytes: 0.27 K/uL — ABNORMAL HIGH (ref 0.00–0.07)
Basophils Absolute: 0 K/uL (ref 0.0–0.1)
Basophils Relative: 1 %
Eosinophils Absolute: 0.1 K/uL (ref 0.0–0.5)
Eosinophils Relative: 2 %
HCT: 28.9 % — ABNORMAL LOW (ref 36.0–46.0)
Hemoglobin: 8.9 g/dL — ABNORMAL LOW (ref 12.0–15.0)
Immature Granulocytes: 5 %
Lymphocytes Relative: 34 %
Lymphs Abs: 2 K/uL (ref 0.7–4.0)
MCH: 27 pg (ref 26.0–34.0)
MCHC: 30.8 g/dL (ref 30.0–36.0)
MCV: 87.6 fL (ref 80.0–100.0)
Monocytes Absolute: 0.7 K/uL (ref 0.1–1.0)
Monocytes Relative: 12 %
Neutro Abs: 2.8 K/uL (ref 1.7–7.7)
Neutrophils Relative %: 46 %
Platelet Count: 276 K/uL (ref 150–400)
RBC: 3.3 MIL/uL — ABNORMAL LOW (ref 3.87–5.11)
RDW: 20.5 % — ABNORMAL HIGH (ref 11.5–15.5)
WBC Count: 5.9 K/uL (ref 4.0–10.5)
nRBC: 0.5 % — ABNORMAL HIGH (ref 0.0–0.2)

## 2023-11-29 LAB — MAGNESIUM: Magnesium: 1.4 mg/dL — ABNORMAL LOW (ref 1.7–2.4)

## 2023-11-29 MED ORDER — MAGNESIUM SULFATE 2 GM/50ML IV SOLN
2.0000 g | Freq: Once | INTRAVENOUS | Status: AC
  Administered 2023-11-29: 2 g via INTRAVENOUS
  Filled 2023-11-29: qty 50

## 2023-11-29 NOTE — Assessment & Plan Note (Signed)
Hemoglobin is stable. Monitor counts.

## 2023-11-29 NOTE — Assessment & Plan Note (Addendum)
 Continue Slow Mag 1 tab BID IV magnesium  4 g x 1 today.

## 2023-11-29 NOTE — Assessment & Plan Note (Signed)
 Recommend left upper extremity ultrasound for further evaluation.

## 2023-11-29 NOTE — Progress Notes (Signed)
 Palliative Medicine [facility] at [facility] Telephone:(336) [phone] Fax:(336) [phone]   Name: Vanessa Fisher Date: 11/29/2023 MRN: [id number]  DOB: 12/22/1960  Patient Care Team: [facility] as PCP - General Georgina Shasta POUR, RN as Oncology Nurse Navigator [name], MD as Consulting Physician (Oncology) Lenn Aran, MD as Radiation Oncologist (Radiation Oncology)    REASON FOR CONSULTATION: Vanessa Fisher is a 63 y.o. 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  Palliative care consulted to address goals.  SOCIAL HISTORY:     reports that she has quit smoking. Her smoking use included cigarettes. She has never used smokeless tobacco. She reports that she does not drink alcohol and does not use drugs.  Patient is unmarried.  She lives at home alone.  Her oldest son died of cancer.  She has a son and daughter who live nearby.  She also has a sister who is involved.  Patient previously worked at [employer].  ADVANCE DIRECTIVES:    CODE STATUS:   PAST MEDICAL HISTORY: Past Medical History:  Diagnosis Date   Anemia    Anxiety    Arthritis    Asthma    WELL CONTROLLED   Bile acid malabsorption syndrome    Bradycardia    HAD AN ISSUE WITH THIS IN 2016-NO PROBLEMS SINCE   Breast cancer, left breast (HCC) 08/2021   Carpal tunnel syndrome on right    Depression    Diabetes mellitus without complication (HCC)    Diverticulitis    Gastric reflux    GERD (gastroesophageal reflux disease)    Hand pain 05/12/2016   Headache    H/O MIGRAINES   History of kidney stones    H/O   Lumbar radiculitis    Neck pain, chronic    Osteoarthritis of both knees    Panic attacks    Personal history of chemotherapy    Personal history of radiation therapy     PAST SURGICAL HISTORY:  Past Surgical History:  Procedure Laterality Date   BREAST BIOPSY Left 08/21/2021   Axilla  Bx, Hydromarker, neg   BREAST BIOPSY Left 08/21/2021   US  Bx, Ribbon Clip, invasive mammary carcinoma   BREAST BIOPSY Left 03/12/2022   US  LT RADIO FREQUENCY TAG LOC US  GUIDE 03/12/2022 [facility]   BREAST BIOPSY Left 03/12/2022   US  LT RADIO FREQUENCY TAG EA ADD LESION LOC US  GUIDE 03/12/2022 [facility]   BREAST LUMPECTOMY Left 03/27/2022   CARPAL TUNNEL RELEASE Right 12/06/2017   Procedure: CARPAL TUNNEL RELEASE;  Surgeon: Cleotilde Barrio, MD;  Location: [facility];  Service: Orthopedics;  Laterality: Right;   COLONOSCOPY WITH PROPOFOL  N/A 06/11/2021   Procedure: COLONOSCOPY WITH PROPOFOL ;  Surgeon: Unk Corinn Skiff, MD;  Location: [facility];  Service: Gastroenterology;  Laterality: N/A;   DORSAL COMPARTMENT RELEASE Right 12/06/2017   Procedure: RELEASE DORSAL COMPARTMENT (DEQUERVAIN);  Surgeon: Cleotilde Barrio, MD;  Location: [facility];  Service: Orthopedics;  Laterality: Right;   ESOPHAGOGASTRODUODENOSCOPY (EGD) WITH PROPOFOL  N/A 06/11/2021   Procedure: ESOPHAGOGASTRODUODENOSCOPY (EGD) WITH PROPOFOL ;  Surgeon: Unk Corinn Skiff, MD;  Location: [facility] ENDOSCOPY;  Service: Gastroenterology;  Laterality: N/A;   INCISION AND DRAINAGE, ABSCESS, BREAST Left 08/27/2023   Procedure: INCISION AND DRAINAGE, ABSCESS, BREAST;  Surgeon: Rodolph Romano, MD;  Location: [facility];  Service: General;  Laterality: Left;   PART MASTECTOMY,RADIO FREQUENCY LOCALIZER,AXILLARY SENTINEL NODE BIOPSY Left 03/27/2022   Procedure: PART MASTECTOMY,RADIO FREQUENCY LOCALIZER,AXILLARY SENTINEL NODE BIOPSY;  Surgeon: Rodolph Romano, MD;  Location: [facility];  Service: General;  Laterality: Left;   PARTIAL HYSTERECTOMY     PORTACATH PLACEMENT N/A 09/24/2021   Procedure: INSERTION PORT-A-CATH;  Surgeon: Rodolph Romano, MD;  Location: [facility];  Service: General;  Laterality: N/A;   TUBAL LIGATION      HEMATOLOGY/ONCOLOGY HISTORY:  Oncology History  Breast cancer (HCC)  07/31/2021 Imaging    Bilateral diagnostic mammogram and US  showed 5 centimeter LEFT breast mass associated with pleomorphic calcifications is suspicious for invasive ductal carcinoma.At least 4 LEFT axillary lymph nodes with abnormal morphology   08/21/2021 Cancer Staging   Staging form: Breast, AJCC 8th Edition - Clinical: Stage IIIB (cT4b, cN3c, cM0, G3, ER-, PR-, HER2+) - Signed by [name], MD on 08/28/2021 Histologic grading system: 3 grade system  -08/21/21 left breast ultrasound-guided biopsy showed invasive mammary carcinoma, grade 3, ER/PR negative, HER2 positive.  Left axillary lymph node biopsy positive for macro metastatic mammary carcinoma, 8 mm in greatest extent.   08/30/2021 Imaging   MRI brain w wo contrast -No metastatic disease or acute intracranial abnormality. Essentially normal for age MRI appearance of the brain.    09/03/2021 Echocardiogram   1. Left ventricular ejection fraction, by estimation, is 55 to 60%. Left ventricular ejection fraction by 3D volume is 58 %. The left ventricle has normal function. The left ventricle has no regional wall motion abnormalities. There is mild left  ventricular hypertrophy. Left ventricular diastolic parameters were normal.  2. Right ventricular systolic function is normal. The right ventricular size is normal.  3. The mitral valve is normal in structure. No evidence of mitral valve regurgitation.  4. The aortic valve was not well visualized. Aortic valve regurgitation is not visualized   09/10/2021 Imaging   PET scan showed Large hypermetabolic left breast mass and diffuse skin thickening,consistent with primary breast carcinoma. Hypermetabolic lymphadenopathy in the left axilla, left subpectoral region, and left supraclavicular region, consistent with metastatic disease. No evidence of metastatic disease within the abdomen or pelvis   09/12/2021 - 02/13/2022 Chemotherapy   Patient is on Treatment Plan : BREAST  Docetaxel  + Carboplatin  + Trastuzumab  +  Pertuzumab   (TCHP) q21d       Genetic Testing   Negative genetic testing. No pathogenic variants identified on the [facility] Common Hereditary Cancers+RNA panel. The report date is 10/26/2021.  The Common Hereditary Cancers Panel + RNA offered by [facility] includes sequencing and/or deletion duplication testing of the following 47 genes: APC, ATM, AXIN2, BARD1, BMPR1A, BRCA1, BRCA2, BRIP1, CDH1, CDKN2A (p14ARF), CDKN2A (p16INK4a), CKD4, CHEK2, CTNNA1, DICER1, EPCAM (Deletion/duplication testing only), GREM1 (promoter region deletion/duplication testing only), KIT, MEN1, MLH1, MSH2, MSH3, MSH6, MUTYH, NBN, NF1, NHTL1, PALB2, PDGFRA, PMS2, POLD1, POLE, PTEN, RAD50, RAD51C, RAD51D, SDHB, SDHC, SDHD, SMAD4, SMARCA4. STK11, TP53, TSC1, TSC2, and VHL.  The following genes were evaluated for sequence changes only: SDHA and HOXB13 c.251G>A variant only.   02/09/2022 Echocardiogram   LVEF 55 to 60%    03/27/2022 Surgery   S/p left mastectomy + SLNB ypT0 ypN0,    04/24/2022 - 12/23/2022 Chemotherapy   Patient is on Treatment Plan : BREAST Trastuzumab   + Pertuzumab  q21d x 13 cycles     05/06/2022 Echocardiogram   LVEF 55 to 60%    07/02/2022 - 08/03/2022 Radiation Therapy   Adjuvant breast radiation.    10/20/2022 Imaging   Bone scan whole body showed 1. Degenerative uptake at the sternoclavicular joints, shoulders,knees and hindfoot bilaterally. 2. No evidence for metastatic disease  to the. 3. Uptake in the left breast may be related to postsurgical change or residual tumor.   07/28/2023 Mammogram   Unilateral left breast mammogram showed  1. Diffuse MEDIAL LEFT breast erythema and pain without interval change in mammographic appearance or suspicious sonographic abnormality. Stable skin thickening in this area is presumably due to prior radiation therapy. Findings are most suspicious for cellulitis. Recommend treatment with antibiotics. If antibiotics fail to resolve the erythema, surgical or  dermatologic consultation for skin punch biopsy is recommended to exclude inflammatory breast cancer.   2. Redemonstrated LEFT breast seroma at the lumpectomy site, now with complex internal debris. Patient requests aspiration of the fluid component of this collection, which will be attempted under ultrasound guidance.   08/27/2023 Relapse/Recurrence   08/27/2023, left breast 11:00 punch biopsy showed 1. Breast, biopsy, left 11:00 punch BX :       - INVASIVE GRADE 3 MAMMARY CARCINOMA  INVADING SKIN AND EPIDERMIS WITH       ULCERATION  ER 70% positive, PR 0%, HER2 positive(3+).    09/08/2023 right axilla lymph node biopsy showed 1. Lymph node, needle/core biopsy, right axilla, hydromark coil clip :       ONE LYMPH NODE, POSITIVE FOR METASTATIC CARCINOMA MORPHOLOGICALLY COMPATIBLE       WITH BREAST PRIMARY (1/1).       METASTATIC FOCUS: 11 MM / 1.1 CM.       EXTRANODAL EXTENSION IDENTIFIE    09/01/2023 Imaging   MRI breast bilaterally without contrast showed  1. Widespread abnormal enhancement involving the skin and nipple of the LEFT breast, involving all 4 quadrants and compatible with known, biopsy proven LEFT breast cancer invading the skin. There is likely slight RETROAREOLAR extension of the nipple involvement extending 8 mm posteriorly from the nipple.   2. Post treatment changes of the LEFT breast without other evidence of recurrent malignancy in the LEFT breast. Thin peripheral enhancement at the lumpectomy site is nonspecific but favored to be due to recent instrumentation.   3. Markedly enlarged RIGHT axillary level 1 lymph nodes, for which second-look ultrasound and ultrasound-guided biopsy is recommended. Small but asymmetric RIGHT axillary level 2 lymph nodes are also suspicious for malignancy but not amenable to percutaneous sampling.   4. Indeterminate RIGHT breast focus at the 9 o'clock periareolar position anterior depth measuring 3 mm and indeterminate  RIGHT breast mass at the 9 o'clock central position at middle depth measuring 5 mm. If management would be impacted, MRI guided biopsies of the focus and mass at 9 o'clock in the RIGHT breast is recommended.   5. Confluent enhancement along the LEFT internal mammary vessels which could reflect ectatic vascular structures or an enlarged LEFT internal mammary lymph node. Recommend attention on forthcoming PET-CT.     09/10/2023 - 09/10/2023 Chemotherapy   Patient is on Treatment Plan : BREAST Fam-Trastuzumab Deruxtecan-nxki  (Enhertu ) (5.4) q21d     09/10/2023 Imaging   PET scan showed  1. Significant hypermetabolism involving skin thickening of the left breast laterally worrisome for recurrent breast cancer. 2. Extensive hypermetabolic right axillary and subpectoral lymphadenopathy consistent with metastatic nodal disease. 3. Hypermetabolic left internal mammary lymphadenopathy. 4. No findings for abdominal/pelvic metastatic disease or osseous metastatic disease. 5. Large fluid collection in the left breast containing gas suggesting a postoperative fluid collection or abscess. 6. Benign hypermetabolic/brown fat in the neck and chest area.   09/27/2023 -  Chemotherapy   Patient is on Treatment Plan : BREAST Fam-Trastuzumab Deruxtecan-nxki  (Enhertu ) (5.4) q21d  ALLERGIES:  is allergic to bc powder [aspirin-salicylamide-caffeine], doxycycline , gabapentin , hydrocodone -acetaminophen , latex, penicillin g, penicillins, and shellfish allergy.  MEDICATIONS:  Current Outpatient Medications  Medication Sig Dispense Refill   albuterol  (PROVENTIL ) (2.5 MG/3ML) 0.083% nebulizer solution Take 3 mLs (2.5 mg total) by nebulization every 4 (four) hours as needed for wheezing or shortness of breath. 75 mL 2   albuterol  (VENTOLIN  HFA) 108 (90 Base) MCG/ACT inhaler Inhale 2 puffs into the lungs every 6 (six) hours as needed for wheezing or shortness of breath. 18 g 0   allopurinol (ZYLOPRIM) 100 MG  tablet Take 100 mg by mouth daily.     atorvastatin (LIPITOR) 10 MG tablet Take 10 mg by mouth daily.     Azelastine  HCl 137 MCG/SPRAY SOLN Place 1 spray into the nose daily. 30 mL 0   calcium  carbonate (OS-CAL - DOSED IN MG OF ELEMENTAL CALCIUM ) 1250 (500 Ca) MG tablet Take 1 tablet (1,250 mg total) by mouth daily. 90 tablet 1   celecoxib (CELEBREX) 200 MG capsule Take by mouth 2 (two) times daily.     cetirizine  (ZYRTEC  ALLERGY) 10 MG tablet Take 1 tablet (10 mg total) by mouth daily. 90 tablet 0   cyclobenzaprine  (FLEXERIL ) 10 MG tablet Take 10 mg by mouth 3 (three) times daily.     dexamethasone  (DECADRON ) 4 MG tablet Take 2 tablets (8 mg) by mouth daily for 3 days starting the day after chemotherapy. Take with food. 30 tablet 1   diclofenac Sodium (VOLTAREN) 1 % GEL Apply topically.     diphenoxylate -atropine  (LOMOTIL ) 2.5-0.025 MG tablet Take 1 tablet by mouth 4 (four) times daily as needed for diarrhea or loose stools. 120 tablet 0   docusate sodium  (COLACE) 100 MG capsule Take 1 capsule (100 mg total) by mouth 2 (two) times daily.     DULoxetine (CYMBALTA) 20 MG capsule Take 20 mg by mouth.     escitalopram  (LEXAPRO ) 10 MG tablet Take 1 tablet (10 mg total) by mouth daily. 30 tablet 1   fluconazole  (DIFLUCAN ) 150 MG tablet Take 1 tablet (150 mg total) by mouth once a week. 2 tablet 0   fluticasone  (FLONASE ) 50 MCG/ACT nasal spray Place 2 sprays into both nostrils daily. 15.8 mL 0   lidocaine -prilocaine  (EMLA ) cream Apply 1 Application topically as needed. Apply small amount to port and cover with saran wrap 1-2 hours prior to port access 30 g 12   losartan (COZAAR) 25 MG tablet Take 25 mg by mouth daily.     magic mouthwash (multi-ingredient) oral suspension Swish and swallow 5-10 mLs 4 (four) times daily as needed. 480 mL 3   magic mouthwash w/lidocaine  SOLN Take 5 mLs by mouth 4 (four) times daily as needed for mouth pain. 250 mL 2   magnesium  chloride (SLOW-MAG) 64 MG TBEC SR tablet  Take 1 tablet (64 mg total) by mouth 2 (two) times daily. 60 tablet 2   metoprolol succinate (TOPROL-XL) 25 MG 24 hr tablet Take 25 mg by mouth daily.     naloxone (NARCAN) nasal spray 4 mg/0.1 mL  (Patient not taking: Reported on 11/29/2023)     ondansetron  (ZOFRAN ) 8 MG tablet Take 1 tablet (8 mg total) by mouth every 8 (eight) hours as needed for nausea or vomiting. Start on the third day after chemotherapy. 30 tablet 1   oxyCODONE  HCl 10 MG TABA Take 10 mg by mouth every 4 (four) hours as needed. 14 tablet 0   pantoprazole (PROTONIX) 20 MG tablet Take 20  mg by mouth daily.     potassium chloride  SA (KLOR-CON  M20) 20 MEQ tablet Take 1 tablet (20 mEq total) by mouth 2 (two) times daily. 60 tablet 1   pregabalin (LYRICA) 50 MG capsule Take 50 mg by mouth 3 (three) times daily.     prochlorperazine  (COMPAZINE ) 10 MG tablet Take 1 tablet (10 mg total) by mouth every 6 (six) hours as needed for nausea or vomiting. 30 tablet 1   No current facility-administered medications for this visit.   Facility-Administered Medications Ordered in Other Visits  Medication Dose Route Frequency Provider Last Rate Last Admin   magnesium  sulfate IVPB 2 g 50 mL  2 g Intravenous Once Covington, Sarah M, PA-C       magnesium  sulfate IVPB 2 g 50 mL  2 g Intravenous Once [name], MD 50 mL/hr at 11/29/23 1107 2 g at 11/29/23 1107    VITAL SIGNS: There were no vitals taken for this visit. There were no vitals filed for this visit.  Estimated body mass index is 38.75 kg/m as calculated from the following:   Height as of 11/08/23: 5' 6 (1.676 m).   Weight as of an earlier encounter on 11/29/23: 240 lb 1.6 oz (108.9 kg).  LABS: CBC:    Component Value Date/Time   WBC 5.9 11/29/2023 1004   WBC 8.3 10/25/2023 1047   HGB 8.9 (L) 11/29/2023 1004   HGB 11.9 (L) 05/27/2014 2215   HCT 28.9 (L) 11/29/2023 1004   HCT 36.4 05/27/2014 2215   PLT 276 11/29/2023 1004   PLT 293 05/27/2014 2215   MCV 87.6 11/29/2023 1004    MCV 81 05/27/2014 2215   NEUTROABS 2.8 11/29/2023 1004   NEUTROABS 5.4 05/27/2014 2215   LYMPHSABS 2.0 11/29/2023 1004   LYMPHSABS 3.0 05/27/2014 2215   MONOABS 0.7 11/29/2023 1004   MONOABS 0.6 05/27/2014 2215   EOSABS 0.1 11/29/2023 1004   EOSABS 0.0 05/27/2014 2215   BASOSABS 0.0 11/29/2023 1004   BASOSABS 0.0 05/27/2014 2215   Comprehensive Metabolic Panel:    Component Value Date/Time   NA 137 11/29/2023 1004   NA 143 07/08/2016 1259   NA 139 05/27/2014 2215   K 4.0 11/29/2023 1004   K 3.5 05/27/2014 2215   CL 104 11/29/2023 1004   CL 105 05/27/2014 2215   CO2 27 11/29/2023 1004   CO2 26 05/27/2014 2215   BUN 15 11/29/2023 1004   BUN 10 07/08/2016 1259   BUN 14 05/27/2014 2215   CREATININE 0.99 11/29/2023 1004   CREATININE 1.09 (H) 05/27/2014 2215   GLUCOSE 120 (H) 11/29/2023 1004   GLUCOSE 104 (H) 05/27/2014 2215   CALCIUM  8.6 (L) 11/29/2023 1004   CALCIUM  9.0 05/27/2014 2215   AST 24 11/29/2023 1004   ALT 26 11/29/2023 1004   ALT 24 08/05/2013 0832   ALKPHOS 102 11/29/2023 1004   ALKPHOS 97 08/05/2013 0832   BILITOT 0.4 11/29/2023 1004   PROT 5.8 (L) 11/29/2023 1004   PROT 7.0 07/08/2016 1259   PROT 6.8 08/05/2013 0832   ALBUMIN 2.9 (L) 11/29/2023 1004   ALBUMIN 4.0 07/08/2016 1259   ALBUMIN 3.4 08/05/2013 0832    RADIOGRAPHIC STUDIES: No results found.   PERFORMANCE STATUS (ECOG) : 1 - Symptomatic but completely ambulatory  Review of Systems Unless otherwise noted, a complete review of systems is negative.  Physical Exam General: NAD Cardiovascular: regular rate and rhythm Pulmonary: clear ant fields Abdomen: soft, nontender, + bowel sounds GU: no suprapubic  tenderness Extremities: no edema, no joint deformities Skin: no rashes Neurological: Weakness but otherwise nonfocal  IMPRESSION: Patient seen in infusion.    Patient denies significant or diarrhea has resolved.  Oral intake has improved.  Patient describes some financial concerns  and would like to speak with our social worker to explore resources.  Will send referral.  Patient says that she is still working part-time in order to cover needed expenses.  Reviewed MOST form with patient.  She says that she does not think that she would want to live on machines and says if I die, I die.  However, she says that her family would likely want her to be resuscitated.  She agreed to speak with her family more about decision making and then is interested in completing the MOST form in the future.  PLAN: - Continue current scope of treatment - Referral to social work - MOST form reviewed - Follow-up telephone visit 1-2 months   Patient expressed understanding and was in agreement with this plan. She also understands that She can call the clinic at any time with any questions, concerns, or complaints.     Time Total: 15 minutes  Visit consisted of counseling and education dealing with the complex and emotionally intense issues of symptom management and palliative care in the setting of serious and potentially life-threatening illness.Greater than 50%  of this time was spent counseling and coordinating care related to the above assessment and plan.  Signed by: Fonda Mower, PhD, NP-C

## 2023-11-29 NOTE — Assessment & Plan Note (Signed)
 Recommend patient to take calcium 1200mg  daily, otc supply.  Take vitamin D supplementation.

## 2023-11-29 NOTE — Assessment & Plan Note (Signed)
 Left breast cancer, cT4b N3 Mx, ER/PR-, HER2 + S/p neoadjuvant chemotherapy 6 cycles of TCHP  S/p left lumpectomy  + SLNB -ypT0 ypN0, complete pathological response.  S/p adjuvant Transtuzumab and Pertuzumab  11 cycles. S/p adjuvant radiation-  Declined adjuvant Zometa. Recurrent Stage IV breast cancer. 08/27/2023 left breast skin punch biopsy showed ER+ PR + HER2 (3+) breast carcinoma 09/08/2023, right axillary lymph node biopsy showed .  ER- PR-  HER2 (3+)  breast carcinoma  PET scan showed recurrence in bilateral breast and lymphadenopathy. MRI brain negative.   Recommend Fam-trastuzumab  deruxtecan- Enhertu ,  Labs are reviewed and discussed with patient.  She tolerates treatment with some moderate difficulties. Status post 3 cycles of Enhertu .  Hold additional treatment due to possible toxicity. Repeat PET scan

## 2023-11-29 NOTE — Progress Notes (Signed)
 Hematology/Oncology Progress note [phone] [phone]     CHIEF COMPLAINTS/REASON FOR VISIT:  Follow-up for Stage IV breast cancer, ER-,PR- HER2+ and ER-, PR-, HER2-  ASSESSMENT & PLAN:   Cancer Staging  Breast cancer (HCC) Staging form: Breast, AJCC 8th Edition - Clinical stage from 08/21/2021: Stage IIIB (cT4b, cN3c, cM0, G3, ER-, PR-, HER2+) - Signed by [name], MD on 09/12/2021  Breast cancer (HCC) Left breast cancer, cT4b N3 Mx, ER/PR-, HER2 + S/p neoadjuvant chemotherapy 6 cycles of TCHP  S/p left lumpectomy  + SLNB -ypT0 ypN0, complete pathological response.  S/p adjuvant Transtuzumab and Pertuzumab  11 cycles. S/p adjuvant radiation-  Declined adjuvant Zometa. Recurrent Stage IV breast cancer. 08/27/2023 left breast skin punch biopsy showed ER+ PR + HER2 (3+) breast carcinoma 09/08/2023, right axillary lymph node biopsy showed .  ER- PR-  HER2 (3+)  breast carcinoma  PET scan showed recurrence in bilateral breast and lymphadenopathy. MRI brain negative.   Recommend Fam-trastuzumab  deruxtecan- Enhertu ,  Labs are reviewed and discussed with patient.  She tolerates treatment with some moderate difficulties. Status post 3 cycles of Enhertu .  Hold additional treatment due to possible toxicity. Repeat PET scan    Hypocalcemia Recommend patient to take calcium  1200mg  daily, otc supply.  Take vitamin D  supplementation.   Hypomagnesemia Continue Slow Mag 1 tab BID IV magnesium  4 g x 1 today.   Normocytic anemia Hemoglobin is stable. Monitor counts.   Left upper extremity swelling Recommend left upper extremity ultrasound for further evaluation.  Leg swelling Obtain bilateral extremity venous ultrasound to rule out DVT. Check echocardiogram.  Suspect cardiotoxicity.       Orders Placed This Encounter  Procedures   NM PET Image Restag (PS) Skull Base To Thigh    Standing Status:   Future    Expected Date:   12/06/2023    Expiration Date:    11/28/2024    If indicated for the ordered procedure, I authorize the administration of a radiopharmaceutical per Radiology protocol:   Yes    Preferred imaging location?:   [facility]   US  Venous Img Lower Bilateral    Standing Status:   Future    Number of Occurrences:   1    Expected Date:   12/02/2023    Expiration Date:   11/28/2024    Reason for Exam (SYMPTOM  OR DIAGNOSIS REQUIRED):   leg swelling    [facility]erred [facility]   Magnesium     Standing Status:   Future    Number of Occurrences:   1    Expected Date:   11/29/2023    Expiration Date:   02/27/2024   Follow up 3 weeks  We spent sufficient time to discuss many aspect of care, questions were answered to patient's satisfaction.  Call Babara, MD, PhD [facility] Hematology Oncology 11/29/2023      HISTORY OF PRESENTING ILLNESS:  Vanessa Fisher is a  62 y.o.  female presents for follow up of Stage IV metastatic breast cancer.  SUMMARY OF ONCOLOGIC HISTORY: Oncology History  Breast cancer (HCC)  07/31/2021 Imaging   Bilateral diagnostic mammogram and US  showed 5 centimeter LEFT breast mass associated with pleomorphic calcifications is suspicious for invasive ductal carcinoma.At least 4 LEFT axillary lymph nodes with abnormal morphology   08/21/2021 Cancer Staging   Staging form: Breast, AJCC 8th Edition - Clinical: Stage IIIB (cT4b, cN3c, cM0, G3, ER-, PR-, HER2+) - Signed by [name], MD on 08/28/2021 Histologic  grading system: 3 grade system  -08/21/21 left breast ultrasound-guided biopsy showed invasive mammary carcinoma, grade 3, ER/PR negative, HER2 positive.  Left axillary lymph node biopsy positive for macro metastatic mammary carcinoma, 8 mm in greatest extent.   08/30/2021 Imaging   MRI brain w wo contrast -No metastatic disease or acute intracranial abnormality. Essentially normal for age MRI appearance of the brain.    09/03/2021 Echocardiogram   1. Left ventricular  ejection fraction, by estimation, is 55 to 60%. Left ventricular ejection fraction by 3D volume is 58 %. The left ventricle has normal function. The left ventricle has no regional wall motion abnormalities. There is mild left  ventricular hypertrophy. Left ventricular diastolic parameters were normal.  2. Right ventricular systolic function is normal. The right ventricular size is normal.  3. The mitral valve is normal in structure. No evidence of mitral valve regurgitation.  4. The aortic valve was not well visualized. Aortic valve regurgitation is not visualized   09/10/2021 Imaging   PET scan showed Large hypermetabolic left breast mass and diffuse skin thickening,consistent with primary breast carcinoma. Hypermetabolic lymphadenopathy in the left axilla, left subpectoral region, and left supraclavicular region, consistent with metastatic disease. No evidence of metastatic disease within the abdomen or pelvis   09/12/2021 - 02/13/2022 Chemotherapy   Patient is on Treatment Plan : BREAST  Docetaxel  + Carboplatin  + Trastuzumab  + Pertuzumab   (TCHP) q21d       Genetic Testing   Negative genetic testing. No pathogenic variants identified on the [facility] Common Hereditary Cancers+RNA panel. The report date is 10/26/2021.  The Common Hereditary Cancers Panel + RNA offered by [facility] includes sequencing and/or deletion duplication testing of the following 47 genes: APC, ATM, AXIN2, BARD1, BMPR1A, BRCA1, BRCA2, BRIP1, CDH1, CDKN2A (p14ARF), CDKN2A (p16INK4a), CKD4, CHEK2, CTNNA1, DICER1, EPCAM (Deletion/duplication testing only), GREM1 (promoter region deletion/duplication testing only), KIT, MEN1, MLH1, MSH2, MSH3, MSH6, MUTYH, NBN, NF1, NHTL1, PALB2, PDGFRA, PMS2, POLD1, POLE, PTEN, RAD50, RAD51C, RAD51D, SDHB, SDHC, SDHD, SMAD4, SMARCA4. STK11, TP53, TSC1, TSC2, and VHL.  The following genes were evaluated for sequence changes only: SDHA and HOXB13 c.251G>A variant only.   02/09/2022 Echocardiogram   LVEF  55 to 60%    03/27/2022 Surgery   S/p left mastectomy + SLNB ypT0 ypN0,    04/24/2022 - 12/23/2022 Chemotherapy   Patient is on Treatment Plan : BREAST Trastuzumab   + Pertuzumab  q21d x 13 cycles     05/06/2022 Echocardiogram   LVEF 55 to 60%    07/02/2022 - 08/03/2022 Radiation Therapy   Adjuvant breast radiation.    10/20/2022 Imaging   Bone scan whole body showed 1. Degenerative uptake at the sternoclavicular joints, shoulders,knees and hindfoot bilaterally. 2. No evidence for metastatic disease to the. 3. Uptake in the left breast may be related to postsurgical change or residual tumor.   07/28/2023 Mammogram   Unilateral left breast mammogram showed  1. Diffuse MEDIAL LEFT breast erythema and pain without interval change in mammographic appearance or suspicious sonographic abnormality. Stable skin thickening in this area is presumably due to prior radiation therapy. Findings are most suspicious for cellulitis. Recommend treatment with antibiotics. If antibiotics fail to resolve the erythema, surgical or dermatologic consultation for skin punch biopsy is recommended to exclude inflammatory breast cancer.   2. Redemonstrated LEFT breast seroma at the lumpectomy site, now with complex internal debris. Patient requests aspiration of the fluid component of this collection, which will be attempted under ultrasound guidance.   08/27/2023 Relapse/Recurrence   08/27/2023,  left breast 11:00 punch biopsy showed 1. Breast, biopsy, left 11:00 punch BX :       - INVASIVE GRADE 3 MAMMARY CARCINOMA  INVADING SKIN AND EPIDERMIS WITH       ULCERATION  ER 70% positive, PR 0%, HER2 positive(3+).    09/08/2023 right axilla lymph node biopsy showed 1. Lymph node, needle/core biopsy, right axilla, hydromark coil clip :       ONE LYMPH NODE, POSITIVE FOR METASTATIC CARCINOMA MORPHOLOGICALLY COMPATIBLE       WITH BREAST PRIMARY (1/1).       METASTATIC FOCUS: 11 MM / 1.1 CM.       EXTRANODAL  EXTENSION IDENTIFIE    09/01/2023 Imaging   MRI breast bilaterally without contrast showed  1. Widespread abnormal enhancement involving the skin and nipple of the LEFT breast, involving all 4 quadrants and compatible with known, biopsy proven LEFT breast cancer invading the skin. There is likely slight RETROAREOLAR extension of the nipple involvement extending 8 mm posteriorly from the nipple.   2. Post treatment changes of the LEFT breast without other evidence of recurrent malignancy in the LEFT breast. Thin peripheral enhancement at the lumpectomy site is nonspecific but favored to be due to recent instrumentation.   3. Markedly enlarged RIGHT axillary level 1 lymph nodes, for which second-look ultrasound and ultrasound-guided biopsy is recommended. Small but asymmetric RIGHT axillary level 2 lymph nodes are also suspicious for malignancy but not amenable to percutaneous sampling.   4. Indeterminate RIGHT breast focus at the 9 o'clock periareolar position anterior depth measuring 3 mm and indeterminate RIGHT breast mass at the 9 o'clock central position at middle depth measuring 5 mm. If management would be impacted, MRI guided biopsies of the focus and mass at 9 o'clock in the RIGHT breast is recommended.   5. Confluent enhancement along the LEFT internal mammary vessels which could reflect ectatic vascular structures or an enlarged LEFT internal mammary lymph node. Recommend attention on forthcoming PET-CT.     09/10/2023 - 09/10/2023 Chemotherapy   Patient is on Treatment Plan : BREAST Fam-Trastuzumab Deruxtecan-nxki  (Enhertu ) (5.4) q21d     09/10/2023 Imaging   PET scan showed  1. Significant hypermetabolism involving skin thickening of the left breast laterally worrisome for recurrent breast cancer. 2. Extensive hypermetabolic right axillary and subpectoral lymphadenopathy consistent with metastatic nodal disease. 3. Hypermetabolic left internal mammary lymphadenopathy. 4.  No findings for abdominal/pelvic metastatic disease or osseous metastatic disease. 5. Large fluid collection in the left breast containing gas suggesting a postoperative fluid collection or abscess. 6. Benign hypermetabolic/brown fat in the neck and chest area.   09/27/2023 -  Chemotherapy   Patient is on Treatment Plan : BREAST Fam-Trastuzumab Deruxtecan-nxki  (Enhertu ) (5.4) q21d      + chronic nasal pressure, congestion, horse voice she attributes to sinusitis.   INTERVAL HISTORY Vanessa Fisher is a 63 y.o. female who has above history reviewed by me today presents for follow up visit for recurrent HER2 positive breast cancer  -  left breast persistent swelling with drainage in the site of previous biopsy, drainage has improved. Left breast pain has resolved.  She has been experiencing swelling of bilateral lower extremity  She also has left upper extremity swelling and has ultrasound workup pending.  She has a history of diarrhea, which was a significant issue at her last visit but has resolved over the past four days. She is currently taking Toprol and losartan, prescribed by cardiologist for cardio-protective effects.  MEDICAL HISTORY:  Past Medical History:  Diagnosis Date   Anemia    Anxiety    Arthritis    Asthma    WELL CONTROLLED   Bile acid malabsorption syndrome    Bradycardia    HAD AN ISSUE WITH THIS IN 2016-NO PROBLEMS SINCE   Breast cancer, left breast (HCC) 08/2021   Carpal tunnel syndrome on right    Depression    Diabetes mellitus without complication (HCC)    Diverticulitis    Gastric reflux    GERD (gastroesophageal reflux disease)    Hand pain 05/12/2016   Headache    H/O MIGRAINES   History of kidney stones    H/O   Lumbar radiculitis    Neck pain, chronic    Osteoarthritis of both knees    Panic attacks    Personal history of chemotherapy    Personal history of radiation therapy     SURGICAL HISTORY: Past Surgical History:  Procedure  Laterality Date   BREAST BIOPSY Left 08/21/2021   Axilla Bx, Hydromarker, neg   BREAST BIOPSY Left 08/21/2021   US  Bx, Ribbon Clip, invasive mammary carcinoma   BREAST BIOPSY Left 03/12/2022   US  LT RADIO FREQUENCY TAG LOC US  GUIDE 03/12/2022 [facility]   BREAST BIOPSY Left 03/12/2022   US  LT RADIO FREQUENCY TAG EA ADD LESION LOC US  GUIDE 03/12/2022 [facility]   BREAST LUMPECTOMY Left 03/27/2022   CARPAL TUNNEL RELEASE Right 12/06/2017   Procedure: CARPAL TUNNEL RELEASE;  Surgeon: Cleotilde Barrio, MD;  Location: [facility] OR[facility];  Service: Orthopedics;  Laterality: Right;   COLONOSCOPY WITH PROPOFOL  N/A 06/11/2021   Procedure: COLONOSCOPY WITH PROPOFOL ;  Surgeon: Unk Corinn Skiff, MD;  Location: [facility] END[facility]COPY;  Service: Gastroenterology;  Laterality: N/A;   DORSAL COMPARTMENT RELEASE Right 12/06/2017   Procedure: RELEASE DORSAL COMPARTMENT (DEQUERVAIN);  Surgeon: Cleotilde Barrio, MD;  Location: [facility];  Service: Orthopedics;  Laterality: Right;   ESOPHAGOGASTRODUODENOSCOPY (EGD) WITH PROPOFOL  N/A 06/11/2021   Procedure: ESOPHAGOGASTRODUODENOSCOPY (EGD) WITH PROPOFOL ;  Surgeon: Unk Corinn Skiff, MD;  Location: [facility] ENDOSCOPY;  Service: Gastroenterology;  Laterality: N/A;   INCISION AND DRAINAGE, ABSCESS, BREAST Left 08/27/2023   Procedure: INCISION AND DRAINAGE, ABSCESS, BREAST;  Surgeon: Rodolph Romano, MD;  Location: [facility];  Service: General;  Laterality: Left;   PART MASTECTOMY,RADIO FREQUENCY LOCALIZER,AXILLARY SENTINEL NODE BIOPSY Left 03/27/2022   Procedure: PART MASTECTOMY,RADIO FREQUENCY LOCALIZER,AXILLARY SENTINEL NODE BIOPSY;  Surgeon: Rodolph Romano, MD;  Location: [facility];  Service: General;  Laterality: Left;   PARTIAL HYSTERECTOMY     PORTACATH PLACEMENT N/A 09/24/2021   Procedure: INSERTION PORT-A-CATH;  Surgeon: Rodolph Romano, MD;  Location: [facility];  Service: General;  Laterality: N/A;   TUBAL LIGATION      SOCIAL HISTORY: Social History    Socioeconomic History   Marital status: Single    Spouse name: Not on file   Number of children: Not on file   Years of education: Not on file   Highest education level: Not on file  Occupational History   Not on file  Tobacco Use   Smoking status: Former    Types: Cigarettes   Smokeless tobacco: Never  Vaping Use   Vaping status: Never Used  Substance and Sexual Activity   Alcohol use: No   Drug use: No   Sexual activity: Not on file  Other Topics Concern   Not on file  Social History Narrative   Lives alone   Social Drivers of Health   Financial Resource Strain: Low Risk  (  10/20/2023)   Received from [facility]   Overall Financial Resource Strain (CARDIA)    Difficulty of Paying Living Expenses: Not hard at all  Food Insecurity: Food Insecurity Present (10/20/2023)   Received from [facility]   Hunger Vital Sign    Within the past 12 months, you worried that your food would run out before you got the money to buy more.: Never true    Within the past 12 months, the food you bought just didn't last and you didn't have money to get more.: Sometimes true  Transportation Needs: No Transportation Needs (10/20/2023)   Received [facility]    In the past 12 months, has lack of transportation kept you from medical appointments or from getting medications?: No    Lack of Transportation (Non-Medical): No  Recent Concern: Transportation Needs - Unmet Transportation Needs (10/08/2023)   Received from [facility] - Transportation    In the past 12 months, has lack of transportation kept you from medical appointments or from getting medications?: Yes    Lack of Transportation (Non-Medical): Yes  Physical Activity: Inactive (08/15/2021)   Exercise Vital Sign    Days of Exercise per Week: 0 days    Minutes of Exercise per Session: 0 min  Stress: Stress Concern Present  (08/15/2021)   [employer] of Occupational Health - Occupational Stress Questionnaire    Feeling of Stress : Rather much  Social Connections: Socially Isolated (08/15/2021)   Social Connection and Isolation Panel    Frequency of Communication with Friends and Family: Once a week    Frequency of Social Gatherings with Friends and Family: Once a week    Attends Religious Services: Never    Database Administrator or Organizations: No    Attends Engineer, Structural: Not on file    Marital Status: Never married  Intimate Partner Violence: Not At Risk (08/15/2021)   Humiliation, Afraid, Rape, and Kick questionnaire    Fear of Current or Ex-Partner: No    Emotionally Abused: No    Physically Abused: No    Sexually Abused: No    FAMILY HISTORY: Family History  Problem Relation Age of Onset   Diabetes Mother    Hypertension Mother    Heart failure Mother    Pancreatic cancer Mother 39   Diabetes Father    Hypertension Father    Breast cancer Maternal Aunt    Cervical cancer Maternal Aunt    Lung cancer Son 59    ALLERGIES:  is allergic to bc powder [aspirin-salicylamide-caffeine], doxycycline , gabapentin , hydrocodone -acetaminophen , latex, penicillin g, penicillins, and shellfish allergy.  MEDICATIONS:  Current Outpatient Medications  Medication Sig Dispense Refill   albuterol  (PROVENTIL ) (2.5 MG/3ML) 0.083% nebulizer solution Take 3 mLs (2.5 mg total) by nebulization every 4 (four) hours as needed for wheezing or shortness of breath. 75 mL 2   albuterol  (VENTOLIN  HFA) 108 (90 Base) MCG/ACT inhaler Inhale 2 puffs into the lungs every 6 (six) hours as needed for wheezing or shortness of breath. 18 g 0   allopurinol (ZYLOPRIM) 100 MG tablet Take 100 mg by mouth daily.     atorvastatin (LIPITOR) 10 MG tablet Take 10 mg by mouth daily.     Azelastine  HCl 137 MCG/SPRAY SOLN Place 1 spray into the nose daily. 30 mL 0   calcium  carbonate (OS-CAL - DOSED IN MG OF ELEMENTAL  CALCIUM ) 1250 (500 Ca)  MG tablet Take 1 tablet (1,250 mg total) by mouth daily. 90 tablet 1   celecoxib (CELEBREX) 200 MG capsule Take by mouth 2 (two) times daily.     cetirizine  (ZYRTEC  ALLERGY) 10 MG tablet Take 1 tablet (10 mg total) by mouth daily. 90 tablet 0   cyclobenzaprine  (FLEXERIL ) 10 MG tablet Take 10 mg by mouth 3 (three) times daily.     dexamethasone  (DECADRON ) 4 MG tablet Take 2 tablets (8 mg) by mouth daily for 3 days starting the day after chemotherapy. Take with food. 30 tablet 1   diclofenac Sodium (VOLTAREN) 1 % GEL Apply topically.     diphenoxylate -atropine  (LOMOTIL ) 2.5-0.025 MG tablet Take 1 tablet by mouth 4 (four) times daily as needed for diarrhea or loose stools. 120 tablet 0   docusate sodium  (COLACE) 100 MG capsule Take 1 capsule (100 mg total) by mouth 2 (two) times daily.     DULoxetine (CYMBALTA) 20 MG capsule Take 20 mg by mouth.     escitalopram  (LEXAPRO ) 10 MG tablet Take 1 tablet (10 mg total) by mouth daily. 30 tablet 1   fluconazole  (DIFLUCAN ) 150 MG tablet Take 1 tablet (150 mg total) by mouth once a week. 2 tablet 0   fluticasone  (FLONASE ) 50 MCG/ACT nasal spray Place 2 sprays into both nostrils daily. 15.8 mL 0   lidocaine -prilocaine  (EMLA ) cream Apply 1 Application topically as needed. Apply small amount to port and cover with saran wrap 1-2 hours prior to port access 30 g 12   losartan (COZAAR) 25 MG tablet Take 25 mg by mouth daily.     magic mouthwash (multi-ingredient) oral suspension Swish and swallow 5-10 mLs 4 (four) times daily as needed. 480 mL 3   magic mouthwash w/lidocaine  SOLN Take 5 mLs by mouth 4 (four) times daily as needed for mouth pain. 250 mL 2   magnesium  chloride (SLOW-MAG) 64 MG TBEC SR tablet Take 1 tablet (64 mg total) by mouth 2 (two) times daily. 60 tablet 2   metoprolol succinate (TOPROL-XL) 25 MG 24 hr tablet Take 25 mg by mouth daily.     ondansetron  (ZOFRAN ) 8 MG tablet Take 1 tablet (8 mg total) by mouth every 8 (eight)  hours as needed for nausea or vomiting. Start on the third day after chemotherapy. 30 tablet 1   oxyCODONE  HCl 10 MG TABA Take 10 mg by mouth every 4 (four) hours as needed. 14 tablet 0   pantoprazole (PROTONIX) 20 MG tablet Take 20 mg by mouth daily.     potassium chloride  SA (KLOR-CON  M20) 20 MEQ tablet Take 1 tablet (20 mEq total) by mouth 2 (two) times daily. 60 tablet 1   pregabalin (LYRICA) 50 MG capsule Take 50 mg by mouth 3 (three) times daily.     prochlorperazine  (COMPAZINE ) 10 MG tablet Take 1 tablet (10 mg total) by mouth every 6 (six) hours as needed for nausea or vomiting. 30 tablet 1   naloxone (NARCAN) nasal spray 4 mg/0.1 mL  (Patient not taking: Reported on 11/29/2023)     No current facility-administered medications for this visit.   Facility-Administered Medications Ordered in Other Visits  Medication Dose Route Frequency Provider Last Rate Last Admin   magnesium  sulfate IVPB 2 g 50 mL  2 g Intravenous Once Covington, Sarah M, PA-C        Review of Systems  Constitutional:  Negative for appetite change, chills, fatigue and fever.  HENT:   Negative for hearing loss and voice change.  Eyes:  Negative for eye problems.  Respiratory:  Negative for chest tightness and cough.   Cardiovascular:  Positive for leg swelling. Negative for chest pain.  Gastrointestinal:  Negative for abdominal distention, abdominal pain, blood in stool and diarrhea.  Endocrine: Negative for hot flashes.  Genitourinary:  Negative for difficulty urinating and frequency.   Musculoskeletal:  Positive for arthralgias and back pain.       Leg cramps  Skin:  Negative for itching and rash.  Neurological:  Negative for extremity weakness and headaches.  Hematological:  Negative for adenopathy.  Psychiatric/Behavioral:  Negative for confusion.      PHYSICAL EXAMINATION: ECOG PERFORMANCE STATUS: 1 - Symptomatic but completely ambulatory Vitals:   11/29/23 1027  BP: (!) 116/44  Pulse: 79  Resp: 18   Temp: 97.6 F (36.4 C)   Filed Weights   11/29/23 1027  Weight: 240 lb 1.6 oz (108.9 kg)    Physical Exam Constitutional:      General: She is not in acute distress.    Appearance: She is not diaphoretic.  HENT:     Head: Normocephalic and atraumatic.  Eyes:     General: No scleral icterus. Cardiovascular:     Rate and Rhythm: Normal rate and regular rhythm.     Heart sounds: No murmur heard. Pulmonary:     Effort: Pulmonary effort is normal. No respiratory distress.     Breath sounds: No wheezing.  Abdominal:     General: There is no distension.     Palpations: Abdomen is soft.     Tenderness: There is no abdominal tenderness.  Musculoskeletal:        General: Normal range of motion.     Cervical back: Normal range of motion and neck supple.     Right lower leg: Edema present.     Left lower leg: Edema present.  Skin:    General: Skin is warm and dry.     Findings: No erythema.  Neurological:     Mental Status: She is alert and oriented to person, place, and time.     Cranial Nerves: No cranial nerve deficit.     Motor: No abnormal muscle tone.     Coordination: Coordination normal.  Psychiatric:        Mood and Affect: Affect normal.     Comments:        LABORATORY DATA:  I have reviewed the data as listed    Latest Ref Rng & Units 11/29/2023   10:04 AM 11/25/2023    1:54 PM 11/19/2023   11:47 AM  CBC  WBC 4.0 - 10.5 K/uL 5.9  4.4  4.7   Hemoglobin 12.0 - 15.0 g/dL 8.9  8.2  9.1   Hematocrit 36.0 - 46.0 % 28.9  26.6  29.0   Platelets 150 - 400 K/uL 276  270  183       Latest Ref Rng & Units 11/29/2023   10:04 AM 11/25/2023    1:54 PM 11/19/2023   11:47 AM  CMP  Glucose 70 - 99 mg/dL 879  879  886   BUN 8 - 23 mg/dL 15  12  13    Creatinine 0.44 - 1.00 mg/dL 9.00  9.16  9.00   Sodium 135 - 145 mmol/L 137  137  137   Potassium 3.5 - 5.1 mmol/L 4.0  3.4  3.2   Chloride 98 - 111 mmol/L 104  106  104   CO2 22 - 32 mmol/L 27  25  24   Calcium  8.9  - 10.3 mg/dL 8.6  8.1  8.3   Total Protein 6.5 - 8.1 g/dL 5.8  5.5  5.6   Total Bilirubin 0.0 - 1.2 mg/dL 0.4  0.4  0.4   Alkaline Phos 38 - 126 U/L 102  92  89   AST 15 - 41 U/L 24  25  27    ALT 0 - 44 U/L 26  28  32       RADIOGRAPHIC STUDIES: I have personally reviewed the radiological images as listed and agreed with the findings in the report. No results found.

## 2023-11-29 NOTE — Assessment & Plan Note (Signed)
 Obtain bilateral extremity venous ultrasound to rule out DVT. Check echocardiogram.  Suspect cardiotoxicity.

## 2023-11-30 ENCOUNTER — Telehealth: Payer: Self-pay

## 2023-11-30 LAB — CANCER ANTIGEN 27.29: CA 27.29: 26.6 U/mL (ref 0.0–38.6)

## 2023-11-30 LAB — CANCER ANTIGEN 15-3: CA 15-3: 24.5 U/mL (ref 0.0–25.0)

## 2023-11-30 NOTE — Telephone Encounter (Signed)
 Clinical Social Work was referred by medical provider for assessment of psychosocial needs.  CSW attempted to contact patient by phone.  Left voicemail with contact information and request for return call.

## 2023-12-01 ENCOUNTER — Ambulatory Visit

## 2023-12-03 ENCOUNTER — Encounter: Payer: Self-pay | Admitting: Oncology

## 2023-12-03 ENCOUNTER — Telehealth: Payer: Self-pay

## 2023-12-03 ENCOUNTER — Inpatient Hospital Stay

## 2023-12-03 NOTE — Telephone Encounter (Signed)
 Pt was returning a missed phone call from the social worker. Pt would like a call back. Thank you

## 2023-12-03 NOTE — Progress Notes (Signed)
 [facility] CSW Progress Note  Clinical Child Psychotherapist returned patient's call to follow-up on financial concerns.    Interventions: Provided patient with information about the [place].  Patient stated she needed confirmation of her cancer to receive assistance with her utilities.  CSW and patient to meet next week.  Patient also requested information about her recurring breast cancer.  CSW to contact nurse navigator to obtain.       Follow Up Plan:  CSW will see patient on 11/3 at 11:30.    Macario CHRISTELLA Au, LCSW Clinical Social Worker [facility]    Patient is participating in a Managed Medicaid Plan:  Yes

## 2023-12-06 ENCOUNTER — Ambulatory Visit: Admission: RE | Admit: 2023-12-06 | Source: Ambulatory Visit

## 2023-12-06 ENCOUNTER — Telehealth: Payer: Self-pay

## 2023-12-06 ENCOUNTER — Inpatient Hospital Stay

## 2023-12-06 NOTE — Telephone Encounter (Signed)
 Spoke to pt and informed her why the scans were being obtained. Informed her that once resutls are back, she will be contacted for further follow up. Pt verbalized understanding and states that she will get scans done but did not want anymore. Informed her that she can discuss with Dr. Babara once she meets with her.

## 2023-12-06 NOTE — Progress Notes (Signed)
 [facility] CSW Progress Note  Clinical Child Psychotherapist phoned patient and left a message on her voicemail.  Patient requested to meet with CSW today at 11:30 after her PET scan, but did not arrive.    Macario CHRISTELLA Au, LCSW Clinical Social Worker [facility]

## 2023-12-06 NOTE — Telephone Encounter (Signed)
 Dr. Babara reviewed the plan for PET scan and echocardiogram with pt at the visit. An ultrasound was done for leg swelling, which showed no blood clots. Because the swelling may indicate cardiotoxicity, she ordered a PET scan to check if the treatment is working and an echocardiogram to evaluate heart function. Treatment is currently on hold, waiting on scan results.

## 2023-12-06 NOTE — Telephone Encounter (Signed)
 Patient called upset that she was scheduled for PET scan today but was not aware of scheduling.  Doesn't want anything scheduled prior to her accepting appt date/time.  Is concerned about delay of treatment causing her to become sick especially over the holidays with clinic closures.

## 2023-12-07 ENCOUNTER — Inpatient Hospital Stay: Attending: Oncology

## 2023-12-07 DIAGNOSIS — C50212 Malignant neoplasm of upper-inner quadrant of left female breast: Secondary | ICD-10-CM | POA: Insufficient documentation

## 2023-12-07 DIAGNOSIS — Z5112 Encounter for antineoplastic immunotherapy: Secondary | ICD-10-CM | POA: Insufficient documentation

## 2023-12-07 DIAGNOSIS — Z170 Estrogen receptor positive status [ER+]: Secondary | ICD-10-CM | POA: Insufficient documentation

## 2023-12-07 NOTE — Progress Notes (Signed)
 [facility] CSW Progress Note  Clinical Child Psychotherapist received call from patient regarding appointment yesterday.  She stated she was unable to meet due to not feeling well.      Interventions: Provided her with the [facility] fax number per her request.  CSW is awaiting the grant document to sign and fax back.      Follow Up Plan:  CSW will follow-up with patient by phone     Macario CHRISTELLA Au, LCSW Clinical Social Worker [facility]    Patient is participating in a Managed Medicaid Plan:  Yes

## 2023-12-10 ENCOUNTER — Ambulatory Visit
Admission: RE | Admit: 2023-12-10 | Discharge: 2023-12-10 | Disposition: A | Discharge status: Home / Self Care | Source: Ambulatory Visit | Attending: Oncology | Admitting: Oncology

## 2023-12-10 DIAGNOSIS — M7989 Other specified soft tissue disorders: Secondary | ICD-10-CM | POA: Diagnosis not present

## 2023-12-10 DIAGNOSIS — C50911 Malignant neoplasm of unspecified site of right female breast: Secondary | ICD-10-CM | POA: Insufficient documentation

## 2023-12-10 LAB — ECHOCARDIOGRAM COMPLETE
Area-P 1/2: 4.15 cm2
S' Lateral: 2 cm

## 2023-12-10 NOTE — Progress Notes (Signed)
  Echocardiogram 2D Echocardiogram has been performed.  Vanessa Fisher 12/10/2023, 2:44 PM

## 2023-12-13 ENCOUNTER — Other Ambulatory Visit: Payer: Self-pay | Admitting: Oncology

## 2023-12-13 ENCOUNTER — Ambulatory Visit
Admission: RE | Admit: 2023-12-13 | Discharge: 2023-12-13 | Disposition: A | Discharge status: Home / Self Care | Source: Ambulatory Visit | Attending: Oncology | Admitting: Oncology

## 2023-12-13 ENCOUNTER — Inpatient Hospital Stay

## 2023-12-13 ENCOUNTER — Ambulatory Visit: Payer: Self-pay | Admitting: Oncology

## 2023-12-13 DIAGNOSIS — C50912 Malignant neoplasm of unspecified site of left female breast: Secondary | ICD-10-CM | POA: Diagnosis present

## 2023-12-13 DIAGNOSIS — R590 Localized enlarged lymph nodes: Secondary | ICD-10-CM | POA: Diagnosis not present

## 2023-12-13 DIAGNOSIS — C50812 Malignant neoplasm of overlapping sites of left female breast: Secondary | ICD-10-CM

## 2023-12-13 DIAGNOSIS — C50911 Malignant neoplasm of unspecified site of right female breast: Secondary | ICD-10-CM

## 2023-12-13 LAB — GLUCOSE, CAPILLARY: Glucose-Capillary: 106 mg/dL — ABNORMAL HIGH (ref 70–99)

## 2023-12-13 MED ORDER — FLUDEOXYGLUCOSE F - 18 (FDG) INJECTION
12.8200 | Freq: Once | INTRAVENOUS | Status: AC | PRN
  Administered 2023-12-13: 12.82 via INTRAVENOUS

## 2023-12-13 NOTE — Progress Notes (Signed)
 [facility] CSW Progress Note  Visual Merchandiser met with patient to follow-up on need for community resources.  Patient stated she has a large outstanding utility bill she is trying to get help with.  Discussed required documentation.  Also verified patient's new phone number and updated chart.    Interventions: Filled out required paperwork to assist patient with her utility bill.      Follow Up Plan:  Patient will contact CSW with any support or resource needs    Macario CHRISTELLA Au, LCSW Clinical Social Worker [facility]    Patient is participating in a Managed Medicaid Plan:  Yes

## 2023-12-14 ENCOUNTER — Other Ambulatory Visit: Payer: Self-pay | Admitting: Oncology

## 2023-12-14 NOTE — Telephone Encounter (Signed)
-----   Message from Zelphia Cap sent at 12/13/2023  7:31 PM EST ----- Please let patient know that her echocardiogram showed good heart function.  PET scan still pending.  Please arrange patient to follow-up next week with me to resume [facility]  treatments.  I have  adjusted the chemotherapy ibrance.  Thank you ----- Message ----- From: [employer], [employer] Sent: 12/10/2023   5:06 PM EST To: Zelphia Cap, MD

## 2023-12-14 NOTE — Telephone Encounter (Signed)
 Spoke to pt and relayed results. Follow up has been scheduled to resume treatment on 11/18 @ 1pm. Pt aware of appt details

## 2023-12-16 ENCOUNTER — Ambulatory Visit: Payer: Self-pay | Admitting: Oncology

## 2023-12-16 ENCOUNTER — Inpatient Hospital Stay

## 2023-12-16 NOTE — Progress Notes (Signed)
 [facility] CSW Progress Note  Clinical Child Psychotherapist contacted patient by phone to follow-up on need for community resources.    Interventions: Informed patient that Dr. Babara signed medical form verifying her cancer.  The form was securely emailed to the [place] [employer] [place] per patient's request.  CSW received confirmation that they received the email.      Follow Up Plan:  Patient will contact CSW with any support or resource needs    Macario CHRISTELLA Au, LCSW Clinical Social Worker [facility]

## 2023-12-16 NOTE — Progress Notes (Signed)
 Pt notified of PET scan results

## 2023-12-20 ENCOUNTER — Ambulatory Visit

## 2023-12-20 ENCOUNTER — Other Ambulatory Visit

## 2023-12-20 ENCOUNTER — Inpatient Hospital Stay: Admitting: Oncology

## 2023-12-20 ENCOUNTER — Inpatient Hospital Stay

## 2023-12-20 ENCOUNTER — Ambulatory Visit: Admitting: Oncology

## 2023-12-20 MED FILL — Fosaprepitant Dimeglumine For IV Infusion 150 MG (Base Eq): INTRAVENOUS | Qty: 5 | Status: AC

## 2023-12-21 ENCOUNTER — Inpatient Hospital Stay

## 2023-12-21 ENCOUNTER — Inpatient Hospital Stay (HOSPITAL_BASED_OUTPATIENT_CLINIC_OR_DEPARTMENT_OTHER): Admitting: Oncology

## 2023-12-21 ENCOUNTER — Encounter: Payer: Self-pay | Admitting: Oncology

## 2023-12-21 VITALS — BP 129/61 | HR 75

## 2023-12-21 VITALS — BP 127/61 | HR 76 | Temp 97.6°F | Resp 20 | Wt 237.0 lb

## 2023-12-21 DIAGNOSIS — Z5111 Encounter for antineoplastic chemotherapy: Secondary | ICD-10-CM | POA: Diagnosis not present

## 2023-12-21 DIAGNOSIS — C50212 Malignant neoplasm of upper-inner quadrant of left female breast: Secondary | ICD-10-CM | POA: Diagnosis present

## 2023-12-21 DIAGNOSIS — Z171 Estrogen receptor negative status [ER-]: Secondary | ICD-10-CM

## 2023-12-21 DIAGNOSIS — C50812 Malignant neoplasm of overlapping sites of left female breast: Secondary | ICD-10-CM

## 2023-12-21 DIAGNOSIS — Z5112 Encounter for antineoplastic immunotherapy: Secondary | ICD-10-CM | POA: Diagnosis present

## 2023-12-21 DIAGNOSIS — Z170 Estrogen receptor positive status [ER+]: Secondary | ICD-10-CM | POA: Diagnosis not present

## 2023-12-21 LAB — CBC WITH DIFFERENTIAL (CANCER CENTER ONLY)
Abs Immature Granulocytes: 0.03 K/uL (ref 0.00–0.07)
Basophils Absolute: 0 K/uL (ref 0.0–0.1)
Basophils Relative: 1 %
Eosinophils Absolute: 0.1 K/uL (ref 0.0–0.5)
Eosinophils Relative: 1 %
HCT: 30.8 % — ABNORMAL LOW (ref 36.0–46.0)
Hemoglobin: 9.6 g/dL — ABNORMAL LOW (ref 12.0–15.0)
Immature Granulocytes: 0 %
Lymphocytes Relative: 27 %
Lymphs Abs: 2.1 K/uL (ref 0.7–4.0)
MCH: 27.1 pg (ref 26.0–34.0)
MCHC: 31.2 g/dL (ref 30.0–36.0)
MCV: 87 fL (ref 80.0–100.0)
Monocytes Absolute: 0.6 K/uL (ref 0.1–1.0)
Monocytes Relative: 8 %
Neutro Abs: 4.9 K/uL (ref 1.7–7.7)
Neutrophils Relative %: 63 %
Platelet Count: 289 K/uL (ref 150–400)
RBC: 3.54 MIL/uL — ABNORMAL LOW (ref 3.87–5.11)
RDW: 18.2 % — ABNORMAL HIGH (ref 11.5–15.5)
WBC Count: 7.9 K/uL (ref 4.0–10.5)
nRBC: 0 % (ref 0.0–0.2)

## 2023-12-21 LAB — CMP (CANCER CENTER ONLY)
ALT: 17 U/L (ref 0–44)
AST: 20 U/L (ref 15–41)
Albumin: 3.3 g/dL — ABNORMAL LOW (ref 3.5–5.0)
Alkaline Phosphatase: 112 U/L (ref 38–126)
Anion gap: 10 (ref 5–15)
BUN: 16 mg/dL (ref 8–23)
CO2: 23 mmol/L (ref 22–32)
Calcium: 8.7 mg/dL — ABNORMAL LOW (ref 8.9–10.3)
Chloride: 102 mmol/L (ref 98–111)
Creatinine: 0.97 mg/dL (ref 0.44–1.00)
GFR, Estimated: >60 mL/min (ref >60)
Glucose, Bld: 118 mg/dL — ABNORMAL HIGH (ref 70–99)
Potassium: 4 mmol/L (ref 3.5–5.1)
Sodium: 135 mmol/L (ref 135–145)
Total Bilirubin: 0.4 mg/dL (ref 0.0–1.2)
Total Protein: 7.1 g/dL (ref 6.5–8.1)

## 2023-12-21 MED ORDER — PALONOSETRON HCL INJECTION 0.25 MG/5ML
0.2500 mg | Freq: Once | INTRAVENOUS | Status: AC
  Administered 2023-12-21: 0.25 mg via INTRAVENOUS
  Filled 2023-12-21: qty 5

## 2023-12-21 MED ORDER — SODIUM CHLORIDE 0.9 % IV SOLN
150.0000 mg | Freq: Once | INTRAVENOUS | Status: AC
  Administered 2023-12-21: 150 mg via INTRAVENOUS
  Filled 2023-12-21: qty 5
  Filled 2023-12-21: qty 150

## 2023-12-21 MED ORDER — FAM-TRASTUZUMAB DERUXTECAN-NXKI CHEMO 100 MG IV SOLR
5.4000 mg/kg | Freq: Once | INTRAVENOUS | Status: AC
  Administered 2023-12-21: 600 mg via INTRAVENOUS
  Filled 2023-12-21: qty 30

## 2023-12-21 MED ORDER — ACETAMINOPHEN 325 MG PO TABS
650.0000 mg | ORAL_TABLET | Freq: Once | ORAL | Status: AC
  Administered 2023-12-21: 650 mg via ORAL
  Filled 2023-12-21: qty 2

## 2023-12-21 MED ORDER — DIPHENHYDRAMINE HCL 25 MG PO CAPS
50.0000 mg | ORAL_CAPSULE | Freq: Once | ORAL | Status: AC
  Administered 2023-12-21: 50 mg via ORAL
  Filled 2023-12-21: qty 2

## 2023-12-21 MED ORDER — DEXAMETHASONE SOD PHOSPHATE PF 10 MG/ML IJ SOLN
10.0000 mg | Freq: Once | INTRAMUSCULAR | Status: AC
  Administered 2023-12-21: 10 mg via INTRAVENOUS

## 2023-12-21 MED ORDER — DEXTROSE 5 % IV SOLN
INTRAVENOUS | Status: DC
  Filled 2023-12-21: qty 250

## 2023-12-21 NOTE — Progress Notes (Signed)
 Hematology/Oncology Progress note [phone] [phone]     CHIEF COMPLAINTS/REASON FOR VISIT:  Follow-up for Stage IV breast cancer, ER-,PR- HER2+ and ER-, PR-, HER2-  ASSESSMENT & PLAN:   Cancer Staging  Breast cancer (HCC) Staging form: Breast, AJCC 8th Edition - Clinical stage from 08/21/2021: Stage IIIB (cT4b, cN3c, cM0, G3, ER-, PR-, HER2+) - Signed by [name], MD on 09/12/2021  Breast cancer (HCC) Left breast cancer, cT4b N3 Mx, ER/PR-, HER2 + S/p neoadjuvant chemotherapy 6 cycles of TCHP  S/p left lumpectomy  + SLNB -ypT0 ypN0, complete pathological response.  S/p adjuvant Transtuzumab and Pertuzumab  11 cycles. S/p adjuvant radiation-  Declined adjuvant Zometa. Developed recurrent Stage IV breast cancer. 08/27/2023 left breast skin punch biopsy showed ER+ PR + HER2 (3+) breast carcinoma 09/08/2023, right axillary lymph node biopsy showed .  ER- PR-  HER2 (3+)  breast carcinoma   Status post 3 cycles of Enhertu  Interval PET scan November 2025 showed chemo response Labs are reviewed and discussed with patient.  November 2025 echocardiogram stable LVEF Proceed with cycle 4 Enhertu    Encounter for antineoplastic chemotherapy Treatment plan as listed above  Hypocalcemia Recommend patient to take calcium  1200mg  daily, otc supply.  Take vitamin D  supplementation.   Hypomagnesemia Continue Slow Mag 1 tab BID         Orders Placed This Encounter  Procedures   Cancer antigen 27.29    Standing Status:   Future    Expected Date:   01/11/2024    Expiration Date:   01/10/2025   Cancer antigen 15-3    Standing Status:   Future    Expected Date:   01/11/2024    Expiration Date:   01/10/2025   CBC with Differential (Cancer Center Only)    Standing Status:   Future    Expected Date:   01/11/2024    Expiration Date:   01/10/2025   CMP (Cancer Center only)    Standing Status:   Future    Expected Date:   01/11/2024    Expiration Date:   01/10/2025    Cancer antigen 27.29    Standing Status:   Future    Expected Date:   02/01/2024    Expiration Date:   01/31/2025   Cancer antigen 15-3    Standing Status:   Future    Expected Date:   02/01/2024    Expiration Date:   01/31/2025   CBC with Differential (Cancer Center Only)    Standing Status:   Future    Expected Date:   02/01/2024    Expiration Date:   01/31/2025   CMP (Cancer Center only)    Standing Status:   Future    Expected Date:   02/01/2024    Expiration Date:   01/31/2025   Follow up 3 weeks  We spent sufficient time to discuss many aspect of care, questions were answered to patient's satisfaction.  Call Babara, MD, PhD [facility] Hematology Oncology 12/21/2023      HISTORY OF PRESENTING ILLNESS:  Vanessa Fisher is a  63 y.o.  female presents for follow up of Stage IV metastatic breast cancer.  SUMMARY OF ONCOLOGIC HISTORY: Oncology History  Breast cancer (HCC)  07/31/2021 Imaging   Bilateral diagnostic mammogram and US  showed 5 centimeter LEFT breast mass associated with pleomorphic calcifications is suspicious for invasive ductal carcinoma.At least 4 LEFT axillary lymph nodes with abnormal morphology   08/21/2021 Cancer Staging   Staging form: Breast, AJCC 8th Edition -  Clinical: Stage IIIB (cT4b, cN3c, cM0, G3, ER-, PR-, HER2+) - Signed by [name], MD on 08/28/2021 Histologic grading system: 3 grade system  -08/21/21 left breast ultrasound-guided biopsy showed invasive mammary carcinoma, grade 3, ER/PR negative, HER2 positive.  Left axillary lymph node biopsy positive for macro metastatic mammary carcinoma, 8 mm in greatest extent.   08/30/2021 Imaging   MRI brain w wo contrast -No metastatic disease or acute intracranial abnormality. Essentially normal for age MRI appearance of the brain.    09/03/2021 Echocardiogram   1. Left ventricular ejection fraction, by estimation, is 55 to 60%. Left ventricular ejection fraction by 3D volume is 58 %. The left  ventricle has normal function. The left ventricle has no regional wall motion abnormalities. There is mild left  ventricular hypertrophy. Left ventricular diastolic parameters were normal.  2. Right ventricular systolic function is normal. The right ventricular size is normal.  3. The mitral valve is normal in structure. No evidence of mitral valve regurgitation.  4. The aortic valve was not well visualized. Aortic valve regurgitation is not visualized   09/10/2021 Imaging   PET scan showed Large hypermetabolic left breast mass and diffuse skin thickening,consistent with primary breast carcinoma. Hypermetabolic lymphadenopathy in the left axilla, left subpectoral region, and left supraclavicular region, consistent with metastatic disease. No evidence of metastatic disease within the abdomen or pelvis   09/12/2021 - 02/13/2022 Chemotherapy   Patient is on Treatment Plan : BREAST  Docetaxel  + Carboplatin  + Trastuzumab  + Pertuzumab   (TCHP) q21d       Genetic Testing   Negative genetic testing. No pathogenic variants identified on the [facility] Common Hereditary Cancers+RNA panel. The report date is 10/26/2021.  The Common Hereditary Cancers Panel + RNA offered by [facility] includes sequencing and/or deletion duplication testing of the following 47 genes: APC, ATM, AXIN2, BARD1, BMPR1A, BRCA1, BRCA2, BRIP1, CDH1, CDKN2A (p14ARF), CDKN2A (p16INK4a), CKD4, CHEK2, CTNNA1, DICER1, EPCAM (Deletion/duplication testing only), GREM1 (promoter region deletion/duplication testing only), KIT, MEN1, MLH1, MSH2, MSH3, MSH6, MUTYH, NBN, NF1, NHTL1, PALB2, PDGFRA, PMS2, POLD1, POLE, PTEN, RAD50, RAD51C, RAD51D, SDHB, SDHC, SDHD, SMAD4, SMARCA4. STK11, TP53, TSC1, TSC2, and VHL.  The following genes were evaluated for sequence changes only: SDHA and HOXB13 c.251G>A variant only.   02/09/2022 Echocardiogram   LVEF 55 to 60%    03/27/2022 Surgery   S/p left mastectomy + SLNB ypT0 ypN0,    04/24/2022 - 12/23/2022  Chemotherapy   Patient is on Treatment Plan : BREAST Trastuzumab   + Pertuzumab  q21d x 13 cycles     05/06/2022 Echocardiogram   LVEF 55 to 60%    07/02/2022 - 08/03/2022 Radiation Therapy   Adjuvant breast radiation.    10/20/2022 Imaging   Bone scan whole body showed 1. Degenerative uptake at the sternoclavicular joints, shoulders,knees and hindfoot bilaterally. 2. No evidence for metastatic disease to the. 3. Uptake in the left breast may be related to postsurgical change or residual tumor.   07/28/2023 Mammogram   Unilateral left breast mammogram showed  1. Diffuse MEDIAL LEFT breast erythema and pain without interval change in mammographic appearance or suspicious sonographic abnormality. Stable skin thickening in this area is presumably due to prior radiation therapy. Findings are most suspicious for cellulitis. Recommend treatment with antibiotics. If antibiotics fail to resolve the erythema, surgical or dermatologic consultation for skin punch biopsy is recommended to exclude inflammatory breast cancer.   2. Redemonstrated LEFT breast seroma at the lumpectomy site, now with complex internal debris. Patient requests aspiration of the  fluid component of this collection, which will be attempted under ultrasound guidance.   08/27/2023 Relapse/Recurrence   08/27/2023, left breast 11:00 punch biopsy showed 1. Breast, biopsy, left 11:00 punch BX :       - INVASIVE GRADE 3 MAMMARY CARCINOMA  INVADING SKIN AND EPIDERMIS WITH       ULCERATION  ER 70% positive, PR 0%, HER2 positive(3+).    09/08/2023 right axilla lymph node biopsy showed 1. Lymph node, needle/core biopsy, right axilla, hydromark coil clip :       ONE LYMPH NODE, POSITIVE FOR METASTATIC CARCINOMA MORPHOLOGICALLY COMPATIBLE       WITH BREAST PRIMARY (1/1).       METASTATIC FOCUS: 11 MM / 1.1 CM.       EXTRANODAL EXTENSION IDENTIFIE    09/01/2023 Imaging   MRI breast bilaterally without contrast showed  1. Widespread  abnormal enhancement involving the skin and nipple of the LEFT breast, involving all 4 quadrants and compatible with known, biopsy proven LEFT breast cancer invading the skin. There is likely slight RETROAREOLAR extension of the nipple involvement extending 8 mm posteriorly from the nipple.   2. Post treatment changes of the LEFT breast without other evidence of recurrent malignancy in the LEFT breast. Thin peripheral enhancement at the lumpectomy site is nonspecific but favored to be due to recent instrumentation.   3. Markedly enlarged RIGHT axillary level 1 lymph nodes, for which second-look ultrasound and ultrasound-guided biopsy is recommended. Small but asymmetric RIGHT axillary level 2 lymph nodes are also suspicious for malignancy but not amenable to percutaneous sampling.   4. Indeterminate RIGHT breast focus at the 9 o'clock periareolar position anterior depth measuring 3 mm and indeterminate RIGHT breast mass at the 9 o'clock central position at middle depth measuring 5 mm. If management would be impacted, MRI guided biopsies of the focus and mass at 9 o'clock in the RIGHT breast is recommended.   5. Confluent enhancement along the LEFT internal mammary vessels which could reflect ectatic vascular structures or an enlarged LEFT internal mammary lymph node. Recommend attention on forthcoming PET-CT.     09/10/2023 - 09/10/2023 Chemotherapy   Patient is on Treatment Plan : BREAST Fam-Trastuzumab Deruxtecan-nxki  (Enhertu ) (5.4) q21d     09/10/2023 Imaging   PET scan showed  1. Significant hypermetabolism involving skin thickening of the left breast laterally worrisome for recurrent breast cancer. 2. Extensive hypermetabolic right axillary and subpectoral lymphadenopathy consistent with metastatic nodal disease. 3. Hypermetabolic left internal mammary lymphadenopathy. 4. No findings for abdominal/pelvic metastatic disease or osseous metastatic disease. 5. Large fluid collection  in the left breast containing gas suggesting a postoperative fluid collection or abscess. 6. Benign hypermetabolic/brown fat in the neck and chest area.   09/27/2023 -  Chemotherapy   Patient is on Treatment Plan : BREAST Fam-Trastuzumab Deruxtecan-nxki  (Enhertu ) (5.4) q21d      + chronic nasal pressure, congestion, horse voice she attributes to sinusitis.   INTERVAL HISTORY ELA MOFFAT is a 63 y.o. female who has above history reviewed by me today presents for follow up visit for recurrent HER2 positive breast cancer  -  left breast persistent swelling with drainage in the site of previous biopsy, drainage has improved. Left breast pain has resolved, except occasional soreness..  Bilateral lower extremity swelling resolved.  She has a history of diarrhea, resolved. She is currently taking Toprol and losartan, prescribed by cardiologist for cardio-protective effects.   MEDICAL HISTORY:  Past Medical History:  Diagnosis Date  Anemia    Anxiety    Arthritis    Asthma    WELL CONTROLLED   Bile acid malabsorption syndrome    Bradycardia    HAD AN ISSUE WITH THIS IN 2016-NO PROBLEMS SINCE   Breast cancer, left breast (HCC) 08/2021   Carpal tunnel syndrome on right    Depression    Diabetes mellitus without complication (HCC)    Diverticulitis    Gastric reflux    GERD (gastroesophageal reflux disease)    Hand pain 05/12/2016   Headache    H/O MIGRAINES   History of kidney stones    H/O   Lumbar radiculitis    Neck pain, chronic    Osteoarthritis of both knees    Panic attacks    Personal history of chemotherapy    Personal history of radiation therapy     SURGICAL HISTORY: Past Surgical History:  Procedure Laterality Date   BREAST BIOPSY Left 08/21/2021   Axilla Bx, Hydromarker, neg   BREAST BIOPSY Left 08/21/2021   US  Bx, Ribbon Clip, invasive mammary carcinoma   BREAST BIOPSY Left 03/12/2022   US  LT RADIO FREQUENCY TAG LOC US  GUIDE 03/12/2022  [facility]   BREAST BIOPSY Left 03/12/2022   US  LT RADIO FREQUENCY TAG EA ADD LESION LOC US  GUIDE 03/12/2022 [facility]   BREAST LUMPECTOMY Left 03/27/2022   CARPAL TUNNEL RELEASE Right 12/06/2017   Procedure: CARPAL TUNNEL RELEASE;  Surgeon: Cleotilde Barrio, MD;  Location: [facility];  Service: Orthopedics;  Laterality: Right;   COLONOSCOPY WITH PROPOFOL  N/A 06/11/2021   Procedure: COLONOSCOPY WITH PROPOFOL ;  Surgeon: Unk Corinn Skiff, MD;  Location: [facility];  Service: Gastroenterology;  Laterality: N/A;   DORSAL COMPARTMENT RELEASE Right 12/06/2017   Procedure: RELEASE DORSAL COMPARTMENT (DEQUERVAIN);  Surgeon: Cleotilde Barrio, MD;  Location: [facility];  Service: Orthopedics;  Laterality: Right;   ESOPHAGOGASTRODUODENOSCOPY (EGD) WITH PROPOFOL  N/A 06/11/2021   Procedure: ESOPHAGOGASTRODUODENOSCOPY (EGD) WITH PROPOFOL ;  Surgeon: Unk Corinn Skiff, MD;  Location: [facility] ENDOSCOPY;  Service: Gastroenterology;  Laterality: N/A;   INCISION AND DRAINAGE, ABSCESS, BREAST Left 08/27/2023   Procedure: INCISION AND DRAINAGE, ABSCESS, BREAST;  Surgeon: Rodolph Romano, MD;  Location: [facility];  Service: General;  Laterality: Left;   PART MASTECTOMY,RADIO FREQUENCY LOCALIZER,AXILLARY SENTINEL NODE BIOPSY Left 03/27/2022   Procedure: PART MASTECTOMY,RADIO FREQUENCY LOCALIZER,AXILLARY SENTINEL NODE BIOPSY;  Surgeon: Rodolph Romano, MD;  Location: [facility];  Service: General;  Laterality: Left;   PARTIAL HYSTERECTOMY     PORTACATH PLACEMENT N/A 09/24/2021   Procedure: INSERTION PORT-A-CATH;  Surgeon: Rodolph Romano, MD;  Location: [facility];  Service: General;  Laterality: N/A;   TUBAL LIGATION      SOCIAL HISTORY: Social History   Socioeconomic History   Marital status: Single    Spouse name: Not on file   Number of children: Not on file   Years of education: Not on file   Highest education level: Not on file  Occupational History   Not on file  Tobacco Use    Smoking status: Former    Types: Cigarettes   Smokeless tobacco: Never  Vaping Use   Vaping status: Never Used  Substance and Sexual Activity   Alcohol use: No   Drug use: No   Sexual activity: Not on file  Other Topics Concern   Not on file  Social History Narrative   Lives alone   Social Drivers of Health   Financial Resource Strain: Low Risk  (10/20/2023)   Received from [facility]  Overall Financial Resource Strain (CARDIA)    Difficulty of Paying Living Expenses: Not hard at all  Food Insecurity: Food Insecurity Present (10/20/2023)   Received from [facility]   Hunger Vital Sign    Within the past 12 months, you worried that your food would run out before you got the money to buy more.: Never true    Within the past 12 months, the food you bought just didn't last and you didn't have money to get more.: Sometimes true  Transportation Needs: No Transportation Needs (10/20/2023)   Received [facility] - Transportation    In the past 12 months, has lack of transportation kept you from medical appointments or from getting medications?: No    Lack of Transportation (Non-Medical): No  Recent Concern: Transportation Needs - Unmet Transportation Needs (10/08/2023)   Received from [facility] - Transportation    In the past 12 months, has lack of transportation kept you from medical appointments or from getting medications?: Yes    Lack of Transportation (Non-Medical): Yes  Physical Activity: Inactive (08/15/2021)   Exercise Vital Sign    Days of Exercise per Week: 0 days    Minutes of Exercise per Session: 0 min  Stress: Stress Concern Present (08/15/2021)   [employer] of Occupational [employer] - Occupational Stress Questionnaire    Feeling of Stress : Rather much  Social Connections: Socially Isolated (08/15/2021)   Social Connection and Isolation Panel    Frequency of  Communication with Friends and Family: Once a week    Frequency of Social Gatherings with Friends and Family: Once a week    Attends Religious Services: Never    Database Administrator or Organizations: No    Attends Engineer, Structural: Not on file    Marital Status: Never married  Intimate Partner Violence: Not At Risk (08/15/2021)   Humiliation, Afraid, Rape, and Kick questionnaire    Fear of Current or Ex-Partner: No    Emotionally Abused: No    Physically Abused: No    Sexually Abused: No    FAMILY HISTORY: Family History  Problem Relation Age of Onset   Diabetes Mother    Hypertension Mother    Heart failure Mother    Pancreatic cancer Mother 12   Diabetes Father    Hypertension Father    Breast cancer Maternal Aunt    Cervical cancer Maternal Aunt    Lung cancer Son 52    ALLERGIES:  is allergic to bc powder [aspirin-salicylamide-caffeine], doxycycline , gabapentin , hydrocodone -acetaminophen , latex, penicillin g, penicillins, and shellfish allergy.  MEDICATIONS:  Current Outpatient Medications  Medication Sig Dispense Refill   albuterol  (PROVENTIL ) (2.5 MG/3ML) 0.083% nebulizer solution Take 3 mLs (2.5 mg total) by nebulization every 4 (four) hours as needed for wheezing or shortness of breath. 75 mL 2   albuterol  (VENTOLIN  HFA) 108 (90 Base) MCG/ACT inhaler Inhale 2 puffs into the lungs every 6 (six) hours as needed for wheezing or shortness of breath. 18 g 0   allopurinol (ZYLOPRIM) 100 MG tablet Take 100 mg by mouth daily.     atorvastatin (LIPITOR) 10 MG tablet Take 10 mg by mouth daily.     Azelastine  HCl 137 MCG/SPRAY SOLN Place 1 spray into the nose daily. 30 mL 0   calcium  carbonate (OS-CAL - DOSED IN MG OF ELEMENTAL CALCIUM ) 1250 (500 Ca) MG tablet Take 1 tablet (1,250 mg total) by mouth daily. 90  tablet 1   celecoxib (CELEBREX) 200 MG capsule Take by mouth 2 (two) times daily.     cetirizine  (ZYRTEC  ALLERGY) 10 MG tablet Take 1 tablet (10 mg total) by  mouth daily. 90 tablet 0   cyclobenzaprine  (FLEXERIL ) 10 MG tablet Take 10 mg by mouth 3 (three) times daily.     dexamethasone  (DECADRON ) 4 MG tablet Take 2 tablets (8 mg) by mouth daily for 3 days starting the day after chemotherapy. Take with food. 30 tablet 1   diclofenac Sodium (VOLTAREN) 1 % GEL Apply topically.     diphenoxylate -atropine  (LOMOTIL ) 2.5-0.025 MG tablet Take 1 tablet by mouth 4 (four) times daily as needed for diarrhea or loose stools. 120 tablet 0   docusate sodium  (COLACE) 100 MG capsule Take 1 capsule (100 mg total) by mouth 2 (two) times daily.     DULoxetine (CYMBALTA) 20 MG capsule Take 20 mg by mouth.     escitalopram  (LEXAPRO ) 10 MG tablet Take 1 tablet (10 mg total) by mouth daily. 30 tablet 1   fluconazole  (DIFLUCAN ) 150 MG tablet Take 1 tablet (150 mg total) by mouth once a week. 2 tablet 0   fluticasone  (FLONASE ) 50 MCG/ACT nasal spray Place 2 sprays into both nostrils daily. 15.8 mL 0   lidocaine -prilocaine  (EMLA ) cream Apply 1 Application topically as needed. Apply small amount to port and cover with saran wrap 1-2 hours prior to port access 30 g 12   losartan (COZAAR) 25 MG tablet Take 25 mg by mouth daily.     magic mouthwash (multi-ingredient) oral suspension Swish and swallow 5-10 mLs 4 (four) times daily as needed. 480 mL 3   magic mouthwash w/lidocaine  SOLN Take 5 mLs by mouth 4 (four) times daily as needed for mouth pain. 250 mL 2   magnesium  chloride (SLOW-MAG) 64 MG TBEC SR tablet Take 1 tablet (64 mg total) by mouth 2 (two) times daily. 60 tablet 2   metoprolol succinate (TOPROL-XL) 25 MG 24 hr tablet Take 25 mg by mouth daily.     ondansetron  (ZOFRAN ) 8 MG tablet Take 1 tablet (8 mg total) by mouth every 8 (eight) hours as needed for nausea or vomiting. Start on the third day after chemotherapy. 30 tablet 1   oxyCODONE  HCl 10 MG TABA Take 10 mg by mouth every 4 (four) hours as needed. 14 tablet 0   pantoprazole (PROTONIX) 20 MG tablet Take 20 mg by  mouth daily.     potassium chloride  SA (KLOR-CON  M20) 20 MEQ tablet Take 1 tablet (20 mEq total) by mouth 2 (two) times daily. 60 tablet 1   pregabalin (LYRICA) 50 MG capsule Take 50 mg by mouth 3 (three) times daily.     prochlorperazine  (COMPAZINE ) 10 MG tablet Take 1 tablet (10 mg total) by mouth every 6 (six) hours as needed for nausea or vomiting. 30 tablet 1   naloxone (NARCAN) nasal spray 4 mg/0.1 mL  (Patient not taking: Reported on 12/21/2023)     No current facility-administered medications for this visit.   Facility-Administered Medications Ordered in Other Visits  Medication Dose Route Frequency Provider Last Rate Last Admin   dextrose  5 % solution   Intravenous Continuous [name], MD   Stopped at 12/21/23 1603   magnesium  sulfate IVPB 2 g 50 mL  2 g Intravenous Once Covington, Sarah M, PA-C        Review of Systems  Constitutional:  Negative for appetite change, chills, fatigue and fever.  HENT:  Negative for hearing loss and voice change.   Eyes:  Negative for eye problems.  Respiratory:  Negative for chest tightness and cough.   Cardiovascular:  Negative for chest pain and leg swelling.  Gastrointestinal:  Negative for abdominal distention, abdominal pain, blood in stool and diarrhea.  Endocrine: Negative for hot flashes.  Genitourinary:  Negative for difficulty urinating and frequency.   Musculoskeletal:  Positive for arthralgias and back pain.       Leg cramps  Skin:  Negative for itching and rash.  Neurological:  Negative for extremity weakness and headaches.  Hematological:  Negative for adenopathy.  Psychiatric/Behavioral:  Negative for confusion.      PHYSICAL EXAMINATION: ECOG PERFORMANCE STATUS: 1 - Symptomatic but completely ambulatory Vitals:   12/21/23 1313  BP: 127/61  Pulse: 76  Resp: 20  Temp: 97.6 F (36.4 C)  SpO2: 100%   Filed Weights   12/21/23 1313  Weight: 237 lb (107.5 kg)    Physical Exam Constitutional:      General: She is not  in acute distress.    Appearance: She is not diaphoretic.  HENT:     Head: Normocephalic and atraumatic.  Eyes:     General: No scleral icterus. Cardiovascular:     Rate and Rhythm: Normal rate and regular rhythm.     Heart sounds: No murmur heard. Pulmonary:     Effort: Pulmonary effort is normal. No respiratory distress.     Breath sounds: No wheezing.  Abdominal:     General: There is no distension.     Palpations: Abdomen is soft.     Tenderness: There is no abdominal tenderness.  Musculoskeletal:        General: Normal range of motion.     Cervical back: Normal range of motion and neck supple.     Right lower leg: Edema present.     Left lower leg: Edema present.  Skin:    General: Skin is warm and dry.     Findings: No erythema.  Neurological:     Mental Status: She is alert and oriented to person, place, and time.     Cranial Nerves: No cranial nerve deficit.     Motor: No abnormal muscle tone.     Coordination: Coordination normal.  Psychiatric:        Mood and Affect: Affect normal.     Comments:        LABORATORY DATA:  I have reviewed the data as listed    Latest Ref Rng & Units 12/21/2023   12:51 PM 11/29/2023   10:04 AM 11/25/2023    1:54 PM  CBC  WBC 4.0 - 10.5 K/uL 7.9  5.9  4.4   Hemoglobin 12.0 - 15.0 g/dL 9.6  8.9  8.2   Hematocrit 36.0 - 46.0 % 30.8  28.9  26.6   Platelets 150 - 400 K/uL 289  276  270       Latest Ref Rng & Units 12/21/2023   12:51 PM 11/29/2023   10:04 AM 11/25/2023    1:54 PM  CMP  Glucose 70 - 99 mg/dL 881  879  879   BUN 8 - 23 mg/dL 16  15  12    Creatinine 0.44 - 1.00 mg/dL 9.02  9.00  9.16   Sodium 135 - 145 mmol/L 135  137  137   Potassium 3.5 - 5.1 mmol/L 4.0  4.0  3.4   Chloride 98 - 111 mmol/L 102  104  106  CO2 22 - 32 mmol/L 23  27  25    Calcium  8.9 - 10.3 mg/dL 8.7  8.6  8.1   Total Protein 6.5 - 8.1 g/dL 7.1  5.8  5.5   Total Bilirubin 0.0 - 1.2 mg/dL 0.4  0.4  0.4   Alkaline Phos 38 - 126 U/L 112   102  92   AST 15 - 41 U/L 20  24  25    ALT 0 - 44 U/L 17  26  28        RADIOGRAPHIC STUDIES: I have personally reviewed the radiological images as listed and agreed with the findings in the report. NM PET Image Restag (PS) Skull Base To Thigh Result Date: 12/15/2023 EXAM: PET AND CT SKULL BASE TO MID THIGH 12/13/2023 02:38:10 PM TECHNIQUE: RADIOPHARMACEUTICAL: 12.82 mCi F-18 FDG Uptake time 60 minutes. Glucose level 106 mg/dl. Blood pool SUV 2.4. PET imaging was acquired from the base of the skull to the mid thighs. Non-contrast enhanced computed tomography was obtained for attenuation correction and anatomic localization. COMPARISON: 09/10/2023 CLINICAL HISTORY: breast cancer FINDINGS: HEAD AND NECK: No metabolically active cervical lymphadenopathy. CHEST: No metabolically active pulmonary nodules. Previous left internal mammary adenopathy has resolved. Notable reduction in size and activity of right axillary adenopathy without complete resolution. Index node 0.9 cm in short axis on image 44 series 6 with maximum SUV 5.6 previously 1.8 cm with maximum SUV 29.3. Faintly accentuated activity in the vicinity of the left 2nd rib posterolaterally but without CT correlate, I suspect that this probably represents intercostal muscular activity rather than true rib activity. Maximum SUV 4.9. Right port-a-cath tip in the upper SVC. ABDOMEN AND PELVIS: No metabolically active intraperitoneal mass. No metabolically active lymphadenopathy. Physiologic activity within the gastrointestinal and genitourinary systems. Mild sigmoid colon diverticulosis. Small type 1 hiatal hernia. BONES AND SOFT TISSUE: No abnormal FDG activity localizes to the bones. No metabolically active aggressive osseous lesion. Improved cutaneous thickening in the left breast current regional maximum SUV 4.5 and previously 17.0. Interval resolution of gas in the left breast fluid collection the fluid collection currently measures 5.7 x 4.4 cm.  Benign right iliac bone lesion on image 114 series 6 with sclerotic margin and central low density chronically stable back through 07/13/2020. IMPRESSION: 1. Interval resolution of gas in the left breast fluid collection, now measuring 5.7 x 4.4 cm. 2. Improved cutaneous thickening in the left breast, with current regional maximum SUV 4.5, previously 17.0. 3. Notable reduction in size and activity of right axillary adenopathy without complete resolution, with index node 0.9 cm in short axis and maximum SUV 5.6, previously 1.8 cm with maximum SUV 29.3. 4. Previous left internal mammary adenopathy has resolved. 5. Faintly accentuated activity in the vicinity of the left 2nd rib posterolaterally without CT correlate, favored to represent intercostal muscular activity rather than a true rib lesion, maximum SUV 4.9. Surveillance of this region suggested. Electronically signed by: Ryan Salvage MD 12/15/2023 01:01 PM EST RP Workstation: [id number]   ECHOCARDIOGRAM COMPLETE Result Date: 12/10/2023    ECHOCARDIOGRAM REPORT   Patient Name:   KACEY DYSERT Date of Exam: 12/10/2023 Medical Rec #:  [id number]            Height:       66.0 in Accession #:    [id number]           Weight:       240.1 lb Date of Birth:  1960/09/28  BSA:          2.161 m Patient Age:    63 years             BP:           116/44 mmHg Patient Gender: F                    HR:           74 bpm. Exam Location:  Outpatient Procedure: 2D Echo and Strain Analysis (Both Spectral and Color Flow Doppler            were utilized during procedure).                                 MODIFIED REPORT: This report was modified by Cara JONETTA Lovelace MD on 12/10/2023 due to Normal GLS                                      -20.3.  Indications:     leg swelling  History:         Patient has prior history of Echocardiogram examinations, most                  recent 10/15/2022. Breast cancer.  Sonographer:     Tinnie Barefoot RDCS Referring Phys:   [id number] Kendria Halberg Diagnosing Phys: Cara JONETTA Lovelace MD IMPRESSIONS  1. Left ventricular ejection fraction, by estimation, is 60 to 65%. The left ventricle has normal function. The left ventricle has no regional wall motion abnormalities. Left ventricular diastolic parameters were normal. The average left ventricular global longitudinal strain is 20.3 %. The global longitudinal strain is normal.  2. Right ventricular systolic function is normal. The right ventricular size is normal.  3. The mitral valve is normal in structure. No evidence of mitral valve regurgitation.  4. The aortic valve is normal in structure. Aortic valve regurgitation is not visualized. FINDINGS  Left Ventricle: Left ventricular ejection fraction, by estimation, is 60 to 65%. The left ventricle has normal function. The left ventricle has no regional wall motion abnormalities. The average left ventricular global longitudinal strain is 20.3 %. Strain was performed and the global longitudinal strain is normal. The left ventricular internal cavity size was normal in size. There is no left ventricular hypertrophy. Left ventricular diastolic parameters were normal. Right Ventricle: The right ventricular size is normal. No increase in right ventricular wall thickness. Right ventricular systolic function is normal. Left Atrium: Left atrial size was normal in size. Right Atrium: Right atrial size was not well visualized. Pericardium: There is no evidence of pericardial effusion. Mitral Valve: The mitral valve is normal in structure. No evidence of mitral valve regurgitation. Tricuspid Valve: The tricuspid valve is normal in structure. Tricuspid valve regurgitation is not demonstrated. Aortic Valve: The aortic valve is normal in structure. Aortic valve regurgitation is not visualized. Pulmonic Valve: The pulmonic valve was normal in structure. Pulmonic valve regurgitation is not visualized. Aorta: The ascending aorta was not well visualized. IAS/Shunts: No  atrial level shunt detected by color flow Doppler. Additional Comments: 3D was performed not requiring image post processing on an independent workstation and was indeterminate.  LEFT VENTRICLE PLAX 2D LVIDd:         2.90 cm   Diastology LVIDs:  2.00 cm   LV e' medial:    7.72 cm/s LV PW:         0.90 cm   LV E/e' medial:  8.2 LV IVS:        1.10 cm   LV e' lateral:   11.10 cm/s LVOT diam:     1.80 cm   LV E/e' lateral: 5.7 LV SV:         55 LV SV Index:   25        2D Longitudinal Strain LVOT Area:     2.54 cm  2D Strain GLS (A4C):   19.6 % LV IVRT:       88 msec   2D Strain GLS (A3C):   19.7 %                          2D Strain GLS (A2C):   21.4 %                          2D Strain GLS Avg:     20.3 % RIGHT VENTRICLE             IVC RV Basal diam:  2.40 cm     IVC diam: 1.70 cm RV S prime:     14.80 cm/s TAPSE (M-mode): 2.5 cm LEFT ATRIUM             Index        RIGHT ATRIUM           Index LA diam:        3.40 cm 1.57 cm/m   RA Area:     10.50 cm LA Vol (A2C):   50.6 ml 23.41 ml/m  RA Volume:   21.30 ml  9.86 ml/m LA Vol (A4C):   52.8 ml 24.43 ml/m LA Biplane Vol: 52.2 ml 24.15 ml/m  AORTIC VALVE LVOT Vmax:   99.00 cm/s LVOT Vmean:  69.200 cm/s LVOT VTI:    0.216 m  AORTA Ao Root diam: 2.50 cm Ao Asc diam:  2.90 cm MITRAL VALVE MV Area (PHT): 4.15 cm    SHUNTS MV Decel Time: 183 msec    Systemic VTI:  0.22 m MV E velocity: 63.40 cm/s  Systemic Diam: 1.80 cm MV A velocity: 54.50 cm/s MV E/A ratio:  1.16 Dwayne D Callwood MD Electronically signed by Cara JONETTA Lovelace MD Signature Date/Time: 12/10/2023/5:06:48 PM    Final (Updated)    US  Venous Img Lower Bilateral Result Date: 11/30/2023 CLINICAL DATA:  Lower extremity edema. EXAM: BILATERAL LOWER EXTREMITY VENOUS DOPPLER ULTRASOUND TECHNIQUE: Gray-scale sonography with graded compression, as well as color Doppler and duplex ultrasound were performed to evaluate the lower extremity deep venous systems from the level of the common femoral vein and  including the common femoral, femoral, profunda femoral, popliteal and calf veins including the posterior tibial, peroneal and gastrocnemius veins when visible. The superficial great saphenous vein was also interrogated. Spectral Doppler was utilized to evaluate flow at rest and with distal augmentation maneuvers in the common femoral, femoral and popliteal veins. COMPARISON:  None Available. FINDINGS: RIGHT LOWER EXTREMITY Common Femoral Vein: No evidence of thrombus. Normal compressibility, respiratory phasicity and response to augmentation. Saphenofemoral Junction: No evidence of thrombus. Normal compressibility and flow on color Doppler imaging. Profunda Femoral Vein: No evidence of thrombus. Normal compressibility and flow on color Doppler imaging. Femoral Vein: No evidence of thrombus. Normal compressibility, respiratory  phasicity and response to augmentation. Popliteal Vein: No evidence of thrombus. Normal compressibility, respiratory phasicity and response to augmentation. Calf Veins: No evidence of thrombus. Normal compressibility and flow on color Doppler imaging. Superficial Great Saphenous Vein: No evidence of thrombus. Normal compressibility. Venous Reflux:  None. Other Findings: No evidence of superficial thrombophlebitis or abnormal fluid collection. LEFT LOWER EXTREMITY Common Femoral Vein: No evidence of thrombus. Normal compressibility, respiratory phasicity and response to augmentation. Saphenofemoral Junction: No evidence of thrombus. Normal compressibility and flow on color Doppler imaging. Profunda Femoral Vein: No evidence of thrombus. Normal compressibility and flow on color Doppler imaging. Femoral Vein: No evidence of thrombus. Normal compressibility, respiratory phasicity and response to augmentation. Popliteal Vein: No evidence of thrombus. Normal compressibility, respiratory phasicity and response to augmentation. Calf Veins: No evidence of thrombus. Normal compressibility and flow on  color Doppler imaging. Superficial Great Saphenous Vein: No evidence of thrombus. Normal compressibility. Venous Reflux:  None. Other Findings: No evidence of superficial thrombophlebitis or abnormal fluid collection. IMPRESSION: No evidence of deep venous thrombosis in either lower extremity. Electronically Signed   By: Marcey Moan M.D.   On: 11/30/2023 08:17   US  Venous Img Upper Uni Left Result Date: 11/30/2023 CLINICAL DATA:  Swelling for 1 week EXAM: LEFT UPPER EXTREMITY VENOUS DOPPLER ULTRASOUND TECHNIQUE: Gray-scale sonography with graded compression, as well as color Doppler and duplex ultrasound were performed to evaluate the upper extremity deep venous system from the level of the subclavian vein and including the jugular, axillary, basilic, radial, ulnar and upper cephalic vein. Spectral Doppler was utilized to evaluate flow at rest and with distal augmentation maneuvers. COMPARISON:  09/22/2021 FINDINGS: Contralateral Subclavian Vein: Respiratory phasicity is normal and symmetric with the symptomatic side. No evidence of thrombus. Normal compressibility. Internal Jugular Vein: No evidence of thrombus. Normal compressibility, respiratory phasicity and response to augmentation. Subclavian Vein: No evidence of thrombus. Normal compressibility, respiratory phasicity and response to augmentation. Axillary Vein: No evidence of thrombus. Normal compressibility, respiratory phasicity and response to augmentation. Cephalic Vein: No evidence of thrombus. Normal compressibility, respiratory phasicity and response to augmentation. Basilic Vein: No evidence of thrombus. Normal compressibility, respiratory phasicity and response to augmentation. Brachial Veins: No evidence of thrombus. Normal compressibility, respiratory phasicity and response to augmentation. Radial Veins: No evidence of thrombus. Normal compressibility, respiratory phasicity and response to augmentation. Ulnar Veins: No evidence of thrombus.  Normal compressibility, respiratory phasicity and response to augmentation. Other Findings:  None visualized. IMPRESSION: No evidence of DVT within the left upper extremity. Electronically Signed   By: Aliene Lloyd M.D.   On: 11/30/2023 08:14

## 2023-12-21 NOTE — Assessment & Plan Note (Signed)
 Recommend patient to take calcium 1200mg  daily, otc supply.  Take vitamin D supplementation.

## 2023-12-21 NOTE — Assessment & Plan Note (Addendum)
 Left breast cancer, cT4b N3 Mx, ER/PR-, HER2 + S/p neoadjuvant chemotherapy 6 cycles of TCHP  S/p left lumpectomy  + SLNB -ypT0 ypN0, complete pathological response.  S/p adjuvant Transtuzumab and Pertuzumab  11 cycles. S/p adjuvant radiation-  Declined adjuvant Zometa. Developed recurrent Stage IV breast cancer. 08/27/2023 left breast skin punch biopsy showed ER+ PR + HER2 (3+) breast carcinoma 09/08/2023, right axillary lymph node biopsy showed .  ER- PR-  HER2 (3+)  breast carcinoma   Status post 3 cycles of Enhertu  Interval PET scan November 2025 showed chemo response Labs are reviewed and discussed with patient.  November 2025 echocardiogram stable LVEF Proceed with cycle 4 Enhertu 

## 2023-12-21 NOTE — Progress Notes (Signed)
 Patient states she has been having some breast soreness for the last week.

## 2023-12-21 NOTE — Assessment & Plan Note (Signed)
 Continue Slow Mag 1 tab BID

## 2023-12-21 NOTE — Assessment & Plan Note (Signed)
 Treatment plan as listed above.

## 2023-12-22 LAB — CANCER ANTIGEN 15-3: CA 15-3: 22.7 U/mL (ref 0.0–25.0)

## 2023-12-22 LAB — CANCER ANTIGEN 27.29: CA 27.29: 27.1 U/mL (ref 0.0–38.6)

## 2024-01-01 ENCOUNTER — Other Ambulatory Visit: Payer: Self-pay | Admitting: Nurse Practitioner

## 2024-01-01 DIAGNOSIS — E876 Hypokalemia: Secondary | ICD-10-CM

## 2024-01-03 ENCOUNTER — Encounter: Payer: Self-pay | Admitting: Oncology

## 2024-01-10 MED FILL — Fosaprepitant Dimeglumine For IV Infusion 150 MG (Base Eq): INTRAVENOUS | Qty: 5 | Status: AC

## 2024-01-11 ENCOUNTER — Inpatient Hospital Stay: Attending: Oncology | Admitting: Oncology

## 2024-01-11 ENCOUNTER — Encounter: Payer: Self-pay | Admitting: Oncology

## 2024-01-11 ENCOUNTER — Ambulatory Visit: Payer: Self-pay | Admitting: Oncology

## 2024-01-11 ENCOUNTER — Inpatient Hospital Stay

## 2024-01-11 ENCOUNTER — Inpatient Hospital Stay: Attending: Oncology

## 2024-01-11 VITALS — BP 117/89 | HR 73

## 2024-01-11 VITALS — BP 122/55 | HR 85 | Temp 97.1°F | Resp 18 | Wt 236.8 lb

## 2024-01-11 DIAGNOSIS — Z5111 Encounter for antineoplastic chemotherapy: Secondary | ICD-10-CM

## 2024-01-11 DIAGNOSIS — D649 Anemia, unspecified: Secondary | ICD-10-CM | POA: Diagnosis not present

## 2024-01-11 DIAGNOSIS — Z171 Estrogen receptor negative status [ER-]: Secondary | ICD-10-CM

## 2024-01-11 DIAGNOSIS — R300 Dysuria: Secondary | ICD-10-CM | POA: Diagnosis not present

## 2024-01-11 DIAGNOSIS — J329 Chronic sinusitis, unspecified: Secondary | ICD-10-CM | POA: Diagnosis not present

## 2024-01-11 DIAGNOSIS — R350 Frequency of micturition: Secondary | ICD-10-CM | POA: Diagnosis not present

## 2024-01-11 DIAGNOSIS — Z5112 Encounter for antineoplastic immunotherapy: Secondary | ICD-10-CM | POA: Insufficient documentation

## 2024-01-11 DIAGNOSIS — C50212 Malignant neoplasm of upper-inner quadrant of left female breast: Secondary | ICD-10-CM | POA: Diagnosis present

## 2024-01-11 DIAGNOSIS — Z170 Estrogen receptor positive status [ER+]: Secondary | ICD-10-CM | POA: Insufficient documentation

## 2024-01-11 DIAGNOSIS — C50812 Malignant neoplasm of overlapping sites of left female breast: Secondary | ICD-10-CM | POA: Diagnosis not present

## 2024-01-11 LAB — CBC WITH DIFFERENTIAL (CANCER CENTER ONLY)
Abs Immature Granulocytes: 0.11 K/uL — ABNORMAL HIGH (ref 0.00–0.07)
Basophils Absolute: 0.1 K/uL (ref 0.0–0.1)
Basophils Relative: 1 %
Eosinophils Absolute: 0.1 K/uL (ref 0.0–0.5)
Eosinophils Relative: 1 %
HCT: 31.7 % — ABNORMAL LOW (ref 36.0–46.0)
Hemoglobin: 10 g/dL — ABNORMAL LOW (ref 12.0–15.0)
Immature Granulocytes: 2 %
Lymphocytes Relative: 30 %
Lymphs Abs: 2.1 K/uL (ref 0.7–4.0)
MCH: 27.7 pg (ref 26.0–34.0)
MCHC: 31.5 g/dL (ref 30.0–36.0)
MCV: 87.8 fL (ref 80.0–100.0)
Monocytes Absolute: 0.7 K/uL (ref 0.1–1.0)
Monocytes Relative: 10 %
Neutro Abs: 3.9 K/uL (ref 1.7–7.7)
Neutrophils Relative %: 56 %
Platelet Count: 354 K/uL (ref 150–400)
RBC: 3.61 MIL/uL — ABNORMAL LOW (ref 3.87–5.11)
RDW: 17.5 % — ABNORMAL HIGH (ref 11.5–15.5)
WBC Count: 6.9 K/uL (ref 4.0–10.5)
nRBC: 0 % (ref 0.0–0.2)

## 2024-01-11 LAB — CMP (CANCER CENTER ONLY)
ALT: 18 U/L (ref 0–44)
AST: 23 U/L (ref 15–41)
Albumin: 3.6 g/dL (ref 3.5–5.0)
Alkaline Phosphatase: 138 U/L — ABNORMAL HIGH (ref 38–126)
Anion gap: 10 (ref 5–15)
BUN: 9 mg/dL (ref 8–23)
CO2: 26 mmol/L (ref 22–32)
Calcium: 9.4 mg/dL (ref 8.9–10.3)
Chloride: 105 mmol/L (ref 98–111)
Creatinine: 0.89 mg/dL (ref 0.44–1.00)
GFR, Estimated: >60 mL/min (ref >60)
Glucose, Bld: 128 mg/dL — ABNORMAL HIGH (ref 70–99)
Potassium: 4 mmol/L (ref 3.5–5.1)
Sodium: 141 mmol/L (ref 135–145)
Total Bilirubin: <0.2 mg/dL (ref 0.0–1.2)
Total Protein: 6.8 g/dL (ref 6.5–8.1)

## 2024-01-11 LAB — MAGNESIUM: Magnesium: 1.6 mg/dL — ABNORMAL LOW (ref 1.7–2.4)

## 2024-01-11 MED ORDER — SODIUM CHLORIDE 0.9 % IV SOLN
150.0000 mg | Freq: Once | INTRAVENOUS | Status: AC
  Administered 2024-01-11: 150 mg via INTRAVENOUS
  Filled 2024-01-11: qty 150

## 2024-01-11 MED ORDER — ACETAMINOPHEN 325 MG PO TABS
650.0000 mg | ORAL_TABLET | Freq: Once | ORAL | Status: AC
  Administered 2024-01-11: 650 mg via ORAL
  Filled 2024-01-11: qty 2

## 2024-01-11 MED ORDER — FAM-TRASTUZUMAB DERUXTECAN-NXKI CHEMO 100 MG IV SOLR
5.4000 mg/kg | Freq: Once | INTRAVENOUS | Status: AC
  Administered 2024-01-11: 600 mg via INTRAVENOUS
  Filled 2024-01-11: qty 30

## 2024-01-11 MED ORDER — DEXAMETHASONE SOD PHOSPHATE PF 10 MG/ML IJ SOLN
10.0000 mg | Freq: Once | INTRAMUSCULAR | Status: AC
  Administered 2024-01-11: 10 mg via INTRAVENOUS

## 2024-01-11 MED ORDER — DEXTROSE 5 % IV SOLN
INTRAVENOUS | Status: DC
  Filled 2024-01-11: qty 250

## 2024-01-11 MED ORDER — PALONOSETRON HCL INJECTION 0.25 MG/5ML
0.2500 mg | Freq: Once | INTRAVENOUS | Status: AC
  Administered 2024-01-11: 0.25 mg via INTRAVENOUS
  Filled 2024-01-11: qty 5

## 2024-01-11 MED ORDER — DIPHENHYDRAMINE HCL 25 MG PO TABS
50.0000 mg | ORAL_TABLET | Freq: Once | ORAL | Status: AC
  Administered 2024-01-11: 50 mg via ORAL
  Filled 2024-01-11: qty 2

## 2024-01-11 NOTE — Progress Notes (Signed)
 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  S/p adjuvant Transtuzumab and Pertuzumab  11 cycles. S/p adjuvant radiation-  Declined adjuvant Zometa. Developed recurrent Stage IV breast cancer. 08/27/2023 left breast skin punch biopsy showed ER+ PR + HER2 (3+) breast carcinoma 09/08/2023, right axillary lymph node biopsy showed .  ER- PR-  HER2 (3+)  breast carcinoma   Status post 3 cycles of Enhertu  Interval PET scan November 2025 showed chemo response Labs are reviewed and discussed with patient.  November 2025 echocardiogram stable LVEF Proceed with cycle 5 Enhertu    Encounter for antineoplastic chemotherapy Treatment plan as listed above  Normocytic anemia Hemoglobin is stable. Monitor counts.   Hypomagnesemia Improved, recommend patient to increase Slow Mag  to 1 tab BID     Orders Placed This Encounter  Procedures   Magnesium     Standing Status:   Future    Expected Date:   02/01/2024    Expiration Date:   01/31/2025   Magnesium     Standing Status:   Future    Expected Date:   02/22/2024    Expiration Date:   02/21/2025   Cancer antigen 27.29    Standing Status:   Future    Expected Date:   02/22/2024    Expiration Date:   02/21/2025   Cancer antigen 15-3    Standing Status:   Future    Expected Date:   02/22/2024    Expiration Date:   02/21/2025   CBC with Differential (Cancer Center Only)    Standing Status:    Future    Expected Date:   02/22/2024    Expiration Date:   02/21/2025   CMP (Cancer Center only)    Standing Status:   Future    Expected Date:   02/22/2024    Expiration Date:   02/21/2025   Follow up 3 weeks  We spent sufficient time to discuss many aspect of care, questions were answered to patient's satisfaction.  Call Babara, MD, PhD [facility] Hematology Oncology 01/11/2024      HISTORY OF PRESENTING ILLNESS:  Vanessa Fisher is a  63 y.o.  female presents for follow up of Stage IV metastatic breast cancer.  SUMMARY OF ONCOLOGIC HISTORY: Oncology History  Breast cancer (HCC)  07/31/2021 Imaging   Bilateral diagnostic mammogram and US  showed 5 centimeter LEFT breast mass associated with pleomorphic calcifications is suspicious for invasive ductal carcinoma.At least 4 LEFT axillary lymph nodes with abnormal morphology   08/21/2021 Cancer Staging   Staging form: Breast, AJCC 8th Edition - Clinical: Stage IIIB (cT4b, cN3c, cM0, G3, ER-, PR-, HER2+) - Signed by [name], MD on 08/28/2021 Histologic grading system: 3 grade system  -08/21/21 left breast ultrasound-guided biopsy showed invasive mammary carcinoma, grade 3, ER/PR negative, HER2 positive.  Left axillary lymph node biopsy positive for macro metastatic mammary carcinoma, 8 mm in greatest extent.   08/30/2021 Imaging   MRI brain w wo contrast -No metastatic disease or  acute intracranial abnormality. Essentially normal for age MRI appearance of the brain.    09/03/2021 Echocardiogram   1. Left ventricular ejection fraction, by estimation, is 55 to 60%. Left ventricular ejection fraction by 3D volume is 58 %. The left ventricle has normal function. The left ventricle has no regional wall motion abnormalities. There is mild left  ventricular hypertrophy. Left ventricular diastolic parameters were normal.  2. Right ventricular systolic function is normal. The right ventricular size is normal.  3. The mitral valve is normal in  structure. No evidence of mitral valve regurgitation.  4. The aortic valve was not well visualized. Aortic valve regurgitation is not visualized   09/10/2021 Imaging   PET scan showed Large hypermetabolic left breast mass and diffuse skin thickening,consistent with primary breast carcinoma. Hypermetabolic lymphadenopathy in the left axilla, left subpectoral region, and left supraclavicular region, consistent with metastatic disease. No evidence of metastatic disease within the abdomen or pelvis   09/12/2021 - 02/13/2022 Chemotherapy   Patient is on Treatment Plan : BREAST  Docetaxel  + Carboplatin  + Trastuzumab  + Pertuzumab   (TCHP) q21d       Genetic Testing   Negative genetic testing. No pathogenic variants identified on the [facility] Common Hereditary Cancers+RNA panel. The report date is 10/26/2021.  The Common Hereditary Cancers Panel + RNA offered by [facility] includes sequencing and/or deletion duplication testing of the following 47 genes: APC, ATM, AXIN2, BARD1, BMPR1A, BRCA1, BRCA2, BRIP1, CDH1, CDKN2A (p14ARF), CDKN2A (p16INK4a), CKD4, CHEK2, CTNNA1, DICER1, EPCAM (Deletion/duplication testing only), GREM1 (promoter region deletion/duplication testing only), KIT, MEN1, MLH1, MSH2, MSH3, MSH6, MUTYH, NBN, NF1, NHTL1, PALB2, PDGFRA, PMS2, POLD1, POLE, PTEN, RAD50, RAD51C, RAD51D, SDHB, SDHC, SDHD, SMAD4, SMARCA4. STK11, TP53, TSC1, TSC2, and VHL.  The following genes were evaluated for sequence changes only: SDHA and HOXB13 c.251G>A variant only.   02/09/2022 Echocardiogram   LVEF 55 to 60%    03/27/2022 Surgery   S/p left mastectomy + SLNB ypT0 ypN0,    04/24/2022 - 12/23/2022 Chemotherapy   Patient is on Treatment Plan : BREAST Trastuzumab   + Pertuzumab  q21d x 13 cycles     05/06/2022 Echocardiogram   LVEF 55 to 60%    07/02/2022 - 08/03/2022 Radiation Therapy   Adjuvant breast radiation.    10/20/2022 Imaging   Bone scan whole body showed 1. Degenerative uptake at the sternoclavicular  joints, shoulders,knees and hindfoot bilaterally. 2. No evidence for metastatic disease to the. 3. Uptake in the left breast may be related to postsurgical change or residual tumor.   07/28/2023 Mammogram   Unilateral left breast mammogram showed  1. Diffuse MEDIAL LEFT breast erythema and pain without interval change in mammographic appearance or suspicious sonographic abnormality. Stable skin thickening in this area is presumably due to prior radiation therapy. Findings are most suspicious for cellulitis. Recommend treatment with antibiotics. If antibiotics fail to resolve the erythema, surgical or dermatologic consultation for skin punch biopsy is recommended to exclude inflammatory breast cancer.   2. Redemonstrated LEFT breast seroma at the lumpectomy site, now with complex internal debris. Patient requests aspiration of the fluid component of this collection, which will be attempted under ultrasound guidance.   08/27/2023 Relapse/Recurrence   08/27/2023, left breast 11:00 punch biopsy showed 1. Breast, biopsy, left 11:00 punch BX :       - INVASIVE GRADE 3 MAMMARY CARCINOMA  INVADING SKIN AND EPIDERMIS WITH       ULCERATION  ER 70% positive, PR 0%, HER2 positive(3+).    09/08/2023 right axilla  lymph node biopsy showed 1. Lymph node, needle/core biopsy, right axilla, hydromark coil clip :       ONE LYMPH NODE, POSITIVE FOR METASTATIC CARCINOMA MORPHOLOGICALLY COMPATIBLE       WITH BREAST PRIMARY (1/1).       METASTATIC FOCUS: 11 MM / 1.1 CM.       EXTRANODAL EXTENSION IDENTIFIE    09/01/2023 Imaging   MRI breast bilaterally without contrast showed  1. Widespread abnormal enhancement involving the skin and nipple of the LEFT breast, involving all 4 quadrants and compatible with known, biopsy proven LEFT breast cancer invading the skin. There is likely slight RETROAREOLAR extension of the nipple involvement extending 8 mm posteriorly from the nipple.   2. Post treatment  changes of the LEFT breast without other evidence of recurrent malignancy in the LEFT breast. Thin peripheral enhancement at the lumpectomy site is nonspecific but favored to be due to recent instrumentation.   3. Markedly enlarged RIGHT axillary level 1 lymph nodes, for which second-look ultrasound and ultrasound-guided biopsy is recommended. Small but asymmetric RIGHT axillary level 2 lymph nodes are also suspicious for malignancy but not amenable to percutaneous sampling.   4. Indeterminate RIGHT breast focus at the 9 o'clock periareolar position anterior depth measuring 3 mm and indeterminate RIGHT breast mass at the 9 o'clock central position at middle depth measuring 5 mm. If management would be impacted, MRI guided biopsies of the focus and mass at 9 o'clock in the RIGHT breast is recommended.   5. Confluent enhancement along the LEFT internal mammary vessels which could reflect ectatic vascular structures or an enlarged LEFT internal mammary lymph node. Recommend attention on forthcoming PET-CT.     09/10/2023 - 09/10/2023 Chemotherapy   Patient is on Treatment Plan : BREAST Fam-Trastuzumab Deruxtecan-nxki  (Enhertu ) (5.4) q21d     09/10/2023 Imaging   PET scan showed  1. Significant hypermetabolism involving skin thickening of the left breast laterally worrisome for recurrent breast cancer. 2. Extensive hypermetabolic right axillary and subpectoral lymphadenopathy consistent with metastatic nodal disease. 3. Hypermetabolic left internal mammary lymphadenopathy. 4. No findings for abdominal/pelvic metastatic disease or osseous metastatic disease. 5. Large fluid collection in the left breast containing gas suggesting a postoperative fluid collection or abscess. 6. Benign hypermetabolic/brown fat in the neck and chest area.   09/27/2023 -  Chemotherapy   Patient is on Treatment Plan : BREAST Fam-Trastuzumab Deruxtecan-nxki  (Enhertu ) (5.4) q21d     12/15/2023 Imaging   1.  Interval resolution of gas in the left breast fluid collection, now measuring 5.7 x 4.4 cm. 2. Improved cutaneous thickening in the left breast, with current regional maximum SUV 4.5, previously 17.0. 3. Notable reduction in size and activity of right axillary adenopathy without complete resolution, with index node 0.9 cm in short axis and maximum SUV 5.6, previously 1.8 cm with maximum SUV 29.3. 4. Previous left internal mammary adenopathy has resolved. 5. Faintly accentuated activity in the vicinity of the left 2nd rib posterolaterally without CT correlate, favored to represent intercostal muscular activity rather than a true rib lesion, maximum SUV 4.9. Surveillance of this region suggested.    + chronic nasal pressure, congestion, horse voice she attributes to sinusitis.   INTERVAL HISTORY BRIGID VANDEKAMP is a 63 y.o. female who has above history reviewed by me today presents for follow up visit for recurrent HER2 positive breast cancer  -  left breast persistent swelling with drainage in the site of previous biopsy, drainage has improved. Left breast pain has  resolved, except occasional soreness..  Bilateral lower extremity swelling resolved.  She has a history of diarrhea, resolved. She is currently taking Toprol and losartan, prescribed by cardiologist for cardio-protective effects.   MEDICAL HISTORY:  Past Medical History:  Diagnosis Date   Anemia    Anxiety    Arthritis    Asthma    WELL CONTROLLED   Bile acid malabsorption syndrome    Bradycardia    HAD AN ISSUE WITH THIS IN 2016-NO PROBLEMS SINCE   Breast cancer, left breast (HCC) 08/2021   Carpal tunnel syndrome on right    Depression    Diabetes mellitus without complication (HCC)    Diverticulitis    Gastric reflux    GERD (gastroesophageal reflux disease)    Hand pain 05/12/2016   Headache    H/O MIGRAINES   History of kidney stones    H/O   Lumbar radiculitis    Neck pain, chronic    Osteoarthritis  of both knees    Panic attacks    Personal history of chemotherapy    Personal history of radiation therapy     SURGICAL HISTORY: Past Surgical History:  Procedure Laterality Date   BREAST BIOPSY Left 08/21/2021   Axilla Bx, Hydromarker, neg   BREAST BIOPSY Left 08/21/2021   US  Bx, Ribbon Clip, invasive mammary carcinoma   BREAST BIOPSY Left 03/12/2022   US  LT RADIO FREQUENCY TAG LOC US  GUIDE 03/12/2022 [facility]   BREAST BIOPSY Left 03/12/2022   US  LT RADIO FREQUENCY TAG EA ADD LESION LOC US  GUIDE 03/12/2022 [facility]   BREAST LUMPECTOMY Left 03/27/2022   CARPAL TUNNEL RELEASE Right 12/06/2017   Procedure: CARPAL TUNNEL RELEASE;  Surgeon: Cleotilde Barrio, MD;  Location: [facility];  Service: Orthopedics;  Laterality: Right;   COLONOSCOPY WITH PROPOFOL  N/A 06/11/2021   Procedure: COLONOSCOPY WITH PROPOFOL ;  Surgeon: Unk Corinn Skiff, MD;  Location: [facility] ENDOSCOPY;  Service: Gastroenterology;  Laterality: N/A;   DORSAL COMPARTMENT RELEASE Right 12/06/2017   Procedure: RELEASE DORSAL COMPARTMENT (DEQUERVAIN);  Surgeon: Cleotilde Barrio, MD;  Location: [facility];  Service: Orthopedics;  Laterality: Right;   ESOPHAGOGASTRODUODENOSCOPY (EGD) WITH PROPOFOL  N/A 06/11/2021   Procedure: ESOPHAGOGASTRODUODENOSCOPY (EGD) WITH PROPOFOL ;  Surgeon: Unk Corinn Skiff, MD;  Location: [facility] ENDOSCOPY;  Service: Gastroenterology;  Laterality: N/A;   INCISION AND DRAINAGE, ABSCESS, BREAST Left 08/27/2023   Procedure: INCISION AND DRAINAGE, ABSCESS, BREAST;  Surgeon: Rodolph Romano, MD;  Location: [facility];  Service: General;  Laterality: Left;   PART MASTECTOMY,RADIO FREQUENCY LOCALIZER,AXILLARY SENTINEL NODE BIOPSY Left 03/27/2022   Procedure: PART MASTECTOMY,RADIO FREQUENCY LOCALIZER,AXILLARY SENTINEL NODE BIOPSY;  Surgeon: Rodolph Romano, MD;  Location: [facility];  Service: General;  Laterality: Left;   PARTIAL HYSTERECTOMY     PORTACATH PLACEMENT N/A 09/24/2021   Procedure:  INSERTION PORT-A-CATH;  Surgeon: Rodolph Romano, MD;  Location: [facility];  Service: General;  Laterality: N/A;   TUBAL LIGATION      SOCIAL HISTORY: Social History   Socioeconomic History   Marital status: Single    Spouse name: Not on file   Number of children: Not on file   Years of education: Not on file   Highest education level: Not on file  Occupational History   Not on file  Tobacco Use   Smoking status: Former    Types: Cigarettes   Smokeless tobacco: Never  Vaping Use   Vaping status: Never Used  Substance and Sexual Activity   Alcohol use: No   Drug use: No   Sexual activity:  Not on file  Other Topics Concern   Not on file  Social History Narrative   Lives alone   Social Drivers of Health   Financial Resource Strain: Low Risk  (10/20/2023)   Received from [facility]   Overall Financial Resource Strain (CARDIA)    Difficulty of Paying Living Expenses: Not hard at all  Food Insecurity: Food Insecurity Present (10/20/2023)   Received [facility]   Hunger Vital Sign    Within the past 12 months, you worried that your food would run out before you got the money to buy more.: Never true    Within the past 12 months, the food you bought just didn't last and you didn't have money to get more.: Sometimes true  Transportation Needs: No Transportation Needs (10/20/2023)   Received from [facility] - Transportation    In the past 12 months, has lack of transportation kept you from medical appointments or from getting medications?: No    Lack of Transportation (Non-Medical): No  Recent Concern: Transportation Needs - Unmet Transportation Needs (10/08/2023)   Received from [facility] - Transportation    In the past 12 months, has lack of transportation kept you from medical appointments or from getting medications?: Yes    Lack of Transportation (Non-Medical): Yes   Physical Activity: Inactive (08/15/2021)   Exercise Vital Sign    Days of Exercise per Week: 0 days    Minutes of Exercise per Session: 0 min  Stress: Stress Concern Present (08/15/2021)   [employer] of Occupational [employer] - Occupational Stress Questionnaire    Feeling of Stress : Rather much  Social Connections: Socially Isolated (08/15/2021)   Social Connection and Isolation Panel    Frequency of Communication with Friends and Family: Once a week    Frequency of Social Gatherings with Friends and Family: Once a week    Attends Religious Services: Never    Database Administrator or Organizations: No    Attends Engineer, Structural: Not on file    Marital Status: Never married  Intimate Partner Violence: Not At Risk (08/15/2021)   Humiliation, Afraid, Rape, and Kick questionnaire    Fear of Current or Ex-Partner: No    Emotionally Abused: No    Physically Abused: No    Sexually Abused: No    FAMILY HISTORY: Family History  Problem Relation Age of Onset   Diabetes Mother    Hypertension Mother    Heart failure Mother    Pancreatic cancer Mother 6   Diabetes Father    Hypertension Father    Breast cancer Maternal Aunt    Cervical cancer Maternal Aunt    Lung cancer Son 37    ALLERGIES:  is allergic to bc powder [aspirin-salicylamide-caffeine], doxycycline , gabapentin , hydrocodone -acetaminophen , latex, penicillin g, penicillins, and shellfish allergy.  MEDICATIONS:  Current Outpatient Medications  Medication Sig Dispense Refill   albuterol  (PROVENTIL ) (2.5 MG/3ML) 0.083% nebulizer solution Take 3 mLs (2.5 mg total) by nebulization every 4 (four) hours as needed for wheezing or shortness of breath. 75 mL 2   albuterol  (VENTOLIN  HFA) 108 (90 Base) MCG/ACT inhaler Inhale 2 puffs into the lungs every 6 (six) hours as needed for wheezing or shortness of breath. 18 g 0   allopurinol (ZYLOPRIM) 100 MG tablet Take 100 mg by mouth daily.     atorvastatin (LIPITOR) 10  MG tablet Take 10 mg by mouth daily.  Azelastine  HCl 137 MCG/SPRAY SOLN Place 1 spray into the nose daily. 30 mL 0   calcium  carbonate (OS-CAL - DOSED IN MG OF ELEMENTAL CALCIUM ) 1250 (500 Ca) MG tablet Take 1 tablet (1,250 mg total) by mouth daily. 90 tablet 1   celecoxib (CELEBREX) 200 MG capsule Take by mouth 2 (two) times daily.     cetirizine  (ZYRTEC  ALLERGY) 10 MG tablet Take 1 tablet (10 mg total) by mouth daily. 90 tablet 0   cyclobenzaprine  (FLEXERIL ) 10 MG tablet Take 10 mg by mouth 3 (three) times daily.     dexamethasone  (DECADRON ) 4 MG tablet Take 2 tablets (8 mg) by mouth daily for 3 days starting the day after chemotherapy. Take with food. 30 tablet 1   diclofenac Sodium (VOLTAREN) 1 % GEL Apply topically.     diphenoxylate -atropine  (LOMOTIL ) 2.5-0.025 MG tablet Take 1 tablet by mouth 4 (four) times daily as needed for diarrhea or loose stools. 120 tablet 0   docusate sodium  (COLACE) 100 MG capsule Take 1 capsule (100 mg total) by mouth 2 (two) times daily.     DULoxetine (CYMBALTA) 20 MG capsule Take 20 mg by mouth.     escitalopram  (LEXAPRO ) 10 MG tablet Take 1 tablet (10 mg total) by mouth daily. 30 tablet 1   fluconazole  (DIFLUCAN ) 150 MG tablet Take 1 tablet (150 mg total) by mouth once a week. 2 tablet 0   fluticasone  (FLONASE ) 50 MCG/ACT nasal spray Place 2 sprays into both nostrils daily. 15.8 mL 0   KLOR-CON  M20 20 MEQ tablet TAKE 1 TABLET BY MOUTH TWICE A DAY 60 tablet 1   lidocaine -prilocaine  (EMLA ) cream Apply 1 Application topically as needed. Apply small amount to port and cover with saran wrap 1-2 hours prior to port access 30 g 12   loperamide  (IMODIUM ) 2 MG capsule TAKE 4 MG (2 TABLETS) AT FIRST LOOSE STOOL THEN 2 MG (1 TABLET) EVERY 2 TO 4 HOURS FOR EACH SUBSEQUENT LOOSE STOOL. DO NOT EXCEED 8 TABLETS IN 24 HOUR PERIOD. 120 capsule 1   losartan (COZAAR) 25 MG tablet Take 25 mg by mouth daily.     magic mouthwash (multi-ingredient) oral suspension Swish and  swallow 5-10 mLs 4 (four) times daily as needed. 480 mL 3   magic mouthwash w/lidocaine  SOLN Take 5 mLs by mouth 4 (four) times daily as needed for mouth pain. 250 mL 2   magnesium  chloride (SLOW-MAG) 64 MG TBEC SR tablet Take 1 tablet (64 mg total) by mouth 2 (two) times daily. 60 tablet 2   metoprolol succinate (TOPROL-XL) 25 MG 24 hr tablet Take 25 mg by mouth daily.     ondansetron  (ZOFRAN ) 8 MG tablet Take 1 tablet (8 mg total) by mouth every 8 (eight) hours as needed for nausea or vomiting. Start on the third day after chemotherapy. 30 tablet 1   oxyCODONE  HCl 10 MG TABA Take 10 mg by mouth every 4 (four) hours as needed. 14 tablet 0   pantoprazole (PROTONIX) 20 MG tablet Take 20 mg by mouth daily.     pregabalin (LYRICA) 50 MG capsule Take 50 mg by mouth 3 (three) times daily.     prochlorperazine  (COMPAZINE ) 10 MG tablet Take 1 tablet (10 mg total) by mouth every 6 (six) hours as needed for nausea or vomiting. 30 tablet 1   naloxone (NARCAN) nasal spray 4 mg/0.1 mL  (Patient not taking: Reported on 01/11/2024)     No current facility-administered medications for this visit.   Facility-Administered  Medications Ordered in Other Visits  Medication Dose Route Frequency Provider Last Rate Last Admin   magnesium  sulfate IVPB 2 g 50 mL  2 g Intravenous Once Covington, Sarah M, PA-C        Review of Systems  Constitutional:  Negative for appetite change, chills, fatigue and fever.  HENT:   Negative for hearing loss and voice change.   Eyes:  Negative for eye problems.  Respiratory:  Negative for chest tightness and cough.   Cardiovascular:  Negative for chest pain and leg swelling.  Gastrointestinal:  Negative for abdominal distention, abdominal pain, blood in stool and diarrhea.  Endocrine: Negative for hot flashes.  Genitourinary:  Negative for difficulty urinating and frequency.   Musculoskeletal:  Positive for arthralgias and back pain.       Leg cramps  Skin:  Negative for itching  and rash.  Neurological:  Negative for extremity weakness and headaches.  Hematological:  Negative for adenopathy.  Psychiatric/Behavioral:  Negative for confusion.      PHYSICAL EXAMINATION: ECOG PERFORMANCE STATUS: 1 - Symptomatic but completely ambulatory Vitals:   01/11/24 1036  BP: (!) 122/55  Pulse: 85  Resp: 18  Temp: (!) 97.1 F (36.2 C)  SpO2: 100%   Filed Weights   01/11/24 1036  Weight: 236 lb 12.8 oz (107.4 kg)    Physical Exam Constitutional:      General: She is not in acute distress.    Appearance: She is not diaphoretic.  HENT:     Head: Normocephalic and atraumatic.  Eyes:     General: No scleral icterus. Cardiovascular:     Rate and Rhythm: Normal rate and regular rhythm.     Heart sounds: No murmur heard. Pulmonary:     Effort: Pulmonary effort is normal. No respiratory distress.     Breath sounds: No wheezing.  Abdominal:     General: There is no distension.     Palpations: Abdomen is soft.     Tenderness: There is no abdominal tenderness.  Musculoskeletal:        General: Normal range of motion.     Cervical back: Normal range of motion and neck supple.     Right lower leg: No edema.     Left lower leg: No edema.  Skin:    General: Skin is warm and dry.     Findings: No erythema.  Neurological:     Mental Status: She is alert and oriented to person, place, and time.     Cranial Nerves: No cranial nerve deficit.     Motor: No abnormal muscle tone.     Coordination: Coordination normal.  Psychiatric:        Mood and Affect: Affect normal.     Comments:     Left breast tenderness with palpation has resolved.  Palpable the left breast mass, open wound has healed.   LABORATORY DATA:  I have reviewed the data as listed    Latest Ref Rng & Units 01/11/2024   10:26 AM 12/21/2023   12:51 PM 11/29/2023   10:04 AM  CBC  WBC 4.0 - 10.5 K/uL 6.9  7.9  5.9   Hemoglobin 12.0 - 15.0 g/dL 89.9  9.6  8.9   Hematocrit 36.0 - 46.0 % 31.7  30.8   28.9   Platelets 150 - 400 K/uL 354  289  276       Latest Ref Rng & Units 01/11/2024   10:26 AM 12/21/2023   12:51 PM 11/29/2023  10:04 AM  CMP  Glucose 70 - 99 mg/dL 871  881  879   BUN 8 - 23 mg/dL 9  16  15    Creatinine 0.44 - 1.00 mg/dL 9.10  9.02  9.00   Sodium 135 - 145 mmol/L 141  135  137   Potassium 3.5 - 5.1 mmol/L 4.0  4.0  4.0   Chloride 98 - 111 mmol/L 105  102  104   CO2 22 - 32 mmol/L 26  23  27    Calcium  8.9 - 10.3 mg/dL 9.4  8.7  8.6   Total Protein 6.5 - 8.1 g/dL 6.8  7.1  5.8   Total Bilirubin 0.0 - 1.2 mg/dL <9.7  0.4  0.4   Alkaline Phos 38 - 126 U/L 138  112  102   AST 15 - 41 U/L 23  20  24    ALT 0 - 44 U/L 18  17  26        RADIOGRAPHIC STUDIES: I have personally reviewed the radiological images as listed and agreed with the findings in the report. NM PET Image Restag (PS) Skull Base To Thigh Result Date: 12/15/2023 EXAM: PET AND CT SKULL BASE TO MID THIGH 12/13/2023 02:38:10 PM TECHNIQUE: RADIOPHARMACEUTICAL: 12.82 mCi F-18 FDG Uptake time 60 minutes. Glucose level 106 mg/dl. Blood pool SUV 2.4. PET imaging was acquired from the base of the skull to the mid thighs. Non-contrast enhanced computed tomography was obtained for attenuation correction and anatomic localization. COMPARISON: 09/10/2023 CLINICAL HISTORY: breast cancer FINDINGS: HEAD AND NECK: No metabolically active cervical lymphadenopathy. CHEST: No metabolically active pulmonary nodules. Previous left internal mammary adenopathy has resolved. Notable reduction in size and activity of right axillary adenopathy without complete resolution. Index node 0.9 cm in short axis on image 44 series 6 with maximum SUV 5.6 previously 1.8 cm with maximum SUV 29.3. Faintly accentuated activity in the vicinity of the left 2nd rib posterolaterally but without CT correlate, I suspect that this probably represents intercostal muscular activity rather than true rib activity. Maximum SUV 4.9. Right port-a-cath tip in the  upper SVC. ABDOMEN AND PELVIS: No metabolically active intraperitoneal mass. No metabolically active lymphadenopathy. Physiologic activity within the gastrointestinal and genitourinary systems. Mild sigmoid colon diverticulosis. Small type 1 hiatal hernia. BONES AND SOFT TISSUE: No abnormal FDG activity localizes to the bones. No metabolically active aggressive osseous lesion. Improved cutaneous thickening in the left breast current regional maximum SUV 4.5 and previously 17.0. Interval resolution of gas in the left breast fluid collection the fluid collection currently measures 5.7 x 4.4 cm. Benign right iliac bone lesion on image 114 series 6 with sclerotic margin and central low density chronically stable back through 07/13/2020. IMPRESSION: 1. Interval resolution of gas in the left breast fluid collection, now measuring 5.7 x 4.4 cm. 2. Improved cutaneous thickening in the left breast, with current regional maximum SUV 4.5, previously 17.0. 3. Notable reduction in size and activity of right axillary adenopathy without complete resolution, with index node 0.9 cm in short axis and maximum SUV 5.6, previously 1.8 cm with maximum SUV 29.3. 4. Previous left internal mammary adenopathy has resolved. 5. Faintly accentuated activity in the vicinity of the left 2nd rib posterolaterally without CT correlate, favored to represent intercostal muscular activity rather than a true rib lesion, maximum SUV 4.9. Surveillance of this region suggested. Electronically signed by: Ryan Salvage MD 12/15/2023 01:01 PM EST RP Workstation: [id number]

## 2024-01-11 NOTE — Assessment & Plan Note (Signed)
 Treatment plan as listed above.

## 2024-01-11 NOTE — Assessment & Plan Note (Signed)
 Improved, recommend patient to increase Slow Mag  to 1 tab BID

## 2024-01-11 NOTE — Assessment & Plan Note (Signed)
 Left breast cancer, cT4b N3 Mx, ER/PR-, HER2 + S/p neoadjuvant chemotherapy 6 cycles of TCHP  S/p left lumpectomy  + SLNB -ypT0 ypN0, complete pathological response.  S/p adjuvant Transtuzumab and Pertuzumab  11 cycles. S/p adjuvant radiation-  Declined adjuvant Zometa. Developed recurrent Stage IV breast cancer. 08/27/2023 left breast skin punch biopsy showed ER+ PR + HER2 (3+) breast carcinoma 09/08/2023, right axillary lymph node biopsy showed .  ER- PR-  HER2 (3+)  breast carcinoma   Status post 3 cycles of Enhertu  Interval PET scan November 2025 showed chemo response Labs are reviewed and discussed with patient.  November 2025 echocardiogram stable LVEF Proceed with cycle 5 Enhertu 

## 2024-01-11 NOTE — Assessment & Plan Note (Signed)
Hemoglobin is stable. Monitor counts.

## 2024-01-12 LAB — CANCER ANTIGEN 27.29: CA 27.29: 26.7 U/mL (ref 0.0–38.6)

## 2024-01-12 LAB — CANCER ANTIGEN 15-3: CA 15-3: 27.6 U/mL — ABNORMAL HIGH (ref 0.0–25.0)

## 2024-01-12 NOTE — Progress Notes (Signed)
 Spoke to pt and informed her to increase Slow-mag from 1 tab to 2 tab daily.

## 2024-02-01 ENCOUNTER — Encounter: Payer: Self-pay | Admitting: Oncology

## 2024-02-01 ENCOUNTER — Inpatient Hospital Stay

## 2024-02-01 ENCOUNTER — Inpatient Hospital Stay (HOSPITAL_BASED_OUTPATIENT_CLINIC_OR_DEPARTMENT_OTHER): Admitting: Oncology

## 2024-02-01 ENCOUNTER — Telehealth: Payer: Self-pay | Admitting: Pharmacy Technician

## 2024-02-01 VITALS — BP 120/52 | HR 82 | Temp 98.0°F | Ht 66.0 in | Wt 235.0 lb

## 2024-02-01 DIAGNOSIS — Z5112 Encounter for antineoplastic immunotherapy: Secondary | ICD-10-CM | POA: Diagnosis not present

## 2024-02-01 DIAGNOSIS — Z5111 Encounter for antineoplastic chemotherapy: Secondary | ICD-10-CM | POA: Diagnosis not present

## 2024-02-01 DIAGNOSIS — R309 Painful micturition, unspecified: Secondary | ICD-10-CM

## 2024-02-01 DIAGNOSIS — Z171 Estrogen receptor negative status [ER-]: Secondary | ICD-10-CM

## 2024-02-01 DIAGNOSIS — J328 Other chronic sinusitis: Secondary | ICD-10-CM | POA: Diagnosis not present

## 2024-02-01 DIAGNOSIS — D649 Anemia, unspecified: Secondary | ICD-10-CM

## 2024-02-01 DIAGNOSIS — C50912 Malignant neoplasm of unspecified site of left female breast: Secondary | ICD-10-CM | POA: Diagnosis not present

## 2024-02-01 DIAGNOSIS — R300 Dysuria: Secondary | ICD-10-CM

## 2024-02-01 DIAGNOSIS — C50812 Malignant neoplasm of overlapping sites of left female breast: Secondary | ICD-10-CM

## 2024-02-01 LAB — CBC WITH DIFFERENTIAL (CANCER CENTER ONLY)
Abs Immature Granulocytes: 0.04 K/uL (ref 0.00–0.07)
Basophils Absolute: 0.1 K/uL (ref 0.0–0.1)
Basophils Relative: 1 %
Eosinophils Absolute: 0.4 K/uL (ref 0.0–0.5)
Eosinophils Relative: 8 %
HCT: 30.7 % — ABNORMAL LOW (ref 36.0–46.0)
Hemoglobin: 9.5 g/dL — ABNORMAL LOW (ref 12.0–15.0)
Immature Granulocytes: 1 %
Lymphocytes Relative: 29 %
Lymphs Abs: 1.5 K/uL (ref 0.7–4.0)
MCH: 27.5 pg (ref 26.0–34.0)
MCHC: 30.9 g/dL (ref 30.0–36.0)
MCV: 89 fL (ref 80.0–100.0)
Monocytes Absolute: 0.7 K/uL (ref 0.1–1.0)
Monocytes Relative: 14 %
Neutro Abs: 2.4 K/uL (ref 1.7–7.7)
Neutrophils Relative %: 47 %
Platelet Count: 338 K/uL (ref 150–400)
RBC: 3.45 MIL/uL — ABNORMAL LOW (ref 3.87–5.11)
RDW: 17.6 % — ABNORMAL HIGH (ref 11.5–15.5)
WBC Count: 5.1 K/uL (ref 4.0–10.5)
nRBC: 0 % (ref 0.0–0.2)

## 2024-02-01 LAB — MAGNESIUM: Magnesium: 2 mg/dL (ref 1.7–2.4)

## 2024-02-01 LAB — CMP (CANCER CENTER ONLY)
ALT: 16 U/L (ref 0–44)
AST: 25 U/L (ref 15–41)
Albumin: 3.5 g/dL (ref 3.5–5.0)
Alkaline Phosphatase: 115 U/L (ref 38–126)
Anion gap: 10 (ref 5–15)
BUN: 9 mg/dL (ref 8–23)
CO2: 25 mmol/L (ref 22–32)
Calcium: 8.9 mg/dL (ref 8.9–10.3)
Chloride: 106 mmol/L (ref 98–111)
Creatinine: 0.86 mg/dL (ref 0.44–1.00)
GFR, Estimated: >60 mL/min
Glucose, Bld: 102 mg/dL — ABNORMAL HIGH (ref 70–99)
Potassium: 4.2 mmol/L (ref 3.5–5.1)
Sodium: 141 mmol/L (ref 135–145)
Total Bilirubin: <0.2 mg/dL (ref 0.0–1.2)
Total Protein: 6.3 g/dL — ABNORMAL LOW (ref 6.5–8.1)

## 2024-02-01 MED ORDER — AMOXICILLIN-POT CLAVULANATE 875-125 MG PO TABS: 1.0000 | ORAL_TABLET | Freq: Two times a day (BID) | ORAL | 0 refills | Status: AC

## 2024-02-01 MED ORDER — SODIUM CHLORIDE 0.9 % IV SOLN
Freq: Once | INTRAVENOUS | Status: AC
  Filled 2024-02-01: qty 250

## 2024-02-01 NOTE — Telephone Encounter (Signed)
 Received a message from Child Psychotherapist to contact patient regarding assistance with utility bills and housing.  I contacted patient and patient inquiring about grant funding.  Patient stated that she received a $1000 grant in 2023.  Is inquiring about another $1000 grant for 2026.  Explained to patient that the $1000 grant that she received in 2023 is a one-time grant and that all the funds from that grant were exhausted in 2023.    Provided patient with the phone number 211 a non-emergency helpline connecting individuals with community and government services.  Also, made patient aware of the LIEP program.  Told patient to contact [facility] to apply.    Asked patient about applying for assistance for [facility] bills.  Patient indicated that she did not need this assistance because she has Medicaid and Medicare and typically does not have an outstanding balance.  Vanessa Fisher Patient Services Navigator [facility]

## 2024-02-01 NOTE — Patient Instructions (Signed)

## 2024-02-01 NOTE — Assessment & Plan Note (Signed)
 Acute on chronic symptoms.  Recommend a course of Augmentin .  Recommend ENT evaluation

## 2024-02-01 NOTE — Assessment & Plan Note (Signed)
 Check urine urine culture

## 2024-02-01 NOTE — Assessment & Plan Note (Signed)
Hemoglobin is stable. Monitor counts.

## 2024-02-01 NOTE — Progress Notes (Signed)
 [facility] CSW Progress Note  Visual Merchandiser met with patient to follow-up on financial concerns.    Interventions: Provided patient with information about the [employer].  She stated someone told her she could receive the [facility] in 2026 since she last received it in 2023.  CSW referred her to Dickey Fritter.  Informed patient that this CSW was retiring and to contact the same phone number to contact another CSW.       Follow Up Plan:  Patient will contact CSW with any support or resource needs    Vanessa Fisher Au, LCSW Clinical Social Worker [facility]    Patient is participating in a Managed Medicaid Plan:  Yes

## 2024-02-01 NOTE — Assessment & Plan Note (Addendum)
 Left breast cancer, cT4b N3 Mx, ER/PR-, HER2 + S/p neoadjuvant chemotherapy 6 cycles of TCHP  S/p left lumpectomy  + SLNB -ypT0 ypN0, complete pathological response.  S/p adjuvant Transtuzumab and Pertuzumab  11 cycles. S/p adjuvant radiation-  Declined adjuvant Zometa. Developed recurrent Stage IV breast cancer. 08/27/2023 left breast skin punch biopsy showed ER+ PR + HER2 (3+) breast carcinoma 09/08/2023, right axillary lymph node biopsy showed .  ER- PR-  HER2 (3+)  breast carcinoma   Status post 3 cycles of Enhertu  Interval PET scan November 2025 showed chemo response Labs are reviewed and discussed with patient.  November 2025 echocardiogram stable LVEF Proceed with cycle 6 Enhertu  Patient will get 1 L of normal saline for hydration per her request.

## 2024-02-01 NOTE — Assessment & Plan Note (Signed)
 Recommend patient to take calcium 1200mg  daily, otc supply.  Take vitamin D supplementation.

## 2024-02-01 NOTE — Progress Notes (Signed)
 " Hematology/Oncology Progress note [phone] [phone]     CHIEF COMPLAINTS/REASON FOR VISIT:  Follow-up for Stage IV breast cancer, ER-,PR- HER2+ and ER-, PR-, HER2-  ASSESSMENT & PLAN:   Cancer Staging  Breast cancer (HCC) Staging form: Breast, AJCC 8th Edition - Clinical stage from 08/21/2021: Stage IIIB (cT4b, cN3c, cM0, G3, ER-, PR-, HER2+) - Signed by [name], MD on 09/12/2021  Breast cancer (HCC) Left breast cancer, cT4b N3 Mx, ER/PR-, HER2 + S/p neoadjuvant chemotherapy 6 cycles of TCHP  S/p left lumpectomy  + SLNB -ypT0 ypN0, complete pathological response.  S/p adjuvant Transtuzumab and Pertuzumab  11 cycles. S/p adjuvant radiation-  Declined adjuvant Zometa. Developed recurrent Stage IV breast cancer. 08/27/2023 left breast skin punch biopsy showed ER+ PR + HER2 (3+) breast carcinoma 09/08/2023, right axillary lymph node biopsy showed .  ER- PR-  HER2 (3+)  breast carcinoma   Status post 3 cycles of Enhertu  Interval PET scan November 2025 showed chemo response Labs are reviewed and discussed with patient.  November 2025 echocardiogram stable LVEF Proceed with cycle 6 Enhertu  Patient will get 1 L of normal saline for hydration per her request.  Encounter for antineoplastic chemotherapy Treatment plan as listed above  Hypocalcemia Recommend patient to take calcium  1200mg  daily, otc supply.  Take vitamin D  supplementation.   Hypomagnesemia Improved,continue Slow Mag  to 1 tab BID    Normocytic anemia Hemoglobin is stable. Monitor counts.   Chronic sinusitis Acute on chronic symptoms.  Recommend a course of Augmentin .  Recommend ENT evaluation  Dysuria Check urine urine culture    Orders Placed This Encounter  Procedures   Urine Culture    Standing Status:   Future    Number of Occurrences:   1    Expiration Date:   01/31/2025   Magnesium     Standing Status:   Future    Expected Date:   02/09/2024    Expiration Date:   02/08/2025    Cancer antigen 27.29    Standing Status:   Future    Expected Date:   02/09/2024    Expiration Date:   02/08/2025   Cancer antigen 15-3    Standing Status:   Future    Expected Date:   02/09/2024    Expiration Date:   02/08/2025   CBC with Differential (Cancer Center Only)    Standing Status:   Future    Expected Date:   02/09/2024    Expiration Date:   02/08/2025   CMP (Cancer Center only)    Standing Status:   Future    Expected Date:   02/09/2024    Expiration Date:   02/08/2025   Urinalysis, Complete w Microscopic   Follow up 3 weeks  We spent sufficient time to discuss many aspect of care, questions were answered to patient's satisfaction.  Call Babara, MD, PhD [facility] Hematology Oncology 02/01/2024      HISTORY OF PRESENTING ILLNESS:  Vanessa Fisher is a  63 y.o.  female presents for follow up of Stage IV metastatic breast cancer.  SUMMARY OF ONCOLOGIC HISTORY: Oncology History  Breast cancer (HCC)  07/31/2021 Imaging   Bilateral diagnostic mammogram and US  showed 5 centimeter LEFT breast mass associated with pleomorphic calcifications is suspicious for invasive ductal carcinoma.At least 4 LEFT axillary lymph nodes with abnormal morphology   08/21/2021 Cancer Staging   Staging form: Breast, AJCC 8th Edition - Clinical: Stage IIIB (cT4b, cN3c, cM0, G3, ER-, PR-, HER2+) - Signed by Babara,  [place], MD on 08/28/2021 Histologic grading system: 3 grade system  -08/21/21 left breast ultrasound-guided biopsy showed invasive mammary carcinoma, grade 3, ER/PR negative, HER2 positive.  Left axillary lymph node biopsy positive for macro metastatic mammary carcinoma, 8 mm in greatest extent.   08/30/2021 Imaging   MRI brain w wo contrast -No metastatic disease or acute intracranial abnormality. Essentially normal for age MRI appearance of the brain.    09/03/2021 Echocardiogram   1. Left ventricular ejection fraction, by estimation, is 55 to 60%. Left ventricular ejection fraction by 3D  volume is 58 %. The left ventricle has normal function. The left ventricle has no regional wall motion abnormalities. There is mild left  ventricular hypertrophy. Left ventricular diastolic parameters were normal.  2. Right ventricular systolic function is normal. The right ventricular size is normal.  3. The mitral valve is normal in structure. No evidence of mitral valve regurgitation.  4. The aortic valve was not well visualized. Aortic valve regurgitation is not visualized   09/10/2021 Imaging   PET scan showed Large hypermetabolic left breast mass and diffuse skin thickening,consistent with primary breast carcinoma. Hypermetabolic lymphadenopathy in the left axilla, left subpectoral region, and left supraclavicular region, consistent with metastatic disease. No evidence of metastatic disease within the abdomen or pelvis   09/12/2021 - 02/13/2022 Chemotherapy   Patient is on Treatment Plan : BREAST  Docetaxel  + Carboplatin  + Trastuzumab  + Pertuzumab   (TCHP) q21d       Genetic Testing   Negative genetic testing. No pathogenic variants identified on the [facility] Common Hereditary Cancers+RNA panel. The report date is 10/26/2021.  The Common Hereditary Cancers Panel + RNA offered by [facility] includes sequencing and/or deletion duplication testing of the following 47 genes: APC, ATM, AXIN2, BARD1, BMPR1A, BRCA1, BRCA2, BRIP1, CDH1, CDKN2A (p14ARF), CDKN2A (p16INK4a), CKD4, CHEK2, CTNNA1, DICER1, EPCAM (Deletion/duplication testing only), GREM1 (promoter region deletion/duplication testing only), KIT, MEN1, MLH1, MSH2, MSH3, MSH6, MUTYH, NBN, NF1, NHTL1, PALB2, PDGFRA, PMS2, POLD1, POLE, PTEN, RAD50, RAD51C, RAD51D, SDHB, SDHC, SDHD, SMAD4, SMARCA4. STK11, TP53, TSC1, TSC2, and VHL.  The following genes were evaluated for sequence changes only: SDHA and HOXB13 c.251G>A variant only.   02/09/2022 Echocardiogram   LVEF 55 to 60%    03/27/2022 Surgery   S/p left mastectomy + SLNB ypT0 ypN0,    04/24/2022  - 12/23/2022 Chemotherapy   Patient is on Treatment Plan : BREAST Trastuzumab   + Pertuzumab  q21d x 13 cycles     05/06/2022 Echocardiogram   LVEF 55 to 60%    07/02/2022 - 08/03/2022 Radiation Therapy   Adjuvant breast radiation.    10/20/2022 Imaging   Bone scan whole body showed 1. Degenerative uptake at the sternoclavicular joints, shoulders,knees and hindfoot bilaterally. 2. No evidence for metastatic disease to the. 3. Uptake in the left breast may be related to postsurgical change or residual tumor.   07/28/2023 Mammogram   Unilateral left breast mammogram showed  1. Diffuse MEDIAL LEFT breast erythema and pain without interval change in mammographic appearance or suspicious sonographic abnormality. Stable skin thickening in this area is presumably due to prior radiation therapy. Findings are most suspicious for cellulitis. Recommend treatment with antibiotics. If antibiotics fail to resolve the erythema, surgical or dermatologic consultation for skin punch biopsy is recommended to exclude inflammatory breast cancer.   2. Redemonstrated LEFT breast seroma at the lumpectomy site, now with complex internal debris. Patient requests aspiration of the fluid component of this collection, which will be attempted under ultrasound guidance.  08/27/2023 Relapse/Recurrence   08/27/2023, left breast 11:00 punch biopsy showed 1. Breast, biopsy, left 11:00 punch BX :       - INVASIVE GRADE 3 MAMMARY CARCINOMA  INVADING SKIN AND EPIDERMIS WITH       ULCERATION  ER 70% positive, PR 0%, HER2 positive(3+).    09/08/2023 right axilla lymph node biopsy showed 1. Lymph node, needle/core biopsy, right axilla, hydromark coil clip :       ONE LYMPH NODE, POSITIVE FOR METASTATIC CARCINOMA MORPHOLOGICALLY COMPATIBLE       WITH BREAST PRIMARY (1/1).       METASTATIC FOCUS: 11 MM / 1.1 CM.       EXTRANODAL EXTENSION IDENTIFIE    09/01/2023 Imaging   MRI breast bilaterally without contrast  showed  1. Widespread abnormal enhancement involving the skin and nipple of the LEFT breast, involving all 4 quadrants and compatible with known, biopsy proven LEFT breast cancer invading the skin. There is likely slight RETROAREOLAR extension of the nipple involvement extending 8 mm posteriorly from the nipple.   2. Post treatment changes of the LEFT breast without other evidence of recurrent malignancy in the LEFT breast. Thin peripheral enhancement at the lumpectomy site is nonspecific but favored to be due to recent instrumentation.   3. Markedly enlarged RIGHT axillary level 1 lymph nodes, for which second-look ultrasound and ultrasound-guided biopsy is recommended. Small but asymmetric RIGHT axillary level 2 lymph nodes are also suspicious for malignancy but not amenable to percutaneous sampling.   4. Indeterminate RIGHT breast focus at the 9 o'clock periareolar position anterior depth measuring 3 mm and indeterminate RIGHT breast mass at the 9 o'clock central position at middle depth measuring 5 mm. If management would be impacted, MRI guided biopsies of the focus and mass at 9 o'clock in the RIGHT breast is recommended.   5. Confluent enhancement along the LEFT internal mammary vessels which could reflect ectatic vascular structures or an enlarged LEFT internal mammary lymph node. Recommend attention on forthcoming PET-CT.     09/10/2023 - 09/10/2023 Chemotherapy   Patient is on Treatment Plan : BREAST Fam-Trastuzumab Deruxtecan-nxki  (Enhertu ) (5.4) q21d     09/10/2023 Imaging   PET scan showed  1. Significant hypermetabolism involving skin thickening of the left breast laterally worrisome for recurrent breast cancer. 2. Extensive hypermetabolic right axillary and subpectoral lymphadenopathy consistent with metastatic nodal disease. 3. Hypermetabolic left internal mammary lymphadenopathy. 4. No findings for abdominal/pelvic metastatic disease or osseous metastatic  disease. 5. Large fluid collection in the left breast containing gas suggesting a postoperative fluid collection or abscess. 6. Benign hypermetabolic/brown fat in the neck and chest area.   09/27/2023 -  Chemotherapy   Patient is on Treatment Plan : BREAST Fam-Trastuzumab Deruxtecan-nxki  (Enhertu ) (5.4) q21d     12/15/2023 Imaging   1. Interval resolution of gas in the left breast fluid collection, now measuring 5.7 x 4.4 cm. 2. Improved cutaneous thickening in the left breast, with current regional maximum SUV 4.5, previously 17.0. 3. Notable reduction in size and activity of right axillary adenopathy without complete resolution, with index node 0.9 cm in short axis and maximum SUV 5.6, previously 1.8 cm with maximum SUV 29.3. 4. Previous left internal mammary adenopathy has resolved. 5. Faintly accentuated activity in the vicinity of the left 2nd rib posterolaterally without CT correlate, favored to represent intercostal muscular activity rather than a true rib lesion, maximum SUV 4.9. Surveillance of this region suggested.    + chronic nasal pressure, congestion, horse  voice she attributes to sinusitis.   INTERVAL HISTORY KORI GOINS is a 63 y.o. female who has above history reviewed by me today presents for follow up visit for recurrent HER2 positive breast cancer   Discussed the use of AI scribe software for clinical note transcription with the patient, who gave verbal consent to proceed.   Over the past two weeks, she has developed acute nasal congestion, headache, facial pain, and oral soreness, which have impaired her ability to eat. She has used cough medicine and nasal spray for symptomatic relief and typically uses Flonase , but is currently out. She denies post-nasal drip. She was unable to obtain a timely appointment with her primary care provider.  She has history of chronic sinusitis.  She reports increased urinary frequency and possible dysuria, She maintains  adequate oral hydration and denies fever, chills, or night sweats.  She has experienced recurrent episodes of dizziness lasting three to four minutes, resulting in two falls when not holding onto support. She denies chest pain or syncope. Appetite remains good,She requests IV fluids, believing it may improve her strength.  She is currently taking Toprol and losartan, prescribed by cardiologist for cardio-protective effects.   MEDICAL HISTORY:  Past Medical History:  Diagnosis Date   Anemia    Anxiety    Arthritis    Asthma    WELL CONTROLLED   Bile acid malabsorption syndrome    Bradycardia    HAD AN ISSUE WITH THIS IN 2016-NO PROBLEMS SINCE   Breast cancer, left breast (HCC) 08/2021   Carpal tunnel syndrome on right    Depression    Diabetes mellitus without complication (HCC)    Diverticulitis    Gastric reflux    GERD (gastroesophageal reflux disease)    Hand pain 05/12/2016   Headache    H/O MIGRAINES   History of kidney stones    H/O   Lumbar radiculitis    Neck pain, chronic    Osteoarthritis of both knees    Panic attacks    Personal history of chemotherapy    Personal history of radiation therapy     SURGICAL HISTORY: Past Surgical History:  Procedure Laterality Date   BREAST BIOPSY Left 08/21/2021   Axilla Bx, Hydromarker, neg   BREAST BIOPSY Left 08/21/2021   US  Bx, Ribbon Clip, invasive mammary carcinoma   BREAST BIOPSY Left 03/12/2022   US  LT RADIO FREQUENCY TAG LOC US  GUIDE 03/12/2022 [facility]   BREAST BIOPSY Left 03/12/2022   US  LT RADIO FREQUENCY TAG EA ADD LESION LOC US  GUIDE 03/12/2022 [facility]   BREAST LUMPECTOMY Left 03/27/2022   CARPAL TUNNEL RELEASE Right 12/06/2017   Procedure: CARPAL TUNNEL RELEASE;  Surgeon: Cleotilde Barrio, MD;  Location: [facility];  Service: Orthopedics;  Laterality: Right;   COLONOSCOPY WITH PROPOFOL  N/A 06/11/2021   Procedure: COLONOSCOPY WITH PROPOFOL ;  Surgeon: Unk Corinn Skiff, MD;  Location: [facility]  ENDOSCOPY;  Service: Gastroenterology;  Laterality: N/A;   DORSAL COMPARTMENT RELEASE Right 12/06/2017   Procedure: RELEASE DORSAL COMPARTMENT (DEQUERVAIN);  Surgeon: Cleotilde Barrio, MD;  Location: [facility];  Service: Orthopedics;  Laterality: Right;   ESOPHAGOGASTRODUODENOSCOPY (EGD) WITH PROPOFOL  N/A 06/11/2021   Procedure: ESOPHAGOGASTRODUODENOSCOPY (EGD) WITH PROPOFOL ;  Surgeon: Unk Corinn Skiff, MD;  Location: [facility] ENDOSCOPY;  Service: Gastroenterology;  Laterality: N/A;   INCISION AND DRAINAGE, ABSCESS, BREAST Left 08/27/2023   Procedure: INCISION AND DRAINAGE, ABSCESS, BREAST;  Surgeon: Rodolph Romano, MD;  Location: [facility];  Service: General;  Laterality: Left;   PART MASTECTOMY,RADIO FREQUENCY  LOCALIZER,AXILLARY SENTINEL NODE BIOPSY Left 03/27/2022   Procedure: PART MASTECTOMY,RADIO FREQUENCY LOCALIZER,AXILLARY SENTINEL NODE BIOPSY;  Surgeon: Rodolph Romano, MD;  Location: [facility];  Service: General;  Laterality: Left;   PARTIAL HYSTERECTOMY     PORTACATH PLACEMENT N/A 09/24/2021   Procedure: INSERTION PORT-A-CATH;  Surgeon: Rodolph Romano, MD;  Location: [facility];  Service: General;  Laterality: N/A;   TUBAL LIGATION      SOCIAL HISTORY: Social History   Socioeconomic History   Marital status: Single    Spouse name: Not on file   Number of children: Not on file   Years of education: Not on file   Highest education level: Not on file  Occupational History   Not on file  Tobacco Use   Smoking status: Former    Types: Cigarettes   Smokeless tobacco: Never  Vaping Use   Vaping status: Never Used  Substance and Sexual Activity   Alcohol use: No   Drug use: No   Sexual activity: Not on file  Other Topics Concern   Not on file  Social History Narrative   Lives alone   Social Drivers of Health   Tobacco Use: Medium Risk (02/01/2024)   Patient History    Smoking Tobacco Use: Former    Smokeless Tobacco Use: Never    Passive Exposure: Not on  Actuary Strain: Low Risk  (10/20/2023)   Received from [facility]   Overall Financial Resource Strain (CARDIA)    Difficulty of Paying Living Expenses: Not hard at all  Food Insecurity: Food Insecurity Present (10/20/2023)   Received from [facility]   Epic    Within the past 12 months, you worried that your food would run out before you got the money to buy more.: Never true    Within the past 12 months, the food you bought just didn't last and you didn't have money to get more.: Sometimes true  Transportation Needs: No Transportation Needs (10/20/2023)   Received from [facility] - Transportation    In the past 12 months, has lack of transportation kept you from medical appointments or from getting medications?: No    Lack of Transportation (Non-Medical): No  Recent Concern: Transportation Needs - Unmet Transportation Needs (10/08/2023)   Received from [facility] - Transportation    In the past 12 months, has lack of transportation kept you from medical appointments or from getting medications?: Yes    Lack of Transportation (Non-Medical): Yes  Physical Activity: Inactive (08/15/2021)   Exercise Vital Sign    Days of Exercise per Week: 0 days    Minutes of Exercise per Session: 0 min  Stress: Stress Concern Present (08/15/2021)   [employer] of Occupational Health - Occupational Stress Questionnaire    Feeling of Stress : Rather much  Social Connections: Socially Isolated (08/15/2021)   Social Connection and Isolation Panel    Frequency of Communication with Friends and Family: Once a week    Frequency of Social Gatherings with Friends and Family: Once a week    Attends Religious Services: Never    Database Administrator or Organizations: No    Attends Engineer, Structural: Not on file    Marital Status: Never married  Intimate Partner Violence: Not At Risk  (08/15/2021)   Humiliation, Afraid, Rape, and Kick questionnaire    Fear of Current or Ex-Partner: No    Emotionally Abused:  No    Physically Abused: No    Sexually Abused: No  Depression (PHQ2-9): Low Risk (02/01/2024)   Depression (PHQ2-9)    PHQ-2 Score: 0  Alcohol Screen: Not on file  Housing: Low Risk  (10/20/2023)   Received from [facility]   Epic    In the last 12 months, was there a time when you were not able to pay the mortgage or rent on time?: No    In the past 12 months, how many times have you moved where you were living?: 0    At any time in the past 12 months, were you homeless or living in a shelter (including now)?: No  Recent Concern: Housing - High Risk (10/08/2023)   Received from [facility]   Epic    In the last 12 months, was there a time when you were not able to pay the mortgage or rent on time?: No    In the past 12 months, how many times have you moved where you were living?: 0    At any time in the past 12 months, were you homeless or living in a shelter (including now)?: Yes  Utilities: Not At Risk (10/20/2023)   Received from [facility]   Epic    In the past 12 months has the electric, gas, oil, or water  company threatened to shut off services in your home?: No  Recent Concern: Utilities - At Risk (10/08/2023)   Received from [facility]   Epic    In the past 12 months has the electric, gas, oil, or water  company threatened to shut off services in your home?: Yes  Health Literacy: Not on file    FAMILY HISTORY: Family History  Problem Relation Age of Onset   Diabetes Mother    Hypertension Mother    Heart failure Mother    Pancreatic cancer Mother 75   Diabetes Father    Hypertension Father    Breast cancer Maternal Aunt    Cervical cancer Maternal Aunt    Lung cancer Son 42    ALLERGIES:  is allergic to bc powder [aspirin-salicylamide-caffeine], doxycycline , gabapentin ,  hydrocodone -acetaminophen , latex, penicillin g, penicillins, and shellfish allergy.  MEDICATIONS:  Current Outpatient Medications  Medication Sig Dispense Refill   albuterol  (PROVENTIL ) (2.5 MG/3ML) 0.083% nebulizer solution Take 3 mLs (2.5 mg total) by nebulization every 4 (four) hours as needed for wheezing or shortness of breath. 75 mL 2   albuterol  (VENTOLIN  HFA) 108 (90 Base) MCG/ACT inhaler Inhale 2 puffs into the lungs every 6 (six) hours as needed for wheezing or shortness of breath. 18 g 0   allopurinol (ZYLOPRIM) 100 MG tablet Take 100 mg by mouth daily.     amoxicillin -clavulanate (AUGMENTIN ) 875-125 MG tablet Take 1 tablet by mouth 2 (two) times daily. 14 tablet 0   atorvastatin (LIPITOR) 10 MG tablet Take 10 mg by mouth daily.     Azelastine  HCl 137 MCG/SPRAY SOLN Place 1 spray into the nose daily. 30 mL 0   calcium  carbonate (OS-CAL - DOSED IN MG OF ELEMENTAL CALCIUM ) 1250 (500 Ca) MG tablet Take 1 tablet (1,250 mg total) by mouth daily. 90 tablet 1   celecoxib (CELEBREX) 200 MG capsule Take by mouth 2 (two) times daily.     cetirizine  (ZYRTEC  ALLERGY) 10 MG tablet Take 1 tablet (10 mg total) by mouth daily. 90 tablet 0   cyclobenzaprine  (FLEXERIL ) 10 MG tablet Take 10 mg by  mouth 3 (three) times daily.     dexamethasone  (DECADRON ) 4 MG tablet Take 2 tablets (8 mg) by mouth daily for 3 days starting the day after chemotherapy. Take with food. 30 tablet 1   diclofenac Sodium (VOLTAREN) 1 % GEL Apply topically.     diphenoxylate -atropine  (LOMOTIL ) 2.5-0.025 MG tablet Take 1 tablet by mouth 4 (four) times daily as needed for diarrhea or loose stools. 120 tablet 0   docusate sodium  (COLACE) 100 MG capsule Take 1 capsule (100 mg total) by mouth 2 (two) times daily.     DULoxetine (CYMBALTA) 20 MG capsule Take 20 mg by mouth.     escitalopram  (LEXAPRO ) 10 MG tablet Take 1 tablet (10 mg total) by mouth daily. 30 tablet 1   fluconazole  (DIFLUCAN ) 150 MG tablet Take 1 tablet (150 mg total)  by mouth once a week. 2 tablet 0   fluticasone  (FLONASE ) 50 MCG/ACT nasal spray Place 2 sprays into both nostrils daily. 15.8 mL 0   KLOR-CON  M20 20 MEQ tablet TAKE 1 TABLET BY MOUTH TWICE A DAY 60 tablet 1   lidocaine -prilocaine  (EMLA ) cream Apply 1 Application topically as needed. Apply small amount to port and cover with saran wrap 1-2 hours prior to port access 30 g 12   loperamide  (IMODIUM ) 2 MG capsule TAKE 4 MG (2 TABLETS) AT FIRST LOOSE STOOL THEN 2 MG (1 TABLET) EVERY 2 TO 4 HOURS FOR EACH SUBSEQUENT LOOSE STOOL. DO NOT EXCEED 8 TABLETS IN 24 HOUR PERIOD. 120 capsule 1   losartan (COZAAR) 25 MG tablet Take 25 mg by mouth daily.     magic mouthwash (multi-ingredient) oral suspension Swish and swallow 5-10 mLs 4 (four) times daily as needed. 480 mL 3   magic mouthwash w/lidocaine  SOLN Take 5 mLs by mouth 4 (four) times daily as needed for mouth pain. 250 mL 2   magnesium  chloride (SLOW-MAG) 64 MG TBEC SR tablet Take 1 tablet (64 mg total) by mouth 2 (two) times daily. 60 tablet 2   metoprolol succinate (TOPROL-XL) 25 MG 24 hr tablet Take 25 mg by mouth daily.     naloxone (NARCAN) nasal spray 4 mg/0.1 mL      ondansetron  (ZOFRAN ) 8 MG tablet Take 1 tablet (8 mg total) by mouth every 8 (eight) hours as needed for nausea or vomiting. Start on the third day after chemotherapy. 30 tablet 1   oxyCODONE  HCl 10 MG TABA Take 10 mg by mouth every 4 (four) hours as needed. 14 tablet 0   pantoprazole (PROTONIX) 20 MG tablet Take 20 mg by mouth daily.     pregabalin (LYRICA) 50 MG capsule Take 50 mg by mouth 3 (three) times daily.     prochlorperazine  (COMPAZINE ) 10 MG tablet Take 1 tablet (10 mg total) by mouth every 6 (six) hours as needed for nausea or vomiting. 30 tablet 1   No current facility-administered medications for this visit.   Facility-Administered Medications Ordered in Other Visits  Medication Dose Route Frequency Provider Last Rate Last Admin   magnesium  sulfate IVPB 2 g 50 mL  2 g  Intravenous Once [place], Sarah M, PA-C        Review of Systems  Constitutional:  Negative for appetite change, chills, fatigue and fever.  HENT:   Negative for hearing loss and voice change.        Nasal congestion, facial pain  Eyes:  Negative for eye problems.  Respiratory:  Negative for chest tightness and cough.   Cardiovascular:  Negative for chest pain and leg swelling.  Gastrointestinal:  Negative for abdominal distention, abdominal pain, blood in stool and diarrhea.  Endocrine: Negative for hot flashes.  Genitourinary:  Positive for dysuria. Negative for difficulty urinating and frequency.   Musculoskeletal:  Positive for arthralgias and back pain.       Leg cramps  Skin:  Negative for itching and rash.  Neurological:  Positive for dizziness. Negative for extremity weakness and headaches.  Hematological:  Negative for adenopathy.  Psychiatric/Behavioral:  Negative for confusion.      PHYSICAL EXAMINATION: ECOG PERFORMANCE STATUS: 1 - Symptomatic but completely ambulatory Vitals:   02/01/24 1124  BP: (!) 120/52  Pulse: 82  Temp: 98 F (36.7 C)  SpO2: 98%   Filed Weights   02/01/24 1124  Weight: 235 lb (106.6 kg)    Physical Exam Constitutional:      General: She is not in acute distress.    Appearance: She is not diaphoretic.  HENT:     Head: Normocephalic and atraumatic.  Eyes:     General: No scleral icterus. Cardiovascular:     Rate and Rhythm: Normal rate and regular rhythm.     Heart sounds: No murmur heard. Pulmonary:     Effort: Pulmonary effort is normal. No respiratory distress.     Breath sounds: No wheezing.  Abdominal:     General: There is no distension.     Palpations: Abdomen is soft.     Tenderness: There is no abdominal tenderness.  Musculoskeletal:        General: Normal range of motion.     Cervical back: Normal range of motion and neck supple.     Right lower leg: No edema.     Left lower leg: No edema.  Skin:    General:  Skin is warm and dry.     Findings: No erythema.  Neurological:     Mental Status: She is alert and oriented to person, place, and time.     Cranial Nerves: No cranial nerve deficit.     Motor: No abnormal muscle tone.     Coordination: Coordination normal.  Psychiatric:        Mood and Affect: Affect normal.     Comments:     Left breast tenderness with palpation has resolved.  Palpable the left breast mass, open wound has healed.   LABORATORY DATA:  I have reviewed the data as listed    Latest Ref Rng & Units 02/01/2024   10:45 AM 01/11/2024   10:26 AM 12/21/2023   12:51 PM  CBC  WBC 4.0 - 10.5 K/uL 5.1  6.9  7.9   Hemoglobin 12.0 - 15.0 g/dL 9.5  89.9  9.6   Hematocrit 36.0 - 46.0 % 30.7  31.7  30.8   Platelets 150 - 400 K/uL 338  354  289       Latest Ref Rng & Units 02/01/2024   10:45 AM 01/11/2024   10:26 AM 12/21/2023   12:51 PM  CMP  Glucose 70 - 99 mg/dL 897  871  881   BUN 8 - 23 mg/dL 9  9  16    Creatinine 0.44 - 1.00 mg/dL 9.13  9.10  9.02   Sodium 135 - 145 mmol/L 141  141  135   Potassium 3.5 - 5.1 mmol/L 4.2  4.0  4.0   Chloride 98 - 111 mmol/L 106  105  102   CO2 22 - 32 mmol/L 25  26  23   Calcium  8.9 - 10.3 mg/dL 8.9  9.4  8.7   Total Protein 6.5 - 8.1 g/dL 6.3  6.8  7.1   Total Bilirubin 0.0 - 1.2 mg/dL <9.7  <9.7  0.4   Alkaline Phos 38 - 126 U/L 115  138  112   AST 15 - 41 U/L 25  23  20    ALT 0 - 44 U/L 16  18  17        RADIOGRAPHIC STUDIES: I have personally reviewed the radiological images as listed and agreed with the findings in the report. No results found.      "

## 2024-02-01 NOTE — Assessment & Plan Note (Signed)
 Treatment plan as listed above.

## 2024-02-01 NOTE — Assessment & Plan Note (Signed)
 Improved,continue Slow Mag  to 1 tab BID

## 2024-02-02 LAB — URINALYSIS, COMPLETE (UACMP) WITH MICROSCOPIC
Bilirubin Urine: NEGATIVE
Glucose, UA: NEGATIVE mg/dL
Hgb urine dipstick: NEGATIVE
Ketones, ur: NEGATIVE mg/dL
Nitrite: NEGATIVE
Protein, ur: NEGATIVE mg/dL
Specific Gravity, Urine: 1.018 (ref 1.005–1.030)
Squamous Epithelial / HPF: 0 /HPF (ref 0–5)
pH: 5 (ref 5.0–8.0)

## 2024-02-02 LAB — CANCER ANTIGEN 15-3: CA 15-3: 23.6 U/mL (ref 0.0–25.0)

## 2024-02-02 LAB — CANCER ANTIGEN 27.29: CA 27.29: 23.1 U/mL (ref 0.0–38.6)

## 2024-02-03 LAB — URINE CULTURE

## 2024-02-07 ENCOUNTER — Inpatient Hospital Stay: Attending: Oncology | Admitting: Hospice and Palliative Medicine

## 2024-02-07 DIAGNOSIS — J329 Chronic sinusitis, unspecified: Secondary | ICD-10-CM | POA: Insufficient documentation

## 2024-02-07 DIAGNOSIS — C773 Secondary and unspecified malignant neoplasm of axilla and upper limb lymph nodes: Secondary | ICD-10-CM | POA: Insufficient documentation

## 2024-02-07 DIAGNOSIS — Z515 Encounter for palliative care: Secondary | ICD-10-CM

## 2024-02-07 DIAGNOSIS — C50212 Malignant neoplasm of upper-inner quadrant of left female breast: Secondary | ICD-10-CM | POA: Insufficient documentation

## 2024-02-07 DIAGNOSIS — Z171 Estrogen receptor negative status [ER-]: Secondary | ICD-10-CM

## 2024-02-07 DIAGNOSIS — Z5112 Encounter for antineoplastic immunotherapy: Secondary | ICD-10-CM | POA: Insufficient documentation

## 2024-02-07 DIAGNOSIS — Z170 Estrogen receptor positive status [ER+]: Secondary | ICD-10-CM | POA: Insufficient documentation

## 2024-02-07 DIAGNOSIS — I959 Hypotension, unspecified: Secondary | ICD-10-CM | POA: Insufficient documentation

## 2024-02-07 DIAGNOSIS — D649 Anemia, unspecified: Secondary | ICD-10-CM | POA: Insufficient documentation

## 2024-02-07 DIAGNOSIS — C50812 Malignant neoplasm of overlapping sites of left female breast: Secondary | ICD-10-CM

## 2024-02-07 DIAGNOSIS — R42 Dizziness and giddiness: Secondary | ICD-10-CM | POA: Insufficient documentation

## 2024-02-07 NOTE — Progress Notes (Signed)
 Virtual Visit via Telephone Note  I connected with Vanessa Fisher on 02/07/2024 at  3:00 PM EST by telephone and verified that I am speaking with the correct person using two identifiers.  Location: Patient: Home Provider: Clinic   I discussed the limitations, risks, security and privacy concerns of performing an evaluation and management service by telephone and the availability of in person appointments. I also discussed with the patient that there may be a patient responsible charge related to this service. The patient expressed understanding and agreed to proceed.   History of Present Illness: Vanessa Fisher is a 64 y.o. 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  Palliative care consulted to address goals.  Observations/Objective: I called and spoke with patient by phone.  Patient reports that she had some dizziness, which she attributed to recent sinus infection.  She says that she fell after standing too quickly.  Denies loss of consciousness.  Reports overall weakness, fatigue, and dizziness have improved.  I offered clinic eval but she declined this.  She is scheduled for MD follow-up later this week.  Patient feels that her appetite and fluid intake are good.  She denies any symptomatic complaints or concerns today.  Patient does endorse some financial distress.  She has been in contact with social work.  Assessment and Plan: Breast cancer - on [place]   Fatigue/dizziness - improving.  Clinic eval as needed  Financial distress -social work following  Follow Up Instructions: Follow-up telephone visit 2 to 3 months   I discussed the assessment and treatment plan with the patient. The patient was provided an opportunity to ask questions and all were answered. The patient agreed with the plan and demonstrated an understanding of the instructions.   The patient was advised to call back or seek an in-person  evaluation if the symptoms worsen or if the condition fails to improve as anticipated.  I provided 10 minutes of non-face-to-face time during this encounter.   FONDA JONELLE MOWER, NP

## 2024-02-09 ENCOUNTER — Ambulatory Visit: Admission: EM | Admit: 2024-02-09 | Discharge: 2024-02-09 | Discharge status: Against Medical Advice

## 2024-02-09 MED FILL — Fosaprepitant Dimeglumine For IV Infusion 150 MG (Base Eq): INTRAVENOUS | Qty: 5 | Status: AC

## 2024-02-10 ENCOUNTER — Inpatient Hospital Stay

## 2024-02-10 ENCOUNTER — Encounter: Payer: Self-pay | Admitting: Oncology

## 2024-02-10 ENCOUNTER — Inpatient Hospital Stay (HOSPITAL_BASED_OUTPATIENT_CLINIC_OR_DEPARTMENT_OTHER): Admitting: Oncology

## 2024-02-10 VITALS — BP 133/70 | HR 55

## 2024-02-10 VITALS — BP 107/55 | HR 68 | Temp 98.0°F | Resp 20 | Wt 231.0 lb

## 2024-02-10 DIAGNOSIS — J329 Chronic sinusitis, unspecified: Secondary | ICD-10-CM | POA: Diagnosis not present

## 2024-02-10 DIAGNOSIS — C50812 Malignant neoplasm of overlapping sites of left female breast: Secondary | ICD-10-CM | POA: Diagnosis not present

## 2024-02-10 DIAGNOSIS — Z5112 Encounter for antineoplastic immunotherapy: Secondary | ICD-10-CM | POA: Diagnosis present

## 2024-02-10 DIAGNOSIS — Z171 Estrogen receptor negative status [ER-]: Secondary | ICD-10-CM | POA: Diagnosis not present

## 2024-02-10 DIAGNOSIS — C773 Secondary and unspecified malignant neoplasm of axilla and upper limb lymph nodes: Secondary | ICD-10-CM | POA: Diagnosis not present

## 2024-02-10 DIAGNOSIS — Z170 Estrogen receptor positive status [ER+]: Secondary | ICD-10-CM | POA: Diagnosis not present

## 2024-02-10 DIAGNOSIS — C50912 Malignant neoplasm of unspecified site of left female breast: Secondary | ICD-10-CM

## 2024-02-10 DIAGNOSIS — R42 Dizziness and giddiness: Secondary | ICD-10-CM | POA: Insufficient documentation

## 2024-02-10 DIAGNOSIS — D649 Anemia, unspecified: Secondary | ICD-10-CM

## 2024-02-10 DIAGNOSIS — C50212 Malignant neoplasm of upper-inner quadrant of left female breast: Secondary | ICD-10-CM | POA: Diagnosis present

## 2024-02-10 DIAGNOSIS — I959 Hypotension, unspecified: Secondary | ICD-10-CM | POA: Diagnosis not present

## 2024-02-10 LAB — CBC WITH DIFFERENTIAL (CANCER CENTER ONLY)
Abs Immature Granulocytes: 0.07 K/uL (ref 0.00–0.07)
Basophils Absolute: 0.1 K/uL (ref 0.0–0.1)
Basophils Relative: 1 %
Eosinophils Absolute: 0.2 K/uL (ref 0.0–0.5)
Eosinophils Relative: 3 %
HCT: 33.4 % — ABNORMAL LOW (ref 36.0–46.0)
Hemoglobin: 10.1 g/dL — ABNORMAL LOW (ref 12.0–15.0)
Immature Granulocytes: 1 %
Lymphocytes Relative: 25 %
Lymphs Abs: 1.8 K/uL (ref 0.7–4.0)
MCH: 26.9 pg (ref 26.0–34.0)
MCHC: 30.2 g/dL (ref 30.0–36.0)
MCV: 88.8 fL (ref 80.0–100.0)
Monocytes Absolute: 0.7 K/uL (ref 0.1–1.0)
Monocytes Relative: 10 %
Neutro Abs: 4.5 K/uL (ref 1.7–7.7)
Neutrophils Relative %: 60 %
Platelet Count: 302 K/uL (ref 150–400)
RBC: 3.76 MIL/uL — ABNORMAL LOW (ref 3.87–5.11)
RDW: 17 % — ABNORMAL HIGH (ref 11.5–15.5)
WBC Count: 7.4 K/uL (ref 4.0–10.5)
nRBC: 0 % (ref 0.0–0.2)

## 2024-02-10 LAB — CMP (CANCER CENTER ONLY)
ALT: 19 U/L (ref 0–44)
AST: 26 U/L (ref 15–41)
Albumin: 3.6 g/dL (ref 3.5–5.0)
Alkaline Phosphatase: 130 U/L — ABNORMAL HIGH (ref 38–126)
Anion gap: 10 (ref 5–15)
BUN: 12 mg/dL (ref 8–23)
CO2: 24 mmol/L (ref 22–32)
Calcium: 9.1 mg/dL (ref 8.9–10.3)
Chloride: 106 mmol/L (ref 98–111)
Creatinine: 0.92 mg/dL (ref 0.44–1.00)
GFR, Estimated: >60 mL/min
Glucose, Bld: 113 mg/dL — ABNORMAL HIGH (ref 70–99)
Potassium: 3.9 mmol/L (ref 3.5–5.1)
Sodium: 141 mmol/L (ref 135–145)
Total Bilirubin: <0.2 mg/dL (ref 0.0–1.2)
Total Protein: 6.9 g/dL (ref 6.5–8.1)

## 2024-02-10 LAB — MAGNESIUM: Magnesium: 1.6 mg/dL — ABNORMAL LOW (ref 1.7–2.4)

## 2024-02-10 MED ORDER — FLUCONAZOLE 150 MG PO TABS: 150.0000 mg | ORAL_TABLET | Freq: Once | ORAL | 0 refills | Status: AC

## 2024-02-10 MED ORDER — SODIUM CHLORIDE 0.9 % IV SOLN
Freq: Once | INTRAVENOUS | Status: AC
  Filled 2024-02-10: qty 250

## 2024-02-10 NOTE — Assessment & Plan Note (Signed)
 Dizziness and fall.  ? Vertigo recommend ENT eval

## 2024-02-10 NOTE — Progress Notes (Signed)
 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  S/p adjuvant Transtuzumab and Pertuzumab  11 cycles. S/p adjuvant radiation-  Declined adjuvant Zometa. Developed recurrent Stage IV breast cancer. 08/27/2023 left breast skin punch biopsy showed ER+ PR + HER2 (3+) breast carcinoma 09/08/2023, right axillary lymph node biopsy showed .  ER- PR-  HER2 (3+)  breast carcinoma   Status post 3 cycles of Enhertu  Interval PET scan November 2025 showed chemo response Labs are reviewed and discussed with patient.  November 2025 echocardiogram stable LVEF Hold  Enhertu  due to hypotension. Patient will get 1 L of normal saline for hydration   Chronic sinusitis Acute on chronic symptoms. S/p a course of Augmentin .  Recommend ENT evaluation  Normocytic anemia Hemoglobin is stable. Monitor counts.   Hypocalcemia Recommend patient to take calcium  1200mg  daily, otc supply.  Take vitamin D  supplementation.   Dizziness Dizziness and fall.  ? Vertigo recommend ENT eval  Hypomagnesemia continue Slow Mag  to 1 tab BID      Orders Placed This Encounter  Procedures   Magnesium     Standing Status:   Future    Expected Date:   02/24/2024    Expiration Date:   02/23/2025   Cancer antigen 27.29    Standing Status:   Future    Expected Date:   02/24/2024    Expiration Date:   02/23/2025   Cancer antigen 15-3    Standing Status:   Future    Expected  Date:   02/24/2024    Expiration Date:   02/23/2025   CBC with Differential (Cancer Center Only)    Standing Status:   Future    Expected Date:   02/24/2024    Expiration Date:   02/23/2025   CMP (Cancer Center only)    Standing Status:   Future    Expected Date:   02/24/2024    Expiration Date:   02/23/2025   Ambulatory referral to ENT    Referral Priority:   Routine    Referral Type:   Consultation    Referral Reason:   Specialty Services Required    Requested Specialty:   Otolaryngology    Number of Visits Requested:   1   Follow up 2 weeks  We spent sufficient time to discuss many aspect of care, questions were answered to patient's satisfaction.  Call Babara, MD, PhD [facility] Hematology Oncology 02/10/2024      HISTORY OF PRESENTING ILLNESS:  Vanessa Fisher is a  64 y.o.  female presents for follow up of Stage IV metastatic breast cancer.  SUMMARY OF ONCOLOGIC HISTORY: Oncology History  Breast cancer (HCC)  07/31/2021 Imaging   Bilateral diagnostic mammogram and US  showed 5 centimeter LEFT breast mass associated with pleomorphic calcifications is suspicious for invasive ductal carcinoma.At least 4 LEFT axillary lymph nodes with abnormal morphology   08/21/2021 Cancer Staging   Staging form: Breast, AJCC 8th Edition - Clinical: Stage IIIB (cT4b, cN3c, cM0,  G3, ER-, PR-, HER2+) - Signed by [name], MD on 08/28/2021 Histologic grading system: 3 grade system  -08/21/21 left breast ultrasound-guided biopsy showed invasive mammary carcinoma, grade 3, ER/PR negative, HER2 positive.  Left axillary lymph node biopsy positive for macro metastatic mammary carcinoma, 8 mm in greatest extent.   08/30/2021 Imaging   MRI brain w wo contrast -No metastatic disease or acute intracranial abnormality. Essentially normal for age MRI appearance of the brain.    09/03/2021 Echocardiogram   1. Left ventricular ejection fraction, by estimation, is 55 to 60%. Left ventricular ejection fraction  by 3D volume is 58 %. The left ventricle has normal function. The left ventricle has no regional wall motion abnormalities. There is mild left  ventricular hypertrophy. Left ventricular diastolic parameters were normal.  2. Right ventricular systolic function is normal. The right ventricular size is normal.  3. The mitral valve is normal in structure. No evidence of mitral valve regurgitation.  4. The aortic valve was not well visualized. Aortic valve regurgitation is not visualized   09/10/2021 Imaging   PET scan showed Large hypermetabolic left breast mass and diffuse skin thickening,consistent with primary breast carcinoma. Hypermetabolic lymphadenopathy in the left axilla, left subpectoral region, and left supraclavicular region, consistent with metastatic disease. No evidence of metastatic disease within the abdomen or pelvis   09/12/2021 - 02/13/2022 Chemotherapy   Patient is on Treatment Plan : BREAST  Docetaxel  + Carboplatin  + Trastuzumab  + Pertuzumab   (TCHP) q21d       Genetic Testing   Negative genetic testing. No pathogenic variants identified on the [facility] Common Hereditary Cancers+RNA panel. The report date is 10/26/2021.  The Common Hereditary Cancers Panel + RNA offered by [facility] includes sequencing and/or deletion duplication testing of the following 47 genes: APC, ATM, AXIN2, BARD1, BMPR1A, BRCA1, BRCA2, BRIP1, CDH1, CDKN2A (p14ARF), CDKN2A (p16INK4a), CKD4, CHEK2, CTNNA1, DICER1, EPCAM (Deletion/duplication testing only), GREM1 (promoter region deletion/duplication testing only), KIT, MEN1, MLH1, MSH2, MSH3, MSH6, MUTYH, NBN, NF1, NHTL1, PALB2, PDGFRA, PMS2, POLD1, POLE, PTEN, RAD50, RAD51C, RAD51D, SDHB, SDHC, SDHD, SMAD4, SMARCA4. STK11, TP53, TSC1, TSC2, and VHL.  The following genes were evaluated for sequence changes only: SDHA and HOXB13 c.251G>A variant only.   02/09/2022 Echocardiogram   LVEF 55 to 60%    03/27/2022 Surgery   S/p left mastectomy + SLNB ypT0 ypN0,     04/24/2022 - 12/23/2022 Chemotherapy   Patient is on Treatment Plan : BREAST Trastuzumab   + Pertuzumab  q21d x 13 cycles     05/06/2022 Echocardiogram   LVEF 55 to 60%    07/02/2022 - 08/03/2022 Radiation Therapy   Adjuvant breast radiation.    10/20/2022 Imaging   Bone scan whole body showed 1. Degenerative uptake at the sternoclavicular joints, shoulders,knees and hindfoot bilaterally. 2. No evidence for metastatic disease to the. 3. Uptake in the left breast may be related to postsurgical change or residual tumor.   07/28/2023 Mammogram   Unilateral left breast mammogram showed  1. Diffuse MEDIAL LEFT breast erythema and pain without interval change in mammographic appearance or suspicious sonographic abnormality. Stable skin thickening in this area is presumably due to prior radiation therapy. Findings are most suspicious for cellulitis. Recommend treatment with antibiotics. If antibiotics fail to resolve the erythema, surgical or dermatologic consultation for skin punch biopsy is recommended to exclude inflammatory breast cancer.   2. Redemonstrated LEFT breast seroma at the lumpectomy site, now with complex internal debris. Patient requests aspiration of the fluid component of this collection, which  will be attempted under ultrasound guidance.   08/27/2023 Relapse/Recurrence   08/27/2023, left breast 11:00 punch biopsy showed 1. Breast, biopsy, left 11:00 punch BX :       - INVASIVE GRADE 3 MAMMARY CARCINOMA  INVADING SKIN AND EPIDERMIS WITH       ULCERATION  ER 70% positive, PR 0%, HER2 positive(3+).    09/08/2023 right axilla lymph node biopsy showed 1. Lymph node, needle/core biopsy, right axilla, hydromark coil clip :       ONE LYMPH NODE, POSITIVE FOR METASTATIC CARCINOMA MORPHOLOGICALLY COMPATIBLE       WITH BREAST PRIMARY (1/1).       METASTATIC FOCUS: 11 MM / 1.1 CM.       EXTRANODAL EXTENSION IDENTIFIE    09/01/2023 Imaging   MRI breast bilaterally without  contrast showed  1. Widespread abnormal enhancement involving the skin and nipple of the LEFT breast, involving all 4 quadrants and compatible with known, biopsy proven LEFT breast cancer invading the skin. There is likely slight RETROAREOLAR extension of the nipple involvement extending 8 mm posteriorly from the nipple.   2. Post treatment changes of the LEFT breast without other evidence of recurrent malignancy in the LEFT breast. Thin peripheral enhancement at the lumpectomy site is nonspecific but favored to be due to recent instrumentation.   3. Markedly enlarged RIGHT axillary level 1 lymph nodes, for which second-look ultrasound and ultrasound-guided biopsy is recommended. Small but asymmetric RIGHT axillary level 2 lymph nodes are also suspicious for malignancy but not amenable to percutaneous sampling.   4. Indeterminate RIGHT breast focus at the 9 o'clock periareolar position anterior depth measuring 3 mm and indeterminate RIGHT breast mass at the 9 o'clock central position at middle depth measuring 5 mm. If management would be impacted, MRI guided biopsies of the focus and mass at 9 o'clock in the RIGHT breast is recommended.   5. Confluent enhancement along the LEFT internal mammary vessels which could reflect ectatic vascular structures or an enlarged LEFT internal mammary lymph node. Recommend attention on forthcoming PET-CT.     09/10/2023 - 09/10/2023 Chemotherapy   Patient is on Treatment Plan : BREAST Fam-Trastuzumab Deruxtecan-nxki  (Enhertu ) (5.4) q21d     09/10/2023 Imaging   PET scan showed  1. Significant hypermetabolism involving skin thickening of the left breast laterally worrisome for recurrent breast cancer. 2. Extensive hypermetabolic right axillary and subpectoral lymphadenopathy consistent with metastatic nodal disease. 3. Hypermetabolic left internal mammary lymphadenopathy. 4. No findings for abdominal/pelvic metastatic disease or osseous metastatic  disease. 5. Large fluid collection in the left breast containing gas suggesting a postoperative fluid collection or abscess. 6. Benign hypermetabolic/brown fat in the neck and chest area.   09/27/2023 -  Chemotherapy   Patient is on Treatment Plan : BREAST Fam-Trastuzumab Deruxtecan-nxki  (Enhertu ) (5.4) q21d     12/15/2023 Imaging   1. Interval resolution of gas in the left breast fluid collection, now measuring 5.7 x 4.4 cm. 2. Improved cutaneous thickening in the left breast, with current regional maximum SUV 4.5, previously 17.0. 3. Notable reduction in size and activity of right axillary adenopathy without complete resolution, with index node 0.9 cm in short axis and maximum SUV 5.6, previously 1.8 cm with maximum SUV 29.3. 4. Previous left internal mammary adenopathy has resolved. 5. Faintly accentuated activity in the vicinity of the left 2nd rib posterolaterally without CT correlate, favored to represent intercostal muscular activity rather than a true rib lesion, maximum SUV 4.9. Surveillance of this region suggested.    +  chronic nasal pressure, congestion, horse voice she attributes to sinusitis.   INTERVAL HISTORY Vanessa Fisher is a 64 y.o. female who has above history reviewed by me today presents for follow up visit for recurrent HER2 positive breast cancer   Discussed the use of AI scribe software for clinical note transcription with the patient, who gave verbal consent to proceed.   She has history of chronic sinusitis. Symptoms improved after a course of Augmentin .  Subsequently, she developed vaginal pruritus and odor for the past two days, which she reports has occurred previously after antibiotic use. She has previously been treated with fluconazole  for similar symptoms. She has not yet established care with ENT    She reports 4 falls over the past two weeks, associated with episodes of dizziness, particularly upon rising from prolonged sitting. She denies  ongoing dizziness. Blood pressure has been noted to be low. She reports good appetite and a one-pound weight gain over the past month.    MEDICAL HISTORY:  Past Medical History:  Diagnosis Date   Anemia    Anxiety    Arthritis    Asthma    WELL CONTROLLED   Bile acid malabsorption syndrome    Bradycardia    HAD AN ISSUE WITH THIS IN 2016-NO PROBLEMS SINCE   Breast cancer, left breast (HCC) 08/2021   Carpal tunnel syndrome on right    Depression    Diabetes mellitus without complication (HCC)    Diverticulitis    Gastric reflux    GERD (gastroesophageal reflux disease)    Hand pain 05/12/2016   Headache    H/O MIGRAINES   History of kidney stones    H/O   Lumbar radiculitis    Neck pain, chronic    Osteoarthritis of both knees    Panic attacks    Personal history of chemotherapy    Personal history of radiation therapy     SURGICAL HISTORY: Past Surgical History:  Procedure Laterality Date   BREAST BIOPSY Left 08/21/2021   Axilla Bx, Hydromarker, neg   BREAST BIOPSY Left 08/21/2021   US  Bx, Ribbon Clip, invasive mammary carcinoma   BREAST BIOPSY Left 03/12/2022   US  LT RADIO FREQUENCY TAG LOC US  GUIDE 03/12/2022 [facility]   BREAST BIOPSY Left 03/12/2022   US  LT RADIO FREQUENCY TAG EA ADD LESION LOC US  GUIDE 03/12/2022 [facility]   BREAST LUMPECTOMY Left 03/27/2022   CARPAL TUNNEL RELEASE Right 12/06/2017   Procedure: CARPAL TUNNEL RELEASE;  Surgeon: Cleotilde Barrio, MD;  Location: [facility];  Service: Orthopedics;  Laterality: Right;   COLONOSCOPY WITH PROPOFOL  N/A 06/11/2021   Procedure: COLONOSCOPY WITH PROPOFOL ;  Surgeon: Unk Corinn Skiff, MD;  Location: [facility] ENDOSCOPY;  Service: Gastroenterology;  Laterality: N/A;   DORSAL COMPARTMENT RELEASE Right 12/06/2017   Procedure: RELEASE DORSAL COMPARTMENT (DEQUERVAIN);  Surgeon: Cleotilde Barrio, MD;  Location: [facility];  Service: Orthopedics;  Laterality: Right;   ESOPHAGOGASTRODUODENOSCOPY (EGD) WITH PROPOFOL   N/A 06/11/2021   Procedure: ESOPHAGOGASTRODUODENOSCOPY (EGD) WITH PROPOFOL ;  Surgeon: Unk Corinn Skiff, MD;  Location: [facility] ENDOSCOPY;  Service: Gastroenterology;  Laterality: N/A;   INCISION AND DRAINAGE, ABSCESS, BREAST Left 08/27/2023   Procedure: INCISION AND DRAINAGE, ABSCESS, BREAST;  Surgeon: Rodolph Romano, MD;  Location: [facility];  Service: General;  Laterality: Left;   PART MASTECTOMY,RADIO FREQUENCY LOCALIZER,AXILLARY SENTINEL NODE BIOPSY Left 03/27/2022   Procedure: PART MASTECTOMY,RADIO FREQUENCY LOCALIZER,AXILLARY SENTINEL NODE BIOPSY;  Surgeon: Rodolph Romano, MD;  Location: [facility];  Service: General;  Laterality: Left;   PARTIAL HYSTERECTOMY  PORTACATH PLACEMENT N/A 09/24/2021   Procedure: INSERTION PORT-A-CATH;  Surgeon: Rodolph Romano, MD;  Location: [facility];  Service: General;  Laterality: N/A;   TUBAL LIGATION      SOCIAL HISTORY: Social History   Socioeconomic History   Marital status: Single    Spouse name: Not on file   Number of children: Not on file   Years of education: Not on file   Highest education level: Not on file  Occupational History   Not on file  Tobacco Use   Smoking status: Former    Types: Cigarettes   Smokeless tobacco: Never  Vaping Use   Vaping status: Never Used  Substance and Sexual Activity   Alcohol use: No   Drug use: No   Sexual activity: Not on file  Other Topics Concern   Not on file  Social History Narrative   Lives alone   Social Drivers of Health   Tobacco Use: Medium Risk (02/10/2024)   Patient History    Smoking Tobacco Use: Former    Smokeless Tobacco Use: Never    Passive Exposure: Not on file  Financial Resource Strain: Low Risk  (10/20/2023)   Received from [facility]   Overall Financial Resource Strain (CARDIA)    Difficulty of Paying Living Expenses: Not hard at all  Food Insecurity: Food Insecurity Present (10/20/2023)   Received from [facility]   Epic    Within the past 12 months, you worried that your food would run out before you got the money to buy more.: Never true    Within the past 12 months, the food you bought just didn't last and you didn't have money to get more.: Sometimes true  Transportation Needs: No Transportation Needs (10/20/2023)   Received from [facility] - Transportation    In the past 12 months, has lack of transportation kept you from medical appointments or from getting medications?: No    Lack of Transportation (Non-Medical): No  Recent Concern: Transportation Needs - Unmet Transportation Needs (10/08/2023)   Received from [facility] - Transportation    In the past 12 months, has lack of transportation kept you from medical appointments or from getting medications?: Yes    Lack of Transportation (Non-Medical): Yes  Physical Activity: Inactive (08/15/2021)   Exercise Vital Sign    Days of Exercise per Week: 0 days    Minutes of Exercise per Session: 0 min  Stress: Stress Concern Present (08/15/2021)   [employer] - Occupational Stress Questionnaire    Feeling of Stress : Rather much  Social Connections: Socially Isolated (08/15/2021)   Social Connection and Isolation Panel    Frequency of Communication with Friends and Family: Once a week    Frequency of Social Gatherings with Friends and Family: Once a week    Attends Religious Services: Never    Database Administrator or Organizations: No    Attends Engineer, Structural: Not on file    Marital Status: Never married  Intimate Partner Violence: Not At Risk (08/15/2021)   Humiliation, Afraid, Rape, and Kick questionnaire    Fear of Current or Ex-Partner: No    Emotionally Abused: No    Physically Abused: No    Sexually Abused: No  Depression (PHQ2-9): Low Risk (02/01/2024)   Depression (PHQ2-9)    PHQ-2 Score: 0  Alcohol Screen: Not on file  Housing:  Low Risk  (  10/20/2023)   Received from [facility]   Epic    In the last 12 months, was there a time when you were not able to pay the mortgage or rent on time?: No    In the past 12 months, how many times have you moved where you were living?: 0    At any time in the past 12 months, were you homeless or living in a shelter (including now)?: No  Recent Concern: Housing - High Risk (10/08/2023)   Received from [facility]   Epic    In the last 12 months, was there a time when you were not able to pay the mortgage or rent on time?: No    In the past 12 months, how many times have you moved where you were living?: 0    At any time in the past 12 months, were you homeless or living in a shelter (including now)?: Yes  Utilities: Not At Risk (10/20/2023)   Received from [facility]   Epic    In the past 12 months has the electric, gas, oil, or water  company threatened to shut off services in your home?: No  Recent Concern: Utilities - At Risk (10/08/2023)   Received from [facility]   Epic    In the past 12 months has the electric, gas, oil, or water  company threatened to shut off services in your home?: Yes  Health Literacy: Not on file    FAMILY HISTORY: Family History  Problem Relation Age of Onset   Diabetes Mother    Hypertension Mother    Heart failure Mother    Pancreatic cancer Mother 56   Diabetes Father    Hypertension Father    Breast cancer Maternal Aunt    Cervical cancer Maternal Aunt    Lung cancer Son 62    ALLERGIES:  is allergic to bc powder [aspirin-salicylamide-caffeine], doxycycline , gabapentin , hydrocodone -acetaminophen , latex, penicillin g, penicillins, and shellfish allergy.  MEDICATIONS:  Current Outpatient Medications  Medication Sig Dispense Refill   albuterol  (PROVENTIL ) (2.5 MG/3ML) 0.083% nebulizer solution Take 3 mLs (2.5 mg total) by nebulization every 4 (four) hours as needed for  wheezing or shortness of breath. 75 mL 2   albuterol  (VENTOLIN  HFA) 108 (90 Base) MCG/ACT inhaler Inhale 2 puffs into the lungs every 6 (six) hours as needed for wheezing or shortness of breath. 18 g 0   allopurinol (ZYLOPRIM) 100 MG tablet Take 100 mg by mouth daily.     atorvastatin (LIPITOR) 10 MG tablet Take 10 mg by mouth daily.     Azelastine  HCl 137 MCG/SPRAY SOLN Place 1 spray into the nose daily. 30 mL 0   calcium  carbonate (OS-CAL - DOSED IN MG OF ELEMENTAL CALCIUM ) 1250 (500 Ca) MG tablet Take 1 tablet (1,250 mg total) by mouth daily. 90 tablet 1   celecoxib (CELEBREX) 200 MG capsule Take by mouth 2 (two) times daily.     cetirizine  (ZYRTEC  ALLERGY) 10 MG tablet Take 1 tablet (10 mg total) by mouth daily. 90 tablet 0   cyclobenzaprine  (FLEXERIL ) 10 MG tablet Take 10 mg by mouth 3 (three) times daily.     dexamethasone  (DECADRON ) 4 MG tablet Take 2 tablets (8 mg) by mouth daily for 3 days starting the day after chemotherapy. Take with food. 30 tablet 1   diclofenac Sodium (VOLTAREN) 1 % GEL Apply topically.     diphenoxylate -atropine  (LOMOTIL ) 2.5-0.025 MG tablet Take 1 tablet by  mouth 4 (four) times daily as needed for diarrhea or loose stools. 120 tablet 0   docusate sodium  (COLACE) 100 MG capsule Take 1 capsule (100 mg total) by mouth 2 (two) times daily.     DULoxetine (CYMBALTA) 20 MG capsule Take 20 mg by mouth.     escitalopram  (LEXAPRO ) 10 MG tablet Take 1 tablet (10 mg total) by mouth daily. 30 tablet 1   fluticasone  (FLONASE ) 50 MCG/ACT nasal spray Place 2 sprays into both nostrils daily. 15.8 mL 0   KLOR-CON  M20 20 MEQ tablet TAKE 1 TABLET BY MOUTH TWICE A DAY 60 tablet 1   lidocaine -prilocaine  (EMLA ) cream Apply 1 Application topically as needed. Apply small amount to port and cover with saran wrap 1-2 hours prior to port access 30 g 12   loperamide  (IMODIUM ) 2 MG capsule TAKE 4 MG (2 TABLETS) AT FIRST LOOSE STOOL THEN 2 MG (1 TABLET) EVERY 2 TO 4 HOURS FOR EACH SUBSEQUENT  LOOSE STOOL. DO NOT EXCEED 8 TABLETS IN 24 HOUR PERIOD. 120 capsule 1   losartan (COZAAR) 25 MG tablet Take 25 mg by mouth daily.     magic mouthwash (multi-ingredient) oral suspension Swish and swallow 5-10 mLs 4 (four) times daily as needed. 480 mL 3   magic mouthwash w/lidocaine  SOLN Take 5 mLs by mouth 4 (four) times daily as needed for mouth pain. 250 mL 2   magnesium  chloride (SLOW-MAG) 64 MG TBEC SR tablet Take 1 tablet (64 mg total) by mouth 2 (two) times daily. 60 tablet 2   metoprolol succinate (TOPROL-XL) 25 MG 24 hr tablet Take 25 mg by mouth daily.     naloxone (NARCAN) nasal spray 4 mg/0.1 mL      ondansetron  (ZOFRAN ) 8 MG tablet Take 1 tablet (8 mg total) by mouth every 8 (eight) hours as needed for nausea or vomiting. Start on the third day after chemotherapy. 30 tablet 1   oxyCODONE  HCl 10 MG TABA Take 10 mg by mouth every 4 (four) hours as needed. 14 tablet 0   pantoprazole (PROTONIX) 20 MG tablet Take 20 mg by mouth daily.     pregabalin (LYRICA) 50 MG capsule Take 50 mg by mouth 3 (three) times daily.     prochlorperazine  (COMPAZINE ) 10 MG tablet Take 1 tablet (10 mg total) by mouth every 6 (six) hours as needed for nausea or vomiting. 30 tablet 1   amoxicillin -clavulanate (AUGMENTIN ) 875-125 MG tablet Take 1 tablet by mouth 2 (two) times daily. (Patient not taking: Reported on 02/10/2024) 14 tablet 0   fluconazole  (DIFLUCAN ) 150 MG tablet Take 1 tablet (150 mg total) by mouth once for 1 dose. 1 tablet 0   No current facility-administered medications for this visit.   Facility-Administered Medications Ordered in Other Visits  Medication Dose Route Frequency Provider Last Rate Last Admin   magnesium  sulfate IVPB 2 g 50 mL  2 g Intravenous Once Covington, Sarah M, PA-C        Review of Systems  Constitutional:  Negative for appetite change, chills, fatigue and fever.  HENT:   Negative for hearing loss and voice change.        Nasal congestion, facial pain  Eyes:  Negative  for eye problems.  Respiratory:  Negative for chest tightness and cough.   Cardiovascular:  Negative for chest pain and leg swelling.  Gastrointestinal:  Negative for abdominal distention, abdominal pain, blood in stool and diarrhea.  Endocrine: Negative for hot flashes.  Genitourinary:  Positive for dysuria.  Negative for difficulty urinating and frequency.   Musculoskeletal:  Positive for arthralgias and back pain.       Leg cramps  Skin:  Negative for itching and rash.  Neurological:  Positive for dizziness. Negative for extremity weakness and headaches.  Hematological:  Negative for adenopathy.  Psychiatric/Behavioral:  Negative for confusion.      PHYSICAL EXAMINATION: ECOG PERFORMANCE STATUS: 1 - Symptomatic but completely ambulatory Vitals:   02/10/24 1048  BP: (!) 107/55  Pulse: 68  Resp: 20  Temp: 98 F (36.7 C)  SpO2: 100%   Filed Weights   02/10/24 1048  Weight: 231 lb (104.8 kg)    Physical Exam Constitutional:      General: She is not in acute distress.    Appearance: She is not diaphoretic.  HENT:     Head: Normocephalic and atraumatic.  Eyes:     General: No scleral icterus. Cardiovascular:     Rate and Rhythm: Normal rate and regular rhythm.     Heart sounds: No murmur heard. Pulmonary:     Effort: Pulmonary effort is normal. No respiratory distress.     Breath sounds: No wheezing.  Abdominal:     General: There is no distension.     Palpations: Abdomen is soft.     Tenderness: There is no abdominal tenderness.  Musculoskeletal:        General: Normal range of motion.     Cervical back: Normal range of motion and neck supple.     Right lower leg: No edema.     Left lower leg: No edema.  Skin:    General: Skin is warm and dry.     Findings: No erythema.  Neurological:     Mental Status: She is alert and oriented to person, place, and time.     Cranial Nerves: No cranial nerve deficit.     Motor: No abnormal muscle tone.     Coordination:  Coordination normal.  Psychiatric:        Mood and Affect: Affect normal.     Comments:     Left breast tenderness with palpation has resolved.  Palpable the left breast mass, open wound has healed.   LABORATORY DATA:  I have reviewed the data as listed    Latest Ref Rng & Units 02/10/2024   10:35 AM 02/01/2024   10:45 AM 01/11/2024   10:26 AM  CBC  WBC 4.0 - 10.5 K/uL 7.4  5.1  6.9   Hemoglobin 12.0 - 15.0 g/dL 89.8  9.5  89.9   Hematocrit 36.0 - 46.0 % 33.4  30.7  31.7   Platelets 150 - 400 K/uL 302  338  354       Latest Ref Rng & Units 02/10/2024   10:35 AM 02/01/2024   10:45 AM 01/11/2024   10:26 AM  CMP  Glucose 70 - 99 mg/dL 886  897  871   BUN 8 - 23 mg/dL 12  9  9    Creatinine 0.44 - 1.00 mg/dL 9.07  9.13  9.10   Sodium 135 - 145 mmol/L 141  141  141   Potassium 3.5 - 5.1 mmol/L 3.9  4.2  4.0   Chloride 98 - 111 mmol/L 106  106  105   CO2 22 - 32 mmol/L 24  25  26    Calcium  8.9 - 10.3 mg/dL 9.1  8.9  9.4   Total Protein 6.5 - 8.1 g/dL 6.9  6.3  6.8   Total Bilirubin  0.0 - 1.2 mg/dL <9.7  <9.7  <9.7   Alkaline Phos 38 - 126 U/L 130  115  138   AST 15 - 41 U/L 26  25  23    ALT 0 - 44 U/L 19  16  18        RADIOGRAPHIC STUDIES: I have personally reviewed the radiological images as listed and agreed with the findings in the report. No results found.      "

## 2024-02-10 NOTE — Patient Instructions (Signed)

## 2024-02-10 NOTE — Assessment & Plan Note (Addendum)
 Acute on chronic symptoms. S/p a course of Augmentin .  Recommend ENT evaluation

## 2024-02-10 NOTE — Assessment & Plan Note (Signed)
Hemoglobin is stable. Monitor counts.

## 2024-02-10 NOTE — Assessment & Plan Note (Signed)
 Recommend patient to take calcium 1200mg  daily, otc supply.  Take vitamin D supplementation.

## 2024-02-10 NOTE — Assessment & Plan Note (Signed)
 continue Slow Mag  to 1 tab BID

## 2024-02-10 NOTE — Assessment & Plan Note (Addendum)
 Left breast cancer, cT4b N3 Mx, ER/PR-, HER2 + S/p neoadjuvant chemotherapy 6 cycles of TCHP  S/p left lumpectomy  + SLNB -ypT0 ypN0, complete pathological response.  S/p adjuvant Transtuzumab and Pertuzumab  11 cycles. S/p adjuvant radiation-  Declined adjuvant Zometa. Developed recurrent Stage IV breast cancer. 08/27/2023 left breast skin punch biopsy showed ER+ PR + HER2 (3+) breast carcinoma 09/08/2023, right axillary lymph node biopsy showed .  ER- PR-  HER2 (3+)  breast carcinoma   Status post 3 cycles of Enhertu  Interval PET scan November 2025 showed chemo response Labs are reviewed and discussed with patient.  November 2025 echocardiogram stable LVEF Hold  Enhertu  due to hypotension. Patient will get 1 L of normal saline for hydration

## 2024-02-10 NOTE — Patient Instructions (Signed)

## 2024-02-11 ENCOUNTER — Telehealth: Payer: Self-pay

## 2024-02-11 LAB — CANCER ANTIGEN 27.29: CA 27.29: 12.4 U/mL (ref 0.0–38.6)

## 2024-02-11 LAB — CANCER ANTIGEN 15-3: CA 15-3: 23.1 U/mL (ref 0.0–25.0)

## 2024-02-11 NOTE — Telephone Encounter (Signed)
 ENT referral faxed to Dr.Bennet at [facility]  Re: chronic sinusitis, dizziness.  Fax confirmation received.

## 2024-02-22 ENCOUNTER — Inpatient Hospital Stay

## 2024-02-22 ENCOUNTER — Inpatient Hospital Stay: Admitting: Oncology

## 2024-02-23 MED FILL — Fosaprepitant Dimeglumine For IV Infusion 150 MG (Base Eq): INTRAVENOUS | Qty: 5 | Status: AC

## 2024-02-24 ENCOUNTER — Inpatient Hospital Stay

## 2024-02-24 ENCOUNTER — Encounter: Payer: Self-pay | Admitting: Oncology

## 2024-02-24 ENCOUNTER — Inpatient Hospital Stay: Admitting: Oncology

## 2024-02-24 VITALS — BP 101/70 | HR 93 | Temp 97.6°F | Resp 18 | Wt 232.9 lb

## 2024-02-24 VITALS — BP 108/58 | HR 75

## 2024-02-24 DIAGNOSIS — Z5112 Encounter for antineoplastic immunotherapy: Secondary | ICD-10-CM | POA: Diagnosis not present

## 2024-02-24 DIAGNOSIS — J328 Other chronic sinusitis: Secondary | ICD-10-CM

## 2024-02-24 DIAGNOSIS — Z171 Estrogen receptor negative status [ER-]: Secondary | ICD-10-CM

## 2024-02-24 DIAGNOSIS — Z5181 Encounter for therapeutic drug level monitoring: Secondary | ICD-10-CM

## 2024-02-24 DIAGNOSIS — D649 Anemia, unspecified: Secondary | ICD-10-CM

## 2024-02-24 DIAGNOSIS — C50912 Malignant neoplasm of unspecified site of left female breast: Secondary | ICD-10-CM

## 2024-02-24 DIAGNOSIS — Z5111 Encounter for antineoplastic chemotherapy: Secondary | ICD-10-CM | POA: Diagnosis not present

## 2024-02-24 DIAGNOSIS — Z79899 Other long term (current) drug therapy: Secondary | ICD-10-CM | POA: Diagnosis not present

## 2024-02-24 LAB — CMP (CANCER CENTER ONLY)
ALT: 26 U/L (ref 0–44)
AST: 29 U/L (ref 15–41)
Albumin: 3.7 g/dL (ref 3.5–5.0)
Alkaline Phosphatase: 170 U/L — ABNORMAL HIGH (ref 38–126)
Anion gap: 10 (ref 5–15)
BUN: 10 mg/dL (ref 8–23)
CO2: 25 mmol/L (ref 22–32)
Calcium: 9.5 mg/dL (ref 8.9–10.3)
Chloride: 104 mmol/L (ref 98–111)
Creatinine: 0.82 mg/dL (ref 0.44–1.00)
GFR, Estimated: >60 mL/min
Glucose, Bld: 122 mg/dL — ABNORMAL HIGH (ref 70–99)
Potassium: 4 mmol/L (ref 3.5–5.1)
Sodium: 140 mmol/L (ref 135–145)
Total Bilirubin: 0.2 mg/dL (ref 0.0–1.2)
Total Protein: 7.4 g/dL (ref 6.5–8.1)

## 2024-02-24 LAB — MAGNESIUM: Magnesium: 1.6 mg/dL — ABNORMAL LOW (ref 1.7–2.4)

## 2024-02-24 LAB — CBC WITH DIFFERENTIAL (CANCER CENTER ONLY)
Abs Immature Granulocytes: 0.06 K/uL (ref 0.00–0.07)
Basophils Absolute: 0.1 K/uL (ref 0.0–0.1)
Basophils Relative: 1 %
Eosinophils Absolute: 0.2 K/uL (ref 0.0–0.5)
Eosinophils Relative: 2 %
HCT: 35.2 % — ABNORMAL LOW (ref 36.0–46.0)
Hemoglobin: 10.9 g/dL — ABNORMAL LOW (ref 12.0–15.0)
Immature Granulocytes: 1 %
Lymphocytes Relative: 23 %
Lymphs Abs: 1.7 K/uL (ref 0.7–4.0)
MCH: 27 pg (ref 26.0–34.0)
MCHC: 31 g/dL (ref 30.0–36.0)
MCV: 87.1 fL (ref 80.0–100.0)
Monocytes Absolute: 0.6 K/uL (ref 0.1–1.0)
Monocytes Relative: 9 %
Neutro Abs: 4.8 K/uL (ref 1.7–7.7)
Neutrophils Relative %: 64 %
Platelet Count: 280 K/uL (ref 150–400)
RBC: 4.04 MIL/uL (ref 3.87–5.11)
RDW: 16.1 % — ABNORMAL HIGH (ref 11.5–15.5)
WBC Count: 7.4 K/uL (ref 4.0–10.5)
nRBC: 0 % (ref 0.0–0.2)

## 2024-02-24 MED ORDER — SODIUM CHLORIDE 0.9 % IV SOLN
150.0000 mg | Freq: Once | INTRAVENOUS | Status: AC
  Administered 2024-02-24: 150 mg via INTRAVENOUS
  Filled 2024-02-24 (×2): qty 5
  Filled 2024-02-24: qty 150

## 2024-02-24 MED ORDER — DIPHENHYDRAMINE HCL 25 MG PO TABS
50.0000 mg | ORAL_TABLET | Freq: Once | ORAL | Status: AC
  Administered 2024-02-24: 50 mg via ORAL
  Filled 2024-02-24: qty 2

## 2024-02-24 MED ORDER — DEXTROSE 5 % IV SOLN
INTRAVENOUS | Status: DC
  Filled 2024-02-24: qty 250

## 2024-02-24 MED ORDER — PALONOSETRON HCL INJECTION 0.25 MG/5ML
0.2500 mg | Freq: Once | INTRAVENOUS | Status: AC
  Administered 2024-02-24: 0.25 mg via INTRAVENOUS
  Filled 2024-02-24: qty 5

## 2024-02-24 MED ORDER — FAM-TRASTUZUMAB DERUXTECAN-NXKI CHEMO 100 MG IV SOLR
5.4000 mg/kg | Freq: Once | INTRAVENOUS | Status: AC
  Administered 2024-02-24: 600 mg via INTRAVENOUS
  Filled 2024-02-24: qty 30

## 2024-02-24 MED ORDER — ACETAMINOPHEN 325 MG PO TABS
650.0000 mg | ORAL_TABLET | Freq: Once | ORAL | Status: AC
  Administered 2024-02-24: 650 mg via ORAL
  Filled 2024-02-24: qty 2

## 2024-02-24 MED ORDER — DEXAMETHASONE SOD PHOSPHATE PF 10 MG/ML IJ SOLN
10.0000 mg | Freq: Once | INTRAMUSCULAR | Status: AC
  Administered 2024-02-24: 10 mg via INTRAVENOUS
  Filled 2024-02-24: qty 1

## 2024-02-24 NOTE — Assessment & Plan Note (Signed)
Hemoglobin is stable. Monitor counts.

## 2024-02-24 NOTE — Progress Notes (Signed)
 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  S/p adjuvant Transtuzumab and Pertuzumab  11 cycles. S/p adjuvant radiation-  Declined adjuvant Zometa. Developed recurrent Stage IV breast cancer. 08/27/2023 left breast skin punch biopsy showed ER+ PR + HER2 (3+) breast carcinoma 09/08/2023, right axillary lymph node biopsy showed .  ER- PR-  HER2 (3+)  breast carcinoma   Interval PET scan November 2025 showed chemo response Labs are reviewed and discussed with patient.   November 2025 echocardiogram stable LVEF Proceed with Enhertu    Chronic sinusitis S/p a course of Augmentin .  Recommend ENT evaluation  Encounter for antineoplastic chemotherapy Treatment plan as listed above  Hypomagnesemia continue Slow Mag  to 1 tab BID    Normocytic anemia Hemoglobin is stable. Monitor counts.      Orders Placed This Encounter  Procedures   ECHOCARDIOGRAM COMPLETE    Standing Status:   Future    Expected Date:   03/02/2024    Expiration Date:   02/23/2025    Where should this test be performed:   [facility]    Perflutren DEFINITY (image enhancing agent) should be administered unless hypersensitivity or allergy exist:   Administer Perflutren    Reason for exam-Echo:   Chemo  Z09   Follow up 2 weeks  We spent sufficient time to discuss many aspect of care, questions were answered to patient's satisfaction.  Call Babara, MD, PhD [facility]  Health Hematology Oncology 02/24/2024      HISTORY OF PRESENTING ILLNESS:  Vanessa Fisher is a  64 y.o.  female presents for follow up of Stage IV metastatic breast cancer.  SUMMARY OF ONCOLOGIC HISTORY: Oncology History  Breast cancer (HCC)  07/31/2021 Imaging   Bilateral diagnostic mammogram and US  showed 5 centimeter LEFT breast mass associated with pleomorphic calcifications is suspicious for invasive ductal carcinoma.At least 4 LEFT axillary lymph nodes with abnormal morphology   08/21/2021 Cancer Staging   Staging form: Breast, AJCC 8th Edition - Clinical: Stage IIIB (cT4b, cN3c, cM0, G3, ER-, PR-, HER2+) - Signed by [name], MD on 08/28/2021 Histologic grading system: 3 grade system  -08/21/21 left breast ultrasound-guided biopsy showed invasive mammary carcinoma, grade 3, ER/PR negative, HER2 positive.  Left axillary lymph node biopsy positive for macro metastatic mammary carcinoma, 8 mm in greatest extent.   08/30/2021 Imaging   MRI brain w wo contrast -No metastatic disease or acute intracranial abnormality. Essentially normal for age MRI appearance of the brain.    09/03/2021 Echocardiogram   1. Left ventricular ejection fraction, by estimation, is 55 to 60%. Left ventricular ejection fraction by 3D volume is 58 %. The left ventricle has normal function. The left ventricle has no regional wall motion abnormalities. There is mild left  ventricular hypertrophy. Left ventricular diastolic parameters were normal.  2. Right ventricular systolic function is normal. The right ventricular size is normal.  3. The mitral valve is normal in structure. No evidence of mitral valve regurgitation.  4. The aortic valve was not well visualized. Aortic valve regurgitation is not visualized   09/10/2021 Imaging   PET scan showed Large hypermetabolic left breast mass and diffuse skin thickening,consistent with primary breast carcinoma. Hypermetabolic lymphadenopathy in the left axilla, left  subpectoral region, and left supraclavicular region, consistent with metastatic disease. No evidence of metastatic disease within the abdomen or pelvis   09/12/2021 - 02/13/2022 Chemotherapy   Patient is on Treatment Plan : BREAST  Docetaxel  + Carboplatin  + Trastuzumab  + Pertuzumab   (TCHP) q21d       Genetic Testing   Negative genetic testing. No pathogenic variants identified on the [facility] Common Hereditary Cancers+RNA panel. The report date is 10/26/2021.  The Common Hereditary Cancers Panel + RNA offered by [facility] includes sequencing and/or deletion duplication testing of the following 47 genes: APC, ATM, AXIN2, BARD1, BMPR1A, BRCA1, BRCA2, BRIP1, CDH1, CDKN2A (p14ARF), CDKN2A (p16INK4a), CKD4, CHEK2, CTNNA1, DICER1, EPCAM (Deletion/duplication testing only), GREM1 (promoter region deletion/duplication testing only), KIT, MEN1, MLH1, MSH2, MSH3, MSH6, MUTYH, NBN, NF1, NHTL1, PALB2, PDGFRA, PMS2, POLD1, POLE, PTEN, RAD50, RAD51C, RAD51D, SDHB, SDHC, SDHD, SMAD4, SMARCA4. STK11, TP53, TSC1, TSC2, and VHL.  The following genes were evaluated for sequence changes only: SDHA and HOXB13 c.251G>A variant only.   02/09/2022 Echocardiogram   LVEF 55 to 60%    03/27/2022 Surgery   S/p left mastectomy + SLNB ypT0 ypN0,    04/24/2022 - 12/23/2022 Chemotherapy   Patient is on Treatment Plan : BREAST Trastuzumab   + Pertuzumab  q21d x 13 cycles     05/06/2022 Echocardiogram   LVEF 55 to 60%    07/02/2022 - 08/03/2022 Radiation Therapy   Adjuvant breast radiation.    10/20/2022 Imaging   Bone scan whole body showed 1. Degenerative uptake at the sternoclavicular joints, shoulders,knees and hindfoot bilaterally. 2. No evidence for metastatic disease to the. 3. Uptake in the left breast may be related to postsurgical change or residual tumor.   07/28/2023 Mammogram   Unilateral left breast mammogram showed  1. Diffuse MEDIAL LEFT breast erythema and pain without interval change in mammographic appearance or  suspicious sonographic abnormality. Stable skin thickening in this area is presumably due to prior radiation therapy. Findings are most suspicious for cellulitis. Recommend treatment with antibiotics. If antibiotics fail to resolve the erythema, surgical or dermatologic consultation for skin punch biopsy is recommended to exclude inflammatory breast cancer.   2. Redemonstrated LEFT breast seroma at the lumpectomy site, now with complex internal debris. Patient requests aspiration of the fluid component of this collection, which will be attempted under ultrasound guidance.   08/27/2023 Relapse/Recurrence   08/27/2023, left breast 11:00 punch biopsy showed 1. Breast, biopsy, left 11:00 punch BX :       - INVASIVE GRADE 3 MAMMARY CARCINOMA  INVADING SKIN AND EPIDERMIS WITH       ULCERATION  ER 70% positive, PR 0%, HER2 positive(3+).    09/08/2023 right axilla lymph node biopsy showed 1. Lymph node, needle/core biopsy, right axilla, hydromark coil clip :       ONE LYMPH NODE, POSITIVE FOR METASTATIC CARCINOMA MORPHOLOGICALLY COMPATIBLE       WITH BREAST PRIMARY (1/1).       METASTATIC FOCUS: 11 MM / 1.1 CM.       EXTRANODAL EXTENSION IDENTIFIE    09/01/2023 Imaging   MRI breast bilaterally without contrast showed  1. Widespread abnormal enhancement involving the skin and nipple of the LEFT breast, involving all 4 quadrants and compatible with known, biopsy  proven LEFT breast cancer invading the skin. There is likely slight RETROAREOLAR extension of the nipple involvement extending 8 mm posteriorly from the nipple.   2. Post treatment changes of the LEFT breast without other evidence of recurrent malignancy in the LEFT breast. Thin peripheral enhancement at the lumpectomy site is nonspecific but favored to be due to recent instrumentation.   3. Markedly enlarged RIGHT axillary level 1 lymph nodes, for which second-look ultrasound and ultrasound-guided biopsy is recommended. Small  but asymmetric RIGHT axillary level 2 lymph nodes are also suspicious for malignancy but not amenable to percutaneous sampling.   4. Indeterminate RIGHT breast focus at the 9 o'clock periareolar position anterior depth measuring 3 mm and indeterminate RIGHT breast mass at the 9 o'clock central position at middle depth measuring 5 mm. If management would be impacted, MRI guided biopsies of the focus and mass at 9 o'clock in the RIGHT breast is recommended.   5. Confluent enhancement along the LEFT internal mammary vessels which could reflect ectatic vascular structures or an enlarged LEFT internal mammary lymph node. Recommend attention on forthcoming PET-CT.     09/10/2023 - 09/10/2023 Chemotherapy   Patient is on Treatment Plan : BREAST Fam-Trastuzumab Deruxtecan-nxki  (Enhertu ) (5.4) q21d     09/10/2023 Imaging   PET scan showed  1. Significant hypermetabolism involving skin thickening of the left breast laterally worrisome for recurrent breast cancer. 2. Extensive hypermetabolic right axillary and subpectoral lymphadenopathy consistent with metastatic nodal disease. 3. Hypermetabolic left internal mammary lymphadenopathy. 4. No findings for abdominal/pelvic metastatic disease or osseous metastatic disease. 5. Large fluid collection in the left breast containing gas suggesting a postoperative fluid collection or abscess. 6. Benign hypermetabolic/brown fat in the neck and chest area.   09/27/2023 -  Chemotherapy   Patient is on Treatment Plan : BREAST Fam-Trastuzumab Deruxtecan-nxki  (Enhertu ) (5.4) q21d     12/15/2023 Imaging   1. Interval resolution of gas in the left breast fluid collection, now measuring 5.7 x 4.4 cm. 2. Improved cutaneous thickening in the left breast, with current regional maximum SUV 4.5, previously 17.0. 3. Notable reduction in size and activity of right axillary adenopathy without complete resolution, with index node 0.9 cm in short axis and maximum SUV  5.6, previously 1.8 cm with maximum SUV 29.3. 4. Previous left internal mammary adenopathy has resolved. 5. Faintly accentuated activity in the vicinity of the left 2nd rib posterolaterally without CT correlate, favored to represent intercostal muscular activity rather than a true rib lesion, maximum SUV 4.9. Surveillance of this region suggested.    + chronic nasal pressure, congestion, horse voice she attributes to sinusitis.   INTERVAL HISTORY Vanessa Fisher is a 64 y.o. female who has above history reviewed by me today presents for follow up visit for recurrent HER2 positive breast cancer   Discussed the use of AI scribe software for clinical note transcription with the patient, who gave verbal consent to proceed.   She feels much better today. Has not established with ENT yet, upcoming appointment.  She denies dizziness, nausea vomiting or diarrhea.   MEDICAL HISTORY:  Past Medical History:  Diagnosis Date   Anemia    Anxiety    Arthritis    Asthma    WELL CONTROLLED   Bile acid malabsorption syndrome    Bradycardia    HAD AN ISSUE WITH THIS IN 2016-NO PROBLEMS SINCE   Breast cancer, left breast (HCC) 08/2021   Carpal tunnel syndrome on right    Depression  Diabetes mellitus without complication (HCC)    Diverticulitis    Gastric reflux    GERD (gastroesophageal reflux disease)    Hand pain 05/12/2016   Headache    H/O MIGRAINES   History of kidney stones    H/O   Lumbar radiculitis    Neck pain, chronic    Osteoarthritis of both knees    Panic attacks    Personal history of chemotherapy    Personal history of radiation therapy     SURGICAL HISTORY: Past Surgical History:  Procedure Laterality Date   BREAST BIOPSY Left 08/21/2021   Axilla Bx, Hydromarker, neg   BREAST BIOPSY Left 08/21/2021   US  Bx, Ribbon Clip, invasive mammary carcinoma   BREAST BIOPSY Left 03/12/2022   US  LT RADIO FREQUENCY TAG LOC US  GUIDE 03/12/2022 [facility]   BREAST  BIOPSY Left 03/12/2022   US  LT RADIO FREQUENCY TAG EA ADD LESION LOC US  GUIDE 03/12/2022 [facility]   BREAST LUMPECTOMY Left 03/27/2022   CARPAL TUNNEL RELEASE Right 12/06/2017   Procedure: CARPAL TUNNEL RELEASE;  Surgeon: Cleotilde Barrio, MD;  Location: [facility];  Service: Orthopedics;  Laterality: Right;   COLONOSCOPY WITH PROPOFOL  N/A 06/11/2021   Procedure: COLONOSCOPY WITH PROPOFOL ;  Surgeon: Unk Corinn Skiff, MD;  Location: [facility];  Service: Gastroenterology;  Laterality: N/A;   DORSAL COMPARTMENT RELEASE Right 12/06/2017   Procedure: RELEASE DORSAL COMPARTMENT (DEQUERVAIN);  Surgeon: Cleotilde Barrio, MD;  Location: [facility];  Service: Orthopedics;  Laterality: Right;   ESOPHAGOGASTRODUODENOSCOPY (EGD) WITH PROPOFOL  N/A 06/11/2021   Procedure: ESOPHAGOGASTRODUODENOSCOPY (EGD) WITH PROPOFOL ;  Surgeon: Unk Corinn Skiff, MD;  Location: [facility];  Service: Gastroenterology;  Laterality: N/A;   INCISION AND DRAINAGE, ABSCESS, BREAST Left 08/27/2023   Procedure: INCISION AND DRAINAGE, ABSCESS, BREAST;  Surgeon: Rodolph Romano, MD;  Location: [facility];  Service: General;  Laterality: Left;   PART MASTECTOMY,RADIO FREQUENCY LOCALIZER,AXILLARY SENTINEL NODE BIOPSY Left 03/27/2022   Procedure: PART MASTECTOMY,RADIO FREQUENCY LOCALIZER,AXILLARY SENTINEL NODE BIOPSY;  Surgeon: Rodolph Romano, MD;  Location: [facility];  Service: General;  Laterality: Left;   PARTIAL HYSTERECTOMY     PORTACATH PLACEMENT N/A 09/24/2021   Procedure: INSERTION PORT-A-CATH;  Surgeon: Rodolph Romano, MD;  Location: [facility];  Service: General;  Laterality: N/A;   TUBAL LIGATION      SOCIAL HISTORY: Social History   Socioeconomic History   Marital status: Single    Spouse name: Not on file   Number of children: Not on file   Years of education: Not on file   Highest education level: Not on file  Occupational History   Not on file  Tobacco Use   Smoking status: Former     Types: Cigarettes   Smokeless tobacco: Never  Vaping Use   Vaping status: Never Used  Substance and Sexual Activity   Alcohol use: No   Drug use: No   Sexual activity: Not on file  Other Topics Concern   Not on file  Social History Narrative   Lives alone   Social Drivers of Health   Tobacco Use: Medium Risk (02/24/2024)   Patient History    Smoking Tobacco Use: Former    Smokeless Tobacco Use: Never    Passive Exposure: Not on file  Financial Resource Strain: Low Risk  (10/20/2023)   Received from [facility]   Overall Financial Resource Strain (CARDIA)    Difficulty of Paying Living Expenses: Not hard at all  Food Insecurity: Food Insecurity Present (10/20/2023)   Received from [facility]  [employer]   Epic    Within the past 12 months, you worried that your food would run out before you got the money to buy more.: Never true    Within the past 12 months, the food you bought just didn't last and you didn't have money to get more.: Sometimes true  Transportation Needs: No Transportation Needs (10/20/2023)   Received from [facility] - Transportation    In the past 12 months, has lack of transportation kept you from medical appointments or from getting medications?: No    Lack of Transportation (Non-Medical): No  Recent Concern: Transportation Needs - Unmet Transportation Needs (10/08/2023)   Received from [facility] - Transportation    In the past 12 months, has lack of transportation kept you from medical appointments or from getting medications?: Yes    Lack of Transportation (Non-Medical): Yes  Physical Activity: Inactive (08/15/2021)   Exercise Vital Sign    Days of Exercise per Week: 0 days    Minutes of Exercise per Session: 0 min  Stress: Stress Concern Present (08/15/2021)   [employer] of Occupational Health - Occupational Stress Questionnaire    Feeling of Stress : Rather much   Social Connections: Socially Isolated (08/15/2021)   Social Connection and Isolation Panel    Frequency of Communication with Friends and Family: Once a week    Frequency of Social Gatherings with Friends and Family: Once a week    Attends Religious Services: Never    Database Administrator or Organizations: No    Attends Engineer, Structural: Not on file    Marital Status: Never married  Intimate Partner Violence: Not At Risk (08/15/2021)   Humiliation, Afraid, Rape, and Kick questionnaire    Fear of Current or Ex-Partner: No    Emotionally Abused: No    Physically Abused: No    Sexually Abused: No  Depression (PHQ2-9): Low Risk (02/01/2024)   Depression (PHQ2-9)    PHQ-2 Score: 0  Alcohol Screen: Not on file  Housing: Low Risk  (10/20/2023)   Received from [facility]   Epic    In the last 12 months, was there a time when you were not able to pay the mortgage or rent on time?: No    In the past 12 months, how many times have you moved where you were living?: 0    At any time in the past 12 months, were you homeless or living in a shelter (including now)?: No  Recent Concern: Housing - High Risk (10/08/2023)   Received from [facility]   Epic    In the last 12 months, was there a time when you were not able to pay the mortgage or rent on time?: No    In the past 12 months, how many times have you moved where you were living?: 0    At any time in the past 12 months, were you homeless or living in a shelter (including now)?: Yes  Utilities: Not At Risk (10/20/2023)   Received from [facility]   Epic    In the past 12 months has the electric, gas, oil, or water  company threatened to shut off services in your home?: No  Recent Concern: Utilities - At Risk (10/08/2023)   Received from [facility]   Epic    In the past 12 months has the electric, gas,  oil, or water  company threatened to shut off services in  your home?: Yes  Health Literacy: Not on file    FAMILY HISTORY: Family History  Problem Relation Age of Onset   Diabetes Mother    Hypertension Mother    Heart failure Mother    Pancreatic cancer Mother 56   Diabetes Father    Hypertension Father    Breast cancer Maternal Aunt    Cervical cancer Maternal Aunt    Lung cancer Son 61    ALLERGIES:  is allergic to bc powder [aspirin-salicylamide-caffeine], doxycycline , gabapentin , hydrocodone -acetaminophen , penicillin g, penicillins, and shellfish allergy.  MEDICATIONS:  Current Outpatient Medications  Medication Sig Dispense Refill   albuterol  (PROVENTIL ) (2.5 MG/3ML) 0.083% nebulizer solution Take 3 mLs (2.5 mg total) by nebulization every 4 (four) hours as needed for wheezing or shortness of breath. 75 mL 2   albuterol  (VENTOLIN  HFA) 108 (90 Base) MCG/ACT inhaler Inhale 2 puffs into the lungs every 6 (six) hours as needed for wheezing or shortness of breath. 18 g 0   allopurinol (ZYLOPRIM) 100 MG tablet Take 100 mg by mouth daily.     atorvastatin (LIPITOR) 10 MG tablet Take 10 mg by mouth daily.     Azelastine  HCl 137 MCG/SPRAY SOLN Place 1 spray into the nose daily. 30 mL 0   calcium  carbonate (OS-CAL - DOSED IN MG OF ELEMENTAL CALCIUM ) 1250 (500 Ca) MG tablet Take 1 tablet (1,250 mg total) by mouth daily. 90 tablet 1   celecoxib (CELEBREX) 200 MG capsule Take by mouth 2 (two) times daily.     cetirizine  (ZYRTEC  ALLERGY) 10 MG tablet Take 1 tablet (10 mg total) by mouth daily. 90 tablet 0   cyclobenzaprine  (FLEXERIL ) 10 MG tablet Take 10 mg by mouth 3 (three) times daily.     dexamethasone  (DECADRON ) 4 MG tablet Take 2 tablets (8 mg) by mouth daily for 3 days starting the day after chemotherapy. Take with food. 30 tablet 1   diclofenac Sodium (VOLTAREN) 1 % GEL Apply topically.     diphenoxylate -atropine  (LOMOTIL ) 2.5-0.025 MG tablet Take 1 tablet by mouth 4 (four) times daily as needed for diarrhea or loose stools. 120 tablet 0    docusate sodium  (COLACE) 100 MG capsule Take 1 capsule (100 mg total) by mouth 2 (two) times daily.     escitalopram  (LEXAPRO ) 10 MG tablet Take 1 tablet (10 mg total) by mouth daily. 30 tablet 1   fluticasone  (FLONASE ) 50 MCG/ACT nasal spray Place 2 sprays into both nostrils daily. 15.8 mL 0   KLOR-CON  M20 20 MEQ tablet TAKE 1 TABLET BY MOUTH TWICE A DAY 60 tablet 1   lidocaine -prilocaine  (EMLA ) cream Apply 1 Application topically as needed. Apply small amount to port and cover with saran wrap 1-2 hours prior to port access 30 g 12   loperamide  (IMODIUM ) 2 MG capsule TAKE 4 MG (2 TABLETS) AT FIRST LOOSE STOOL THEN 2 MG (1 TABLET) EVERY 2 TO 4 HOURS FOR EACH SUBSEQUENT LOOSE STOOL. DO NOT EXCEED 8 TABLETS IN 24 HOUR PERIOD. 120 capsule 1   losartan (COZAAR) 25 MG tablet Take 25 mg by mouth daily.     magnesium  chloride (SLOW-MAG) 64 MG TBEC SR tablet Take 1 tablet (64 mg total) by mouth 2 (two) times daily. 60 tablet 2   metoprolol succinate (TOPROL-XL) 25 MG 24 hr tablet Take 25 mg by mouth daily.     naloxone (NARCAN) nasal spray 4 mg/0.1 mL      ondansetron  (  ZOFRAN ) 8 MG tablet Take 1 tablet (8 mg total) by mouth every 8 (eight) hours as needed for nausea or vomiting. Start on the third day after chemotherapy. 30 tablet 1   oxyCODONE  HCl 10 MG TABA Take 10 mg by mouth every 4 (four) hours as needed. (Patient taking differently: Take 10 mg by mouth every 4 (four) hours as needed. Patient is taking every three hours as needed.) 14 tablet 0   pantoprazole (PROTONIX) 20 MG tablet Take 20 mg by mouth daily.     pregabalin (LYRICA) 50 MG capsule Take 50 mg by mouth 3 (three) times daily.     prochlorperazine  (COMPAZINE ) 10 MG tablet Take 1 tablet (10 mg total) by mouth every 6 (six) hours as needed for nausea or vomiting. 30 tablet 1   amoxicillin -clavulanate (AUGMENTIN ) 875-125 MG tablet Take 1 tablet by mouth 2 (two) times daily. (Patient not taking: Reported on 02/24/2024) 14 tablet 0   DULoxetine  (CYMBALTA) 20 MG capsule Take 20 mg by mouth. (Patient not taking: Reported on 02/24/2024)     magic mouthwash (multi-ingredient) oral suspension Swish and swallow 5-10 mLs 4 (four) times daily as needed. (Patient not taking: Reported on 02/24/2024) 480 mL 3   magic mouthwash w/lidocaine  SOLN Take 5 mLs by mouth 4 (four) times daily as needed for mouth pain. (Patient not taking: Reported on 02/24/2024) 250 mL 2   No current facility-administered medications for this visit.   Facility-Administered Medications Ordered in Other Visits  Medication Dose Route Frequency Provider Last Rate Last Admin   magnesium  sulfate IVPB 2 g 50 mL  2 g Intravenous Once Covington, Sarah M, PA-C        Review of Systems  Constitutional:  Negative for appetite change, chills, fatigue and fever.  HENT:   Negative for hearing loss and voice change.        Nasal congestion, facial pain  Eyes:  Negative for eye problems.  Respiratory:  Negative for chest tightness and cough.   Cardiovascular:  Negative for chest pain and leg swelling.  Gastrointestinal:  Negative for abdominal distention, abdominal pain, blood in stool and diarrhea.  Endocrine: Negative for hot flashes.  Genitourinary:  Negative for difficulty urinating, dysuria and frequency.   Musculoskeletal:  Positive for arthralgias and back pain.       Leg cramps  Skin:  Negative for itching and rash.  Neurological:  Negative for extremity weakness and headaches.  Hematological:  Negative for adenopathy.  Psychiatric/Behavioral:  Negative for confusion.      PHYSICAL EXAMINATION: ECOG PERFORMANCE STATUS: 1 - Symptomatic but completely ambulatory Vitals:   02/24/24 1107  BP: 101/70  Pulse: 93  Resp: 18  Temp: 97.6 F (36.4 C)  SpO2: 99%   Filed Weights   02/24/24 1107  Weight: 232 lb 14.4 oz (105.6 kg)    Physical Exam Constitutional:      General: She is not in acute distress.    Appearance: She is not diaphoretic.  HENT:     Head:  Normocephalic and atraumatic.  Eyes:     General: No scleral icterus. Cardiovascular:     Rate and Rhythm: Normal rate and regular rhythm.     Heart sounds: No murmur heard. Pulmonary:     Effort: Pulmonary effort is normal. No respiratory distress.     Breath sounds: No wheezing.  Abdominal:     General: There is no distension.     Palpations: Abdomen is soft.     Tenderness: There  is no abdominal tenderness.  Musculoskeletal:        General: Normal range of motion.     Cervical back: Normal range of motion and neck supple.     Right lower leg: No edema.     Left lower leg: No edema.  Skin:    General: Skin is warm and dry.     Findings: No erythema.  Neurological:     Mental Status: She is alert and oriented to person, place, and time.     Cranial Nerves: No cranial nerve deficit.     Motor: No abnormal muscle tone.     Coordination: Coordination normal.  Psychiatric:        Mood and Affect: Affect normal.     Comments:     Left breast tenderness with palpation has resolved.  Palpable the left breast mass, previously open wound has healed.   LABORATORY DATA:  I have reviewed the data as listed    Latest Ref Rng & Units 02/24/2024   10:56 AM 02/10/2024   10:35 AM 02/01/2024   10:45 AM  CBC  WBC 4.0 - 10.5 K/uL 7.4  7.4  5.1   Hemoglobin 12.0 - 15.0 g/dL 89.0  89.8  9.5   Hematocrit 36.0 - 46.0 % 35.2  33.4  30.7   Platelets 150 - 400 K/uL 280  302  338       Latest Ref Rng & Units 02/24/2024   10:56 AM 02/10/2024   10:35 AM 02/01/2024   10:45 AM  CMP  Glucose 70 - 99 mg/dL 877  886  897   BUN 8 - 23 mg/dL 10  12  9    Creatinine 0.44 - 1.00 mg/dL 9.17  9.07  9.13   Sodium 135 - 145 mmol/L 140  141  141   Potassium 3.5 - 5.1 mmol/L 4.0  3.9  4.2   Chloride 98 - 111 mmol/L 104  106  106   CO2 22 - 32 mmol/L 25  24  25    Calcium  8.9 - 10.3 mg/dL 9.5  9.1  8.9   Total Protein 6.5 - 8.1 g/dL 7.4  6.9  6.3   Total Bilirubin 0.0 - 1.2 mg/dL 0.2  <9.7  <9.7   Alkaline  Phos 38 - 126 U/L 170  130  115   AST 15 - 41 U/L 29  26  25    ALT 0 - 44 U/L 26  19  16        RADIOGRAPHIC STUDIES: I have personally reviewed the radiological images as listed and agreed with the findings in the report. No results found.      "

## 2024-02-24 NOTE — Assessment & Plan Note (Signed)
 Left breast cancer, cT4b N3 Mx, ER/PR-, HER2 + S/p neoadjuvant chemotherapy 6 cycles of TCHP  S/p left lumpectomy  + SLNB -ypT0 ypN0, complete pathological response.  S/p adjuvant Transtuzumab and Pertuzumab  11 cycles. S/p adjuvant radiation-  Declined adjuvant Zometa. Developed recurrent Stage IV breast cancer. 08/27/2023 left breast skin punch biopsy showed ER+ PR + HER2 (3+) breast carcinoma 09/08/2023, right axillary lymph node biopsy showed .  ER- PR-  HER2 (3+)  breast carcinoma   Interval PET scan November 2025 showed chemo response Labs are reviewed and discussed with patient.   November 2025 echocardiogram stable LVEF Proceed with Enhertu 

## 2024-02-24 NOTE — Assessment & Plan Note (Signed)
 continue Slow Mag  to 1 tab BID

## 2024-02-24 NOTE — Assessment & Plan Note (Signed)
 Treatment plan as listed above.

## 2024-02-24 NOTE — Assessment & Plan Note (Signed)
 S/p a course of Augmentin .  Recommend ENT evaluation

## 2024-02-24 NOTE — Patient Instructions (Signed)
 [facility] - A DEPT OF [facility]  Discharge Instructions: Thank you for choosing [facility] to provide your oncology and hematology care.  If you have a lab appointment with the Cancer Center, please go directly to the Cancer Center and check in at the registration area.  Wear comfortable clothing and clothing appropriate for easy access to any Portacath or PICC line.   We strive to give you quality time with your provider. You may need to reschedule your appointment if you arrive late (15 or more minutes).  Arriving late affects you and other patients whose appointments are after yours.  Also, if you miss three or more appointments without notifying the office, you may be dismissed from the clinic at the provider's discretion.      For prescription refill requests, have your pharmacy contact our office and allow 72 hours for refills to be completed.    Today you received the following chemotherapy and/or immunotherapy agents- enhertu       To help prevent nausea and vomiting after your treatment, we encourage you to take your nausea medication as directed.  BELOW ARE SYMPTOMS THAT SHOULD BE REPORTED IMMEDIATELY: *FEVER GREATER THAN 100.4 F (38 C) OR HIGHER *CHILLS OR SWEATING *NAUSEA AND VOMITING THAT IS NOT CONTROLLED WITH YOUR NAUSEA MEDICATION *UNUSUAL SHORTNESS OF BREATH *UNUSUAL BRUISING OR BLEEDING *URINARY PROBLEMS (pain or burning when urinating, or frequent urination) *BOWEL PROBLEMS (unusual diarrhea, constipation, pain near the anus) TENDERNESS IN MOUTH AND THROAT WITH OR WITHOUT PRESENCE OF ULCERS (sore throat, sores in mouth, or a toothache) UNUSUAL RASH, SWELLING OR PAIN  UNUSUAL VAGINAL DISCHARGE OR ITCHING   Items with * indicate a potential emergency and should be followed up as soon as possible or go to the Emergency Department if any problems should occur.  Please show the CHEMOTHERAPY ALERT CARD or IMMUNOTHERAPY  ALERT CARD at check-in to the Emergency Department and triage nurse.  Should you have questions after your visit or need to cancel or reschedule your appointment, please contact [facility] - A DEPT OF [facility]  [phone] and follow the prompts.  Office hours are 8:00 a.m. to 4:30 p.m. Monday - Friday. Please note that voicemails left after 4:00 p.m. may not be returned until the following business day.  We are closed weekends and major holidays. You have access to a nurse at all times for urgent questions. Please call the main number to the clinic [phone] and follow the prompts.  For any non-urgent questions, you may also contact your provider using MyChart. We now offer e-Visits for anyone 50 and older to request care online for non-urgent symptoms. For details visit [email or web address].   Also download the MyChart app! Go to the app store, search MyChart, open the app, select [facility], and log in with your MyChart username and password.

## 2024-02-25 LAB — CANCER ANTIGEN 15-3: CA 15-3: 22.1 U/mL (ref 0.0–25.0)

## 2024-02-25 LAB — CANCER ANTIGEN 27.29: CA 27.29: 22.5 U/mL (ref 0.0–38.6)

## 2024-02-27 ENCOUNTER — Other Ambulatory Visit: Payer: Self-pay | Admitting: Nurse Practitioner

## 2024-02-27 DIAGNOSIS — E876 Hypokalemia: Secondary | ICD-10-CM

## 2024-03-01 ENCOUNTER — Inpatient Hospital Stay

## 2024-03-01 ENCOUNTER — Inpatient Hospital Stay: Admitting: Oncology

## 2024-03-03 ENCOUNTER — Other Ambulatory Visit: Payer: Self-pay

## 2024-03-03 ENCOUNTER — Emergency Department
Admission: EM | Admit: 2024-03-03 | Discharge: 2024-03-04 | Disposition: A | Attending: Emergency Medicine | Admitting: Emergency Medicine

## 2024-03-03 DIAGNOSIS — Z853 Personal history of malignant neoplasm of breast: Secondary | ICD-10-CM | POA: Insufficient documentation

## 2024-03-03 DIAGNOSIS — R112 Nausea with vomiting, unspecified: Secondary | ICD-10-CM

## 2024-03-03 DIAGNOSIS — R109 Unspecified abdominal pain: Secondary | ICD-10-CM

## 2024-03-03 DIAGNOSIS — N390 Urinary tract infection, site not specified: Secondary | ICD-10-CM | POA: Insufficient documentation

## 2024-03-03 LAB — CBC
HCT: 38.2 % (ref 36.0–46.0)
Hemoglobin: 11.8 g/dL — ABNORMAL LOW (ref 12.0–15.0)
MCH: 26.5 pg (ref 26.0–34.0)
MCHC: 30.9 g/dL (ref 30.0–36.0)
MCV: 85.8 fL (ref 80.0–100.0)
Platelets: 137 10*3/uL — ABNORMAL LOW (ref 150–400)
RBC: 4.45 MIL/uL (ref 3.87–5.11)
RDW: 15.4 % (ref 11.5–15.5)
WBC: 6.6 10*3/uL (ref 4.0–10.5)
nRBC: 0 % (ref 0.0–0.2)

## 2024-03-03 LAB — COMPREHENSIVE METABOLIC PANEL WITH GFR
ALT: 24 U/L (ref 0–44)
AST: 30 U/L (ref 15–41)
Albumin: 3.9 g/dL (ref 3.5–5.0)
Alkaline Phosphatase: 171 U/L — ABNORMAL HIGH (ref 38–126)
Anion gap: 14 (ref 5–15)
BUN: 19 mg/dL (ref 8–23)
CO2: 28 mmol/L (ref 22–32)
Calcium: 9.6 mg/dL (ref 8.9–10.3)
Chloride: 97 mmol/L — ABNORMAL LOW (ref 98–111)
Creatinine, Ser: 1 mg/dL (ref 0.44–1.00)
GFR, Estimated: >60 mL/min
Glucose, Bld: 118 mg/dL — ABNORMAL HIGH (ref 70–99)
Potassium: 3.7 mmol/L (ref 3.5–5.1)
Sodium: 139 mmol/L (ref 135–145)
Total Bilirubin: 0.5 mg/dL (ref 0.0–1.2)
Total Protein: 7.5 g/dL (ref 6.5–8.1)

## 2024-03-03 LAB — TROPONIN T, HIGH SENSITIVITY: Troponin T High Sensitivity: 7 ng/L (ref 0–19)

## 2024-03-03 LAB — RESP PANEL BY RT-PCR (RSV, FLU A&B, COVID)  RVPGX2
Influenza A by PCR: NEGATIVE
Influenza B by PCR: NEGATIVE
Resp Syncytial Virus by PCR: NEGATIVE
SARS Coronavirus 2 by RT PCR: NEGATIVE

## 2024-03-03 NOTE — ED Triage Notes (Signed)
 Pt reports flank pain and hematuria. Seen earlier this week for same and started on abx. Pt has hx of kidney stones and states that this pain feels the same. Endorses emesis and weakness onset today. Pt is actively in chemo for breast cancer and has L arm restriction. Pt ambulatory to triage. Alert and oriented following commands. Breathing unlabored speaking in full sentences. Symmetric cehst rise and fall.

## 2024-03-03 NOTE — Telephone Encounter (Signed)
 Pt was scheduled on 1/27 and no showed to appt. Confirmed with office. Will  need to call and r/s

## 2024-03-04 ENCOUNTER — Emergency Department

## 2024-03-04 LAB — URINALYSIS, ROUTINE W REFLEX MICROSCOPIC
Bilirubin Urine: NEGATIVE
Glucose, UA: NEGATIVE mg/dL
Hgb urine dipstick: NEGATIVE
Ketones, ur: 5 mg/dL — AB
Nitrite: NEGATIVE
Protein, ur: NEGATIVE mg/dL
Specific Gravity, Urine: 1.029 (ref 1.005–1.030)
WBC, UA: >50 WBC/hpf (ref 0–5)
pH: 5 (ref 5.0–8.0)

## 2024-03-04 LAB — LIPASE, BLOOD: Lipase: 32 U/L (ref 11–51)

## 2024-03-04 MED ORDER — ONDANSETRON 4 MG PO TBDP: 4.0000 mg | ORAL_TABLET | Freq: Three times a day (TID) | ORAL | 0 refills | Status: AC | PRN

## 2024-03-04 MED ORDER — ONDANSETRON HCL 4 MG/2ML IJ SOLN
4.0000 mg | Freq: Once | INTRAMUSCULAR | Status: AC
  Administered 2024-03-04: 4 mg via INTRAVENOUS
  Filled 2024-03-04: qty 2

## 2024-03-04 MED ORDER — CEPHALEXIN 500 MG PO CAPS
500.0000 mg | ORAL_CAPSULE | Freq: Once | ORAL | Status: AC
  Administered 2024-03-04: 500 mg via ORAL
  Filled 2024-03-04: qty 1

## 2024-03-04 MED ORDER — MORPHINE SULFATE (PF) 4 MG/ML IV SOLN
4.0000 mg | Freq: Once | INTRAVENOUS | Status: AC
  Administered 2024-03-04: 4 mg via INTRAVENOUS
  Filled 2024-03-04: qty 1

## 2024-03-04 MED ORDER — SODIUM CHLORIDE 0.9 % IV BOLUS
500.0000 mL | Freq: Once | INTRAVENOUS | Status: AC
  Administered 2024-03-04: 500 mL via INTRAVENOUS

## 2024-03-04 MED ORDER — CEPHALEXIN 500 MG PO CAPS: 500.0000 mg | ORAL_CAPSULE | Freq: Two times a day (BID) | ORAL | 0 refills | Status: AC

## 2024-03-04 NOTE — ED Provider Notes (Signed)
 "  [facility] Provider Note    Event Date/Time   First MD Initiated Contact with Patient 03/03/24 2352     (approximate)   History   Flank Pain and Weakness   HPI  Vanessa Fisher is a 64 y.o. female with a history of breast cancer who presents with left flank and abdominal pain along with difficulty urinating.  The patient states that the flank pain has been present for approximately the last week.  She has had associated nausea and vomiting.  She last had chemo 3 days ago.  The patient states that last week and she was seen in urgent care and started on what she believed was an antibiotic, but it is tamsulosin.  She stated that there was blood in her urine so she was diagnosed with a presumed ureteral stone although did not have any imaging.  The patient states that the pain has persisted.  It is mainly on the left side.  She also reports pain to the front of her abdomen across both sides.  She denies any diarrhea.  She reports difficulty urinating but has not noticed any gross hematuria.  She has no dysuria or fever.  I reviewed the past medical records.  The patient's most recent prior encounter in our system was on 1/22 with oncology for follow-up of her cancer.   Physical Exam   Triage Vital Signs: ED Triage Vitals  Encounter Vitals Group     BP 03/03/24 1923 132/76     Girls Systolic BP Percentile --      Girls Diastolic BP Percentile --      Boys Systolic BP Percentile --      Boys Diastolic BP Percentile --      Pulse Rate 03/03/24 1923 85     Resp 03/03/24 1923 18     Temp 03/03/24 1923 97.9 F (36.6 C)     Temp Source 03/03/24 1923 Oral     SpO2 03/03/24 1923 99 %     Weight 03/03/24 1922 223 lb (101.2 kg)     Height 03/03/24 1922 5' 6 (1.676 m)     Head Circumference --      Peak Flow --      Pain Score 03/03/24 1922 8     Pain Loc --      Pain Education --      Exclude from Growth Chart --     Most recent vital signs: Vitals:    03/04/24 0500 03/04/24 0600  BP: (!) 97/53 113/64  Pulse: 64 72  Resp: 17 17  Temp:  97.9 F (36.6 C)  SpO2: 100% 99%     General: Alert, relatively well-appearing, no distress.  CV:  Good peripheral perfusion.  Resp:  Normal effort.  Abd:  Mild left mid abdominal/flank tenderness.  No distention.  Other:  Mild left CVA tenderness.   ED Results / Procedures / Treatments   Labs (all labs ordered are listed, but only abnormal results are displayed) Labs Reviewed  COMPREHENSIVE METABOLIC PANEL WITH GFR - Abnormal; Notable for the following components:      Result Value   Chloride 97 (*)    Glucose, Bld 118 (*)    Alkaline Phosphatase 171 (*)    All other components within normal limits  CBC - Abnormal; Notable for the following components:   Hemoglobin 11.8 (*)    Platelets 137 (*)    All other components within normal limits  URINALYSIS, ROUTINE W REFLEX  MICROSCOPIC - Abnormal; Notable for the following components:   Color, Urine YELLOW (*)    APPearance CLOUDY (*)    Ketones, ur 5 (*)    Leukocytes,Ua LARGE (*)    Bacteria, UA FEW (*)    All other components within normal limits  RESP PANEL BY RT-PCR (RSV, FLU A&B, COVID)  RVPGX2  LIPASE, BLOOD  CBG MONITORING, ED  TROPONIN T, HIGH SENSITIVITY     EKG  ED ECG REPORT I, Waylon Cassis, the attending physician, personally viewed and interpreted this ECG.  Date: 03/03/2024 EKG Time: 1926 Rate: 81 Rhythm: normal sinus rhythm QRS Axis: normal Intervals: normal ST/T Wave abnormalities: normal Narrative Interpretation: no evidence of acute ischemia    RADIOLOGY  CT abdomen/pelvis: I independently viewed and interpreted the images; there are no dilated bowel loops or any free air or free fluid.  Radiology report indicates the following:  IMPRESSION:  1. No acute noncontrast CT abnormality in the abdomen or pelvis, with no  urinary stone or obstruction on the current or prior exams .  2. Stable  sclerotic lesions in the posterior right ilium, proximal right femur,  and right hemisacrum since 2022, favoring benign healed fibrous lesions and a  bone island.  3. Advanced L4-5 facet hypertrophy with grade 1 degenerative anterolisthesis  and acquired spinal stenosis.     PROCEDURES:  Critical Care performed: No  Procedures   MEDICATIONS ORDERED IN ED: Medications  ondansetron  (ZOFRAN ) injection 4 mg (4 mg Intravenous Given 03/04/24 0058)  sodium chloride  0.9 % bolus 500 mL (0 mLs Intravenous Stopped 03/04/24 0224)  morphine  (PF) 4 MG/ML injection 4 mg (4 mg Intravenous Given 03/04/24 0058)  ondansetron  (ZOFRAN ) injection 4 mg (4 mg Intravenous Given 03/04/24 0441)  cephALEXin  (KEFLEX ) capsule 500 mg (500 mg Oral Given by Other 03/04/24 9377)     IMPRESSION / MDM / ASSESSMENT AND PLAN / ED COURSE  I reviewed the triage vital signs and the nursing notes.  64 year old female with PMH as noted above presents with left flank and upper abdominal pain along with nausea and vomiting.  Differential diagnosis includes, but is not limited to, ureteral stone, UTI, pyelonephritis, diverticulitis, colitis, gastritis, gastroparesis, chemotherapy side effects.  CBC shows no leukocytosis and a normal hemoglobin.  CMP is unremarkable.  We will obtain a urinalysis, CT, give a fluid bolus, and reassess.  Patient's presentation is most consistent with acute presentation with potential threat to life or bodily function.  The patient is on the cardiac monitor to evaluate for evidence of arrhythmia and/or significant heart rate changes.  ----------------------------------------- 6:08 AM on 03/04/2024 -----------------------------------------  Urinalysis shows significant WBCs and findings compatible with a UTI.  The CT is negative for ureteral stone or other acute intra-abdominal findings.  The patient did require some additional nausea medication but is now feeling much better.  I counseled her on  the results of the workup.  We will treat her for UTI.  She feels comfortable going home.  She is stable for discharge at this time.  I have prescribed Keflex .  Although she is having some flank pain and is really more upper abdominal pain related to the nausea.  Clinically, there is no evidence of pyelonephritis.  I gave the patient strict return precautions, she expressed understanding.  FINAL CLINICAL IMPRESSION(S) / ED DIAGNOSES   Final diagnoses:  Urinary tract infection without hematuria, site unspecified  Nausea and vomiting, unspecified vomiting type  Abdominal pain, unspecified abdominal location     Rx /  DC Orders   ED Discharge Orders          Ordered    cephALEXin  (KEFLEX ) 500 MG capsule  2 times daily        03/04/24 0609    ondansetron  (ZOFRAN -ODT) 4 MG disintegrating tablet  Every 8 hours PRN        03/04/24 0610             Note:  This document was prepared using Dragon voice recognition software and may include unintentional dictation errors.    Jacolyn Pae, MD 03/04/24 0730  "

## 2024-03-09 ENCOUNTER — Ambulatory Visit: Admission: RE | Admit: 2024-03-09 | Source: Ambulatory Visit

## 2024-03-16 ENCOUNTER — Inpatient Hospital Stay: Admitting: Oncology

## 2024-03-16 ENCOUNTER — Inpatient Hospital Stay

## 2024-04-26 ENCOUNTER — Ambulatory Visit: Admitting: Radiation Oncology

## 2024-05-16 ENCOUNTER — Inpatient Hospital Stay: Admitting: Hospice and Palliative Medicine
